# Patient Record
Sex: Male | Born: 1935
Health system: Southern US, Community
[De-identification: ages and names within clinical notes are randomized; demographics above are authoritative.]

## PROBLEM LIST (undated history)

## (undated) DIAGNOSIS — M1990 Unspecified osteoarthritis, unspecified site: Secondary | ICD-10-CM

## (undated) DIAGNOSIS — K5790 Diverticulosis of intestine, part unspecified, without perforation or abscess without bleeding: Secondary | ICD-10-CM

## (undated) DIAGNOSIS — I4891 Unspecified atrial fibrillation: Secondary | ICD-10-CM

## (undated) DIAGNOSIS — M545 Low back pain, unspecified: Secondary | ICD-10-CM

## (undated) DIAGNOSIS — H8110 Benign paroxysmal vertigo, unspecified ear: Secondary | ICD-10-CM

## (undated) DIAGNOSIS — J309 Allergic rhinitis, unspecified: Secondary | ICD-10-CM

## (undated) DIAGNOSIS — I6529 Occlusion and stenosis of unspecified carotid artery: Secondary | ICD-10-CM

## (undated) DIAGNOSIS — K219 Gastro-esophageal reflux disease without esophagitis: Secondary | ICD-10-CM

## (undated) DIAGNOSIS — Z7901 Long term (current) use of anticoagulants: Secondary | ICD-10-CM

## (undated) DIAGNOSIS — G4700 Insomnia, unspecified: Secondary | ICD-10-CM

## (undated) DIAGNOSIS — I10 Essential (primary) hypertension: Secondary | ICD-10-CM

## (undated) DIAGNOSIS — G8929 Other chronic pain: Secondary | ICD-10-CM

## (undated) DIAGNOSIS — H918X9 Other specified hearing loss, unspecified ear: Secondary | ICD-10-CM

## (undated) DIAGNOSIS — I4892 Unspecified atrial flutter: Secondary | ICD-10-CM

## (undated) DIAGNOSIS — I2510 Atherosclerotic heart disease of native coronary artery without angina pectoris: Secondary | ICD-10-CM

## (undated) DIAGNOSIS — I472 Ventricular tachycardia: Secondary | ICD-10-CM

## (undated) DIAGNOSIS — J189 Pneumonia, unspecified organism: Secondary | ICD-10-CM

## (undated) DIAGNOSIS — F528 Other sexual dysfunction not due to a substance or known physiological condition: Secondary | ICD-10-CM

## (undated) DIAGNOSIS — C439 Malignant melanoma of skin, unspecified: Secondary | ICD-10-CM

## (undated) DIAGNOSIS — E739 Lactose intolerance, unspecified: Secondary | ICD-10-CM

## (undated) DIAGNOSIS — F1011 Alcohol abuse, in remission: Secondary | ICD-10-CM

## (undated) DIAGNOSIS — E785 Hyperlipidemia, unspecified: Secondary | ICD-10-CM

## (undated) DIAGNOSIS — Z86010 Personal history of colonic polyps: Secondary | ICD-10-CM

## (undated) DIAGNOSIS — N400 Enlarged prostate without lower urinary tract symptoms: Secondary | ICD-10-CM

## (undated) DIAGNOSIS — Z950 Presence of cardiac pacemaker: Secondary | ICD-10-CM

## (undated) HISTORY — DX: Atherosclerotic heart disease of native coronary artery without angina pectoris: I25.10

## (undated) HISTORY — PX: TOTAL KNEE ARTHROPLASTY: SHX125

## (undated) HISTORY — DX: Essential (primary) hypertension: I10

## (undated) HISTORY — DX: Malignant melanoma of skin, unspecified: C43.9

## (undated) HISTORY — DX: Other sexual dysfunction not due to a substance or known physiological condition: F52.8

## (undated) HISTORY — PX: JOINT REPLACEMENT: SHX530

## (undated) HISTORY — DX: Unspecified atrial flutter: I48.92

## (undated) HISTORY — DX: Personal history of colonic polyps: Z86.010

## (undated) HISTORY — DX: Benign prostatic hyperplasia without lower urinary tract symptoms: N40.0

## (undated) HISTORY — DX: Ventricular tachycardia: I47.2

## (undated) HISTORY — PX: SKIN CANCER EXCISION: SHX779

## (undated) HISTORY — DX: Allergic rhinitis, unspecified: J30.9

## (undated) HISTORY — PX: TONSILLECTOMY: SUR1361

## (undated) HISTORY — DX: Long term (current) use of anticoagulants: Z79.01

## (undated) HISTORY — PX: KNEE ARTHROSCOPY: SHX127

## (undated) HISTORY — PX: MELANOMA EXCISION: SHX5266

## (undated) HISTORY — DX: Diverticulosis of intestine, part unspecified, without perforation or abscess without bleeding: K57.90

## (undated) HISTORY — DX: Hyperlipidemia, unspecified: E78.5

## (undated) HISTORY — PX: SHOULDER OPEN ROTATOR CUFF REPAIR: SHX2407

## (undated) HISTORY — DX: Other specified hearing loss, unspecified ear: H91.8X9

## (undated) HISTORY — DX: Lactose intolerance, unspecified: E73.9

## (undated) HISTORY — DX: Alcohol abuse, in remission: F10.11

## (undated) HISTORY — DX: Benign paroxysmal vertigo, unspecified ear: H81.10

## (undated) HISTORY — DX: Insomnia, unspecified: G47.00

## (undated) HISTORY — DX: Unspecified osteoarthritis, unspecified site: M19.90

## (undated) HISTORY — PX: CATARACT EXTRACTION W/ INTRAOCULAR LENS  IMPLANT, BILATERAL: SHX1307

## (undated) HISTORY — DX: Occlusion and stenosis of unspecified carotid artery: I65.29

---

## 1997-10-16 ENCOUNTER — Emergency Department (HOSPITAL_COMMUNITY): Admission: EM | Admit: 1997-10-16 | Discharge: 1997-10-16 | Payer: Self-pay | Admitting: Emergency Medicine

## 1998-06-15 ENCOUNTER — Inpatient Hospital Stay (HOSPITAL_COMMUNITY): Admission: EM | Admit: 1998-06-15 | Discharge: 1998-06-20 | Payer: Self-pay | Admitting: *Deleted

## 2001-06-29 ENCOUNTER — Emergency Department (HOSPITAL_COMMUNITY): Admission: EM | Admit: 2001-06-29 | Discharge: 2001-06-29 | Payer: Self-pay | Admitting: Emergency Medicine

## 2002-04-26 ENCOUNTER — Emergency Department (HOSPITAL_COMMUNITY): Admission: EM | Admit: 2002-04-26 | Discharge: 2002-04-26 | Payer: Self-pay | Admitting: *Deleted

## 2002-04-27 ENCOUNTER — Encounter: Payer: Self-pay | Admitting: Emergency Medicine

## 2002-05-12 ENCOUNTER — Encounter: Admission: RE | Admit: 2002-05-12 | Discharge: 2002-05-12 | Payer: Self-pay | Admitting: Internal Medicine

## 2002-05-12 ENCOUNTER — Encounter: Payer: Self-pay | Admitting: Internal Medicine

## 2002-06-23 ENCOUNTER — Ambulatory Visit (HOSPITAL_COMMUNITY): Admission: RE | Admit: 2002-06-23 | Discharge: 2002-06-23 | Payer: Self-pay | Admitting: *Deleted

## 2004-04-17 ENCOUNTER — Ambulatory Visit: Payer: Self-pay | Admitting: Internal Medicine

## 2004-05-29 ENCOUNTER — Ambulatory Visit: Payer: Self-pay | Admitting: Cardiology

## 2004-06-03 ENCOUNTER — Ambulatory Visit: Payer: Self-pay

## 2004-07-10 ENCOUNTER — Inpatient Hospital Stay (HOSPITAL_COMMUNITY): Admission: RE | Admit: 2004-07-10 | Discharge: 2004-07-13 | Payer: Self-pay | Admitting: Orthopaedic Surgery

## 2004-08-12 ENCOUNTER — Ambulatory Visit: Payer: Self-pay | Admitting: Internal Medicine

## 2004-08-13 ENCOUNTER — Ambulatory Visit: Payer: Self-pay | Admitting: Internal Medicine

## 2004-09-30 ENCOUNTER — Ambulatory Visit: Payer: Self-pay | Admitting: Internal Medicine

## 2004-10-03 ENCOUNTER — Ambulatory Visit: Payer: Self-pay | Admitting: Internal Medicine

## 2004-11-18 ENCOUNTER — Ambulatory Visit: Payer: Self-pay | Admitting: Internal Medicine

## 2004-12-10 ENCOUNTER — Ambulatory Visit: Payer: Self-pay | Admitting: Internal Medicine

## 2005-02-18 ENCOUNTER — Ambulatory Visit: Payer: Self-pay | Admitting: Internal Medicine

## 2005-03-10 ENCOUNTER — Ambulatory Visit: Payer: Self-pay | Admitting: Internal Medicine

## 2005-03-13 ENCOUNTER — Ambulatory Visit: Payer: Self-pay | Admitting: Internal Medicine

## 2005-11-06 ENCOUNTER — Ambulatory Visit: Payer: Self-pay | Admitting: Internal Medicine

## 2005-11-25 ENCOUNTER — Ambulatory Visit: Payer: Self-pay | Admitting: Internal Medicine

## 2005-12-03 ENCOUNTER — Encounter: Admission: RE | Admit: 2005-12-03 | Discharge: 2005-12-03 | Payer: Self-pay | Admitting: Internal Medicine

## 2005-12-06 ENCOUNTER — Encounter: Admission: RE | Admit: 2005-12-06 | Discharge: 2005-12-06 | Payer: Self-pay | Admitting: Internal Medicine

## 2005-12-30 ENCOUNTER — Ambulatory Visit: Payer: Self-pay | Admitting: Internal Medicine

## 2006-03-31 ENCOUNTER — Ambulatory Visit: Payer: Self-pay | Admitting: Internal Medicine

## 2006-04-01 ENCOUNTER — Ambulatory Visit: Payer: Self-pay | Admitting: Internal Medicine

## 2006-04-01 LAB — CONVERTED CEMR LAB
ALT: 17 units/L (ref 0–40)
Alkaline Phosphatase: 84 units/L (ref 39–117)
BUN: 9 mg/dL (ref 6–23)
Basophils Relative: 0.1 % (ref 0.0–1.0)
Bilirubin Urine: NEGATIVE
Bilirubin, Direct: 0.2 mg/dL (ref 0.0–0.3)
CO2: 30 meq/L (ref 19–32)
Creatinine, Ser: 0.8 mg/dL (ref 0.4–1.5)
Eosinophils Relative: 1.9 % (ref 0.0–5.0)
Glucose, Bld: 100 mg/dL — ABNORMAL HIGH (ref 70–99)
HCT: 41.9 % (ref 39.0–52.0)
Hemoglobin: 14.9 g/dL (ref 13.0–17.0)
Leukocytes, UA: NEGATIVE
Lymphocytes Relative: 44.5 % (ref 12.0–46.0)
Monocytes Absolute: 0.8 10*3/uL — ABNORMAL HIGH (ref 0.2–0.7)
Monocytes Relative: 9.1 % (ref 3.0–11.0)
Nitrite: NEGATIVE
PSA: 2.16 ng/mL
Potassium: 4.1 meq/L (ref 3.5–5.1)
RDW: 12.4 % (ref 11.5–14.6)
Specific Gravity, Urine: 1.015 (ref 1.000–1.03)
TSH: 1.01 microintl units/mL (ref 0.35–5.50)
Total Bilirubin: 1.1 mg/dL (ref 0.3–1.2)
Total Protein, Urine: NEGATIVE mg/dL
Total Protein: 6.8 g/dL (ref 6.0–8.3)
Urobilinogen, UA: 0.2 (ref 0.0–1.0)
VLDL: 12 mg/dL (ref 0–40)
WBC: 8.3 10*3/uL (ref 4.5–10.5)
pH: 6.5 (ref 5.0–8.0)

## 2006-09-29 ENCOUNTER — Ambulatory Visit: Payer: Self-pay | Admitting: Internal Medicine

## 2006-09-29 ENCOUNTER — Encounter: Payer: Self-pay | Admitting: Internal Medicine

## 2006-09-29 DIAGNOSIS — I472 Ventricular tachycardia, unspecified: Secondary | ICD-10-CM

## 2006-09-29 DIAGNOSIS — G4700 Insomnia, unspecified: Secondary | ICD-10-CM | POA: Insufficient documentation

## 2006-09-29 DIAGNOSIS — E785 Hyperlipidemia, unspecified: Secondary | ICD-10-CM

## 2006-09-29 DIAGNOSIS — I10 Essential (primary) hypertension: Secondary | ICD-10-CM

## 2006-09-29 DIAGNOSIS — N400 Enlarged prostate without lower urinary tract symptoms: Secondary | ICD-10-CM

## 2006-09-29 DIAGNOSIS — I4729 Other ventricular tachycardia: Secondary | ICD-10-CM

## 2006-09-29 DIAGNOSIS — J309 Allergic rhinitis, unspecified: Secondary | ICD-10-CM | POA: Insufficient documentation

## 2006-09-29 DIAGNOSIS — M1990 Unspecified osteoarthritis, unspecified site: Secondary | ICD-10-CM

## 2006-09-29 DIAGNOSIS — F528 Other sexual dysfunction not due to a substance or known physiological condition: Secondary | ICD-10-CM

## 2006-09-29 DIAGNOSIS — C439 Malignant melanoma of skin, unspecified: Secondary | ICD-10-CM | POA: Insufficient documentation

## 2006-09-29 HISTORY — DX: Benign prostatic hyperplasia without lower urinary tract symptoms: N40.0

## 2006-09-29 HISTORY — DX: Essential (primary) hypertension: I10

## 2006-09-29 HISTORY — DX: Malignant melanoma of skin, unspecified: C43.9

## 2006-09-29 HISTORY — DX: Unspecified osteoarthritis, unspecified site: M19.90

## 2006-09-29 HISTORY — DX: Allergic rhinitis, unspecified: J30.9

## 2006-09-29 HISTORY — DX: Other sexual dysfunction not due to a substance or known physiological condition: F52.8

## 2006-09-29 HISTORY — DX: Other ventricular tachycardia: I47.29

## 2006-09-29 HISTORY — DX: Insomnia, unspecified: G47.00

## 2006-09-29 HISTORY — DX: Ventricular tachycardia, unspecified: I47.20

## 2006-09-29 HISTORY — DX: Ventricular tachycardia: I47.2

## 2006-09-29 HISTORY — DX: Hyperlipidemia, unspecified: E78.5

## 2006-09-29 LAB — CONVERTED CEMR LAB
Total CHOL/HDL Ratio: 4.9
VLDL: 13 mg/dL (ref 0–40)

## 2006-12-10 ENCOUNTER — Ambulatory Visit: Payer: Self-pay | Admitting: Internal Medicine

## 2007-01-20 ENCOUNTER — Ambulatory Visit: Payer: Self-pay | Admitting: Internal Medicine

## 2007-04-01 ENCOUNTER — Ambulatory Visit: Payer: Self-pay | Admitting: Internal Medicine

## 2007-04-01 LAB — CONVERTED CEMR LAB
AST: 22 units/L (ref 0–37)
Bilirubin, Direct: 0.2 mg/dL (ref 0.0–0.3)
Chloride: 104 meq/L (ref 96–112)
Cholesterol: 136 mg/dL (ref 0–200)
Eosinophils Absolute: 0.1 10*3/uL (ref 0.0–0.6)
Eosinophils Relative: 1.3 % (ref 0.0–5.0)
GFR calc non Af Amer: 87 mL/min
Glucose, Bld: 105 mg/dL — ABNORMAL HIGH (ref 70–99)
HCT: 42.7 % (ref 39.0–52.0)
Hemoglobin: 14.2 g/dL (ref 13.0–17.0)
Lymphocytes Relative: 53.2 % — ABNORMAL HIGH (ref 12.0–46.0)
MCV: 94.6 fL (ref 78.0–100.0)
Neutro Abs: 2.1 10*3/uL (ref 1.4–7.7)
Neutrophils Relative %: 35.6 % — ABNORMAL LOW (ref 43.0–77.0)
Nitrite: NEGATIVE
PSA: 2.17 ng/mL (ref 0.10–4.00)
RBC: 4.52 M/uL (ref 4.22–5.81)
Sodium: 139 meq/L (ref 135–145)
Urobilinogen, UA: 0.2 (ref 0.0–1.0)
WBC: 6 10*3/uL (ref 4.5–10.5)

## 2007-04-08 ENCOUNTER — Ambulatory Visit: Payer: Self-pay | Admitting: Internal Medicine

## 2007-04-08 DIAGNOSIS — H8110 Benign paroxysmal vertigo, unspecified ear: Secondary | ICD-10-CM

## 2007-04-08 DIAGNOSIS — F1011 Alcohol abuse, in remission: Secondary | ICD-10-CM

## 2007-04-08 DIAGNOSIS — R42 Dizziness and giddiness: Secondary | ICD-10-CM | POA: Insufficient documentation

## 2007-04-08 HISTORY — DX: Benign paroxysmal vertigo, unspecified ear: H81.10

## 2007-04-08 HISTORY — DX: Alcohol abuse, in remission: F10.11

## 2007-06-01 ENCOUNTER — Ambulatory Visit: Payer: Self-pay | Admitting: Internal Medicine

## 2007-06-15 ENCOUNTER — Encounter: Payer: Self-pay | Admitting: Internal Medicine

## 2007-06-15 ENCOUNTER — Ambulatory Visit: Payer: Self-pay | Admitting: Internal Medicine

## 2007-06-15 LAB — HM COLONOSCOPY: HM Colonoscopy: ABNORMAL

## 2007-06-23 ENCOUNTER — Ambulatory Visit: Payer: Self-pay | Admitting: Internal Medicine

## 2007-06-23 DIAGNOSIS — Z860100 Personal history of colon polyps, unspecified: Secondary | ICD-10-CM | POA: Insufficient documentation

## 2007-06-23 DIAGNOSIS — J019 Acute sinusitis, unspecified: Secondary | ICD-10-CM

## 2007-06-23 HISTORY — DX: Personal history of colon polyps, unspecified: Z86.0100

## 2007-06-23 HISTORY — DX: Personal history of colonic polyps: Z86.010

## 2007-11-02 ENCOUNTER — Ambulatory Visit: Payer: Self-pay | Admitting: Internal Medicine

## 2007-11-02 DIAGNOSIS — H9190 Unspecified hearing loss, unspecified ear: Secondary | ICD-10-CM | POA: Insufficient documentation

## 2007-12-27 ENCOUNTER — Telehealth (INDEPENDENT_AMBULATORY_CARE_PROVIDER_SITE_OTHER): Payer: Self-pay | Admitting: *Deleted

## 2008-01-07 ENCOUNTER — Ambulatory Visit: Payer: Self-pay | Admitting: Internal Medicine

## 2008-03-06 ENCOUNTER — Inpatient Hospital Stay (HOSPITAL_COMMUNITY): Admission: EM | Admit: 2008-03-06 | Discharge: 2008-03-07 | Payer: Self-pay | Admitting: Emergency Medicine

## 2008-03-06 ENCOUNTER — Ambulatory Visit: Payer: Self-pay | Admitting: *Deleted

## 2008-03-14 ENCOUNTER — Ambulatory Visit: Payer: Self-pay | Admitting: Internal Medicine

## 2008-03-14 DIAGNOSIS — H9209 Otalgia, unspecified ear: Secondary | ICD-10-CM | POA: Insufficient documentation

## 2008-03-15 ENCOUNTER — Ambulatory Visit: Payer: Self-pay | Admitting: Cardiology

## 2008-03-15 ENCOUNTER — Ambulatory Visit: Payer: Self-pay

## 2008-03-15 ENCOUNTER — Ambulatory Visit: Payer: Self-pay | Admitting: Internal Medicine

## 2008-03-26 ENCOUNTER — Ambulatory Visit: Payer: Self-pay | Admitting: *Deleted

## 2008-03-26 ENCOUNTER — Emergency Department (HOSPITAL_COMMUNITY): Admission: EM | Admit: 2008-03-26 | Discharge: 2008-03-26 | Payer: Self-pay | Admitting: Emergency Medicine

## 2008-03-27 LAB — CONVERTED CEMR LAB
ALT: 19 units/L (ref 0–53)
AST: 22 units/L (ref 0–37)
Basophils Relative: 0 % (ref 0.0–3.0)
CO2: 30 meq/L (ref 19–32)
Calcium: 9.3 mg/dL (ref 8.4–10.5)
Chloride: 101 meq/L (ref 96–112)
Creatinine, Ser: 1 mg/dL (ref 0.4–1.5)
Eosinophils Relative: 1.7 % (ref 0.0–5.0)
Glucose, Bld: 109 mg/dL — ABNORMAL HIGH (ref 70–99)
Hemoglobin: 15.8 g/dL (ref 13.0–17.0)
Ketones, ur: NEGATIVE mg/dL
Leukocytes, UA: NEGATIVE
Lymphocytes Relative: 47.6 % — ABNORMAL HIGH (ref 12.0–46.0)
Monocytes Relative: 11.9 % (ref 3.0–12.0)
Neutro Abs: 2.5 10*3/uL (ref 1.4–7.7)
Nitrite: NEGATIVE
PSA: 2.06 ng/mL (ref 0.10–4.00)
RBC: 4.78 M/uL (ref 4.22–5.81)
Specific Gravity, Urine: 1.015 (ref 1.000–1.03)
TSH: 0.74 microintl units/mL (ref 0.35–5.50)
Total CHOL/HDL Ratio: 2.5
Total Protein: 7.5 g/dL (ref 6.0–8.3)
Triglycerides: 44 mg/dL (ref 0–149)
WBC: 6.5 10*3/uL (ref 4.5–10.5)
pH: 6 (ref 5.0–8.0)

## 2008-03-30 ENCOUNTER — Ambulatory Visit: Payer: Self-pay | Admitting: Internal Medicine

## 2008-04-05 ENCOUNTER — Encounter: Payer: Self-pay | Admitting: Internal Medicine

## 2008-04-05 ENCOUNTER — Ambulatory Visit: Payer: Self-pay

## 2008-04-05 ENCOUNTER — Ambulatory Visit: Payer: Self-pay | Admitting: Internal Medicine

## 2008-04-06 ENCOUNTER — Ambulatory Visit: Payer: Self-pay | Admitting: Internal Medicine

## 2008-04-11 ENCOUNTER — Ambulatory Visit: Payer: Self-pay | Admitting: Cardiology

## 2008-04-27 ENCOUNTER — Encounter: Payer: Self-pay | Admitting: Internal Medicine

## 2008-04-27 ENCOUNTER — Ambulatory Visit: Payer: Self-pay | Admitting: Internal Medicine

## 2008-05-04 ENCOUNTER — Ambulatory Visit: Payer: Self-pay | Admitting: Cardiovascular Disease

## 2008-05-11 ENCOUNTER — Ambulatory Visit: Payer: Self-pay | Admitting: Cardiology

## 2008-05-18 ENCOUNTER — Ambulatory Visit: Payer: Self-pay | Admitting: Internal Medicine

## 2008-05-23 ENCOUNTER — Ambulatory Visit: Payer: Self-pay | Admitting: Internal Medicine

## 2008-05-23 ENCOUNTER — Ambulatory Visit: Payer: Self-pay | Admitting: Cardiology

## 2008-05-26 LAB — CONVERTED CEMR LAB
BUN: 13 mg/dL (ref 6–23)
Basophils Absolute: 0 10*3/uL (ref 0.0–0.1)
Basophils Relative: 0.4 % (ref 0.0–3.0)
CO2: 31 meq/L (ref 19–32)
Calcium: 9.3 mg/dL (ref 8.4–10.5)
Creatinine, Ser: 0.9 mg/dL (ref 0.4–1.5)
Eosinophils Absolute: 0.1 10*3/uL (ref 0.0–0.7)
INR: 2.5 — ABNORMAL HIGH (ref 0.8–1.0)
Lymphocytes Relative: 52.4 % — ABNORMAL HIGH (ref 12.0–46.0)
MCHC: 33.4 g/dL (ref 30.0–36.0)
Monocytes Absolute: 0.7 10*3/uL (ref 0.1–1.0)
Neutrophils Relative %: 33.7 % — ABNORMAL LOW (ref 43.0–77.0)
Platelets: 215 10*3/uL (ref 150.0–400.0)
Prothrombin Time: 26 s — ABNORMAL HIGH (ref 10.9–13.3)
RBC: 4.76 M/uL (ref 4.22–5.81)
aPTT: 38.1 s — ABNORMAL HIGH (ref 21.7–28.8)

## 2008-05-30 ENCOUNTER — Ambulatory Visit (HOSPITAL_COMMUNITY): Admission: RE | Admit: 2008-05-30 | Discharge: 2008-05-31 | Payer: Self-pay | Admitting: Internal Medicine

## 2008-05-30 ENCOUNTER — Ambulatory Visit: Payer: Self-pay | Admitting: Internal Medicine

## 2008-06-01 ENCOUNTER — Telehealth: Payer: Self-pay | Admitting: Internal Medicine

## 2008-06-02 ENCOUNTER — Telehealth: Payer: Self-pay | Admitting: Internal Medicine

## 2008-06-09 ENCOUNTER — Telehealth: Payer: Self-pay | Admitting: Internal Medicine

## 2008-06-12 ENCOUNTER — Ambulatory Visit: Payer: Self-pay | Admitting: Internal Medicine

## 2008-06-12 ENCOUNTER — Ambulatory Visit: Payer: Self-pay | Admitting: Cardiovascular Disease

## 2008-07-04 ENCOUNTER — Ambulatory Visit: Payer: Self-pay | Admitting: Internal Medicine

## 2008-07-04 DIAGNOSIS — R079 Chest pain, unspecified: Secondary | ICD-10-CM | POA: Insufficient documentation

## 2008-07-05 ENCOUNTER — Encounter: Payer: Self-pay | Admitting: *Deleted

## 2008-07-05 ENCOUNTER — Telehealth (INDEPENDENT_AMBULATORY_CARE_PROVIDER_SITE_OTHER): Payer: Self-pay | Admitting: *Deleted

## 2008-07-10 ENCOUNTER — Ambulatory Visit: Payer: Self-pay | Admitting: Cardiology

## 2008-08-08 ENCOUNTER — Ambulatory Visit: Payer: Self-pay | Admitting: Cardiology

## 2008-08-08 ENCOUNTER — Encounter (INDEPENDENT_AMBULATORY_CARE_PROVIDER_SITE_OTHER): Payer: Self-pay | Admitting: Cardiology

## 2008-08-08 LAB — CONVERTED CEMR LAB: Prothrombin Time: 17.9 s

## 2008-08-09 ENCOUNTER — Encounter: Payer: Self-pay | Admitting: *Deleted

## 2008-09-05 ENCOUNTER — Ambulatory Visit: Payer: Self-pay | Admitting: Cardiology

## 2008-09-05 LAB — CONVERTED CEMR LAB: POC INR: 4.2

## 2008-09-11 ENCOUNTER — Telehealth: Payer: Self-pay | Admitting: Internal Medicine

## 2008-09-12 ENCOUNTER — Ambulatory Visit: Payer: Self-pay | Admitting: Internal Medicine

## 2008-09-12 ENCOUNTER — Encounter: Payer: Self-pay | Admitting: Internal Medicine

## 2008-09-12 DIAGNOSIS — M549 Dorsalgia, unspecified: Secondary | ICD-10-CM | POA: Insufficient documentation

## 2008-09-19 ENCOUNTER — Ambulatory Visit: Payer: Self-pay | Admitting: Cardiovascular Disease

## 2008-09-19 LAB — CONVERTED CEMR LAB: POC INR: 3

## 2008-10-02 ENCOUNTER — Ambulatory Visit: Payer: Self-pay | Admitting: Internal Medicine

## 2008-10-02 DIAGNOSIS — M79609 Pain in unspecified limb: Secondary | ICD-10-CM

## 2008-10-02 LAB — CONVERTED CEMR LAB
Basophils Absolute: 0 10*3/uL (ref 0.0–0.1)
Basophils Relative: 0.1 % (ref 0.0–3.0)
Eosinophils Relative: 1.1 % (ref 0.0–5.0)
HCT: 40.4 % (ref 39.0–52.0)
Hemoglobin: 14.1 g/dL (ref 13.0–17.0)
Lymphocytes Relative: 40 % (ref 12.0–46.0)
Lymphs Abs: 3 10*3/uL (ref 0.7–4.0)
Monocytes Relative: 9.5 % (ref 3.0–12.0)
Neutro Abs: 3.8 10*3/uL (ref 1.4–7.7)
RBC: 4.28 M/uL (ref 4.22–5.81)
WBC: 7.6 10*3/uL (ref 4.5–10.5)

## 2008-10-05 ENCOUNTER — Telehealth (INDEPENDENT_AMBULATORY_CARE_PROVIDER_SITE_OTHER): Payer: Self-pay | Admitting: Pharmacist

## 2008-10-10 ENCOUNTER — Ambulatory Visit: Payer: Self-pay | Admitting: Cardiology

## 2008-11-07 ENCOUNTER — Ambulatory Visit: Payer: Self-pay | Admitting: Cardiology

## 2008-11-21 ENCOUNTER — Ambulatory Visit: Payer: Self-pay | Admitting: Cardiology

## 2008-11-21 LAB — CONVERTED CEMR LAB: POC INR: 2.4

## 2008-12-19 ENCOUNTER — Ambulatory Visit: Payer: Self-pay | Admitting: Cardiology

## 2008-12-19 LAB — CONVERTED CEMR LAB: POC INR: 2.5

## 2009-01-16 ENCOUNTER — Ambulatory Visit: Payer: Self-pay | Admitting: Cardiology

## 2009-01-16 LAB — CONVERTED CEMR LAB: POC INR: 2.2

## 2009-02-16 ENCOUNTER — Ambulatory Visit: Payer: Self-pay | Admitting: Internal Medicine

## 2009-03-15 ENCOUNTER — Ambulatory Visit: Payer: Self-pay | Admitting: Cardiology

## 2009-03-15 LAB — CONVERTED CEMR LAB: POC INR: 2.7

## 2009-03-16 ENCOUNTER — Ambulatory Visit: Payer: Self-pay | Admitting: Internal Medicine

## 2009-03-16 DIAGNOSIS — L989 Disorder of the skin and subcutaneous tissue, unspecified: Secondary | ICD-10-CM | POA: Insufficient documentation

## 2009-03-16 LAB — CONVERTED CEMR LAB
AST: 27 units/L (ref 0–37)
Alkaline Phosphatase: 98 units/L (ref 39–117)
Basophils Absolute: 0.1 10*3/uL (ref 0.0–0.1)
Basophils Relative: 1 % (ref 0.0–3.0)
Bilirubin Urine: NEGATIVE
Bilirubin, Direct: 0.2 mg/dL (ref 0.0–0.3)
CO2: 31 meq/L (ref 19–32)
Calcium: 9.6 mg/dL (ref 8.4–10.5)
Creatinine, Ser: 0.9 mg/dL (ref 0.4–1.5)
Eosinophils Absolute: 0.1 10*3/uL (ref 0.0–0.7)
GFR calc non Af Amer: 86.49 mL/min (ref >60)
HDL: 56 mg/dL (ref >39.00)
Hemoglobin, Urine: NEGATIVE
Hemoglobin: 15.2 g/dL (ref 13.0–17.0)
Ketones, ur: NEGATIVE mg/dL
LDL Cholesterol: 59 mg/dL (ref 0–99)
Lymphocytes Relative: 55.9 % — ABNORMAL HIGH (ref 12.0–46.0)
MCHC: 32.9 g/dL (ref 30.0–36.0)
Monocytes Relative: 8.5 % (ref 3.0–12.0)
Neutrophils Relative %: 33.1 % — ABNORMAL LOW (ref 43.0–77.0)
PSA: 1.96 ng/mL (ref 0.10–4.00)
RBC: 4.76 M/uL (ref 4.22–5.81)
RDW: 12.6 % (ref 11.5–14.6)
Sodium: 139 meq/L (ref 135–145)
TSH: 0.61 microintl units/mL (ref 0.35–5.50)
Total CHOL/HDL Ratio: 2
Urine Glucose: NEGATIVE mg/dL
Urobilinogen, UA: 0.2 (ref 0.0–1.0)
VLDL: 10.6 mg/dL (ref 0.0–40.0)

## 2009-04-12 ENCOUNTER — Ambulatory Visit: Payer: Self-pay | Admitting: Cardiology

## 2009-04-30 ENCOUNTER — Ambulatory Visit: Payer: Self-pay | Admitting: Internal Medicine

## 2009-05-10 ENCOUNTER — Ambulatory Visit: Payer: Self-pay | Admitting: Cardiovascular Disease

## 2009-05-10 LAB — CONVERTED CEMR LAB: POC INR: 2.7

## 2009-06-07 ENCOUNTER — Ambulatory Visit: Payer: Self-pay | Admitting: Cardiology

## 2009-06-30 ENCOUNTER — Encounter: Payer: Self-pay | Admitting: Internal Medicine

## 2009-06-30 ENCOUNTER — Encounter: Admission: RE | Admit: 2009-06-30 | Discharge: 2009-06-30 | Payer: Self-pay | Admitting: Orthopedic Surgery

## 2009-07-03 ENCOUNTER — Encounter: Payer: Self-pay | Admitting: Internal Medicine

## 2009-07-03 ENCOUNTER — Ambulatory Visit: Payer: Self-pay | Admitting: Internal Medicine

## 2009-07-31 ENCOUNTER — Ambulatory Visit: Payer: Self-pay | Admitting: Internal Medicine

## 2009-07-31 LAB — CONVERTED CEMR LAB: POC INR: 1.9

## 2009-08-10 ENCOUNTER — Ambulatory Visit: Payer: Self-pay | Admitting: Internal Medicine

## 2009-08-10 DIAGNOSIS — H918X9 Other specified hearing loss, unspecified ear: Secondary | ICD-10-CM

## 2009-08-10 HISTORY — DX: Other specified hearing loss, unspecified ear: H91.8X9

## 2009-08-24 ENCOUNTER — Ambulatory Visit: Payer: Self-pay | Admitting: Cardiology

## 2009-08-24 LAB — CONVERTED CEMR LAB: POC INR: 2

## 2009-09-05 ENCOUNTER — Telehealth: Payer: Self-pay | Admitting: Internal Medicine

## 2009-09-21 ENCOUNTER — Ambulatory Visit: Payer: Self-pay | Admitting: Cardiology

## 2009-09-21 LAB — CONVERTED CEMR LAB: POC INR: 2.5

## 2009-10-19 ENCOUNTER — Ambulatory Visit: Payer: Self-pay | Admitting: Cardiology

## 2009-10-19 LAB — CONVERTED CEMR LAB: POC INR: 2.9

## 2009-11-16 ENCOUNTER — Ambulatory Visit: Payer: Self-pay | Admitting: Cardiology

## 2009-11-16 ENCOUNTER — Ambulatory Visit: Payer: Self-pay | Admitting: Internal Medicine

## 2009-11-16 DIAGNOSIS — R5383 Other fatigue: Secondary | ICD-10-CM | POA: Insufficient documentation

## 2009-11-16 DIAGNOSIS — R5381 Other malaise: Secondary | ICD-10-CM

## 2009-11-16 LAB — CONVERTED CEMR LAB
ALT: 28 units/L (ref 0–53)
Alkaline Phosphatase: 108 units/L (ref 39–117)
BUN: 11 mg/dL (ref 6–23)
Basophils Absolute: 0 10*3/uL (ref 0.0–0.1)
Bilirubin, Direct: 0.1 mg/dL (ref 0.0–0.3)
GFR calc non Af Amer: 94.8 mL/min (ref >60)
Glucose, Bld: 99 mg/dL (ref 70–99)
HCT: 45.5 % (ref 39.0–52.0)
Lymphs Abs: 3.8 10*3/uL (ref 0.7–4.0)
MCV: 94.6 fL (ref 78.0–100.0)
Monocytes Absolute: 0.9 10*3/uL (ref 0.1–1.0)
Monocytes Relative: 11.5 % (ref 3.0–12.0)
POC INR: 3.5
Platelets: 251 10*3/uL (ref 150.0–400.0)
Potassium: 5.2 meq/L — ABNORMAL HIGH (ref 3.5–5.1)
RDW: 13.7 % (ref 11.5–14.6)
Sed Rate: 7 mm/hr (ref 0–22)
Total Bilirubin: 0.9 mg/dL (ref 0.3–1.2)
VLDL: 16.8 mg/dL (ref 0.0–40.0)

## 2009-12-07 ENCOUNTER — Ambulatory Visit: Payer: Self-pay | Admitting: Cardiology

## 2009-12-21 ENCOUNTER — Telehealth: Payer: Self-pay | Admitting: Internal Medicine

## 2010-01-04 ENCOUNTER — Ambulatory Visit: Payer: Self-pay | Admitting: Cardiovascular Disease

## 2010-01-08 ENCOUNTER — Ambulatory Visit: Payer: Self-pay | Admitting: Internal Medicine

## 2010-01-09 LAB — CONVERTED CEMR LAB
CO2: 30 meq/L (ref 19–32)
Calcium: 9.4 mg/dL (ref 8.4–10.5)
Creatinine, Ser: 0.8 mg/dL (ref 0.4–1.5)
Glucose, Bld: 146 mg/dL — ABNORMAL HIGH (ref 70–99)

## 2010-01-29 ENCOUNTER — Ambulatory Visit
Admission: RE | Admit: 2010-01-29 | Discharge: 2010-01-29 | Payer: Self-pay | Source: Home / Self Care | Attending: Internal Medicine | Admitting: Internal Medicine

## 2010-01-29 DIAGNOSIS — A088 Other specified intestinal infections: Secondary | ICD-10-CM | POA: Insufficient documentation

## 2010-02-01 ENCOUNTER — Ambulatory Visit: Payer: Self-pay | Admitting: Cardiovascular Disease

## 2010-02-01 LAB — CONVERTED CEMR LAB: POC INR: 2.1

## 2010-03-01 ENCOUNTER — Ambulatory Visit: Admission: RE | Admit: 2010-03-01 | Discharge: 2010-03-01 | Payer: Self-pay | Source: Home / Self Care

## 2010-03-01 LAB — CONVERTED CEMR LAB: POC INR: 2.4

## 2010-03-07 NOTE — Medication Information (Signed)
Summary: rov/tm  Anticoagulant Therapy  Managed by: Cloyde Reams, RN, BSN Referring MD: Sherryl Manges MD PCP: Dr.Zenas Supervising MD: Jens Som MD, Arlys Jodie Indication 1: Atrial Fibrillation (ICD-427.31) Lab Used: [facility] Site: [facility] INR POC 2.0 INR RANGE 2 - 3    Bleeding/hemorrhagic complications: no          Allergies: 1)  Ace Inhibitors 2)  Amiodarone Hcl  Anticoagulation Management History:      The patient is taking warfarin and comes in today for a routine follow up visit.  Positive risk factors for bleeding include an age of 75 years or older.  The bleeding index is 'intermediate risk'.  Positive CHADS2 values include History of HTN and Age > 45 years old.  The start date was 03/06/2008.  His last INR was 2.5 ratio.  Anticoagulation responsible provider: Jens Som MD, Arlys Adison.  INR POC: 2.0.  Cuvette Lot#: [id number].  Exp: 10/2010.    Anticoagulation Management Assessment/Plan:      The patient's current anticoagulation dose is Warfarin sodium 5 mg tabs: Take as directed by coumadin clinic..  The target INR is 2 - 3.  The next INR is due 09/21/2009.  Anticoagulation instructions were given to patient.  Results were reviewed/authorized by Cloyde Reams, RN, BSN.  He was notified by Cloyde Reams RN.         Prior Anticoagulation Instructions: INR 1.9 Today take 1 1/2 pills then resume 1 pill everyday except 1 1/2 pills on Mondays. Recheck in  4 weeks.   Current Anticoagulation Instructions: INR 2.0  Start taking 1 tablet daily except 1.5 tablets on Mondays and Fridays.  Recheck in 4 weeks.

## 2010-03-07 NOTE — Assessment & Plan Note (Signed)
Summary: per check ou t/sf   Referring Provider:  Berton Mount, MD Primary Provider:  Dr.Kery  CC:  pt follow up.   [name]  History of Present Illness:    [name]  is seen in followup for a right ventricular outflow tract ventricular ectopy associated with palpitations. He also has  atypical atrial flutter which at EP study was assoicated with flutters induced with variable morpholgies and cycle lengths.  As these arrythmias frequently changed from one to the next, 3 D mapping could not be performed of a stable tachycardia.  He also had frequent organized afib during the study with coarse fib waves.  As the patient reports minimal symptoms with these arrhythmias, he has presently declined anti-arrhythmic medicines.  The patient denies SOB, chest pain, edema or palpitations       Current Medications (verified): 1)  Diovan 80 Mg Tabs (Valsartan) .[name].. 1po Once Daily 2)  Simvastatin 40 Mg  Tabs (Simvastatin) .[name].. 1po Once Daily 3)  Multivitamins   Tabs (Multiple Vitamin) .... Take 1 Tablet By Mouth Once A Day 4)  Calcium 600 Mg  Tabs (Calcium) .... Take 1 Tablet By Mouth Once A Day 5)  Vitamin C 500 Mg  Tabs (Ascorbic Acid) .... Take 1 Tablet By Mouth Once A Day 6)  Cardizem La 360 Mg Xr24h-Tab (Diltiazem Hcl Coated Beads) .[name].. 1 By Mouth Once Daily 7)  Warfarin Sodium 5 Mg Tabs (Warfarin Sodium) .... Take As Directed By Coumadin Clinic. 8)  Hydrocodone-Acetaminophen 5-325 Mg Tabs (Hydrocodone-Acetaminophen) .[name].. 1 By Mouth Once Daily As Needed Pain 9)  Flomax 0.4 Mg Caps (Tamsulosin Hcl) .... Generic - 1 By Mouth Once Daily-Pt Not Going To Continue  Allergies (verified): 1)  Ace Inhibitors 2)  Amiodarone Hcl  Past History:  Past Medical History: Last updated: 06/12/2008 ATYPICAL ATRIAL FLUTTER ATRIAL FIBRILLATION (ICD-427.31) BACK PAIN (ICD-724.5) UNSPECIFIED HEARING LOSS (ICD-389.9) SINUSITIS- ACUTE-NOS (ICD-461.9) COLONIC POLYPS, HX OF (ICD-V12.72) ABUSE, ALCOHOL, IN REMISSION  (ICD-305.03) ERECTILE DYSFUNCTION (ICD-302.72) BENIGN PROSTATIC HYPERTROPHY (ICD-600.00) ALLERGIC RHINITIS (ICD-477.9) MELANOMA, MALIGNANT, SKIN NOS (ICD-172.9) OSTEOARTHROSIS NOS, OTHER SPEC SITE (ICD-715.98) VENTRICULAR TACHYCARDIA (ICD-427.1) HYPERTENSION (ICD-401.9) HYPERLIPIDEMIA (ICD-272.4)  Past Surgical History: Last updated: 03/14/2008 Rotator cuff repair - left Total knee replacement - right Tonsillectomy s/p left knee arthrosocopy  Family History: Last updated: 04/26/2008 brother with tachyarrythmia, DM father with esophageal cancer mother with chf brother with dialysis/bright disease  He has a brother and sister who both have arrhythmias.   His brother has been ablated and his sister had syncope.   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  He is retired from   Airline pilot.  He quit smoking 20 years ago.  He quit drinking 20 years ago.   No drugs.   Vital Signs:  Patient profile:   77 year old male Height:      72 inches Weight:      186 pounds BMI:     25.32 Pulse rate:   70 / minute Pulse rhythm:   regular BP sitting:   150 / 76  (left arm) Cuff size:   regular  Vitals Entered By: Judithe Modest CMA (April 30, 2009 11:48 AM)  Physical Exam  General:  The patient was alert and oriented in no acute distress. HEENT Normal.  Neck veins were flat, carotids were brisk.  Lungs were clear.  Heart sounds were regular without murmurs or gallops.  Abdomen was soft with active bowel  sounds. There is no clubbing cyanosis or edema. Skin Warm and dry    Impression & Recommendations:  Problem # 1:  ATRIAL FLUTTERS -ATYPICAL (ICD-427.31) the patient is maintained on warfarin. He has no associated symptoms .he also takes Cardizem for rate control His updated medication list for this problem includes:    Warfarin Sodium 5 Mg Tabs (Warfarin sodium) .[name]... Take as  directed by coumadin clinic.  Problem # 2:  HYPERTENSION (ICD-401.9) His blood pressure is elevated here; however, at home it runs in the 120 range no medication adjustments will be made His updated medication list for this problem includes:    Diovan 80 Mg Tabs (Valsartan) .[name]... 1po once daily    Cardizem La 360 Mg Xr24h-tab (Diltiazem hcl coated beads) .[name]... 1 by mouth once daily

## 2010-03-07 NOTE — Medication Information (Signed)
Summary: rov/ewj  Anticoagulant Therapy  Managed by: Bethena Midget, RN, BSN Referring MD: Sherryl Manges MD PCP: Dr.Carole Supervising MD: Daleen Squibb MD, Maisie Fus Indication 1: Atrial Fibrillation (ICD-427.31) Lab Used: [facility] Site: [facility] INR POC 2.3 INR RANGE 2 - 3  Dietary changes: no    Health status changes: no    Bleeding/hemorrhagic complications: no    Recent/future hospitalizations: no    Any changes in medication regimen? yes       Details: Started Flomax last week.   Recent/future dental: no  Any missed doses?: no       Is patient compliant with meds? yes       Allergies: 1)  Ace Inhibitors 2)  Amiodarone Hcl  Anticoagulation Management History:      The patient is taking warfarin and comes in today for a routine follow up visit.  Positive risk factors for bleeding include an age of 75 years or older.  The bleeding index is 'intermediate risk'.  Positive CHADS2 values include History of HTN and Age > 57 years old.  The start date was 03/06/2008.  His last INR was 2.5 ratio.  Anticoagulation responsible provider: Daleen Squibb MD, Maisie Fus.  INR POC: 2.3.  Cuvette Lot#: [id number].  Exp: 06/2010.    Anticoagulation Management Assessment/Plan:      The patient's current anticoagulation dose is Warfarin sodium 5 mg tabs: Take as directed by coumadin clinic..  The target INR is 2 - 3.  The next INR is due 05/10/2009.  Anticoagulation instructions were given to patient.  Results were reviewed/authorized by Bethena Midget, RN, BSN.  He was notified by Bethena Midget, RN, BSN.         Prior Anticoagulation Instructions: INR 2.7  Continue on same dosage 5mg  daily except 7.5mg  on Mondays.  Recheck in 4 weeks.    Current Anticoagulation Instructions: INR 2.3 Continue 5mg s daily except 7.5mg s on Mondays. Recheck in 4 weeks.

## 2010-03-07 NOTE — Medication Information (Signed)
Summary: rov/ewj  Anticoagulant Therapy  Managed by: Weston Brass, PharmD Referring MD: Sherryl Manges MD PCP: Dr.Zyad Supervising MD: Myrtis Ser MD, Tinnie Gens Indication 1: Atrial Fibrillation (ICD-427.31) Lab Used: [facility] Site: [facility] INR POC 2.9 INR RANGE 2 - 3  Dietary changes: no    Health status changes: no    Bleeding/hemorrhagic complications: no    Recent/future hospitalizations: no    Any changes in medication regimen? no    Recent/future dental: no  Any missed doses?: no       Is patient compliant with meds? yes       Allergies: 1)  Ace Inhibitors 2)  Amiodarone Hcl  Anticoagulation Management History:      The patient is taking warfarin and comes in today for a routine follow up visit.  Positive risk factors for bleeding include an age of 75 years or older.  The bleeding index is 'intermediate risk'.  Positive CHADS2 values include History of HTN and Age > 57 years old.  The start date was 03/06/2008.  His last INR was 2.5 ratio.  Anticoagulation responsible provider: Myrtis Ser MD, Tinnie Gens.  INR POC: 2.9.  Cuvette Lot#: [id number].  Exp: 12/2010.    Anticoagulation Management Assessment/Plan:      The patient's current anticoagulation dose is Warfarin sodium 5 mg tabs: Take as directed by coumadin clinic..  The target INR is 2 - 3.  The next INR is due 11/16/2009.  Anticoagulation instructions were given to patient.  Results were reviewed/authorized by Weston Brass, PharmD.  He was notified by Harrel Carina, PharmD candidate.         Prior Anticoagulation Instructions: INR 2.5  Continue on same dosage 1 tablet daily except 1.5 tablets on Mondays and Fridays.  Recheck in 4 weeks.    Current Anticoagulation Instructions: INR 2.9  Continue taking 1 tablet everyday except 1 1/2 tablets on Mondays and Fridays. Re-check INR in 4 weeks.

## 2010-03-07 NOTE — Assessment & Plan Note (Signed)
Summary: fever/ dizzy/ not eating/nws   Vital Signs:  Patient profile:   77 year old male Height:      71 inches Weight:      188 pounds BMI:     26.32 O2 Sat:      96 % on Room air Temp:     98.2 degrees F oral Pulse rate:   70 / minute BP sitting:   110 / 68  (left arm) Cuff size:   regular  Vitals Entered By: Zella Ball Ewing CMA Duncan Dull) (January 29, 2010 1:42 PM)  O2 Flow:  Room air CC: Fever, dizziness, no appetite and chills/RE   Primary Care [place] Bord:  Dr.Layla  CC:  Fever, dizziness, and no appetite and chills/RE.  History of Present Illness: here with acute onset illness x 2 days, seemed to start with feeling chills on xmas day, then aches to the arms, upper back, (no neck pain) but with headache, dizziness and some increased hearing loss the ast 2 dyas;  no overt sinus symptoms or ST, no cough and Pt denies CP, worsening sob, doe, wheezing, orthopnea, pnd, worsening LE edema, palps, dizziness or syncope  2Tylenol and 1 pain pilo helped this am.  Had some mild nausea, decrased appetitie, no vomiting; does have dry mouth, and crampy pains nad sharp pains and loose stools up to 3 to 4 per day.  No blood.  Pt denies CP, worsening sob, doe, wheezing, orthopnea, pnd, worsening LE edema, palps, syncope.  Pt denies new neuro symptoms such as  facial or extremity weakness  Pt denies polydipsia, polyuria,   Overall good compliance with meds, trying to follow low chol diet, wt stable, little excercise however, and tyring to drink more fluids as he has been ill.  BP some lower than usual for him.    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) 9)  Hypertension  (ICD-401.9) 10)  Back Pain   (ICD-724.5) 11)  Chest Pain  (ICD-786.50) 12)  Ear Pain, Left  (ICD-388.70) 13)  Preventive Health Care  (ICD-V70.0) 14)  Unspecified Hearing Loss  (ICD-389.9) 15)  Sinusitis- Acute-nos  (ICD-461.9) 16)  Colonic Polyps, Hx of  (ICD-V12.72) 17)  Abuse, Alcohol, 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 Take 1 Tablet By Mouth Once A Day 2)  Calcium 600 Mg  Tabs (Calcium) .... Take 1 Tablet By Mouth Once A Day 3)  Vitamin C 500 Mg  Tabs (Ascorbic Acid) .... Take 1 Tablet By Mouth Once A Day 4)  Cardizem La 360 Mg Xr24h-Tab (Diltiazem Hcl Coated Beads) .[name].. 1 By Mouth Once Daily 5)  Warfarin Sodium 5 Mg Tabs (Warfarin Sodium) .... Take As Directed By Coumadin Clinic. 6)  Hydrocodone-Acetaminophen 5-325 Mg Tabs (Hydrocodone-Acetaminophen) .[name].. 1 By Mouth Once  Daily As Needed Pain 7)  Flomax 0.4 Mg Caps (Tamsulosin Hcl) .... Generic - 1 By Mouth Once Daily-Pt Not Going To Continue 8)  Zocor 20 Mg Tabs (Simvastatin) .[name].. 1 By Mouth Once Daily 9)  Diovan 80 Mg Tabs (Valsartan) .... Take One Tablet By Mouth Daily  Current Medications (verified): 1)  Multivitamins   Tabs (Multiple Vitamin) .... Take 1 Tablet By Mouth Once A Day 2)  Calcium 600 Mg  Tabs (Calcium) .... Take 1 Tablet By Mouth Once A Day 3)  Vitamin C 500 Mg  Tabs (Ascorbic Acid) .... Take 1 Tablet By Mouth Once A Day 4)  Cardizem La 360 Mg Xr24h-Tab (Diltiazem Hcl Coated Beads) .[name].. 1 By Mouth Once Daily 5)  Warfarin Sodium 5 Mg Tabs (Warfarin Sodium)  .... Take As Directed By Coumadin Clinic. 6)  Hydrocodone-Acetaminophen 5-325 Mg Tabs (Hydrocodone-Acetaminophen) .[name].. 1 By Mouth Once Daily As Needed Pain 7)  Flomax 0.4 Mg Caps (Tamsulosin Hcl) .... Generic - 1 By Mouth Once Daily-Pt Not Going To Continue 8)  Zocor 20 Mg Tabs (Simvastatin) .[name].. 1 By Mouth Once Daily 9)  Diovan 80 Mg Tabs (Valsartan) .... Take One Tablet By Mouth Daily  Allergies (verified): 1)  Ace Inhibitors 2)  Amiodarone Hcl  Past History:  Past Medical History: Last updated: 06/12/2008 ATYPICAL ATRIAL FLUTTER ATRIAL FIBRILLATION (ICD-427.31) BACK PAIN (ICD-724.5) UNSPECIFIED HEARING LOSS (ICD-389.9) SINUSITIS- ACUTE-NOS (ICD-461.9) COLONIC POLYPS, HX OF (ICD-V12.72) ABUSE, ALCOHOL, IN REMISSION (ICD-305.03) ERECTILE DYSFUNCTION (ICD-302.72) BENIGN PROSTATIC HYPERTROPHY (ICD-600.00) ALLERGIC RHINITIS (ICD-477.9) MELANOMA, MALIGNANT, SKIN NOS (ICD-172.9) OSTEOARTHROSIS NOS, OTHER SPEC SITE (ICD-715.98) VENTRICULAR TACHYCARDIA (ICD-427.1) HYPERTENSION (ICD-401.9) HYPERLIPIDEMIA (ICD-272.4)  Past Surgical History: Last updated: 03/14/2008 Rotator cuff repair - left Total knee replacement - right Tonsillectomy s/p left knee arthrosocopy  Social History: Last updated: 04/26/2008 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  He is retired from   Airline pilot.  He quit smoking 20 years ago.  He quit drinking 20 years ago.   No drugs.   Risk Factors: Smoking Status: quit (04/08/2007)  Review of Systems       all otherwise negative per pt -    Physical Exam  General:  alert and well-developed.  , mild ill appearing Head:  normocephalic and atraumatic.   Eyes:  vision grossly intact, pupils equal, and pupils round.   Ears:  R ear normal and L ear normal.  after wax irrigated bilat Nose:  no external deformity and no nasal discharge.   Mouth:  pharyngeal erythema and fair dentition.   Neck:  supple  and no masses.   Lungs:  normal respiratory effort and normal breath sounds.   Heart:  normal rate and regular rhythm.   Abdomen:  soft and normal bowel sounds. with diffuse mild tender without guarding or rebound   Extremities:  no edema, no erythema    Impression & Recommendations:  Problem # 1:  VIRAL GASTROENTERITIS (ICD-008.8) exam c/w this;  for tylenol and fluids, and immodium as needed;  for lomotil only for uncontrolled diarrhea  Problem # 2:  HYPERTENSION (ICD-401.9)  His updated medication list for this problem includes:    Cardizem La 360 Mg Xr24h-tab (Diltiazem hcl coated beads) .[name]... 1 by mouth once daily    Diovan 80 Mg Tabs (Valsartan) .[name]... Take one tablet by mouth daily ok to take half diovan for 1 wk until above improved; overcontrollled at this time  BP today: 110/68 Prior BP: 142/80 (11/16/2009)  Labs Reviewed: K+: 4.7 (01/08/2010) Creat: : 0.8 (01/08/2010)   Chol: 148 (11/16/2009)   HDL: 52.80 (11/16/2009)   LDL: 78 (11/16/2009)   TG: 84.0 (11/16/2009)  Problem # 3:  OTHER SPECIFIED FORMS OF HEARING LOSS (ICD-389.8) with bilat wax impactions recurrent - for irrigation today  Complete Medication List: 1)  Multivitamins Tabs (Multiple vitamin) .... Take 1 tablet by mouth once a day 2)  Calcium 600 Mg Tabs (Calcium) .... Take 1 tablet by mouth once a day 3)  Vitamin C 500 Mg Tabs (Ascorbic acid) .... Take 1 tablet by mouth once a day 4)  Cardizem La 360 Mg Xr24h-tab (Diltiazem hcl coated beads) .[name].. 1 by mouth once daily 5)  Warfarin Sodium 5 Mg Tabs (Warfarin sodium) .... Take as directed by coumadin clinic. 6)  Hydrocodone-acetaminophen 5-325 Mg Tabs (Hydrocodone-acetaminophen) .[name].. 1 by mouth once daily as needed pain 7)  Flomax 0.4 Mg Caps (Tamsulosin hcl) .... Generic - 1 by mouth once daily-pt not going to continue 8)  Zocor 20 Mg Tabs (Simvastatin) .[name].. 1 by mouth once daily 9)  Diovan 80 Mg Tabs (Valsartan) .... Take one tablet by mouth  daily  Patient Instructions: 1)  both of your ears were irrigated today 2)  Please take Half of your diovan for 1 wk 3)  Continue all previous medications as before this visit  4)  Please drink plenty of fluids, and tylenol is good for any pains such as you have been having 5)  Please schedule a follow-up appointment as needed.   Orders Added: 1)  Est. Patient Level IV [16109]

## 2010-03-07 NOTE — Progress Notes (Signed)
Summary: refill request  Phone Note Refill Request Message from:  Patient on September 05, 2009 12:05 PM  Refills Requested: Medication #1:  WARFARIN SODIUM 5 MG TABS Take as directed by coumadin clinic. faxl to [facility] [phone]   Method Requested: Fax to Local Pharmacy Initial call taken by: Glynda Jaeger,  September 05, 2009 12:05 PM    Prescriptions: WARFARIN SODIUM 5 MG TABS (WARFARIN SODIUM) Take as directed by coumadin clinic.  #100 x 1   Entered by:   Weston Brass PharmD   Authorized by:   Nathen May, MD, [facility]   Signed by:   Weston Brass PharmD on 09/05/2009   Method used:   Electronically to        [facility]* (retail)             ,          Ph: [phone]       Fax: [phone]   RxID:   [id number]

## 2010-03-07 NOTE — Progress Notes (Signed)
Summary: hydrocodone  Phone Note Call from Patient Call back at Home Phone [phone]   Caller: Patient/ Jonathan Orozco Call For: Corwin Levins MD Reason for Call: Refill Medication, Talk to Doctor Summary of Call: Wife call and states husband get hydrocodone med through [facility]. Had 1 refill left. but [facility] states rx has expired. Req new rx for hydrocodone to pick up so they can mail to [facility]. Pls advise Initial call taken by: Orlan Leavens RMA,  December 21, 2009 1:27 PM  Follow-up for Phone Call        done hardcopy to LIM side B - dahlia  Follow-up by: Corwin Levins MD,  December 21, 2009 1:37 PM  Additional Follow-up for Phone Call Additional follow up Details #1::        Pt is wanting a 90 supply for mailorder to send to [facility] Additional Follow-up by: Orlan Leavens RMA,  December 21, 2009 1:54 PM    Additional Follow-up for Phone Call Additional follow up Details #2::    Per md ok to change to # 90 with 1 addtional refill. Updated EMR. Notified wife rx ready for pick-up Follow-up by: Orlan Leavens RMA,  December 21, 2009 3:09 PM  New/Updated Medications: HYDROCODONE-ACETAMINOPHEN 5-325 MG TABS (HYDROCODONE-ACETAMINOPHEN) 1 by mouth once daily as needed pain Prescriptions: HYDROCODONE-ACETAMINOPHEN 5-325 MG TABS (HYDROCODONE-ACETAMINOPHEN) 1 by mouth once daily as needed pain  #90 x 1   Entered by:   Orlan Leavens RMA   Authorized by:   Corwin Levins MD   Signed by:   Orlan Leavens RMA on 12/21/2009   Method used:   Print then Give to Patient   RxID:   [id number] HYDROCODONE-ACETAMINOPHEN 5-325 MG TABS (HYDROCODONE-ACETAMINOPHEN) 1 by mouth once daily as needed pain  #30 x 2   Entered and Authorized by:   Corwin Levins MD   Signed by:   Corwin Levins MD on 12/21/2009   Method used:   Print then Give to Patient   RxID:   [id number]

## 2010-03-07 NOTE — Medication Information (Signed)
Summary: rov/sp  Anticoagulant Therapy  Managed by: Weston Brass, PharmD Referring MD: Sherryl Manges MD PCP: Dr.Ivan Supervising MD: Juanda Chance MD, Bruce Indication 1: Atrial Fibrillation (ICD-427.31) Lab Used: [facility] Site: [facility] INR POC 2.2 INR RANGE 2 - 3  Dietary changes: no    Health status changes: no    Bleeding/hemorrhagic complications: no    Recent/future hospitalizations: no    Any changes in medication regimen? no    Recent/future dental: no  Any missed doses?: no       Is patient compliant with meds? yes       Allergies (verified): 1)  Ace Inhibitors 2)  Amiodarone Hcl  Anticoagulation Management History:      The patient is taking warfarin and comes in today for a routine follow up visit.  Positive risk factors for bleeding include an age of 21 years or older.  The bleeding index is 'intermediate risk'.  Positive CHADS2 values include History of HTN and Age > 16 years old.  The start date was 03/06/2008.  His last INR was 2.5 ratio.  Anticoagulation responsible provider: Juanda Chance MD, Smitty Cords.  INR POC: 2.2.  Cuvette Lot#: [id number].  Exp: 12/2010.    Anticoagulation Management Assessment/Plan:      The patient's current anticoagulation dose is Warfarin sodium 5 mg tabs: Take as directed by coumadin clinic..  The target INR is 2 - 3.  The next INR is due 01/04/2010.  Anticoagulation instructions were given to patient.  Results were reviewed/authorized by Weston Brass, PharmD.  He was notified by Hoy Register, PharmD Candidate.         Prior Anticoagulation Instructions: INR 3.5  Skip today's dose of Coumadin then resume same dose of 1 tablet every day except 1 1/2 tablets on Monday and Friday.  Recheck INR in 3 weeks.    Current Anticoagulation Instructions: INR 2.2 Continue previous dose of 1 tablet daily except 1.5 tablets on Monday and Friday. Recheck INR in 4 weeks.

## 2010-03-07 NOTE — Medication Information (Signed)
Summary: rov/sp  Anticoagulant Therapy  Managed by: Bethena Midget, RN, BSN Referring MD: Sherryl Manges MD PCP: Dr.Rolondo Supervising MD: Clifton James MD, Cristal Deer Indication 1: Atrial Fibrillation (ICD-427.31) Lab Used: [facility] Site: [facility] INR POC 2.1 INR RANGE 2 - 3  Dietary changes: no    Health status changes: yes       Details: Saw PCP has virus  Bleeding/hemorrhagic complications: no    Recent/future hospitalizations: no    Any changes in medication regimen? no    Recent/future dental: no  Any missed doses?: no       Is patient compliant with meds? yes       Allergies: 1)  Ace Inhibitors 2)  Amiodarone Hcl  Anticoagulation Management History:      The patient is taking warfarin and comes in today for a routine follow up visit.  Positive risk factors for bleeding include an age of 75 years or older.  The bleeding index is 'intermediate risk'.  Positive CHADS2 values include History of HTN and Age > 75 years old.  The start date was 03/06/2008.  His last INR was 2.5 ratio.  Anticoagulation responsible Tamee Battin: Clifton James MD, Cristal Deer.  INR POC: 2.1.  Cuvette Lot#: [id number].  Exp: 11/2010.    Anticoagulation Management Assessment/Plan:      The patient's current anticoagulation dose is Warfarin sodium 5 mg tabs: Take as directed by coumadin clinic..  The target INR is 2 - 3.  The next INR is due 03/01/2010.  Anticoagulation instructions were given to patient.  Results were reviewed/authorized by Bethena Midget, RN, BSN.  He was notified by Bethena Midget, RN, BSN.         Prior Anticoagulation Instructions: INR 2.6  Continue same dose of 1 tablet every day except 1 1/2 tablets on Monday and Friday.  Recheck INR in 4 weeks.   Current Anticoagulation Instructions: INR 2.1 Continue 5mg s daily except 7.5mg s on Mondays and Fridays. Recheck in 4 weeks.

## 2010-03-07 NOTE — Letter (Signed)
Summary: [facility]  [facility]   Imported By: [place] 07/10/2009 10:34:46  _____________________________________________________________________  External Attachment:    Type:   Image     Comment:   External Document

## 2010-03-07 NOTE — Medication Information (Signed)
Summary: Jonathan Orozco  Anticoagulant Therapy  Managed by: Weston Brass, PharmD Referring MD: Sherryl Manges MD PCP: Dr.Terrick Supervising MD: Riley Kill MD, Maisie Fus Indication 1: Atrial Fibrillation (ICD-427.31) Lab Used: [facility] Site: [facility] INR POC 3.5 INR RANGE 2 - 3  Dietary changes: no    Health status changes: no    Bleeding/hemorrhagic complications: no    Recent/future hospitalizations: no    Any changes in medication regimen? no    Recent/future dental: no  Any missed doses?: yes     Details: ? a few weeks ago  Is patient compliant with meds? yes       Allergies: 1)  Ace Inhibitors 2)  Amiodarone Hcl  Anticoagulation Management History:      The patient is taking warfarin and comes in today for a routine follow up visit.  Positive risk factors for bleeding include an age of 75 years or older.  The bleeding index is 'intermediate risk'.  Positive CHADS2 values include History of HTN and Age > 42 years old.  The start date was 03/06/2008.  His last INR was 2.5 ratio.  Anticoagulation responsible provider: Riley Kill MD, Maisie Fus.  INR POC: 3.5.  Exp: 12/2010.    Anticoagulation Management Assessment/Plan:      The patient's current anticoagulation dose is Warfarin sodium 5 mg tabs: Take as directed by coumadin clinic..  The target INR is 2 - 3.  The next INR is due 12/07/2009.  Anticoagulation instructions were given to patient.  Results were reviewed/authorized by Weston Brass, PharmD.  He was notified by Weston Brass PharmD.         Prior Anticoagulation Instructions: INR 2.9  Continue taking 1 tablet everyday except 1 1/2 tablets on Mondays and Fridays. Re-check INR in 4 weeks.   Current Anticoagulation Instructions: INR 3.5  Skip today's dose of Coumadin then resume same dose of 1 tablet every day except 1 1/2 tablets on Monday and Friday.  Recheck INR in 3 weeks.

## 2010-03-07 NOTE — Medication Information (Signed)
Summary: rov/tm  Anticoagulant Therapy  Managed by: Bethena Midget, RN, BSN Referring MD: Sherryl Manges MD PCP: Dr.Diogenes Supervising MD: Tenny Craw MD, Gunnar Fusi Indication 1: Atrial Fibrillation (ICD-427.31) Lab Used: [facility] Site: [facility] INR POC 2.4 INR RANGE 2 - 3  Dietary changes: no    Health status changes: no    Bleeding/hemorrhagic complications: no    Recent/future hospitalizations: no    Any changes in medication regimen? no    Recent/future dental: no  Any missed doses?: no       Is patient compliant with meds? yes       Allergies: 1)  Ace Inhibitors 2)  Amiodarone Hcl  Anticoagulation Management History:      The patient is taking warfarin and comes in today for a routine follow up visit.  Positive risk factors for bleeding include an age of 75 years or older.  The bleeding index is 'intermediate risk'.  Positive CHADS2 values include History of HTN and Age > 32 years old.  The start date was 03/06/2008.  His last INR was 2.5 ratio.  Anticoagulation responsible provider: Tenny Craw MD, Gunnar Fusi.  INR POC: 2.4.  Cuvette Lot#: [id number].  Exp: 02/2011.    Anticoagulation Management Assessment/Plan:      The patient's current anticoagulation dose is Warfarin sodium 5 mg tabs: Take as directed by coumadin clinic..  The target INR is 2 - 3.  The next INR is due 03/29/2010.  Anticoagulation instructions were given to patient.  Results were reviewed/authorized by Bethena Midget, RN, BSN.  He was notified by Bethena Midget, RN, BSN.         Prior Anticoagulation Instructions: INR 2.1 Continue 5mg s daily except 7.5mg s on Mondays and Fridays. Recheck in 4 weeks.   Current Anticoagulation Instructions: INR 2.4 Continue 5mg s daily except 7.5mg s on Mondays and Fridays. Recheck in 4 weeks.

## 2010-03-07 NOTE — Medication Information (Signed)
Summary: rov/tm  Anticoagulant Therapy  Managed by: Eda Keys, PharmD Referring MD: Sherryl Manges MD PCP: Dr.Vedansh Supervising MD: Ladona Ridgel MD, Sharlot Gowda Indication 1: Atrial Fibrillation (ICD-427.31) Lab Used: [facility] Site: [facility] INR POC 2.1 INR RANGE 2 - 3  Dietary changes: no    Health status changes: no    Bleeding/hemorrhagic complications: no    Recent/future hospitalizations: no    Any changes in medication regimen? no    Recent/future dental: no  Any missed doses?: no       Is patient compliant with meds? yes       Allergies: 1)  Ace Inhibitors 2)  Amiodarone Hcl  Anticoagulation Management History:      The patient is taking warfarin and comes in today for a routine follow up visit.  Positive risk factors for bleeding include an age of 2 years or older.  The bleeding index is 'intermediate risk'.  Positive CHADS2 values include History of HTN and Age > 27 years old.  The start date was 03/06/2008.  His last INR was 2.5 ratio.  Anticoagulation responsible provider: Ladona Ridgel MD, Sharlot Gowda.  INR POC: 2.1.  Cuvette Lot#: [id number].  Exp: 09/2010.    Anticoagulation Management Assessment/Plan:      The patient's current anticoagulation dose is Warfarin sodium 5 mg tabs: Take as directed by coumadin clinic..  The target INR is 2 - 3.  The next INR is due 07/31/2009.  Anticoagulation instructions were given to patient.  Results were reviewed/authorized by Eda Keys, PharmD.  He was notified by Eda Keys.         Prior Anticoagulation Instructions: INR 2.2 Continue 5mg s daily except 7.5mg s on Mondays. Recheck in 4 weeks.   Current Anticoagulation Instructions: INR 2.1  Take an extra 1/2 table today.  Then continue taking 1.5 tablets on Monday and take 1 tablet all other days.  Return to clinic in 4 weeks.

## 2010-03-07 NOTE — Medication Information (Signed)
Summary: rov/ewj  Anticoagulant Therapy  Managed by: Cloyde Reams, RN, BSN Referring MD: Sherryl Manges MD PCP: Dr.Elyan Supervising MD: Daleen Squibb MD, Maisie Fus Indication 1: Atrial Fibrillation (ICD-427.31) Lab Used: [facility] Site: [facility] INR POC 2.7 INR RANGE 2 - 3  Dietary changes: no    Health status changes: no    Bleeding/hemorrhagic complications: no    Recent/future hospitalizations: no    Any changes in medication regimen? no    Recent/future dental: no  Any missed doses?: no       Is patient compliant with meds? yes       Allergies: 1)  Ace Inhibitors 2)  Amiodarone Hcl  Anticoagulation Management History:      The patient is taking warfarin and comes in today for a routine follow up visit.  Positive risk factors for bleeding include an age of 75 years or older.  The bleeding index is 'intermediate risk'.  Positive CHADS2 values include History of HTN and Age > 75 years old.  The start date was 03/06/2008.  His last INR was 2.5 ratio.  Anticoagulation responsible provider: Daleen Squibb MD, Maisie Fus.  INR POC: 2.7.  Cuvette Lot#: [id number].  Exp: 05/2010.    Anticoagulation Management Assessment/Plan:      The patient's current anticoagulation dose is Warfarin sodium 5 mg tabs: Take as directed by coumadin clinic..  The target INR is 2 - 3.  The next INR is due 04/12/2009.  Anticoagulation instructions were given to patient.  Results were reviewed/authorized by Cloyde Reams, RN, BSN.  He was notified by Cloyde Reams RN.         Prior Anticoagulation Instructions: INR 2.4  Continue on same dosage 5mg  daily except 7.5mg  on Mondays.  Recheck in 4 weeks.    Current Anticoagulation Instructions: INR 2.7  Continue on same dosage 5mg  daily except 7.5mg  on Mondays.  Recheck in 4 weeks.

## 2010-03-07 NOTE — Medication Information (Signed)
Summary: rov/ewj  Anticoagulant Therapy  Managed by: Cloyde Reams, RN, BSN Referring MD: Sherryl Manges MD PCP: Dr.Azrael Supervising MD: Shirlee Latch MD, Dalton Indication 1: Atrial Fibrillation (ICD-427.31) Lab Used: [facility] Site: [facility] INR POC 2.5 INR RANGE 2 - 3  Dietary changes: no    Health status changes: no    Bleeding/hemorrhagic complications: no    Recent/future hospitalizations: no    Any changes in medication regimen? yes       Details: Discontinued Diovan.   Recent/future dental: no  Any missed doses?: no       Is patient compliant with meds? yes       Allergies: 1)  Ace Inhibitors 2)  Amiodarone Hcl  Anticoagulation Management History:      The patient is taking warfarin and comes in today for a routine follow up visit.  Positive risk factors for bleeding include an age of 75 years or older.  The bleeding index is 'intermediate risk'.  Positive CHADS2 values include History of HTN and Age > 75 years old.  The start date was 03/06/2008.  His last INR was 2.5 ratio.  Anticoagulation responsible Kripa Foskey: Shirlee Latch MD, Dalton.  INR POC: 2.5.  Cuvette Lot#: [id number].  Exp: 11/2010.    Anticoagulation Management Assessment/Plan:      The patient's current anticoagulation dose is Warfarin sodium 5 mg tabs: Take as directed by coumadin clinic..  The target INR is 2 - 3.  The next INR is due 10/19/2009.  Anticoagulation instructions were given to patient.  Results were reviewed/authorized by Cloyde Reams, RN, BSN.  He was notified by Cloyde Reams RN.         Prior Anticoagulation Instructions: INR 2.0  Start taking 1 tablet daily except 1.5 tablets on Mondays and Fridays.  Recheck in 4 weeks.    Current Anticoagulation Instructions: INR 2.5  Continue on same dosage 1 tablet daily except 1.5 tablets on Mondays and Fridays.  Recheck in 4 weeks.

## 2010-03-07 NOTE — Medication Information (Signed)
Summary: rov/tm  Anticoagulant Therapy  Managed by: Cloyde Reams, RN, BSN Referring MD: Sherryl Manges MD PCP: Dr.Vanden Supervising MD: Gala Romney MD, Reuel Boom Indication 1: Atrial Fibrillation (ICD-427.31) Lab Used: [facility] Site: [facility] INR POC 2.4 INR RANGE 2 - 3  Dietary changes: no    Health status changes: no    Bleeding/hemorrhagic complications: no    Recent/future hospitalizations: no    Any changes in medication regimen? no    Recent/future dental: no  Any missed doses?: no       Is patient compliant with meds? yes       Allergies: 1)  Ace Inhibitors 2)  Amiodarone Hcl  Anticoagulation Management History:      The patient is taking warfarin and comes in today for a routine follow up visit.  Positive risk factors for bleeding include an age of 4 years or older.  The bleeding index is 'intermediate risk'.  Positive CHADS2 values include History of HTN and Age > 64 years old.  The start date was 03/06/2008.  His last INR was 2.5 ratio.  Anticoagulation responsible provider: Bensimhon MD, Reuel Boom.  INR POC: 2.4.  Cuvette Lot#: [id number].  Exp: 05/2010.    Anticoagulation Management Assessment/Plan:      The patient's current anticoagulation dose is Warfarin sodium 5 mg tabs: Take as directed by coumadin clinic..  The target INR is 2 - 3.  The next INR is due 03/16/2009.  Anticoagulation instructions were given to patient.  Results were reviewed/authorized by Cloyde Reams, RN, BSN.  He was notified by Cloyde Reams RN.         Prior Anticoagulation Instructions: INR 2.2 Continue 5mg s daily except 7.5mg s on Mondays. Recheck in 4 weeks.    Current Anticoagulation Instructions: INR 2.4  Continue on same dosage 5mg  daily except 7.5mg  on Mondays.  Recheck in 4 weeks.

## 2010-03-07 NOTE — Medication Information (Signed)
Summary: rov/nb  Anticoagulant Therapy  Managed by: Weston Brass, PharmD Referring MD: Sherryl Manges MD PCP: Dr.Davied Supervising MD: Juanda Chance MD, Bruce Indication 1: Atrial Fibrillation (ICD-427.31) Lab Used: [facility] Site: [facility] INR POC 2.6 INR RANGE 2 - 3  Dietary changes: no    Health status changes: no    Bleeding/hemorrhagic complications: no    Recent/future hospitalizations: no    Any changes in medication regimen? yes       Details: restarted diovan   Recent/future dental: no  Any missed doses?: no       Is patient compliant with meds? yes       Current Medications (verified): 1)  Multivitamins   Tabs (Multiple Vitamin) .... Take 1 Tablet By Mouth Once A Day 2)  Calcium 600 Mg  Tabs (Calcium) .... Take 1 Tablet By Mouth Once A Day 3)  Vitamin C 500 Mg  Tabs (Ascorbic Acid) .... Take 1 Tablet By Mouth Once A Day 4)  Cardizem La 360 Mg Xr24h-Tab (Diltiazem Hcl Coated Beads) .[name].. 1 By Mouth Once Daily 5)  Warfarin Sodium 5 Mg Tabs (Warfarin Sodium) .... Take As Directed By Coumadin Clinic. 6)  Hydrocodone-Acetaminophen 5-325 Mg Tabs (Hydrocodone-Acetaminophen) .[name].. 1 By Mouth Once Daily As Needed Pain 7)  Flomax 0.4 Mg Caps (Tamsulosin Hcl) .... Generic - 1 By Mouth Once Daily-Pt Not Going To Continue 8)  Zocor 20 Mg Tabs (Simvastatin) .[name].. 1 By Mouth Once Daily 9)  Diovan 80 Mg Tabs (Valsartan) .... Take One Tablet By Mouth Daily  Allergies: 1)  Ace Inhibitors 2)  Amiodarone Hcl  Anticoagulation Management History:      The patient is taking warfarin and comes in today for a routine follow up visit.  Positive risk factors for bleeding include an age of 32 years or older.  The bleeding index is 'intermediate risk'.  Positive CHADS2 values include History of HTN and Age > 34 years old.  The start date was 03/06/2008.  His last INR was 2.5 ratio.  Anticoagulation responsible provider: Juanda Chance MD, Smitty Cords.  INR POC: 2.6.  Cuvette Lot#: [id number].  Exp: 01/2011.     Anticoagulation Management Assessment/Plan:      The patient's current anticoagulation dose is Warfarin sodium 5 mg tabs: Take as directed by coumadin clinic..  The target INR is 2 - 3.  The next INR is due 02/01/2010.  Anticoagulation instructions were given to patient.  Results were reviewed/authorized by Weston Brass, PharmD.  He was notified by Weston Brass PharmD.         Prior Anticoagulation Instructions: INR 2.2 Continue previous dose of 1 tablet daily except 1.5 tablets on Monday and Friday. Recheck INR in 4 weeks.   Current Anticoagulation Instructions: INR 2.6  Continue same dose of 1 tablet every day except 1 1/2 tablets on Monday and Friday.  Recheck INR in 4 weeks.   Appended Document: rov/nb robin to call pt;  we were notified he is taking the diovan now, and most recent K was 5.2  please ask pt at his convenicenc (?Mon or tues)  for bmet:  401.1  Appended Document: rov/nb called pt. informed of above information. Patient agreed and will come to the lab monday 01/07/2010.

## 2010-03-07 NOTE — Assessment & Plan Note (Signed)
Summary: f/u appt/flu shot/cd   Vital Signs:  Patient profile:   77 year old male Height:      71 inches Weight:      185.50 pounds BMI:     25.97 O2 Sat:      97 % on Room air Temp:     97.7 degrees F oral Pulse rate:   67 / minute BP sitting:   142 / 80  (left arm) Cuff size:   regular  Vitals Entered By: Zella Ball Ewing CMA Duncan Dull) (November 16, 2009 11:06 AM)  O2 Flow:  Room air  CC: followup, flu shot/RE   Primary Care Provider:  Dr.Ardie  CC:  followup and flu shot/RE.  History of Present Illness: her to f/u;  concerned about "things in general";  c/o fatigue "doing the least little things"  such as yardwork where he did not used to be tired one yr ago;  mentions his colon and has cont'd intermittent hard and loose stools for years;  has been eating more fruit he grown on his property but overall little changed;  watches the TV news and is very worried about the direction of country politcally adn economically;  did decide to stop the flomax as it did not seem to help wtih 2-3 times per night nocturia;  now reduced his fluid intake after 9pm so much less nocturia;  [facility] - [facility] he walks the walking track  - does 2 miles at 15 min miles - occasionally faster to 12 min -mile; then does some midl wt lifting though had to stop some shoulder excericse due to arthritis flare - had cortisone shot per ortho recently;  also recently had LBP flare - saw Dr Fredonia Highland  - MRI ok, and pain resolved by 1 wk later; Pt denies CP, worsening sob, doe, wheezing, orthopnea, pnd, worsening LE edema, palps, dizziness or syncope  No fever, wt loss, night sweats, loss of appetite or other constitutional symptoms .  Denies ploydipsia , polyuria. Tries to follow low chol diet though does go to [employer] on tuesdays.  Due for flu shot today.  Denies worsening depressive symptoms, suicidal ideation  or panic  though sister now on hospice with lung cancer not expected to do  well .  Pt does request LFT's and lipids since last done 6 mo ago.  Now taking half statin  - the 290 mg zocor due to being also on cardizem.  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) 8)  Hypertension  (ICD-401.9) 9)  Back Pain  (ICD-724.5) 10)  Chest Pain  (ICD-786.50) 11)  Ear Pain, Left  (ICD-388.70) 12)  Preventive Health Care  (ICD-V70.0) 13)  Unspecified Hearing Loss  (ICD-389.9) 14)  Sinusitis- Acute-nos  (ICD-461.9) 15)  Colonic Polyps, Hx of  (ICD-V12.72) 16)  Abuse, Alcohol, 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 1po Once Daily 2)  Multivitamins   Tabs (Multiple Vitamin) .... Take 1 Tablet By Mouth Once A Day 3)  Calcium 600 Mg  Tabs (Calcium) .... Take 1 Tablet By Mouth Once A Day 4)  Vitamin C 500 Mg  Tabs (Ascorbic Acid) .... Take 1 Tablet By Mouth Once A Day 5)  Cardizem La 360 Mg Xr24h-Tab (Diltiazem Hcl Coated Beads) .[name].. 1 By Mouth Once Daily 6)  Warfarin Sodium 5 Mg Tabs (Warfarin Sodium) .... Take As Directed By Coumadin Clinic. 7)  Hydrocodone-Acetaminophen 5-325 Mg Tabs (Hydrocodone-Acetaminophen) .[name].. 1 By Mouth Once Daily As Needed Pain 8)  Flomax 0.4 Mg Caps (Tamsulosin Hcl) .... Generic - 1  By Mouth Once Daily-Pt Not Going To Continue 9)  Zocor 20 Mg Tabs (Simvastatin) .[name].. 1 By Mouth Once Daily  Current Medications (verified): 1)  Multivitamins   Tabs (Multiple Vitamin) .... Take 1 Tablet By Mouth Once A Day 2)  Calcium 600 Mg  Tabs (Calcium) .... Take 1 Tablet By Mouth Once A Day 3)  Vitamin C 500 Mg  Tabs (Ascorbic Acid) .... Take 1 Tablet By Mouth Once A Day 4)  Cardizem La 360 Mg Xr24h-Tab (Diltiazem Hcl Coated Beads) .[name].. 1 By Mouth Once Daily 5)  Warfarin Sodium 5 Mg Tabs (Warfarin Sodium) .... Take As Directed By Coumadin Clinic. 6)  Hydrocodone-Acetaminophen 5-325 Mg Tabs (Hydrocodone-Acetaminophen) .[name].. 1 By Mouth Once Daily As Needed Pain 7)  Flomax 0.4 Mg Caps (Tamsulosin Hcl) .... Generic - 1 By Mouth Once Daily-Pt Not Going To Continue 8)  Zocor 20 Mg Tabs (Simvastatin) .[name].. 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  He is retired from   Airline pilot.  He quit smoking 20 years ago.  He quit drinking 20 years ago.   No drugs.   Risk Factors: Smoking Status:  quit (04/08/2007)  Review of Systems       all otherwise negative per pt -    Physical Exam  General:  alert and overweight-appearing.   Head:  normocephalic and atraumatic.   Eyes:  vision grossly intact, pupils equal, and pupils round.   Ears:  R ear normal and L ear normal.   Nose:  no external deformity and no nasal discharge.   Mouth:  no gingival abnormalities and pharynx pink and moist.   Neck:  supple and no masses.   Lungs:  normal respiratory effort and normal breath sounds.   Heart:  normal rate and regular rhythm.   Abdomen:  soft, non-tender, and normal bowel sounds.   Msk:  no joint tenderness and no joint swelling.   Extremities:  no edema, no erythema  Neurologic:  cranial nerves II-XII intact and strength normal in all extremities.   Skin:  color normal and no rashes.   Psych:  not depressed appearing and moderately anxious.     Impression & Recommendations:  Problem # 1:  HYPERTENSION (ICD-401.9)  His updated medication list for this problem includes:    Cardizem La 360 Mg Xr24h-tab (Diltiazem hcl coated beads) .[name]... 1 by mouth once daily  BP today: 142/80 Prior BP: 132/70 (08/10/2009)  Labs Reviewed: K+: 5.2 (03/16/2009) Creat: : 0.9 (03/16/2009)   Chol: 126 (03/16/2009)   HDL: 56.00 (03/16/2009)   LDL: 59 (03/16/2009)   TG: 53.0 (03/16/2009) stable overall by hx and exam, ok to continue meds/tx as is   Problem # 2:  HYPERLIPIDEMIA (ICD-272.4)  The following medications were removed from the medication list:    Simvastatin 40 Mg Tabs (Simvastatin) .[name]... 1po once daily His updated medication list for this problem includes:    Zocor 20 Mg Tabs (Simvastatin) .[name]... 1 by mouth once daily  Orders: TLB-Lipid Panel (80061-LIPID)  Labs Reviewed: SGOT: 27 (03/16/2009)   SGPT: 24 (03/16/2009)   HDL:56.00 (03/16/2009), 50.2 (03/14/2008)  LDL:59 (03/16/2009), 66 (03/14/2008)  Chol:126 (03/16/2009), 125 (03/14/2008)  Trig:53.0 (03/16/2009), 44  (03/14/2008) stable overall by hx and exam, ok to continue meds/tx as is  , Pt to continue diet efforts, good med tolerance; to check labs - goal LDL less than 100  Problem # 3:  FATIGUE (ICD-780.79) exam benign, to check labs below; follow with expectant management  Orders: TLB-BMP (Basic Metabolic Panel-BMET) (80048-METABOL) TLB-CBC Platelet - w/Differential (85025-CBCD) TLB-Sedimentation Rate (ESR) (85652-ESR) TLB-TSH (Thyroid Stimulating Hormone) (84443-TSH)  Complete Medication List: 1)  Multivitamins Tabs (Multiple vitamin) .... Take 1 tablet by mouth once a day 2)  Calcium 600 Mg Tabs (Calcium) .... Take 1 tablet by mouth once a day 3)  Vitamin C 500 Mg Tabs (Ascorbic acid) .... Take 1 tablet by mouth once a day 4)  Cardizem La 360 Mg Xr24h-tab (Diltiazem hcl coated beads) .[name].. 1 by mouth once daily 5)  Warfarin Sodium 5 Mg Tabs (Warfarin sodium) .... Take as directed by coumadin clinic. 6)  Hydrocodone-acetaminophen 5-325 Mg Tabs (Hydrocodone-acetaminophen) .[name].. 1 by mouth once daily as needed pain 7)  Flomax 0.4 Mg Caps (Tamsulosin hcl) .... Generic - 1 by mouth once daily-pt not going to continue 8)  Zocor 20 Mg Tabs (Simvastatin) .[name].. 1 by mouth once daily  Other Orders: Flu Vaccine 31yrs + MEDICARE PATIENTS (E4540) Administration Flu vaccine - MCR (G0008) TLB-Hepatic/Liver Function Pnl (80076-HEPATIC)  Patient Instructions: 1)  Continue all previous medications as before this visit  2)  Please bring your BP monitor to your next visit to check 3)  Please go to the Lab in the basement for your blood and/or urine tests today 4)  Please call the number on the [facility] Card for results of your testing  5)  You had the flu shot today 6)  Please schedule a follow-up appointment in 4 months with CPX labs   Flu Vaccine Consent Questions     Do you have a history of severe allergic reactions to this vaccine? no    Any prior history of allergic reactions to egg and/or gelatin? no     Do you have a sensitivity to the preservative Thimersol? no    Do you have a past history of Guillan-Barre Syndrome? no    Do you currently have an acute febrile illness? no    Have you ever had a severe  reaction to latex? no    Vaccine information given and explained to patient? yes    Are you currently pregnant? no    Lot Number:[id number]   Exp Date:08/03/2010   Site Given  Left Deltoid [id number]

## 2010-03-07 NOTE — Medication Information (Signed)
Summary: rov/eac  Anticoagulant Therapy  Managed by: Bethena Midget, RN, BSN Referring MD: Sherryl Manges MD PCP: Dr.Damarian Supervising MD: Gala Romney MD, Reuel Boom Indication 1: Atrial Fibrillation (ICD-427.31) Lab Used: [facility] Site: [facility] INR POC 1.9 INR RANGE 2 - 3  Dietary changes: no    Health status changes: no    Bleeding/hemorrhagic complications: no    Recent/future hospitalizations: no    Any changes in medication regimen? no    Recent/future dental: no  Any missed doses?: no       Is patient compliant with meds? yes       Allergies: 1)  Ace Inhibitors 2)  Amiodarone Hcl  Anticoagulation Management History:      The patient is taking warfarin and comes in today for a routine follow up visit.  Positive risk factors for bleeding include an age of 75 years or older.  The bleeding index is 'intermediate risk'.  Positive CHADS2 values include History of HTN and Age > 59 years old.  The start date was 03/06/2008.  His last INR was 2.5 ratio.  Anticoagulation responsible provider: Bensimhon MD, Reuel Boom.  INR POC: 1.9.  Cuvette Lot#: [id number].  Exp: 10/2010.    Anticoagulation Management Assessment/Plan:      The patient's current anticoagulation dose is Warfarin sodium 5 mg tabs: Take as directed by coumadin clinic..  The target INR is 2 - 3.  The next INR is due 08/28/2009.  Anticoagulation instructions were given to patient.  Results were reviewed/authorized by Bethena Midget, RN, BSN.  He was notified by Bethena Midget, RN, BSN.         Prior Anticoagulation Instructions: INR 2.1  Take an extra 1/2 table today.  Then continue taking 1.5 tablets on Monday and take 1 tablet all other days.  Return to clinic in 4 weeks.    Current Anticoagulation Instructions: INR 1.9 Today take 1 1/2 pills then resume 1 pill everyday except 1 1/2 pills on Mondays. Recheck in  4 weeks.

## 2010-03-07 NOTE — Medication Information (Signed)
Summary: rov/ewj  Anticoagulant Therapy  Managed by: Bethena Midget, RN, BSN Referring MD: Sherryl Manges MD PCP: Dr.Rip Supervising MD: Shirlee Latch MD, Soila Printup Indication 1: Atrial Fibrillation (ICD-427.31) Lab Used: [facility] Site: [facility] INR POC 2.2 INR RANGE 2 - 3  Dietary changes: no    Health status changes: no    Bleeding/hemorrhagic complications: no    Recent/future hospitalizations: no    Any changes in medication regimen? no    Recent/future dental: no  Any missed doses?: no       Is patient compliant with meds? yes       Allergies: 1)  Ace Inhibitors 2)  Amiodarone Hcl  Anticoagulation Management History:      The patient is taking warfarin and comes in today for a routine follow up visit.  Positive risk factors for bleeding include an age of 75 years or older.  The bleeding index is 'intermediate risk'.  Positive CHADS2 values include History of HTN and Age > 1 years old.  The start date was 03/06/2008.  His last INR was 2.5 ratio.  Anticoagulation responsible provider: Shirlee Latch MD, Tamiko Leopard.  INR POC: 2.2.  Cuvette Lot#: [id number].  Exp: 07/2010.    Anticoagulation Management Assessment/Plan:      The patient's current anticoagulation dose is Warfarin sodium 5 mg tabs: Take as directed by coumadin clinic..  The target INR is 2 - 3.  The next INR is due 07/05/2009.  Anticoagulation instructions were given to patient.  Results were reviewed/authorized by Bethena Midget, RN, BSN.  He was notified by Bethena Midget, RN, BSN.         Prior Anticoagulation Instructions: INR 2.7  Continue on same dosage 5mg  daily except 7.5mg  on Mondays.  Recheck in 4 weeks.    Current Anticoagulation Instructions: INR 2.2 Continue 5mg s daily except 7.5mg s on Mondays. Recheck in 4 weeks.

## 2010-03-07 NOTE — Medication Information (Signed)
Summary: rov/tm  Anticoagulant Therapy  Managed by: Cloyde Reams, RN, BSN Referring MD: Sherryl Manges MD PCP: Dr.Savier Supervising MD: Eden Emms MD, Theron Arista Indication 1: Atrial Fibrillation (ICD-427.31) Lab Used: [facility] Site: [facility] INR POC 2.7 INR RANGE 2 - 3  Dietary changes: no    Health status changes: no    Bleeding/hemorrhagic complications: yes       Details: Occ blood in nose.  Recent/future hospitalizations: no    Any changes in medication regimen? no    Recent/future dental: no  Any missed doses?: no       Is patient compliant with meds? yes       Allergies (verified): 1)  Ace Inhibitors 2)  Amiodarone Hcl  Anticoagulation Management History:      The patient is taking warfarin and comes in today for a routine follow up visit.  Positive risk factors for bleeding include an age of 75 years or older.  The bleeding index is 'intermediate risk'.  Positive CHADS2 values include History of HTN and Age > 58 years old.  The start date was 03/06/2008.  His last INR was 2.5 ratio.  Anticoagulation responsible provider: Eden Emms MD, Theron Arista.  INR POC: 2.7.  Cuvette Lot#: [id number].  Exp: 06/2010.    Anticoagulation Management Assessment/Plan:      The patient's current anticoagulation dose is Warfarin sodium 5 mg tabs: Take as directed by coumadin clinic..  The target INR is 2 - 3.  The next INR is due 06/07/2009.  Anticoagulation instructions were given to patient.  Results were reviewed/authorized by Cloyde Reams, RN, BSN.  He was notified by Cloyde Reams RN.         Prior Anticoagulation Instructions: INR 2.3 Continue 5mg s daily except 7.5mg s on Mondays. Recheck in 4 weeks.   Current Anticoagulation Instructions: INR 2.7  Continue on same dosage 5mg  daily except 7.5mg  on Mondays.  Recheck in 4 weeks.

## 2010-03-07 NOTE — Assessment & Plan Note (Signed)
Summary: WAX IN EARS/NWS   Vital Signs:  Patient profile:   77 year old male Height:      72 inches Weight:      184 pounds BMI:     25.05 O2 Sat:      96 % on Room air Temp:     97.3 degrees F oral Pulse rate:   77 / minute BP sitting:   132 / 70  (left arm) Cuff size:   regular  Vitals Entered By: Bill Salinas CMA (August 10, 2009 3:17 PM)  O2 Flow:  Room air  Primary Care Provider:  Dr.Camran   History of Present Illness: here to c/o bilat hearing loss for the last 3 days, seems clogged with wax worse after showers until the water gets out;  no fever, headache, ST, cough, earache, neck pain and Pt denies CP, sob, doe, wheezing, orthopnea, pnd, worsening LE edema, palps, dizziness or syncope    Has not been taking the diovan for the past month due to dizziness recently when taking it,  Has been trying to follow lower chol diet, overall o/w good med compliacne and tolerance.  No other new complaints.    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 1po Once Daily 2)  Simvastatin 40  Mg  Tabs (Simvastatin) .[name].. 1po Once Daily 3)  Multivitamins   Tabs (Multiple Vitamin) .... Take 1 Tablet By Mouth Once A Day 4)  Calcium 600 Mg  Tabs (Calcium) .... Take 1 Tablet By Mouth Once A Day 5)  Vitamin C 500 Mg  Tabs (Ascorbic Acid) .... Take 1 Tablet By Mouth Once A Day 6)  Cardizem La 360 Mg Xr24h-Tab (Diltiazem Hcl Coated Beads) .[name].. 1 By Mouth Once Daily 7)  Warfarin Sodium 5 Mg Tabs (Warfarin Sodium) .... Take As Directed By Coumadin Clinic. 8)  Hydrocodone-Acetaminophen 5-325 Mg Tabs (Hydrocodone-Acetaminophen) .[name].. 1 By Mouth Once Daily As Needed Pain 9)  Flomax 0.4 Mg Caps (Tamsulosin Hcl) .... Generic - 1 By Mouth Once Daily-Pt Not Going To Continue  Current Medications (verified): 1)  Simvastatin 40 Mg  Tabs (Simvastatin) .[name].. 1po Once Daily 2)  Multivitamins   Tabs (Multiple Vitamin) .... Take 1 Tablet By Mouth Once A Day 3)  Calcium 600 Mg  Tabs (Calcium) .... Take 1 Tablet By Mouth Once A Day 4)  Vitamin C 500 Mg  Tabs (Ascorbic Acid) .... Take 1 Tablet By Mouth Once A Day 5)  Cardizem La 360 Mg Xr24h-Tab (Diltiazem Hcl Coated Beads) .[name].. 1 By Mouth Once Daily 6)  Warfarin Sodium 5 Mg Tabs (  Warfarin Sodium) .... Take As Directed By Coumadin Clinic. 7)  Hydrocodone-Acetaminophen 5-325 Mg Tabs (Hydrocodone-Acetaminophen) .[name].. 1 By Mouth Once Daily As Needed Pain 8)  Flomax 0.4 Mg Caps (Tamsulosin Hcl) .... 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  He is retired from   Airline pilot.  He quit smoking 20 years ago.  He quit drinking 20 years ago.   No drugs.   Risk Factors: Smoking Status: quit (04/08/2007)  Review of Systems       all otherwise negative per pt -    Physical Exam  General:  alert and overweight-appearing.   Head:  normocephalic and atraumatic.   Eyes:  vision grossly intact, pupils equal, and pupils round.   Ears:  R ear normal and L ear normal.  after irrigation wax, canals clear, no swelling or redness or d/c Nose:  nasal dischargemucosal pallor and mucosal edema.   Mouth:  pharyngeal erythema and fair dentition.   Neck:  supple and no masses.   Lungs:  normal respiratory effort and normal breath sounds.   Heart:  normal rate and regular rhythm.   Msk:  no joint tenderness and no joint swelling.   Extremities:  no edema, no erythema    Impression & Recommendations:  Problem # 1:  HYPERTENSION (ICD-401.9)  The following medications were removed from the medication list:    Diovan 80 Mg Tabs (Valsartan) .[name]... 1po once daily His updated medication list for this problem includes:    Cardizem La 360 Mg Xr24h-tab (Diltiazem hcl coated beads) .[name]... 1 by mouth once daily  BP today: 132/70 Prior BP: 150/76 (04/30/2009)  Labs Reviewed: K+: 5.2 (03/16/2009) Creat: : 0.9 (03/16/2009)   Chol: 126 (03/16/2009)   HDL: 56.00 (03/16/2009)   LDL: 59 (03/16/2009)   TG: 53.0 (03/16/2009) stable overall by hx and exam, ok to continue meds/tx as is   Problem # 2:  ATRIAL FLUTTERS  -ATYPICAL (ICD-427.31)  His updated medication list for this problem includes:    Cardizem La 360 Mg Xr24h-tab (Diltiazem hcl coated beads) .[name]... 1 by mouth once daily    Warfarin Sodium 5 Mg Tabs (Warfarin sodium) .[name]... Take as directed by coumadin clinic. as above - stable overall by hx and exam, ok to continue meds/tx as is   Problem # 3:  HYPERLIPIDEMIA (ICD-272.4)  His updated medication list for this problem includes:    Simvastatin 40 Mg Tabs (Simvastatin) .[name]... 1po once daily  Labs Reviewed: SGOT: 27 (03/16/2009)   SGPT: 24 (03/16/2009)   HDL:56.00 (03/16/2009), 50.2 (03/14/2008)  LDL:59 (03/16/2009), 66 (03/14/2008)  Chol:126 (03/16/2009), 125 (03/14/2008)  Trig:53.0 (03/16/2009), 44 (03/14/2008) stable overall by hx and exam, ok to continue meds/tx as is , Pt to continue diet efforts, good med tolerance; - goal LDL less than 70  Problem # 4:  OTHER SPECIFIED FORMS OF HEARING LOSS (ICD-389.8) resolved with irrigation  Complete Medication List: 1)  Simvastatin 40 Mg Tabs (Simvastatin) .[name].. 1po once daily 2)  Multivitamins Tabs (Multiple vitamin) .... Take 1 tablet by mouth once a day 3)  Calcium 600 Mg Tabs (Calcium) .... Take 1 tablet by mouth once a day 4)  Vitamin C 500 Mg Tabs (Ascorbic acid) .... Take 1 tablet by mouth once a day 5)  Cardizem La 360 Mg Xr24h-tab (Diltiazem hcl coated beads) .[name].. 1 by mouth once daily 6)  Warfarin Sodium 5 Mg Tabs (Warfarin sodium) .... Take as directed by coumadin clinic. 7)  Hydrocodone-acetaminophen 5-325 Mg Tabs (Hydrocodone-acetaminophen) .[name].. 1 by mouth once daily as needed pain 8)  Flomax 0.4 Mg Caps (Tamsulosin hcl) .... Generic - 1 by mouth once daily-pt not going to continue  Patient Instructions: 1)  your ears were irrigated today 2)  ok to stay off the diovan 3)  Continue all previous medications as before this visit  4)  Please schedule a follow-up appointment in Feb 2012 wth CPX labs , or sooner if needed

## 2010-03-07 NOTE — Assessment & Plan Note (Signed)
Summary: CPX,MEDICARE/#/CD   Vital Signs:  Patient profile:   77 year old male Height:      72 inches Weight:      188.38 pounds BMI:     25.64 O2 Sat:      98 % on Room air Temp:     96.7 degrees F oral Pulse rate:   68 / minute BP sitting:   140 / 80  (left arm) Cuff size:   regular  Vitals Entered ByZella Ball Ewing (March 16, 2009 9:19 AM)  O2 Flow:  Room air  CC: Yearly/RE   Primary Care Provider:  Dr.Keysean  CC:  Yearly/RE.  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) 4)  Hypertension  (ICD-401.9) 5)  Back Pain  (ICD-724.5) 6)  Chest Pain  (ICD-786.50) 7)  Ear Pain, Left  (ICD-388.70) 8)  Preventive Health Care  (ICD-V70.0) 9)  Unspecified Hearing Loss  (ICD-389.9) 10)  Sinusitis- Acute-nos  (ICD-461.9) 11)  Colonic Polyps, Hx of  (ICD-V12.72) 12)  Abuse, Alcohol, 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 1po Once Daily 2)  Simvastatin 40 Mg  Tabs (Simvastatin) .[name].. 1po Once Daily 3)  Multivitamins   Tabs (Multiple Vitamin) .... Take 1 Tablet By Mouth Once A Day 4)  Calcium 600 Mg  Tabs (Calcium) .... Take 1 Tablet By Mouth Once A Day 5)  Vitamin C 500 Mg  Tabs  (Ascorbic Acid) .... Take 1 Tablet By Mouth Once A Day 6)  Cardizem La 360 Mg Xr24h-Tab (Diltiazem Hcl Coated Beads) .... One 7)  Warfarin Sodium 5 Mg Tabs (Warfarin Sodium) .... Take As Directed By Coumadin Clinic. 8)  Oxycodone-Acetaminophen 5-325 Mg Tabs (Oxycodone-Acetaminophen) .[name].. 1 By Mouth Once Daily As Needed  Current Medications (verified): 1)  Diovan 80 Mg Tabs (Valsartan) .[name].. 1po Once Daily 2)  Simvastatin 40 Mg  Tabs (Simvastatin) .[name].. 1po Once Daily 3)  Multivitamins   Tabs (Multiple Vitamin) .... Take 1 Tablet By Mouth Once A Day 4)  Calcium 600 Mg  Tabs (Calcium) .... Take 1 Tablet By Mouth Once A Day 5)  Vitamin C 500 Mg  Tabs (Ascorbic Acid) .... Take 1 Tablet By Mouth Once A Day 6)  Cardizem La 360 Mg Xr24h-Tab (Diltiazem Hcl Coated Beads) .[name].. 1 By Mouth Once Daily 7)  Warfarin Sodium 5 Mg Tabs (Warfarin Sodium) .... Take As Directed By Coumadin Clinic. 8)  Hydrocodone-Acetaminophen 5-325 Mg Tabs (Hydrocodone-Acetaminophen) .[name].. 1 By Mouth Once Daily As Needed Pain 9)  Flomax 0.4 Mg Caps (Tamsulosin Hcl) .... 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) ERECTILE DYSFUNCTION (ICD-302.72) BENIGN PROSTATIC HYPERTROPHY (ICD-600.00) ALLERGIC RHINITIS (ICD-477.9) MELANOMA, MALIGNANT, SKIN NOS (ICD-172.9) OSTEOARTHROSIS NOS, OTHER SPEC SITE (ICD-715.98) VENTRICULAR TACHYCARDIA (ICD-427.1) HYPERTENSION (ICD-401.9) HYPERLIPIDEMIA (ICD-272.4)  Past Surgical History: Last updated: 03/14/2008 Rotator cuff repair - left Total knee replacement - right Tonsillectomy s/p left knee arthrosocopy  Family History: Last updated:  04/26/2008 brother with tachyarrythmia, DM father with esophageal cancer mother with chf brother with dialysis/bright disease  He has a brother and sister who both have arrhythmias.   His brother has been ablated and his sister had syncope.   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  He is retired from   Airline pilot.  He quit smoking 20 years ago.  He quit drinking 20 years ago.   No drugs.   Risk Factors: Smoking Status: quit (04/08/2007)  Review of Systems  The patient denies anorexia, fever, weight loss, weight gain, vision loss, decreased hearing, hoarseness, chest pain, syncope, dyspnea on exertion, peripheral edema, prolonged cough, headaches, hemoptysis, abdominal pain, melena, hematochezia, severe indigestion/heartburn, hematuria, incontinence, muscle weakness, suspicious skin lesions, transient blindness, difficulty walking, depression, unusual weight change, abnormal bleeding, enlarged lymph nodes, and angioedema.         all otherwise negative per pt - 12 system review done  - except has a small skin lesion to the right pinna; also with mild urinary hesitancy symptoms and some minor retention causing frequency  Physical Exam  General:  alert and well-developed.   Head:  normocephalic and atraumatic.   Eyes:  vision grossly intact, pupils equal, and pupils round.   Ears:  R ear normal and L ear normal.   Nose:  no external deformity and no nasal discharge.   Mouth:  no gingival abnormalities and pharynx pink and moist.   Neck:  supple and no masses.   Lungs:  normal respiratory effort and normal breath sounds.   Heart:  normal rate and regular rhythm.   Abdomen:  soft, non-tender, and normal bowel sounds.   Msk:  no joint tenderness and no joint swelling.   Extremities:  no edema, no erythema  Neurologic:  cranial nerves II-XII intact and strength normal in all extremities.       Impression & Recommendations:  Problem # 1:  Preventive Health Care (ICD-V70.0)  Overall doing well, age appropriate education and counseling updated and referral for appropriate preventive services done unless declined, immunizations up to date or declined, diet counseling done if overweight, urged to quit smoking if smokes , most recent labs reviewed and current ordered if appropriate, ecg reviewed or declined (interpretation per ECG scanned in the EMR if done); information regarding Medicare Prevention requirements given if appropriate   Orders: TLB-BMP (Basic Metabolic Panel-BMET) (80048-METABOL) TLB-CBC Platelet - w/Differential (85025-CBCD) TLB-Hepatic/Liver Function Pnl (80076-HEPATIC) TLB-Lipid Panel (80061-LIPID) TLB-TSH (Thyroid Stimulating Hormone) (84443-TSH) TLB-PSA (Prostate Specific Antigen) (84153-PSA) TLB-Udip ONLY (81003-UDIP)  Problem # 2:  SKIN LESION (ICD-709.9) right ear - ? basal cell  - to f/u derm as he already has planned  Problem # 3:  BENIGN PROSTATIC HYPERTROPHY (ICD-600.00)  with mild incr retention - to chekc labs as above, start flomax daily  His updated medication list for this problem includes:    Flomax 0.4 Mg Caps (Tamsulosin hcl) .[name]... Generic - 1 by mouth once daily  Problem # 4:  HYPERTENSION (ICD-401.9)  His updated medication list for this problem includes:    Diovan 80 Mg Tabs (Valsartan) .[name]... 1po once daily    Cardizem La 360 Mg Xr24h-tab (Diltiazem hcl coated beads) .[name]... 1 by mouth once daily  BP today: 140/80 Prior BP: 134/76 (10/02/2008)  Labs Reviewed: K+: 4.5 (05/23/2008) Creat: : 0.9 (05/23/2008)   Chol: 125 (03/14/2008)   HDL: 50.2 (03/14/2008)   LDL: 66 (03/14/2008)   TG: 44 (03/14/2008) stable overall by hx and exam, ok to continue meds/tx as is   Complete Medication List: 1)  Diovan 80 Mg Tabs (Valsartan) .[name].. 1po once daily 2)  Simvastatin 40 Mg Tabs (Simvastatin) .[name].. 1po once daily 3)  Multivitamins Tabs  (Multiple vitamin) .... Take 1 tablet by mouth once a day 4)  Calcium 600 Mg Tabs (Calcium) .... Take 1 tablet by mouth once a day 5)  Vitamin C 500 Mg Tabs (Ascorbic acid) .... Take 1 tablet by mouth once a day 6)  Cardizem La 360 Mg Xr24h-tab (Diltiazem hcl coated beads) .[name].. 1 by mouth once daily 7)  Warfarin Sodium 5 Mg Tabs (Warfarin sodium) .... Take as directed by coumadin clinic. 8)  Hydrocodone-acetaminophen 5-325 Mg Tabs (Hydrocodone-acetaminophen) .[name].. 1 by mouth once daily as needed pain 9)  Flomax 0.4 Mg Caps (Tamsulosin hcl) .... Generic - 1 by mouth once daily  Other Orders: Flu Vaccine 21yrs + (16109) Administration Flu vaccine - MCR (U0454)  Patient Instructions: 1)  you had the flu shot today 2)  please see your dermatologist as you have planned 3)  start the generic flomax for the prostate symptoms 4)  Continue all previous medications as before this visit  5)  Please go to the Lab in the basement for your blood and/or urine tests today  6)  Please schedule a follow-up appointment in 1 year or sooner if needed Prescriptions: FLOMAX 0.4 MG CAPS (TAMSULOSIN HCL) generic - 1 by mouth once daily  #90 x 3   Entered and Authorized by:   Corwin Levins MD   Signed by:   Corwin Levins MD on 03/16/2009   Method used:   Print then Give to Patient   RxID:   [id number] FLOMAX 0.4 MG CAPS (TAMSULOSIN HCL) generic - 1 by mouth once daily  #90 x 3   Entered and Authorized by:   Corwin Levins MD   Signed by:   Corwin Levins MD on 03/16/2009   Method used:   Print then Give to Patient   RxID:   [id number] DIOVAN 80 MG TABS (VALSARTAN) 1po once daily  #90 x 3   Entered and Authorized by:   Corwin Levins MD   Signed by:   Corwin Levins MD on 03/16/2009   Method used:   Print then Give to Patient   RxID:   [id number] SIMVASTATIN 40 MG  TABS (SIMVASTATIN) 1po once daily  #90 x 3   Entered and Authorized by:   Corwin Levins MD   Signed by:   Corwin Levins MD on 03/16/2009    Method used:   Print then Give to Patient   RxID:   [id number] CARDIZEM LA 360 MG XR24H-TAB (DILTIAZEM HCL COATED BEADS) 1 by mouth once daily  #90 x 3   Entered and Authorized by:   Corwin Levins MD   Signed by:   Corwin Levins MD on 03/16/2009   Method used:   Print then  Give to Patient   RxID:   [id number] HYDROCODONE-ACETAMINOPHEN 5-325 MG TABS (HYDROCODONE-ACETAMINOPHEN) 1 by mouth once daily as needed pain  #30 x 2   Entered and Authorized by:   Corwin Levins MD   Signed by:   Corwin Levins MD on 03/16/2009   Method used:   Print then Give to Patient   RxID:   [id number]     Flu Vaccine Consent Questions     Do you have a history of severe allergic reactions to this vaccine? no    Any prior history of allergic reactions to egg and/or gelatin? no    Do you have a sensitivity to the preservative Thimersol? no    Do you have a past history of Guillan-Barre Syndrome? no    Do you currently have an acute febrile illness? no    Have you ever had a severe reaction to latex? no    Vaccine information given and explained to patient? yes    Are you currently pregnant? no    Lot Number:[id number]   Exp Date:08/02/2009   Site Given  Left Deltoid IMflu

## 2010-03-15 ENCOUNTER — Telehealth: Payer: Self-pay | Admitting: Internal Medicine

## 2010-03-19 ENCOUNTER — Other Ambulatory Visit: Payer: Self-pay | Admitting: Internal Medicine

## 2010-03-19 ENCOUNTER — Ambulatory Visit (INDEPENDENT_AMBULATORY_CARE_PROVIDER_SITE_OTHER): Payer: 59 | Admitting: Internal Medicine

## 2010-03-19 ENCOUNTER — Encounter (INDEPENDENT_AMBULATORY_CARE_PROVIDER_SITE_OTHER): Payer: Self-pay | Admitting: *Deleted

## 2010-03-19 ENCOUNTER — Encounter: Payer: Self-pay | Admitting: Internal Medicine

## 2010-03-19 ENCOUNTER — Other Ambulatory Visit: Payer: 59

## 2010-03-19 DIAGNOSIS — Z0000 Encounter for general adult medical examination without abnormal findings: Secondary | ICD-10-CM

## 2010-03-19 DIAGNOSIS — E739 Lactose intolerance, unspecified: Secondary | ICD-10-CM | POA: Insufficient documentation

## 2010-03-19 DIAGNOSIS — E785 Hyperlipidemia, unspecified: Secondary | ICD-10-CM

## 2010-03-19 DIAGNOSIS — Z79899 Other long term (current) drug therapy: Secondary | ICD-10-CM

## 2010-03-19 HISTORY — DX: Lactose intolerance, unspecified: E73.9

## 2010-03-19 LAB — HEPATIC FUNCTION PANEL
Alkaline Phosphatase: 87 U/L (ref 39–117)
Bilirubin, Direct: 0.2 mg/dL (ref 0.0–0.3)
Total Bilirubin: 0.7 mg/dL (ref 0.3–1.2)
Total Protein: 6.5 g/dL (ref 6.0–8.3)

## 2010-03-19 LAB — BASIC METABOLIC PANEL
Calcium: 9.3 mg/dL (ref 8.4–10.5)
Creatinine, Ser: 0.9 mg/dL (ref 0.4–1.5)

## 2010-03-19 LAB — CBC WITH DIFFERENTIAL/PLATELET
Basophils Relative: 0.4 % (ref 0.0–3.0)
Eosinophils Absolute: 0.1 10*3/uL (ref 0.0–0.7)
Eosinophils Relative: 1.3 % (ref 0.0–5.0)
Lymphocytes Relative: 48.8 % — ABNORMAL HIGH (ref 12.0–46.0)
MCHC: 33.8 g/dL (ref 30.0–36.0)
Neutrophils Relative %: 41.2 % — ABNORMAL LOW (ref 43.0–77.0)
RBC: 4.56 Mil/uL (ref 4.22–5.81)
WBC: 6.6 10*3/uL (ref 4.5–10.5)

## 2010-03-19 LAB — URINALYSIS
Ketones, ur: NEGATIVE
Specific Gravity, Urine: 1.02 (ref 1.000–1.030)
Total Protein, Urine: NEGATIVE
Urine Glucose: NEGATIVE
pH: 6 (ref 5.0–8.0)

## 2010-03-19 LAB — LIPID PANEL
Cholesterol: 131 mg/dL (ref 0–200)
Triglycerides: 60 mg/dL (ref 0.0–149.0)

## 2010-03-19 LAB — TSH: TSH: 0.71 u[IU]/mL (ref 0.35–5.50)

## 2010-03-21 NOTE — Progress Notes (Signed)
Summary: question  Phone Note Call from Patient Call back at [facility] Phone [phone] Call back at [phone] cell   Caller: Wife Summary of Call: Per wife pt received call this am stating for pt to d/c ATORVASTATIN and start lipitor due to interaction with bp med. She said that they were told about this six mos ago but were advised that it would be ok to continue to take both meds as long as they are taken at different times of the day. Wife would like to know if new information has been released which prompted the phone call earlier. Please advise Initial call taken by: Rock Nephew CMA,  March 15, 2010 1:44 PM  Follow-up for Phone Call        no, I would go ahead and change to the lipitor due to the FDA recommendation Follow-up by: Corwin Levins MD,  March 15, 2010 1:53 PM  Additional Follow-up for Phone Call Additional follow up Details #1::        pt's spouse informed of MD's advisement. Pt's spouse states that pt will discuss further with JWJ at upcoming appt. Additional Follow-up by: Brenton Grills CMA Duncan Dull),  March 15, 2010 2:10 PM

## 2010-03-27 NOTE — Assessment & Plan Note (Signed)
Summary: YEARLY FU MEDICARE / CD   Vital Signs:  Patient profile:   77 year old male Height:      72 inches Weight:      187.75 pounds BMI:     25.56 O2 Sat:      93 % on Room air Temp:     97.5 degrees F oral Pulse rate:   76 / minute BP sitting:   122 / 72  (left arm) Cuff size:   regular  Vitals Entered By: Zella Ball Ewing CMA (AAMA) (March 19, 2010 9:15 AM)  O2 Flow:  Room air  CC: Yearly/RE   Primary Care Provider:  Dr.Hilario  CC:  Yearly/RE.  History of Present Illness: here for wellness;  overall doing ok;  Pt denies CP, worsening sob, doe, wheezing, orthopnea, pnd, worsening LE edema, palps, dizziness or syncope  .enuro  Pt denies polydipsia   Overall good compliance with meds, trying to follow low chol diet, wt stable, little excercise however .  Overall good compliance with meds, and good tolerability.  No fever, wt loss, night sweats, loss of appetite or other constitutional symptoms  Denies worsening depressive symptoms, suicidal ideation, or panic.   Pt states good ability with ADL's, low fall risk, home safety reviewed and adequate, no significant change in hearing or vision, trying to follow lower chol diet, and occasionally active only with regular excercise.   [facility] - mostly 30 min on the track, and some light wts.    Preventive Screening-Counseling & Management      Drug Use:  no.    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) 9)  Hypertension  (ICD-401.9) 10)  Back Pain  (ICD-724.5) 11)  Chest Pain  (ICD-786.50) 12)  Ear Pain, Left  (ICD-388.70) 13)  Preventive Health Care  (ICD-V70.0) 14)  Unspecified Hearing Loss  (ICD-389.9) 15)  Sinusitis-  Acute-nos  (ICD-461.9) 16)  Colonic Polyps, Hx of  (ICD-V12.72) 17)  Abuse, Alcohol, 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 Take 1 Tablet By Mouth Once A Day 2)  Calcium 600 Mg  Tabs (Calcium) .... Take 1 Tablet By Mouth Once A Day 3)  Vitamin C 500 Mg  Tabs (Ascorbic Acid) .... Take 1 Tablet By Mouth Once A Day 4)  Cardizem La 360 Mg Xr24h-Tab (Diltiazem Hcl Coated Beads) .[name].. 1 By Mouth Once Daily 5)  Warfarin Sodium 5 Mg Tabs (Warfarin Sodium) .... Take As Directed By Coumadin Clinic. 6)  Hydrocodone-Acetaminophen 5-325 Mg Tabs (Hydrocodone-Acetaminophen) .[name].. 1 By Mouth Once Daily As Needed Pain 7)  Flomax 0.4 Mg Caps (Tamsulosin Hcl) .... Generic - 1 By Mouth Once Daily-Pt Not Going To Continue 8)  Atorvastatin Calcium 10 Mg Tabs (Atorvastatin  Calcium) .[name].. 1po Once Daily 9)  Diovan 80 Mg Tabs (Valsartan) .... Take One Tablet By Mouth Daily  Current Medications (verified): 1)  Multivitamins   Tabs (Multiple Vitamin) .... Take 1 Tablet By Mouth Once A Day 2)  Calcium 600 Mg  Tabs (Calcium) .... Take 1 Tablet By Mouth Once A Day 3)  Vitamin C 500 Mg  Tabs (Ascorbic Acid) .... Take 1 Tablet By Mouth Once A Day 4)  Cardizem La 360 Mg Xr24h-Tab (Diltiazem Hcl Coated Beads) .[name].. 1 By Mouth Once Daily 5)  Warfarin Sodium 5 Mg Tabs (Warfarin Sodium) .... Take As Directed By Coumadin Clinic. 6)  Hydrocodone-Acetaminophen 5-325 Mg Tabs (Hydrocodone-Acetaminophen) .[name].. 1 By Mouth Once Daily As Needed Pain 7)   Flomax 0.4 Mg Caps (Tamsulosin Hcl) .... Generic - 1 By Mouth Once Daily-Pt Not Going To Continue 8)  Atorvastatin Calcium 10 Mg Tabs (Atorvastatin Calcium) .[name].. 1po Once Daily 9)  Diovan 80 Mg Tabs (Valsartan) .... Take One Tablet By Mouth Daily  Allergies (verified): 1)  Ace Inhibitors 2)  Amiodarone Hcl  Past History:  Past Surgical History: Last updated: 03/14/2008 Rotator cuff repair - left Total knee replacement - right Tonsillectomy s/p left knee arthrosocopy  Family History: Last updated: 04/26/2008 brother with tachyarrythmia, DM father with esophageal cancer mother with chf brother with dialysis/bright disease  He has a brother and sister who both have arrhythmias.   His brother has been ablated and his sister had syncope.   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  He is retired from   Airline pilot.  He quit smoking 20 years ago.  He quit drinking 20 years ago.   No drugs.  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  He is retired from   Airline pilot.  He quit smoking 20 years ago.  He quit drinking 20 years ago.   No drugs.  Drug use-no Drug Use:  no  Review of Systems  The  patient denies anorexia, fever, vision loss, decreased hearing, hoarseness, chest pain, syncope, dyspnea on exertion, peripheral edema, prolonged cough, headaches, hemoptysis, abdominal pain, melena, hematochezia, severe indigestion/heartburn, hematuria, muscle weakness, suspicious skin lesions, transient blindness, difficulty walking, depression, unusual weight change, abnormal bleeding, enlarged lymph nodes, and angioedema.         all otherwise negative per pt -    Physical Exam  General:  alert and well-developed.   Head:  normocephalic and atraumatic.   Eyes:  vision grossly intact, pupils equal, and pupils round.   Ears:  R ear normal and L ear normal.  , canals clear Nose:  no external deformity and no nasal discharge.   Mouth:  no gingival abnormalities and pharynx pink and moist.   Neck:  supple and no masses.   Lungs:  normal respiratory effort and normal breath sounds.   Heart:  normal rate and regular rhythm.   Abdomen:  soft, non-tender, and normal bowel sounds.   Msk:  no  joint tenderness and no joint swelling.   Extremities:  no edema, no erythema  Neurologic:  strength normal in all extremities and gait normal.   Skin:  color normal and no rashes.   Psych:  not depressed appearing and moderately anxious.     Impression & Recommendations:  Problem # 1:  Preventive Health Care (ICD-V70.0) Overall doing well, age appropriate education and counseling updated, referral for preventive services and immunizations addressed, dietary counseling and smoking status adressed , most recent labs reviewed I have personally reviewed and have noted 1.The patient's medical and social history 2.Their use of alcohol, tobacco or illicit drugs 3.Their current medications and supplements 4. Functional ability including ADL's, fall risk, home safety risk, hearing & visual impairment  5.Diet and physical activities 6.Evidence for depression or mood disorders The patients weight, height, BMI   have been recorded in the chart I have made referrals, counseling and provided education to the patient based review of the above  Orders: TLB-BMP (Basic Metabolic Panel-BMET) (80048-METABOL) TLB-CBC Platelet - w/Differential (85025-CBCD) TLB-Hepatic/Liver Function Pnl (80076-HEPATIC) TLB-TSH (Thyroid Stimulating Hormone) (84443-TSH) TLB-Lipid Panel (80061-LIPID) TLB-Udip ONLY (81003-UDIP)  Problem # 2:  HYPERTENSION (ICD-401.9)  His updated medication list for this problem includes:    Cardizem La 360 Mg Xr24h-tab (Diltiazem hcl coated beads) .[name]... 1 by mouth once daily    Diovan 80 Mg Tabs (Valsartan) .[name]... Take one tablet by mouth daily  BP today: 122/72 Prior BP: 110/68 (01/29/2010)  Labs Reviewed: K+: 4.7 (01/08/2010) Creat: : 0.8 (01/08/2010)   Chol: 148 (11/16/2009)   HDL: 52.80 (11/16/2009)   LDL: 78 (11/16/2009)   TG: 84.0 (11/16/2009) stable overall by hx and exam, ok to continue meds/tx as is   Problem # 3:  HYPERLIPIDEMIA (ICD-272.4)  His updated medication list for this problem includes:    Atorvastatin Calcium 10 Mg Tabs (Atorvastatin calcium) .[name]... 1po once daily  Labs Reviewed: SGOT: 27 (11/16/2009)   SGPT: 28 (11/16/2009)   HDL:52.80 (11/16/2009), 56.00 (03/16/2009)  LDL:78 (11/16/2009), 59 (03/16/2009)  Chol:148 (11/16/2009), 126 (03/16/2009)  Trig:84.0 (11/16/2009), 53.0 (03/16/2009) stable overall by hx and exam, ok to continue meds/tx as is - to cont the lipitor  Complete Medication List: 1)  Multivitamins Tabs (Multiple vitamin) .... Take 1 tablet by mouth once a day 2)  Calcium 600 Mg Tabs (Calcium) .... Take 1 tablet by mouth once a day 3)  Vitamin C 500 Mg Tabs (Ascorbic acid) .... Take 1 tablet by mouth once a day 4)  Cardizem La 360 Mg Xr24h-tab (Diltiazem hcl coated beads) .[name].. 1 by mouth once daily 5)  Warfarin Sodium 5 Mg Tabs (Warfarin sodium) .... Take as directed by coumadin clinic. 6)  Hydrocodone-acetaminophen 5-325 Mg Tabs  (Hydrocodone-acetaminophen) .[name].. 1 by mouth once daily as needed pain 7)  Flomax 0.4 Mg Caps (Tamsulosin hcl) .... Generic - 1 by mouth once daily-pt not going to continue 8)  Atorvastatin Calcium 10 Mg Tabs (Atorvastatin calcium) .[name].. 1po once daily 9)  Diovan 80 Mg Tabs (Valsartan) .... Take one tablet by mouth daily  Patient Instructions: 1)  Continue all previous medications as before this visit , including the lipitor 2)  Please go to the Lab in the basement for your blood and/or urine tests today 3)  Please call the number on the [facility] Card for results of your testing  4)  You are otherwise up to date with prevention today 5)  Please schedule a follow-up appointment in 6 months, or sooner if needed, with: 6)  BMP prior to visit, ICD-9: 790.2 7)  Lipid Panel prior to visit, ICD-9: 8)  HbgA1C prior to visit, ICD-9: 9)  Hepatic Panel prior to visit, ICD-9: v58.69   Orders Added: 1)  TLB-BMP (Basic Metabolic Panel-BMET) [80048-METABOL] 2)  TLB-CBC Platelet - w/Differential [85025-CBCD] 3)  TLB-Hepatic/Liver Function Pnl [80076-HEPATIC] 4)  TLB-TSH (Thyroid Stimulating Hormone) [84443-TSH] 5)  TLB-Lipid Panel [80061-LIPID] 6)  TLB-Udip ONLY [81003-UDIP] 7)  Est. Patient 65& > [16109]

## 2010-03-28 DIAGNOSIS — I4892 Unspecified atrial flutter: Secondary | ICD-10-CM

## 2010-03-28 HISTORY — DX: Unspecified atrial flutter: I48.92

## 2010-03-29 ENCOUNTER — Encounter: Payer: Self-pay | Admitting: Internal Medicine

## 2010-03-29 ENCOUNTER — Encounter (INDEPENDENT_AMBULATORY_CARE_PROVIDER_SITE_OTHER): Payer: 59

## 2010-03-29 DIAGNOSIS — Z7901 Long term (current) use of anticoagulants: Secondary | ICD-10-CM

## 2010-03-29 DIAGNOSIS — I4891 Unspecified atrial fibrillation: Secondary | ICD-10-CM

## 2010-03-29 LAB — CONVERTED CEMR LAB: POC INR: 2.1

## 2010-04-02 NOTE — Medication Information (Signed)
Summary: rov/tm  Anticoagulant Therapy  Managed by: Cloyde Reams, RN, BSN Referring MD: Sherryl Manges MD PCP: Dr.Shermaine Supervising MD: Tenny Craw MD, Gunnar Fusi Indication 1: Atrial Fibrillation (ICD-427.31) Lab Used: [facility] Site: [facility] INR POC 2.1 INR RANGE 2 - 3  Dietary changes: no    Health status changes: no    Bleeding/hemorrhagic complications: no    Recent/future hospitalizations: no    Any changes in medication regimen? yes       Details: Changed from Simvastatin to Liptior.   Recent/future dental: no  Any missed doses?: no         Allergies: 1)  Ace Inhibitors 2)  Amiodarone Hcl  Anticoagulation Management History:      The patient is taking warfarin and comes in today for a routine follow up visit.  Positive risk factors for bleeding include an age of 75 years or older.  The bleeding index is 'intermediate risk'.  Positive CHADS2 values include History of HTN and Age > 22 years old.  The start date was 03/06/2008.  His last INR was 2.5 ratio.  Anticoagulation responsible provider: Tenny Craw MD, Gunnar Fusi.  INR POC: 2.1.  Cuvette Lot#: [id number].  Exp: 02/2011.    Anticoagulation Management Assessment/Plan:      The patient's current anticoagulation dose is Warfarin sodium 5 mg tabs: Take as directed by coumadin clinic..  The target INR is 2 - 3.  The next INR is due 04/26/2010.  Anticoagulation instructions were given to patient.  Results were reviewed/authorized by Cloyde Reams, RN, BSN.  He was notified by Cloyde Reams RN.         Prior Anticoagulation Instructions: INR 2.4 Continue 5mg s daily except 7.5mg s on Mondays and Fridays. Recheck in 4 weeks.   Current Anticoagulation Instructions: INR 2.1  Continue on same dosage 1 tablet daily except 1.5 tablets on Mondays and Fridays.  Recheck in 4 weeks.

## 2010-04-03 ENCOUNTER — Telehealth: Payer: Self-pay | Admitting: Internal Medicine

## 2010-04-11 NOTE — Progress Notes (Signed)
Summary: rx refill  Phone Note Refill Request Call back at Home Phone [phone] Message from:  Patient on April 03, 2010 11:04 AM  Refills Requested: Medication #1:  WARFARIN SODIUM 5 MG TABS Take as directed by coumadin clinic. please fax it to [facility]. pt needs 100 tablets and 90 days supply   Method Requested: Fax to Local Pharmacy Initial call taken by: Roe Coombs,  April 03, 2010 11:04 AM    Prescriptions: WARFARIN SODIUM 5 MG TABS (WARFARIN SODIUM) Take as directed by coumadin clinic.  #100 x 1   Entered by:   Weston Brass PharmD   Authorized by:   Nathen May, MD, [facility]   Signed by:   Weston Brass PharmD on 04/03/2010   Method used:   Electronically to        [facility]* (retail)             ,          Ph: [phone]       Fax: [phone]   RxID:   [id number]

## 2010-04-26 ENCOUNTER — Ambulatory Visit (INDEPENDENT_AMBULATORY_CARE_PROVIDER_SITE_OTHER): Payer: 59 | Admitting: *Deleted

## 2010-04-26 DIAGNOSIS — I4892 Unspecified atrial flutter: Secondary | ICD-10-CM

## 2010-04-26 DIAGNOSIS — Z7901 Long term (current) use of anticoagulants: Secondary | ICD-10-CM

## 2010-04-26 HISTORY — DX: Long term (current) use of anticoagulants: Z79.01

## 2010-04-26 LAB — POCT INR: INR: 2.6

## 2010-04-26 NOTE — Patient Instructions (Signed)
Continue on same dosage 1 tablet daily except 1.5 tablets on Mondays and Fridays.  Recheck in 4 weeks.  

## 2010-05-15 LAB — PROTIME-INR
INR: 2.1 — ABNORMAL HIGH (ref 0.00–1.49)
Prothrombin Time: 24.6 seconds — ABNORMAL HIGH (ref 11.6–15.2)

## 2010-05-21 ENCOUNTER — Encounter: Payer: Self-pay | Admitting: Internal Medicine

## 2010-05-21 LAB — COMPREHENSIVE METABOLIC PANEL
AST: 24 U/L (ref 0–37)
CO2: 27 mEq/L (ref 19–32)
Calcium: 9.4 mg/dL (ref 8.4–10.5)
Creatinine, Ser: 0.78 mg/dL (ref 0.4–1.5)
GFR calc Af Amer: >60 mL/min (ref >60)
GFR calc non Af Amer: >60 mL/min (ref >60)
Total Protein: 6.9 g/dL (ref 6.0–8.3)

## 2010-05-21 LAB — LIPID PANEL
LDL Cholesterol: 80 mg/dL (ref 0–99)
Triglycerides: 44 mg/dL (ref <150)

## 2010-05-21 LAB — HEPARIN LEVEL (UNFRACTIONATED): Heparin Unfractionated: 0.38 IU/mL (ref 0.30–0.70)

## 2010-05-21 LAB — APTT: aPTT: 29 seconds (ref 24–37)

## 2010-05-21 LAB — PROTIME-INR: Prothrombin Time: 13 seconds (ref 11.6–15.2)

## 2010-05-21 LAB — BASIC METABOLIC PANEL
Chloride: 104 mEq/L (ref 96–112)
GFR calc Af Amer: >60 mL/min (ref >60)
Potassium: 4.2 mEq/L (ref 3.5–5.1)

## 2010-05-21 LAB — POCT CARDIAC MARKERS
CKMB, poc: 1.4 ng/mL (ref 1.0–8.0)
Troponin i, poc: <0.05 ng/mL (ref 0.00–0.09)
Troponin i, poc: <0.05 ng/mL (ref 0.00–0.09)

## 2010-05-21 LAB — CBC
Hemoglobin: 15 g/dL (ref 13.0–17.0)
MCHC: 34.4 g/dL (ref 30.0–36.0)
MCV: 93.2 fL (ref 78.0–100.0)
Platelets: 234 10*3/uL (ref 150–400)
RBC: 4.69 MIL/uL (ref 4.22–5.81)
RBC: 4.61 MIL/uL (ref 4.22–5.81)
RDW: 12.9 % (ref 11.5–15.5)
RDW: 12.8 % (ref 11.5–15.5)
WBC: 6.2 10*3/uL (ref 4.0–10.5)

## 2010-05-21 LAB — DIFFERENTIAL
Basophils Absolute: 0 10*3/uL (ref 0.0–0.1)
Eosinophils Relative: 2 % (ref 0–5)
Lymphocytes Relative: 44 % (ref 12–46)
Lymphocytes Relative: 44 % (ref 12–46)
Lymphs Abs: 3.8 10*3/uL (ref 0.7–4.0)
Monocytes Absolute: 0.9 10*3/uL (ref 0.1–1.0)
Monocytes Relative: 9 % (ref 3–12)
Neutro Abs: 4.1 10*3/uL (ref 1.7–7.7)
Neutrophils Relative %: 45 % (ref 43–77)

## 2010-05-21 LAB — TSH: TSH: 0.788 u[IU]/mL (ref 0.350–4.500)

## 2010-05-21 LAB — CARDIAC PANEL(CRET KIN+CKTOT+MB+TROPI)
CK, MB: 2.3 ng/mL (ref 0.3–4.0)
Relative Index: 2 (ref 0.0–2.5)
Relative Index: 2.1 (ref 0.0–2.5)
Total CK: 115 U/L (ref 7–232)
Troponin I: 0.01 ng/mL (ref 0.00–0.06)
Troponin I: <0.01 ng/mL (ref 0.00–0.06)

## 2010-05-21 LAB — TROPONIN I: Troponin I: <0.01 ng/mL (ref 0.00–0.06)

## 2010-05-22 ENCOUNTER — Ambulatory Visit: Payer: 59 | Admitting: Internal Medicine

## 2010-05-23 ENCOUNTER — Ambulatory Visit (INDEPENDENT_AMBULATORY_CARE_PROVIDER_SITE_OTHER): Payer: 59 | Admitting: *Deleted

## 2010-05-23 DIAGNOSIS — I4892 Unspecified atrial flutter: Secondary | ICD-10-CM

## 2010-05-24 ENCOUNTER — Encounter: Payer: Self-pay | Admitting: Internal Medicine

## 2010-05-24 ENCOUNTER — Encounter: Payer: 59 | Admitting: *Deleted

## 2010-05-24 ENCOUNTER — Ambulatory Visit (INDEPENDENT_AMBULATORY_CARE_PROVIDER_SITE_OTHER): Payer: 59 | Admitting: Internal Medicine

## 2010-05-24 VITALS — BP 126/78 | HR 74 | Temp 97.5°F | Ht 72.0 in | Wt 188.5 lb

## 2010-05-24 DIAGNOSIS — Z0000 Encounter for general adult medical examination without abnormal findings: Secondary | ICD-10-CM

## 2010-05-24 DIAGNOSIS — R7302 Impaired glucose tolerance (oral): Secondary | ICD-10-CM | POA: Insufficient documentation

## 2010-05-24 DIAGNOSIS — L03115 Cellulitis of right lower limb: Secondary | ICD-10-CM | POA: Insufficient documentation

## 2010-05-24 DIAGNOSIS — L02419 Cutaneous abscess of limb, unspecified: Secondary | ICD-10-CM

## 2010-05-24 DIAGNOSIS — Z0001 Encounter for general adult medical examination with abnormal findings: Secondary | ICD-10-CM | POA: Insufficient documentation

## 2010-05-24 DIAGNOSIS — I10 Essential (primary) hypertension: Secondary | ICD-10-CM

## 2010-05-24 DIAGNOSIS — R7309 Other abnormal glucose: Secondary | ICD-10-CM

## 2010-05-24 MED ORDER — DOXYCYCLINE MONOHYDRATE 100 MG PO CAPS: 100.0000 mg | ORAL_CAPSULE | Freq: Two times a day (BID) | ORAL | Status: AC

## 2010-05-24 NOTE — Assessment & Plan Note (Signed)
stable overall by hx and exam, most recent lab reviewed with pt, and pt to continue medical treatment as before  Lab Results  Component Value Date   WBC 6.6 03/19/2010   HGB 14.6 03/19/2010   HCT 43.1 03/19/2010   PLT 230.0 03/19/2010   CHOL 131 03/19/2010   TRIG 60.0 03/19/2010   HDL 45.70 03/19/2010   LDLDIRECT 159.2 09/29/2006   ALT 21 03/19/2010   AST 23 03/19/2010   NA 139 03/19/2010   K 4.5 03/19/2010   CL 105 03/19/2010   CREATININE 0.9 03/19/2010   BUN 12 03/19/2010   CO2 30 03/19/2010   TSH 0.71 03/19/2010   PSA 1.96 03/16/2009   INR 2.6 05/23/2010

## 2010-05-24 NOTE — Progress Notes (Signed)
Subjective:    Patient ID: Jonathan Orozco, male    DOB: 1928/02/09, 77 y.o.   MRN: [id number]  HPI Here with acute onset mild to mod 2-3 days redness, tender, swelling and slight drainage to right mid leg lesion despite OTC topical antibx.  No f/c, red streaks, other leg swelling and no change in gait due to pain.  Has ? Low grade temp.  Pt denies chest pain, increased sob or doe, wheezing, orthopnea, PND, increased LE swelling, palpitations, dizziness or syncope.  Pt denies new neurological symptoms such as new headache, or facial or extremity weakness or numbness  Pt denies polydipsia, polyuria   Pt states overall good compliance with meds, trying to follow lower cholesterol  diet, wt overall stable but little exercise however.     Denies worsening depressive symptoms, suicidal ideation, or panic, though has ongoing anxiety, not increased recently.   Overall good compliance with treatment, and good medicine tolerability.  Pt denies high fever, wt loss, night sweats, loss of appetite, or other constitutional symptoms  Past Medical History  Diagnosis Date  . MELANOMA, MALIGNANT, SKIN NOS 09/29/2006  . HYPERLIPIDEMIA 09/29/2006  . ERECTILE DYSFUNCTION 09/29/2006  . 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 ABUSE, ALCOHOL, IN REMISSION 04/08/2007  . Other specified forms of hearing loss 08/10/2009  . HYPERTENSION 09/29/2006  . VENTRICULAR TACHYCARDIA 09/29/2006  . SINUSITIS- ACUTE-NOS 06/23/2007  . ALLERGIC RHINITIS 09/29/2006  . BENIGN PROSTATIC HYPERTROPHY 09/29/2006  . OSTEOARTHROSIS NOS, OTHER [facility] SITE 09/29/2006  . BACK PAIN 09/12/2008  . LEG PAIN, RIGHT 10/02/2008  . VERTIGO 04/08/2007  . COLONIC POLYPS, HX OF 06/23/2007  . CHEST PAIN 07/04/2008  . FATIGUE 11/16/2009  . VIRAL GASTROENTERITIS 01/29/2010  . GLUCOSE INTOLERANCE 03/19/2010  . Atrial flutter 03/28/2010  . Encounter for long-term (current) use of anticoagulants 04/26/2010   Past Surgical History  Procedure Date  . Left rotato cuff   .  Joint replacement     Right knee  . Tonsillectomy   . Left knee arthroscopy     reports that he has quit smoking. He does not have any smokeless tobacco history on file. He reports that he does not drink alcohol or use illicit drugs. family history includes Diabetes in his brother. Allergies  Allergen Reactions  . Ace Inhibitors     REACTION: cough  . Amiodarone Hcl     REACTION: intolerance   Current Outpatient Prescriptions on File Prior to Visit  Medication Sig Dispense Refill  . Ascorbic Acid (VITAMIN C) 500 MG tablet Take 500 mg by mouth daily.        [name] atorvastatin (LIPITOR) 10 MG tablet Take 10 mg by mouth daily.        [name] diltiazem (CARDIZEM LA) 360 MG 24 hr tablet Take 360 mg by mouth daily.        [name] HYDROcodone-acetaminophen (NORCO) 5-325 MG per tablet Take 1 tablet by mouth every 6 (six) hours as needed.        . Multiple Vitamin (MULTIVITAMIN) tablet Take 1 tablet by mouth daily.        . Tamsulosin HCl (FLOMAX) 0.4 MG CAPS Take by mouth.        . valsartan (DIOVAN) 80 MG tablet Take 80 mg by mouth daily.        [name] warfarin (COUMADIN) 5 MG tablet Take by mouth as directed.         Review of Systems All otherwise neg per pt     Objective:  Physical Exam BP 126/78  Pulse 74  Temp(Src) 97.5 F (36.4 C) (Oral)  Ht 6' (1.829 m)  Wt 188 lb 8 oz (85.503 kg)  BMI 25.57 kg/m2  SpO2 98% Physical Exam  VS noted Constitutional: Pt appears well-developed and well-nourished.  HENT: Head: Normocephalic.  Right Ear: External ear normal.  Left Ear: External ear normal.  Eyes: Conjunctivae and EOM are normal. Pupils are equal, round, and reactive to light.  Neck: Normal range of motion. Neck supple.  Cardiovascular: Normal rate and regular rhythm.   Pulmonary/Chest: Effort normal and breath sounds normal.  Abd:  Soft, NT, non-distended, + BS Neurological: Pt is alert. No cranial nerve deficit.  Skin: Skin is warm. No erythema. except for right mid leg 3 cm area mild  erythema, tender, swelling, induration with slight drainage pus with manipulation at the center, but very superficial, no fluctuance, no red streaks, no right groin LA Psychiatric: Pt behavior is normal. Thought content normal. 1+ nervous        Assessment & Plan:

## 2010-05-24 NOTE — Assessment & Plan Note (Signed)
stable overall by hx and exam, most recent lab reviewed with pt, and pt to continue medical treatment as before BP Readings from Last 3 Encounters:  05/24/10 126/78  03/19/10 122/72  01/29/10 110/68

## 2010-05-24 NOTE — Assessment & Plan Note (Signed)
Mild, cant r/o mrsa, for doxy course,  to f/u any worsening symptoms or concerns, dont feel labs needed at this time

## 2010-05-24 NOTE — Patient Instructions (Signed)
Take all new medications as prescribed Continue all other medications as before Please return in 9 mo with Lab testing done 3-5 days before  

## 2010-06-04 ENCOUNTER — Other Ambulatory Visit: Payer: Self-pay | Admitting: Internal Medicine

## 2010-06-18 NOTE — Discharge Summary (Signed)
NAMEUTAH, DELAUDER NO.:  [id number]   MEDICAL RECORD NO.:  [id number]          PATIENT TYPE:  INP   LOCATION:  2915                         FACILITY:  [facility]   PHYSICIAN:  Duke Salvia, MD, FACCDATE OF BIRTH:  Jul 12, 1928   DATE OF ADMISSION:  03/06/2008  DATE OF DISCHARGE:  03/07/2008                               DISCHARGE SUMMARY   This patient has no known drug allergies and time for this dictation is  greater than 40 minutes.   FINAL DIAGNOSES:  1. New diagnosis of atrial flutter, predominant with evidenced atrial      fibrillation just prior to conversion to sinus rhythm.  2. Symptoms with atrial flutter:  Anxiety.  3. Electrocardiogram, atrial flutter, right bundle-branch block      pattern, not clearly a typical atrial flutter pattern.   SECONDARY DIAGNOSES:  1. 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      a.     Episodes of ventricular tachycardia are brief and very       sporadic.      b.     Symptoms with ventricular tachycardia, a turning feeling in       the chest which is, per the patient, much different from his       anxious feeling that he gets in new diagnosis atrial flutter.  2. Hypertension.  3. Dyslipidemia.  4. Status post right total knee arthroplasty/left knee arthroscopy.   PROCEDURES:  None at this admission.  The patient converted about  midnight on March 06, 2008, to sinus rhythm with IV Cardizem therapy.   BRIEF HISTORY:  Mr. Deeley is a 77 year old male.  He has a history of  VT.  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  He has stopped amiodarone about 3 years ago in consultation with Dr.  Graciela Husbands because his episodes of VT were so sporadic and so short lived.  He has not had what he calls his turning VT recently.   The patient had been exercising on March 06, 2008.  He came home and  rested and suddenly had an anxious feeling.   He checked his heart  monitor and it showed that his heart rate was 150 and would soar even  higher if he were active.  The patient came to the emergency room.  An  electrocardiogram showed atrial flutter.   HOSPITAL COURSE:  The patient admitted through the emergency room with  symptomatic atrial flutter and anxious feeling.  He was started on IV  Cardizem.  Approximately midnight March 06, 2008, he converted to  sinus rhythm.  The strip showed that just prior to conversion he was  having what looked more like atrial fib.  His atrial flutter does not  look solidly typical either.  He is maintaining sinus rhythm and will go  home on Cardizem and Coumadin therapy.  Coumadin will be started as a  new medication as well this admission.   MEDICATIONS AT DISCHARGE:  1. Cardizem or diltiazem 180 mg daily.  2. Coumadin 5 mg daily or as directed by the Coumadin Clinic.  3. Simvastatin 40 mg daily at bedtime.  4. Diovan 80 mg daily.  This is a new dose down from 160 mg daily.   The patient is asked to check his blood pressure on these 2 medications,  diltiazem and Diovan to make sure that it does not go too low.  He  follows up at [facility].  1. Coumadin Clinic on Friday, March 10, 2008, at noon.  2. To see Dr. Graciela Husbands with an echocardiogram on Wednesday, April 05, 2008, at 2:15.  The echocardiogram is at 1 o'clock.   LABORATORY STUDIES ON THE DAY OF DISCHARGE:  March 07, 2008,  hemoglobin 13.8, hematocrit 41.7, white cells 6.2, and platelets are  234.  Serum electrolytes on the day of discharge, sodium 139, potassium  4.2, chloride 104, carbonate 38, BUN is 7, creatinine 0.91, and glucose  102.  Protime this admission 13.1, INR is 1.0.  Alkaline phosphatase  109, SGOT 24, and SGPT is 19.  Troponin I studies are less than 0.01  then 0.01.  TSH this admission is 0.788.      Maple Mirza, [place]      Duke Salvia, MD, [facility]  Electronically Signed    GM/MEDQ  D:   03/07/2008  T:  03/08/2008  Job:  [id number]   cc:   Duke Salvia, MD, [facility]  Corwin Levins, MD

## 2010-06-18 NOTE — H&P (Signed)
NAMEMUSTAFA, POTTS NO.:  [id number]   MEDICAL RECORD NO.:  [id number]          PATIENT TYPE:  EMS   LOCATION:  MAJO                         FACILITY:  [facility]   PHYSICIAN:  Unice Cobble, MD     DATE OF BIRTH:  09-Oct-1928   DATE OF ADMISSION:  03/06/2008  DATE OF DISCHARGE:                              HISTORY & PHYSICAL   CHIEF COMPLAINT:  Tachycardia.  His cardiologist is Dr. Hurman Horn.   HISTORY OF PRESENT ILLNESS:  This is a 77 year old white male with a  history of VT presumed to be related to his right ventricular outflow  tract on no antiarrhythmics (he stopped his amiodarone four years ago)  presents with tachycardia.  The patient was exercising and noticed on  his heart rate monitor that he had an elevated heart rate that did not  come down when he stopped exercising.  He did not have chest pain,  shortness of breath, or diaphoresis.  Felt a little anxious but tells me  he drinks a lot of coffee.  He came to the ED after speaking with a  nurse practitioner.  No edema, orthopnea, or PND.   PAST MEDICAL HISTORY:  1. History of VT presumed to be RVOT.  2. Right knee replacement.  3. Left knee arthroscopy.  4. Status post right rotator cuff repair.  5. High cholesterol.  6. Hypertension.   ALLERGIES:  No known drug allergies.   MEDICATIONS:  1. Simvastatin 40 mg daily.  2. Valsartan 160 mg daily.   SOCIAL HISTORY:  Lives in [place] with his wife.  He is retired from  [employer].  He quit smoking 20 years ago.  He quit drinking 20 years ago.  No drugs.   FAMILY HISTORY:  He has a brother and sister who both have arrhythmias.  His brother has been ablated and his sister had syncope.   REVIEW OF SYSTEMS:  Complete review of systems done and found to be  otherwise negative except as stated in the HPI.   PHYSICAL EXAMINATION:  VITAL SIGNS:  He is afebrile with a pulse of 151,  respiratory rate is 20, blood pressure is 160/90, O2 sats are 98%  on  room air.  GENERAL:  He is in no acute distress and looks very comfortable.  HEENT:  Shows PERRLA, EOMI, MMM, oropharynx without erythema or  exudates.  NECK:  Supple without lymphadenopathy, thyromegaly, bruits or jugular  venous distention.  HEART:  Has a regular rate and rhythm with a normal S1-S2.  No murmurs,  gallops or rubs.  Normal PMI.  LUNGS:  Clear to auscultation bilaterally.  ABDOMEN:  Soft and nontender with normal bowel sounds.  No rebound or  guarding.  EXTREMITIES:  Show no cyanosis, clubbing or edema.  MUSCULOSKELETAL:  Shows no joint deformity, effusions, spine or CVA  tenderness.  NEUROLOGICAL:  He is alert and oriented x3.  Cranial nerves II-XII  grossly intact and strength is 5/5 all extremities.   LABORATORY AND RADIOLOGY REVIEW:  His labs show a normal white count  with a hemoglobin of 15  and platelet count of 250.  His creatinine is  0.8.  Troponin is less than 0.5 and his CK-MB is 1.4 at this time.  EKG  shows a rate of 153 and atrial flutter.  His axis is 65.  He has a right  bundle branch block which is old.   ASSESSMENT/PLAN:  This is a 77 year old white male with a history of VT  (RVOT?) who presents with new onset atrial flutter.  I will rate control  him overnight with a diltiazem drip and anticoagulate with heparin.  He  will be either DC cardioverted in the morning or be offered an ablation  per the attending's discretion.  TSH will be checked.      Unice Cobble, MD  Electronically Signed     ACJ/MEDQ  D:  03/06/2008  T:  03/07/2008  Job:  [id number]

## 2010-06-18 NOTE — Assessment & Plan Note (Signed)
[facility]                         ELECTROPHYSIOLOGY OFFICE NOTE   NAME:TEAGUEBrydon, Spahr                        MRN:          [id number]  DATE:12/10/2006                            DOB:          Jun 25, 1928    Mr. Moncrief is doing quite well.  He continues to have some palpitations.  These typically last only a number of seconds.  He has had documented  sustained ventricular tachycardia which was presumed to come from the  right ventricular outflow tract and this has been largely quiescent.  He  does take some Verapamil p.r.n.   His blood pressure is managed with Diovan.  His blood pressure's at home  are in the 120's.  Today it is quite elevated at 158/88.  His pulse was  77. His lungs were clear.  Heart sounds were regular.  Extremities were  without edema.  The skin was warm and dry.   Electrocardiogram dated today demonstrated sinus rhythm at 79 with  intervals of 0.18/0.13/0.41 and axis was 60 degrees.   IMPRESSION:  1. RVOT ventricular ectopy.  2. White coat hypertension.  3. Right bundle branch block.   Mr. Soeder is really doing very well at this point.  He continues to  exercise and I have encouraged him to do so.  At this point we will plan  to continue his medical therapy and not intervene invasively for his  ventricular ectopy.   As it relates to his hypertension, he will continue to record these data  and follow up with his primary physician.   We will see him again in one years' time.     Duke Salvia, MD, [facility]  Electronically Signed    SCK/MedQ  DD: 12/10/2006  DT: 12/10/2006  Job #: [id number]

## 2010-06-18 NOTE — Consult Note (Signed)
NAMETANOR, Jonathan Orozco NO.:  [id number]   MEDICAL RECORD NO.:  [id number]          PATIENT TYPE:  EMS   LOCATION:  MAJO                         FACILITY:  [facility]   PHYSICIAN:  Adela Ports, MD   DATE OF BIRTH:  [birth year]   DATE OF CONSULTATION:  03/26/2008  DATE OF DISCHARGE:                                 CONSULTATION   PRIMARY CARDIOLOGIST:  Dr. Graciela Husbands.   CHIEF COMPLAINT:  Tachycardia.   HISTORY OF PRESENT ILLNESS:  [name] is a 77 year old gentleman with  a history of supraventricular tachycardia and recent hospitalization at  the beginning of February for the same.  He reports that he has had  episodes of tachycardia going back at least 15 years.  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  He has no history of known  coronary artery disease.  He has no history of syncope or presyncope.   [name] was discharge from the hospital on March 07, 2008.  During  that hospitalization, had diagnosis of atrial flutter predominantly with  some atrial fibrillation.  He converted to sinus rhythm on Diltiazem and  was discharged on Diltiazem and Coumadin.  After his discharge he  followed up on the 10th with Dr. Graciela Husbands who reviewed his EKGs and thought  that perhaps he had an AVRT or AVNRT given the presence of retrograde P-  waves.  Diltiazem was stopped as was Coumadin.  The patient was placed  on a continuous heart monitor for a planned 3 week period.   After that visit on the 10th he was feeling well until today.  In the  afternoon today he felt a vague fluttery feeling that reminded him of  previous episodes of tachycardia.  He denies any chest pain, shortness  of breath, presyncope, syncope or other symptoms.  There was a problem  with the event button on his monitor, so his wife called the  company and  they checked and told them that he was tachycardiac.  He was advised to  come the emergency department where he was found to be tachycardic with  rates in the 140s to 160s.  His blood pressure was stable and he felt  generally well.   REVIEW OF SYSTEMS:  As above.  Otherwise negative in detail.  He does  say that he has some anxiety and started on anti-depressive/antianxiety  medication recently, although it is unclear if this was actually related  to his arrhythmia.  This was stopped by Dr. Graciela Husbands on the 10th.  He  denies any TIA or stroke-type symptoms.   PAST MEDICAL HISTORY:  1. Hypertension.  2. Dyslipidemia.  3. History of supraventricular tachycardia as above.  4. Reported history of RB OT tachycardia which has not recurred for      many years as far as the patient knows.  5. Osteoarthritis status post right knee replacement, left knee      arthroscopic  and right rotator cuff repair.   ALLERGIES:  No known drug allergies.  The patient did have some sun  sensitivity with AMIODARONE.   CURRENT MEDICATIONS:  1. Diovan 160 mg p.o. daily.  2. Zocor 40 mg p.o. q.h.s.  3. Aspirin 81 mg p.o. daily.  4. Multivitamin 1 tablet daily.  5. Vitamin C.  6. Calcium plus vitamin D.   SOCIAL HISTORY:  [name] is married and lives with his wife in  [place].  He has a remote tobacco and alcohol use but neither has  been used in more than 20 years.   FAMILY HISTORY:  The patient has a brother and a sister who both had  arrhythmias and a brother has had a prior ablation for some type of  supraventricular tachycardia.   PHYSICAL EXAMINATION:  VITAL SIGNS:  Blood pressure was 128/92, heart  rate 154, respiratory rate 16, oxygen saturation 98% on 2 liters by  nasal cannula.  Temperature is afebrile.  Telemetry reveals  supraventricular tachycardia with a right bundle branch block pattern  identical to the patient's baseline.  GENERAL:  The patient is a well-appearing man  in no apparent distress.  He is alert and oriented x3.  HEENT:  Extraocular movement intact.  Mucous membranes moist.  NECK:  Supple without lymphadenopathy or thyromegaly.  Jugular venous  pressure is not elevated.  Carotids are 2+ bilaterally without bruits.  LUNGS:  Clear to auscultation bilaterally.  CARDIAC:  Tachycardia with a regular rate and rhythm.  No murmurs, rubs  or gallops were appreciated.  GI:  Abdomen is soft, nontender, nondistended.  Positive bowel sounds.  EXTREMITIES:  No clubbing, cyanosis or edema appreciated.  NEUROLOGIC:  Grossly normal.  SKIN:  Grossly normal.  MUSCULOSKELETAL:  Normal tone and bulk.  No obvious deformities other  than scars from prior shoulder and knee surgeries.   CLINICAL DATA:  EKG reveals supraventricular tachycardia, heart rate of  154 beats per minute in a right bundle branch block pattern.  The QRS  duration is 126.  There would appear to be atypical T-waves at  approximately 300 beats per minute consistent with atrial flutter  pattern.  There is 1 brief stretch in the EKG where the rate becomes  irregular and looks more like atrial fibrillation.   LABORATORY DATA:  Potassium 4.1, PTT 29.  PT/INR 1.0.  CBC notable for  white count 9.2, hematocrit 43.8 and platelet count of 229,000.  Cardiac  markers were negative x1 and troponin less than assay, and CK-MB is 1.5.   ASSESSMENT/PLAN:  [name] is a pleasant 77 year old gentleman with  the above medical history.  He returns now with recurrent  supraventricular tachycardia.  This appears to be predominantly atrial  flutter with some atrial fibrillation.  1. Supraventricular tachycardia:  We have administered 6 mg of IV      adenosine and had a resulting decrease in heart rate to      approximately 75 beats per minute showing a typical flutter      pattern.  [name] heart rate then returned to approximately      150 beats per minute.  At this point we will administer Diltiazem       and attempt rate control.  We will also recommend that he      reinitiate warfarin given a CHADS score of 2 with paroxysmal atrial      flutter/fibrillation.  We will recommend that he follow up with Dr.  Graciela Husbands for further consideration of management strategies, including      possible atrial flutter ablation.  Of note, Mr. Delvecchio has been on      amiodarone in the past but had some difficulties.  Given his rapid      heart rate, would be reasonable to check an echocardiogram at some      point to make sure that his LV function is preserved in the setting      of intermittent tachyarrhythmia.  2. Hypertension.  Mr. Subramaniam blood pressure is well controlled      currently.  Will continue on his current medications with the      reinitiation of Diltiazem.  3. Dyslipidemia.  Will recommend that he continue his current dose of      statin and follow up as previously.  4. Anticoagulation.  We will ask Mr. Munday to contact the Coumadin      Clinic at [facility] to reinitiate checks after the weekend.      Adela Ports, MD  Electronically Signed     DWM/MEDQ  D:  03/26/2008  T:  03/26/2008  Job:  [id number]

## 2010-06-18 NOTE — Op Note (Signed)
NAMESAED, HUDLOW NO.:  [id number]   MEDICAL RECORD NO.:  [id number]          PATIENT TYPE:  INP   LOCATION:  4703                         FACILITY:  [facility]   PHYSICIAN:  Hillis Range, MD       DATE OF BIRTH:  [birth year]   DATE OF PROCEDURE:  05/30/2008  DATE OF DISCHARGE:                               OPERATIVE REPORT   PREPROCEDURE DIAGNOSES:  1. Atypical atrial flutter.  2. Paroxysmal atrial fibrillation.   POSTPROCEDURE DIAGNOSES:  1. Atypical atrial flutter.  2. Paroxysmal atrial fibrillation.   PROCEDURES:  1. Comprehensive EP study.  2. Coronary sinus pacing and recording.  3. Mapping of SVT.  4. Arrhythmia induction with pacing.   INTRODUCTION:  Mr. Scheffler is a pleasant 77 year old gentleman with a  history of atrial flutter.  He presented to the emergency department on  March 26, 2008, with an EKG suggestive of atypical atrial flutter.  He also has a history of a wide complex tachycardia remotely, which was  previously felt to be right ventricular outflow tract tachycardia.  He  was previously treated with amiodarone, however, this was discontinued  several years ago, and he has had no further ventricular tachycardia.  He now presents for EP study and possible radiofrequency ablation of  atypical atrial flutter.   DESCRIPTION OF PROCEDURE:  Informed written consent was obtained and the  patient was brought to the Electrophysiology Lab in the fasting state.  He was adequately sedated with intravenous Versed and fentanyl as  outlined in the nursing report.  The patient's right groin was prepped  and draped in the usual sterile fashion by the EP lab staff.  Using a  percutaneous Seldinger technique, two 6-French and one 8-French  hemostasis sheaths were placed in the right common femoral vein.  A 6-  French decapolar Polaris X catheter was introduced through the right  common femoral vein and advanced into the coronary sinus for recording  and pacing from this location.  Two 6-French quadripolar Josephson  catheters were introduced through the right common femoral vein and  advanced into the His bundle and right ventricular apex positions  respectively.  The patient presented to the Electrophysiology Lab in  normal sinus rhythm with a right bundle-branch block, QRS morphology.  His RR interval at baseline measured 989 milliseconds with a PR interval  205 milliseconds, QRS duration 136 milliseconds, and QT interval of 431  milliseconds.  His AH interval measured 142 milliseconds with an HV  interval of 64 milliseconds.  Ventricular pacing was performed, which  revealed VA dissociation when pacing at a cycle length of 700  milliseconds.  Rapid atrial pacing was performed which revealed PR  greater than RR with no tachycardias induced with an AV Wenckebach cycle  length of 420 milliseconds.  Atrial extra stimulus testing was  performed, which revealed midline decremental AV conduction with no  clear AH jumps or echo beats.  No tachycardias were induced.  The AV  nodal ERP was 600/270 milliseconds.  Rapid atrial pacing was again  performed and atrial fibrillation was induced when pacing  at a cycle  length of 230 milliseconds.  Atrial fibrillation spontaneously  terminated.  Rapid atrial pacing was again performed down to a cycle  length of 250 milliseconds, and the patient developed atrial flutter.  The atrial flutter activation sequence was variable.  The tachycardia  cycle length ranged between 194 milliseconds and 236 milliseconds.  The  atrial activation sequence recorded along the coronary sinus catheter  varied frequently and the tachycardia was felt to be too unstable for  adequate mapping.  Frequently, the distal coronary sinus atrial  activation was earlier suggestive of left atrial flutter, however, the  coronary sinus activation sequence and tachycardia cycle length were  quite variable.  The patient occasionally  degenerated into atrial  fibrillation and then went organized into atypical atrial flutter again.  The patient did not appear to have atypical or right atrial predominance  of his atrial flutter.  Atrial flutter spontaneously terminated.  Additional rapid atrial pacing was performed and atypical atrial flutter  could be easily and reproducibly induced, however, the tachycardia cycle  length and atrial activation sequence continued to vary significantly.  Because the tachycardia was not suitable for three-dimensional mapping,  an activation map was not performed within the right or left atria.  It  was felt that the tachycardias were multiple and not suitable for  ablation.  No ablation was therefore performed.  The patient  spontaneously terminated tachycardia and returned to sinus rhythm.  There were no early apparent complications.  The procedure was therefore  considered completed.  All catheters were removed and the sheaths were  aspirated out and flushed.  The sheaths were removed and hemostasis was  assured.   CONCLUSIONS:  1. Sinus rhythm upon presentation with a right bundle-branch block and      a mildly prolonged HV interval.  The patient's QT interval was      normal.  2. No evidence of accessory pathways.  The patient had PR greater than      RR, but no clear AH jumps and no inducible AV nodal reentrant      tachycardia.  3. The patient had inducible atypical atrial flutter of multiple cycle      lengths and morphologies.  This was felt to represent multiple      flutter circuits primarily within the left atrium as the left      atrial activation sequence appeared earliest on atrial mapping.      The patient also had intermittent atrial fibrillation.  Because of      the multiple atrial flutter circuits, the patient's rhythm was felt      to not be a candidate for ablation.  He did not have typical      isthmus-dependent atrial flutter during the study, and therefore I       did not elect to perform cavotricuspid isthmus ablation.  The      patient's atrial flutter terminated spontaneously and did not      require cardioversion.  4. No early apparent complications.      Hillis Range, MD  Electronically Signed     JA/MEDQ  D:  05/30/2008  T:  05/31/2008  Job:  [id number]   cc:   Duke Salvia, MD, [facility]

## 2010-06-18 NOTE — Discharge Summary (Signed)
NAMEROBERTSON, COLCLOUGH NO.:  [id number]   MEDICAL RECORD NO.:  [id number]          PATIENT TYPE:  INP   LOCATION:  4703                         FACILITY:  [facility]   PHYSICIAN:  Hillis Range, MD       DATE OF BIRTH:  1928-04-19   DATE OF ADMISSION:  05/30/2008  DATE OF DISCHARGE:  05/31/2008                               DISCHARGE SUMMARY   This patient has allergies to ACE-I INHIBITORS, also to AMIODARONE,  which cause photosensitivity.   PRIMARY CARE GIVER:  Corwin Levins, MD   TIME FOR THIS DICTATION:  Greater than 35 minutes.   FINAL DIAGNOSES:  1. Atypical atrial flutter   SECONDARY DIAGNOSES:  1. Dyslipidemia.  2. Hypertension.  3. Two-D echocardiogram, April 05, 2008, ejection fraction 65%.  No      left ventricular wall motion abnormalities, mild mitral      regurgitation.  4. Benign prostatic hypertrophy.  5. Allergic rhinitis.  6. History of melanoma.  7. Osteoarthritis.   PROCEDURE:  May 30, 2008, electrophysiology study with finding of  multiple atrial flutters.  They are atypical atrial flutters from the  left atrium, too unstable for mapping.  Tachycardia, therefore not  ablated.   BRIEF HISTORY:  Jonathan Orozco is a 77 year old male.  He has a history of  supraventricular tachycardia.  He presented to the emergency room in  February.  Electrocardiogram shows possible atypical atrial flutter.  The patient also had a wide-complex tachycardia secondary to right  ventricular outflow tachycardia.   He is referred to consider catheter ablation of the atrial flutters.  The patient had does not have symptomatic arrhythmias.  He is compliant  with his medical therapies, which include diltiazem and Coumadin.  Medical therapy versus catheter-based intervention have been discussed  with the patient.  The risks, benefits, and alternatives for catheter-  based study were discussed with the patient and he wishes to proceed.   HOSPITAL COURSE:  The  patient presents electively on April 27.  He  underwent electrophysiology study with mapping of SVT.  Atrial flutters  were induced.  They had variable cycle length.  There were multiple  atrial flutters from the left atrium with frequent oscillation from one  flutter to the next.  These were too unstable for mapping.  Tachycardia  ablation was therefore not attempted.  The patient will continue on his  Cardizem at discharge.   MEDICATIONS AT DISCHARGE:  1. A new dose of Cardizem.  He is to take 180 mg twice daily until he      runs out of his home dose and then to switch to Cardizem 360 mg      daily.  2. Multivitamin daily.  3. Calcium 600 mg daily.  4. Vitamin C 500 mg daily.  5. Coumadin as he has been taking before this procedure.  6. Diovan 80 mg daily.  7. Simvastatin 40 mg daily at bedtime.   He follows up at [facility], [address], [address].  See Dr.  Johney Frame, Monday, May 10 at 9:30.   Lab studies  pertinent to this admission were drawn on April 20.  Sodium  140, potassium 4.5, chloride 104, carbonate 31, glucose 122, BUN is 13,  creatinine 0.9.  White cells 6, hemoglobin is 15, hematocrit 44.9,  platelets 215.  Protime 26.0, INR 2.5.      Jonathan Orozco, [place]      Hillis Range, MD  Electronically Signed    GM/MEDQ  D:  05/31/2008  T:  05/31/2008  Job:  [id number]   cc:   Corwin Levins, MD

## 2010-06-18 NOTE — Consult Note (Signed)
NAMEANUP, BRIGHAM NO.:  [id number]   MEDICAL RECORD NO.:  [id number]          PATIENT TYPE:  INP   LOCATION:  2915                         FACILITY:  [facility]   PHYSICIAN:  Hillis Range, MD       DATE OF BIRTH:  01-17-1929   DATE OF CONSULTATION:  DATE OF DISCHARGE:                                 CONSULTATION   REASON FOR CONSULTATION:  Atrial arrhythmias.   HISTORY OF PRESENT ILLNESS:  Mr. Seth is a pleasant 77 year old  gentleman with a history of hypertension and prior right ventricular  outflow tract tachycardia, who was admitted on February 04, 2008, with  sustained atrial arrhythmias.  The patient reports having a remote  history of right ventricular outflow tract tachycardia though he reports  that these symptoms have basically resolved.  He was previously treated  with amiodarone, but this was discontinued more than 5 years ago.  He  denies any further symptomatic ventricular tachycardia since that time.  He is chronically followed for this by Dr. Sherryl Manges.  He denies any  significant palpitations until approximately 6 months ago.  He did have  a single episode of a rapid heart rate, lasting several hours, which  spontaneously resolved, which he feels is very different than his prior  ventricular tachycardia.  His most recent episode occurred while at rest  last evening at his home.  He reports abrupt onset of regular  palpitations with associated nervousness and fatigue.  He therefore  presented to [facility] and was documented to have atrial  flutter with heart rates in the 150s.  He did not have chest pain,  shortness of breath, diaphoresis, presyncope, or syncope.  He was  initiated on a Cardizem drip and intravenous heparin and admitted for  further care.  He was spontaneously converted to sinus rhythm overnight.  He has had frequent premature atrial contractions, as well as in both  nonsustained and sustained atrial fibrillation  documented.  She is  relatively asymptomatic with his atrial fibrillation, however, appears  to be more symptomatic when in atrial flutter with rapid ventricular  rates.  He denies any prior neurologic sequelae and is otherwise without  complaint.  He is unaware of any triggers or precipitants for his  arrhythmias.   PAST MEDICAL HISTORY:  1. Hypertension.  2. Hyperlipidemia.  3. Prior right ventricular outflow tract tachycardia, which has mostly      resolved.  4. Degenerative joint disease.  5. Status post right knee replacement.  6. Status post left knee arthroscopy.  7. Status post right rotator cuff repair.   ALLERGIES:  No known drug allergies, though the patient does report sun  intolerance to AMIODARONE previously.   MEDICATIONS:  1. Simvastatin 40 mg daily.  2. Valsartan 160 mg daily.  3. Multivitamin daily.  4. Aspirin 81 mg daily.   SOCIAL HISTORY:  The patient lives with his spouse in [place].  He  has a remote history of tobacco and alcohol use but has used neither in  20 years.   FAMILY HISTORY:  The patient's brother  and sister have arrhythmias, his  brother has had a prior ablation.   REVIEW OF SYSTEMS:  All systems were reviewed and negative except as  outlined in the HPI above.   PHYSICAL EXAMINATION:  VITAL SIGNS:  Blood pressure 133/79, heart rate  68, respirations 14, sats 100%, afebrile.  Telemetry reveals sinus  rhythm presently; however, he has had atrial flutter, as well as atrial  fibrillation documented.  GENERAL:  The patient is a well-appearing male, in no acute distress.  He is alert and oriented x3.  HEENT:  Normocephalic and atraumatic.  Sclerae clear.  Conjunctivae  pink.  Oropharynx clear.  NECK:  Supple.  No thyromegaly, JVD, or bruits.  LUNGS:  Clear to auscultation bilaterally.  HEART:  Regular rate and rhythm.  No murmurs, rubs, or gallops.  GASTROINTESTINAL:  Soft, nontender, nondistended.  Positive bowel  sounds.  EXTREMITIES:   No clubbing, cyanosis, or edema.  NEUROLOGIC:  His strength and sensation are intact.  SKIN:  No ecchymosis or lacerations.  MUSCULOSKELETAL:  No deformity or atrophy.  PSYCH:  Euthymic mood.  Full affect.   EKG presently reveals sinus rhythm with a first-degree AV block and  right bundle-branch block with nonspecific ST/T-wave changes.  An EKG  from March 06, 2008, reveals atrial flutter with an average  ventricular rate of 150 beats per minute.  This tachycardia is not  classic-appearing atrial flutter, however.   LABORATORY DATA:  Cardiac markers negative.  TSH 0.788.  Potassium 4.2,  BUN 7, creatinine 0.91.  Hematocrit 41.7, platelets 237.  INR 1.   IMPRESSION:  Mr. Ottaviano is a pleasant 77 year old gentleman with a  history of hypertension and prior right ventricular outflow tract  tachycardia, which is largely resolved, who now presents with  symptomatic atrial flutter with rapid ventricular rates.  He is also  observed to have both sustained and nonsustained atrial fibrillation, as  well as very frequent premature atrial contractions during sinus rhythm.  Since converting to sinus rhythm, he has done very well.  He has had  only 1 similar episode of symptomatic tachycardia over the past 6  months.  Therapeutic strategies for atrial arrhythmias including both  medicine and catheter-based therapies were discussed in detail with the  patient today.  He does not have classic-appearing atrial flutter by  EKG, but he could certainly have isthmus depend right atrial flutter.  Nevertheless, given that he has frequent PACs and atrial fibrillation, I  think that our strategy should began with medical therapies.  Should he  exhaust medical therapies, then we could consider catheter ablation down  the road.  I have offered an antiarrhythmic medication; however, the  patient declines antiarrhythmics at this time.  He noted difficulties  with amiodarone in the past.  He does adopt a  strategy of rate control  and anticoagulation with Coumadin.  He is, therefore, placed on Coumadin  with plans to have an INR checked with in 1 week.  I have also placed  him on diltiazem CD 180 mg daily.  This may suppress the patient's PACs,  which are likely his trigger to atrial fibrillation and atrial flutter  and hopefully serve as a rhythm controlling agent in the short term.  My  concern, however, is that he will have increasing symptomatic atrial  arrhythmias down the road.  He should follow up with Dr. Sherryl Manges in  1 month for further evaluation.  We will obtain an echocardiogram at  that time.   PLAN:  1. Coumadin to maintain an INR between 2 and 3 with close outpatient      INR followup.  2. Diltiazem CD 180 mg daily.  3. The patient will follow up with Dr. Sherryl Manges in 1 month.  We      will obtain a transthoracic echocardiogram at that time.      Hillis Range, MD  Electronically Signed     JA/MEDQ  D:  03/07/2008  T:  03/08/2008  Job:  [id number]   cc:   Madolyn Frieze. Jens Som, MD, [place]

## 2010-06-18 NOTE — Letter (Signed)
April 05, 2008    Hillis Range, MD  [address]  [place] [address]   RE:  Orozco, Jonathan  MRN:  [id number]  /  DOB:  [birth year]   Dear Jonathan Orozco,   I was hoping that I could get your insight in regards to Jonathan Orozco.  He is a gentleman who has had problems with recurrent  supraventricular tachycardia that on presentation to hospital a couple  of weeks ago when subjected to adenosine was demonstrated to be an  atrial flutter.  I have not yet had a chance to review the stress, but  they are described by the on-call fellow has been consistent with  atypical atrial flutter.  That may well be the case, I have only been  able to identify them as being long RP with a relatively short PR  interval which suggest the presence of a hidden flutter waves.  He also  had a history of right ventricular outflow tract ectopy and this is  quite symptomatic also, but the issue on the table is his tachycardia.   The question for you is whether you think with your expertise in  mapping, whether you think we could approach, whether we could identify  his substrate for his flutter and hopefully ablate it.  He would like to  pursue that.   MEDICATIONS:  His current medications include,  1. Diovan 8.  2. Cardizem 180.  3. Warfarin.   PHYSICAL EXAMINATION:  VITAL SIGNS:  Today, his blood pressure was  modestly elevated, but improved at 142/60, his pulse was 67, his weight  was 189 which is stable.  LUNGS:  Clear.  CARDIAC:  His heart sounds were regular.  NECK:  Veins were flat.  ABDOMEN:  Soft with active bowel sounds.  EXTREMITIES:  Without edema.   Electrocardiogram dated from February 21 demonstrates his tachycardia is  noted as long RP with a PR interval of about 0.14.  Interestingly in  sinus rhythm his PR interval was about 0.18, again suggestive of an  underlying flutter mechanism.  The fact that he had a PVC and some  irregularity was a little bit surprising to me in that  the rate never  settled back down, but I suspect that must have been because of some  variability in antegrade conduction.   I look forward to hearing from you about Jonathan Orozco and what it is that  you think  we might be able to help him with.    Sincerely,      Duke Salvia, MD, [facility]  Electronically Signed    SCK/MedQ  DD: 04/05/2008  DT: 04/06/2008  Job #: [id number]

## 2010-06-18 NOTE — Assessment & Plan Note (Signed)
[facility]                         ELECTROPHYSIOLOGY OFFICE NOTE   Jonathan, Orozco                        MRN:          [id number]  DATE:03/15/2008                            DOB:          04/11/1928    Jonathan Orozco is seen in followup for hospitalization where he presented  with a wide complex tachycardia at a heart rate of 150.  It terminated  following the initiation of Cardizem with a short run of atrial  fibrillation just prior to restoration of sinus rhythm.  Review of  electrocardiogram suggest the presence of a retrograde P-wave in the  inferior leads.   This episode was associated with a sense of nervousness.  He has been  wearing a monitor at home for the intervening 7-10 days and  his monitor  keeps telling him that he is having heart rates in the 150-200 range,  although these have been without any associated symptoms.   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  These were at very rapid rates.   The patient also had a change in his exercise capacity over recent  months.  This has been unassociated with chest discomfort.   His current medications include a recently added Prozac which I have  asked him to hold, Cardizem which was started at hospital 180.  He also  takes Diovan 80 and warfarin.   PHYSICAL EXAMINATION:  His blood pressure stunk - it was 160/82, his  weight was 190 which was stable, his pulse was 67.  His neck veins were flat.  His heart sounds were regular.  His carotids were brisk.  LUNGS:  Clear.  EXTREMITIES:  Without edema.   Electrocardiogram dated today demonstrated sinus rhythm at 67 with  interval of 0.19/0.14/0.45 with right bundle-branch block.   IMPRESSION:  1. Hypertension, question white coat.  2. Tachycardia with possible retrograde P-waves suggestive of      atrioventricular reentrant tachycardia a question mechanism.  3. History of  repetitive monomorphic ventricular tachycardia for      recurrent episodes of tachycardias identified by his monitor      unassociated with symptoms with rates between 115-200, question      mechanism.   I am going to use a real time event recorder to try to clarify the  mechanism underlying Mr. Brines's tachycardia that is identified by his  handheld monitor.  Also given the nervousness it is important to  clarify whether that is an arrhythmic mechanism or a psychiatric issue  prior to the initiation of Prozac therapy.   I am also concerned about his blood pressure.  He says it is white coat  hypertension.  We will have to figure out a way to get some blood  pressure recordings from home potentially with ambulatory monitor to see  as his systolic blood pressures are quite strikingly elevated.     Duke Salvia, MD, [facility]  Electronically Signed    SCK/MedQ  DD: 03/15/2008  DT: 03/16/2008  Job #: [id number]

## 2010-06-20 ENCOUNTER — Ambulatory Visit (INDEPENDENT_AMBULATORY_CARE_PROVIDER_SITE_OTHER): Payer: 59 | Admitting: *Deleted

## 2010-06-20 DIAGNOSIS — I4892 Unspecified atrial flutter: Secondary | ICD-10-CM

## 2010-06-20 LAB — POCT INR: INR: 2.7

## 2010-06-21 NOTE — Op Note (Signed)
   NAME:  Jonathan Orozco, Jonathan Orozco NO.:  [id number]   MEDICAL RECORD NO.:  [id number]                   PATIENT TYPE:  AMB   LOCATION:  ENDO                                 FACILITY:  [facility]   PHYSICIAN:  Georgiana Spinner, M.D.                 DATE OF BIRTH:  04/08/1928   DATE OF PROCEDURE:  06/23/2002  DATE OF DISCHARGE:                                 OPERATIVE REPORT   PROCEDURE:  Colonoscopy.   INDICATIONS:  Colon cancer screening.   ANESTHESIA:  Demerol 100, Versed 10 mg.   PROCEDURE:  With the patient mildly sedated in the left lateral decubitus  position, the Olympus videoscopic colonoscope was inserted in the rectum and  passed under direct vision with pressure applied to the abdomen.  The  patient was then rolled to his back to the cecum, identified by the  ileocecal valve and appendiceal orifice, both of which were photographed.  From this point the colonoscope was slowly withdrawn, taking circumferential  views of the entire colonic mucosa, stopping only in the rectum, which  appeared normal on direct and retroflex view.  The endoscope was  straightened and withdrawn.  The patient's vital signs and pulse oximetry  remained stable.  The patient tolerated the procedure well without apparent  complications.   FINDINGS:  Unremarkable examination.   PLAN:  Repeat examination in 5 to 10 years.                                               Georgiana Spinner, M.D.    GMO/MEDQ  D:  06/23/2002  T:  06/23/2002  Job:  [id number]

## 2010-06-21 NOTE — Assessment & Plan Note (Signed)
[facility]                           ELECTROPHYSIOLOGY OFFICE NOTE   NAME:TEAGUEDontrae, Morini                        MRN:          [id number]  DATE:12/30/2005                            DOB:          [birth year]    Mr. Swamy is seen.  He has a history of RVOT-VT.  I last saw him about a  year and a half ago.  He comes in today, actually doing pretty well.  He  takes verapamil 180 mg a day, and this has done much better than his  Diltiazem.   He is exercising a little less in vigorously and thinks that is what is  making the difference.   His other medications include Diovan 160 and Lovastatin.   PHYSICAL EXAMINATION:  VITAL SIGNS:  I have misplaced.  His pulse was about  75.  HEART:  His heart sounds were regular.  EXTREMITIES:  Without edema.   IMPRESSION:  1. Right ventricular outflow tract ventricular tachycardia.  2. Hypertension.   Mr. Pickup is stable.  We have renewed his prescriptions and will see him  again in one year's time.     Duke Salvia, MD, [facility]  Electronically Signed    SCK/MedQ  DD: 12/30/2005  DT: 12/30/2005  Job #: [id number]

## 2010-06-21 NOTE — Op Note (Signed)
NAMEYANDRIEL, BOENING NO.:  [id number]   MEDICAL RECORD NO.:  [id number]          PATIENT TYPE:  INP   LOCATION:  2899                         FACILITY:  [facility]   PHYSICIAN:  Mark C. Ophelia Charter, M.D.    DATE OF BIRTH:  October 27, 1928   DATE OF PROCEDURE:  07/10/2004  DATE OF DISCHARGE:                                 OPERATIVE REPORT   PREOPERATIVE DIAGNOSIS:  Right knee osteoarthritis.   POSTOPERATIVE DIAGNOSIS:  Right knee osteoarthritis.   OPERATION/PROCEDURE:  Right total knee arthroplasty.   SURGEON:  Mark C. Ophelia Charter, M.D.   ANESTHESIA:  General plus preoperative femoral block and postoperative  local, 30 mL.   TOURNIQUET TIME:  One hour and 29 minutes x 350 torque.   COMPONENTS USED:  DePuy Patellofemoral compartment, sigma cruciate  substituting femoral component with lugs, #4 femur, #4 tibia, 10 mm spacer,  41 mm patella.   DESCRIPTION OF PROCEDURE:  After induction of preoperative Ancef prophylaxis  and prepping and draping, using a proximal thigh tourniquet at 350, leg was  wrapped with an Esmarch, tourniquet inflated.  The usual sterile skin  marker, Betadine prep was used.  Medial parapatellar tendon incision was  made.  We made an arthrotomy, splitting the quad tendon between the middle  third and lateral two-thirds.  Patella was flipped over and cut removing 9  mm of bone.  Intramedullary line was used for cutting both the tibia and the  femur.  The femur was cut first followed by the tibia ___A-P_______ cut and  then the chamfer and box cuts on the femur, sized for a #4.  Posterior large  osteophytes removed on the femur.  A 0-degree slope on the tibia, 5-degree  right on the femur.  Patella size 41 mm then prepared.  Keel slots were made  in the tibia, knee in full extension and full flexion.  The posterior  capsule was stripped slightly with removal of the posterior osteophytes off  the back of the femur.  Trials were inserted.  There was full  extension with  trace hyperextension, symmetrical flexion and extension balance.  After  pulsatile irrigation, cement was vacuum mixed and implanted, tibia first,  then femur followed by poly insert and patella.  Instrument count, needle  count were correct.  The patient was transferred to the recovery room in  stable condition.  Tourniquet was deflated.  Hemostasis was obtained.  Deep  fascia was closed with #1 Ticron and 2-0 Vicryl for the subcutaneous tissue.  Skin with staple closure.  Marcaine infiltration.  Postoperative dressing  and knee immobilizer.       MCY/MEDQ  D:  07/10/2004  T:  07/11/2004  Job:  [id number]

## 2010-06-21 NOTE — Discharge Summary (Signed)
[name], Orozco NO.:  [id number]   MEDICAL RECORD NO.:  [id number]          PATIENT TYPE:  INP   LOCATION:  5024                         FACILITY:  [facility]   PHYSICIAN:  Mark C. Ophelia Charter, M.D.    DATE OF BIRTH:  10-Jun-1928   DATE OF ADMISSION:  07/10/2004  DATE OF DISCHARGE:  07/13/2004                                 DISCHARGE SUMMARY   FINAL DIAGNOSIS:  Right knee osteoarthritis.   PROCEDURE:  Right total knee arthroplasty.   ADDITIONAL DIAGNOSIS:  Hypertension.   HISTORY OF PRESENT ILLNESS:  This is a 77 year old male with progressive  right knee osteoarthritis with marginal osteophytes, joint space collapse,  subchondral sclerosis and failed conservative treatment.   ADMISSION MEDICATIONS:  1.  Diovan 160 mg.  2.  Calcium 600 mg.  3.  Glucosamine.  4.  Vitamin C.  5.  Aspirin one a day.  6.  Multivitamins.   ADMISSION LABORATORY DATA:  Admission labs included a hemoglobin of 15.  Postoperatively, he was in the 10-11 range.  PT and PTT were normal.  Chemistry panel initially was normal; postoperatively, his glucose was  elevated at 190.  There were small leukocyte esterase present with no  bacteria on his urinalysis.   HOSPITAL COURSE:  The patient was admitted and underwent right total knee  arthroplasty, cemented, under general anesthesia, tourniquet time -- an hour  and 30 minutes.  Postoperatively, he was seen by PT, OT, Pharmacy for  Coumadin prophylaxis, CPM machine.  He ran a low-grade fever, used Tri-Flow  with resolution.  The incision looked good.  No rehab consult was needed due  to progressive ambulation achievement that the patient made.  Incision  looked good, neurologic exam was normal, he was taking 5 mg daily of  Coumadin and was discharged on the 10th in satisfactory with office followup  1 week post discharge.  Condition on discharge was improved.  X-rays showed  good position of the prosthesis.      Mark C. Ophelia Charter, M.D.  Electronically Signed     MCY/MEDQ  D:  10/02/2004  T:  10/03/2004  Job:  [id number]

## 2010-07-18 ENCOUNTER — Ambulatory Visit (INDEPENDENT_AMBULATORY_CARE_PROVIDER_SITE_OTHER): Payer: Medicare Other | Admitting: *Deleted

## 2010-07-18 DIAGNOSIS — I4892 Unspecified atrial flutter: Secondary | ICD-10-CM

## 2010-07-18 LAB — POCT INR: INR: 2.6

## 2010-08-15 ENCOUNTER — Ambulatory Visit (INDEPENDENT_AMBULATORY_CARE_PROVIDER_SITE_OTHER): Payer: Medicare Other | Admitting: *Deleted

## 2010-08-15 DIAGNOSIS — I4892 Unspecified atrial flutter: Secondary | ICD-10-CM

## 2010-09-10 ENCOUNTER — Other Ambulatory Visit: Payer: Self-pay | Admitting: Internal Medicine

## 2010-09-10 ENCOUNTER — Other Ambulatory Visit: Payer: 59

## 2010-09-10 DIAGNOSIS — Z79899 Other long term (current) drug therapy: Secondary | ICD-10-CM

## 2010-09-12 ENCOUNTER — Ambulatory Visit (INDEPENDENT_AMBULATORY_CARE_PROVIDER_SITE_OTHER): Payer: Medicare Other | Admitting: *Deleted

## 2010-09-12 DIAGNOSIS — I4892 Unspecified atrial flutter: Secondary | ICD-10-CM

## 2010-09-17 ENCOUNTER — Ambulatory Visit: Payer: 59 | Admitting: Internal Medicine

## 2010-10-10 ENCOUNTER — Ambulatory Visit (INDEPENDENT_AMBULATORY_CARE_PROVIDER_SITE_OTHER): Payer: Medicare Other | Admitting: *Deleted

## 2010-10-10 DIAGNOSIS — I4892 Unspecified atrial flutter: Secondary | ICD-10-CM

## 2010-10-14 ENCOUNTER — Other Ambulatory Visit: Payer: Self-pay | Admitting: Internal Medicine

## 2010-10-14 MED ORDER — WARFARIN SODIUM 5 MG PO TABS: ORAL_TABLET | ORAL | Status: DC

## 2010-11-07 ENCOUNTER — Ambulatory Visit (INDEPENDENT_AMBULATORY_CARE_PROVIDER_SITE_OTHER): Payer: Medicare Other | Admitting: *Deleted

## 2010-11-07 DIAGNOSIS — I4892 Unspecified atrial flutter: Secondary | ICD-10-CM

## 2010-11-11 ENCOUNTER — Ambulatory Visit (INDEPENDENT_AMBULATORY_CARE_PROVIDER_SITE_OTHER): Payer: Medicare Other | Admitting: Internal Medicine

## 2010-11-11 ENCOUNTER — Encounter: Payer: Self-pay | Admitting: Internal Medicine

## 2010-11-11 VITALS — BP 122/88 | HR 69 | Temp 97.5°F | Ht 72.0 in | Wt 187.5 lb

## 2010-11-11 DIAGNOSIS — H9190 Unspecified hearing loss, unspecified ear: Secondary | ICD-10-CM

## 2010-11-11 DIAGNOSIS — I10 Essential (primary) hypertension: Secondary | ICD-10-CM

## 2010-11-11 DIAGNOSIS — M545 Low back pain, unspecified: Secondary | ICD-10-CM | POA: Insufficient documentation

## 2010-11-11 DIAGNOSIS — R7302 Impaired glucose tolerance (oral): Secondary | ICD-10-CM

## 2010-11-11 DIAGNOSIS — R7309 Other abnormal glucose: Secondary | ICD-10-CM

## 2010-11-11 DIAGNOSIS — G8929 Other chronic pain: Secondary | ICD-10-CM

## 2010-11-11 DIAGNOSIS — Z23 Encounter for immunization: Secondary | ICD-10-CM

## 2010-11-11 DIAGNOSIS — H9193 Unspecified hearing loss, bilateral: Secondary | ICD-10-CM | POA: Insufficient documentation

## 2010-11-11 MED ORDER — HYDROCODONE-ACETAMINOPHEN 5-325 MG PO TABS: 1.0000 | ORAL_TABLET | Freq: Four times a day (QID) | ORAL | Status: DC | PRN

## 2010-11-11 NOTE — Assessment & Plan Note (Signed)
stable overall by hx and exam, and pt to continue medical treatment as before - pain med refill done

## 2010-11-11 NOTE — Assessment & Plan Note (Signed)
Improved after bilat wax impaction irrigation, to f/u audiology prn

## 2010-11-11 NOTE — Assessment & Plan Note (Signed)
stable overall by hx and exam, most recent data reviewed with pt, and pt to continue medical treatment as before  BP Readings from Last 3 Encounters:  11/11/10 122/88  05/24/10 126/78  03/19/10 122/72

## 2010-11-11 NOTE — Assessment & Plan Note (Signed)
stable overall by hx and exam, most recent data reviewed with pt, and pt to continue medical treatment as before;le  Lab Results  Component Value Date   WBC 6.6 03/19/2010   HGB 14.6 03/19/2010   HCT 43.1 03/19/2010   PLT 230.0 03/19/2010   GLUCOSE 88 03/19/2010   CHOL 131 03/19/2010   TRIG 60.0 03/19/2010   HDL 45.70 03/19/2010   LDLDIRECT 159.2 09/29/2006   LDLCALC 73 03/19/2010   ALT 21 03/19/2010   AST 23 03/19/2010   NA 139 03/19/2010   K 4.5 03/19/2010   CL 105 03/19/2010   CREATININE 0.9 03/19/2010   BUN 12 03/19/2010   CO2 30 03/19/2010   TSH 0.71 03/19/2010   PSA 1.96 03/16/2009   INR 2.1 11/07/2010

## 2010-11-11 NOTE — Patient Instructions (Addendum)
You had the flu shot today Your ears were irrigated today of wax Please followup for the hearing evaluation Continue all other medications as before, including the hydrocodone

## 2010-11-11 NOTE — Progress Notes (Signed)
Subjective:    Patient ID: KHIREE BUKHARI, male    DOB: 17-Mar-1928, 77 y.o.   MRN: [id number]  HPI Here to f/u after went to audiologist, informed he needed cerumen impactions removed, then return for hearing eval.  Here to f/u; overall doing ok,  Pt denies chest pain, increased sob or doe, wheezing, orthopnea, PND, increased LE swelling, palpitations, dizziness or syncope.  Pt denies new neurological symptoms such as new headache, or facial or extremity weakness or numbness   Pt denies polydipsia, polyuria,  Pt states overall good compliance with meds, trying to follow lower cholesterol diet, wt overall stable but little exercise however. Lamenting his age today "77" but overall no other complaints   Pt denies fever, wt loss, night sweats, loss of appetite, or other constitutional symptoms.  Pt continues to have recurring LBP without change in severity, bowel or bladder change, fever, wt loss,  worsening LE pain/numbness/weakness, gait change or falls.  Overall pain controlled on current meds  Past Medical History  Diagnosis Date  . MELANOMA, MALIGNANT, SKIN NOS 09/29/2006  . HYPERLIPIDEMIA 09/29/2006  . ERECTILE DYSFUNCTION 09/29/2006  . 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 ABUSE, ALCOHOL, IN REMISSION 04/08/2007  . Other specified forms of hearing loss 08/10/2009  . HYPERTENSION 09/29/2006  . VENTRICULAR TACHYCARDIA 09/29/2006  . SINUSITIS- ACUTE-NOS 06/23/2007  . ALLERGIC RHINITIS 09/29/2006  . BENIGN PROSTATIC HYPERTROPHY 09/29/2006  . OSTEOARTHROSIS NOS, OTHER [facility] SITE 09/29/2006  . BACK PAIN 09/12/2008  . LEG PAIN, RIGHT 10/02/2008  . VERTIGO 04/08/2007  . COLONIC POLYPS, HX OF 06/23/2007  . CHEST PAIN 07/04/2008  . FATIGUE 11/16/2009  . VIRAL GASTROENTERITIS 01/29/2010  . GLUCOSE INTOLERANCE 03/19/2010  . Atrial flutter 03/28/2010  . Encounter for long-term (current) use of anticoagulants 04/26/2010   Past Surgical History  Procedure Date  . Left rotato cuff   . Joint replacement       Right knee  . Tonsillectomy   . Left knee arthroscopy     reports that he has quit smoking. He does not have any smokeless tobacco history on file. He reports that he does not drink alcohol or use illicit drugs. family history includes Diabetes in his brother. Allergies  Allergen Reactions  . Ace Inhibitors     REACTION: cough  . Amiodarone Hcl     REACTION: intolerance   Current Outpatient Prescriptions on File Prior to Visit  Medication Sig Dispense Refill  . atorvastatin (LIPITOR) 10 MG tablet Take 10 mg by mouth daily.        . fish oil-omega-3 fatty acids 1000 MG capsule Take 1 g by mouth daily.        [name] MATZIM LA 360 MG 24 hr tablet TAKE 1 TABLET DAILY  90 tablet  3  . Multiple Vitamin (MULTIVITAMIN) tablet Take 1 tablet by mouth daily.        . Tamsulosin HCl (FLOMAX) 0.4 MG CAPS Take by mouth.        . valsartan (DIOVAN) 80 MG tablet Take 80 mg by mouth daily.        [name] warfarin (COUMADIN) 5 MG tablet Take as directed by Anticoagulation clinic   105 tablet  3  . Ascorbic Acid (VITAMIN C) 500 MG tablet Take 500 mg by mouth daily.         Review of Systems Review of Systems  Constitutional: Negative for diaphoresis and unexpected weight change.  HENT: Negative for drooling and tinnitus.   Eyes: Negative  for photophobia and visual disturbance.  Respiratory: Negative for choking and stridor.   Gastrointestinal: Negative for vomiting and blood in stool.  Genitourinary: Negative for hematuria and decreased urine volume.     Objective:   Physical Exam BP 122/88  Pulse 69  Temp(Src) 97.5 F (36.4 C) (Oral)  Ht 6' (1.829 m)  Wt 187 lb 8 oz (85.049 kg)  BMI 25.43 kg/m2  SpO2 94% Physical Exam  VS noted, not ill appearing Constitutional: Pt appears well-developed and well-nourished.  HENT: Head: Normocephalic.  Right Ear: External ear normal.  Left Ear: External ear normal.  Bilat tm's normal appearance and hearing improved after wax irrigated bilat Eyes:  Conjunctivae and EOM are normal. Pupils are equal, round, and reactive to light.  Neck: Normal range of motion. Neck supple.  Cardiovascular: Normal rate and regular rhythm.   Pulmonary/Chest: Effort normal and breath sounds normal.  Abd:  Soft, NT, non-distended, + BS Neurological: Pt is alert. No cranial nerve deficit. motor 5/5, gait somewhat side based Skin: Skin is warm. No erythema.  Psychiatric: Pt behavior is normal. Thought content normal.     Assessment & Plan:

## 2010-12-05 ENCOUNTER — Ambulatory Visit (INDEPENDENT_AMBULATORY_CARE_PROVIDER_SITE_OTHER): Payer: Medicare Other | Admitting: *Deleted

## 2010-12-05 DIAGNOSIS — I4892 Unspecified atrial flutter: Secondary | ICD-10-CM

## 2010-12-05 DIAGNOSIS — Z7901 Long term (current) use of anticoagulants: Secondary | ICD-10-CM

## 2010-12-05 LAB — POCT INR: INR: 3.4

## 2010-12-08 ENCOUNTER — Emergency Department (HOSPITAL_COMMUNITY)
Admission: EM | Admit: 2010-12-08 | Discharge: 2010-12-08 | Discharge status: Against Medical Advice | Payer: Medicare Other | Attending: Emergency Medicine | Admitting: Emergency Medicine

## 2010-12-08 DIAGNOSIS — W298XXA Contact with other powered powered hand tools and household machinery, initial encounter: Secondary | ICD-10-CM | POA: Insufficient documentation

## 2010-12-08 DIAGNOSIS — S6990XA Unspecified injury of unspecified wrist, hand and finger(s), initial encounter: Secondary | ICD-10-CM | POA: Insufficient documentation

## 2010-12-08 DIAGNOSIS — Y92009 Unspecified place in unspecified non-institutional (private) residence as the place of occurrence of the external cause: Secondary | ICD-10-CM | POA: Insufficient documentation

## 2010-12-08 NOTE — ED Notes (Signed)
2 inch laceration to left post wrist, bleeding controlled

## 2010-12-09 ENCOUNTER — Encounter (HOSPITAL_COMMUNITY): Payer: Self-pay | Admitting: *Deleted

## 2010-12-09 ENCOUNTER — Telehealth: Payer: Self-pay

## 2010-12-09 ENCOUNTER — Emergency Department (HOSPITAL_COMMUNITY)
Admission: EM | Admit: 2010-12-09 | Discharge: 2010-12-09 | Disposition: A | Discharge status: Home / Self Care | Payer: Medicare Other | Attending: Emergency Medicine | Admitting: Emergency Medicine

## 2010-12-09 DIAGNOSIS — X58XXXA Exposure to other specified factors, initial encounter: Secondary | ICD-10-CM | POA: Insufficient documentation

## 2010-12-09 DIAGNOSIS — I10 Essential (primary) hypertension: Secondary | ICD-10-CM | POA: Insufficient documentation

## 2010-12-09 DIAGNOSIS — Z7901 Long term (current) use of anticoagulants: Secondary | ICD-10-CM | POA: Insufficient documentation

## 2010-12-09 DIAGNOSIS — S61509A Unspecified open wound of unspecified wrist, initial encounter: Secondary | ICD-10-CM | POA: Insufficient documentation

## 2010-12-09 DIAGNOSIS — S61512A Laceration without foreign body of left wrist, initial encounter: Secondary | ICD-10-CM

## 2010-12-09 DIAGNOSIS — I4892 Unspecified atrial flutter: Secondary | ICD-10-CM | POA: Insufficient documentation

## 2010-12-09 DIAGNOSIS — Z85820 Personal history of malignant melanoma of skin: Secondary | ICD-10-CM | POA: Insufficient documentation

## 2010-12-09 MED ORDER — LIDOCAINE-EPINEPHRINE 2 %-1:100000 IJ SOLN
30.0000 mL | Freq: Once | INTRAMUSCULAR | Status: AC
  Administered 2010-12-09: 30 mL via INTRADERMAL

## 2010-12-09 NOTE — ED Provider Notes (Signed)
History     CSN: [id number] Arrival date & time: 12/09/2010  8:56 AM   First MD Initiated Contact with Patient 12/09/10 [phone]      Chief Complaint  Patient presents with  . Extremity Laceration    (Consider location/radiation/quality/duration/timing/severity/associated sxs/prior treatment) The history is provided by the patient.   patient complains of laceration to left posterior wrist that occurred yesterday at approximately 2:30 PM.  He began to the emergency department last night however left secondary to a prolonged wait and thus presented to the ER today for followup of his laceration.  He is on Coumadin for history of paroxysmal atrial flutter.  His had no bleeding since.  Denies weakness of his left hand.  He denies tingling of his left hand.  He denies any numbness.  His pain is mild.  Nothing improves his symptoms.  Nothing worsens the symptoms.  Symptoms are constant.  They're sore in nature.  His had no prior treatment  Past Medical History  Diagnosis Date  . MELANOMA, MALIGNANT, SKIN NOS 09/29/2006  . HYPERLIPIDEMIA 09/29/2006  . ERECTILE DYSFUNCTION 09/29/2006  . 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 ABUSE, ALCOHOL, IN REMISSION 04/08/2007  . Other specified forms of hearing loss 08/10/2009  . HYPERTENSION 09/29/2006  . VENTRICULAR TACHYCARDIA 09/29/2006  . SINUSITIS- ACUTE-NOS 06/23/2007  . ALLERGIC RHINITIS 09/29/2006  . BENIGN PROSTATIC HYPERTROPHY 09/29/2006  . OSTEOARTHROSIS NOS, OTHER [facility] SITE 09/29/2006  . BACK PAIN 09/12/2008  . LEG PAIN, RIGHT 10/02/2008  . VERTIGO 04/08/2007  . COLONIC POLYPS, HX OF 06/23/2007  . CHEST PAIN 07/04/2008  . FATIGUE 11/16/2009  . VIRAL GASTROENTERITIS 01/29/2010  . GLUCOSE INTOLERANCE 03/19/2010  . Atrial flutter 03/28/2010  . Encounter for long-term (current) use of anticoagulants 04/26/2010    Past Surgical History  Procedure Date  . Left rotato cuff   . Joint replacement     Right knee  . Tonsillectomy   . Left knee arthroscopy      Family History  Problem Relation Age of Onset  . Diabetes Brother     History  Substance Use Topics  . Smoking status: Former Games developer  . Smokeless tobacco: Not on file   Comment: Quit smoking and drinking 20 years ago  . Alcohol Use: No      Review of Systems  All other systems reviewed and are negative.    Allergies  Ace inhibitors and Amiodarone hcl  Home Medications   Current Outpatient Rx  Name Route Sig Dispense Refill  . ATORVASTATIN CALCIUM 10 MG PO TABS Oral Take 10 mg by mouth daily.      . OMEGA-3 FATTY ACIDS 1000 MG PO CAPS Oral Take 1 g by mouth daily.      [name] HYDROCODONE-ACETAMINOPHEN 5-325 MG PO TABS Oral Take 1 tablet by mouth every 6 (six) hours as needed. 90 tablet 1  . MATZIM LA 360 MG PO TB24  TAKE 1 TABLET DAILY 90 tablet 3  . ONE-DAILY MULTI VITAMINS PO TABS Oral Take 1 tablet by mouth daily.      [name] TAMSULOSIN HCL 0.4 MG PO CAPS Oral Take by mouth.      . VALSARTAN 80 MG PO TABS Oral Take 80 mg by mouth daily.      . WARFARIN SODIUM 5 MG PO TABS  Take as directed by Anticoagulation clinic  105 tablet 3    BP 146/83  Pulse 78  Temp(Src) 98 F (36.7 C) (Oral)  Resp 20  SpO2 98%  Physical  Exam  Constitutional: He is oriented to person, place, and time. He appears well-developed and well-nourished.  HENT:  Head: Normocephalic.  Eyes: EOM are normal.  Neck: Normal range of motion.  Pulmonary/Chest: Effort normal.  Musculoskeletal: Normal range of motion.       The distal posterior wrist with small superficial laceration.  There is no bleeding at this time.  He has normal extensor tendon function.  Normal left radial pulse.  Normal perfusion of his fingers in the left.. wound is noncontaminated.  Complete depth of the wound is noted no foreign bodies are noted and no obvious tendon injury.  Neurological: He is alert and oriented to person, place, and time.  Psychiatric: He has a normal mood and affect.    ED Course  Procedures (including  critical care time) LACERATION REPAIR Performed by: Lyanne Co Consent: Verbal consent obtained. Risks and benefits: risks, benefits and alternatives were discussed Patient identity confirmed: provided demographic data Time out performed prior to procedure Prepped and Draped in normal sterile fashion Wound explored  Laceration Location: left posterior wrist  Laceration Length: 3cm  No Foreign Bodies seen or palpated  Anesthesia: local infiltration  Local anesthetic: lidocaine 2% with epinephrine  Anesthetic total: 4 ml  Irrigation method: syringe Amount of cleaning: standard  Skin closure: loose with 4.0 prolene  Number of sutures or staples: 3  Technique: simple  Patient tolerance: Patient tolerated the procedure well with no immediate complications.  Labs Reviewed - No data to display No results found.   1. Laceration of left wrist       MDM  Laceration repaired.  No bleeding at this time.  No indication to check his INR.  Infection warnings given.  We'll follow up with his primary care Dr. for removal of his sutures.  Instructed to return to the development of redness ,drainage or fever        Lyanne Co, MD 12/09/10 1016

## 2010-12-09 NOTE — ED Notes (Signed)
2 inch laceration on l/hand sutured by EDP. Pt is on coumadin

## 2010-12-09 NOTE — ED Notes (Signed)
Wound care with bacitracin oint completed. Proper daily woufd care reviewed with pt

## 2010-12-09 NOTE — ED Notes (Signed)
Bandaged laceration to L Posterior wrist. Seen last night for same, left post triage due to wait.

## 2010-12-09 NOTE — Telephone Encounter (Signed)
Patients wife called to inform the patient was cut on Sunday December 08, 2010 with a chain saw. They went to the ER yesterday, but left due to the long wait. They wanted to either come in today (12/09/2010)  to see Dr. Jonny Ruiz or go back to the ER. Informed that Dr. Jonny Ruiz was out today and would need to go back to the ER to be appropriately cared for.

## 2010-12-11 ENCOUNTER — Telehealth: Payer: Self-pay | Admitting: Nurse Practitioner

## 2010-12-11 NOTE — Telephone Encounter (Signed)
pts wife called in on Sunday night 11/4 @ 9:24pm stating that pt cut his arm accidentally with a chainsaw earlier in the day.  They had presented to the [facility] ED but left as it was too busy and then went to [facility] ED, where they waited for 4.5 hrs, were eval. By a nurse with the washcloth he was using to stem the flow of blood was changed out for gauze but he was never seen by a physician and left b/c of the long wait.  Pt isn't sure if the wound was ever formally cleaned.  His plan is to go to primary care in the am for suturing.  The wound cont. To ooze through the guaze.  They called our office b/c he is on coumadin and they were wondering if he should take it tonight.  I advised to hold coumadin but more importantly that he should present back to the ED for evaluation of the wound, which by their description is severe, and has yet to be disinfected.  I also pointed out that the window for him to have it sutured is narrowing.  Pts wife acknowledged by instructions but said she didn't think he'd go back to the ER.

## 2010-12-18 ENCOUNTER — Telehealth: Payer: Self-pay

## 2010-12-18 ENCOUNTER — Telehealth: Payer: Self-pay | Admitting: Internal Medicine

## 2010-12-18 ENCOUNTER — Other Ambulatory Visit: Payer: Self-pay

## 2010-12-18 MED ORDER — VALSARTAN 80 MG PO TABS: 80.0000 mg | ORAL_TABLET | Freq: Every day | ORAL | Status: DC

## 2010-12-18 MED ORDER — DILTIAZEM HCL ER COATED BEADS 360 MG PO TB24: 360.0000 mg | ORAL_TABLET | Freq: Every day | ORAL | Status: DC

## 2010-12-18 MED ORDER — ATORVASTATIN CALCIUM 10 MG PO TABS: 10.0000 mg | ORAL_TABLET | Freq: Every day | ORAL | Status: DC

## 2010-12-18 MED ORDER — WARFARIN SODIUM 5 MG PO TABS: ORAL_TABLET | ORAL | Status: DC

## 2010-12-18 NOTE — Telephone Encounter (Signed)
New message:  Pt needs a written pres for Warfarin 5 mg 90 day supply.  Please call when ready for them to pick up.  [phone]

## 2010-12-18 NOTE — Telephone Encounter (Signed)
Patients wife called requesting hardcopy refills on Atorvastatin, Diovan and Cardizem. Call back number when ready for pickup [phone]. Called his wife informed prescriptions requested are ready for pickup.

## 2010-12-19 NOTE — Telephone Encounter (Signed)
A user error has taken place: encounter opened in error, closed for administrative reasons.

## 2010-12-23 ENCOUNTER — Encounter: Payer: Self-pay | Admitting: Internal Medicine

## 2010-12-23 ENCOUNTER — Ambulatory Visit (INDEPENDENT_AMBULATORY_CARE_PROVIDER_SITE_OTHER): Payer: Medicare Other | Admitting: Internal Medicine

## 2010-12-23 VITALS — BP 150/82 | HR 79 | Temp 97.4°F | Ht 72.0 in | Wt 188.1 lb

## 2010-12-23 DIAGNOSIS — J309 Allergic rhinitis, unspecified: Secondary | ICD-10-CM

## 2010-12-23 DIAGNOSIS — S61512A Laceration without foreign body of left wrist, initial encounter: Secondary | ICD-10-CM

## 2010-12-23 DIAGNOSIS — R7309 Other abnormal glucose: Secondary | ICD-10-CM

## 2010-12-23 DIAGNOSIS — R7302 Impaired glucose tolerance (oral): Secondary | ICD-10-CM

## 2010-12-23 DIAGNOSIS — S61509A Unspecified open wound of unspecified wrist, initial encounter: Secondary | ICD-10-CM

## 2010-12-23 DIAGNOSIS — I10 Essential (primary) hypertension: Secondary | ICD-10-CM

## 2010-12-23 NOTE — Patient Instructions (Signed)
Your stiches were removed today Continue all other medications as before Remember, you can use OTC allegra for allergies if needed

## 2010-12-24 ENCOUNTER — Telehealth: Payer: Self-pay

## 2010-12-24 NOTE — Telephone Encounter (Signed)
Patients wife called to inform that they want only hardcopies of prescription refills in the future.

## 2010-12-29 ENCOUNTER — Encounter: Payer: Self-pay | Admitting: Internal Medicine

## 2010-12-29 NOTE — Progress Notes (Signed)
Subjective:    Patient ID: Jonathan Orozco, male    DOB: 1928/06/14, 77 y.o.   MRN: [id number]  HPI  Here to f/u - Here to f/u; overall doing ok,  Pt denies chest pain, increased sob or doe, wheezing, orthopnea, PND, increased LE swelling, palpitations, dizziness or syncope.  Pt denies new neurological symptoms such as new headache, or facial or extremity weakness or numbness   Pt denies polydipsia, polyuria.  But did unfortunately have a relatively small laceration to left wrist after a chainsaw accident, with 3 stiches placed in ER, here to have removed.  BP at home has been < 140/90 per pt.  Does have several wks ongoing nasal allergy symptoms with clear congestion, itch and sneeze, without fever, pain, ST, cough or wheezing. Past Medical History  Diagnosis Date  . MELANOMA, MALIGNANT, SKIN NOS 09/29/2006  . HYPERLIPIDEMIA 09/29/2006  . ERECTILE DYSFUNCTION 09/29/2006  . 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 ABUSE, ALCOHOL, IN REMISSION 04/08/2007  . Other specified forms of hearing loss 08/10/2009  . HYPERTENSION 09/29/2006  . VENTRICULAR TACHYCARDIA 09/29/2006  . SINUSITIS- ACUTE-NOS 06/23/2007  . ALLERGIC RHINITIS 09/29/2006  . BENIGN PROSTATIC HYPERTROPHY 09/29/2006  . OSTEOARTHROSIS NOS, OTHER [facility] SITE 09/29/2006  . BACK PAIN 09/12/2008  . LEG PAIN, RIGHT 10/02/2008  . VERTIGO 04/08/2007  . COLONIC POLYPS, HX OF 06/23/2007  . CHEST PAIN 07/04/2008  . FATIGUE 11/16/2009  . VIRAL GASTROENTERITIS 01/29/2010  . GLUCOSE INTOLERANCE 03/19/2010  . Atrial flutter 03/28/2010  . Encounter for long-term (current) use of anticoagulants 04/26/2010   Past Surgical History  Procedure Date  . Left rotato cuff   . Joint replacement     Right knee  . Tonsillectomy   . Left knee arthroscopy     reports that he has quit smoking. He does not have any smokeless tobacco history on file. He reports that he does not drink alcohol or use illicit drugs. family history includes Diabetes in his brother. Allergies    Allergen Reactions  . Ace Inhibitors     REACTION: cough  . Amiodarone Hcl     REACTION: intolerance   Current Outpatient Prescriptions on File Prior to Visit  Medication Sig Dispense Refill  . atorvastatin (LIPITOR) 10 MG tablet Take 1 tablet (10 mg total) by mouth daily.  90 tablet  3  . diltiazem (MATZIM LA) 360 MG 24 hr tablet Take 1 tablet (360 mg total) by mouth daily.  90 tablet  3  . fish oil-omega-3 fatty acids 1000 MG capsule Take 1 g by mouth daily.        [name] HYDROcodone-acetaminophen (NORCO) 5-325 MG per tablet Take 1 tablet by mouth every 6 (six) hours as needed.  90 tablet  1  . Multiple Vitamin (MULTIVITAMIN) tablet Take 1 tablet by mouth daily.        . Tamsulosin HCl (FLOMAX) 0.4 MG CAPS Take by mouth.        . valsartan (DIOVAN) 80 MG tablet Take 1 tablet (80 mg total) by mouth daily.  90 tablet  3  . warfarin (COUMADIN) 5 MG tablet Take as directed by Anticoagulation clinic   105 tablet  3   Review of Systems Review of Systems  Constitutional: Negative for diaphoresis and unexpected weight change.  HENT: Negative for drooling and tinnitus.   Eyes: Negative for photophobia and visual disturbance.  Respiratory: Negative for choking and stridor.   Gastrointestinal: Negative for vomiting and blood in stool.  Objective:   Physical Exam BP 150/82  Pulse 79  Temp(Src) 97.4 F (36.3 C) (Oral)  Ht 6' (1.829 m)  Wt 188 lb 2 oz (85.333 kg)  BMI 25.51 kg/m2  SpO2 97% Physical Exam  VS noted Constitutional: Pt appears well-developed and well-nourished.  HENT: Head: Normocephalic.  Right Ear: External ear normal.  Left Ear: External ear normal.  Eyes: Conjunctivae and EOM are normal. Pupils are equal, round, and reactive to light.  Neck: Normal range of motion. Neck supple.  Cardiovascular: Normal rate and regular rhythm.   Pulmonary/Chest: Effort normal and breath sounds normal.  Skin: Skin is warm. No erythema. left wrist laceratin intact without sign of  infection, 3 stiches removed Psychiatric: Pt behavior is normal. Thought content normal. 1+ nervous    Assessment & Plan:

## 2010-12-29 NOTE — Assessment & Plan Note (Signed)
Ok for OTC allegra prn ,  to f/u any worsening symptoms or concerns  

## 2010-12-29 NOTE — Assessment & Plan Note (Signed)
stable overall by hx and exam, most recent data reviewed with pt, and pt to continue medical treatment as before, though I suspect he may need increased med such as diovan  BP Readings from Last 3 Encounters:  12/23/10 150/82  12/09/10 146/83  12/08/10 161/72

## 2010-12-29 NOTE — Assessment & Plan Note (Signed)
stable overall by hx and exam, most recent data reviewed with pt, and pt to continue medical treatment as before  Lab Results  Component Value Date   WBC 6.6 03/19/2010   HGB 14.6 03/19/2010   HCT 43.1 03/19/2010   PLT 230.0 03/19/2010   GLUCOSE 88 03/19/2010   CHOL 131 03/19/2010   TRIG 60.0 03/19/2010   HDL 45.70 03/19/2010   LDLDIRECT 159.2 09/29/2006   LDLCALC 73 03/19/2010   ALT 21 03/19/2010   AST 23 03/19/2010   NA 139 03/19/2010   K 4.5 03/19/2010   CL 105 03/19/2010   CREATININE 0.9 03/19/2010   BUN 12 03/19/2010   CO2 30 03/19/2010   TSH 0.71 03/19/2010   PSA 1.96 03/16/2009   INR 3.4 12/05/2010

## 2010-12-29 NOTE — Assessment & Plan Note (Signed)
Improved, for stitches out today,  to f/u any worsening symptoms or concerns

## 2011-01-02 ENCOUNTER — Other Ambulatory Visit: Payer: Self-pay

## 2011-01-02 ENCOUNTER — Ambulatory Visit (INDEPENDENT_AMBULATORY_CARE_PROVIDER_SITE_OTHER): Payer: Medicare Other | Admitting: *Deleted

## 2011-01-02 DIAGNOSIS — Z7901 Long term (current) use of anticoagulants: Secondary | ICD-10-CM

## 2011-01-02 DIAGNOSIS — I4892 Unspecified atrial flutter: Secondary | ICD-10-CM

## 2011-01-02 MED ORDER — DILTIAZEM HCL ER COATED BEADS 360 MG PO TB24: 360.0000 mg | ORAL_TABLET | Freq: Every day | ORAL | Status: DC

## 2011-01-02 NOTE — Telephone Encounter (Signed)
Patients wife called to request #7 called in of Diltiazem to [employer] [employer] to cover until mail order comes in. Call back number is [phone] or [phone]

## 2011-01-30 ENCOUNTER — Ambulatory Visit (INDEPENDENT_AMBULATORY_CARE_PROVIDER_SITE_OTHER): Payer: Medicare Other | Admitting: *Deleted

## 2011-01-30 DIAGNOSIS — Z7901 Long term (current) use of anticoagulants: Secondary | ICD-10-CM

## 2011-01-30 DIAGNOSIS — I4892 Unspecified atrial flutter: Secondary | ICD-10-CM

## 2011-02-25 ENCOUNTER — Other Ambulatory Visit: Payer: Self-pay

## 2011-02-27 ENCOUNTER — Encounter: Payer: Medicare Other | Admitting: *Deleted

## 2011-03-04 ENCOUNTER — Ambulatory Visit (INDEPENDENT_AMBULATORY_CARE_PROVIDER_SITE_OTHER): Payer: Medicare Other | Admitting: *Deleted

## 2011-03-04 DIAGNOSIS — Z7901 Long term (current) use of anticoagulants: Secondary | ICD-10-CM

## 2011-03-04 DIAGNOSIS — I4892 Unspecified atrial flutter: Secondary | ICD-10-CM

## 2011-03-04 LAB — POCT INR: INR: 2.2

## 2011-03-21 ENCOUNTER — Ambulatory Visit (INDEPENDENT_AMBULATORY_CARE_PROVIDER_SITE_OTHER): Payer: Medicare Other | Admitting: Internal Medicine

## 2011-03-21 ENCOUNTER — Other Ambulatory Visit (INDEPENDENT_AMBULATORY_CARE_PROVIDER_SITE_OTHER): Payer: Medicare Other

## 2011-03-21 ENCOUNTER — Encounter: Payer: Self-pay | Admitting: Internal Medicine

## 2011-03-21 VITALS — BP 132/70 | HR 70 | Temp 97.0°F | Resp 14 | Ht 72.0 in | Wt 183.0 lb

## 2011-03-21 DIAGNOSIS — R7309 Other abnormal glucose: Secondary | ICD-10-CM

## 2011-03-21 DIAGNOSIS — Z0000 Encounter for general adult medical examination without abnormal findings: Secondary | ICD-10-CM

## 2011-03-21 DIAGNOSIS — R7302 Impaired glucose tolerance (oral): Secondary | ICD-10-CM

## 2011-03-21 DIAGNOSIS — Z79899 Other long term (current) drug therapy: Secondary | ICD-10-CM

## 2011-03-21 LAB — CBC WITH DIFFERENTIAL/PLATELET
Basophils Relative: 0.5 % (ref 0.0–3.0)
Eosinophils Absolute: 0 10*3/uL (ref 0.0–0.7)
Eosinophils Relative: 0.6 % (ref 0.0–5.0)
HCT: 47.3 % (ref 39.0–52.0)
Lymphs Abs: 3.5 10*3/uL (ref 0.7–4.0)
MCHC: 33.5 g/dL (ref 30.0–36.0)
MCV: 95.1 fl (ref 78.0–100.0)
Monocytes Absolute: 0.7 10*3/uL (ref 0.1–1.0)
Neutro Abs: 3.3 10*3/uL (ref 1.4–7.7)
Neutrophils Relative %: 43.6 % (ref 43.0–77.0)
RBC: 4.98 Mil/uL (ref 4.22–5.81)

## 2011-03-21 LAB — BASIC METABOLIC PANEL
BUN: 14 mg/dL (ref 6–23)
CO2: 28 mEq/L (ref 19–32)
Calcium: 9.4 mg/dL (ref 8.4–10.5)
GFR: 104.59 mL/min (ref >60.00)
Glucose, Bld: 98 mg/dL (ref 70–99)
Potassium: 4.6 mEq/L (ref 3.5–5.1)

## 2011-03-21 LAB — LIPID PANEL
Cholesterol: 125 mg/dL (ref 0–200)
HDL: 52.4 mg/dL (ref >39.00)
VLDL: 7.2 mg/dL (ref 0.0–40.0)

## 2011-03-21 LAB — URINALYSIS, ROUTINE W REFLEX MICROSCOPIC
Bilirubin Urine: NEGATIVE
Hgb urine dipstick: NEGATIVE
Ketones, ur: NEGATIVE
Total Protein, Urine: NEGATIVE
Urine Glucose: NEGATIVE
pH: 6 (ref 5.0–8.0)

## 2011-03-21 LAB — HEPATIC FUNCTION PANEL
ALT: 21 U/L (ref 0–53)
Bilirubin, Direct: 0.2 mg/dL (ref 0.0–0.3)
Total Bilirubin: 0.9 mg/dL (ref 0.3–1.2)

## 2011-03-21 NOTE — Patient Instructions (Addendum)
Continue all other medications as before Please have the pharmacy call if you need any medication refill You are up to date with all recommended prevention measures Please return in 1 year for your yearly visit, or sooner if needed, with Lab testing done 3-5 days before

## 2011-03-22 ENCOUNTER — Encounter: Payer: Self-pay | Admitting: Internal Medicine

## 2011-03-22 NOTE — Progress Notes (Signed)
Subjective:    Patient ID: Jonathan Orozco, male    DOB: [birth year], 78 y.o.   MRN: [id number]  HPI  Here for wellness and f/u;  Overall doing ok;  Pt denies CP, worsening SOB, DOE, wheezing, orthopnea, PND, worsening LE edema, palpitations, dizziness or syncope.  Pt denies neurological change such as new Headache, facial or extremity weakness.  Pt denies polydipsia, polyuria, or low sugar symptoms. Pt states overall good compliance with treatment and medications, good tolerability, and trying to follow lower cholesterol diet.  Pt denies worsening depressive symptoms, suicidal ideation or panic. No fever, wt loss, night sweats, loss of appetite, or other constitutional symptoms.  Pt states good ability with ADL's, low fall risk, home safety reviewed and adequate, no significant changes in hearing or vision, and occasionally active with exercise.  No acute complaints today Past Medical History  Diagnosis Date  . MELANOMA, MALIGNANT, SKIN NOS 09/29/2006  . HYPERLIPIDEMIA 09/29/2006  . ERECTILE DYSFUNCTION 09/29/2006  . 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 ABUSE, ALCOHOL, IN REMISSION 04/08/2007  . Other specified forms of hearing loss 08/10/2009  . HYPERTENSION 09/29/2006  . VENTRICULAR TACHYCARDIA 09/29/2006  . SINUSITIS- ACUTE-NOS 06/23/2007  . ALLERGIC RHINITIS 09/29/2006  . BENIGN PROSTATIC HYPERTROPHY 09/29/2006  . OSTEOARTHROSIS NOS, OTHER [facility] SITE 09/29/2006  . BACK PAIN 09/12/2008  . LEG PAIN, RIGHT 10/02/2008  . VERTIGO 04/08/2007  . COLONIC POLYPS, HX OF 06/23/2007  . CHEST PAIN 07/04/2008  . FATIGUE 11/16/2009  . VIRAL GASTROENTERITIS 01/29/2010  . GLUCOSE INTOLERANCE 03/19/2010  . Atrial flutter 03/28/2010  . Encounter for long-term (current) use of anticoagulants 04/26/2010   Past Surgical History  Procedure Date  . Left rotato cuff   . Joint replacement     Right knee  . Tonsillectomy   . Left knee arthroscopy     reports that he has quit smoking. He does not have any smokeless  tobacco history on file. He reports that he does not drink alcohol or use illicit drugs. family history includes Diabetes in his brother. Allergies  Allergen Reactions  . Ace Inhibitors     REACTION: cough  . Amiodarone Hcl     REACTION: intolerance   Current Outpatient Prescriptions on File Prior to Visit  Medication Sig Dispense Refill  . atorvastatin (LIPITOR) 10 MG tablet Take 1 tablet (10 mg total) by mouth daily.  90 tablet  3  . diltiazem (MATZIM LA) 360 MG 24 hr tablet Take 1 tablet (360 mg total) by mouth daily.  7 tablet  0  . fish oil-omega-3 fatty acids 1000 MG capsule Take 1 g by mouth daily.        [name] HYDROcodone-acetaminophen (NORCO) 5-325 MG per tablet Take 1 tablet by mouth every 6 (six) hours as needed.  90 tablet  1  . Multiple Vitamin (MULTIVITAMIN) tablet Take 1 tablet by mouth daily.        . Tamsulosin HCl (FLOMAX) 0.4 MG CAPS Take by mouth.        . valsartan (DIOVAN) 80 MG tablet Take 1 tablet (80 mg total) by mouth daily.  90 tablet  3  . warfarin (COUMADIN) 5 MG tablet Take as directed by Anticoagulation clinic   105 tablet  3   Review of Systems Review of Systems  Constitutional: Negative for diaphoresis, activity change, appetite change and unexpected weight change.  HENT: Negative for hearing loss, ear pain, facial swelling, mouth sores and neck stiffness.   Eyes: Negative for pain, redness and  visual disturbance.  Respiratory: Negative for shortness of breath and wheezing.   Cardiovascular: Negative for chest pain and palpitations.  Gastrointestinal: Negative for diarrhea, blood in stool, abdominal distention and rectal pain.  Genitourinary: Negative for hematuria, flank pain and decreased urine volume.  Musculoskeletal: Negative for myalgias and joint swelling.  Skin: Negative for color change and wound.  Neurological: Negative for syncope and numbness.  Hematological: Negative for adenopathy.  Psychiatric/Behavioral: Negative for hallucinations,  self-injury, decreased concentration and agitation.      Objective:   Physical Exam BP 132/70  Pulse 70  Temp(Src) 97 F (36.1 C) (Oral)  Resp 14  Ht 6' (1.829 m)  Wt 183 lb (83.008 kg)  BMI 24.82 kg/m2  SpO2 97% Physical Exam  VS noted Constitutional: Pt is oriented to person, place, and time. Appears well-developed and well-nourished.  HENT:  Head: Normocephalic and atraumatic.  Right Ear: External ear normal.  Left Ear: External ear normal.  Nose: Nose normal.  Mouth/Throat: Oropharynx is clear and moist.  Eyes: Conjunctivae and EOM are normal. Pupils are equal, round, and reactive to light.  Neck: Normal range of motion. Neck supple. No JVD present. No tracheal deviation present.  Cardiovascular: Normal rate, regular rhythm, normal heart sounds and intact distal pulses.   Pulmonary/Chest: Effort normal and breath sounds normal.  Abdominal: Soft. Bowel sounds are normal. There is no tenderness.  Musculoskeletal: Normal range of motion. Exhibits no edema.  Lymphadenopathy:  Has no cervical adenopathy.  Neurological: Pt is alert and oriented to person, place, and time. Pt has normal reflexes. No cranial nerve deficit.  Skin: Skin is warm and dry. No rash noted.  Psychiatric:  Has  normal mood and affect. Behavior is normal.     Assessment & Plan:

## 2011-03-22 NOTE — Assessment & Plan Note (Signed)
stable overall by hx and exam, most recent data reviewed with pt, and pt to continue medical treatment as before Lab Results  Component Value Date   HGBA1C 5.8 03/21/2011

## 2011-04-17 ENCOUNTER — Ambulatory Visit (INDEPENDENT_AMBULATORY_CARE_PROVIDER_SITE_OTHER): Payer: Medicare Other

## 2011-04-17 DIAGNOSIS — I4892 Unspecified atrial flutter: Secondary | ICD-10-CM

## 2011-04-17 DIAGNOSIS — Z7901 Long term (current) use of anticoagulants: Secondary | ICD-10-CM

## 2011-04-17 LAB — POCT INR: INR: 2.9

## 2011-05-01 ENCOUNTER — Other Ambulatory Visit: Payer: Self-pay

## 2011-05-01 MED ORDER — HYDROCODONE-ACETAMINOPHEN 5-325 MG PO TABS: 1.0000 | ORAL_TABLET | Freq: Four times a day (QID) | ORAL | Status: DC | PRN

## 2011-05-01 NOTE — Telephone Encounter (Signed)
Done hardcopy to dahlia/LIM B  

## 2011-05-01 NOTE — Telephone Encounter (Signed)
Refill to be faxed to [facility] per pt. Request.

## 2011-05-01 NOTE — Telephone Encounter (Signed)
Rx faxed to pharmacy  

## 2011-05-05 ENCOUNTER — Other Ambulatory Visit: Payer: Self-pay

## 2011-05-05 MED ORDER — DILTIAZEM HCL ER COATED BEADS 360 MG PO TB24: 360.0000 mg | ORAL_TABLET | Freq: Every day | ORAL | Status: DC

## 2011-05-19 ENCOUNTER — Ambulatory Visit (INDEPENDENT_AMBULATORY_CARE_PROVIDER_SITE_OTHER): Payer: Medicare Other | Admitting: Pharmacist

## 2011-05-19 DIAGNOSIS — I4892 Unspecified atrial flutter: Secondary | ICD-10-CM

## 2011-05-19 DIAGNOSIS — Z7901 Long term (current) use of anticoagulants: Secondary | ICD-10-CM

## 2011-05-19 LAB — POCT INR: INR: 2.9

## 2011-05-27 ENCOUNTER — Encounter: Payer: Self-pay | Admitting: Endocrinology

## 2011-05-27 ENCOUNTER — Ambulatory Visit (INDEPENDENT_AMBULATORY_CARE_PROVIDER_SITE_OTHER): Payer: Medicare Other | Admitting: Endocrinology

## 2011-05-27 VITALS — BP 138/84 | HR 75 | Temp 97.2°F | Ht 72.0 in | Wt 179.0 lb

## 2011-05-27 DIAGNOSIS — J069 Acute upper respiratory infection, unspecified: Secondary | ICD-10-CM

## 2011-05-27 MED ORDER — CEFUROXIME AXETIL 250 MG PO TABS: 250.0000 mg | ORAL_TABLET | Freq: Two times a day (BID) | ORAL | Status: AC

## 2011-05-27 MED ORDER — PROMETHAZINE-CODEINE 6.25-10 MG/5ML PO SYRP: 5.0000 mL | ORAL_SOLUTION | ORAL | Status: AC | PRN

## 2011-05-27 NOTE — Progress Notes (Signed)
Subjective:    Patient ID: Jonathan Orozco, male    DOB: Jun 08, 1928, 78 y.o.   MRN: [id number]  HPI Pt states few days of slight pain at the throat, and assoc nasal congestion.  He has a dry cough. Past Medical History  Diagnosis Date  . MELANOMA, MALIGNANT, SKIN NOS 09/29/2006  . HYPERLIPIDEMIA 09/29/2006  . ERECTILE DYSFUNCTION 09/29/2006  . 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 ABUSE, ALCOHOL, IN REMISSION 04/08/2007  . Other specified forms of hearing loss 08/10/2009  . HYPERTENSION 09/29/2006  . VENTRICULAR TACHYCARDIA 09/29/2006  . SINUSITIS- ACUTE-NOS 06/23/2007  . ALLERGIC RHINITIS 09/29/2006  . BENIGN PROSTATIC HYPERTROPHY 09/29/2006  . OSTEOARTHROSIS NOS, OTHER [facility] SITE 09/29/2006  . BACK PAIN 09/12/2008  . LEG PAIN, RIGHT 10/02/2008  . VERTIGO 04/08/2007  . COLONIC POLYPS, HX OF 06/23/2007  . CHEST PAIN 07/04/2008  . FATIGUE 11/16/2009  . VIRAL GASTROENTERITIS 01/29/2010  . GLUCOSE INTOLERANCE 03/19/2010  . Atrial flutter 03/28/2010  . Encounter for long-term (current) use of anticoagulants 04/26/2010    Past Surgical History  Procedure Date  . Left rotato cuff   . Joint replacement     Right knee  . Tonsillectomy   . Left knee arthroscopy     History   Social History  . Marital Status: Married    Spouse Name: N/A    Number of Children: N/A  . Years of Education: N/A   Occupational History  . Not on file.   Social History Main Topics  . Smoking status: Former Games developer  . Smokeless tobacco: Not on file   Comment: Quit smoking and drinking 20 years ago  . Alcohol Use: No  . Drug Use: No  . Sexually Active:    Other Topics Concern  . Not on file   Social History Narrative   Married, 3 children. Retired Airline pilot. Lives in [place] with  wife and kids. He is retired from Airline pilot.     Current Outpatient Prescriptions on File Prior to Visit  Medication Sig Dispense Refill  . atorvastatin (LIPITOR) 10 MG tablet Take 1 tablet (10 mg total) by mouth daily.  90 tablet  3  .  diltiazem (MATZIM LA) 360 MG 24 hr tablet Take 1 tablet (360 mg total) by mouth daily.  90 tablet  3  . fish oil-omega-3 fatty acids 1000 MG capsule Take 1 g by mouth daily.        [name] HYDROcodone-acetaminophen (NORCO) 5-325 MG per tablet Take 1 tablet by mouth every 6 (six) hours as needed.  90 tablet  1  . Multiple Vitamin (MULTIVITAMIN) tablet Take 1 tablet by mouth daily.        . Tamsulosin HCl (FLOMAX) 0.4 MG CAPS Take by mouth.        . warfarin (COUMADIN) 5 MG tablet Take as directed by Anticoagulation clinic   105 tablet  3    Allergies  Allergen Reactions  . Ace Inhibitors     REACTION: cough  . Amiodarone Hcl     REACTION: intolerance    Family History  Problem Relation Age of Onset  . Diabetes Brother    BP 138/84  Pulse 75  Temp(Src) 97.2 F (36.2 C) (Oral)  Ht 6' (1.829 m)  Wt 179 lb (81.194 kg)  BMI 24.28 kg/m2  SpO2 96%   Review of Systems Denies wheezing    Objective:   Physical Exam VITAL SIGNS:  See vs page GENERAL: no distress.   head: no deformity. eyes: no  periorbital swelling, no proptosis external nose and ears are normal mouth: no lesion seen Both eac's and tm's are normal NECK: There is no palpable thyroid enlargement.  No thyroid nodule is palpable.  No palpable lymphadenopathy at the anterior neck. LUNGS:  Clear to auscultation     Assessment & Plan:  URI, new

## 2011-05-27 NOTE — Patient Instructions (Addendum)
i have sent a prescription to your pharmacy, for an antibiotic Here is a prescription for cough syrup I hope you feel better soon.  If you don't feel better by next week, please call back.

## 2011-05-29 ENCOUNTER — Ambulatory Visit (INDEPENDENT_AMBULATORY_CARE_PROVIDER_SITE_OTHER)
Admission: RE | Admit: 2011-05-29 | Discharge: 2011-05-29 | Disposition: A | Discharge status: Home / Self Care | Payer: Medicare Other | Source: Ambulatory Visit | Attending: Internal Medicine | Admitting: Internal Medicine

## 2011-05-29 ENCOUNTER — Ambulatory Visit (INDEPENDENT_AMBULATORY_CARE_PROVIDER_SITE_OTHER): Payer: Medicare Other | Admitting: Internal Medicine

## 2011-05-29 ENCOUNTER — Encounter: Payer: Self-pay | Admitting: Internal Medicine

## 2011-05-29 VITALS — BP 120/72 | HR 80 | Temp 99.3°F | Ht 72.0 in | Wt 180.0 lb

## 2011-05-29 DIAGNOSIS — R05 Cough: Secondary | ICD-10-CM

## 2011-05-29 DIAGNOSIS — I10 Essential (primary) hypertension: Secondary | ICD-10-CM

## 2011-05-29 DIAGNOSIS — R7309 Other abnormal glucose: Secondary | ICD-10-CM

## 2011-05-29 DIAGNOSIS — R7302 Impaired glucose tolerance (oral): Secondary | ICD-10-CM

## 2011-05-29 NOTE — Patient Instructions (Signed)
OK to continue your current medications, including the generic ceftin, and the cough medicine Please go to XRAY in the Basement for the x-ray test If the CXR does show Pneumonia, we will likely need to change your antibiotic Continue all other medications as before Please have the pharmacy call with any refills you may need.

## 2011-06-01 ENCOUNTER — Encounter: Payer: Self-pay | Admitting: Internal Medicine

## 2011-06-01 NOTE — Assessment & Plan Note (Signed)
stable overall by hx and exam, most recent data reviewed with pt, and pt to continue medical treatment as before  BP Readings from Last 3 Encounters:  05/29/11 120/72  05/27/11 138/84  03/21/11 132/70

## 2011-06-01 NOTE — Progress Notes (Signed)
Subjective:    Patient ID: Jonathan Orozco, male    DOB: 05-17-1928, 78 y.o.   MRN: [id number]  HPI  Here to f/u after being seen recently per Dr Everardo All, tx with ceftin and cough med, and overall dong ok except concerned as he is thinking his cough may be more productive, but no worsening fever , ST, freq of cough and Pt denies chest pain, increased sob or doe, wheezing, orthopnea, PND, increased LE swelling, palpitations, dizziness or syncope.   Pt denies polydipsia, polyuria. Denies worsening depressive symptoms, suicidal ideation, or panic.  No worsening confusion, weakness or falls.    Past Medical History  Diagnosis Date  . MELANOMA, MALIGNANT, SKIN NOS 09/29/2006  . HYPERLIPIDEMIA 09/29/2006  . ERECTILE DYSFUNCTION 09/29/2006  . 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 ABUSE, ALCOHOL, IN REMISSION 04/08/2007  . Other specified forms of hearing loss 08/10/2009  . HYPERTENSION 09/29/2006  . VENTRICULAR TACHYCARDIA 09/29/2006  . SINUSITIS- ACUTE-NOS 06/23/2007  . ALLERGIC RHINITIS 09/29/2006  . BENIGN PROSTATIC HYPERTROPHY 09/29/2006  . OSTEOARTHROSIS NOS, OTHER [facility] SITE 09/29/2006  . BACK PAIN 09/12/2008  . LEG PAIN, RIGHT 10/02/2008  . VERTIGO 04/08/2007  . COLONIC POLYPS, HX OF 06/23/2007  . CHEST PAIN 07/04/2008  . FATIGUE 11/16/2009  . VIRAL GASTROENTERITIS 01/29/2010  . GLUCOSE INTOLERANCE 03/19/2010  . Atrial flutter 03/28/2010  . Encounter for long-term (current) use of anticoagulants 04/26/2010   Past Surgical History  Procedure Date  . Left rotato cuff   . Joint replacement     Right knee  . Tonsillectomy   . Left knee arthroscopy     reports that he has quit smoking. He does not have any smokeless tobacco history on file. He reports that he does not drink alcohol or use illicit drugs. family history includes Diabetes in his brother. Allergies  Allergen Reactions  . Ace Inhibitors     REACTION: cough  . Amiodarone Hcl     REACTION: intolerance   Current Outpatient  Prescriptions on File Prior to Visit  Medication Sig Dispense Refill  . atorvastatin (LIPITOR) 10 MG tablet Take 1 tablet (10 mg total) by mouth daily.  90 tablet  3  . cefUROXime (CEFTIN) 250 MG tablet Take 1 tablet (250 mg total) by mouth 2 (two) times daily.  14 tablet  0  . diltiazem (MATZIM LA) 360 MG 24 hr tablet Take 1 tablet (360 mg total) by mouth daily.  90 tablet  3  . fish oil-omega-3 fatty acids 1000 MG capsule Take 1 g by mouth daily.        [name] HYDROcodone-acetaminophen (NORCO) 5-325 MG per tablet Take 1 tablet by mouth every 6 (six) hours as needed.  90 tablet  1  . Multiple Vitamin (MULTIVITAMIN) tablet Take 1 tablet by mouth daily.        . promethazine-codeine (PHENERGAN WITH CODEINE) 6.25-10 MG/5ML syrup Take 5 mLs by mouth every 4 (four) hours as needed for cough.  240 mL  0  . Tamsulosin HCl (FLOMAX) 0.4 MG CAPS Take by mouth.        . warfarin (COUMADIN) 5 MG tablet Take as directed by Anticoagulation clinic   105 tablet  3   Review of Systems Review of Systems  Constitutional: Negative for diaphoresis and unexpected weight change.  HENT: Negative for drooling and tinnitus.   Eyes: Negative for photophobia and visual disturbance.  Respiratory: Negative for choking and stridor.   Gastrointestinal: Negative for vomiting and blood in stool.  Genitourinary: Negative for hematuria and decreased urine volume.  Musculoskeletal: Negative for gait problem.  Neurological: Negative for tremors and numbness.     Objective:   Physical Exam BP 120/72  Pulse 80  Temp(Src) 99.3 F (37.4 C) (Oral)  Ht 6' (1.829 m)  Wt 180 lb (81.647 kg)  BMI 24.41 kg/m2  SpO2 96% Physical Exam  VS noted, mild ill Constitutional: Pt appears well-developed and well-nourished.  HENT: Head: Normocephalic.  Right Ear: External ear normal.  Left Ear: External ear normal.  Bilat tm's mild erythema.  Sinus nontender.  Pharynx mild erythema Eyes: Conjunctivae and EOM are normal. Pupils are equal,  round, and reactive to light.  Neck: Normal range of motion. Neck supple.  Cardiovascular: Normal rate and regular rhythm.   Pulmonary/Chest: Effort normal and breath sounds without rales or wheezing Neurological: Pt is alert. No cranial nerve deficit.  Skin: Skin is warm. No erythema.  Psychiatric: Pt behavior is normal. Thought content normal. 1+ nervous    Assessment & Plan:

## 2011-06-01 NOTE — Assessment & Plan Note (Signed)
stable overall by hx and exam, most recent data reviewed with pt, and pt to continue medical treatment as before  Lab Results  Component Value Date   HGBA1C 5.8 03/21/2011

## 2011-06-01 NOTE — Assessment & Plan Note (Signed)
With ? Worsening productivity, exam o/w stable, for CXR - of pna will need change or additional antibx, Continue all other medications as before for now,  to f/u any worsening symptoms or concerns

## 2011-07-01 ENCOUNTER — Ambulatory Visit (INDEPENDENT_AMBULATORY_CARE_PROVIDER_SITE_OTHER): Payer: Medicare Other | Admitting: Pharmacist

## 2011-07-01 DIAGNOSIS — Z7901 Long term (current) use of anticoagulants: Secondary | ICD-10-CM

## 2011-07-01 DIAGNOSIS — I4892 Unspecified atrial flutter: Secondary | ICD-10-CM

## 2011-07-01 LAB — POCT INR: INR: 1.8

## 2011-07-30 ENCOUNTER — Ambulatory Visit (INDEPENDENT_AMBULATORY_CARE_PROVIDER_SITE_OTHER): Payer: Medicare Other | Admitting: *Deleted

## 2011-07-30 DIAGNOSIS — I4892 Unspecified atrial flutter: Secondary | ICD-10-CM

## 2011-07-30 DIAGNOSIS — Z7901 Long term (current) use of anticoagulants: Secondary | ICD-10-CM

## 2011-07-30 LAB — POCT INR: INR: 2.3

## 2011-09-03 ENCOUNTER — Ambulatory Visit (INDEPENDENT_AMBULATORY_CARE_PROVIDER_SITE_OTHER): Payer: Medicare Other | Admitting: *Deleted

## 2011-09-03 DIAGNOSIS — Z7901 Long term (current) use of anticoagulants: Secondary | ICD-10-CM

## 2011-09-03 DIAGNOSIS — I4892 Unspecified atrial flutter: Secondary | ICD-10-CM

## 2011-10-08 ENCOUNTER — Encounter: Payer: Self-pay | Admitting: General Practice

## 2011-10-10 ENCOUNTER — Ambulatory Visit (INDEPENDENT_AMBULATORY_CARE_PROVIDER_SITE_OTHER): Payer: Medicare Other | Admitting: Pharmacist

## 2011-10-10 ENCOUNTER — Ambulatory Visit (INDEPENDENT_AMBULATORY_CARE_PROVIDER_SITE_OTHER): Payer: Medicare Other | Admitting: Internal Medicine

## 2011-10-10 ENCOUNTER — Encounter: Payer: Self-pay | Admitting: Internal Medicine

## 2011-10-10 ENCOUNTER — Other Ambulatory Visit: Payer: Self-pay | Admitting: General Practice

## 2011-10-10 VITALS — BP 138/80 | HR 76 | Temp 97.0°F | Resp 16 | Wt 172.2 lb

## 2011-10-10 DIAGNOSIS — Z7901 Long term (current) use of anticoagulants: Secondary | ICD-10-CM

## 2011-10-10 DIAGNOSIS — R5383 Other fatigue: Secondary | ICD-10-CM

## 2011-10-10 DIAGNOSIS — Z0000 Encounter for general adult medical examination without abnormal findings: Secondary | ICD-10-CM

## 2011-10-10 DIAGNOSIS — H9193 Unspecified hearing loss, bilateral: Secondary | ICD-10-CM

## 2011-10-10 DIAGNOSIS — R7309 Other abnormal glucose: Secondary | ICD-10-CM

## 2011-10-10 DIAGNOSIS — R7302 Impaired glucose tolerance (oral): Secondary | ICD-10-CM

## 2011-10-10 DIAGNOSIS — H9190 Unspecified hearing loss, unspecified ear: Secondary | ICD-10-CM

## 2011-10-10 DIAGNOSIS — I10 Essential (primary) hypertension: Secondary | ICD-10-CM

## 2011-10-10 DIAGNOSIS — I4892 Unspecified atrial flutter: Secondary | ICD-10-CM

## 2011-10-10 DIAGNOSIS — E785 Hyperlipidemia, unspecified: Secondary | ICD-10-CM

## 2011-10-10 DIAGNOSIS — R5381 Other malaise: Secondary | ICD-10-CM

## 2011-10-10 DIAGNOSIS — Z23 Encounter for immunization: Secondary | ICD-10-CM

## 2011-10-10 MED ORDER — WARFARIN SODIUM 5 MG PO TABS: ORAL_TABLET | ORAL | Status: DC

## 2011-10-10 NOTE — Assessment & Plan Note (Signed)
Ok for ear irrigation bilat wax impactions

## 2011-10-10 NOTE — Progress Notes (Signed)
Subjective:    Patient ID: Jonathan Orozco, male    DOB: 1928/11/27, 78 y.o.   MRN: [id number]  HPI  Here to f/u; overall doing well, but does states he tends to overdo fruits in his diet such as grapes, which leads to intermittent loose stools, no blood, abd pain, n/v, fever.  Last coloscopy 2009, with colon polyp x 3/diverticulosis.  Has lost 8 lbs since last visit intentionally by cutting out sugar and starches.  Does also have intermittent "good and bad days" with fatigue, has been researching his meds and asks if he can hold on the lipitor to see if could be related, such as a drug holiday.  Pt denies chest pain, increased sob or doe, wheezing, orthopnea, PND, increased LE swelling, palpitations, dizziness or syncope.   Pt denies polydipsia, polyuria.  Pt denies new neurological symptoms such as new headache, or facial or extremity weakness or numbness  Otherwise seems in good spirits today, states " I want to live to be the world oldest man at 4."  Denies worsening depressive symptoms, suicidal ideation, or panic.   Pt denies fever, wt loss, night sweats, loss of appetite, or other constitutional symptoms  No worsening prostatism or dysuria.  Has ongoing chronic nonprod cough no change. Past Medical History  Diagnosis Date  . MELANOMA, MALIGNANT, SKIN NOS 09/29/2006  . HYPERLIPIDEMIA 09/29/2006  . ERECTILE DYSFUNCTION 09/29/2006  . 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 ABUSE, ALCOHOL, IN REMISSION 04/08/2007  . Other specified forms of hearing loss 08/10/2009  . HYPERTENSION 09/29/2006  . VENTRICULAR TACHYCARDIA 09/29/2006  . SINUSITIS- ACUTE-NOS 06/23/2007  . ALLERGIC RHINITIS 09/29/2006  . BENIGN PROSTATIC HYPERTROPHY 09/29/2006  . OSTEOARTHROSIS NOS, OTHER [facility] SITE 09/29/2006  . BACK PAIN 09/12/2008  . LEG PAIN, RIGHT 10/02/2008  . VERTIGO 04/08/2007  . COLONIC POLYPS, HX OF 06/23/2007  . CHEST PAIN 07/04/2008  . FATIGUE 11/16/2009  . VIRAL GASTROENTERITIS 01/29/2010  . GLUCOSE INTOLERANCE  03/19/2010  . Atrial flutter 03/28/2010  . Encounter for long-term (current) use of anticoagulants 04/26/2010   Past Surgical History  Procedure Date  . Left rotato cuff   . Joint replacement     Right knee  . Tonsillectomy   . Left knee arthroscopy     reports that he has quit smoking. He does not have any smokeless tobacco history on file. He reports that he does not drink alcohol or use illicit drugs. family history includes Diabetes in his brother. Allergies  Allergen Reactions  . Ace Inhibitors     REACTION: cough  . Amiodarone Hcl     REACTION: intolerance   Current Outpatient Prescriptions on File Prior to Visit  Medication Sig Dispense Refill  . atorvastatin (LIPITOR) 10 MG tablet Take 1 tablet (10 mg total) by mouth daily.  90 tablet  3  . diltiazem (MATZIM LA) 360 MG 24 hr tablet Take 1 tablet (360 mg total) by mouth daily.  90 tablet  3  . fish oil-omega-3 fatty acids 1000 MG capsule Take 1 g by mouth daily.        [name] HYDROcodone-acetaminophen (NORCO) 5-325 MG per tablet Take 1 tablet by mouth every 6 (six) hours as needed.  90 tablet  1  . Multiple Vitamin (MULTIVITAMIN) tablet Take 1 tablet by mouth daily.        . Tamsulosin HCl (FLOMAX) 0.4 MG CAPS Take by mouth.        . DISCONTD: warfarin (COUMADIN) 5 MG tablet Take as directed  by Anticoagulation clinic   105 tablet  3   Review of Systems Constitutional: Negative for diaphoresis HENT: Negative for tinnitus.   Eyes: Negative for photophobia and visual disturbance.  Respiratory: Negative for choking and stridor.   Gastrointestinal: Negative for vomiting and blood in stool.  Genitourinary: Negative for hematuria and decreased urine volume.  Musculoskeletal: Negative for gait problem.  Skin: Negative for color change and wound.  Neurological: Negative for tremors and numbness.  Psychiatric/Behavioral: Negative for decreased concentration. The patient is not hyperactive.   Wants flu shot, and has about 1 wk  worsening bilat ear hearing loss - needs wax removed, tried the OTC ear wax removal kit    Objective:   Physical Exam BP 138/80  Pulse 76  Temp 97 F (36.1 C) (Oral)  Resp 16  Wt 172 lb 4 oz (78.132 kg)  SpO2 96% Physical Exam  VS noted Constitutional: Pt appears well-developed and well-nourished.  HENT: Head: Normocephalic.  Right Ear: External ear normal.  Left Ear: External ear normal.  Eyes: Conjunctivae and EOM are normal. Pupils are equal, round, and reactive to light.  Neck: Normal range of motion. Neck supple.  Cardiovascular: Normal rate and regular rhythm.   Pulmonary/Chest: Effort normal and breath sounds decreased, no rales or wheezing  Abd:  Soft, NT, non-distended, + BS - benign Neurological: Pt is alert. Motor intact  Skin: Skin is warm. No erythema. No rash Psychiatric: Pt behavior is normal. Thought content normal. 1+ nervous    Assessment & Plan:

## 2011-10-10 NOTE — Addendum Note (Signed)
Addended by: Jackson Latino on: 10/10/2011 04:47 PM   Modules accepted: Orders

## 2011-10-10 NOTE — Patient Instructions (Addendum)
You had the flu shot today OK to hold on taking the Lipitor for 4 wks, but this should be re-started if your fatigued days are not improved Please continue your efforts at being more active, low cholesterol diet, and weight control (no need to lose more weight) Your ears were irrigated today of wax Please keep your appointments with your specialists as you have planned - the coumadin clinic at [facility] next month Please return in 6 mo with Lab testing done 3-5 days before

## 2011-10-10 NOTE — Assessment & Plan Note (Signed)
stable overall by hx and exam, most recent data reviewed with pt, and pt to continue medical treatment as before BP Readings from Last 3 Encounters:  10/10/11 138/80  05/29/11 120/72  05/27/11 138/84

## 2011-10-10 NOTE — Assessment & Plan Note (Signed)
No hx of cad, acs, or recent cva - ok for trial off lipitor, but should re-start if fatigue not obviously improved

## 2011-10-10 NOTE — Assessment & Plan Note (Signed)
Intermittent gen'd, etiology unclear, exam benign,, most recent data reviewed with pt including feb 2013 labs and April 2013 cxr, 2009 colonoscopy, and pt to continue medical treatment as before except ok to hold on lipitor for 4 wk trial to see if relates to his symptoms;  If not should re-start the lipitor

## 2011-11-10 ENCOUNTER — Ambulatory Visit (INDEPENDENT_AMBULATORY_CARE_PROVIDER_SITE_OTHER): Payer: Medicare Other | Admitting: General Practice

## 2011-11-10 DIAGNOSIS — I4892 Unspecified atrial flutter: Secondary | ICD-10-CM

## 2011-11-10 DIAGNOSIS — Z7901 Long term (current) use of anticoagulants: Secondary | ICD-10-CM

## 2011-11-10 LAB — POCT INR: INR: 1.9

## 2011-12-05 ENCOUNTER — Other Ambulatory Visit: Payer: Self-pay | Admitting: General Practice

## 2011-12-05 ENCOUNTER — Ambulatory Visit (INDEPENDENT_AMBULATORY_CARE_PROVIDER_SITE_OTHER): Payer: Medicare Other | Admitting: General Practice

## 2011-12-05 DIAGNOSIS — I4892 Unspecified atrial flutter: Secondary | ICD-10-CM

## 2011-12-05 DIAGNOSIS — Z7901 Long term (current) use of anticoagulants: Secondary | ICD-10-CM

## 2011-12-05 MED ORDER — WARFARIN SODIUM 5 MG PO TABS: ORAL_TABLET | ORAL | Status: DC

## 2011-12-17 ENCOUNTER — Other Ambulatory Visit: Payer: Self-pay

## 2011-12-17 MED ORDER — TAMSULOSIN HCL 0.4 MG PO CAPS: 0.4000 mg | ORAL_CAPSULE | Freq: Every day | ORAL | Status: DC

## 2011-12-30 ENCOUNTER — Ambulatory Visit (INDEPENDENT_AMBULATORY_CARE_PROVIDER_SITE_OTHER): Payer: Medicare Other | Admitting: General Practice

## 2011-12-30 DIAGNOSIS — I4892 Unspecified atrial flutter: Secondary | ICD-10-CM

## 2011-12-30 DIAGNOSIS — Z7901 Long term (current) use of anticoagulants: Secondary | ICD-10-CM

## 2011-12-30 LAB — POCT INR: INR: 1.9

## 2012-01-20 ENCOUNTER — Ambulatory Visit (INDEPENDENT_AMBULATORY_CARE_PROVIDER_SITE_OTHER): Payer: Medicare Other | Admitting: General Practice

## 2012-01-20 DIAGNOSIS — I4892 Unspecified atrial flutter: Secondary | ICD-10-CM

## 2012-01-20 DIAGNOSIS — Z7901 Long term (current) use of anticoagulants: Secondary | ICD-10-CM

## 2012-01-20 LAB — POCT INR: INR: 2.6

## 2012-02-17 ENCOUNTER — Ambulatory Visit (INDEPENDENT_AMBULATORY_CARE_PROVIDER_SITE_OTHER): Payer: Medicare Other | Admitting: General Practice

## 2012-02-17 DIAGNOSIS — Z7901 Long term (current) use of anticoagulants: Secondary | ICD-10-CM

## 2012-02-17 DIAGNOSIS — I4892 Unspecified atrial flutter: Secondary | ICD-10-CM

## 2012-03-11 ENCOUNTER — Encounter: Payer: Self-pay | Admitting: Internal Medicine

## 2012-03-11 ENCOUNTER — Ambulatory Visit (INDEPENDENT_AMBULATORY_CARE_PROVIDER_SITE_OTHER): Payer: Medicare Other | Admitting: General Practice

## 2012-03-11 ENCOUNTER — Ambulatory Visit (INDEPENDENT_AMBULATORY_CARE_PROVIDER_SITE_OTHER): Payer: Medicare Other | Admitting: Internal Medicine

## 2012-03-11 VITALS — BP 130/70 | HR 79 | Ht 71.0 in | Wt 176.8 lb

## 2012-03-11 DIAGNOSIS — I4892 Unspecified atrial flutter: Secondary | ICD-10-CM

## 2012-03-11 DIAGNOSIS — I4891 Unspecified atrial fibrillation: Secondary | ICD-10-CM

## 2012-03-11 DIAGNOSIS — Z7901 Long term (current) use of anticoagulants: Secondary | ICD-10-CM

## 2012-03-11 LAB — CBC WITH DIFFERENTIAL/PLATELET
Basophils Absolute: 0 10*3/uL (ref 0.0–0.1)
Eosinophils Absolute: 0 10*3/uL (ref 0.0–0.7)
Lymphocytes Relative: 50 % — ABNORMAL HIGH (ref 12.0–46.0)
MCHC: 34 g/dL (ref 30.0–36.0)
MCV: 93.8 fl (ref 78.0–100.0)
Monocytes Absolute: 0.7 10*3/uL (ref 0.1–1.0)
Neutro Abs: 2.3 10*3/uL (ref 1.4–7.7)
Neutrophils Relative %: 38.3 % — ABNORMAL LOW (ref 43.0–77.0)
RDW: 13.5 % (ref 11.5–14.6)

## 2012-03-11 LAB — BASIC METABOLIC PANEL
Chloride: 100 mEq/L (ref 96–112)
Creatinine, Ser: 0.7 mg/dL (ref 0.4–1.5)
GFR: 111.06 mL/min (ref >60.00)

## 2012-03-11 MED ORDER — DABIGATRAN ETEXILATE MESYLATE 150 MG PO CAPS: 150.0000 mg | ORAL_CAPSULE | Freq: Two times a day (BID) | ORAL | Status: DC

## 2012-03-11 NOTE — Assessment & Plan Note (Signed)
The patient has a history of atrial fibrillation. He is here to discuss stopping anticoagulation. I went back to review the notes from 2010 were multiple electrophysiologist felt that he had atrial fibrillation and in fact he went to the LAB was found to have atrial fibrillation and atrial flutter. I have reviewed with him the results of the Affirm Trial which identified the highest risk group as most people who had a diagnosis of atrial fibrillation and Coumadin anticoagulation was discontinued. We also discussed the NOACs. He would like to begin on Pradaxa. We will check his renal function.

## 2012-03-11 NOTE — Patient Instructions (Addendum)
Your physician recommends that you have lab work today: bmp/cbc  Your physician has recommended you make the following change in your medication:  1) Stop warfarin 2) In 5 days, start Pradaxa 150 mg one capsule by mouth twice daily.  Your physician wants you to follow-up in: 6 months with Dr. Graciela Husbands. You will receive a reminder letter in the mail two months in advance. If you don't receive a letter, please call our office to schedule the follow-up appointment.

## 2012-03-11 NOTE — Progress Notes (Signed)
Patient Care Team: Corwin Levins, MD as PCP - General   HPI  Jonathan Orozco is a 79 y.o. male Seen in followup for right ventricular outflow tract ectopy as well as atrial flutters. He underwent 3-D mapping which failed to produce stable tachyarrhythmia is in no further ablation was undertaken. He was last seen 2012  He is here to discuss coming off anticoagulation  Reviewed the above history  He has had no further episodes of which he is aware   Past Medical History  Diagnosis Date  . MELANOMA, MALIGNANT, SKIN NOS 09/29/2006  . HYPERLIPIDEMIA 09/29/2006  . ERECTILE DYSFUNCTION 09/29/2006  . 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 ABUSE, ALCOHOL, IN REMISSION 04/08/2007  . Other specified forms of hearing loss 08/10/2009  . HYPERTENSION 09/29/2006  . VENTRICULAR TACHYCARDIA 09/29/2006  . SINUSITIS- ACUTE-NOS 06/23/2007  . ALLERGIC RHINITIS 09/29/2006  . BENIGN PROSTATIC HYPERTROPHY 09/29/2006  . OSTEOARTHROSIS NOS, OTHER [facility] SITE 09/29/2006  . BACK PAIN 09/12/2008  . LEG PAIN, RIGHT 10/02/2008  . VERTIGO 04/08/2007  . COLONIC POLYPS, HX OF 06/23/2007  . CHEST PAIN 07/04/2008  . FATIGUE 11/16/2009  . VIRAL GASTROENTERITIS 01/29/2010  . GLUCOSE INTOLERANCE 03/19/2010  . Atrial flutter 03/28/2010  . Long term (current) use of anticoagulants 04/26/2010    Past Surgical History  Procedure Date  . Left rotato cuff   . Joint replacement     Right knee  . Tonsillectomy   . Left knee arthroscopy     Current Outpatient Prescriptions  Medication Sig Dispense Refill  . diltiazem (MATZIM LA) 360 MG 24 hr tablet Take 1 tablet (360 mg total) by mouth daily.  90 tablet  3  . fish oil-omega-3 fatty acids 1000 MG capsule Take 1 g by mouth daily.        [name] HYDROcodone-acetaminophen (NORCO) 5-325 MG per tablet Take 1 tablet by mouth every 6 (six) hours as needed.  90 tablet  1  . Multiple Vitamin (MULTIVITAMIN) tablet Take 1 tablet by mouth daily.        . Tamsulosin HCl (FLOMAX) 0.4 MG CAPS Take 1  capsule (0.4 mg total) by mouth daily.  90 capsule  1  . warfarin (COUMADIN) 5 MG tablet Take as directed by Anticoagulation clinic  105 tablet  1  . atorvastatin (LIPITOR) 10 MG tablet Take 1 tablet (10 mg total) by mouth daily.  90 tablet  3    Allergies  Allergen Reactions  . Ace Inhibitors     REACTION: cough  . Amiodarone Hcl     REACTION: intolerance    Review of Systems negative except from HPI and PMH  Physical Exam BP 130/70  Pulse 79  Ht 5\' 11"  (1.803 m)  Wt 176 lb 12.8 oz (80.196 kg)  BMI 24.66 kg/m2 Well developed and nourished in no acute distress HENT normal Neck supple with JVP-flat Clear Regular rate and rhythm, no murmurs or gallops Abd-soft with active BS No Clubbing cyanosis edema Skin-warm and dry A & Oriented  Grossly normal sensory and motor function  Skin Warm and Dry   ECG: Sinus Rhythm/86             Intervals  20/14/46  Axis -28 rbbb      Assessment and  Plan

## 2012-04-22 ENCOUNTER — Encounter (HOSPITAL_COMMUNITY): Payer: Self-pay | Admitting: Emergency Medicine

## 2012-04-22 ENCOUNTER — Emergency Department (HOSPITAL_COMMUNITY)
Admission: EM | Admit: 2012-04-22 | Discharge: 2012-04-22 | Disposition: A | Discharge status: Home / Self Care | Payer: Medicare Other | Attending: Emergency Medicine | Admitting: Emergency Medicine

## 2012-04-22 DIAGNOSIS — I10 Essential (primary) hypertension: Secondary | ICD-10-CM | POA: Insufficient documentation

## 2012-04-22 DIAGNOSIS — Z8739 Personal history of other diseases of the musculoskeletal system and connective tissue: Secondary | ICD-10-CM | POA: Insufficient documentation

## 2012-04-22 DIAGNOSIS — Z8619 Personal history of other infectious and parasitic diseases: Secondary | ICD-10-CM | POA: Insufficient documentation

## 2012-04-22 DIAGNOSIS — Z85828 Personal history of other malignant neoplasm of skin: Secondary | ICD-10-CM | POA: Insufficient documentation

## 2012-04-22 DIAGNOSIS — Z7982 Long term (current) use of aspirin: Secondary | ICD-10-CM | POA: Insufficient documentation

## 2012-04-22 DIAGNOSIS — I4892 Unspecified atrial flutter: Secondary | ICD-10-CM | POA: Insufficient documentation

## 2012-04-22 DIAGNOSIS — N400 Enlarged prostate without lower urinary tract symptoms: Secondary | ICD-10-CM | POA: Insufficient documentation

## 2012-04-22 DIAGNOSIS — Z8639 Personal history of other endocrine, nutritional and metabolic disease: Secondary | ICD-10-CM | POA: Insufficient documentation

## 2012-04-22 DIAGNOSIS — Z860100 Personal history of colon polyps, unspecified: Secondary | ICD-10-CM | POA: Insufficient documentation

## 2012-04-22 DIAGNOSIS — Z87891 Personal history of nicotine dependence: Secondary | ICD-10-CM | POA: Insufficient documentation

## 2012-04-22 DIAGNOSIS — Z8669 Personal history of other diseases of the nervous system and sense organs: Secondary | ICD-10-CM | POA: Insufficient documentation

## 2012-04-22 DIAGNOSIS — Z8679 Personal history of other diseases of the circulatory system: Secondary | ICD-10-CM | POA: Insufficient documentation

## 2012-04-22 DIAGNOSIS — Z7901 Long term (current) use of anticoagulants: Secondary | ICD-10-CM | POA: Insufficient documentation

## 2012-04-22 DIAGNOSIS — Z862 Personal history of diseases of the blood and blood-forming organs and certain disorders involving the immune mechanism: Secondary | ICD-10-CM | POA: Insufficient documentation

## 2012-04-22 DIAGNOSIS — Z79899 Other long term (current) drug therapy: Secondary | ICD-10-CM | POA: Insufficient documentation

## 2012-04-22 DIAGNOSIS — Z8709 Personal history of other diseases of the respiratory system: Secondary | ICD-10-CM | POA: Insufficient documentation

## 2012-04-22 DIAGNOSIS — E785 Hyperlipidemia, unspecified: Secondary | ICD-10-CM | POA: Insufficient documentation

## 2012-04-22 DIAGNOSIS — Z8673 Personal history of transient ischemic attack (TIA), and cerebral infarction without residual deficits: Secondary | ICD-10-CM | POA: Insufficient documentation

## 2012-04-22 DIAGNOSIS — Z8659 Personal history of other mental and behavioral disorders: Secondary | ICD-10-CM | POA: Insufficient documentation

## 2012-04-22 LAB — POCT I-STAT, CHEM 8
Calcium, Ion: 1.1 mmol/L — ABNORMAL LOW (ref 1.13–1.30)
Chloride: 103 mEq/L (ref 96–112)
Glucose, Bld: 117 mg/dL — ABNORMAL HIGH (ref 70–99)
HCT: 47 % (ref 39.0–52.0)
Hemoglobin: 16 g/dL (ref 13.0–17.0)
Potassium: 4 mEq/L (ref 3.5–5.1)

## 2012-04-22 LAB — CBC WITH DIFFERENTIAL/PLATELET
Basophils Relative: 0 % (ref 0–1)
Eosinophils Absolute: 0.1 10*3/uL (ref 0.0–0.7)
HCT: 46.2 % (ref 39.0–52.0)
Hemoglobin: 16 g/dL (ref 13.0–17.0)
Lymphs Abs: 3.4 10*3/uL (ref 0.7–4.0)
MCH: 31.9 pg (ref 26.0–34.0)
MCHC: 34.6 g/dL (ref 30.0–36.0)
MCV: 92.2 fL (ref 78.0–100.0)
Monocytes Absolute: 0.6 10*3/uL (ref 0.1–1.0)
Monocytes Relative: 9 % (ref 3–12)
RBC: 5.01 MIL/uL (ref 4.22–5.81)

## 2012-04-22 LAB — COMPREHENSIVE METABOLIC PANEL
ALT: 16 U/L (ref 0–53)
AST: 22 U/L (ref 0–37)
CO2: 25 mEq/L (ref 19–32)
Calcium: 9.4 mg/dL (ref 8.4–10.5)
Chloride: 99 mEq/L (ref 96–112)
Creatinine, Ser: 0.79 mg/dL (ref 0.50–1.35)
GFR calc Af Amer: >90 mL/min (ref >90)
GFR calc non Af Amer: 81 mL/min — ABNORMAL LOW (ref >90)
Glucose, Bld: 117 mg/dL — ABNORMAL HIGH (ref 70–99)
Total Bilirubin: 0.4 mg/dL (ref 0.3–1.2)

## 2012-04-22 LAB — POCT I-STAT TROPONIN I: Troponin i, poc: 0.01 ng/mL (ref 0.00–0.08)

## 2012-04-22 LAB — PROTIME-INR: INR: 1.16 (ref 0.00–1.49)

## 2012-04-22 MED ORDER — METOPROLOL SUCCINATE ER 50 MG PO TB24: 50.0000 mg | ORAL_TABLET | Freq: Every day | ORAL | Status: DC

## 2012-04-22 MED ORDER — LORAZEPAM 0.5 MG PO TABS
0.5000 mg | ORAL_TABLET | Freq: Once | ORAL | Status: AC
  Administered 2012-04-22: 0.5 mg via ORAL
  Filled 2012-04-22: qty 1

## 2012-04-22 MED ORDER — METOPROLOL TARTRATE 1 MG/ML IV SOLN: 5.0000 mg | Freq: Once | INTRAVENOUS | Status: AC

## 2012-04-22 MED ORDER — METOPROLOL SUCCINATE ER 50 MG PO TB24
50.0000 mg | ORAL_TABLET | Freq: Every day | ORAL | Status: DC
Start: 2012-04-22 — End: 2012-04-22
  Filled 2012-04-22: qty 1

## 2012-04-22 MED ORDER — METOPROLOL TARTRATE 1 MG/ML IV SOLN
5.0000 mg | Freq: Once | INTRAVENOUS | Status: AC
  Administered 2012-04-22: 5 mg via INTRAVENOUS
  Filled 2012-04-22: qty 5

## 2012-04-22 MED ORDER — SODIUM CHLORIDE 0.9 % IV BOLUS (SEPSIS)
500.0000 mL | Freq: Once | INTRAVENOUS | Status: AC
  Administered 2012-04-22: 500 mL via INTRAVENOUS

## 2012-04-22 MED ORDER — METOPROLOL TARTRATE 1 MG/ML IV SOLN
INTRAVENOUS | Status: AC
  Administered 2012-04-22: 5 mg via INTRAVENOUS
  Filled 2012-04-22: qty 5

## 2012-04-22 MED ORDER — SODIUM CHLORIDE 0.9 % IV BOLUS (SEPSIS)
250.0000 mL | Freq: Once | INTRAVENOUS | Status: AC
  Administered 2012-04-22: 250 mL via INTRAVENOUS

## 2012-04-22 NOTE — ED Notes (Signed)
Pt states that between 4-5pm yesterday pt states he felt nervous and he looked at his HR monitor and it read 150bpm.  So pt took hydrocodone and aspirin and he states he also tried to bear down to slow HR down. He woke up this morning and his BP monitor did match with his wrist HR monitor of 150bpm.  Pt states he has had a flutter before 3 yrs ago and was given Adenosine and states it fixed him.  Pt states he feels fine, denies SHob or chest pain.

## 2012-04-22 NOTE — ED Notes (Signed)
Pt states that he was taking coumadin and switched to pradaxa, but pt didn't want to take it after researching the side effects, so only taking aspirin daily. So pt hasn't had any anticoagulants since Feb 2014, pt states he did take a Pradaxa this morning at 8am.

## 2012-04-22 NOTE — ED Notes (Signed)
Pt requesting something to drink. EDP Wentz ok. Got telephone order to give pt bolus of normal saline.

## 2012-04-22 NOTE — ED Notes (Signed)
Dr Effie Shy and RN made aware of patient vitals.

## 2012-04-22 NOTE — ED Notes (Signed)
Made EDP Wentz aware of pt HR and PMH.

## 2012-04-22 NOTE — ED Provider Notes (Signed)
History     CSN: [id number]  Arrival date & time 04/22/12  0865   First MD Initiated Contact with Patient 04/22/12 [phone]      Chief Complaint  Patient presents with  . Palpitations    (Consider location/radiation/quality/duration/timing/severity/associated sxs/prior treatment) HPI Comments: Jonathan Orozco is a 79 y.o. Male who states that yesterday he checked his heart rate, because he was nervous, and found to be elevated at 140. He did not eat dinner last night, and this morning. He drank some coffee. He denies chest pain, shortness of breath, nausea, vomiting, weakness, or dizziness. He has been prescribed Pradaxa, but did not take it because of, what he thought, were medico-legal problems, with it. There are no known modifying factors.  Patient is a 79 y.o. male presenting with palpitations. The history is provided by the patient.  Palpitations     Past Medical History  Diagnosis Date  . MELANOMA, MALIGNANT, SKIN NOS 09/29/2006  . HYPERLIPIDEMIA 09/29/2006  . ERECTILE DYSFUNCTION 09/29/2006  . 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 ABUSE, ALCOHOL, IN REMISSION 04/08/2007  . Other specified forms of hearing loss 08/10/2009  . HYPERTENSION 09/29/2006  . VENTRICULAR TACHYCARDIA 09/29/2006  . SINUSITIS- ACUTE-NOS 06/23/2007  . ALLERGIC RHINITIS 09/29/2006  . BENIGN PROSTATIC HYPERTROPHY 09/29/2006  . OSTEOARTHROSIS NOS, OTHER [facility] SITE 09/29/2006  . BACK PAIN 09/12/2008  . LEG PAIN, RIGHT 10/02/2008  . VERTIGO 04/08/2007  . COLONIC POLYPS, HX OF 06/23/2007  . CHEST PAIN 07/04/2008  . FATIGUE 11/16/2009  . VIRAL GASTROENTERITIS 01/29/2010  . GLUCOSE INTOLERANCE 03/19/2010  . Atrial flutter 03/28/2010  . Long term (current) use of anticoagulants 04/26/2010    Past Surgical History  Procedure Laterality Date  . Left rotato cuff    . Joint replacement      Right knee  . Tonsillectomy    . Left knee arthroscopy      Family History  Problem Relation Age of Onset  . Diabetes Brother      History  Substance Use Topics  . Smoking status: Former Games developer  . Smokeless tobacco: Not on file     Comment: Quit smoking and drinking 20 years ago  . Alcohol Use: No      Review of Systems  Cardiovascular: Positive for palpitations.  All other systems reviewed and are negative.    Allergies  Ace inhibitors and Amiodarone hcl  Home Medications   Current Outpatient Rx  Name  Route  Sig  Dispense  Refill  . aspirin 81 MG tablet   Oral   Take 162 mg by mouth daily.         . dabigatran (PRADAXA) 150 MG CAPS   Oral   Take 1 capsule (150 mg total) by mouth every 12 (twelve) hours.   180 capsule   3   . diltiazem (MATZIM LA) 360 MG 24 hr tablet   Oral   Take 1 tablet (360 mg total) by mouth daily.   90 tablet   3   . fish oil-omega-3 fatty acids 1000 MG capsule   Oral   Take 1 g by mouth daily.           [name] HYDROcodone-acetaminophen (NORCO) 5-325 MG per tablet   Oral   Take 1 tablet by mouth every 6 (six) hours as needed.   90 tablet   1   . Multiple Vitamin (MULTIVITAMIN) tablet   Oral   Take 1 tablet by mouth daily.           [name]  Tamsulosin HCl (FLOMAX) 0.4 MG CAPS   Oral   Take 1 capsule (0.4 mg total) by mouth daily.   90 capsule   1   . metoprolol succinate (TOPROL-XL) 50 MG 24 hr tablet   Oral   Take 1 tablet (50 mg total) by mouth daily. Take with or immediately following a meal.   30 tablet   0     BP 110/62  Pulse 89  Temp(Src) 98.5 F (36.9 C) (Oral)  Resp 15  SpO2 97%  Physical Exam  Nursing note and vitals reviewed. Constitutional: He is oriented to person, place, and time. He appears well-developed and well-nourished.  HENT:  Head: Normocephalic and atraumatic.  Right Ear: External ear normal.  Left Ear: External ear normal.  Eyes: Conjunctivae and EOM are normal. Pupils are equal, round, and reactive to light.  Neck: Normal range of motion and phonation normal. Neck supple.  Cardiovascular: Normal rate, normal heart  sounds and intact distal pulses.   Tachycardic  Pulmonary/Chest: Effort normal and breath sounds normal. He exhibits no bony tenderness.  Abdominal: Soft. Normal appearance. There is no tenderness.  Musculoskeletal: Normal range of motion.  Neurological: He is alert and oriented to person, place, and time. He has normal strength. No cranial nerve deficit or sensory deficit. He exhibits normal muscle tone. Coordination normal.  Skin: Skin is warm, dry and intact.  Psychiatric: He has a normal mood and affect. His behavior is normal. Judgment and thought content normal.    ED Course  Procedures (including critical care time)    Date: 11/21/2011  Rate: 140  Rhythm: atrial flutter - 2:1 block  QRS Axis: right  PR and QT Intervals: Normal QT  ST/T Wave abnormalities: normal  PR and QRS Conduction Disutrbances:Normal QT, right bundle branch block  Narrative Interpretation:   Old EKG Reviewed: changes noted- compared to 05/31/08- A. Flutter,  nw, and the rate is faster    ED treatment: IV Lopressor and IV saline bolus  Reevaluation: 12:10- heart rate improved to 95-100, it is irregular. He is tolerating oral fluids, and is hungry. Lunch ordered.    Date: 11/21/2011  Rate: 107  Rhythm: atrial flutter and Variable block  QRS Axis: normal  PR and QT Intervals: normal  ST/T Wave abnormalities: normal  PR and QRS Conduction Disutrbances:right bundle branch block, normal QT  Narrative Interpretation:   Old EKG Reviewed: changes noted- rate is slower compared with earlier today   CRITICAL CARE Performed by: Mancel Bale L   Total critical care time: 40 minutes  Critical care time was exclusive of separately billable procedures and treating other patients.  Critical care was necessary to treat or prevent imminent or life-threatening deterioration.  Critical care was time spent personally by me on the following activities: development of treatment plan with patient and/or  surrogate as well as nursing, discussions with consultants, evaluation of patient's response to treatment, examination of patient, obtaining history from patient or surrogate, ordering and performing treatments and interventions, ordering and review of laboratory studies, ordering and review of radiographic studies, pulse oximetry and re-evaluation of patient's condition.  Labs Reviewed  CBC WITH DIFFERENTIAL - Abnormal; Notable for the following:    Neutrophils Relative 37 (*)    Lymphocytes Relative 52 (*)    All other components within normal limits  COMPREHENSIVE METABOLIC PANEL - Abnormal; Notable for the following:    Sodium 134 (*)    Glucose, Bld 117 (*)    Alkaline Phosphatase 127 (*)  GFR calc non Af Amer 81 (*)    All other components within normal limits  POCT I-STAT, CHEM 8 - Abnormal; Notable for the following:    Glucose, Bld 117 (*)    Calcium, Ion 1.10 (*)    All other components within normal limits  PROTIME-INR  POCT I-STAT TROPONIN I   Nursing Notes Reviewed/ Care Coordinated Applicable Imaging Reviewed Interpretation of Laboratory Data incorporated into ED treatment   1. Atrial flutter       MDM  Atrial flutter, with history of same. He is likely mildly dehydrated as well. He is improved with a single dose of Lopressor, and IV fluids. Pt had recurrence of tachycardia and required a second dose of IV Lopressor. Also started on oral Metoprolol. Doubt metabolic instability, serious bacterial infection or impending vascular collapse; the patient is stable for discharge.    Plan: Home Medications- Metoprolol XL; Home Treatments- rest; Recommended follow up- EP Cards, 5-6 days        Flint Melter, MD 04/22/12 [id number]

## 2012-04-23 ENCOUNTER — Encounter: Payer: Self-pay | Admitting: Cardiology

## 2012-04-23 ENCOUNTER — Ambulatory Visit (INDEPENDENT_AMBULATORY_CARE_PROVIDER_SITE_OTHER): Payer: Medicare Other | Admitting: Cardiology

## 2012-04-23 VITALS — BP 114/86 | HR 144 | Ht 72.0 in | Wt 174.0 lb

## 2012-04-23 DIAGNOSIS — I483 Typical atrial flutter: Secondary | ICD-10-CM | POA: Insufficient documentation

## 2012-04-23 DIAGNOSIS — I4892 Unspecified atrial flutter: Secondary | ICD-10-CM

## 2012-04-23 NOTE — Patient Instructions (Addendum)
Your physician has recommended you make the following change in your medication: INCREASE Metoprolol Succinate to 100mg  once a day--if your heart rate does not slow down by tomorrow then you will need to proceed to the ER for further treatment, STOP ASPIRIN  Your physician recommends that you schedule a follow-up appointment: next week with Dr Graciela Husbands

## 2012-04-23 NOTE — Assessment & Plan Note (Addendum)
See my note.  EP study was 2010 under assessment and plan

## 2012-04-23 NOTE — Progress Notes (Signed)
HPI:  This patient is in to be seen today. He presented to the emergency room yesterday with a rate of 150. His heart rate subsequently came down, and emergency room physician was told by the consulting cardiologist to send him to the office the next day. He is here now with a heart rate of 150. He feels okay, without any shortness of breath or significant chest pain.  He is not having unstable symptoms at this point.  It is hard to reconstruct his cardiac history at this point in time.  He apparently had an attempt at an ablation, but it was too "complicated" to accomplish.  I cannot find this procedure attempt in EPIC.    Current Outpatient Prescriptions  Medication Sig Dispense Refill  . aspirin 81 MG tablet Take 162 mg by mouth daily.      . dabigatran (PRADAXA) 150 MG CAPS Take 1 capsule (150 mg total) by mouth every 12 (twelve) hours.  180 capsule  3  . diltiazem (MATZIM LA) 360 MG 24 hr tablet Take 1 tablet (360 mg total) by mouth daily.  90 tablet  3  . fish oil-omega-3 fatty acids 1000 MG capsule Take 1 g by mouth daily.        [name] HYDROcodone-acetaminophen (NORCO) 5-325 MG per tablet Take 1 tablet by mouth every 6 (six) hours as needed.  90 tablet  1  . metoprolol succinate (TOPROL-XL) 50 MG 24 hr tablet Take 1 tablet (50 mg total) by mouth daily. Take with or immediately following a meal.  30 tablet  0  . Multiple Vitamin (MULTIVITAMIN) tablet Take 1 tablet by mouth daily.        . Tamsulosin HCl (FLOMAX) 0.4 MG CAPS Take 1 capsule (0.4 mg total) by mouth daily.  90 capsule  1   No current facility-administered medications for this visit.    Allergies  Allergen Reactions  . Ace Inhibitors     REACTION: cough  . Amiodarone Hcl     REACTION: intolerance    Past Medical History  Diagnosis Date  . MELANOMA, MALIGNANT, SKIN NOS 09/29/2006  . HYPERLIPIDEMIA 09/29/2006  . ERECTILE DYSFUNCTION 09/29/2006  . 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 ABUSE, ALCOHOL, IN REMISSION  04/08/2007  . Other specified forms of hearing loss 08/10/2009  . HYPERTENSION 09/29/2006  . VENTRICULAR TACHYCARDIA 09/29/2006  . SINUSITIS- ACUTE-NOS 06/23/2007  . ALLERGIC RHINITIS 09/29/2006  . BENIGN PROSTATIC HYPERTROPHY 09/29/2006  . OSTEOARTHROSIS NOS, OTHER [facility] SITE 09/29/2006  . BACK PAIN 09/12/2008  . LEG PAIN, RIGHT 10/02/2008  . VERTIGO 04/08/2007  . COLONIC POLYPS, HX OF 06/23/2007  . CHEST PAIN 07/04/2008  . FATIGUE 11/16/2009  . VIRAL GASTROENTERITIS 01/29/2010  . GLUCOSE INTOLERANCE 03/19/2010  . Atrial flutter 03/28/2010  . Long term (current) use of anticoagulants 04/26/2010    Past Surgical History  Procedure Laterality Date  . Left rotato cuff    . Joint replacement      Right knee  . Tonsillectomy    . Left knee arthroscopy      Family History  Problem Relation Age of Onset  . Diabetes Brother     History   Social History  . Marital Status: Married    Spouse Name: N/A    Number of Children: N/A  . Years of Education: N/A   Occupational History  . Not on file.   Social History Main Topics  . Smoking status: Former Games developer  . Smokeless tobacco: Not on file  Comment: Quit smoking and drinking 20 years ago  . Alcohol Use: No  . Drug Use: No  . Sexually Active:    Other Topics Concern  . Not on file   Social History Narrative   Married, 3 children. 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 He is retired from Airline pilot.     ROS: Please see the HPI.  All other systems reviewed and negative.  PHYSICAL EXAM:  BP 114/86  Pulse 144  Ht 6' (1.829 m)  Wt 174 lb (78.926 kg)  BMI 23.59 kg/m2  SpO2 97%  General: Thin older gentleman in absolutely no distress.  Smiling in the office.   Head:  Normocephalic and atraumatic. Neck: no JVD Lungs: Clear to auscultation and percussion. Heart: very rapid rate.   Abdomen:  Normal bowel sounds; soft; non tender; no organomegaly  Pulses: Pulses normal in all 4 extremities. Extremities: No clubbing or  cyanosis. No edema. Neurologic: Alert and oriented x 3.  EKG:  ?atrial flutter with RVR, rate 144  ASSESSMENT AND PLAN: 1.  Atrial flutter and afib  /  Also his of RVOT VT  --  I called and spoke with Dr. Johney Frame in detail. We were able to find his previous electrophysiologic report. The patient had an extensive mapping in the past, and had multiple circuits and therefore ablation was not carried out. The symptoms started about 2 nights ago. We discussed the various options. As the patient is asymptomatic, Dr. Johney Frame suggested increasing the metoprolol dose to 100 mg a day to see if it comes down.  He thought it would be best for [place] to have him see Dr. Graciela Husbands on Tuesday the patient is tolerating this extremely well. If his heart rate does not improve, we will then have him return potentially to the emergency room tomorrow and be admitted for additional therapy. Of note, the patient has not tolerated amiodarone in the past according to him, although I am unable to find this in Epic. Stressed all the options with the patient, and he is comfortable with this plan. If he has any problems at all he is to come to the emergency room  Total evaluation time 45 minutes/20 minutes face to face.

## 2012-04-24 ENCOUNTER — Emergency Department (HOSPITAL_COMMUNITY): Payer: Medicare Other

## 2012-04-24 ENCOUNTER — Encounter (HOSPITAL_COMMUNITY): Payer: Self-pay | Admitting: *Deleted

## 2012-04-24 ENCOUNTER — Inpatient Hospital Stay (HOSPITAL_COMMUNITY)
Admission: EM | Admit: 2012-04-24 | Discharge: 2012-04-27 | DRG: 310 | Disposition: A | Discharge status: Home / Self Care | Payer: Medicare Other | Attending: Cardiology | Admitting: Cardiology

## 2012-04-24 DIAGNOSIS — Z85820 Personal history of malignant melanoma of skin: Secondary | ICD-10-CM

## 2012-04-24 DIAGNOSIS — I4892 Unspecified atrial flutter: Principal | ICD-10-CM

## 2012-04-24 DIAGNOSIS — E7800 Pure hypercholesterolemia, unspecified: Secondary | ICD-10-CM

## 2012-04-24 DIAGNOSIS — Z7901 Long term (current) use of anticoagulants: Secondary | ICD-10-CM

## 2012-04-24 DIAGNOSIS — I483 Typical atrial flutter: Secondary | ICD-10-CM | POA: Diagnosis present

## 2012-04-24 DIAGNOSIS — N400 Enlarged prostate without lower urinary tract symptoms: Secondary | ICD-10-CM | POA: Diagnosis present

## 2012-04-24 DIAGNOSIS — E785 Hyperlipidemia, unspecified: Secondary | ICD-10-CM | POA: Diagnosis present

## 2012-04-24 DIAGNOSIS — I10 Essential (primary) hypertension: Secondary | ICD-10-CM | POA: Diagnosis present

## 2012-04-24 DIAGNOSIS — M1990 Unspecified osteoarthritis, unspecified site: Secondary | ICD-10-CM | POA: Diagnosis present

## 2012-04-24 DIAGNOSIS — Z96659 Presence of unspecified artificial knee joint: Secondary | ICD-10-CM

## 2012-04-24 DIAGNOSIS — I4510 Unspecified right bundle-branch block: Secondary | ICD-10-CM | POA: Diagnosis present

## 2012-04-24 DIAGNOSIS — Z87891 Personal history of nicotine dependence: Secondary | ICD-10-CM

## 2012-04-24 LAB — CBC WITH DIFFERENTIAL/PLATELET
Basophils Absolute: 0 10*3/uL (ref 0.0–0.1)
Basophils Relative: 0 % (ref 0–1)
Eosinophils Absolute: 0.1 10*3/uL (ref 0.0–0.7)
Eosinophils Relative: 1 % (ref 0–5)
MCH: 31.6 pg (ref 26.0–34.0)
MCV: 92.7 fL (ref 78.0–100.0)
Neutrophils Relative %: 46 % (ref 43–77)
Platelets: 266 10*3/uL (ref 150–400)
RDW: 12.7 % (ref 11.5–15.5)

## 2012-04-24 LAB — POCT I-STAT TROPONIN I: Troponin i, poc: 0.02 ng/mL (ref 0.00–0.08)

## 2012-04-24 LAB — BASIC METABOLIC PANEL
Calcium: 9.1 mg/dL (ref 8.4–10.5)
GFR calc Af Amer: >90 mL/min (ref >90)
GFR calc non Af Amer: 83 mL/min — ABNORMAL LOW (ref >90)
Glucose, Bld: 114 mg/dL — ABNORMAL HIGH (ref 70–99)
Potassium: 5.4 mEq/L — ABNORMAL HIGH (ref 3.5–5.1)
Sodium: 133 mEq/L — ABNORMAL LOW (ref 135–145)

## 2012-04-24 MED ORDER — LORAZEPAM 2 MG/ML IJ SOLN
0.5000 mg | Freq: Once | INTRAMUSCULAR | Status: AC
  Administered 2012-04-24: 0.5 mg via INTRAVENOUS
  Filled 2012-04-24: qty 1

## 2012-04-24 MED ORDER — DILTIAZEM HCL 100 MG IV SOLR
5.0000 mg/h | INTRAVENOUS | Status: DC
  Filled 2012-04-24: qty 100

## 2012-04-24 MED ORDER — LORAZEPAM 0.5 MG PO TABS: 0.5000 mg | ORAL_TABLET | Freq: Once | ORAL | Status: DC

## 2012-04-24 MED ORDER — TAMSULOSIN HCL 0.4 MG PO CAPS
0.4000 mg | ORAL_CAPSULE | Freq: Every day | ORAL | Status: DC
  Administered 2012-04-24 – 2012-04-27 (×4): 0.4 mg via ORAL
  Filled 2012-04-24 (×4): qty 1

## 2012-04-24 MED ORDER — DABIGATRAN ETEXILATE MESYLATE 150 MG PO CAPS
150.0000 mg | ORAL_CAPSULE | Freq: Two times a day (BID) | ORAL | Status: DC
  Administered 2012-04-24 – 2012-04-27 (×6): 150 mg via ORAL
  Filled 2012-04-24 (×8): qty 1

## 2012-04-24 MED ORDER — HYDROCODONE-ACETAMINOPHEN 5-325 MG PO TABS: 1.0000 | ORAL_TABLET | Freq: Four times a day (QID) | ORAL | Status: DC | PRN

## 2012-04-24 MED ORDER — METOPROLOL SUCCINATE ER 50 MG PO TB24
150.0000 mg | ORAL_TABLET | Freq: Every day | ORAL | Status: DC
  Administered 2012-04-25 – 2012-04-26 (×2): 150 mg via ORAL
  Filled 2012-04-24 (×3): qty 1

## 2012-04-24 MED ORDER — DILTIAZEM HCL 25 MG/5ML IV SOLN
5.0000 mg | Freq: Once | INTRAVENOUS | Status: DC
  Filled 2012-04-24: qty 5

## 2012-04-24 MED ORDER — DILTIAZEM HCL ER COATED BEADS 360 MG PO TB24
360.0000 mg | ORAL_TABLET | Freq: Every day | ORAL | Status: DC
  Filled 2012-04-24: qty 1

## 2012-04-24 MED ORDER — ONDANSETRON HCL 4 MG/2ML IJ SOLN: 4.0000 mg | Freq: Four times a day (QID) | INTRAMUSCULAR | Status: DC | PRN

## 2012-04-24 MED ORDER — SODIUM CHLORIDE 0.9 % IV SOLN
Freq: Once | INTRAVENOUS | Status: AC
  Administered 2012-04-24: 17:00:00 via INTRAVENOUS

## 2012-04-24 MED ORDER — ACETAMINOPHEN 325 MG PO TABS: 650.0000 mg | ORAL_TABLET | ORAL | Status: DC | PRN

## 2012-04-24 MED ORDER — DILTIAZEM HCL ER COATED BEADS 360 MG PO CP24
360.0000 mg | ORAL_CAPSULE | Freq: Every day | ORAL | Status: DC
  Administered 2012-04-25: 360 mg via ORAL
  Filled 2012-04-24 (×2): qty 1

## 2012-04-24 MED ORDER — METOPROLOL SUCCINATE ER 50 MG PO TB24: 50.0000 mg | ORAL_TABLET | Freq: Every day | ORAL | Status: DC

## 2012-04-24 NOTE — ED Notes (Signed)
Report given to nurse on 3 w

## 2012-04-24 NOTE — ED Notes (Signed)
pts heart rate has decreased, vital signs discussed with PA, PA reported to not give/start cardizem.

## 2012-04-24 NOTE — ED Notes (Signed)
md at bedside

## 2012-04-24 NOTE — ED Provider Notes (Signed)
History     CSN: [id number]  Arrival date & time 04/24/12  1206   First MD Initiated Contact with Patient 04/24/12 1215      Chief Complaint  Patient presents with  . Atrial Flutter  . HR 144     (Consider location/radiation/quality/duration/timing/severity/associated sxs/prior treatment) HPI Comments: Patient is an 79 year old male with a past medical history of atrial flutter who presents to the ED with a 3 day history of palpitations. Symptoms started suddenly and remained constant since the onset. Patient reports being in the ED 2 days ago for the same symptoms. He follow up with [facility] yesterday and was instructed to increase his Diltiazem and Metoprolol dose and if he was not better by noon today, he should come to the ED. Patient reports his HR at noon was 144 and patient was still feeling palpitations so he came to the ED. No aggravating/alleviating factors. No associated symptoms. Patient denies chest pain, lightheadedness, SOB, NVD.    Past Medical History  Diagnosis Date  . MELANOMA, MALIGNANT, SKIN NOS 09/29/2006  . HYPERLIPIDEMIA 09/29/2006  . ERECTILE DYSFUNCTION 09/29/2006  . 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 ABUSE, ALCOHOL, IN REMISSION 04/08/2007  . Other specified forms of hearing loss 08/10/2009  . HYPERTENSION 09/29/2006  . VENTRICULAR TACHYCARDIA 09/29/2006  . SINUSITIS- ACUTE-NOS 06/23/2007  . ALLERGIC RHINITIS 09/29/2006  . BENIGN PROSTATIC HYPERTROPHY 09/29/2006  . OSTEOARTHROSIS NOS, OTHER [facility] SITE 09/29/2006  . BACK PAIN 09/12/2008  . LEG PAIN, RIGHT 10/02/2008  . VERTIGO 04/08/2007  . COLONIC POLYPS, HX OF 06/23/2007  . CHEST PAIN 07/04/2008  . FATIGUE 11/16/2009  . VIRAL GASTROENTERITIS 01/29/2010  . GLUCOSE INTOLERANCE 03/19/2010  . Atrial flutter 03/28/2010  . Long term (current) use of anticoagulants 04/26/2010    Past Surgical History  Procedure Laterality Date  . Left rotato cuff    . Joint replacement      Right knee  .  Tonsillectomy    . Left knee arthroscopy      Family History  Problem Relation Age of Onset  . Diabetes Brother     History  Substance Use Topics  . Smoking status: Former Games developer  . Smokeless tobacco: Not on file     Comment: Quit smoking and drinking 20 years ago  . Alcohol Use: No      Review of Systems  Cardiovascular: Positive for palpitations.  All other systems reviewed and are negative.    Allergies  Ace inhibitors and Amiodarone hcl  Home Medications   Current Outpatient Rx  Name  Route  Sig  Dispense  Refill  . dabigatran (PRADAXA) 150 MG CAPS   Oral   Take 1 capsule (150 mg total) by mouth every 12 (twelve) hours.   180 capsule   3   . diltiazem (MATZIM LA) 360 MG 24 hr tablet   Oral   Take 1 tablet (360 mg total) by mouth daily.   90 tablet   3   . fish oil-omega-3 fatty acids 1000 MG capsule   Oral   Take 1 g by mouth daily.           [name] HYDROcodone-acetaminophen (NORCO) 5-325 MG per tablet   Oral   Take 1 tablet by mouth every 6 (six) hours as needed.   90 tablet   1   . metoprolol succinate (TOPROL-XL) 100 MG 24 hr tablet   Oral   Take 1 tablet (100 mg total) by mouth daily. Take with or  immediately following a meal.   1 tablet   0   . Multiple Vitamin (MULTIVITAMIN) tablet   Oral   Take 1 tablet by mouth daily.           . Tamsulosin HCl (FLOMAX) 0.4 MG CAPS   Oral   Take 1 capsule (0.4 mg total) by mouth daily.   90 capsule   1     BP 103/67  Pulse 58  Temp(Src) 98.1 F (36.7 C) (Oral)  Resp 18  SpO2 97%  Physical Exam  Nursing note and vitals reviewed. Constitutional: He is oriented to person, place, and time. He appears well-developed and well-nourished. No distress.  HENT:  Head: Normocephalic and atraumatic.  Eyes: Conjunctivae and EOM are normal.  Neck: Normal range of motion. Neck supple.  Cardiovascular: Exam reveals no gallop and no friction rub.   No murmur heard. Tachycardic, rhythm irregular   Pulmonary/Chest: Effort normal and breath sounds normal. He has no wheezes. He has no rales. He exhibits no tenderness.  Abdominal: Soft. There is no tenderness.  Musculoskeletal: Normal range of motion.  Neurological: He is alert and oriented to person, place, and time. Coordination normal.  Speech is goal-oriented. Moves limbs without ataxia.   Skin: Skin is warm and dry.  Psychiatric: He has a normal mood and affect. His behavior is normal.    ED Course  Procedures (including critical care time)  Labs Reviewed  BASIC METABOLIC PANEL - Abnormal; Notable for the following:    Sodium 133 (*)    Potassium 5.4 (*)    Glucose, Bld 114 (*)    GFR calc non Af Amer 83 (*)    All other components within normal limits  CBC WITH DIFFERENTIAL  POTASSIUM  POCT I-STAT TROPONIN I   Dg Chest 2 View  04/24/2012  *RADIOLOGY REPORT*  Clinical Data: Atrial flutter  CHEST - 2 VIEW  Comparison: 05/29/2011  Findings: Cardiomediastinal silhouette is stable.  No acute infiltrate or pleural effusion.  No pulmonary edema.  Stable degenerative changes thoracic spine.  IMPRESSION: No active disease.  No significant change.   Original Report Authenticated By: Natasha Mead, M.D.      1. Atrial flutter   2. Unspecified essential hypertension   3. Long term (current) use of anticoagulants   4. Pure hypercholesterolemia       MDM  1:02 PM Labs and chest xray pending. HR monitor shows A flutter. Heart currently 130's-140's. Patient feels "jittery" otherwise asymptomatic.  1:34 PM Patient will have diltiazem bolus of 5mg  and start on diltiazem drip.   3:18 PM Diltiazem held because patient's resting HR is now in 80's. We will hold Diltiazem bolus and drip for now. Potassium wil be rechecked. Patient's HR goes up to 120's-140's with standing. I will consult [facility].   3:44 PM Cardiology will see the patient.   [name], PA-C 04/24/12 1814

## 2012-04-24 NOTE — ED Notes (Signed)
Pt has hx of a flutter, was given adenosine 3 years ago, has been on diltiazim and has not had issues since until recently. Was seen in ED 3/20 for same complaint. pcp told pt yesterday to increase metoprolol from 50mg  to 100mg  and if heart rate wasn't better by noon today come back to ED. Pt denies pain.

## 2012-04-24 NOTE — ED Notes (Signed)
[facility] has been called 

## 2012-04-24 NOTE — ED Notes (Signed)
Pt wife Cuss teage [phone]

## 2012-04-24 NOTE — ED Notes (Signed)
Consult MD at bedside.

## 2012-04-24 NOTE — H&P (Signed)
Admit date: 04/24/2012  Primary Cardiologist  Dr. Riley Kill  CC: Aflutter with RVR  HPI: 79 year old male with paroxysmal atrial flutter status post attempted ablation in 2011, failed amiodarone do to photosensitivity he states, who has been aggravated by increased ventricular response atrial flutter. He does not feel chest pain/angina or shortness of breath but he usually has a sense of anxiety during periods of rapid ventricular response. When he lays on his left side he feels uneasy. On Thursday he felt anxious, checked his pulse and it was in the 140s he states. He came to the emergency department because of this. Metoprolol 50 mg extended release was begun. He then went to Dr. Rosalyn Charters office the next day, yesterday and this metoprolol was increased to 100 mg once a day. This morning was his first dose of the 100 mg.  Early this afternoon, his heart rate was once again in the 140/150 range and he came into emergency department for further evaluation.  Currently, he is in the 80s to 90s heart rate with atrial flutter underlying with variable conduction. His blood pressure is systolic 90-100. He is not dizzy, no syncope. No recent fevers, chills, orthopnea, rashes, strokelike symptoms.      PMH:   Past Medical History  Diagnosis Date  . MELANOMA, MALIGNANT, SKIN NOS 09/29/2006  . HYPERLIPIDEMIA 09/29/2006  . ERECTILE DYSFUNCTION 09/29/2006  . 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 ABUSE, ALCOHOL, IN REMISSION 04/08/2007  . Other specified forms of hearing loss 08/10/2009  . HYPERTENSION 09/29/2006  . VENTRICULAR TACHYCARDIA 09/29/2006  . SINUSITIS- ACUTE-NOS 06/23/2007  . ALLERGIC RHINITIS 09/29/2006  . BENIGN PROSTATIC HYPERTROPHY 09/29/2006  . OSTEOARTHROSIS NOS, OTHER [facility] SITE 09/29/2006  . BACK PAIN 09/12/2008  . LEG PAIN, RIGHT 10/02/2008  . VERTIGO 04/08/2007  . COLONIC POLYPS, HX OF 06/23/2007  . CHEST PAIN 07/04/2008  . FATIGUE 11/16/2009  . VIRAL GASTROENTERITIS 01/29/2010  .  GLUCOSE INTOLERANCE 03/19/2010  . Atrial flutter 03/28/2010  . Long term (current) use of anticoagulants 04/26/2010    PSH:   Past Surgical History  Procedure Laterality Date  . Left rotato cuff    . Joint replacement      Right knee  . Tonsillectomy    . Left knee arthroscopy     Allergies:  Ace inhibitors and Amiodarone hcl Prior to Admit Meds:   (Not in a hospital admission) Fam HX:    Family History  Problem Relation Age of Onset  . Diabetes Brother    Social HX:    History   Social History  . Marital Status: Married    Spouse Name: N/A    Number of Children: N/A  . Years of Education: N/A   Occupational History  . Not on file.   Social History Main Topics  . Smoking status: Former Games developer  . Smokeless tobacco: Not on file     Comment: Quit smoking and drinking 20 years ago  . Alcohol Use: No  . Drug Use: No  . Sexually Active:    Other Topics Concern  . 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      ROS:  All 11 ROS were addressed and are negative except what is stated in the HPI  Physical Exam: Blood pressure 91/59, pulse 68, temperature 98.1 F (36.7 C), temperature source Oral, resp. rate 14, SpO2 97.00%.    General:  Well developed, well nourished, in no acute distress Head: Eyes PERRLA, No xanthomas.   Normal cephalic and atramatic  Lungs:   Clear bilaterally to auscultation and percussion. Normal respiratory effort. No wheezes, no rales. Heart:  Irregularly irregular, normal rate  Pulses are 2+ & equal. No significant murmur   No carotid bruit. No JVD.  No abdominal bruits.  Abdomen: Bowel sounds are positive, abdomen soft and non-tender without masses. No hepatosplenomegaly. Msk:  Back normal, normal gait. Normal strength and tone for age. Extremities:   No clubbing, cyanosis or edema.  DP +1 Neuro: Alert and oriented X 3, non-focal, MAE x 4 GU:  Deferred Rectal: Deferred Psych:  Good affect, responds appropriately    Labs:   Lab Results  Component Value Date   WBC 6.4 04/24/2012   HGB 14.8 04/24/2012   HCT 43.4 04/24/2012   MCV 92.7 04/24/2012   PLT 266 04/24/2012    Recent Labs Lab 04/22/12 0913  04/24/12 1250 04/24/12 1523  NA 134*  < > 133*  --   K 4.1  < > 5.4* 4.5  CL 99  < > 99  --   CO2 25  --  23  --   BUN 12  < > 15  --   CREATININE 0.79  < > 0.76  --   CALCIUM 9.4  --  9.1  --   PROT 6.9  --   --   --   BILITOT 0.4  --   --   --   ALKPHOS 127*  --   --   --   ALT 16  --   --   --   AST 22  --   --   --   GLUCOSE 117*  < > 114*  --   < > = values in this interval not displayed. No results found for this basename: PTT   Lab Results  Component Value Date   INR 1.16 04/22/2012   INR 1.9 02/17/2012   INR 2.6 01/20/2012   PROTIME 18.8 07/10/2008   Lab Results  Component Value Date   CKTOTAL 108 03/07/2008   CKMB 2.3 03/07/2008   TROPONINI  Value: <0.01        NO INDICATION OF MYOCARDIAL INJURY. 03/07/2008      Radiology:  Dg Chest 2 View  04/24/2012  *RADIOLOGY REPORT*  Clinical Data: Atrial flutter  CHEST - 2 VIEW  Comparison: 05/29/2011  Findings: Cardiomediastinal silhouette is stable.  No acute infiltrate or pleural effusion.  No pulmonary edema.  Stable degenerative changes thoracic spine.  IMPRESSION: No active disease.  No significant change.   Original Report Authenticated By: Natasha Mead, M.D.    Personally viewed.   EKG:  Atrial flutter, right bundle branch block variable conduction heart rate 130. Currently heart rate in the 80s to 90s. Continued flutter. Personally viewed.  Echocardiogram-04/05/08-normal ejection fraction, mild MR.  ASSESSMENT/PLAN:   79 year old male with atrial flutter, variable conduction, rapid ventricular response, right bundle branch block, prior attempt at ablation in 2011, on chronic anticoagulation with Pradaxa.  1. Atrial flutter-I will transfer to [facility] hospital for  further cardiovascular management. Since he took 100 mg of metoprolol extended release this morning and still had breakthrough tachycardia, I will add an additional 50 mg of metoprolol extended release this evening. This will be a total of 150 mg. His blood pressure is somewhat soft however he is asymptomatic. We will continue with his current diltiazem dosing. Diltiazem has been 360  mg once a day, long-acting.  Apparently, he is tried antiarrhythmics in the past, and he specifically mentioned amiodarone and describes problems with photosensitivity and stated that this drug was a "poison".  I am unsure whether or not dronedarone has been utilized. Less efficacy but may be an alternative. Dr. Graciela Husbands will be able to comment.  For now, we'll try to establish improved rate control.  2. Chronic anticoagulation-continue with Pradaxa.  3. Hyperlipidemia-continue with low-dose atorvastatin.  Donato Schultz, MD  04/24/2012  4:50 PM

## 2012-04-25 DIAGNOSIS — I4892 Unspecified atrial flutter: Principal | ICD-10-CM

## 2012-04-25 MED ORDER — DILTIAZEM HCL 100 MG IV SOLR
5.0000 mg/h | INTRAVENOUS | Status: DC
  Administered 2012-04-25: 5 mg/h via INTRAVENOUS
  Administered 2012-04-26 – 2012-04-27 (×2): 10 mg/h via INTRAVENOUS
  Filled 2012-04-25 (×3): qty 100

## 2012-04-25 NOTE — ED Provider Notes (Signed)
  I performed a history and physical examination of Jonathan Orozco and discussed his management with Jonathan Orozco.  I agree with the history, physical, assessment, and plan of care, with the following exceptions: None  I was present for the following procedures: None Time Spent in Critical Care of the patient: None Time spent in discussions with the patient and family: 10  Jonathan Girtman Corlis Leak, Jonathan Orozco 04/25/12 [phone]

## 2012-04-25 NOTE — Care Management Utilization Note (Signed)
UR completed 

## 2012-04-25 NOTE — Progress Notes (Signed)
   Subjective:  Denies CP or dyspnea   Objective:  Filed Vitals:   04/24/12 1937 04/24/12 2001 04/24/12 2100 04/25/12 0500  BP: 100/54 93/57 120/73 100/61  Pulse: 76 73 70 65  Temp:   98.2 F (36.8 C) 98.2 F (36.8 C)  TempSrc:   Oral Oral  Resp: 18 15 18 18   Height:   6' (1.829 m)   Weight:   173 lb 11.6 oz (78.8 kg)   SpO2: 96% 96% 99% 98%    Intake/Output from previous day:  Intake/Output Summary (Last 24 hours) at 04/25/12 1610 Last data filed at 04/24/12 2100  Gross per 24 hour  Intake      0 ml  Output    825 ml  Net   -825 ml    Physical Exam: Physical exam: Well-developed well-nourished in no acute distress.  Skin is warm and dry.  HEENT is normal.  Neck is supple.  Chest is clear to auscultation with normal expansion.  Cardiovascular exam is tachycardic and irregular Abdominal exam nontender or distended. No masses palpated. Extremities show no edema. neuro grossly intact    Lab Results: Basic Metabolic Panel:  Recent Labs  96/04/54 1250 04/24/12 1523  NA 133*  --   K 5.4* 4.5  CL 99  --   CO2 23  --   GLUCOSE 114*  --   BUN 15  --   CREATININE 0.76  --   CALCIUM 9.1  --    CBC:  Recent Labs  04/24/12 1250  WBC 6.4  NEUTROABS 2.9  HGB 14.8  HCT 43.4  MCV 92.7  PLT 266     Assessment/Plan:  1 atrial flutter-the patient's rate remains elevated. Continue Cardizem and Toprol. Continue pradaxa. Patient states he has not had this rhythm disturbance in 3 years. Apparently ablation was unsuccessful in the past and he did not tolerate amiodarone. I would favor proceeding with TEE guided cardioversion tomorrow (patient only recently initiated pradaxa). If recurrent atrial arrhythmias and near future then he would require antiarrhythmic such as tikosyn. 2 hypertension-continue present blood pressure medications.  Olga Millers 04/25/2012, 9:25 AM

## 2012-04-26 ENCOUNTER — Encounter (HOSPITAL_COMMUNITY): Payer: Self-pay | Admitting: *Deleted

## 2012-04-26 NOTE — Progress Notes (Signed)
Pt HR sustaining in 120s-140s while lying in bed. BP 117/78. Pt asymptomatic. Per House, [place], ok to restart diltiazem drip at 5. NO BOLUS. Will continue to monitor patient closely. Levonne Spiller, RN

## 2012-04-26 NOTE — Progress Notes (Signed)
     Patient: GILBERTO STANFORTH Date of Encounter: 04/26/2012, 7:43 AM Admit date: 04/24/2012     Subjective  Mr. Raisanen has no new complaints this AM. He denies CP, SOB or palpitations.   Objective  Physical Exam: Vitals: BP 106/71  Pulse 68  Temp(Src) 98.4 F (36.9 C) (Oral)  Resp 20  Ht 6' (1.829 m)  Wt 169 lb 14.4 oz (77.066 kg)  BMI 23.04 kg/m2  SpO2 94% General: Well developed, well appearing 79 year old male in no acute distress. Neck: Supple. JVD not elevated. Lungs: Clear bilaterally to auscultation without wheezes, rales, or rhonchi. Breathing is unlabored. Heart: S1 S2 present without murmur, rub or gallop.  Abdomen: Soft, non-distended. Extremities: No clubbing or cyanosis. No edema.  Distal pedal pulses are 2+ and equal bilaterally. Neuro: Alert and oriented X 3. Moves all extremities spontaneously. No focal deficits.  Intake/Output:  Intake/Output Summary (Last 24 hours) at 04/26/12 0743 Last data filed at 04/26/12 0400  Gross per 24 hour  Intake      0 ml  Output   1875 ml  Net  -1875 ml    Inpatient Medications:  . dabigatran  150 mg Oral Q12H  . metoprolol succinate  150 mg Oral Daily  . tamsulosin  0.4 mg Oral Daily   . diltiazem (CARDIZEM) infusion Stopped (04/25/12 2248)    Labs:  Recent Labs  04/24/12 1250 04/24/12 1523  NA 133*  --   K 5.4* 4.5  CL 99  --   CO2 23  --   GLUCOSE 114*  --   BUN 15  --   CREATININE 0.76  --   CALCIUM 9.1  --     Recent Labs  04/24/12 1250  WBC 6.4  NEUTROABS 2.9  HGB 14.8  HCT 43.4  MCV 92.7  PLT 266    Radiology/Studies: Dg Chest 2 View  04/24/2012  *RADIOLOGY REPORT*  Clinical Data: Atrial flutter  CHEST - 2 VIEW  Comparison: 05/29/2011  Findings: Cardiomediastinal silhouette is stable.  No acute infiltrate or pleural effusion.  No pulmonary edema.  Stable degenerative changes thoracic spine.  IMPRESSION: No active disease.  No significant change.   Original Report Authenticated By: Natasha Mead,  M.D.     Telemetry: atrial flutter, currently rate controlled    Assessment and Plan  1. Atrial flutter 2. HTN Continue Cardizem and Toprol. Continue Pradaxa. Patient states he has not had this rhythm disturbance in 3 years. Apparently ablation was unsuccessful in the past and he did not tolerate amiodarone. Would favor proceeding with TEE guided DCCV (patient only recently initiated Pradaxa) and will try to get this scheduled today. If frequent recurrent atrial arrhythmias in future then he would likely require AAD therapy.   Dr. Graciela Husbands to see Signed, Rick Duff PA-C

## 2012-04-26 NOTE — Progress Notes (Signed)
Irregular rate and rhtym Started dabigitran last thurs 2/2 concerns that he read about on the internet   No availability for TEE>> will do in am

## 2012-04-27 ENCOUNTER — Telehealth: Payer: Self-pay | Admitting: Internal Medicine

## 2012-04-27 ENCOUNTER — Encounter (HOSPITAL_COMMUNITY): Payer: Self-pay | Admitting: Nurse Practitioner

## 2012-04-27 ENCOUNTER — Encounter (HOSPITAL_COMMUNITY)
Admission: EM | Disposition: A | Discharge status: Home / Self Care | Payer: Self-pay | Source: Home / Self Care | Attending: Cardiology

## 2012-04-27 ENCOUNTER — Ambulatory Visit: Payer: Medicare Other | Admitting: Internal Medicine

## 2012-04-27 SURGERY — ECHOCARDIOGRAM, TRANSESOPHAGEAL
Anesthesia: Moderate Sedation

## 2012-04-27 MED ORDER — METOPROLOL SUCCINATE ER 100 MG PO TB24
100.0000 mg | ORAL_TABLET | Freq: Every day | ORAL | Status: DC
  Administered 2012-04-27: 100 mg via ORAL
  Filled 2012-04-27: qty 1

## 2012-04-27 MED ORDER — LORAZEPAM 0.5 MG PO TABS: 0.5000 mg | ORAL_TABLET | Freq: Two times a day (BID) | ORAL | Status: DC | PRN

## 2012-04-27 NOTE — Telephone Encounter (Signed)
Done hardcopy to robin  

## 2012-04-27 NOTE — Progress Notes (Signed)
Central Monitoring notified that PT was having pauses longest 3.51. Strips placed in the chart. Pt was asymptomatic and found to be asleep. BP 98/66 hr 80. PA was on the unit and informed. Cardizem gtt was decreased and oncoming RN was informed in report. Ilean Skill LPN

## 2012-04-27 NOTE — Telephone Encounter (Signed)
Pt just came home from the hospital.  He is requesting Adavan for his nerves.  Cardiology told him to call his PCP for the RX. Pharmacy is [facility]. Call when ready.

## 2012-04-27 NOTE — Discharge Summary (Signed)
Patient ID: Jonathan Orozco,  MRN: [id number], DOB/AGE: [birth year] 79 y.o.  Admit date: 04/24/2012 Discharge date: 04/27/2012  Primary Care Provider: Oliver Barre Primary Cardiologist: Odessa Fleming, MD  Discharge Diagnoses Principal Problem:   Atrial flutter  *spontaneously converted this admission. Active Problems:   Long term (current) use of anticoagulants (pradaxa)   HYPERLIPIDEMIA   HYPERTENSION  Allergies Allergies  Allergen Reactions  . Ace Inhibitors     REACTION: cough  . Amiodarone Hcl     REACTION: intolerance   Procedures  None  History of Present Illness  79 year old male with history of atrial flutter status post prior electrophysiologic evaluation with extensive mapping that revealed multiple circuits which prevented ablation. His been medically managed and anticoagulated since. He recently presented to the emergency department secondary to recurrent palpitations and atrial flutter with rapid ventricular response. He was placed on beta blocker therapy, discharged , and advised to followup in cardiology office on March 21. In clinic on March 21, he remained tachycardic and his beta blocker dose was titrated. Arrangements for electrophysiology followup were made. Unfortunately, on March 22 patient noted marked elevation of heart rates into the 140s and 150s and decided to present to the ED for evaluation. He was relatively asymptomatic. He was placed on diltiazem infusion and home dose of beta blocker was continued. He was admitted for further evaluation.  Hospital Course  Patient remained in atrial fibrillation with relatively rapid rates over the weekend and arrangements were made for transesophageal echocardiogram and cardioversion for Monday, March 24. Unfortunately transesophageal echocardiogram could not be arranged and was thus deferred to March 25. Patient converted spontaneously to sinus rhythm overnight and IV diltiazem was discontinued. We plan to discharge him home  today in good condition on prior home dose of diltiazem as well as metoprolol. He will followup with electrophysiology in approximately 3 weeks.   Discharge Vitals Blood pressure 102/64, pulse 58, temperature 97.4 F (36.3 C), temperature source Oral, resp. rate 18, height 6' (1.829 m), weight 169 lb 14.4 oz (77.066 kg), SpO2 98.00%.  Filed Weights   04/24/12 2100 04/26/12 0400  Weight: 173 lb 11.6 oz (78.8 kg) 169 lb 14.4 oz (77.066 kg)   Labs  CBC Lab Results  Component Value Date   WBC 6.4 04/24/2012   HGB 14.8 04/24/2012   HCT 43.4 04/24/2012   MCV 92.7 04/24/2012   PLT 266 04/24/2012   Basic Metabolic Panel Lab Results  Component Value Date   CREATININE 0.76 04/24/2012   BUN 15 04/24/2012   NA 133* 04/24/2012   K 4.5 04/24/2012   CL 99 04/24/2012   CO2 23 04/24/2012   Disposition  Pt is being discharged home today in good condition.  Follow-up Plans & Appointments      Follow-up Information   Follow up with Sherryl Manges, MD On 05/19/2012. (9:15 AM)    Contact information:   [address] [place] [address] [phone]     Discharge Medications    Medication List    TAKE these medications       dabigatran 150 MG Caps  Commonly known as:  PRADAXA  Take 1 capsule (150 mg total) by mouth every 12 (twelve) hours.     diltiazem 360 MG 24 hr tablet  Commonly known as:  MATZIM LA  Take 1 tablet (360 mg total) by mouth daily.     fish oil-omega-3 fatty acids 1000 MG capsule  Take 1 g by mouth daily.  HYDROcodone-acetaminophen 5-325 MG per tablet  Commonly known as:  NORCO/VICODIN  Take 1 tablet by mouth every 6 (six) hours as needed.     metoprolol succinate 50 MG 24 hr tablet  Commonly known as:  TOPROL-XL  Take 100 mg by mouth daily. Take with or immediately following a meal.     multivitamin tablet  Take 1 tablet by mouth daily.     tamsulosin 0.4 MG Caps  Commonly known as:  FLOMAX  Take 1 capsule (0.4 mg total) by mouth daily.        Outstanding Labs/Studies  None  Duration of Discharge Encounter   Greater than 30 minutes including physician time.  Signed, Nicolasa Ducking NP 04/27/2012, 1:05 PM

## 2012-04-27 NOTE — Telephone Encounter (Signed)
Called the patient informed sent rx requested to [facility].

## 2012-04-27 NOTE — Progress Notes (Signed)
Pt provided with dc instructions and education. Pt verbalized understanding. Pt HR stable at this time remaining NSR 60s with ambulation. IV removed with tip intact. Heart monitor cleaned and returned to front. Levonne Spiller, Rn

## 2012-04-27 NOTE — Progress Notes (Signed)
  Patient Name: Jonathan Orozco      SUBJECTIVE: reverteed to sinus overnight  Not particularly aware Without complaints of sob or cp  Past Medical History  Diagnosis Date  . MELANOMA, MALIGNANT, SKIN NOS 09/29/2006  . HYPERLIPIDEMIA 09/29/2006  . ERECTILE DYSFUNCTION 09/29/2006  . 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 ABUSE, ALCOHOL, IN REMISSION 04/08/2007  . Other specified forms of hearing loss 08/10/2009  . HYPERTENSION 09/29/2006  . VENTRICULAR TACHYCARDIA 09/29/2006  . SINUSITIS- ACUTE-NOS 06/23/2007  . ALLERGIC RHINITIS 09/29/2006  . BENIGN PROSTATIC HYPERTROPHY 09/29/2006  . OSTEOARTHROSIS NOS, OTHER [facility] SITE 09/29/2006  . BACK PAIN 09/12/2008  . LEG PAIN, RIGHT 10/02/2008  . VERTIGO 04/08/2007  . COLONIC POLYPS, HX OF 06/23/2007  . CHEST PAIN 07/04/2008  . FATIGUE 11/16/2009  . VIRAL GASTROENTERITIS 01/29/2010  . GLUCOSE INTOLERANCE 03/19/2010  . Atrial flutter 03/28/2010  . Long term (current) use of anticoagulants 04/26/2010    PHYSICAL EXAM Filed Vitals:   04/26/12 1831 04/26/12 1926 04/27/12 0612 04/27/12 0656  BP: 114/74 108/69 100/66 98/66  Pulse:  56 73 80  Temp:  97.6 F (36.4 C) 98.1 F (36.7 C)   TempSrc:  Oral Oral   Resp:  18 19   Height:      Weight:      SpO2:  97% 97%    Well developed and nourished in no acute distress HENT normal Neck supple with JVP-flat Clear Regular rate and rhythm, no murmurs or gallops Abd-soft with active BS No Clubbing cyanosis edema Skin-warm and dry A & Oriented  Grossly normal sensory and motor function  TELEMETRY: Reviewed telemetry pt in aflutter>>afib>>sinus:    Intake/Output Summary (Last 24 hours) at 04/27/12 0820 Last data filed at 04/27/12 1610  Gross per 24 hour  Intake    240 ml  Output   1200 ml  Net   -960 ml    LABS: Basic Metabolic Panel:  Recent Labs Lab 04/22/12 0913 04/22/12 0925 04/24/12 1250 04/24/12 1523  NA 134* 138 133*  --   K 4.1 4.0 5.4* 4.5  CL 99 103 99  --   CO2 25  --  23   --   GLUCOSE 117* 117* 114*  --   BUN 12 13 15   --   CREATININE 0.79 0.80 0.76  --   CALCIUM 9.4  --  9.1  --    Cardiac Enzymes: No results found for this basename: CKTOTAL, CKMB, CKMBINDEX, TROPONINI,  in the last 72 hours CBC:  Recent Labs Lab 04/22/12 0913 04/22/12 0925 04/24/12 1250  WBC 6.6  --  6.4  NEUTROABS 2.5  --  2.9  HGB 16.0 16.0 14.8  HCT 46.2 47.0 43.4  MCV 92.2  --  92.7  PLT 239  --  266   ASSESSMENT AND PLAN:  Principal Problem:   Atrial flutter Active Problems:   HYPERLIPIDEMIA   HYPERTENSION   Long term (current) use of anticoagulants  Spontaneous cardioversion overnight Discharge on admission meds incl dabigitran  D/c dilt   Signed, Sherryl Manges MD  04/27/2012

## 2012-04-28 ENCOUNTER — Telehealth: Payer: Self-pay | Admitting: *Deleted

## 2012-04-28 ENCOUNTER — Telehealth: Payer: Self-pay | Admitting: Family Medicine

## 2012-04-28 NOTE — Telephone Encounter (Signed)
Transitional Care Call attempted x3 today, no response.  Will try again tomorrow.

## 2012-04-28 NOTE — Telephone Encounter (Signed)
TCM pt: spoke with pt and wife. Pt  is doing well;  Pt is aware of appointment with Dr. Graciela Husbands on 05/19/12 at 9:15 Am.

## 2012-04-29 ENCOUNTER — Telehealth: Payer: Self-pay | Admitting: Internal Medicine

## 2012-04-29 ENCOUNTER — Other Ambulatory Visit: Payer: Self-pay | Admitting: *Deleted

## 2012-04-29 DIAGNOSIS — I4891 Unspecified atrial fibrillation: Secondary | ICD-10-CM

## 2012-04-29 MED ORDER — DABIGATRAN ETEXILATE MESYLATE 150 MG PO CAPS: 150.0000 mg | ORAL_CAPSULE | Freq: Two times a day (BID) | ORAL | Status: DC

## 2012-04-29 MED ORDER — METOPROLOL SUCCINATE ER 100 MG PO TB24: 100.0000 mg | ORAL_TABLET | Freq: Every day | ORAL | Status: DC

## 2012-04-29 NOTE — Telephone Encounter (Signed)
New Prob   Requesting prescription for metoprolol and pradaxa be sent to [facility].

## 2012-04-29 NOTE — Telephone Encounter (Signed)
I verified with the patient's wife that he is taking Pradaxa 150 mg BID and Metoprolol succ 100 mg daily. 3 month supply has been sent to [facility].

## 2012-05-19 ENCOUNTER — Encounter: Payer: Self-pay | Admitting: Internal Medicine

## 2012-05-19 ENCOUNTER — Ambulatory Visit (INDEPENDENT_AMBULATORY_CARE_PROVIDER_SITE_OTHER): Payer: Medicare Other | Admitting: Internal Medicine

## 2012-05-19 VITALS — BP 132/70 | HR 67 | Ht 72.0 in | Wt 176.4 lb

## 2012-05-19 DIAGNOSIS — E875 Hyperkalemia: Secondary | ICD-10-CM | POA: Insufficient documentation

## 2012-05-19 DIAGNOSIS — I4892 Unspecified atrial flutter: Secondary | ICD-10-CM

## 2012-05-19 MED ORDER — WARFARIN SODIUM 5 MG PO TABS: 5.0000 mg | ORAL_TABLET | Freq: Every day | ORAL | Status: DC

## 2012-05-19 NOTE — Progress Notes (Signed)
Patient Care Team: Corwin Levins, MD as PCP - General   HPI  Jonathan Orozco is a 79 y.o. male Seen in followup for outflow tract ectopy and atrial flutters for which  he underwent EP testing and 3-D mapping which failed to produce stable arrhythmias; medical therapy was undertaken  In February we discussed anticoagulation. He elected to pursue NOAC  therapy with Pradaxa In March he was hospitalized because of  Rapid atrial fibrillation; TE cardioversion was anticipated but he reverted spontaneously. He was discharged home on rate control with diltiazem and metoprolol  Blood work in hospital was abnormal notably for a potassium of 5.4 without evidence of hemolysis; renal function was normal    He is having complaints of abdominal bloating and diarrhea since having started with Pradaxa he would like to go back on warfarin  Past Medical History  Diagnosis Date  . MELANOMA, MALIGNANT, SKIN NOS 09/29/2006  . HYPERLIPIDEMIA 09/29/2006  . ERECTILE DYSFUNCTION 09/29/2006  . 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 ABUSE, ALCOHOL, IN REMISSION 04/08/2007  . Other specified forms of hearing loss 08/10/2009  . HYPERTENSION 09/29/2006  . VENTRICULAR TACHYCARDIA 09/29/2006  . SINUSITIS- ACUTE-NOS 06/23/2007  . ALLERGIC RHINITIS 09/29/2006  . BENIGN PROSTATIC HYPERTROPHY 09/29/2006  . OSTEOARTHROSIS NOS, OTHER [facility] SITE 09/29/2006  . BACK PAIN 09/12/2008  . LEG PAIN, RIGHT 10/02/2008  . VERTIGO 04/08/2007  . COLONIC POLYPS, HX OF 06/23/2007  . CHEST PAIN 07/04/2008  . FATIGUE 11/16/2009  . VIRAL GASTROENTERITIS 01/29/2010  . GLUCOSE INTOLERANCE 03/19/2010  . Atrial flutter 03/28/2010    a. s/p prior rfca;  b. recurrent paroxysmal flutter 04/2012;  c. pradaxa initiated 04/2012.  [name] Long term (current) use of anticoagulants 04/26/2010    Past Surgical History  Procedure Laterality Date  . Left rotato cuff    . Joint replacement      Right knee  . Tonsillectomy    . Left knee arthroscopy      Current  Outpatient Prescriptions  Medication Sig Dispense Refill  . dabigatran (PRADAXA) 150 MG CAPS Take 1 capsule (150 mg total) by mouth every 12 (twelve) hours.  180 capsule  3  . diltiazem (MATZIM LA) 360 MG 24 hr tablet Take 1 tablet (360 mg total) by mouth daily.  90 tablet  3  . fish oil-omega-3 fatty acids 1000 MG capsule Take 1 g by mouth daily.        [name] HYDROcodone-acetaminophen (NORCO) 5-325 MG per tablet Take 1 tablet by mouth every 6 (six) hours as needed.  90 tablet  1  . LORazepam (ATIVAN) 0.5 MG tablet Take 1 tablet (0.5 mg total) by mouth 2 (two) times daily as needed for anxiety.  60 tablet  1  . metoprolol succinate (TOPROL-XL) 100 MG 24 hr tablet Take 1 tablet (100 mg total) by mouth daily. Take with or immediately following a meal.  90 tablet  3  . Multiple Vitamin (MULTIVITAMIN) tablet Take 1 tablet by mouth daily.        . Tamsulosin HCl (FLOMAX) 0.4 MG CAPS Take 1 capsule (0.4 mg total) by mouth daily.  90 capsule  1   No current facility-administered medications for this visit.    Allergies  Allergen Reactions  . Ace Inhibitors     REACTION: cough  . Amiodarone Hcl     REACTION: intolerance    Review of Systems negative except from HPI and PMH  Physical Exam BP 132/70  Pulse 67  Ht 6' (1.829  m)  Wt 176 lb 6.4 oz (80.015 kg)  BMI 23.92 kg/m2 Well developed and nourished in no acute distress HENT normal Neck supple with JVP-flat Clear Regular rate and rhythm, no murmurs or gallops Abd-soft with active BS No Clubbing cyanosis edema Skin-warm and dry A & Oriented  Grossly normal sensory and motor function   ECG demonstrates sinus rhythm at 60 Intervals 22/14/44 Axis LXV  Assessment and  Plan

## 2012-05-19 NOTE — Patient Instructions (Addendum)
Your physician wants you to follow-up in: 6 MONTHS WITH DR Logan Bores will receive a reminder letter in the mail two months in advance. If you don't receive a letter, please call our office to schedule the follow-up appointment.   STOP PRADAXA  RESTART WARFARIN 5 MG ALTERNATING WITH 7.5 MG DAILY  COUMADIN APPT IN 10 DAYS AT [facility]

## 2012-05-19 NOTE — Assessment & Plan Note (Signed)
No recurrent atrial arrhythmias of which we know. We will continue him on rate control. He is having GI symptoms related to his dabigitran. We discussed alternative NOACs; he would like to resume Coumadin. We will do so.

## 2012-05-19 NOTE — Assessment & Plan Note (Signed)
Patient's potassium level was 5.4 3 weeks ago. He is disinclined to have [place] repeated today. He would like it done as part of his annual physical and he will call Dr. Jonny Ruiz.

## 2012-05-19 NOTE — Assessment & Plan Note (Signed)
No intercurrent Ventricular tachycardia  

## 2012-05-21 ENCOUNTER — Other Ambulatory Visit (INDEPENDENT_AMBULATORY_CARE_PROVIDER_SITE_OTHER): Payer: Medicare Other

## 2012-05-21 ENCOUNTER — Encounter: Payer: Self-pay | Admitting: Internal Medicine

## 2012-05-21 ENCOUNTER — Ambulatory Visit (INDEPENDENT_AMBULATORY_CARE_PROVIDER_SITE_OTHER): Payer: Medicare Other | Admitting: Internal Medicine

## 2012-05-21 VITALS — BP 120/88 | HR 77 | Temp 97.0°F | Ht 72.0 in | Wt 174.0 lb

## 2012-05-21 DIAGNOSIS — M545 Other chronic pain: Secondary | ICD-10-CM | POA: Insufficient documentation

## 2012-05-21 DIAGNOSIS — N393 Stress incontinence (female) (male): Secondary | ICD-10-CM

## 2012-05-21 DIAGNOSIS — Z0000 Encounter for general adult medical examination without abnormal findings: Secondary | ICD-10-CM

## 2012-05-21 DIAGNOSIS — I10 Essential (primary) hypertension: Secondary | ICD-10-CM

## 2012-05-21 DIAGNOSIS — G8929 Other chronic pain: Secondary | ICD-10-CM

## 2012-05-21 DIAGNOSIS — F411 Generalized anxiety disorder: Secondary | ICD-10-CM

## 2012-05-21 DIAGNOSIS — E785 Hyperlipidemia, unspecified: Secondary | ICD-10-CM

## 2012-05-21 LAB — LIPID PANEL
HDL: 47.7 mg/dL (ref >39.00)
LDL Cholesterol: 109 mg/dL — ABNORMAL HIGH (ref 0–99)
Total CHOL/HDL Ratio: 3
Triglycerides: 45 mg/dL (ref 0.0–149.0)

## 2012-05-21 LAB — URINALYSIS, ROUTINE W REFLEX MICROSCOPIC
Specific Gravity, Urine: 1.02 (ref 1.000–1.030)
Total Protein, Urine: NEGATIVE
Urine Glucose: NEGATIVE
pH: 6 (ref 5.0–8.0)

## 2012-05-21 LAB — CBC WITH DIFFERENTIAL/PLATELET
Basophils Relative: 0.3 % (ref 0.0–3.0)
Eosinophils Absolute: 0.1 10*3/uL (ref 0.0–0.7)
Eosinophils Relative: 1.2 % (ref 0.0–5.0)
Hemoglobin: 15.2 g/dL (ref 13.0–17.0)
Lymphocytes Relative: 52.7 % — ABNORMAL HIGH (ref 12.0–46.0)
MCHC: 33.6 g/dL (ref 30.0–36.0)
Monocytes Relative: 9.5 % (ref 3.0–12.0)
Neutro Abs: 2.1 10*3/uL (ref 1.4–7.7)
RBC: 4.75 Mil/uL (ref 4.22–5.81)

## 2012-05-21 LAB — HEPATIC FUNCTION PANEL
Bilirubin, Direct: 0.1 mg/dL (ref 0.0–0.3)
Total Protein: 7.2 g/dL (ref 6.0–8.3)

## 2012-05-21 LAB — BASIC METABOLIC PANEL
CO2: 29 mEq/L (ref 19–32)
Calcium: 9.3 mg/dL (ref 8.4–10.5)
Creatinine, Ser: 0.9 mg/dL (ref 0.4–1.5)

## 2012-05-21 MED ORDER — HYDROCODONE-ACETAMINOPHEN 5-325 MG PO TABS: 1.0000 | ORAL_TABLET | Freq: Three times a day (TID) | ORAL | Status: DC | PRN

## 2012-05-21 MED ORDER — CITALOPRAM HYDROBROMIDE 10 MG PO TABS: 10.0000 mg | ORAL_TABLET | Freq: Every day | ORAL | Status: DC

## 2012-05-21 NOTE — Assessment & Plan Note (Signed)
For urology referral 

## 2012-05-21 NOTE — Patient Instructions (Signed)
Please take all new medication as prescribed - the citalopram 10 mg for nerves Please continue all other medications as before, and refills have been done if requested - the hydrocodone You will be contacted regarding the referral for: urology Please keep your appointments with your specialists as you have planned Please go to the LAB in the Basement (turn left off the elevator) for the tests to be done today You will be contacted by phone if any changes need to be made immediately.  Otherwise, you will receive a letter about your results with an explanation, but please check with MyChart first. Thank you for enrolling in MyChart. Please follow the instructions below to securely access your online medical record. MyChart allows you to send messages to your doctor, view your test results, renew your prescriptions, schedule appointments, and more. Please return in 6 months, or sooner if needed

## 2012-05-21 NOTE — Assessment & Plan Note (Signed)
stable overall by history and exam, and pt to continue medical treatment as before,  to f/u any worsening symptoms or concerns 

## 2012-05-21 NOTE — Assessment & Plan Note (Signed)
Ok to add citalopram 10 qd 

## 2012-05-21 NOTE — Assessment & Plan Note (Signed)

## 2012-05-21 NOTE — Progress Notes (Signed)
Subjective:    Patient ID: Jonathan Orozco, male    DOB: 02/19/1928, 79 y.o.   MRN: [id number]  HPI  Here for wellness and f/u;  Overall doing ok;  Pt denies CP, worsening SOB, DOE, wheezing, orthopnea, PND, worsening LE edema, palpitations, dizziness or syncope.  Pt denies neurological change such as new headache, facial or extremity weakness.  Pt denies polydipsia, polyuria, or low sugar symptoms. Pt states overall good compliance with treatment and medications, good tolerability, and has been trying to follow lower cholesterol diet.  Pt denies worsening depressive symptoms, suicidal ideation or panic. No fever, night sweats, wt loss, loss of appetite, or other constitutional symptoms.  Pt states good ability with ADL's, has low fall risk, home safety reviewed and adequate, no other significant changes in hearing or vision, and only occasionally active with exercise.  Pt continues to have recurring LBP without change in severity, bowel or bladder change, fever, wt loss,  worsening LE pain/numbness/weakness, gait change or falls.  Has ongoing urinary incontinence/leakage for > 6 mo.   Has increased anxiety with recent hospn Past Medical History  Diagnosis Date  . MELANOMA, MALIGNANT, SKIN NOS 09/29/2006  . HYPERLIPIDEMIA 09/29/2006  . ERECTILE DYSFUNCTION 09/29/2006  . 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 ABUSE, ALCOHOL, IN REMISSION 04/08/2007  . Other specified forms of hearing loss 08/10/2009  . HYPERTENSION 09/29/2006  . VENTRICULAR TACHYCARDIA 09/29/2006  . SINUSITIS- ACUTE-NOS 06/23/2007  . ALLERGIC RHINITIS 09/29/2006  . BENIGN PROSTATIC HYPERTROPHY 09/29/2006  . OSTEOARTHROSIS NOS, OTHER [facility] SITE 09/29/2006  . BACK PAIN 09/12/2008  . LEG PAIN, RIGHT 10/02/2008  . VERTIGO 04/08/2007  . COLONIC POLYPS, HX OF 06/23/2007  . CHEST PAIN 07/04/2008  . FATIGUE 11/16/2009  . VIRAL GASTROENTERITIS 01/29/2010  . GLUCOSE INTOLERANCE 03/19/2010  . Atrial flutter 03/28/2010    a. s/p prior rfca;  b. recurrent  paroxysmal flutter 04/2012;  c. pradaxa initiated 04/2012.  [name] Long term (current) use of anticoagulants 04/26/2010   Past Surgical History  Procedure Laterality Date  . Left rotato cuff    . Joint replacement      Right knee  . Tonsillectomy    . Left knee arthroscopy      reports that he has quit smoking. He does not have any smokeless tobacco history on file. He reports that he does not drink alcohol or use illicit drugs. family history includes Diabetes in his brother. Allergies  Allergen Reactions  . Ace Inhibitors     REACTION: cough  . Amiodarone Hcl     REACTION: intolerance   Current Outpatient Prescriptions on File Prior to Visit  Medication Sig Dispense Refill  . diltiazem (MATZIM LA) 360 MG 24 hr tablet Take 1 tablet (360 mg total) by mouth daily.  90 tablet  3  . fish oil-omega-3 fatty acids 1000 MG capsule Take 1 g by mouth daily.        [name] LORazepam (ATIVAN) 0.5 MG tablet Take 1 tablet (0.5 mg total) by mouth 2 (two) times daily as needed for anxiety.  60 tablet  1  . metoprolol succinate (TOPROL-XL) 100 MG 24 hr tablet Take 1 tablet (100 mg total) by mouth daily. Take with or immediately following a meal.  90 tablet  3  . Multiple Vitamin (MULTIVITAMIN) tablet Take 1 tablet by mouth daily.        . Tamsulosin HCl (FLOMAX) 0.4 MG CAPS Take 1 capsule (0.4 mg total) by mouth daily.  90 capsule  1  .  warfarin (COUMADIN) 5 MG tablet Take 1 tablet (5 mg total) by mouth daily.  45 tablet  3   No current facility-administered medications on file prior to visit.   Review of Systems Constitutional: Negative for diaphoresis, activity change, appetite change or unexpected weight change.  HENT: Negative for hearing loss, ear pain, facial swelling, mouth sores and neck stiffness.   Eyes: Negative for pain, redness and visual disturbance.  Respiratory: Negative for shortness of breath and wheezing.   Cardiovascular: Negative for chest pain and palpitations.  Gastrointestinal:  Negative for diarrhea, blood in stool, abdominal distention or other pain Genitourinary: Negative for hematuria, flank pain or change in urine volume.  Musculoskeletal: Negative for myalgias and joint swelling.  Skin: Negative for color change and wound.  Neurological: Negative for syncope and numbness. other than noted Hematological: Negative for adenopathy.  Psychiatric/Behavioral: Negative for hallucinations, self-injury, decreased concentration and agitation.      Objective:   Physical Exam BP 120/88  Pulse 77  Temp(Src) 97 F (36.1 C) (Oral)  Ht 6' (1.829 m)  Wt 174 lb (78.926 kg)  BMI 23.59 kg/m2  SpO2 97% VS noted,  Constitutional: Pt is oriented to person, place, and time. Appears well-developed and well-nourished.  Head: Normocephalic and atraumatic.  Right Ear: External ear normal.  Left Ear: External ear normal.  Nose: Nose normal.  Mouth/Throat: Oropharynx is clear and moist.  Eyes: Conjunctivae and EOM are normal. Pupils are equal, round, and reactive to light.  Neck: Normal range of motion. Neck supple. No JVD present. No tracheal deviation present.  Cardiovascular: Normal rate, regular rhythm, normal heart sounds and intact distal pulses.   Pulmonary/Chest: Effort normal and breath sounds normal.  Abdominal: Soft. Bowel sounds are normal. There is no tenderness. No HSM  Musculoskeletal: Normal range of motion. Exhibits no edema.  Lymphadenopathy:  Has no cervical adenopathy.  Neurological: Pt is alert and oriented to person, place, and time. Pt has normal reflexes. No cranial nerve deficit.  Skin: Skin is warm and dry. No rash noted.  Psychiatric:  Has  normal mood and affect. Behavior is normal. 2+ nervous    Assessment & Plan:

## 2012-05-26 ENCOUNTER — Other Ambulatory Visit: Payer: Self-pay | Admitting: Internal Medicine

## 2012-05-28 ENCOUNTER — Ambulatory Visit (INDEPENDENT_AMBULATORY_CARE_PROVIDER_SITE_OTHER): Payer: Medicare Other | Admitting: *Deleted

## 2012-05-28 DIAGNOSIS — I4892 Unspecified atrial flutter: Secondary | ICD-10-CM

## 2012-05-28 LAB — POCT INR: INR: 1.8

## 2012-06-07 ENCOUNTER — Ambulatory Visit (INDEPENDENT_AMBULATORY_CARE_PROVIDER_SITE_OTHER): Payer: Medicare Other | Admitting: *Deleted

## 2012-06-07 DIAGNOSIS — I4892 Unspecified atrial flutter: Secondary | ICD-10-CM

## 2012-06-07 LAB — POCT INR: INR: 2.6

## 2012-06-18 ENCOUNTER — Ambulatory Visit (INDEPENDENT_AMBULATORY_CARE_PROVIDER_SITE_OTHER): Payer: Medicare Other | Admitting: *Deleted

## 2012-06-18 DIAGNOSIS — I4892 Unspecified atrial flutter: Secondary | ICD-10-CM

## 2012-07-02 ENCOUNTER — Ambulatory Visit (INDEPENDENT_AMBULATORY_CARE_PROVIDER_SITE_OTHER): Payer: Medicare Other | Admitting: *Deleted

## 2012-07-02 DIAGNOSIS — I4892 Unspecified atrial flutter: Secondary | ICD-10-CM

## 2012-07-02 MED ORDER — WARFARIN SODIUM 5 MG PO TABS: 5.0000 mg | ORAL_TABLET | Freq: Every day | ORAL | Status: DC

## 2012-07-14 ENCOUNTER — Ambulatory Visit (INDEPENDENT_AMBULATORY_CARE_PROVIDER_SITE_OTHER): Payer: Medicare Other | Admitting: *Deleted

## 2012-07-14 DIAGNOSIS — I4892 Unspecified atrial flutter: Secondary | ICD-10-CM

## 2012-07-28 ENCOUNTER — Ambulatory Visit (INDEPENDENT_AMBULATORY_CARE_PROVIDER_SITE_OTHER): Payer: Medicare Other | Admitting: *Deleted

## 2012-07-28 DIAGNOSIS — I4892 Unspecified atrial flutter: Secondary | ICD-10-CM

## 2012-08-18 ENCOUNTER — Ambulatory Visit (INDEPENDENT_AMBULATORY_CARE_PROVIDER_SITE_OTHER): Payer: Medicare Other | Admitting: *Deleted

## 2012-08-18 DIAGNOSIS — I4892 Unspecified atrial flutter: Secondary | ICD-10-CM

## 2012-09-29 ENCOUNTER — Ambulatory Visit (INDEPENDENT_AMBULATORY_CARE_PROVIDER_SITE_OTHER): Payer: Medicare Other | Admitting: *Deleted

## 2012-09-29 DIAGNOSIS — I4892 Unspecified atrial flutter: Secondary | ICD-10-CM

## 2012-10-27 ENCOUNTER — Ambulatory Visit (INDEPENDENT_AMBULATORY_CARE_PROVIDER_SITE_OTHER): Payer: Medicare Other | Admitting: *Deleted

## 2012-10-27 DIAGNOSIS — I4892 Unspecified atrial flutter: Secondary | ICD-10-CM

## 2012-10-27 LAB — POCT INR: INR: 2.9

## 2012-11-08 ENCOUNTER — Encounter: Payer: Self-pay | Admitting: Internal Medicine

## 2012-11-09 ENCOUNTER — Ambulatory Visit (INDEPENDENT_AMBULATORY_CARE_PROVIDER_SITE_OTHER): Payer: Medicare Other

## 2012-11-09 DIAGNOSIS — Z23 Encounter for immunization: Secondary | ICD-10-CM

## 2012-11-24 ENCOUNTER — Ambulatory Visit (INDEPENDENT_AMBULATORY_CARE_PROVIDER_SITE_OTHER): Payer: Medicare Other | Admitting: *Deleted

## 2012-11-24 DIAGNOSIS — I4892 Unspecified atrial flutter: Secondary | ICD-10-CM

## 2012-11-24 LAB — POCT INR: INR: 2.1

## 2012-12-08 ENCOUNTER — Other Ambulatory Visit: Payer: Medicare Other

## 2012-12-08 ENCOUNTER — Encounter: Payer: Self-pay | Admitting: Internal Medicine

## 2012-12-08 ENCOUNTER — Ambulatory Visit (INDEPENDENT_AMBULATORY_CARE_PROVIDER_SITE_OTHER): Payer: Medicare Other | Admitting: Internal Medicine

## 2012-12-08 VITALS — BP 132/78 | HR 83 | Ht 72.0 in | Wt 168.0 lb

## 2012-12-08 DIAGNOSIS — R141 Gas pain: Secondary | ICD-10-CM

## 2012-12-08 DIAGNOSIS — R198 Other specified symptoms and signs involving the digestive system and abdomen: Secondary | ICD-10-CM

## 2012-12-08 DIAGNOSIS — R194 Change in bowel habit: Secondary | ICD-10-CM

## 2012-12-08 DIAGNOSIS — Z86010 Personal history of colonic polyps: Secondary | ICD-10-CM

## 2012-12-08 MED ORDER — RESTORA PO CAPS: 1.0000 | ORAL_CAPSULE | Freq: Every day | ORAL | Status: DC

## 2012-12-08 NOTE — Progress Notes (Signed)
HISTORY OF PRESENT ILLNESS:  Jonathan Orozco is a 79 y.o. male with multiple medical problems as listed below. He presents himself today for evaluation of altered bowel habits and increased intestinal gas of several years duration. Patient was last seen in May of 2009 when he underwent colonoscopy. He was found to have left-sided diverticulosis and several diminutive colon polyps which were removed and found to be tubular adenomas. The ileum was normal. No routine followup scheduled due to age. As mentioned, he has had difficulties with alternating bowel habits for years. He describes "a funny feeling in my colon". In addition, increased flatus. However, no nausea, vomiting, abdominal pain, or rectal bleeding. His weight has been stable. He was about gluten sensitivity. He reports that he has tried wheat elimination sporadically with uncertain benefit. He has been reading books warning about potential harm from wheat... he denies new medications, travel, or other issues  REVIEW OF SYSTEMS:  All non-GI ROS negative except for hearing problems, arthritis, back pain, muscle cramps  Past Medical History  Diagnosis Date  . MELANOMA, MALIGNANT, SKIN NOS 09/29/2006  . HYPERLIPIDEMIA 09/29/2006  . ERECTILE DYSFUNCTION 09/29/2006  . 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 ABUSE, ALCOHOL, IN REMISSION 04/08/2007  . Other specified forms of hearing loss 08/10/2009  . HYPERTENSION 09/29/2006  . VENTRICULAR TACHYCARDIA 09/29/2006  . SINUSITIS- ACUTE-NOS 06/23/2007  . ALLERGIC RHINITIS 09/29/2006  . BENIGN PROSTATIC HYPERTROPHY 09/29/2006  . OSTEOARTHROSIS NOS, OTHER [facility] SITE 09/29/2006  . BACK PAIN 09/12/2008  . LEG PAIN, RIGHT 10/02/2008  . VERTIGO 04/08/2007  . COLONIC POLYPS, HX OF 06/23/2007  . CHEST PAIN 07/04/2008  . FATIGUE 11/16/2009  . VIRAL GASTROENTERITIS 01/29/2010  . GLUCOSE INTOLERANCE 03/19/2010  . Atrial flutter 03/28/2010    a. s/p prior rfca;  b. recurrent paroxysmal flutter 04/2012;  c. pradaxa  initiated 04/2012.  [name] Long term (current) use of anticoagulants 04/26/2010    Past Surgical History  Procedure Laterality Date  . Left rotato cuff    . Joint replacement      Right knee  . Tonsillectomy    . Left knee arthroscopy      Social History [employer]  reports that he has quit smoking. He has never used smokeless tobacco. He reports that he does not drink alcohol or use illicit drugs.  family history includes Diabetes in his brother.  Allergies  Allergen Reactions  . Ace Inhibitors     REACTION: cough  . Amiodarone Hcl     REACTION: intolerance       PHYSICAL EXAMINATION: Vital signs: BP 132/78  Pulse 83  Ht 6' (1.829 m)  Wt 168 lb (76.204 kg)  BMI 22.78 kg/m2  Constitutional: generally well-appearing, no acute distress Psychiatric: alert and oriented x3, cooperative Eyes: extraocular movements intact, anicteric, conjunctiva pink Mouth: oral pharynx moist, no lesions Neck: supple no lymphadenopathy Cardiovascular: heart regular rate and rhythm, no murmur Lungs: clear to auscultation bilaterally Abdomen: soft, nontender, nondistended, no obvious ascites, no peritoneal signs, normal bowel sounds, no organomegaly Rectal: Omitted Extremities: no lower extremity edema bilaterally. Incomplete extension of the right elbow Skin: no lesions on visible extremities Neuro: No focal deficits. No asterixis.    ASSESSMENT:  #1. Alternating bowel habits. chronic problem. No alarm features #2. Increased intestinal gas #3. Colonoscopy 2009 with diverticulosis and diminutive adenomas #4. Multiple general medical problems    PLAN:  #1. Check tissue transglutaminase antibody as a screening tool for celiac disease #2. Recommend daily fiber supplementation  with Metamucil to help with swings in bowel habits #3. Probiotic. One daily for 10 days. Samples given. May use on demand #4. Return to the care of your PCP. GI followup as needed

## 2012-12-08 NOTE — Patient Instructions (Signed)
Your physician has requested that you go to the basement for the following lab work before leaving today:  TTG  You have been given some samples of Restora - take one a day.  Take Metamucil daily

## 2012-12-09 ENCOUNTER — Telehealth: Payer: Self-pay | Admitting: Internal Medicine

## 2012-12-09 NOTE — Telephone Encounter (Signed)
Dr. Marina Goodell please see note from Dr. Graciela Husbands below. I will schedule the pt tomorrow for an EGD.

## 2012-12-09 NOTE — Telephone Encounter (Signed)
Should be acceptable risk to hold coumadin

## 2012-12-09 NOTE — Telephone Encounter (Signed)
Pt had elevated celiac lab value. Dr. Marina Goodell needs to schedule pt for an endoscopy for biopsies to see if the pt does have celiac disease. Pt is taking coumadin and will need to stop this for 3 days prior to the EGD.  Dr. Graciela Husbands pt states he is seen by you and his coumadin is managed there at [facility]. Please advise if the pt may hold his coumadin for 3 days prior to an EGD with biopsies.

## 2012-12-10 NOTE — Telephone Encounter (Signed)
Pt scheduled for EGD in the [facility] 12/23/12 @10 :30am. Pt to arrive at 9:30am. Pt to hold coumadin for 3 days prior to the procedure. Pt coming in Monday 12/13/12@10am  for instruction and to sign his paperwork. Pt aware.

## 2012-12-10 NOTE — Telephone Encounter (Signed)
Noted! Thank you

## 2012-12-14 ENCOUNTER — Telehealth: Payer: Self-pay | Admitting: Internal Medicine

## 2012-12-14 NOTE — Telephone Encounter (Signed)
Patients wife informed and confirmed appointment for 12/15/12.

## 2012-12-14 NOTE — Telephone Encounter (Signed)
12/14/2012  Pt's has appt on 12/15/2012 for ear wax impaction and pt's spouse wants to know what they can do to loosen up the wax before the appt.  Please advise.

## 2012-12-14 NOTE — Telephone Encounter (Signed)
Ok to try peroxide to the ear canal several drops to see if this can help

## 2012-12-15 ENCOUNTER — Ambulatory Visit (INDEPENDENT_AMBULATORY_CARE_PROVIDER_SITE_OTHER): Payer: Medicare Other | Admitting: Internal Medicine

## 2012-12-15 ENCOUNTER — Encounter: Payer: Self-pay | Admitting: Internal Medicine

## 2012-12-15 VITALS — BP 132/80 | HR 90 | Temp 98.7°F | Ht 71.0 in | Wt 171.5 lb

## 2012-12-15 DIAGNOSIS — H9193 Unspecified hearing loss, bilateral: Secondary | ICD-10-CM

## 2012-12-15 DIAGNOSIS — H9190 Unspecified hearing loss, unspecified ear: Secondary | ICD-10-CM

## 2012-12-15 DIAGNOSIS — I10 Essential (primary) hypertension: Secondary | ICD-10-CM

## 2012-12-15 DIAGNOSIS — G8929 Other chronic pain: Secondary | ICD-10-CM

## 2012-12-15 DIAGNOSIS — M545 Low back pain: Secondary | ICD-10-CM

## 2012-12-15 MED ORDER — HYDROCODONE-ACETAMINOPHEN 5-325 MG PO TABS: 1.0000 | ORAL_TABLET | Freq: Every day | ORAL | Status: DC | PRN

## 2012-12-15 NOTE — Patient Instructions (Signed)
Your ears were cleared of wax today by water irrigation Please continue all other medications as before, and refills have been done if requested. - the hydrocodone Please have the pharmacy call with any other refills you may need.

## 2012-12-15 NOTE — Progress Notes (Signed)
Subjective:    Patient ID: Jonathan Orozco, male    DOB: 26-Jul-1928, 79 y.o.   MRN: [id number]  HPI  Here with simple decreased bilat hearing for the past wk, has been related to wax impactions in the past, he tried otc peroxide but unsuccessful.  No fever, HA, ST, cough.  Pt continues to have recurring LBP without change in severity, bowel or bladder change, fever, wt loss,  worsening LE pain/numbness/weakness, gait change or falls.  Pt denies chest pain, increased sob or doe, wheezing, orthopnea, PND, increased LE swelling, palpitations, dizziness or syncope.  Pt denies new neurological symptoms such as new headache, or facial or extremity weakness or numbness Past Medical History  Diagnosis Date  . MELANOMA, MALIGNANT, SKIN NOS 09/29/2006  . HYPERLIPIDEMIA 09/29/2006  . ERECTILE DYSFUNCTION 09/29/2006  . 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 ABUSE, ALCOHOL, IN REMISSION 04/08/2007  . Other specified forms of hearing loss 08/10/2009  . HYPERTENSION 09/29/2006  . VENTRICULAR TACHYCARDIA 09/29/2006  . SINUSITIS- ACUTE-NOS 06/23/2007  . ALLERGIC RHINITIS 09/29/2006  . BENIGN PROSTATIC HYPERTROPHY 09/29/2006  . OSTEOARTHROSIS NOS, OTHER [facility] SITE 09/29/2006  . BACK PAIN 09/12/2008  . LEG PAIN, RIGHT 10/02/2008  . VERTIGO 04/08/2007  . COLONIC POLYPS, HX OF 06/23/2007  . CHEST PAIN 07/04/2008  . FATIGUE 11/16/2009  . VIRAL GASTROENTERITIS 01/29/2010  . GLUCOSE INTOLERANCE 03/19/2010  . Atrial flutter 03/28/2010    a. s/p prior rfca;  b. recurrent paroxysmal flutter 04/2012;  c. pradaxa initiated 04/2012.  [name] Long term (current) use of anticoagulants 04/26/2010   Past Surgical History  Procedure Laterality Date  . Left rotato cuff    . Joint replacement      Right knee  . Tonsillectomy    . Left knee arthroscopy      reports that he has quit smoking. He has never used smokeless tobacco. He reports that he does not drink alcohol or use illicit drugs. family history includes Diabetes in his  brother. Allergies  Allergen Reactions  . Ace Inhibitors     REACTION: cough  . Amiodarone Hcl     REACTION: intolerance   Current Outpatient Prescriptions on File Prior to Visit  Medication Sig Dispense Refill  . citalopram (CELEXA) 10 MG tablet Take 1 tablet (10 mg total) by mouth daily.  90 tablet  3  . fish oil-omega-3 fatty acids 1000 MG capsule Take 1 g by mouth daily.        [name] LORazepam (ATIVAN) 0.5 MG tablet Take 1 tablet (0.5 mg total) by mouth 2 (two) times daily as needed for anxiety.  60 tablet  1  . MATZIM LA 360 MG 24 hr tablet TAKE 1 TABLET DAILY  90 tablet  2  . metoprolol succinate (TOPROL-XL) 100 MG 24 hr tablet Take 1 tablet (100 mg total) by mouth daily. Take with or immediately following a meal.  90 tablet  3  . Multiple Vitamin (MULTIVITAMIN) tablet Take 1 tablet by mouth daily.        . Probiotic Product (RESTORA) CAPS Take 1 capsule by mouth daily.  10 capsule  0  . Tamsulosin HCl (FLOMAX) 0.4 MG CAPS Take 1 capsule (0.4 mg total) by mouth daily.  90 capsule  1  . warfarin (COUMADIN) 5 MG tablet Take 1 tablet (5 mg total) by mouth daily. Take As Directed per Anticoagulation Clinic  150 tablet  1   No current facility-administered medications on file prior to visit.    Review of  Systems All otherwise neg per pt     Objective:   Physical Exam BP 132/80  Pulse 90  Temp(Src) 98.7 F (37.1 C) (Oral)  Ht 5\' 11"  (1.803 m)  Wt 171 lb 8 oz (77.792 kg)  BMI 23.93 kg/m2  SpO2 97% VS noted, not ill appearing Constitutional: Pt appears well-developed and well-nourished.  HENT: Head: NCAT.  Right Ear: External ear normal.  Left Ear: External ear normal.  Eyes: Conjunctivae and EOM are normal. Pupils are equal, round, and reactive to light.  Neck: Normal range of motion. Neck supple.  Cardiovascular: Normal rate and regular rhythm.   Pulmonary/Chest: Effort normal and breath sounds normal.  Abd:  Soft, NT, non-distended, + BS Neurological: Pt is alert. Not  confused , hearing improved after bilat wax impactions irrigated with water Skin: Skin is warm. No erythema.  Psychiatric: Pt behavior is normal. Thought content normal.         Assessment & Plan:

## 2012-12-15 NOTE — Assessment & Plan Note (Signed)
Ok for low dose hydrocodone prn only, not scheduled; 1 per day prn

## 2012-12-15 NOTE — Progress Notes (Signed)
Pre-visit discussion using our clinic review tool. No additional management support is needed unless otherwise documented below in the visit note.  

## 2012-12-15 NOTE — Assessment & Plan Note (Signed)
Improved s/p wax impactions irriagation,  to f/u any worsening symptoms or concerns

## 2012-12-15 NOTE — Assessment & Plan Note (Signed)
stable overall by history and exam, recent data reviewed with pt, and pt to continue medical treatment as before,  to f/u any worsening symptoms or concerns BP Readings from Last 3 Encounters:  12/15/12 132/80  12/08/12 132/78  05/21/12 120/88

## 2012-12-20 ENCOUNTER — Ambulatory Visit (INDEPENDENT_AMBULATORY_CARE_PROVIDER_SITE_OTHER): Payer: Medicare Other | Admitting: *Deleted

## 2012-12-20 DIAGNOSIS — I4892 Unspecified atrial flutter: Secondary | ICD-10-CM

## 2012-12-20 LAB — POCT INR: INR: 2.5

## 2012-12-23 ENCOUNTER — Encounter: Payer: Self-pay | Admitting: Internal Medicine

## 2012-12-23 ENCOUNTER — Ambulatory Visit (AMBULATORY_SURGERY_CENTER): Payer: Medicare Other | Admitting: Internal Medicine

## 2012-12-23 VITALS — BP 123/71 | HR 62 | Temp 96.5°F | Resp 15 | Ht 72.0 in | Wt 168.0 lb

## 2012-12-23 DIAGNOSIS — R194 Change in bowel habit: Secondary | ICD-10-CM

## 2012-12-23 DIAGNOSIS — K900 Celiac disease: Secondary | ICD-10-CM

## 2012-12-23 MED ORDER — SODIUM CHLORIDE 0.9 % IV SOLN: 500.0000 mL | INTRAVENOUS | Status: DC

## 2012-12-23 NOTE — Op Note (Signed)
[facility] [address] [place], [address]   ENDOSCOPY PROCEDURE REPORT  PATIENT: Jonathan Orozco, Jonathan Orozco  MR#: [id number] BIRTHDATE: [birth year] , 84  yrs. old GENDER: Male ENDOSCOPIST: Roxy Cedar, MD REFERRED BY:  Office PROCEDURE DATE:  12/23/2012 PROCEDURE:  EGD w/ biopsy ASA CLASS:     Class III INDICATIONS:  abnormal Serology (TTG ab = 59). MEDICATIONS: MAC sedation, administered by CRNA and propofol (Diprivan) 150mg  IV TOPICAL ANESTHETIC: none  DESCRIPTION OF PROCEDURE: After the risks benefits and alternatives of the procedure were thoroughly explained, informed consent was obtained.  The LB [id number] endoscope was introduced through the mouth and advanced to the third portion of the duodenum. Without limitations.  The instrument was slowly withdrawn as the mucosa was fully examined.      The upper, middle and distal third of the esophagus were carefully inspected and no abnormalities were noted.  The z-line was well seen at the GEJ.  The endoscope was pushed into the fundus which was normal including a retroflexed view.  The antrum, gastric body, first and second/third  part of the duodenum were unremarkable. 2 bx from each portion of duodenum taken.  Retroflexed views revealed a hiatal hernia.     The scope was then withdrawn from the patient and the procedure completed.  COMPLICATIONS: There were no complications. ENDOSCOPIC IMPRESSION: 1. Normal EGD  RECOMMENDATIONS: 1.  Await biopsy results 2.  Resume COUMADIN TODAY (with subsequent monitoring as you normally do)  REPEAT EXAM:  eSigned:  Roxy Cedar, MD 12/23/2012 11:15 AM   UX:LKGMW Ellin Mayhew, MD and The Patient

## 2012-12-23 NOTE — Progress Notes (Signed)
Added to pathology slip pt's serology for sprue was positive per Dr. Marina Goodell. Maw

## 2012-12-23 NOTE — Progress Notes (Signed)
Lidocaine-40mg IV prior to Propofol InductionPropofol given over incremental dosages 

## 2012-12-23 NOTE — Patient Instructions (Signed)
YOU HAD AN ENDOSCOPIC PROCEDURE TODAY AT THE [facility]: Refer to the procedure report that was given to you for any specific questions about what was found during the examination.  If the procedure report does not answer your questions, please call your gastroenterologist to clarify.  If you requested that your care partner not be given the details of your procedure findings, then the procedure report has been included in a sealed envelope for you to review at your convenience later.  YOU SHOULD EXPECT: Some feelings of bloating in the abdomen. Passage of more gas than usual.  Walking can help get rid of the air that was put into your GI tract during the procedure and reduce the bloating. If you had a lower endoscopy (such as a colonoscopy or flexible sigmoidoscopy) you may notice spotting of blood in your stool or on the toilet paper. If you underwent a bowel prep for your procedure, then you may not have a normal bowel movement for a few days.  DIET: Your first meal following the procedure should be a light meal and then it is ok to progress to your normal diet.  A half-sandwich or bowl of soup is an example of a good first meal.  Heavy or fried foods are harder to digest and may make you feel nauseous or bloated.  Likewise meals heavy in dairy and vegetables can cause extra gas to form and this can also increase the bloating.  Drink plenty of fluids but you should avoid alcoholic beverages for 24 hours.  ACTIVITY: Your care partner should take you home directly after the procedure.  You should plan to take it easy, moving slowly for the rest of the day.  You can resume normal activity the day after the procedure however you should NOT DRIVE or use heavy machinery for 24 hours (because of the sedation medicines used during the test).    SYMPTOMS TO REPORT IMMEDIATELY: A gastroenterologist can be reached at any hour.  During normal business hours, 8:30 AM to 5:00 PM Monday through Friday,  call [phone].  After hours and on weekends, please call the GI answering service at [phone] who will take a message and have the physician on call contact you.    Following upper endoscopy (EGD)  Vomiting of blood or coffee ground material  New chest pain or pain under the shoulder blades  Painful or persistently difficult swallowing  New shortness of breath  Fever of 100F or higher  Black, tarry-looking stools  FOLLOW UP: If any biopsies were taken you will be contacted by phone or by letter within the next 1-3 weeks.  Call your gastroenterologist if you have not heard about the biopsies in 3 weeks.  Our staff will call the home number listed on your records the next business day following your procedure to check on you and address any questions or concerns that you may have at that time regarding the information given to you following your procedure. This is a courtesy call and so if there is no answer at the home number and we have not heard from you through the emergency physician on call, we will assume that you have returned to your regular daily activities without incident.  SIGNATURES/CONFIDENTIALITY: You and/or your care partner have signed paperwork which will be entered into your electronic medical record.  These signatures attest to the fact that that the information above on your After Visit Summary has been reviewed and is understood.  Full   responsibility of the confidentiality of this discharge information lies with you and/or your care-partner.  NOrmal upper GI endoscopy. Await biopsy results. Resume Coumadin today-  With monitoring as you usually do.

## 2012-12-23 NOTE — Progress Notes (Signed)
Patient did not experience any of the following events: a burn prior to discharge; a fall within the facility; wrong site/side/patient/procedure/implant event; or a hospital transfer or hospital admission upon discharge from the facility. (G8907) Patient did not have preoperative order for IV antibiotic SSI prophylaxis. (G8918)  

## 2012-12-23 NOTE — Progress Notes (Signed)
Called to room to assist during endoscopic procedure.  Patient ID and intended procedure confirmed with present staff. Received instructions for my participation in the procedure from the performing physician.  

## 2012-12-24 ENCOUNTER — Telehealth: Payer: Self-pay | Admitting: *Deleted

## 2012-12-24 NOTE — Telephone Encounter (Signed)
  Follow up Call-  Call back number 12/23/2012  Post procedure Call Back phone  # [phone] or [phone]  Permission to leave phone message Yes     Patient questions:  Do you have a fever, pain , or abdominal swelling? no Pain Score  0 *  Have you tolerated food without any problems? yes  Have you been able to return to your normal activities? yes  Do you have any questions about your discharge instructions: Diet   no Medications  no Follow up visit  no  Do you have questions or concerns about your Care? no  Actions: * If pain score is 4 or above: No action needed, pain <4.

## 2012-12-28 ENCOUNTER — Encounter: Payer: Self-pay | Admitting: Internal Medicine

## 2013-01-24 ENCOUNTER — Telehealth: Payer: Self-pay | Admitting: Internal Medicine

## 2013-01-24 NOTE — Telephone Encounter (Signed)
Left message for pt that office was not open yesterday and that must have been an old message or an error.

## 2013-01-31 ENCOUNTER — Ambulatory Visit (INDEPENDENT_AMBULATORY_CARE_PROVIDER_SITE_OTHER): Payer: Medicare Other | Admitting: Pharmacist

## 2013-01-31 DIAGNOSIS — I4892 Unspecified atrial flutter: Secondary | ICD-10-CM

## 2013-01-31 LAB — POCT INR: INR: 1.9

## 2013-02-09 ENCOUNTER — Telehealth: Payer: Self-pay

## 2013-02-09 DIAGNOSIS — I4892 Unspecified atrial flutter: Secondary | ICD-10-CM

## 2013-02-09 MED ORDER — WARFARIN SODIUM 5 MG PO TABS: ORAL_TABLET | ORAL | Status: DC

## 2013-02-09 NOTE — Telephone Encounter (Signed)
Warfarin refilled as requested. 

## 2013-02-11 ENCOUNTER — Ambulatory Visit (INDEPENDENT_AMBULATORY_CARE_PROVIDER_SITE_OTHER): Payer: Medicare Other | Admitting: Internal Medicine

## 2013-02-11 ENCOUNTER — Encounter: Payer: Self-pay | Admitting: Internal Medicine

## 2013-02-11 VITALS — BP 124/80 | HR 80 | Temp 97.7°F | Wt 170.0 lb

## 2013-02-11 DIAGNOSIS — I10 Essential (primary) hypertension: Secondary | ICD-10-CM

## 2013-02-11 DIAGNOSIS — R05 Cough: Secondary | ICD-10-CM

## 2013-02-11 DIAGNOSIS — F411 Generalized anxiety disorder: Secondary | ICD-10-CM

## 2013-02-11 DIAGNOSIS — R059 Cough, unspecified: Secondary | ICD-10-CM | POA: Insufficient documentation

## 2013-02-11 MED ORDER — OSELTAMIVIR PHOSPHATE 75 MG PO CAPS: 75.0000 mg | ORAL_CAPSULE | Freq: Two times a day (BID) | ORAL | Status: DC

## 2013-02-11 MED ORDER — HYDROCODONE-HOMATROPINE 5-1.5 MG/5ML PO SYRP: 5.0000 mL | ORAL_SOLUTION | Freq: Four times a day (QID) | ORAL | Status: DC | PRN

## 2013-02-11 NOTE — Patient Instructions (Signed)
Please take all new medication as prescribed Please continue all other medications as before, and refills have been done if requested. Please have the pharmacy call with any other refills you may need.  Please remember to sign up for My Chart if you have not done so, as this will be important to you in the future with finding out test results, communicating by private email, and scheduling acute appointments online when needed.   

## 2013-02-11 NOTE — Assessment & Plan Note (Signed)
Mild prod, clear/yellow, feverish at home, mild ST and hoarseness x 2 days- high suspicion for influenza - for tamiflu asd

## 2013-02-11 NOTE — Progress Notes (Signed)
Pre visit review using our clinic review tool, if applicable. No additional management support is needed unless otherwise documented below in the visit note. 

## 2013-02-11 NOTE — Progress Notes (Signed)
Subjective:    Patient ID: Jonathan Orozco, male    DOB: Feb 01, 1929, 80 y.o.   MRN: [id number]  HPI   Here with family, with 2-3 days acute onset fever, Mild prod cough, clear/yellow sputum,  mild ST and hoarseness x 2 days - ? Flu.  Has had headache, general weakness and malaise, but pt denies chest pain, wheezing, increased sob or doe, orthopnea, PND, increased LE swelling, palpitations, dizziness or syncope.   Pt denies polydipsia, polyuria.  Denies worsening depressive symptoms, suicidal ideation, or panic; has ongoing anxiety, not increased recently.  No n/v/d. Past Medical History  Diagnosis Date  . MELANOMA, MALIGNANT, SKIN NOS 09/29/2006  . HYPERLIPIDEMIA 09/29/2006  . ERECTILE DYSFUNCTION 09/29/2006  . 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 ABUSE, ALCOHOL, IN REMISSION 04/08/2007  . Other specified forms of hearing loss 08/10/2009  . HYPERTENSION 09/29/2006  . VENTRICULAR TACHYCARDIA 09/29/2006  . SINUSITIS- ACUTE-NOS 06/23/2007  . ALLERGIC RHINITIS 09/29/2006  . BENIGN PROSTATIC HYPERTROPHY 09/29/2006  . OSTEOARTHROSIS NOS, OTHER [facility] SITE 09/29/2006  . BACK PAIN 09/12/2008  . LEG PAIN, RIGHT 10/02/2008  . VERTIGO 04/08/2007  . COLONIC POLYPS, HX OF 06/23/2007  . CHEST PAIN 07/04/2008  . FATIGUE 11/16/2009  . VIRAL GASTROENTERITIS 01/29/2010  . GLUCOSE INTOLERANCE 03/19/2010  . Atrial flutter 03/28/2010    a. s/p prior rfca;  b. recurrent paroxysmal flutter 04/2012;  c. pradaxa initiated 04/2012.  [name] Long term (current) use of anticoagulants 04/26/2010  . Dysrhythmia, cardiac 2012    atrial flutter   Past Surgical History  Procedure Laterality Date  . Left rotato cuff    . Joint replacement      Right knee  . Tonsillectomy    . Left knee arthroscopy      reports that he has quit smoking. He has never used smokeless tobacco. He reports that he does not drink alcohol or use illicit drugs. family history includes Diabetes in his brother; Esophageal cancer in his father. There is no  history of Colon cancer or Stomach cancer. Allergies  Allergen Reactions  . Ace Inhibitors     REACTION: cough  . Amiodarone Hcl     REACTION: intolerance   Current Outpatient Prescriptions on File Prior to Visit  Medication Sig Dispense Refill  . fish oil-omega-3 fatty acids 1000 MG capsule Take 1 g by mouth daily.        [name] HYDROcodone-acetaminophen (NORCO/VICODIN) 5-325 MG per tablet Take 1 tablet by mouth daily as needed. To fill Feb 13, 2013  30 tablet  0  . MATZIM LA 360 MG 24 hr tablet TAKE 1 TABLET DAILY  90 tablet  2  . Multiple Vitamin (MULTIVITAMIN) tablet Take 1 tablet by mouth daily.        . Probiotic Product (RESTORA) CAPS Take 1 capsule by mouth daily.  10 capsule  0  . warfarin (COUMADIN) 5 MG tablet Take As Directed per Anticoagulation Clinic  150 tablet  1  . citalopram (CELEXA) 10 MG tablet Take 1 tablet (10 mg total) by mouth daily.  90 tablet  3  . LORazepam (ATIVAN) 0.5 MG tablet Take 1 tablet (0.5 mg total) by mouth 2 (two) times daily as needed for anxiety.  60 tablet  1  . metoprolol succinate (TOPROL-XL) 100 MG 24 hr tablet Take 1 tablet (100 mg total) by mouth daily. Take with or immediately following a meal.  90 tablet  3  . Tamsulosin HCl (FLOMAX) 0.4 MG CAPS Take 1 capsule (  0.4 mg total) by mouth daily.  90 capsule  1   No current facility-administered medications on file prior to visit.   Review of Systems  Constitutional: Negative for unexpected weight change, or unusual diaphoresis  HENT: Negative for tinnitus.   Eyes: Negative for photophobia and visual disturbance.  Respiratory: Negative for choking and stridor.   Gastrointestinal: Negative for vomiting and blood in stool.  Genitourinary: Negative for hematuria and decreased urine volume.  Musculoskeletal: Negative for acute joint swelling Skin: Negative for color change and wound.  Neurological: Negative for tremors and numbness other than noted  Psychiatric/Behavioral: Negative for decreased  concentration or  hyperactivity.       Objective:   Physical Exam BP 124/80  Pulse 80  Temp(Src) 97.7 F (36.5 C) (Oral)  Wt 170 lb (77.111 kg) VS noted, mild ill Constitutional: Pt appears well-developed and well-nourished.  HENT: Head: NCAT.  Right Ear: External ear normal.  Left Ear: External ear normal.  Eyes: Conjunctivae and EOM are normal. Pupils are equal, round, and reactive to light.  Bilat tm's with mild erythema.  Max sinus areas mild tender.  Pharynx with mild erythema, no exudate Neck: Normal range of motion. Neck supple.  Cardiovascular: Normal rate and regular rhythm.   Pulmonary/Chest: Effort normal and breath sounds normal.  Neurological: Pt is alert. Not confused , motor 5/5 Skin: Skin is warm. No erythema. No rash Psychiatric: Pt behavior is normal. Thought content normal. mild nervous, not depressed affect    Assessment & Plan:

## 2013-02-13 NOTE — Assessment & Plan Note (Signed)
stable overall by history and exam, recent data reviewed with pt, and pt to continue medical treatment as before,  to f/u any worsening symptoms or concerns Lab Results  Component Value Date   WBC 5.7 05/21/2012   HGB 15.2 05/21/2012   HCT 45.2 05/21/2012   PLT 236.0 05/21/2012   GLUCOSE 100* 05/21/2012   CHOL 166 05/21/2012   TRIG 45.0 05/21/2012   HDL 47.70 05/21/2012   LDLDIRECT 159.2 09/29/2006   LDLCALC 109* 05/21/2012   ALT 16 05/21/2012   AST 20 05/21/2012   NA 139 05/21/2012   K 4.3 05/21/2012   CL 103 05/21/2012   CREATININE 0.9 05/21/2012   BUN 13 05/21/2012   CO2 29 05/21/2012   TSH 0.63 05/21/2012   PSA 1.96 03/16/2009   INR 1.9 01/31/2013   HGBA1C 5.8 03/21/2011

## 2013-02-13 NOTE — Assessment & Plan Note (Signed)
stable overall by history and exam, recent data reviewed with pt, and pt to continue medical treatment as before,  to f/u any worsening symptoms or concerns BP Readings from Last 3 Encounters:  02/11/13 124/80  12/23/12 123/71  12/15/12 132/80

## 2013-02-25 ENCOUNTER — Telehealth: Payer: Self-pay | Admitting: Pharmacist

## 2013-02-25 NOTE — Telephone Encounter (Signed)
Pt's wife called.  Pt went to MD today and is scheduled to have an epidural on 1/28.  Was instructed to start holding Coumadin today.  Pt on Coumadin for afib.  Informed pt's wife our policy is to have clearance by Cardiologist before okaying holding anticoagulation prior to procedure.  If pt wants to hold Coumadin starting today, he will do so at his own risks.  Dr. [name] is not in the office today so will not be able to get clearance until 1/26.  If they want to wait on Dr. Olin Pia input, would need to have procedure rescheduled.  Pt's wife expressed understanding.  Will go ahead and send clearance to Dr. [name] and follow up with her on Monday.

## 2013-02-28 NOTE — Telephone Encounter (Signed)
Spoke with wife who informs me pt has been off Coumadin since Friday. We discussed the risk again of them holding their Coumadin, she voiced understanding. They are proceeding with epidural on Wednesday 1/28. I will inform Dr. [name].

## 2013-02-28 NOTE — Telephone Encounter (Signed)
Dr [name] is aware

## 2013-03-02 ENCOUNTER — Ambulatory Visit (INDEPENDENT_AMBULATORY_CARE_PROVIDER_SITE_OTHER): Payer: Medicare Other | Admitting: Pharmacist Clinician (PhC)/ Clinical Pharmacy Specialist

## 2013-03-02 DIAGNOSIS — I4892 Unspecified atrial flutter: Secondary | ICD-10-CM

## 2013-03-02 LAB — POCT INR: INR: 1

## 2013-03-14 ENCOUNTER — Ambulatory Visit (INDEPENDENT_AMBULATORY_CARE_PROVIDER_SITE_OTHER): Payer: Medicare Other | Admitting: *Deleted

## 2013-03-14 DIAGNOSIS — I4892 Unspecified atrial flutter: Secondary | ICD-10-CM

## 2013-03-14 LAB — POCT INR: INR: 2.6

## 2013-03-17 ENCOUNTER — Telehealth: Payer: Self-pay | Admitting: *Deleted

## 2013-03-17 MED ORDER — DILTIAZEM HCL ER COATED BEADS 360 MG PO TB24: 360.0000 mg | ORAL_TABLET | Freq: Every day | ORAL | Status: DC

## 2013-03-17 NOTE — Telephone Encounter (Signed)
[place] for routine per robin per office policy

## 2013-03-17 NOTE — Telephone Encounter (Signed)
Spouse phoned requesting [facility].  Last OV with PCP 02/11/13.  Please advise.   CB# [phone]

## 2013-03-17 NOTE — Telephone Encounter (Signed)
Refill done.  

## 2013-04-11 ENCOUNTER — Ambulatory Visit (INDEPENDENT_AMBULATORY_CARE_PROVIDER_SITE_OTHER): Payer: Medicare Other | Admitting: *Deleted

## 2013-04-11 DIAGNOSIS — I4892 Unspecified atrial flutter: Secondary | ICD-10-CM

## 2013-04-11 LAB — POCT INR: INR: 2.2

## 2013-05-09 ENCOUNTER — Ambulatory Visit (INDEPENDENT_AMBULATORY_CARE_PROVIDER_SITE_OTHER): Payer: Medicare Other

## 2013-05-09 DIAGNOSIS — I4892 Unspecified atrial flutter: Secondary | ICD-10-CM

## 2013-05-09 LAB — POCT INR: INR: 2.7

## 2013-05-16 ENCOUNTER — Other Ambulatory Visit: Payer: Medicare Other

## 2013-05-18 ENCOUNTER — Telehealth: Payer: Self-pay | Admitting: Internal Medicine

## 2013-05-18 NOTE — Telephone Encounter (Signed)
New message     Patient wife calling. Patient wants to switch over to see Dr. Rayann Heman as a patient.

## 2013-05-18 NOTE — Telephone Encounter (Signed)
F/u   To previous message

## 2013-05-19 NOTE — Telephone Encounter (Addendum)
Pt's wife explains that they would like to transfer to Dr Rayann Heman so that they may remain with one doctor, even when having any procedure done r/t AFib. Dr. Rayann Heman performed procedure in 2010, and offered then for them to switch to him, but pt refused at the time. Heat rate was increased, but doing better at the moment - this is what triggered the thought of possibly another procedure. She states that Dr. [name] is wonderful and they have no issues but they just want to be with one doctor that can be the same for office visit and procedures. Advised that this may take some time, as Dr. [name] is out of the [place] until the end of the month, but will review when he comes back. If they need anything until then, reminded that they are still under Dr. Olin Pia care. Patient verbalized understanding and agreeable to plan.

## 2013-05-20 ENCOUNTER — Emergency Department (HOSPITAL_COMMUNITY): Payer: Medicare Other

## 2013-05-20 ENCOUNTER — Encounter (HOSPITAL_COMMUNITY): Payer: Self-pay | Admitting: Emergency Medicine

## 2013-05-20 ENCOUNTER — Emergency Department (HOSPITAL_COMMUNITY)
Admission: EM | Admit: 2013-05-20 | Discharge: 2013-05-20 | Disposition: A | Discharge status: Home / Self Care | Payer: Medicare Other | Attending: Emergency Medicine | Admitting: Emergency Medicine

## 2013-05-20 DIAGNOSIS — Z860100 Personal history of colon polyps, unspecified: Secondary | ICD-10-CM | POA: Insufficient documentation

## 2013-05-20 DIAGNOSIS — Z87448 Personal history of other diseases of urinary system: Secondary | ICD-10-CM | POA: Insufficient documentation

## 2013-05-20 DIAGNOSIS — Z8639 Personal history of other endocrine, nutritional and metabolic disease: Secondary | ICD-10-CM | POA: Insufficient documentation

## 2013-05-20 DIAGNOSIS — Z8669 Personal history of other diseases of the nervous system and sense organs: Secondary | ICD-10-CM | POA: Insufficient documentation

## 2013-05-20 DIAGNOSIS — R911 Solitary pulmonary nodule: Secondary | ICD-10-CM | POA: Insufficient documentation

## 2013-05-20 DIAGNOSIS — Z79899 Other long term (current) drug therapy: Secondary | ICD-10-CM | POA: Insufficient documentation

## 2013-05-20 DIAGNOSIS — Z85820 Personal history of malignant melanoma of skin: Secondary | ICD-10-CM | POA: Insufficient documentation

## 2013-05-20 DIAGNOSIS — I10 Essential (primary) hypertension: Secondary | ICD-10-CM | POA: Insufficient documentation

## 2013-05-20 DIAGNOSIS — Z862 Personal history of diseases of the blood and blood-forming organs and certain disorders involving the immune mechanism: Secondary | ICD-10-CM | POA: Insufficient documentation

## 2013-05-20 DIAGNOSIS — Z8619 Personal history of other infectious and parasitic diseases: Secondary | ICD-10-CM | POA: Insufficient documentation

## 2013-05-20 DIAGNOSIS — Z87891 Personal history of nicotine dependence: Secondary | ICD-10-CM | POA: Insufficient documentation

## 2013-05-20 DIAGNOSIS — M1990 Unspecified osteoarthritis, unspecified site: Secondary | ICD-10-CM | POA: Insufficient documentation

## 2013-05-20 DIAGNOSIS — Z7901 Long term (current) use of anticoagulants: Secondary | ICD-10-CM | POA: Insufficient documentation

## 2013-05-20 DIAGNOSIS — I4892 Unspecified atrial flutter: Secondary | ICD-10-CM | POA: Insufficient documentation

## 2013-05-20 LAB — BASIC METABOLIC PANEL
BUN: 12 mg/dL (ref 6–23)
CALCIUM: 9.4 mg/dL (ref 8.4–10.5)
CO2: 28 meq/L (ref 19–32)
CREATININE: 0.89 mg/dL (ref 0.50–1.35)
Chloride: 101 mEq/L (ref 96–112)
GFR calc Af Amer: 89 mL/min — ABNORMAL LOW (ref >90)
GFR, EST NON AFRICAN AMERICAN: 77 mL/min — AB (ref >90)
GLUCOSE: 119 mg/dL — AB (ref 70–99)
Potassium: 5 mEq/L (ref 3.7–5.3)
Sodium: 141 mEq/L (ref 137–147)

## 2013-05-20 LAB — CBC
HCT: 45 % (ref 39.0–52.0)
HEMOGLOBIN: 15.5 g/dL (ref 13.0–17.0)
MCH: 32.4 pg (ref 26.0–34.0)
MCHC: 34.4 g/dL (ref 30.0–36.0)
MCV: 93.9 fL (ref 78.0–100.0)
Platelets: 262 10*3/uL (ref 150–400)
RBC: 4.79 MIL/uL (ref 4.22–5.81)
RDW: 13.3 % (ref 11.5–15.5)
WBC: 7.1 10*3/uL (ref 4.0–10.5)

## 2013-05-20 LAB — PROTIME-INR
INR: 2.18 — ABNORMAL HIGH (ref 0.00–1.49)
Prothrombin Time: 23.6 seconds — ABNORMAL HIGH (ref 11.6–15.2)

## 2013-05-20 LAB — TROPONIN I: Troponin I: <0.3 ng/mL (ref <0.30)

## 2013-05-20 MED ORDER — DILTIAZEM HCL 25 MG/5ML IV SOLN: 5.0000 mg | Freq: Once | INTRAVENOUS | Status: DC

## 2013-05-20 MED ORDER — DILTIAZEM HCL 100 MG IV SOLR
5.0000 mg/h | INTRAVENOUS | Status: DC
  Administered 2013-05-20 (×2): 5 mg/h via INTRAVENOUS

## 2013-05-20 MED ORDER — ETOMIDATE 2 MG/ML IV SOLN
10.0000 mg | Freq: Once | INTRAVENOUS | Status: AC
  Filled 2013-05-20: qty 10

## 2013-05-20 MED ORDER — DILTIAZEM LOAD VIA INFUSION
10.0000 mg | Freq: Once | INTRAVENOUS | Status: AC
  Administered 2013-05-20: 10 mg via INTRAVENOUS
  Filled 2013-05-20: qty 10

## 2013-05-20 MED ORDER — ETOMIDATE 2 MG/ML IV SOLN
INTRAVENOUS | Status: AC | PRN
  Administered 2013-05-20: 10 mg via INTRAVENOUS

## 2013-05-20 NOTE — ED Notes (Signed)
Pt c/o heart racing x 3-4 days. Pt has a watch that keep track of heart rate and reprots that it has been as high as 152. Current reading was 131. Pt reports shortness of breath and feeling dizzy.

## 2013-05-20 NOTE — ED Notes (Signed)
PT shocked at 100 joules and converted to NS with PAC's

## 2013-05-20 NOTE — Discharge Instructions (Signed)
Atrial Flutter Atrial flutter is a heart rhythm that can cause the heart to beat very fast (tachycardia). It originates in the upper chambers of the heart (atria). In atrial flutter, the top chambers of the heart (atria) often beat much faster than the bottom chambers of the heart (ventricles). Atrial flutter has a regular "saw toothed" appearance in an EKG readout. An EKG is a test that records the electrical activity of the heart. Atrial flutter can cause the heart to beat up to 150 beats per minute (BPM). Atrial flutter can either be short lived (paroxysmal) or permanent.  CAUSES  Causes of atrial flutter can be many. Some of these include:  Heart related issues:  Heart attack (myocardial infarction).  Heart failure.  Heart valve problems.  Poorly controlled high blood pressure (hypertension).  Afteropen heart surgery.  Lung related issues:  A blood clot in the lungs (pulmonary embolism).  Chronic obstructive pulmonary disease (COPD). Medications used to treat COPD can attribute to atrial flutter.  Other related causes:  Hyperthyroidism.  Caffeine.  Some decongestant cold medications.  Low electrolyte levels such as potassium or magnesium.  Cocaine. SYMPTOMS  An awareness of your heart beating rapidly (palpitations).  Shortness of breath.  Chest pain.  Low blood pressure (hypotension).  Dizziness or fainting. DIAGNOSIS  Different tests can be performed to diagnose atrial flutter.   An EKG.  Holter monitor. This is a 24 hour recording of your heart rhythm. You will also be given a diary. Write down all symptoms that you have and what you were doing at the time you experienced symptoms.  Cardiac event monitor. This small device can be worn for up to 30 days. When you have heart symptoms, you will push a button on the device. This will then record your heart rhythm.  Echocardiogram. This is an imaging test to look at your heart. Your caregiver will look at your  heart valves and the ventricles.  Stress Test. This test can help determine if the atrial flutter is related to exercise or if coronary artery disease is present.  Laboratory studies will look at certain blood levels like:  Complete blood count (CBC).  Potassium.  Magnesium.  Thyroid function. TREATMENT  Treatment of atrial flutter varies. A combination of therapies may be used or sometimes atrial flutter may need only 1 type of treatment.  Lab work: If your blood work, such as your electrolytes (potassium, magnesium) or your thyroid function tests are abnormal, your caregiver will treat them accordingly.  Medication:  There are several different types of medications that can convert your heart to a normal rhythm and prevent atrial flutter from reoccurring.  Nonsurgical procedures: Nonsurgical techniques may be used to control atrial flutter. Some examples include:  Cardioversion. This technique uses either drugs or an electrical shock to restore a normal heart rhythm:  Cardioversion drugs may be given through an intravenous (IV) line to help "reset" the heart rhythm.  In electrical cardioversion, your caregiver shocks your heart with electrical energy. This helps to reset the heartbeat to a normal rhythm.  Ablation. If atrial flutter is a persistent problem, an ablation may be needed. This procedure is done under mild sedation. High frequency radio-wave energy is used to destroy the area of heart tissue responsible for atrial flutter. SEEK IMMEDIATE MEDICAL CARE IF:   Dizziness.  Near fainting or fainting.  Shortness of breath.  Chest pain or pressure.  Sudden nausea or vomiting.  Profuse sweating. If you have the above symptoms, call your  local emergency service immediately! Do not drive yourself to the hospital. MAKE SURE YOU:   Understand these instructions.  Will watch your condition.  Will get help right away if you are not doing well or get worse. Document  Released: 06/08/2008 Document Revised: 04/14/2011 Document Reviewed: 06/08/2008 [facility] Patient Information 2014 [place], [place].  Incidental Abnormal Radiological Finding An incidental abnormal radiologic finding is a very small mass or scar tissue, detected anywhere in the body, unrelated to the reason for your visit. With newer imaging tests and technologies, it is becoming more common to detect small masses and tissue abnormalities. It is important for you to work with your caregiver because it can sometimes be related to undiagnosed illness or other symptoms. Most often however, the finding is not causing symptoms and is not a cause for concern. TYPES OF FINDINGS Abnormal radiologic findings are often located in the kidneys or lungs, but they can also be found in the heart, liver, breasts, brain, gallbladder, uterus and other surrounding organs and tissues. There are many types of masses and tissue abnormalities that can be detected during an imaging test. These may include:  Lesions  changes in tissue due to infection, tissue death, or trauma.  Cysts a sac filled with fluid, crystals, or some other substance.  Tumors non-cancerous or cancerous solid formation. You may hear medical terms, such as, "pulmonary nodule" (a small mass in the lung) or "renal mass" (mass in the kidney). Ask your caregiver if these terms apply to your findings.  DO I NEED FURTHER DIAGNOSIS? There are many possible causes of incidental radiologic findings. Your caregiver will determine whether it requires additional screening tests, diagnostic tests, treatments, or referral to a surgeon.Generally, very small tissue changes or masses will not require any follow up testing. Much research has been done in this area.These very small abnormalities are considered low risk of becoming a problem in the future.  Depending on the size and appearance of the finding, your caregiver may recommend additional testing.Additional  testing may also be recommended if you have certain risk factors or medical conditions that increase your risk of related problems. It is a good idea to have additional testing if you have other symptoms or concerns. Sometimes, these early findings can give you a chance at early treatment and avoid problems in the future. TESTING AND DIAGNOSIS Tests and exams may be a one-time screening or periodic follow-up. Periodic follow-up will help your caregiver determine whether the abnormality is growing and becoming a concern. Tests may include:  Physical examination.  Blood tests.  Urine tests.  Imaging tests, such as abdominal ultrasound, CT scan, or MRI.  Biopsy. TREATMENT Treatment varies, depending on the cause, location, size and appearance of the finding. Treatment will also depend on your age and underlying conditions or symptoms. Sometimes treatment is not necessary at all. However, treatment may include:  Watchful waiting with periodic examination and testing.  Treatments to reduce the size of the abnormality.  Surgical or biopsy removal of the abnormality.  Additional treatments to address any underlying conditions. HOME CARE INSTRUCTIONS   See your caregiver for follow up examination and testing as directed. It is important that you schedule appointments as directed.  Keep calm. Your caregiver will let you know if this is a routine follow up, or if there is a reason for concern. Remember, early detection can be very beneficial to you.  Follow all of your caregiver's aftercare instructions related to the reason for your visit. Document Released: 05/07/2010  Document Revised: 04/14/2011 Document Reviewed: 05/07/2010 [facility] Patient Information 2014 [place].

## 2013-05-20 NOTE — Telephone Encounter (Signed)
Follow up    Pt says his heart rate is 133--

## 2013-05-20 NOTE — Telephone Encounter (Signed)
Spoke with patient's wife. Patient in background talking as well.    She states patient has hx of AFIB/Flutter.  Patient's HR maintaining around 130s-140s/minute since Tuesday.   He is taking Cardizem 360 daily and has Toprol XL 100 mg daily ordered but does not use this everyday.  Has taken it everyday since he felt the increased rhythm/arrythmia on Tuesday.  He reports being anxious, nervous, tired and SOB.  I directed him to [facility] ED to be evaluated/treated.  They verbalize understanding/agreement.  Trish, Engineer, maintenance (IT), informed by phone.

## 2013-05-20 NOTE — CV Procedure (Signed)
     DIRECT CURRENT CARDIOVERSION  NAME:  Jonathan Orozco   MRN: [id number] DOB:  03/02/1928   ADMIT DATE: 05/20/2013   INDICATIONS: Atrial flutter   PROCEDURE:   Informed consent was obtained prior to the procedure. The risks, benefits and alternatives for the procedure were discussed and the patient comprehended these risks. Once an appropriate time out was taken, the patient had the defibrillator pads placed in the anterior and posterior position. The patient then underwent sedation by the Dr. Alvino Chapel in the ER. Once an appropriate level of sedation was achieved, the patient received a single biphasic, synchronized 100J shock with prompt conversion to sinus rhythm with PAcs. No apparent complications.   CONLCUSION:   1.  Successful DC-CV of recurrent AFL  Can go home from ER with EP follow-up. Continue coumadin.   Jonathan Pascal Emerald Shor,MD 2:16 PM

## 2013-05-20 NOTE — H&P (Signed)
Patient ID: SHAHEIM MAHAR MRN: [id number], DOB/AGE: [birth year]   Admit date: 05/20/2013   Primary Physician: [name], MD Primary Cardiologist: Dr. [name]  Pt. Profile: Jonathan Orozco is a 80 y.o. male with a history of HTN, ventricular tachycardia, outflow tract ectopy and paroxysmal atrial flutter on coumadin and no previous CAD who presents to the ED complaining of palpitations, SOB and dizziness.    HPI: He has paroxysmal atrial flutter for which he underwent EP testing and 3-D mapping which failed to produce stable arrhythmias and medical therapy was undertaken. In 04/2012 he was hospitalized because of rapid atrial flutter; TEE cardioversion was anticipated but he reverted spontaneously. He was discharged home on rate control with diltiazem and metoprolol. He failed amiodarone d/t photosensitivity. He routinely takes coumadin and INRS have been therapeutic since 03/14/13. He complains of his "heart racing" for 3-4 days. Her has a watch that keeps track of heart rate and reports that it has been as high as 152. He also complains of dizziness and SOB.    Problem List  Past Medical History  Diagnosis Date  . MELANOMA, MALIGNANT, SKIN NOS 09/29/2006  . HYPERLIPIDEMIA 09/29/2006  . ERECTILE DYSFUNCTION 09/29/2006  . 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 ABUSE, ALCOHOL, IN REMISSION 04/08/2007  . Other specified forms of hearing loss 08/10/2009  . HYPERTENSION 09/29/2006  . VENTRICULAR TACHYCARDIA 09/29/2006  . SINUSITIS- ACUTE-NOS 06/23/2007  . ALLERGIC RHINITIS 09/29/2006  . BENIGN PROSTATIC HYPERTROPHY 09/29/2006  . OSTEOARTHROSIS NOS, OTHER [place] SITE 09/29/2006  . BACK PAIN 09/12/2008  . LEG PAIN, RIGHT 10/02/2008  . VERTIGO 04/08/2007  . COLONIC POLYPS, HX OF 06/23/2007  . CHEST PAIN 07/04/2008  . FATIGUE 11/16/2009  . VIRAL GASTROENTERITIS 01/29/2010  . GLUCOSE INTOLERANCE 03/19/2010  . Atrial flutter 03/28/2010    a. s/p prior rfca;  b. recurrent paroxysmal flutter 04/2012;  c.  pradaxa initiated 04/2012.  [name] Long term (current) use of anticoagulants 04/26/2010  . Dysrhythmia, cardiac 2012    atrial flutter    Past Surgical History  Procedure Laterality Date  . Left rotato cuff    . Joint replacement      Right knee  . Tonsillectomy    . Left knee arthroscopy       Allergies  Allergies  Allergen Reactions  . Ace Inhibitors     REACTION: cough  . Amiodarone Hcl     REACTION: intolerance     Home Medications  Prior to Admission medications   Medication Sig Start Date End Date Taking? Authorizing Provider  Calcium Carb-Cholecalciferol (CALCIUM 600 + D PO) Take 1 tablet by mouth daily.   Yes Historical Provider, MD  diltiazem (MATZIM LA) 360 MG 24 hr tablet Take 1 tablet (360 mg total) by mouth daily. 03/17/13  Yes Biagio Borg, MD  fish oil-omega-3 fatty acids 1000 MG capsule Take 1 g by mouth daily.    Yes Historical Provider, MD  HYDROcodone-acetaminophen (NORCO/VICODIN) 5-325 MG per tablet Take 1 tablet by mouth daily as needed. To fill Feb 13, 2013 12/15/12  Yes Biagio Borg, MD  LORazepam (ATIVAN) 0.5 MG tablet Take 1 tablet (0.5 mg total) by mouth 2 (two) times daily as needed for anxiety. 04/27/12  Yes Biagio Borg, MD  Magnesium 400 MG CAPS Take 1 capsule by mouth daily.   Yes Historical Provider, MD  metoprolol succinate (TOPROL-XL) 100 MG 24 hr tablet Take 1 tablet (100 mg total) by mouth daily. Take with or  immediately following a meal. 04/29/12  Yes Deboraha Sprang, MD  Multiple Vitamin (MULTIVITAMIN) tablet Take 1 tablet by mouth daily.     Yes Historical Provider, MD  Probiotic Product (RESTORA) CAPS Take 1 capsule by mouth daily. 12/08/12  Yes Irene Shipper, MD  warfarin (COUMADIN) 5 MG tablet Take 5-7.5 mg by mouth daily. Takes 5 mg on Thursday and 7.5 mg all other days 02/09/13   Deboraha Sprang, MD    Family History  Family History  Problem Relation Age of Onset  . Diabetes Brother   . Esophageal cancer Father   . Colon cancer Neg Hx   .  Stomach cancer Neg Hx     Social History  History   Social History  . Marital Status: Married    Spouse Name: N/A    Number of Children: N/A  . Years of Education: N/A   Occupational History  . Not on file.   Social History Main Topics  . Smoking status: Former Research scientist (life sciences)  . Smokeless tobacco: Never Used     Comment: Quit smoking and drinking 20 years ago  . Alcohol Use: No  . Drug Use: No  . Sexual Activity: Not on file   Other Topics Concern  . Not on file   Social History Narrative   Married, 3 children. Retired Press photographer. Lives in [place] with  wife and kids. He is retired from Press photographer.      Review of Systems General:  No chills, fever, night sweats or weight changes.  Cardiovascular:  No chest pain, dyspnea on exertion, edema, orthopnea, palpitations, paroxysmal nocturnal dyspnea. Dermatological: No rash, lesions/masses Respiratory: No cough, dyspnea Urologic: No hematuria, dysuria Abdominal:   No nausea, vomiting, diarrhea, bright red blood per rectum, melena, or hematemesis Neurologic:  No visual changes, wkns, changes in mental status. All other systems reviewed and are otherwise negative except as noted above.  Physical Exam  Blood pressure 106/76, pulse 132, temperature 0 F (-17.8 C), resp. rate 9, height 6\' 2"  (1.88 m), weight 165 lb (74.844 kg), SpO2 100.00%.  General: Pleasant, NAD Psych: Normal affect. Neuro: Alert and oriented X 3. Moves all extremities spontaneously. HEENT: Normal  Neck: Supple without bruits or JVD. Lungs:  Resp regular and unlabored, CTA. Heart:  tachy and regular Abdomen: Soft, non-tender, non-distended, BS + x 4.  Extremities: No clubbing, cyanosis or edema. DP/PT/Radials 2+ and equal bilaterally.  Labs   Recent Labs  05/20/13 1200  TROPONINI <0.30   Lab Results  Component Value Date   WBC 7.1 05/20/2013   HGB 15.5 05/20/2013   HCT 45.0 05/20/2013   MCV 93.9 05/20/2013   PLT 262 05/20/2013    Recent Labs Lab 05/20/13 1111    NA 141  K 5.0  CL 101  CO2 28  BUN 12  CREATININE 0.89  CALCIUM 9.4  GLUCOSE 119*      Radiology/Studies  Dg Chest Port 1 View  05/20/2013   CLINICAL DATA:  Shortness of breath  EXAM: PORTABLE CHEST - 1 VIEW  COMPARISON:  04/24/2012  FINDINGS: Cardiac shadow is mildly enlarged. The lungs are well aerated bilaterally. Increased density is noted right lung base with some apparent volume loss in the middle lobe suggestive of early infiltrate. Two-view chest may be helpful for further evaluation. No other focal abnormality is seen.  IMPRESSION: Changes suggestive of right middle lobe atelectasis/ early infiltrate.   Electronically Signed   By: Inez Catalina M.D.   On: 05/20/2013 13:11  ECHO 2010 SUMMARY - Overall left ventricular systolic function was normal. Left ventricular ejection fraction was estimated to be 65 %. There were no left ventricular regional wall motion abnormalities. There was mild focal basal septal hypertrophy. - Aortic valve thickness was mildly increased. - There was mild mitral valvular regurgitation. - The left atrium was mildly dilated.   ECG  A flutter HR 134 RBBB  ASSESSMENT AND PLAN KNOLAN SIMIEN is a 80 y.o. male with a history of HTN, ventricular tachycardia, outflow tract ectopy and paroxysmal atrial flutter on coumadin and no previous CAD who presents to the ED complaining of palpitations, SOB and dizziness.    Atrial flutter with RBBB HR 134 -- Continue diltiazem and metoprolol.  -- He failed amiodarone d/t photosensitivity. -- He is complaint on coumadin and INRS have been therapeutic since 03/14/13. Will proceed with DCCV. INR 2.18 today.  Signed, Perry Mount, PA-C 05/20/2013, 1:34 PM  Pager [phone]  Patient seen and examined with Lorretta Harp, PA-C. We discussed all aspects of the encounter. I agree with the assessment and plan as stated above.   Patient with recurrent AFL vs Atach with RVR which is symptomatic. Last episode > 1 year  ago. INRs reviewed all normal. EF normal in 2010. Will plan DC-CV in ER with d/c home after. I have contacted EP to discussed possibility of AA therapy vs re-evaluation of for RFA. Has failed amio in past. They will arrange for early f/u with Dr. Odenton Sink Pasha Broad,MD 2:08 PM

## 2013-05-20 NOTE — ED Provider Notes (Signed)
CSN: [id number]     Arrival date & time 05/20/13  66 History   First MD Initiated Contact with Patient 05/20/13 1147     Chief Complaint  Patient presents with  . Tachycardia     (Consider location/radiation/quality/duration/timing/severity/associated sxs/prior Treatment) The history is provided by the patient.   patient has a history of atrial flutter. He has had a fast heart rate for the last 3-4 days has felt somewhat short of breath. No chest pain. No fevers. No cough. He is on Coumadin. He is on Cardizem and has had all the doses, although he did miss one had to catch up with him later. No fevers. No cough. No swelling in his legs. There has been an attempted ablation in the past, but states they were not able to do it due to scar tissue in his heart.  Past Medical History  Diagnosis Date  . MELANOMA, MALIGNANT, SKIN NOS 09/29/2006  . HYPERLIPIDEMIA 09/29/2006  . ERECTILE DYSFUNCTION 09/29/2006  . 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 ABUSE, ALCOHOL, IN REMISSION 04/08/2007  . Other specified forms of hearing loss 08/10/2009  . HYPERTENSION 09/29/2006  . VENTRICULAR TACHYCARDIA 09/29/2006  . SINUSITIS- ACUTE-NOS 06/23/2007  . ALLERGIC RHINITIS 09/29/2006  . BENIGN PROSTATIC HYPERTROPHY 09/29/2006  . OSTEOARTHROSIS NOS, OTHER [facility] SITE 09/29/2006  . BACK PAIN 09/12/2008  . LEG PAIN, RIGHT 10/02/2008  . VERTIGO 04/08/2007  . COLONIC POLYPS, HX OF 06/23/2007  . CHEST PAIN 07/04/2008  . FATIGUE 11/16/2009  . VIRAL GASTROENTERITIS 01/29/2010  . GLUCOSE INTOLERANCE 03/19/2010  . Atrial flutter 03/28/2010    a. s/p prior rfca;  b. recurrent paroxysmal flutter 04/2012;  c. pradaxa initiated 04/2012.  [name] Long term (current) use of anticoagulants 04/26/2010  . Dysrhythmia, cardiac 2012    atrial flutter   Past Surgical History  Procedure Laterality Date  . Left rotato cuff    . Joint replacement      Right knee  . Tonsillectomy    . Left knee arthroscopy     Family History  Problem  Relation Age of Onset  . Diabetes Brother   . Esophageal cancer Father   . Colon cancer Neg Hx   . Stomach cancer Neg Hx    History  Substance Use Topics  . Smoking status: Former Research scientist (life sciences)  . Smokeless tobacco: Never Used     Comment: Quit smoking and drinking 20 years ago  . Alcohol Use: No    Review of Systems  Constitutional: Negative for activity change and appetite change.  Eyes: Negative for pain.  Respiratory: Positive for shortness of breath. Negative for chest tightness.   Cardiovascular: Positive for palpitations. Negative for chest pain and leg swelling.  Gastrointestinal: Negative for nausea, vomiting, abdominal pain and diarrhea.  Genitourinary: Negative for flank pain.  Musculoskeletal: Negative for back pain and neck stiffness.  Skin: Negative for rash.  Neurological: Negative for weakness, numbness and headaches.  Psychiatric/Behavioral: Negative for behavioral problems.      Allergies  Ace inhibitors and Amiodarone hcl  Home Medications   Prior to Admission medications   Medication Sig Start Date End Date Taking? Authorizing Provider  Calcium Carb-Cholecalciferol (CALCIUM 600 + D PO) Take 1 tablet by mouth daily.   Yes Historical Provider, MD  diltiazem (MATZIM LA) 360 MG 24 hr tablet Take 1 tablet (360 mg total) by mouth daily. 03/17/13  Yes Biagio Borg, MD  fish oil-omega-3 fatty acids 1000 MG capsule Take 1 g by mouth daily.  Yes Historical Provider, MD  HYDROcodone-acetaminophen (NORCO/VICODIN) 5-325 MG per tablet Take 1 tablet by mouth daily as needed. To fill Feb 13, 2013 12/15/12  Yes Biagio Borg, MD  LORazepam (ATIVAN) 0.5 MG tablet Take 1 tablet (0.5 mg total) by mouth 2 (two) times daily as needed for anxiety. 04/27/12  Yes Biagio Borg, MD  Magnesium 400 MG CAPS Take 1 capsule by mouth daily.   Yes Historical Provider, MD  metoprolol succinate (TOPROL-XL) 100 MG 24 hr tablet Take 1 tablet (100 mg total) by mouth daily. Take with or immediately  following a meal. 04/29/12  Yes Deboraha Sprang, MD  Multiple Vitamin (MULTIVITAMIN) tablet Take 1 tablet by mouth daily.     Yes Historical Provider, MD  Probiotic Product (RESTORA) CAPS Take 1 capsule by mouth daily. 12/08/12  Yes Irene Shipper, MD  warfarin (COUMADIN) 5 MG tablet Take 5-7.5 mg by mouth daily. Takes 5 mg on Thursday and 7.5 mg all other days 02/09/13   Deboraha Sprang, MD   BP 128/71  Pulse 79  Temp(Src) 98.1 F (36.7 C) (Oral)  Resp 16  Ht 6\' 2"  (1.88 m)  Wt 165 lb (74.844 kg)  BMI 21.18 kg/m2  SpO2 94% Physical Exam  Nursing note and vitals reviewed. Constitutional: He is oriented to person, place, and time. He appears well-developed and well-nourished.  HENT:  Head: Normocephalic and atraumatic.  Eyes: EOM are normal. Pupils are equal, round, and reactive to light.  Neck: Normal range of motion. Neck supple.  Cardiovascular: Regular rhythm and normal heart sounds.   No murmur heard. Tachycardia  Pulmonary/Chest: Effort normal and breath sounds normal.  Abdominal: Soft. Bowel sounds are normal. He exhibits no distension and no mass. There is no tenderness. There is no rebound and no guarding.  Musculoskeletal: Normal range of motion. He exhibits no edema.  Neurological: He is alert and oriented to person, place, and time. No cranial nerve deficit.  Skin: Skin is warm and dry.  Psychiatric: He has a normal mood and affect.    ED Course  Procedural sedation Date/Time: 05/20/2013 5:34 PM Performed by: Davonna Belling R. Authorized by: Mackie Pai Consent: Verbal consent obtained. written consent obtained. Risks and benefits: risks, benefits and alternatives were discussed Consent given by: patient Patient understanding: patient states understanding of the procedure being performed Patient consent: the patient's understanding of the procedure matches consent given Procedure consent: procedure consent matches procedure scheduled Relevant documents:  relevant documents present and verified Test results: test results available and properly labeled Site marked: the operative site was marked Imaging studies: imaging studies available Required items: required blood products, implants, devices, and special equipment available Patient identity confirmed: verbally with patient and arm band Local anesthesia used: no Patient sedated: yes Sedation type: moderate (conscious) sedation Sedatives: etomidate Vitals: Vital signs were monitored during sedation. (5 minutes of sedation)   (including critical care time) Labs Review Labs Reviewed  BASIC METABOLIC PANEL - Abnormal; Notable for the following:    Glucose, Bld 119 (*)    GFR calc non Af Amer 77 (*)    GFR calc Af Amer 89 (*)    All other components within normal limits  PROTIME-INR - Abnormal; Notable for the following:    Prothrombin Time 23.6 (*)    INR 2.18 (*)    All other components within normal limits  CBC  TROPONIN I    Imaging Review Dg Chest 2 View  05/20/2013   CLINICAL DATA:  Shortness of Breath  EXAM: CHEST  2 VIEW  COMPARISON:  Study obtained earlier in the day and April 24, 2012  FINDINGS: There is a degree of underlying emphysematous change. There is a rather subtle 9 x 7 mm opacity in the right upper lobe not present on the 2014 study. Elsewhere lungs are clear. Heart size and pulmonary vascularity are normal. No adenopathy. There is anterior wedging of a lower thoracic vertebral body. There is calcification in the left anterior descending coronary artery.  IMPRESSION: Subtle nodular opacity right upper lobe. Advise noncontrast enhanced chest CT to further assess. No edema or consolidation. Underlying emphysema. There is coronary artery calcification.   Electronically Signed   By: Lowella Grip M.D.   On: 05/20/2013 15:42   Dg Chest Port 1 View  05/20/2013   CLINICAL DATA:  Shortness of breath  EXAM: PORTABLE CHEST - 1 VIEW  COMPARISON:  04/24/2012  FINDINGS: Cardiac  shadow is mildly enlarged. The lungs are well aerated bilaterally. Increased density is noted right lung base with some apparent volume loss in the middle lobe suggestive of early infiltrate. Two-view chest may be helpful for further evaluation. No other focal abnormality is seen.  IMPRESSION: Changes suggestive of right middle lobe atelectasis/ early infiltrate.   Electronically Signed   By: Inez Catalina M.D.   On: 05/20/2013 13:11     EKG Interpretation   Date/Time:  Friday May 20 2013 10:55:40 EDT Ventricular Rate:  134 PR Interval:    QRS Duration: 132 QT Interval:  356 QTC Calculation: 531 R Axis:   49 Text Interpretation:  Wide QRS tachycardia Right bundle branch block  Abnormal ECG Atrial flutter with 2 to 1 block Reconfirmed by Sybil Shrader   MD, Ovid Curd [phone]) on 05/20/2013 12:12:33 PM      MDM   Final diagnoses:  Atrial flutter  Lung nodule seen on imaging study    Patient with shortness of breath related to atrial flutter with RVR. History of same. He is on anticoagulation already. Cardiology is seeing the patient and cardioversion was done in the ER. I did the sedation. Patient was discharged home follow with his cardiologist.      Jasper Riling. Alvino Chapel, MD 05/21/13 1735

## 2013-05-23 ENCOUNTER — Encounter: Payer: Medicare Other | Admitting: Internal Medicine

## 2013-05-24 ENCOUNTER — Encounter: Payer: Self-pay | Admitting: Internal Medicine

## 2013-05-24 ENCOUNTER — Ambulatory Visit (INDEPENDENT_AMBULATORY_CARE_PROVIDER_SITE_OTHER): Payer: Medicare Other | Admitting: Internal Medicine

## 2013-05-24 ENCOUNTER — Other Ambulatory Visit (INDEPENDENT_AMBULATORY_CARE_PROVIDER_SITE_OTHER): Payer: Medicare Other

## 2013-05-24 VITALS — BP 126/70 | HR 62 | Temp 97.4°F | Wt 171.0 lb

## 2013-05-24 DIAGNOSIS — R911 Solitary pulmonary nodule: Secondary | ICD-10-CM

## 2013-05-24 DIAGNOSIS — Z79899 Other long term (current) drug therapy: Secondary | ICD-10-CM

## 2013-05-24 DIAGNOSIS — E785 Hyperlipidemia, unspecified: Secondary | ICD-10-CM

## 2013-05-24 DIAGNOSIS — Z23 Encounter for immunization: Secondary | ICD-10-CM

## 2013-05-24 DIAGNOSIS — Z85820 Personal history of malignant melanoma of skin: Secondary | ICD-10-CM

## 2013-05-24 DIAGNOSIS — Z0000 Encounter for general adult medical examination without abnormal findings: Secondary | ICD-10-CM

## 2013-05-24 DIAGNOSIS — Z87891 Personal history of nicotine dependence: Secondary | ICD-10-CM

## 2013-05-24 LAB — HEPATIC FUNCTION PANEL
ALT: 21 U/L (ref 0–53)
AST: 21 U/L (ref 0–37)
Albumin: 4 g/dL (ref 3.5–5.2)
Alkaline Phosphatase: 105 U/L (ref 39–117)
BILIRUBIN DIRECT: 0.1 mg/dL (ref 0.0–0.3)
Total Bilirubin: 0.8 mg/dL (ref 0.3–1.2)
Total Protein: 7 g/dL (ref 6.0–8.3)

## 2013-05-24 LAB — LIPID PANEL
CHOL/HDL RATIO: 3
Cholesterol: 181 mg/dL (ref 0–200)
HDL: 55.4 mg/dL (ref >39.00)
LDL Cholesterol: 118 mg/dL — ABNORMAL HIGH (ref 0–99)
TRIGLYCERIDES: 39 mg/dL (ref 0.0–149.0)
VLDL: 7.8 mg/dL (ref 0.0–40.0)

## 2013-05-24 LAB — URINALYSIS, ROUTINE W REFLEX MICROSCOPIC
BILIRUBIN URINE: NEGATIVE
Hgb urine dipstick: NEGATIVE
KETONES UR: NEGATIVE
Leukocytes, UA: NEGATIVE
NITRITE: NEGATIVE
PH: 7 (ref 5.0–8.0)
Specific Gravity, Urine: 1.015 (ref 1.000–1.030)
TOTAL PROTEIN, URINE-UPE24: NEGATIVE
URINE GLUCOSE: NEGATIVE
Urobilinogen, UA: 0.2 (ref 0.0–1.0)

## 2013-05-24 LAB — HEMOGLOBIN A1C: Hgb A1c MFr Bld: 5.5 % (ref 4.6–6.5)

## 2013-05-24 LAB — TSH: TSH: 0.76 u[IU]/mL (ref 0.35–5.50)

## 2013-05-24 MED ORDER — HYDROCODONE-ACETAMINOPHEN 5-325 MG PO TABS: 1.0000 | ORAL_TABLET | Freq: Every day | ORAL | Status: DC | PRN

## 2013-05-24 NOTE — Assessment & Plan Note (Signed)
For ct chest, no CM, hx of smoking, hx of melanoma

## 2013-05-24 NOTE — Assessment & Plan Note (Signed)

## 2013-05-24 NOTE — Patient Instructions (Addendum)
You had the new Prevnar pneumonia shot today  Please continue all other medications as before, and refills have been done if requested - the hydrocodone  Please have the pharmacy call with any other refills you may need.  Please continue your efforts at being more active, low cholesterol diet, and weight control. You are otherwise up to date with prevention measures today.  Please go to the LAB in the Basement (turn left off the elevator) for the tests to be done today You will be contacted by phone if any changes need to be made immediately.  Otherwise, you will receive a letter about your results with an explanation, but please check with MyChart first.  You will be contacted regarding the referral for: CT chest  Please keep your appointments with your specialists as you have planned  - dermatology  Please return in 1 year for your yearly visit, or sooner if needed

## 2013-05-24 NOTE — Progress Notes (Signed)
Subjective:    Patient ID: Jonathan Orozco, male    DOB: [birth year], 80 y.o.   MRN: [id number]  HPI  Here for wellness and f/u;  Overall doing ok;  Pt denies CP, worsening SOB, DOE, wheezing, orthopnea, PND, worsening LE edema, palpitations, dizziness or syncope.  Pt denies neurological change such as new headache, facial or extremity weakness.  Pt denies polydipsia, polyuria, or low sugar symptoms. Pt states overall good compliance with treatment and medications, good tolerability, and has been trying to follow lower cholesterol diet.  Pt denies worsening depressive symptoms, suicidal ideation or panic. No fever, night sweats, wt loss, loss of appetite, or other constitutional symptoms.  Pt states good ability with ADL's, has low fall risk, home safety reviewed and adequate, no other significant changes in hearing or vision, and only occasionally active with exercise.  Stands up with minimal pushing off. Had incidental pulm nodule right lung on cxr apr 17.   Brother and sister had lung cancer.  Former smoker 1 ppd for 10 yrs, quit mid 1970's.  Sees Dr Denna Haggard for hx melanoma upper back. Past Medical History  Diagnosis Date  . MELANOMA, MALIGNANT, SKIN NOS 09/29/2006  . HYPERLIPIDEMIA 09/29/2006  . ERECTILE DYSFUNCTION 09/29/2006  . 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 ABUSE, ALCOHOL, IN REMISSION 04/08/2007  . Other specified forms of hearing loss 08/10/2009  . HYPERTENSION 09/29/2006  . VENTRICULAR TACHYCARDIA 09/29/2006  . SINUSITIS- ACUTE-NOS 06/23/2007  . ALLERGIC RHINITIS 09/29/2006  . BENIGN PROSTATIC HYPERTROPHY 09/29/2006  . OSTEOARTHROSIS NOS, OTHER [facility] SITE 09/29/2006  . BACK PAIN 09/12/2008  . LEG PAIN, RIGHT 10/02/2008  . VERTIGO 04/08/2007  . COLONIC POLYPS, HX OF 06/23/2007  . CHEST PAIN 07/04/2008  . FATIGUE 11/16/2009  . VIRAL GASTROENTERITIS 01/29/2010  . GLUCOSE INTOLERANCE 03/19/2010  . Atrial flutter 03/28/2010    a. s/p prior rfca;  b. recurrent paroxysmal flutter 04/2012;  c.  pradaxa initiated 04/2012.  [name] Long term (current) use of anticoagulants 04/26/2010  . Dysrhythmia, cardiac 2012    atrial flutter   Past Surgical History  Procedure Laterality Date  . Left rotato cuff    . Joint replacement      Right knee  . Tonsillectomy    . Left knee arthroscopy      reports that he has quit smoking. He has never used smokeless tobacco. He reports that he does not drink alcohol or use illicit drugs. family history includes Diabetes in his brother; Esophageal cancer in his father. There is no history of Colon cancer or Stomach cancer. Allergies  Allergen Reactions  . Ace Inhibitors     REACTION: cough  . Amiodarone Hcl     REACTION: intolerance   Current Outpatient Prescriptions on File Prior to Visit  Medication Sig Dispense Refill  . Calcium Carb-Cholecalciferol (CALCIUM 600 + D PO) Take 1 tablet by mouth daily.      [name] diltiazem (MATZIM LA) 360 MG 24 hr tablet Take 1 tablet (360 mg total) by mouth daily.  90 tablet  3  . fish oil-omega-3 fatty acids 1000 MG capsule Take 1 g by mouth daily.       [name] HYDROcodone-acetaminophen (NORCO/VICODIN) 5-325 MG per tablet Take 1 tablet by mouth daily as needed. To fill Feb 13, 2013  30 tablet  0  . LORazepam (ATIVAN) 0.5 MG tablet Take 1 tablet (0.5 mg total) by mouth 2 (two) times daily as needed for anxiety.  60 tablet  1  . Magnesium  400 MG CAPS Take 1 capsule by mouth daily.      . metoprolol succinate (TOPROL-XL) 100 MG 24 hr tablet Take 1 tablet (100 mg total) by mouth daily. Take with or immediately following a meal.  90 tablet  3  . Multiple Vitamin (MULTIVITAMIN) tablet Take 1 tablet by mouth daily.        . Probiotic Product (RESTORA) CAPS Take 1 capsule by mouth daily.  10 capsule  0  . warfarin (COUMADIN) 5 MG tablet Take 5-7.5 mg by mouth daily. Takes 5 mg on Thursday and 7.5 mg all other days       No current facility-administered medications on file prior to visit.    Review of Systems Constitutional:  Negative for diaphoresis, activity change, appetite change or unexpected weight change.  HENT: Negative for hearing loss, ear pain, facial swelling, mouth sores and neck stiffness.   Eyes: Negative for pain, redness and visual disturbance.  Respiratory: Negative for shortness of breath and wheezing.   Cardiovascular: Negative for chest pain and palpitations.  Gastrointestinal: Negative for diarrhea, blood in stool, abdominal distention or other pain Genitourinary: Negative for hematuria, flank pain or change in urine volume.  Musculoskeletal: Negative for myalgias and joint swelling.  Skin: Negative for color change and wound.  Neurological: Negative for syncope and numbness. other than noted Hematological: Negative for adenopathy.  Psychiatric/Behavioral: Negative for hallucinations, self-injury, decreased concentration and agitation.      Objective:   Physical Exam BP 126/70  Pulse 62  Temp(Src) 97.4 F (36.3 C) (Oral)  Wt 171 lb (77.565 kg)  SpO2 98% VS noted,  Constitutional: Pt is oriented to person, place, and time. Appears well-developed and well-nourished.  Head: Normocephalic and atraumatic.  Right Ear: External ear normal.  Left Ear: External ear normal.  Nose: Nose normal.  Mouth/Throat: Oropharynx is clear and moist.  Eyes: Conjunctivae and EOM are normal. Pupils are equal, round, and reactive to light.  Neck: Normal range of motion. Neck supple. No JVD present. No tracheal deviation present.  Cardiovascular: Normal rate, regular rhythm, normal heart sounds and intact distal pulses.   Pulmonary/Chest: Effort normal and breath sounds normal.  Abdominal: Soft. Bowel sounds are normal. There is no tenderness. No HSM  Musculoskeletal: Normal range of motion. Exhibits no edema.  Lymphadenopathy:  Has no cervical adenopathy.  Neurological: Pt is alert and oriented to person, place, and time. Pt has normal reflexes. No cranial nerve deficit.  Skin: Skin is warm and dry. No  rash noted.  Psychiatric:  Has  normal mood and affect. Behavior is normal.     Assessment & Plan:

## 2013-05-24 NOTE — Addendum Note (Signed)
Addended by: Douglass Rivers T on: 05/24/2013 09:32 AM   Modules accepted: Orders

## 2013-05-27 ENCOUNTER — Encounter: Payer: Self-pay | Admitting: Internal Medicine

## 2013-05-27 ENCOUNTER — Ambulatory Visit (INDEPENDENT_AMBULATORY_CARE_PROVIDER_SITE_OTHER): Payer: Medicare Other | Admitting: Internal Medicine

## 2013-05-27 VITALS — BP 119/69 | HR 59 | Ht 74.0 in | Wt 172.0 lb

## 2013-05-27 DIAGNOSIS — I4892 Unspecified atrial flutter: Secondary | ICD-10-CM

## 2013-05-27 DIAGNOSIS — I4891 Unspecified atrial fibrillation: Secondary | ICD-10-CM

## 2013-05-27 NOTE — Telephone Encounter (Signed)
Pt seen in clinic today by Dr. Rayann Heman. Pt  Is to f/u with [name] in 6 months, and doesn't need to be seen by Dr. Rayann Heman again. Pt and wife are aware of the plan of care and agreeable.

## 2013-05-27 NOTE — Patient Instructions (Signed)
Your physician recommends that you continue on your current medications as directed. Please refer to the Current Medication list given to you today.  Your physician wants you to follow-up in: 6 months with Dr. Klein. You will receive a reminder letter in the mail two months in advance. If you don't receive a letter, please call our office to schedule the follow-up appointment.  

## 2013-05-29 NOTE — Progress Notes (Signed)
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  Since his recent discharge/ cardioversion for atrial flutter, the patient reports doing very well.  He has had no further arrhythmias.  Today, he denies symptoms of palpitations, chest pain, shortness of breath,  lower extremity edema, dizziness, presyncope, or syncope.  The patient is otherwise without complaint today.   Past Medical History  Diagnosis Date  . MELANOMA, MALIGNANT, SKIN NOS 09/29/2006  . HYPERLIPIDEMIA 09/29/2006  . ERECTILE DYSFUNCTION 09/29/2006  . 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 ABUSE, ALCOHOL, IN REMISSION 04/08/2007  . Other specified forms of hearing loss 08/10/2009  . HYPERTENSION 09/29/2006  . VENTRICULAR TACHYCARDIA 09/29/2006  . SINUSITIS- ACUTE-NOS 06/23/2007  . ALLERGIC RHINITIS 09/29/2006  . BENIGN PROSTATIC HYPERTROPHY 09/29/2006  . OSTEOARTHROSIS NOS, OTHER [facility] SITE 09/29/2006  . BACK PAIN 09/12/2008  . LEG PAIN, RIGHT 10/02/2008  . VERTIGO 04/08/2007  . COLONIC POLYPS, HX OF 06/23/2007  . CHEST PAIN 07/04/2008  . FATIGUE 11/16/2009  . VIRAL GASTROENTERITIS 01/29/2010  . GLUCOSE INTOLERANCE 03/19/2010  . Atrial flutter 03/28/2010    a. s/p prior rfca;  b. recurrent paroxysmal flutter 04/2012;  c. pradaxa initiated 04/2012.  Jonathan Orozco Long term (current) use of anticoagulants 04/26/2010  . Dysrhythmia, cardiac 2012    atrial flutter   Past Surgical History  Procedure Laterality Date  . Left rotato cuff    . Joint replacement      Right knee  . Tonsillectomy    . Left knee arthroscopy      Current Outpatient Prescriptions  Medication Sig Dispense Refill  . Calcium Carb-Cholecalciferol (CALCIUM 600 + D PO) Take 1 tablet by mouth daily.      Jonathan Orozco diltiazem (MATZIM LA) 360 MG 24 hr tablet Take 1 tablet (360 mg total) by mouth daily.  90 tablet  3  . fish oil-omega-3 fatty acids 1000 MG capsule Take 1 g by mouth daily.       Jonathan Orozco  HYDROcodone-acetaminophen (NORCO/VICODIN) 5-325 MG per tablet Take 1 tablet by mouth daily as needed. To fill Feb 13, 2013  30 tablet  0  . LORazepam (ATIVAN) 0.5 MG tablet Take 1 tablet (0.5 mg total) by mouth 2 (two) times daily as needed for anxiety.  60 tablet  1  . Magnesium 400 MG CAPS Take 1 capsule by mouth daily.      . metoprolol succinate (TOPROL-XL) 100 MG 24 hr tablet Take 1 tablet (100 mg total) by mouth daily. Take with or immediately following a meal.  90 tablet  3  . Multiple Vitamin (MULTIVITAMIN) tablet Take 1 tablet by mouth daily.        . Probiotic Product (RESTORA) CAPS Take 1 capsule by mouth daily.  10 capsule  0  . warfarin (COUMADIN) 5 MG tablet Take 5-7.5 mg by mouth daily. Takes 5 mg on Thursday and 7.5 mg all other days       No current facility-administered medications for this visit.    Physical Exam: Filed Vitals:   05/27/13 1557  BP: 119/69  Pulse: 59  Height: 6\' 2"  (1.88 m)  Weight: 172 lb (78.019 kg)    GEN- The patient is well appearing, alert and oriented x 3 today.   Head- normocephalic, atraumatic Eyes-  Sclera clear, conjunctiva pink Ears- hearing intact Oropharynx- clear Lungs- Clear to ausculation bilaterally, normal work of breathing Heart- Regular rate and rhythm, no murmurs,  rubs or gallops, PMI not laterally displaced GI- soft, NT, ND, + BS Extremities- no clubbing, cyanosis, or edema  ekg today reveals sinus rhythm 59 bpm, PR 234, RBBB  Assessment and Plan:  1. Atypical atrial flutter Doing well s/p recent cardioversion He has actually done rather well for a while With prior ablation, he had multiple atrial flutter circuits and his arrhythmia was too unstable for mapping.  I would advise medical therapy going forward No changes are warranted today.  His chads2vasc score is at least 3.  He should therefore continue long term anticoagulation  Follow-up with Dr [name] I will see as needed

## 2013-06-01 ENCOUNTER — Ambulatory Visit (INDEPENDENT_AMBULATORY_CARE_PROVIDER_SITE_OTHER)
Admission: RE | Admit: 2013-06-01 | Discharge: 2013-06-01 | Disposition: A | Discharge status: Home / Self Care | Payer: Medicare Other | Source: Ambulatory Visit | Attending: Internal Medicine | Admitting: Internal Medicine

## 2013-06-01 DIAGNOSIS — Z85820 Personal history of malignant melanoma of skin: Secondary | ICD-10-CM

## 2013-06-01 DIAGNOSIS — Z87891 Personal history of nicotine dependence: Secondary | ICD-10-CM

## 2013-06-01 DIAGNOSIS — R911 Solitary pulmonary nodule: Secondary | ICD-10-CM

## 2013-06-20 ENCOUNTER — Ambulatory Visit (INDEPENDENT_AMBULATORY_CARE_PROVIDER_SITE_OTHER): Payer: Medicare Other | Admitting: *Deleted

## 2013-06-20 DIAGNOSIS — I4892 Unspecified atrial flutter: Secondary | ICD-10-CM

## 2013-06-20 DIAGNOSIS — Z5181 Encounter for therapeutic drug level monitoring: Secondary | ICD-10-CM | POA: Insufficient documentation

## 2013-06-20 LAB — POCT INR: INR: 2.6

## 2013-06-24 ENCOUNTER — Telehealth: Payer: Self-pay | Admitting: Internal Medicine

## 2013-06-24 MED ORDER — HYDROCODONE-ACETAMINOPHEN 5-325 MG PO TABS: 1.0000 | ORAL_TABLET | Freq: Every day | ORAL | Status: DC | PRN

## 2013-06-24 NOTE — Telephone Encounter (Signed)
Refill on hydrocodone.  Wife wants him to have 10 days or 3- 30 day RX's.  Aware we are closed Monday.

## 2013-06-24 NOTE — Telephone Encounter (Signed)
Done hardcopy to robin  

## 2013-06-28 NOTE — Telephone Encounter (Signed)
Called the patient informed his wife to have the patient pickup hardcopy at the front desk.

## 2013-08-03 ENCOUNTER — Ambulatory Visit (INDEPENDENT_AMBULATORY_CARE_PROVIDER_SITE_OTHER): Payer: Medicare Other | Admitting: *Deleted

## 2013-08-03 DIAGNOSIS — I4892 Unspecified atrial flutter: Secondary | ICD-10-CM

## 2013-08-03 DIAGNOSIS — Z5181 Encounter for therapeutic drug level monitoring: Secondary | ICD-10-CM

## 2013-08-03 LAB — POCT INR: INR: 2.4

## 2013-08-17 ENCOUNTER — Other Ambulatory Visit: Payer: Self-pay

## 2013-08-17 ENCOUNTER — Telehealth: Payer: Self-pay

## 2013-08-17 MED ORDER — WARFARIN SODIUM 5 MG PO TABS: ORAL_TABLET | ORAL | Status: DC

## 2013-08-17 MED ORDER — METOPROLOL SUCCINATE ER 100 MG PO TB24: 100.0000 mg | ORAL_TABLET | Freq: Every day | ORAL | Status: DC

## 2013-08-17 NOTE — Telephone Encounter (Signed)
Coumadin refilled.

## 2013-08-29 ENCOUNTER — Other Ambulatory Visit: Payer: Self-pay | Admitting: *Deleted

## 2013-08-29 MED ORDER — WARFARIN SODIUM 5 MG PO TABS: ORAL_TABLET | ORAL | Status: DC

## 2013-08-29 MED ORDER — METOPROLOL SUCCINATE ER 100 MG PO TB24: 100.0000 mg | ORAL_TABLET | Freq: Every day | ORAL | Status: DC

## 2013-09-01 ENCOUNTER — Ambulatory Visit (INDEPENDENT_AMBULATORY_CARE_PROVIDER_SITE_OTHER): Payer: Medicare Other | Admitting: Internal Medicine

## 2013-09-01 ENCOUNTER — Encounter: Payer: Self-pay | Admitting: Internal Medicine

## 2013-09-01 VITALS — BP 130/90 | HR 71 | Temp 98.1°F | Wt 174.0 lb

## 2013-09-01 DIAGNOSIS — S99929A Unspecified injury of unspecified foot, initial encounter: Secondary | ICD-10-CM

## 2013-09-01 DIAGNOSIS — S8991XA Unspecified injury of right lower leg, initial encounter: Secondary | ICD-10-CM

## 2013-09-01 DIAGNOSIS — S8990XA Unspecified injury of unspecified lower leg, initial encounter: Secondary | ICD-10-CM

## 2013-09-01 DIAGNOSIS — T148XXA Other injury of unspecified body region, initial encounter: Secondary | ICD-10-CM

## 2013-09-01 DIAGNOSIS — S99919A Unspecified injury of unspecified ankle, initial encounter: Secondary | ICD-10-CM

## 2013-09-01 DIAGNOSIS — IMO0002 Reserved for concepts with insufficient information to code with codable children: Secondary | ICD-10-CM

## 2013-09-01 NOTE — Progress Notes (Signed)
   Subjective:    Patient ID: Jonathan Orozco, male    DOB: 15-Oct-1928, 80 y.o.   MRN: [id number]  HPI  He was riding his exercise bike 08/25/13; he became overheated and attempted to take off his shirt. He lost his balance and fell on his right knee. This is in the context of Coumadin therapy.  He employed RICE with significant decrease in the swelling. He did apply topical antibiotic ointment to the abrasion.  The knee is the site of a total knee replacement done in 2005 by Dr. Lorin Mercy.  He continues to have some stiffness, swelling, and redness.    Review of Systems  He denies fever, chills, sweats, or evidence of cellulitis extending up the thigh.     Objective:   Physical Exam  He appears much younger than his stated age. He is in no distress at all.  There is pattern alopecia; he wears a goatee.  Heart rhythm is regular with an S4  Breath sounds are slightly decreased; no increased work of breathing.  There is slight ecchymosis of the right medial thigh.  There is an abrasion over the right knee ;no signs of associated cellulitis. There is mild erythema around the abrasion. This blanches with pressure.  Operative scar of the knee is well-healed. There is fusiform enlargement of the right knee and suggestion of slight fluctuance.  Pedal pulses are equal but decreased..  Stasis hyperpigmentation is noted over the shins bilaterally       Assessment & Plan:  #1 knee trauma with associated abrasion. There is no sign of cellulitis.  There may be some residual effusion of the right knee; but by history the swelling is improving.  Plan: It is recommended that he elevate the leg as much as possible. No antibiotic ointment needed. Warning signs were discussed.

## 2013-09-01 NOTE — Progress Notes (Signed)
   Subjective:    Patient ID: Jonathan Orozco, male    DOB: 01/02/1929, 80 y.o.   MRN: [id number]  HPI Patient comes in today for a right swollen knee. He fell off of his exercise bike at home on 08/25/13 after being hot and pulling his shirt over his head. He fell onto his right knee and scraped it. He also has a healing yellow bruise on this inside of his thigh. He is on coumadin and omega 3's, which contributed to the bruising. He elevates the right leg and has used ice, which has led to improvement. He has a right knee TKA done 10 years ago by Dr. Lorin Mercy. He feels it is stiff, swollen, and red.    Review of Systems Denies pus, fever, chills, sweats, red streaks.     Objective:   Physical Exam        Assessment & Plan:

## 2013-09-01 NOTE — Progress Notes (Signed)
Pre visit review using our clinic review tool, if applicable. No additional management support is needed unless otherwise documented below in the visit note. 

## 2013-09-01 NOTE — Patient Instructions (Signed)
Please report warning signs as we discussed. Worrisome would be  ; pus formation; red streaks going up the leg; and fever/chills. Do not apply antibiotic ointment to the area which is healing well.

## 2013-09-14 ENCOUNTER — Ambulatory Visit (INDEPENDENT_AMBULATORY_CARE_PROVIDER_SITE_OTHER): Payer: Medicare Other | Admitting: Surgery

## 2013-09-14 DIAGNOSIS — I4892 Unspecified atrial flutter: Secondary | ICD-10-CM

## 2013-09-14 DIAGNOSIS — Z5181 Encounter for therapeutic drug level monitoring: Secondary | ICD-10-CM

## 2013-09-14 LAB — POCT INR: INR: 2.5

## 2013-09-16 ENCOUNTER — Ambulatory Visit (INDEPENDENT_AMBULATORY_CARE_PROVIDER_SITE_OTHER): Payer: Medicare Other | Admitting: Pharmacist

## 2013-09-16 DIAGNOSIS — I4892 Unspecified atrial flutter: Secondary | ICD-10-CM

## 2013-09-16 DIAGNOSIS — Z5181 Encounter for therapeutic drug level monitoring: Secondary | ICD-10-CM

## 2013-09-16 LAB — POCT INR: INR: 2.1

## 2013-10-12 ENCOUNTER — Telehealth: Payer: Self-pay | Admitting: Internal Medicine

## 2013-10-12 MED ORDER — HYDROCODONE-ACETAMINOPHEN 5-325 MG PO TABS: 1.0000 | ORAL_TABLET | Freq: Every day | ORAL | Status: DC | PRN

## 2013-10-12 NOTE — Telephone Encounter (Signed)
Pt request refill for hydrocodone for 90 day supply. Pt request to pick this up because he send it off to mail order. Please call if this is ok

## 2013-10-12 NOTE — Telephone Encounter (Signed)
Done hardcopy to robin  

## 2013-10-13 NOTE — Telephone Encounter (Signed)
Called the patient informed hardcopy is ready for pickup at our front desk.

## 2013-10-21 ENCOUNTER — Ambulatory Visit (INDEPENDENT_AMBULATORY_CARE_PROVIDER_SITE_OTHER): Payer: Medicare Other

## 2013-10-21 DIAGNOSIS — I4892 Unspecified atrial flutter: Secondary | ICD-10-CM

## 2013-10-21 DIAGNOSIS — Z23 Encounter for immunization: Secondary | ICD-10-CM

## 2013-10-21 DIAGNOSIS — Z5181 Encounter for therapeutic drug level monitoring: Secondary | ICD-10-CM

## 2013-10-21 LAB — POCT INR: INR: 2.9

## 2013-12-02 ENCOUNTER — Ambulatory Visit (INDEPENDENT_AMBULATORY_CARE_PROVIDER_SITE_OTHER): Payer: Medicare Other | Admitting: *Deleted

## 2013-12-02 DIAGNOSIS — Z5181 Encounter for therapeutic drug level monitoring: Secondary | ICD-10-CM

## 2013-12-02 DIAGNOSIS — I4892 Unspecified atrial flutter: Secondary | ICD-10-CM

## 2013-12-02 LAB — POCT INR: INR: 2.7

## 2014-01-12 ENCOUNTER — Other Ambulatory Visit: Payer: Self-pay | Admitting: *Deleted

## 2014-01-12 ENCOUNTER — Telehealth: Payer: Self-pay | Admitting: Internal Medicine

## 2014-01-12 MED ORDER — METOPROLOL SUCCINATE ER 100 MG PO TB24: 100.0000 mg | ORAL_TABLET | Freq: Every day | ORAL | Status: DC

## 2014-01-12 MED ORDER — HYDROCODONE-ACETAMINOPHEN 5-325 MG PO TABS: 1.0000 | ORAL_TABLET | Freq: Every day | ORAL | Status: DC | PRN

## 2014-01-12 NOTE — Telephone Encounter (Signed)
Pts wife called requesting  a refill on HYDROcodone-acetaminophen (NORCO/VICODIN) 5-325 MG per tablet [[id number]]  For the pt

## 2014-01-12 NOTE — Telephone Encounter (Signed)
Done hardcopy to robin  

## 2014-01-13 ENCOUNTER — Ambulatory Visit (INDEPENDENT_AMBULATORY_CARE_PROVIDER_SITE_OTHER): Payer: Medicare Other | Admitting: *Deleted

## 2014-01-13 DIAGNOSIS — I4892 Unspecified atrial flutter: Secondary | ICD-10-CM

## 2014-01-13 DIAGNOSIS — Z5181 Encounter for therapeutic drug level monitoring: Secondary | ICD-10-CM

## 2014-01-13 LAB — POCT INR: INR: 3.3

## 2014-01-13 NOTE — Telephone Encounter (Signed)
Called pt no answer [facility] rx ready for pick-up.../lmb 

## 2014-02-10 ENCOUNTER — Ambulatory Visit (INDEPENDENT_AMBULATORY_CARE_PROVIDER_SITE_OTHER): Payer: Medicare Other

## 2014-02-10 DIAGNOSIS — Z5181 Encounter for therapeutic drug level monitoring: Secondary | ICD-10-CM

## 2014-02-10 DIAGNOSIS — I4892 Unspecified atrial flutter: Secondary | ICD-10-CM

## 2014-02-10 LAB — POCT INR: INR: 2.9

## 2014-03-06 ENCOUNTER — Ambulatory Visit (INDEPENDENT_AMBULATORY_CARE_PROVIDER_SITE_OTHER): Payer: Medicare Other | Admitting: Internal Medicine

## 2014-03-06 ENCOUNTER — Encounter: Payer: Self-pay | Admitting: Internal Medicine

## 2014-03-06 VITALS — BP 134/72 | HR 67 | Ht 72.0 in | Wt 177.8 lb

## 2014-03-06 DIAGNOSIS — I4892 Unspecified atrial flutter: Secondary | ICD-10-CM

## 2014-03-06 MED ORDER — DILTIAZEM HCL ER COATED BEADS 360 MG PO TB24: 360.0000 mg | ORAL_TABLET | Freq: Every day | ORAL | Status: DC

## 2014-03-06 NOTE — Patient Instructions (Signed)
Your physician recommends that you continue on your current medications as directed. Please refer to the Current Medication list given to you today.  Your physician wants you to follow-up in: 1 year with Dr. Klein.  You will receive a reminder letter in the mail two months in advance. If you don't receive a letter, please call our office to schedule the follow-up appointment.  

## 2014-03-06 NOTE — Progress Notes (Signed)
Patient Care Team: Biagio Borg, MD as PCP - General   HPI  Jonathan Orozco is a 81 y.o. male Seen in followup for outflow tract ectopy and atrial flutters for which  he underwent EP testing and 3-D mapping which failed to produce stable arrhythmias; medical therapy was undertaken  He is been managed with rate control and anticoagulation with warfarin. Previously he had been on NOACs but was intolerant of Pradaxa because of bloating   He denies chest pain or shortness of breath. He's had no peripheral edema. He has had no palpitations or syncope. He has no complaints regarding his medications.  He exercises regularly; he notes his peak heart rate is in the 80s-90s but is not associated with any limitations   He had noted bradycardia at home with heart rates in the 40s. He increased his metoprolol from 100--50    Past Medical History  Diagnosis Date  . MELANOMA, MALIGNANT, SKIN NOS 09/29/2006  . HYPERLIPIDEMIA 09/29/2006  . ERECTILE DYSFUNCTION 09/29/2006  . 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 ABUSE, ALCOHOL, IN REMISSION 04/08/2007  . Other specified forms of hearing loss 08/10/2009  . HYPERTENSION 09/29/2006  . VENTRICULAR TACHYCARDIA 09/29/2006  . SINUSITIS- ACUTE-NOS 06/23/2007  . ALLERGIC RHINITIS 09/29/2006  . BENIGN PROSTATIC HYPERTROPHY 09/29/2006  . OSTEOARTHROSIS NOS, OTHER [facility] SITE 09/29/2006  . BACK PAIN 09/12/2008  . LEG PAIN, RIGHT 10/02/2008  . VERTIGO 04/08/2007  . COLONIC POLYPS, HX OF 06/23/2007  . CHEST PAIN 07/04/2008  . FATIGUE 11/16/2009  . VIRAL GASTROENTERITIS 01/29/2010  . GLUCOSE INTOLERANCE 03/19/2010  . Atrial flutter 03/28/2010    a. s/p prior rfca;  b. recurrent paroxysmal flutter 04/2012;  c. pradaxa initiated 04/2012.  [name] Long term (current) use of anticoagulants 04/26/2010  . Dysrhythmia, cardiac 2012    atrial flutter    Past Surgical History  Procedure Laterality Date  . Left rotato cuff    . Joint replacement      Right knee  . Tonsillectomy    .  Left knee arthroscopy      Current Outpatient Prescriptions  Medication Sig Dispense Refill  . diltiazem (MATZIM LA) 360 MG 24 hr tablet Take 1 tablet (360 mg total) by mouth daily. 90 tablet 3  . fish oil-omega-3 fatty acids 1000 MG capsule Take 1 g by mouth daily.     [name] HYDROcodone-acetaminophen (NORCO/VICODIN) 5-325 MG per tablet Take 1 tablet by mouth daily as needed. (Patient taking differently: Take 1 tablet by mouth daily as needed for moderate pain or severe pain. ) 90 tablet 0  . LORazepam (ATIVAN) 0.5 MG tablet Take 1 tablet (0.5 mg total) by mouth 2 (two) times daily as needed for anxiety. 60 tablet 1  . Magnesium 400 MG CAPS Take 1 capsule by mouth daily.    . metoprolol succinate (TOPROL-XL) 100 MG 24 hr tablet Take 1 tablet (100 mg total) by mouth daily. Take with or immediately following a meal. 90 tablet 0  . Multiple Vitamin (MULTIVITAMIN) tablet Take 1 tablet by mouth daily.      . Probiotic Product (RESTORA) CAPS Take 1 capsule by mouth daily. 10 capsule 0  . warfarin (COUMADIN) 5 MG tablet Take as directed by anticoagulation clinic 150 tablet 1   No current facility-administered medications for this visit.    Allergies  Allergen Reactions  . Ace Inhibitors     REACTION: cough  . Amiodarone Hcl     REACTION: intolerance    Review of Systems  negative except from HPI and PMH  Physical Exam BP 134/72 mmHg  Pulse 67  Ht 6' (1.829 m)  Wt 177 lb 12.8 oz (80.65 kg)  BMI 24.11 kg/m2 Well developed and nourished in no acute distress HENT normal Neck supple with JVP-flat Clear Regular rate and rhythm, no murmurs or gallops Abd-soft with active BS No Clubbing cyanosis edema Skin-warm and dry A & Oriented  Grossly normal sensory and motor function   ECG demonstrates sinus rhythm at 67 Intervals 21/14/44 Axis LXV  Assessment and  Plan  Atrial fibrillation/flutter  Sinus bradycardia  Hypertension  He has done well without recurrent  tachypalpitations.  He appropriately down titrate his metoprolol with his sinus bradycardia. There is been no consequences related to tachycardia. His blood pressures are well controlled. At home he says are mostly under 1:30.  We'll see him in one year's time

## 2014-03-10 ENCOUNTER — Ambulatory Visit (INDEPENDENT_AMBULATORY_CARE_PROVIDER_SITE_OTHER): Payer: Medicare Other | Admitting: *Deleted

## 2014-03-10 DIAGNOSIS — I4892 Unspecified atrial flutter: Secondary | ICD-10-CM

## 2014-03-10 DIAGNOSIS — Z5181 Encounter for therapeutic drug level monitoring: Secondary | ICD-10-CM

## 2014-03-10 LAB — POCT INR: INR: 3.6

## 2014-03-14 ENCOUNTER — Telehealth: Payer: Self-pay | Admitting: *Deleted

## 2014-03-14 ENCOUNTER — Other Ambulatory Visit: Payer: Self-pay | Admitting: *Deleted

## 2014-03-14 MED ORDER — WARFARIN SODIUM 5 MG PO TABS: ORAL_TABLET | ORAL | Status: DC

## 2014-03-14 NOTE — Telephone Encounter (Signed)
Wife called to state that the patient forgot to inform us that he stopped drinking soft drinks and they think this is the reason his INR level was elevated at the last Coumadin Clinic visit. Advised her that soft drinks does not interfer with INR levels. She stated she understood. Also she requested a refill on his coumadin and I stated that I will send it in today.

## 2014-03-24 ENCOUNTER — Ambulatory Visit (INDEPENDENT_AMBULATORY_CARE_PROVIDER_SITE_OTHER): Payer: Medicare Other | Admitting: *Deleted

## 2014-03-24 DIAGNOSIS — Z5181 Encounter for therapeutic drug level monitoring: Secondary | ICD-10-CM

## 2014-03-24 DIAGNOSIS — I4892 Unspecified atrial flutter: Secondary | ICD-10-CM

## 2014-03-24 LAB — POCT INR: INR: 4

## 2014-04-04 ENCOUNTER — Encounter: Payer: Self-pay | Admitting: Cardiology

## 2014-04-07 ENCOUNTER — Telehealth: Payer: Self-pay | Admitting: Internal Medicine

## 2014-04-07 ENCOUNTER — Ambulatory Visit (INDEPENDENT_AMBULATORY_CARE_PROVIDER_SITE_OTHER): Payer: Medicare Other

## 2014-04-07 DIAGNOSIS — Z5181 Encounter for therapeutic drug level monitoring: Secondary | ICD-10-CM

## 2014-04-07 DIAGNOSIS — I4892 Unspecified atrial flutter: Secondary | ICD-10-CM

## 2014-04-07 LAB — POCT INR: INR: 2.2

## 2014-04-07 NOTE — Telephone Encounter (Signed)
New message     Pt received a letter stating we have not checked his defibulator----he does not have a defibulator implanted in his chest.

## 2014-04-07 NOTE — Telephone Encounter (Signed)
Informed pt wife that letter was mailed to pt by mistake and to disregard letter.

## 2014-04-24 ENCOUNTER — Telehealth: Payer: Self-pay | Admitting: Internal Medicine

## 2014-04-24 NOTE — Telephone Encounter (Signed)
Pt wife called in wanted to know if Dr Jenny Reichmann could refill his Hydrocodone for 90 days.  They will come pick up script when it is ready.  [facility]  She is aware Dr Jenny Reichmann is not in office today

## 2014-04-25 MED ORDER — HYDROCODONE-ACETAMINOPHEN 5-325 MG PO TABS: 1.0000 | ORAL_TABLET | Freq: Every day | ORAL | Status: DC | PRN

## 2014-04-25 NOTE — Telephone Encounter (Signed)
Done hardcopy to Cherina  

## 2014-04-25 NOTE — Telephone Encounter (Signed)
Spoke with Pt's wife. Said the pt. Would be here later today to visit the coumadin clinic and would pick up RX then. RX was left in the cabinet up front.

## 2014-05-02 ENCOUNTER — Ambulatory Visit (INDEPENDENT_AMBULATORY_CARE_PROVIDER_SITE_OTHER): Payer: Medicare Other | Admitting: Pharmacist

## 2014-05-02 DIAGNOSIS — Z5181 Encounter for therapeutic drug level monitoring: Secondary | ICD-10-CM | POA: Diagnosis not present

## 2014-05-02 DIAGNOSIS — I4892 Unspecified atrial flutter: Secondary | ICD-10-CM | POA: Diagnosis not present

## 2014-05-02 LAB — POCT INR: INR: 3.2

## 2014-05-23 ENCOUNTER — Ambulatory Visit (INDEPENDENT_AMBULATORY_CARE_PROVIDER_SITE_OTHER): Payer: Medicare Other

## 2014-05-23 DIAGNOSIS — I4892 Unspecified atrial flutter: Secondary | ICD-10-CM | POA: Diagnosis not present

## 2014-05-23 DIAGNOSIS — Z5181 Encounter for therapeutic drug level monitoring: Secondary | ICD-10-CM

## 2014-05-23 LAB — POCT INR: INR: 2.6

## 2014-05-30 ENCOUNTER — Ambulatory Visit (INDEPENDENT_AMBULATORY_CARE_PROVIDER_SITE_OTHER): Payer: Medicare Other | Admitting: Internal Medicine

## 2014-05-30 ENCOUNTER — Encounter: Payer: Self-pay | Admitting: Internal Medicine

## 2014-05-30 VITALS — BP 126/60 | HR 68 | Ht 72.0 in | Wt 177.1 lb

## 2014-05-30 DIAGNOSIS — R142 Eructation: Secondary | ICD-10-CM

## 2014-05-30 DIAGNOSIS — K5790 Diverticulosis of intestine, part unspecified, without perforation or abscess without bleeding: Secondary | ICD-10-CM

## 2014-05-30 DIAGNOSIS — R143 Flatulence: Secondary | ICD-10-CM

## 2014-05-30 DIAGNOSIS — K5900 Constipation, unspecified: Secondary | ICD-10-CM

## 2014-05-30 DIAGNOSIS — R141 Gas pain: Secondary | ICD-10-CM | POA: Diagnosis not present

## 2014-05-30 NOTE — Progress Notes (Signed)
HISTORY OF PRESENT ILLNESS:  Jonathan Orozco is a 81 y.o. male with multiple significant medical problems as listed below. He is self-referred today regarding complaints with his bowels and lower abdomen. The patient was last evaluated November 2014 regarding alternating bowels without alarm features and increased intestinal gas. See that dictation. He has undergone several colonoscopies. Most recent exam 2009 with diverticulosis and diminutive adenomas. At time of his last visit he was treated with probiotic and fiber recommended. He thinks this helped. He was concerned about celiac disease. Celiac serologies were mildly abnormal. However, endoscopy with duodenal biopsies were negative. He reports today that his bowels may be ball-like. As well, he has a sensation in the left lower quadrant when he needs to defecate. He was told as a child that he has spastic colon. He stopped fiber for unclear reasons. He continues to use probiotic intermittently with uncertain results. He denies abdominal pain, bleeding, or weight loss. 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  REVIEW OF SYSTEMS:  All non-GI ROS negative except for back pain  Past Medical History  Diagnosis Date  . MELANOMA, MALIGNANT, SKIN NOS 09/29/2006  . HYPERLIPIDEMIA 09/29/2006  . ERECTILE DYSFUNCTION 09/29/2006  . 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 ABUSE, ALCOHOL, IN REMISSION 04/08/2007  . Other specified forms of hearing loss 08/10/2009  . HYPERTENSION 09/29/2006  . VENTRICULAR TACHYCARDIA 09/29/2006  . SINUSITIS- ACUTE-NOS 06/23/2007  . ALLERGIC RHINITIS 09/29/2006  . BENIGN PROSTATIC HYPERTROPHY 09/29/2006  . OSTEOARTHROSIS NOS, OTHER [facility] SITE 09/29/2006  . BACK PAIN 09/12/2008  . LEG PAIN, RIGHT 10/02/2008  . VERTIGO 04/08/2007  . COLONIC POLYPS, HX OF 06/23/2007  . CHEST PAIN 07/04/2008  . FATIGUE 11/16/2009  . VIRAL GASTROENTERITIS 01/29/2010  . GLUCOSE INTOLERANCE 03/19/2010  . Atrial flutter 03/28/2010    a. s/p prior  rfca;  b. recurrent paroxysmal flutter 04/2012;  c. pradaxa initiated 04/2012.  [name] Long term (current) use of anticoagulants 04/26/2010  . Dysrhythmia, cardiac 2012    atrial flutter  . Diverticulosis     Past Surgical History  Procedure Laterality Date  . Left rotato cuff    . Joint replacement      Right knee  . Tonsillectomy    . Left knee arthroscopy      Social History Jonathan Orozco  reports that he has quit smoking. He has never used smokeless tobacco. He reports that he does not drink alcohol or use illicit drugs.  family history includes Diabetes in his brother; Esophageal cancer in his father. There is no history of Colon cancer or Stomach cancer.  Allergies  Allergen Reactions  . Ace Inhibitors     REACTION: cough  . Amiodarone Hcl     REACTION: intolerance       PHYSICAL EXAMINATION: Vital signs: BP 126/60 mmHg  Pulse 68  Ht 6' (1.829 m)  Wt 177 lb 2 oz (80.343 kg)  BMI 24.02 kg/m2 General: Well-developed, well-nourished, no acute distress HEENT: Sclerae are anicteric, conjunctiva pink. Oral mucosa intact Lungs: Clear Heart: Regular Abdomen: soft, nontender, nondistended, no obvious ascites, no peritoneal signs, normal bowel sounds. No organomegaly. Extremities: No edema Psychiatric: alert and oriented x3. Cooperative   ASSESSMENT:  #1. Slightly irregular bowel habits as described. Suspect related to diverticulosis #2. Feelings of spasm and left lower quadrant related to diverticulosis #3. Health related anxiety #4. Colonoscopy 2009 with diverticulosis and diminutive adenomas  PLAN:  #1. Reassurance. Discussion on diverticular disease #2. Reinitiate Metamucil one to 2  tablespoons daily #3. Aged out of colonic surveillance  20 minutes spent with the patient face-to-face. Greater than 50% of the time use for counseling him regarding his bowel habits and diverticular disease

## 2014-05-30 NOTE — Patient Instructions (Signed)
Please follow up with Dr. Perry as needed 

## 2014-05-31 ENCOUNTER — Encounter: Payer: Self-pay | Admitting: Internal Medicine

## 2014-05-31 ENCOUNTER — Ambulatory Visit (INDEPENDENT_AMBULATORY_CARE_PROVIDER_SITE_OTHER): Payer: Medicare Other | Admitting: Internal Medicine

## 2014-05-31 ENCOUNTER — Other Ambulatory Visit (INDEPENDENT_AMBULATORY_CARE_PROVIDER_SITE_OTHER): Payer: Medicare Other

## 2014-05-31 VITALS — BP 124/84 | HR 70 | Temp 97.7°F | Resp 18 | Ht 72.0 in | Wt 176.1 lb

## 2014-05-31 DIAGNOSIS — R739 Hyperglycemia, unspecified: Secondary | ICD-10-CM

## 2014-05-31 DIAGNOSIS — I10 Essential (primary) hypertension: Secondary | ICD-10-CM

## 2014-05-31 DIAGNOSIS — E785 Hyperlipidemia, unspecified: Secondary | ICD-10-CM

## 2014-05-31 DIAGNOSIS — Z0000 Encounter for general adult medical examination without abnormal findings: Secondary | ICD-10-CM

## 2014-05-31 DIAGNOSIS — R911 Solitary pulmonary nodule: Secondary | ICD-10-CM

## 2014-05-31 LAB — HEPATIC FUNCTION PANEL
ALBUMIN: 4.4 g/dL (ref 3.5–5.2)
ALT: 19 U/L (ref 0–53)
AST: 22 U/L (ref 0–37)
Alkaline Phosphatase: 123 U/L — ABNORMAL HIGH (ref 39–117)
BILIRUBIN TOTAL: 0.7 mg/dL (ref 0.2–1.2)
Bilirubin, Direct: 0.1 mg/dL (ref 0.0–0.3)
TOTAL PROTEIN: 7.3 g/dL (ref 6.0–8.3)

## 2014-05-31 LAB — URINALYSIS, ROUTINE W REFLEX MICROSCOPIC
Bilirubin Urine: NEGATIVE
Hgb urine dipstick: NEGATIVE
Ketones, ur: NEGATIVE
Nitrite: NEGATIVE
RBC / HPF: NONE SEEN (ref 0–?)
Specific Gravity, Urine: 1.015 (ref 1.000–1.030)
Total Protein, Urine: NEGATIVE
Urine Glucose: NEGATIVE
Urobilinogen, UA: 0.2 (ref 0.0–1.0)
pH: 7 (ref 5.0–8.0)

## 2014-05-31 LAB — LIPID PANEL
CHOLESTEROL: 211 mg/dL — AB (ref 0–200)
HDL: 54.6 mg/dL (ref >39.00)
LDL Cholesterol: 142 mg/dL — ABNORMAL HIGH (ref 0–99)
NonHDL: 156.4
TRIGLYCERIDES: 71 mg/dL (ref 0.0–149.0)
Total CHOL/HDL Ratio: 4
VLDL: 14.2 mg/dL (ref 0.0–40.0)

## 2014-05-31 LAB — CBC WITH DIFFERENTIAL/PLATELET
Basophils Absolute: 0 10*3/uL (ref 0.0–0.1)
Basophils Relative: 0.4 % (ref 0.0–3.0)
Eosinophils Absolute: 0.1 10*3/uL (ref 0.0–0.7)
Eosinophils Relative: 2.1 % (ref 0.0–5.0)
HCT: 45.9 % (ref 39.0–52.0)
Hemoglobin: 15.6 g/dL (ref 13.0–17.0)
Lymphocytes Relative: 49.7 % — ABNORMAL HIGH (ref 12.0–46.0)
Lymphs Abs: 3.3 10*3/uL (ref 0.7–4.0)
MCHC: 34 g/dL (ref 30.0–36.0)
MCV: 92.3 fl (ref 78.0–100.0)
Monocytes Absolute: 0.7 10*3/uL (ref 0.1–1.0)
Monocytes Relative: 10.6 % (ref 3.0–12.0)
Neutro Abs: 2.4 10*3/uL (ref 1.4–7.7)
Neutrophils Relative %: 37.2 % — ABNORMAL LOW (ref 43.0–77.0)
Platelets: 258 10*3/uL (ref 150.0–400.0)
RBC: 4.98 Mil/uL (ref 4.22–5.81)
RDW: 13.8 % (ref 11.5–15.5)
WBC: 6.5 10*3/uL (ref 4.0–10.5)

## 2014-05-31 LAB — BASIC METABOLIC PANEL
BUN: 11 mg/dL (ref 6–23)
CO2: 29 mEq/L (ref 19–32)
Calcium: 9.6 mg/dL (ref 8.4–10.5)
Chloride: 102 mEq/L (ref 96–112)
Creatinine, Ser: 1.31 mg/dL (ref 0.40–1.50)
GFR: 55.36 mL/min — ABNORMAL LOW (ref >60.00)
Glucose, Bld: 103 mg/dL — ABNORMAL HIGH (ref 70–99)
Potassium: 4.2 mEq/L (ref 3.5–5.1)
Sodium: 136 mEq/L (ref 135–145)

## 2014-05-31 LAB — HEMOGLOBIN A1C: Hgb A1c MFr Bld: 5.7 % (ref 4.6–6.5)

## 2014-05-31 LAB — TSH: TSH: 0.85 u[IU]/mL (ref 0.35–4.50)

## 2014-05-31 NOTE — Assessment & Plan Note (Signed)
Due for f/u CT - will order

## 2014-05-31 NOTE — Assessment & Plan Note (Signed)

## 2014-05-31 NOTE — Progress Notes (Signed)
Pre visit review using our clinic review tool, if applicable. No additional management support is needed unless otherwise documented below in the visit note. 

## 2014-05-31 NOTE — Patient Instructions (Signed)
Please continue all other medications as before, and refills have been done if requested.  Please have the pharmacy call with any other refills you may need.  Please continue your efforts at being more active, low cholesterol diet, and weight control.  You are otherwise up to date with prevention measures today.  Please keep your appointments with your specialists as you may have planned  You will be contacted regarding the referral for: CT scan for chest  Please go to the LAB in the Basement (turn left off the elevator) for the tests to be done today  You will be contacted by phone if any changes need to be made immediately.  Otherwise, you will receive a letter about your results with an explanation, but please check with MyChart first.  Please remember to sign up for MyChart if you have not done so, as this will be important to you in the future with finding out test results, communicating by private email, and scheduling acute appointments online when needed.  Please return in 6 months, or sooner if needed

## 2014-05-31 NOTE — Progress Notes (Signed)
Subjective:    Patient ID: Jonathan Orozco, male    DOB: Apr 24, 1928, 81 y.o.   MRN: [id number]  HPI  Here for wellness and f/u;  Overall doing ok;  Pt denies Chest pain, worsening SOB, DOE, wheezing, orthopnea, PND, worsening LE edema, palpitations, dizziness or syncope.  Pt denies neurological change such as new headache, facial or extremity weakness.  Pt denies polydipsia, polyuria, or low sugar symptoms. Pt states overall good compliance with treatment and medications, good tolerability, and has been trying to follow appropriate diet.  Pt denies worsening depressive symptoms, suicidal ideation or panic. No fever, night sweats, wt loss, loss of appetite, or other constitutional symptoms.  Pt states good ability with ADL's, has low fall risk, home safety reviewed and adequate, no other significant changes in hearing or vision, and only occasionally active with exercise. Trying to avoid falling and keeping in balance, no recent falls.No new complaints Past Medical History  Diagnosis Date  . MELANOMA, MALIGNANT, SKIN NOS 09/29/2006  . HYPERLIPIDEMIA 09/29/2006  . ERECTILE DYSFUNCTION 09/29/2006  . 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 ABUSE, ALCOHOL, IN REMISSION 04/08/2007  . Other specified forms of hearing loss 08/10/2009  . HYPERTENSION 09/29/2006  . VENTRICULAR TACHYCARDIA 09/29/2006  . SINUSITIS- ACUTE-NOS 06/23/2007  . ALLERGIC RHINITIS 09/29/2006  . BENIGN PROSTATIC HYPERTROPHY 09/29/2006  . OSTEOARTHROSIS NOS, OTHER [facility] SITE 09/29/2006  . BACK PAIN 09/12/2008  . LEG PAIN, RIGHT 10/02/2008  . VERTIGO 04/08/2007  . COLONIC POLYPS, HX OF 06/23/2007  . CHEST PAIN 07/04/2008  . FATIGUE 11/16/2009  . VIRAL GASTROENTERITIS 01/29/2010  . GLUCOSE INTOLERANCE 03/19/2010  . Atrial flutter 03/28/2010    a. s/p prior rfca;  b. recurrent paroxysmal flutter 04/2012;  c. pradaxa initiated 04/2012.  [name] Long term (current) use of anticoagulants 04/26/2010  . Dysrhythmia, cardiac 2012    atrial flutter  .  Diverticulosis    Past Surgical History  Procedure Laterality Date  . Left rotato cuff    . Joint replacement      Right knee  . Tonsillectomy    . Left knee arthroscopy      reports that he has quit smoking. He has never used smokeless tobacco. He reports that he does not drink alcohol or use illicit drugs. family history includes Diabetes in his brother; Esophageal cancer in his father. There is no history of Colon cancer or Stomach cancer. Allergies  Allergen Reactions  . Ace Inhibitors     REACTION: cough  . Amiodarone Hcl     REACTION: intolerance   Current Outpatient Prescriptions on File Prior to Visit  Medication Sig Dispense Refill  . diltiazem (MATZIM LA) 360 MG 24 hr tablet Take 1 tablet (360 mg total) by mouth daily. 90 tablet 3  . fish oil-omega-3 fatty acids 1000 MG capsule Take 1 g by mouth daily.     [name] HYDROcodone-acetaminophen (NORCO/VICODIN) 5-325 MG per tablet Take 1 tablet by mouth daily as needed. 90 tablet 0  . Magnesium 400 MG CAPS Take 1 capsule by mouth daily.    . metoprolol succinate (TOPROL-XL) 100 MG 24 hr tablet Take 1 tablet (100 mg total) by mouth daily. Take with or immediately following a meal. 90 tablet 0  . Multiple Vitamin (MULTIVITAMIN) tablet Take 1 tablet by mouth daily.      . Probiotic Product (RESTORA) CAPS Take 1 capsule by mouth daily. 10 capsule 0  . warfarin (COUMADIN) 5 MG tablet Take as directed by anticoagulation clinic 150 tablet  1   No current facility-administered medications on file prior to visit.     Review of Systems Constitutional: Negative for increased diaphoresis, other activity, appetite or siginficant weight change other than noted HENT: Negative for worsening hearing loss, ear pain, facial swelling, mouth sores and neck stiffness.   Eyes: Negative for other worsening pain, redness or visual disturbance.  Respiratory: Negative for shortness of breath and wheezing  Cardiovascular: Negative for chest pain and  palpitations.  Gastrointestinal: Negative for diarrhea, blood in stool, abdominal distention or other pain Genitourinary: Negative for hematuria, flank pain or change in urine volume.  Musculoskeletal: Negative for myalgias or other joint complaints.  Skin: Negative for color change and wound or drainage.  Neurological: Negative for syncope and numbness. other than noted Hematological: Negative for adenopathy. or other swelling Psychiatric/Behavioral: Negative for hallucinations, SI, self-injury, decreased concentration or other worsening agitation.      Objective:   Physical Exam BP 124/84 mmHg  Pulse 70  Temp(Src) 97.7 F (36.5 C) (Oral)  Resp 18  Ht 6' (1.829 m)  Wt 176 lb 1.3 oz (79.869 kg)  BMI 23.88 kg/m2  SpO2 98% VS noted,  Constitutional: Pt is oriented to person, place, and time. Appears well-developed and well-nourished, in no significant distress Head: Normocephalic and atraumatic.  Right Ear: External ear normal.  Left Ear: External ear normal.  Nose: Nose normal.  Mouth/Throat: Oropharynx is clear and moist.  Eyes: Conjunctivae and EOM are normal. Pupils are equal, round, and reactive to light.  Neck: Normal range of motion. Neck supple. No JVD present. No tracheal deviation present or significant neck LA or mass Cardiovascular: Normal rate, regular rhythm, normal heart sounds and intact distal pulses.   Pulmonary/Chest: Effort normal and breath sounds without rales or wheezing  Abdominal: Soft. Bowel sounds are normal. NT. No HSM  Musculoskeletal: Normal range of motion. Exhibits no edema.  Lymphadenopathy:  Has no cervical adenopathy.  Neurological: Pt is alert and oriented to person, place, and time. Pt has normal reflexes. No cranial nerve deficit. Motor grossly intact Skin: Skin is warm and dry. No rash noted.  Psychiatric:  Has normal mood and affect. Behavior is normal.      Assessment & Plan:

## 2014-06-08 ENCOUNTER — Inpatient Hospital Stay: Admission: RE | Admit: 2014-06-08 | Payer: Medicare Other | Source: Ambulatory Visit

## 2014-06-12 ENCOUNTER — Ambulatory Visit (INDEPENDENT_AMBULATORY_CARE_PROVIDER_SITE_OTHER)
Admission: RE | Admit: 2014-06-12 | Discharge: 2014-06-12 | Disposition: A | Discharge status: Home / Self Care | Payer: Medicare Other | Source: Ambulatory Visit | Attending: Internal Medicine | Admitting: Internal Medicine

## 2014-06-12 DIAGNOSIS — R911 Solitary pulmonary nodule: Secondary | ICD-10-CM

## 2014-06-20 ENCOUNTER — Ambulatory Visit (INDEPENDENT_AMBULATORY_CARE_PROVIDER_SITE_OTHER): Payer: Medicare Other | Admitting: Surgery

## 2014-06-20 DIAGNOSIS — Z5181 Encounter for therapeutic drug level monitoring: Secondary | ICD-10-CM

## 2014-06-20 DIAGNOSIS — I4892 Unspecified atrial flutter: Secondary | ICD-10-CM | POA: Diagnosis not present

## 2014-06-20 LAB — POCT INR: INR: 2

## 2014-07-18 ENCOUNTER — Ambulatory Visit (INDEPENDENT_AMBULATORY_CARE_PROVIDER_SITE_OTHER): Payer: Medicare Other | Admitting: *Deleted

## 2014-07-18 DIAGNOSIS — Z5181 Encounter for therapeutic drug level monitoring: Secondary | ICD-10-CM | POA: Diagnosis not present

## 2014-07-18 DIAGNOSIS — I4892 Unspecified atrial flutter: Secondary | ICD-10-CM | POA: Diagnosis not present

## 2014-07-18 LAB — POCT INR: INR: 2.9

## 2014-07-20 ENCOUNTER — Telehealth: Payer: Self-pay | Admitting: Internal Medicine

## 2014-07-20 MED ORDER — HYDROCODONE-ACETAMINOPHEN 5-325 MG PO TABS: 1.0000 | ORAL_TABLET | Freq: Every day | ORAL | Status: DC | PRN

## 2014-07-20 NOTE — Telephone Encounter (Signed)
Done hardcopy to Dahlia  

## 2014-07-20 NOTE — Telephone Encounter (Signed)
Pt advised via personal VM, Rx in cabinet for pt pick up

## 2014-07-20 NOTE — Telephone Encounter (Signed)
Pt wife called in and said that pt needs his script for HYDROcodone-acetaminophen (NORCO/VICODIN) 5-325 MG per tablet [[id number]]   Sent to [facility] on file

## 2014-08-17 ENCOUNTER — Other Ambulatory Visit: Payer: Self-pay

## 2014-08-17 MED ORDER — METOPROLOL SUCCINATE ER 100 MG PO TB24
100.0000 mg | ORAL_TABLET | Freq: Every day | ORAL | Status: DC
Start: 2014-08-17 — End: 2015-03-07

## 2014-08-18 ENCOUNTER — Encounter: Payer: Self-pay | Admitting: Internal Medicine

## 2014-08-18 ENCOUNTER — Ambulatory Visit (INDEPENDENT_AMBULATORY_CARE_PROVIDER_SITE_OTHER): Payer: Medicare Other | Admitting: Internal Medicine

## 2014-08-18 VITALS — BP 124/80 | HR 69 | Temp 98.3°F | Resp 18 | Wt 172.0 lb

## 2014-08-18 DIAGNOSIS — H6123 Impacted cerumen, bilateral: Secondary | ICD-10-CM

## 2014-08-18 DIAGNOSIS — Z8679 Personal history of other diseases of the circulatory system: Secondary | ICD-10-CM

## 2014-08-18 NOTE — Progress Notes (Signed)
Pre visit review using our clinic review tool, if applicable. No additional management support is needed unless otherwise documented below in the visit note. 

## 2014-08-18 NOTE — Patient Instructions (Signed)
Please do not use Q-tips as this simply packs the wax down against he eardrum. Should wax build up occur, please put 2-3 drops of mineral oil in the ear at night and cover the canal with a  cotton ball.In the morning fill the canal with hydrogen peroxide & leave  for 10-15 minutes.Following this shower and use the thinnest washrag available to wick out the wax.

## 2014-08-18 NOTE — Progress Notes (Signed)
   Subjective:    Patient ID: Jonathan Orozco, male    DOB: 13-Sep-1928, 81 y.o.   MRN: [id number]  HPI  He is having excessive cerumen formation which is clogging his hearing aids. He's had ear canals cleaned out intermittently in the past. He denies using Q-tips.  He denies any associated upper respiratory tract infection symptoms.  He describes a history of paroxysmal atrial flutter. He has no active cardiopulmonary symptoms at this time.  Review of Systems Frontal headache, facial pain , nasal purulence, dental pain, sore throat , otic pain or otic discharge denied. No fever , chills or sweats.  Chest pain, palpitations, tachycardia, exertional dyspnea, paroxysmal nocturnal dyspnea, claudication or edema are absent.      Objective:   Physical Exam  He is profoundly hard of hearing without his hearing aids obviously. He has moderate wax accumulations bilaterally. The tympanic membranes are dull but are partially visible. He is an upper plate. He is not wearing his lower plate.  General appearance:Adequately nourished; no acute distress or increased work of breathing is present.    Lymphatic: No  lymphadenopathy about the head, neck, or axilla .  Eyes: No conjunctival inflammation or lid edema is present. There is no scleral icterus.  Ears:  External ear exam shows no significant lesions or deformities.    Nose:  External nasal examination shows no deformity or inflammation. Nasal mucosa are pink and moist without lesions or exudates No septal dislocation or deviation.No obstruction to airflow.   Oral exam:  lips and gums are healthy appearing.There is no oropharyngeal erythema or exudate .  Neck:  No deformities, thyromegaly, masses, or tenderness noted.   Supple with full range of motion without pain.   Heart:  Normal rate and regular rhythm. S1 and S2 normal without gallop, murmur, click, rub or other extra sounds.   Lungs:Chest clear to auscultation; no wheezes,  rhonchi,rales ,or rubs present.  Extremities:  No cyanosis, edema, or clubbing  noted    Skin: Warm & dry w/o tenting or jaundice. No significant lesions or rash.        Assessment & Plan:  #1 excessive wax accumulation without definite impaction.  #2 history of paroxysmal atrial flutter; he is in normal sinus rhythm clinically at this time  Plan: gavage and removal of wax bilaterally. Preventative long-term program described in the after visit summary.

## 2014-08-29 ENCOUNTER — Ambulatory Visit (INDEPENDENT_AMBULATORY_CARE_PROVIDER_SITE_OTHER): Payer: Medicare Other | Admitting: *Deleted

## 2014-08-29 DIAGNOSIS — Z5181 Encounter for therapeutic drug level monitoring: Secondary | ICD-10-CM

## 2014-08-29 DIAGNOSIS — I4892 Unspecified atrial flutter: Secondary | ICD-10-CM | POA: Diagnosis not present

## 2014-08-29 LAB — POCT INR: INR: 2.6

## 2014-09-29 ENCOUNTER — Ambulatory Visit (INDEPENDENT_AMBULATORY_CARE_PROVIDER_SITE_OTHER): Payer: Medicare Other | Admitting: Internal Medicine

## 2014-09-29 ENCOUNTER — Encounter: Payer: Self-pay | Admitting: Internal Medicine

## 2014-09-29 VITALS — BP 122/66 | HR 73 | Temp 98.4°F | Ht 72.0 in | Wt 174.0 lb

## 2014-09-29 DIAGNOSIS — K5900 Constipation, unspecified: Secondary | ICD-10-CM | POA: Diagnosis not present

## 2014-09-29 DIAGNOSIS — I10 Essential (primary) hypertension: Secondary | ICD-10-CM

## 2014-09-29 DIAGNOSIS — F411 Generalized anxiety disorder: Secondary | ICD-10-CM

## 2014-09-29 MED ORDER — DOCUSATE SODIUM 100 MG PO CAPS: 100.0000 mg | ORAL_CAPSULE | Freq: Two times a day (BID) | ORAL | Status: DC

## 2014-09-29 MED ORDER — POLYETHYLENE GLYCOL 3350 17 GM/SCOOP PO POWD: 1.0000 | Freq: Once | ORAL | Status: DC

## 2014-09-29 NOTE — Patient Instructions (Signed)
Ok to stop the metamucil for now  Please take all new medication as prescribed   - the miralax daily, as well as the Colace (stool softner) 100 mg twice per day  -  BOTH sent to your pharmacy  Please continue all other medications as before, and refills have been done if requested.  Please have the pharmacy call with any other refills you may need.  Please keep your appointments with your specialists as you may have planned

## 2014-09-29 NOTE — Assessment & Plan Note (Signed)
stable overall by history and exam, recent data reviewed with pt, and pt to continue medical treatment as before,  to f/u any worsening symptoms or concerns BP Readings from Last 3 Encounters:  09/29/14 122/66  08/18/14 124/80  05/31/14 124/84

## 2014-09-29 NOTE — Progress Notes (Signed)
Pre visit review using our clinic review tool, if applicable. No additional management support is needed unless otherwise documented below in the visit note. 

## 2014-09-29 NOTE — Assessment & Plan Note (Addendum)
Mild chronic ongoing, o/ w stable overall by history and exam, recent data reviewed with pt, and pt to continue medical treatment as before,  to f/u any worsening symptoms or concerns Lab Results  Component Value Date   WBC 6.5 05/31/2014   HGB 15.6 05/31/2014   HCT 45.9 05/31/2014   PLT 258.0 05/31/2014   GLUCOSE 103* 05/31/2014   CHOL 211* 05/31/2014   TRIG 71.0 05/31/2014   HDL 54.60 05/31/2014   LDLDIRECT 159.2 09/29/2006   LDLCALC 142* 05/31/2014   ALT 19 05/31/2014   AST 22 05/31/2014   NA 136 05/31/2014   K 4.2 05/31/2014   CL 102 05/31/2014   CREATININE 1.31 05/31/2014   BUN 11 05/31/2014   CO2 29 05/31/2014   TSH 0.85 05/31/2014   PSA 1.96 03/16/2009   INR 2.6 08/29/2014   HGBA1C 5.7 05/31/2014

## 2014-09-29 NOTE — Progress Notes (Signed)
Subjective:    Patient ID: Jonathan Orozco, male    DOB: 10-29-1928, 81 y.o.   MRN: [id number]  HPI  Here to f/u; overall doing ok,  Pt denies chest pain, increasing sob or doe, wheezing, orthopnea, PND, increased LE swelling, palpitations, dizziness or syncope.  Pt denies new neurological symptoms such as new headache, or facial or extremity weakness or numbness.  Pt denies polydipsia, polyuria, or low sugar episode.   Pt denies new neurological symptoms such as new headache, or facial or extremity weakness or numbness.   Pt states overall good compliance with meds, mostly trying to follow appropriate diet, with wt overall stable,  but little exercise however. Having significant dificulty passing stool due to consistency change - seems like clay or "balls", did have one relatively normal consitency x 1 last wk, but keeps recurring, has been on ongoing problem, seen per GI, tx with metamucil, and probiotic but not working out. Despite age he is concerned enough to want colonoscopy. Denies worsening reflux, dysphagia, n/v, or blood. Denies worsening depressive symptoms, suicidal ideation, or panic; has ongoing anxiety,  Past Medical History  Diagnosis Date  . MELANOMA, MALIGNANT, SKIN NOS 09/29/2006  . HYPERLIPIDEMIA 09/29/2006  . ERECTILE DYSFUNCTION 09/29/2006  . 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 ABUSE, ALCOHOL, IN REMISSION 04/08/2007  . Other specified forms of hearing loss 08/10/2009  . HYPERTENSION 09/29/2006  . VENTRICULAR TACHYCARDIA 09/29/2006  . SINUSITIS- ACUTE-NOS 06/23/2007  . ALLERGIC RHINITIS 09/29/2006  . BENIGN PROSTATIC HYPERTROPHY 09/29/2006  . OSTEOARTHROSIS NOS, OTHER [facility] SITE 09/29/2006  . BACK PAIN 09/12/2008  . LEG PAIN, RIGHT 10/02/2008  . VERTIGO 04/08/2007  . COLONIC POLYPS, HX OF 06/23/2007  . CHEST PAIN 07/04/2008  . FATIGUE 11/16/2009  . VIRAL GASTROENTERITIS 01/29/2010  . GLUCOSE INTOLERANCE 03/19/2010  . Atrial flutter 03/28/2010    a. s/p prior rfca;  b. recurrent  paroxysmal flutter 04/2012;  c. pradaxa initiated 04/2012.  [name] Long term (current) use of anticoagulants 04/26/2010  . Dysrhythmia, cardiac 2012    atrial flutter  . Diverticulosis    Past Surgical History  Procedure Laterality Date  . Left rotato cuff    . Joint replacement      Right knee  . Tonsillectomy    . Left knee arthroscopy      reports that he has quit smoking. He has never used smokeless tobacco. He reports that he does not drink alcohol or use illicit drugs. family history includes Diabetes in his brother; Esophageal cancer in his father. There is no history of Colon cancer or Stomach cancer. Allergies  Allergen Reactions  . Ace Inhibitors     REACTION: cough  . Amiodarone Hcl     REACTION: intolerance   Current Outpatient Prescriptions on File Prior to Visit  Medication Sig Dispense Refill  . diltiazem (MATZIM LA) 360 MG 24 hr tablet Take 1 tablet (360 mg total) by mouth daily. 90 tablet 3  . fish oil-omega-3 fatty acids 1000 MG capsule Take 1 g by mouth daily.     [name] HYDROcodone-acetaminophen (NORCO/VICODIN) 5-325 MG per tablet Take 1 tablet by mouth daily as needed. 90 tablet 0  . Magnesium 400 MG CAPS Take 1 capsule by mouth daily.    . metoprolol succinate (TOPROL-XL) 100 MG 24 hr tablet Take 1 tablet (100 mg total) by mouth daily. Take with or immediately following a meal. 90 tablet 2  . Multiple Vitamin (MULTIVITAMIN) tablet Take 1 tablet by mouth daily.      [name]  Probiotic Product (RESTORA) CAPS Take 1 capsule by mouth daily. 10 capsule 0  . warfarin (COUMADIN) 5 MG tablet Take as directed by anticoagulation clinic 150 tablet 1   No current facility-administered medications on file prior to visit.    Review of Systems  Constitutional: Negative for unusual diaphoresis or night sweats HENT: Negative for ringing in ear or discharge Eyes: Negative for double vision or worsening visual disturbance.  Respiratory: Negative for choking and stridor.   Gastrointestinal:  Negative for vomiting or other signifcant bowel change Genitourinary: Negative for hematuria or change in urine volume.  Musculoskeletal: Negative for other MSK pain or swelling Skin: Negative for color change and worsening wound.  Neurological: Negative for tremors and numbness other than noted  Psychiatric/Behavioral: Negative for decreased concentration or agitation other than above       Objective:   Physical Exam BP 122/66 mmHg  Pulse 73  Temp(Src) 98.4 F (36.9 C) (Oral)  Ht 6' (1.829 m)  Wt 174 lb (78.926 kg)  BMI 23.59 kg/m2  SpO2 97% VS noted,  Constitutional: Pt appears in no significant distress HENT: Head: NCAT.  Right Ear: External ear normal.  Left Ear: External ear normal.  Eyes: . Pupils are equal, round, and reactive to light. Conjunctivae and EOM are normal Neck: Normal range of motion. Neck supple.  Cardiovascular: Normal rate and regular rhythm.   Pulmonary/Chest: Effort normal and breath sounds without rales or wheezing.  Abd:  Soft, NT, ND, + BS Neurological: Pt is alert. Not confused , motor grossly intact Skin: Skin is warm. No rash, no LE edema Psychiatric: Pt behavior is normal. No agitation. 1+ nervous    Assessment & Plan:

## 2014-09-29 NOTE — Assessment & Plan Note (Signed)
Quite distressing to pt, to try change to miralax daily and colace 100 bid,  to f/u any worsening symptoms or concerns

## 2014-10-10 ENCOUNTER — Ambulatory Visit (INDEPENDENT_AMBULATORY_CARE_PROVIDER_SITE_OTHER): Payer: Medicare Other | Admitting: Pharmacist

## 2014-10-10 DIAGNOSIS — Z5181 Encounter for therapeutic drug level monitoring: Secondary | ICD-10-CM

## 2014-10-10 DIAGNOSIS — I4892 Unspecified atrial flutter: Secondary | ICD-10-CM

## 2014-10-10 LAB — POCT INR: INR: 2.4

## 2014-10-24 ENCOUNTER — Telehealth: Payer: Self-pay | Admitting: Internal Medicine

## 2014-10-24 MED ORDER — HYDROCODONE-ACETAMINOPHEN 5-325 MG PO TABS: 1.0000 | ORAL_TABLET | Freq: Every day | ORAL | Status: DC | PRN

## 2014-10-24 NOTE — Telephone Encounter (Signed)
Patient is requesting a refill of HYDROcodone-acetaminophen (NORCO/VICODIN) 5-325 MG per tablet [[id number]]

## 2014-10-24 NOTE — Telephone Encounter (Signed)
Done hardcopy to Dahlia  

## 2014-10-25 NOTE — Telephone Encounter (Signed)
Pt informed, Rx in cabinet for pt pick up  

## 2014-10-26 ENCOUNTER — Ambulatory Visit (INDEPENDENT_AMBULATORY_CARE_PROVIDER_SITE_OTHER): Payer: Medicare Other | Admitting: Internal Medicine

## 2014-10-26 ENCOUNTER — Encounter: Payer: Self-pay | Admitting: Internal Medicine

## 2014-10-26 VITALS — BP 132/80 | HR 68 | Temp 97.6°F | Ht 72.0 in | Wt 173.0 lb

## 2014-10-26 DIAGNOSIS — H9193 Unspecified hearing loss, bilateral: Secondary | ICD-10-CM | POA: Diagnosis not present

## 2014-10-26 DIAGNOSIS — R195 Other fecal abnormalities: Secondary | ICD-10-CM | POA: Diagnosis not present

## 2014-10-26 DIAGNOSIS — E785 Hyperlipidemia, unspecified: Secondary | ICD-10-CM | POA: Diagnosis not present

## 2014-10-26 DIAGNOSIS — I10 Essential (primary) hypertension: Secondary | ICD-10-CM | POA: Diagnosis not present

## 2014-10-26 NOTE — Progress Notes (Signed)
Pre visit review using our clinic review tool, if applicable. No additional management support is needed unless otherwise documented below in the visit note. 

## 2014-10-26 NOTE — Progress Notes (Signed)
Subjective:    Patient ID: Jonathan Orozco, male    DOB: [birth year], 81 y.o.   MRN: [id number]  HPI    Here to f/u; overall doing ok,  Pt denies chest pain, increasing sob or doe, wheezing, orthopnea, PND, increased LE swelling, palpitations, dizziness or syncope.  Pt denies new neurological symptoms such as new headache, or facial or extremity weakness or numbness.  Pt denies polydipsia, polyuria, or low sugar episode.   Pt denies new neurological symptoms such as new headache, or facial or extremity weakness or numbness.   Pt states overall good compliance with meds, mostly trying to follow appropriate diet, with wt overall stable,  but little exercise however.  Did have a FOBT testing he initiated to be Positive.  Asks for Dr Perry/GI. Last colonoscopy 2009 with polyps. Had some bilat hearing loss, better with irrigation of ears of wax impactions.   Did have LDL higher last visit he thinks now b/c he was trying a low carb diet which was then more in fat/chol, has been trying to do better with lower chol in diet since last visit Past Medical History  Diagnosis Date  . MELANOMA, MALIGNANT, SKIN NOS 09/29/2006  . HYPERLIPIDEMIA 09/29/2006  . ERECTILE DYSFUNCTION 09/29/2006  . 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 ABUSE, ALCOHOL, IN REMISSION 04/08/2007  . Other specified forms of hearing loss 08/10/2009  . HYPERTENSION 09/29/2006  . VENTRICULAR TACHYCARDIA 09/29/2006  . SINUSITIS- ACUTE-NOS 06/23/2007  . ALLERGIC RHINITIS 09/29/2006  . BENIGN PROSTATIC HYPERTROPHY 09/29/2006  . OSTEOARTHROSIS NOS, OTHER [facility] SITE 09/29/2006  . BACK PAIN 09/12/2008  . LEG PAIN, RIGHT 10/02/2008  . VERTIGO 04/08/2007  . COLONIC POLYPS, HX OF 06/23/2007  . CHEST PAIN 07/04/2008  . FATIGUE 11/16/2009  . VIRAL GASTROENTERITIS 01/29/2010  . GLUCOSE INTOLERANCE 03/19/2010  . Atrial flutter 03/28/2010    a. s/p prior rfca;  b. recurrent paroxysmal flutter 04/2012;  c. pradaxa initiated 04/2012.  [name] Long term (current) use of  anticoagulants 04/26/2010  . Dysrhythmia, cardiac 2012    atrial flutter  . Diverticulosis    Past Surgical History  Procedure Laterality Date  . Left rotato cuff    . Joint replacement      Right knee  . Tonsillectomy    . Left knee arthroscopy      reports that he has quit smoking. He has never used smokeless tobacco. He reports that he does not drink alcohol or use illicit drugs. family history includes Diabetes in his brother; Esophageal cancer in his father. There is no history of Colon cancer or Stomach cancer. Allergies  Allergen Reactions  . Ace Inhibitors     REACTION: cough  . Amiodarone Hcl     REACTION: intolerance   Current Outpatient Prescriptions on File Prior to Visit  Medication Sig Dispense Refill  . diltiazem (MATZIM LA) 360 MG 24 hr tablet Take 1 tablet (360 mg total) by mouth daily. 90 tablet 3  . fish oil-omega-3 fatty acids 1000 MG capsule Take 1 g by mouth daily.     [name] HYDROcodone-acetaminophen (NORCO/VICODIN) 5-325 MG per tablet Take 1 tablet by mouth daily as needed. 90 tablet 0  . metoprolol succinate (TOPROL-XL) 100 MG 24 hr tablet Take 1 tablet (100 mg total) by mouth daily. Take with or immediately following a meal. 90 tablet 2  . Multiple Vitamin (MULTIVITAMIN) tablet Take 1 tablet by mouth daily.      . polyethylene glycol powder (GLYCOLAX/MIRALAX) powder Take 255 g  by mouth once. 500 g 2  . Probiotic Product (RESTORA) CAPS Take 1 capsule by mouth daily. 10 capsule 0  . warfarin (COUMADIN) 5 MG tablet Take as directed by anticoagulation clinic 150 tablet 1  . docusate sodium (COLACE) 100 MG capsule Take 1 capsule (100 mg total) by mouth 2 (two) times daily. (Patient not taking: Reported on 10/26/2014) 60 capsule 5   No current facility-administered medications on file prior to visit.   Review of Systems  Constitutional: Negative for unusual diaphoresis or night sweats HENT: Negative for ringing in ear or discharge Eyes: Negative for double vision  or worsening visual disturbance.  Respiratory: Negative for choking and stridor.   Gastrointestinal: Negative for vomiting or other signifcant bowel change Genitourinary: Negative for hematuria or change in urine volume.  Musculoskeletal: Negative for other MSK pain or swelling Skin: Negative for color change and worsening wound.  Neurological: Negative for tremors and numbness other than noted  Psychiatric/Behavioral: Negative for decreased concentration or agitation other than above       Objective:   Physical Exam BP 132/80 mmHg  Pulse 68  Temp(Src) 97.6 F (36.4 C) (Oral)  Ht 6' (1.829 m)  Wt 173 lb (78.472 kg)  BMI 23.46 kg/m2  SpO2 67% VS noted,  Constitutional: Pt appears in no significant distress HENT: Head: NCAT.  Right Ear: External ear normal.  Left Ear: External ear normal.  Eyes: . Pupils are equal, round, and reactive to light. Conjunctivae and EOM are normal Neck: Normal range of motion. Neck supple.  Cardiovascular: Normal rate and regular rhythm.   Pulmonary/Chest: Effort normal and breath sounds without rales or wheezing.  Abd:  Soft, NT, ND, + BS Neurological: Pt is alert. Not confused , motor grossly intact Skin: Skin is warm. No rash, no LE edema Psychiatric: Pt behavior is normal. No agitation.     Assessment & Plan:

## 2014-10-26 NOTE — Assessment & Plan Note (Signed)
D/w pt  - urged lower chol diet, o/w stable overall by history and exam, recent data reviewed with pt, and pt to continue medical treatment as before,  to f/u any worsening symptoms or concerns, for f/u labs today

## 2014-10-26 NOTE — Assessment & Plan Note (Signed)
Pt now at higher risk given age, but is requesting referral to Dr Henrene Pastor to discuss heme pos stool, given hx of polyps

## 2014-10-26 NOTE — Assessment & Plan Note (Signed)
Improved s/p irriagation bilat ear canal wax impactions,

## 2014-10-26 NOTE — Patient Instructions (Signed)
Please continue all other medications as before, and refills have been done if requested.  Please have the pharmacy call with any other refills you may need.  Please continue your efforts at being more active, low cholesterol diet, and weight control.  You are otherwise up to date with prevention measures today.  Please keep your appointments with your specialists as you may have planned  You will be contacted regarding the referral for: Gastroenterology - Dr Henrene Pastor  Please go to the LAB in the Basement (turn left off the elevator) for the tests to be done today  You will be contacted by phone if any changes need to be made immediately.  Otherwise, you will receive a letter about your results with an explanation, but please check with MyChart first.  Please remember to sign up for MyChart if you have not done so, as this will be important to you in the future with finding out test results, communicating by private email, and scheduling acute appointments online when needed.  Please return in 6 months, or sooner if needed

## 2014-10-26 NOTE — Assessment & Plan Note (Signed)
stable overall by history and exam, recent data reviewed with pt, and pt to continue medical treatment as before,  to f/u any worsening symptoms or concerns BP Readings from Last 3 Encounters:  10/26/14 132/80  09/29/14 122/66  08/18/14 124/80

## 2014-10-27 ENCOUNTER — Other Ambulatory Visit (INDEPENDENT_AMBULATORY_CARE_PROVIDER_SITE_OTHER): Payer: Medicare Other

## 2014-10-27 DIAGNOSIS — E785 Hyperlipidemia, unspecified: Secondary | ICD-10-CM

## 2014-10-27 LAB — LIPID PANEL
CHOL/HDL RATIO: 3
Cholesterol: 195 mg/dL (ref 0–200)
HDL: 57.2 mg/dL (ref >39.00)
LDL Cholesterol: 129 mg/dL — ABNORMAL HIGH (ref 0–99)
NONHDL: 137.82
Triglycerides: 46 mg/dL (ref 0.0–149.0)
VLDL: 9.2 mg/dL (ref 0.0–40.0)

## 2014-10-27 LAB — HEPATIC FUNCTION PANEL
ALK PHOS: 97 U/L (ref 39–117)
ALT: 17 U/L (ref 0–53)
AST: 22 U/L (ref 0–37)
Albumin: 4.1 g/dL (ref 3.5–5.2)
BILIRUBIN DIRECT: 0.2 mg/dL (ref 0.0–0.3)
BILIRUBIN TOTAL: 0.9 mg/dL (ref 0.2–1.2)
Total Protein: 6.9 g/dL (ref 6.0–8.3)

## 2014-10-27 LAB — BASIC METABOLIC PANEL
BUN: 16 mg/dL (ref 6–23)
CALCIUM: 9.2 mg/dL (ref 8.4–10.5)
CHLORIDE: 100 meq/L (ref 96–112)
CO2: 29 meq/L (ref 19–32)
CREATININE: 0.83 mg/dL (ref 0.40–1.50)
GFR: 93.65 mL/min (ref >60.00)
Glucose, Bld: 125 mg/dL — ABNORMAL HIGH (ref 70–99)
Potassium: 4 mEq/L (ref 3.5–5.1)
SODIUM: 136 meq/L (ref 135–145)

## 2014-10-31 ENCOUNTER — Telehealth: Payer: Self-pay | Admitting: Internal Medicine

## 2014-10-31 DIAGNOSIS — R195 Other fecal abnormalities: Secondary | ICD-10-CM

## 2014-10-31 DIAGNOSIS — Z86010 Personal history of colonic polyps: Secondary | ICD-10-CM

## 2014-10-31 NOTE — Telephone Encounter (Signed)
Pt called in and Dr. Hilarie Fredrickson can't see him till November so he is going to see Dr. Collene Mares on [facility].  He needs a referral sent to his office

## 2014-11-01 ENCOUNTER — Other Ambulatory Visit: Payer: Self-pay | Admitting: Internal Medicine

## 2014-11-01 MED ORDER — ATORVASTATIN CALCIUM 10 MG PO TABS: 10.0000 mg | ORAL_TABLET | Freq: Every day | ORAL | Status: DC

## 2014-11-01 NOTE — Telephone Encounter (Signed)
Referral done

## 2014-11-10 ENCOUNTER — Emergency Department (HOSPITAL_COMMUNITY): Payer: Medicare Other

## 2014-11-10 ENCOUNTER — Encounter (HOSPITAL_COMMUNITY): Payer: Self-pay | Admitting: Nurse Practitioner

## 2014-11-10 ENCOUNTER — Emergency Department (HOSPITAL_COMMUNITY)
Admission: EM | Admit: 2014-11-10 | Discharge: 2014-11-10 | Disposition: A | Discharge status: Home / Self Care | Payer: Medicare Other | Attending: Emergency Medicine | Admitting: Emergency Medicine

## 2014-11-10 DIAGNOSIS — R000 Tachycardia, unspecified: Secondary | ICD-10-CM | POA: Diagnosis present

## 2014-11-10 DIAGNOSIS — Z87438 Personal history of other diseases of male genital organs: Secondary | ICD-10-CM | POA: Diagnosis not present

## 2014-11-10 DIAGNOSIS — Z8709 Personal history of other diseases of the respiratory system: Secondary | ICD-10-CM | POA: Insufficient documentation

## 2014-11-10 DIAGNOSIS — I10 Essential (primary) hypertension: Secondary | ICD-10-CM | POA: Diagnosis not present

## 2014-11-10 DIAGNOSIS — M1990 Unspecified osteoarthritis, unspecified site: Secondary | ICD-10-CM | POA: Diagnosis not present

## 2014-11-10 DIAGNOSIS — Z8669 Personal history of other diseases of the nervous system and sense organs: Secondary | ICD-10-CM | POA: Diagnosis not present

## 2014-11-10 DIAGNOSIS — Z8719 Personal history of other diseases of the digestive system: Secondary | ICD-10-CM | POA: Insufficient documentation

## 2014-11-10 DIAGNOSIS — E785 Hyperlipidemia, unspecified: Secondary | ICD-10-CM | POA: Insufficient documentation

## 2014-11-10 DIAGNOSIS — I472 Ventricular tachycardia: Secondary | ICD-10-CM | POA: Diagnosis not present

## 2014-11-10 DIAGNOSIS — Z87891 Personal history of nicotine dependence: Secondary | ICD-10-CM | POA: Diagnosis not present

## 2014-11-10 DIAGNOSIS — Z86010 Personal history of colonic polyps: Secondary | ICD-10-CM | POA: Insufficient documentation

## 2014-11-10 DIAGNOSIS — Z7901 Long term (current) use of anticoagulants: Secondary | ICD-10-CM | POA: Diagnosis not present

## 2014-11-10 DIAGNOSIS — Z8619 Personal history of other infectious and parasitic diseases: Secondary | ICD-10-CM | POA: Insufficient documentation

## 2014-11-10 DIAGNOSIS — Z85820 Personal history of malignant melanoma of skin: Secondary | ICD-10-CM | POA: Diagnosis not present

## 2014-11-10 DIAGNOSIS — Z792 Long term (current) use of antibiotics: Secondary | ICD-10-CM | POA: Diagnosis not present

## 2014-11-10 DIAGNOSIS — I483 Typical atrial flutter: Secondary | ICD-10-CM | POA: Diagnosis not present

## 2014-11-10 DIAGNOSIS — Z791 Long term (current) use of non-steroidal anti-inflammatories (NSAID): Secondary | ICD-10-CM | POA: Insufficient documentation

## 2014-11-10 DIAGNOSIS — Z79899 Other long term (current) drug therapy: Secondary | ICD-10-CM | POA: Diagnosis not present

## 2014-11-10 LAB — BASIC METABOLIC PANEL
Anion gap: 8 (ref 5–15)
BUN: 10 mg/dL (ref 6–20)
CHLORIDE: 100 mmol/L — AB (ref 101–111)
CO2: 27 mmol/L (ref 22–32)
CREATININE: 0.87 mg/dL (ref 0.61–1.24)
Calcium: 9.5 mg/dL (ref 8.9–10.3)
GFR calc non Af Amer: >60 mL/min (ref >60)
Glucose, Bld: 115 mg/dL — ABNORMAL HIGH (ref 65–99)
POTASSIUM: 4.2 mmol/L (ref 3.5–5.1)
SODIUM: 135 mmol/L (ref 135–145)

## 2014-11-10 LAB — CBC
HEMATOCRIT: 47.1 % (ref 39.0–52.0)
Hemoglobin: 16 g/dL (ref 13.0–17.0)
MCH: 31.6 pg (ref 26.0–34.0)
MCHC: 34 g/dL (ref 30.0–36.0)
MCV: 93.1 fL (ref 78.0–100.0)
PLATELETS: 253 10*3/uL (ref 150–400)
RBC: 5.06 MIL/uL (ref 4.22–5.81)
RDW: 13 % (ref 11.5–15.5)
WBC: 7 10*3/uL (ref 4.0–10.5)

## 2014-11-10 LAB — PROTIME-INR
INR: 2.02 — ABNORMAL HIGH (ref 0.00–1.49)
Prothrombin Time: 22.7 seconds — ABNORMAL HIGH (ref 11.6–15.2)

## 2014-11-10 LAB — MAGNESIUM: MAGNESIUM: 2.1 mg/dL (ref 1.7–2.4)

## 2014-11-10 LAB — APTT: APTT: 45 s — AB (ref 24–37)

## 2014-11-10 MED ORDER — ETOMIDATE 2 MG/ML IV SOLN
INTRAVENOUS | Status: AC | PRN
  Administered 2014-11-10: 10 mg via INTRAVENOUS

## 2014-11-10 MED ORDER — ETOMIDATE 2 MG/ML IV SOLN
10.0000 mg | Freq: Once | INTRAVENOUS | Status: DC
  Filled 2014-11-10: qty 10

## 2014-11-10 MED ORDER — SODIUM CHLORIDE 0.9 % IV BOLUS (SEPSIS)
500.0000 mL | Freq: Once | INTRAVENOUS | Status: AC
  Administered 2014-11-10: 500 mL via INTRAVENOUS

## 2014-11-10 NOTE — ED Provider Notes (Signed)
CSN: [id number]     Arrival date & time 11/10/14  1344 History   First MD Initiated Contact with Patient 11/10/14 1414     Chief Complaint  Patient presents with  . Tachycardia     (Consider location/radiation/quality/duration/timing/severity/associated sxs/prior Treatment) HPI  81 year old male who presents with palpitations. History of atrial flutter on Coumadin , hypertension, and hyperlipidemia. Reports an onset of palpitations and feeling anxious yesterday evening at 6 PM while getting ready to go to the beach. His symptoms typical of his atrial flutter and came to the ED today for evaluation. Denies shortness of breath, chest pain, fatigue , syncope or near syncope. He has otherwise been in his usual state of health. No nausea, vomiting, diarrhea, or URI symptoms. Previously seen April 2015 for recurrent atrial flutter and was cardioverted.   Past Medical History  Diagnosis Date  . MELANOMA, MALIGNANT, SKIN NOS 09/29/2006  . HYPERLIPIDEMIA 09/29/2006  . ERECTILE DYSFUNCTION 09/29/2006  . 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 ABUSE, ALCOHOL, IN REMISSION 04/08/2007  . Other specified forms of hearing loss 08/10/2009  . HYPERTENSION 09/29/2006  . VENTRICULAR TACHYCARDIA 09/29/2006  . SINUSITIS- ACUTE-NOS 06/23/2007  . ALLERGIC RHINITIS 09/29/2006  . BENIGN PROSTATIC HYPERTROPHY 09/29/2006  . OSTEOARTHROSIS NOS, OTHER [facility] SITE 09/29/2006  . BACK PAIN 09/12/2008  . LEG PAIN, RIGHT 10/02/2008  . VERTIGO 04/08/2007  . COLONIC POLYPS, HX OF 06/23/2007  . CHEST PAIN 07/04/2008  . FATIGUE 11/16/2009  . VIRAL GASTROENTERITIS 01/29/2010  . GLUCOSE INTOLERANCE 03/19/2010  . Atrial flutter ([place]) 03/28/2010    a. s/p prior rfca;  b. recurrent paroxysmal flutter 04/2012;  c. pradaxa initiated 04/2012.  [name] Long term (current) use of anticoagulants 04/26/2010  . Dysrhythmia, cardiac 2012    atrial flutter  . Diverticulosis    Past Surgical History  Procedure Laterality Date  . Left rotato cuff    .  Joint replacement      Right knee  . Tonsillectomy    . Left knee arthroscopy     Family History  Problem Relation Age of Onset  . Diabetes Brother   . Esophageal cancer Father   . Colon cancer Neg Hx   . Stomach cancer Neg Hx    Social History  Substance Use Topics  . Smoking status: Former Research scientist (life sciences)  . Smokeless tobacco: Never Used     Comment: Quit smoking and drinking 20 years ago  . Alcohol Use: No    Review of Systems 10/14 systems reviewed and are negative other than those stated in the HPI   Allergies  Ace inhibitors and Amiodarone hcl  Home Medications   Prior to Admission medications   Medication Sig Start Date End Date Taking? Authorizing Provider  diltiazem (MATZIM LA) 360 MG 24 hr tablet Take 1 tablet (360 mg total) by mouth daily. 03/06/14  Yes Deboraha Sprang, MD  fish oil-omega-3 fatty acids 1000 MG capsule Take 1 g by mouth daily.    Yes Historical Provider, MD  HYDROcodone-acetaminophen (NORCO/VICODIN) 5-325 MG per tablet Take 1 tablet by mouth daily as needed. 10/24/14  Yes Biagio Borg, MD  LORazepam (ATIVAN) 0.5 MG tablet Take 0.5 mg by mouth every 6 (six) hours as needed for anxiety.   Yes Historical Provider, MD  metoprolol succinate (TOPROL-XL) 100 MG 24 hr tablet Take 1 tablet (100 mg total) by mouth daily. Take with or immediately following a meal. Patient taking differently: Take 50 mg by mouth daily. Take with or immediately following a  meal. 08/17/14  Yes Deboraha Sprang, MD  Multiple Vitamin (MULTIVITAMIN) tablet Take 1 tablet by mouth daily.     Yes Historical Provider, MD  polyethylene glycol powder (GLYCOLAX/MIRALAX) powder Take 255 g by mouth once. Patient taking differently: Take 1 Container by mouth daily.  09/29/14  Yes Biagio Borg, MD  Probiotic Product [facility]) CAPS Take 1 capsule by mouth daily. 12/08/12  Yes Irene Shipper, MD  psyllium (METAMUCIL) 58.6 % packet Take 1 packet by mouth daily.   Yes Historical Provider, MD  warfarin (COUMADIN) 5  MG tablet Take as directed by anticoagulation clinic Patient taking differently: Take 5-7.5 mg by mouth See admin instructions. Pt takes 5mg  on Tuesday and Thursday - pt takes 7,5mg  all other days 03/14/14  Yes Deboraha Sprang, MD  atorvastatin (LIPITOR) 10 MG tablet Take 1 tablet (10 mg total) by mouth daily. Patient not taking: Reported on 11/10/2014 11/01/14   Biagio Borg, MD  docusate sodium (COLACE) 100 MG capsule Take 1 capsule (100 mg total) by mouth 2 (two) times daily. Patient not taking: Reported on 10/26/2014 09/29/14   Biagio Borg, MD   BP 111/69 mmHg  Pulse 66  Temp(Src) 98.2 F (36.8 C) (Oral)  Resp 14  SpO2 100% Physical Exam Physical Exam  Nursing note and vitals reviewed. Constitutional: Well developed, well nourished, non-toxic, and in no acute distress Head: Normocephalic and atraumatic.  Mouth/Throat: Oropharynx is clear and moist.  Neck: Normal range of motion. Neck supple.  Cardiovascular: Tachycardic rate and regular rhythm.  No edema. Pulmonary/Chest: Effort normal and breath sounds normal.  Abdominal: Soft. There is no tenderness. There is no rebound and no guarding.  Musculoskeletal: Normal range of motion.  Neurological: Alert, no facial droop, fluent speech, moves all extremities symmetrically Skin: Skin is warm and dry.  Psychiatric: Cooperative  ED Course  .Sedation Date/Time: 11/10/2014 4:45 PM Performed by: Brantley Stage DUO Authorized by: Brantley Stage DUO  Consent:    Consent obtained:  Verbal   Consent given by:  Patient   Risks discussed:  Dysrhythmia Indications:    Sedation purpose:  Cardioversion   Procedure necessitating sedation performed by:  Physician performing sedation   Intended level of sedation:  Moderate (conscious sedation) Pre-sedation assessment:    NPO status caution: urgency dictates proceeding with non-ideal NPO status     ASA classification: class 2 - patient with mild systemic disease     Neck mobility: normal     Mouth opening:   3 or more finger widths   Thyromental distance:  4 finger widths   Mallampati score:  I - soft palate, uvula, fauces, pillars visible   Pre-sedation assessments completed and reviewed: airway patency, cardiovascular function, hydration status, mental status, nausea/vomiting, pain level, respiratory function and temperature   Immediate pre-procedure details:    Reassessment: Patient reassessed immediately prior to procedure     Reviewed: vital signs     Verified: bag valve mask available, emergency equipment available, intubation equipment available, IV patency confirmed, oxygen available, reversal medications available and suction available   Procedure details (see MAR for exact dosages):    Preoxygenation:  Room air and nasal cannula   Sedation:  Etomidate (10 mg)   Intra-procedure monitoring:  Blood pressure monitoring, continuous capnometry, frequent LOC assessments, cardiac monitor, frequent vital sign checks and continuous pulse oximetry   Intra-procedure events: none   Post-procedure details:    Attendance: Constant attendance by certified staff until patient recovered  Recovery: Patient returned to pre-procedure baseline     Post-sedation assessments completed and reviewed: airway patency, cardiovascular function, mental status, nausea/vomiting, pain level and respiratory function     Patient is stable for discharge or admission: Yes     Patient tolerance:  Tolerated well, no immediate complications .Cardioversion Date/Time: 11/10/2014 4:48 PM Performed by: Brantley Stage DUO Authorized by: Brantley Stage DUO Consent: Verbal consent obtained. Risks and benefits: risks, benefits and alternatives were discussed Consent given by: patient Patient identity confirmed: verbally with patient Time out: Immediately prior to procedure a "time out" was called to verify the correct patient, procedure, equipment, support staff and site/side marked as required. Patient sedated: yes Sedatives:  etomidate Cardioversion basis: elective Indications: failure of anti-arrhythmic medications Pre-procedure rhythm: atrial flutter Patient position: patient was placed in a supine position Chest area: chest area exposed Electrodes: pads Electrodes placed: anterior-posterior Number of attempts: 1 Attempt 1 mode: synchronous Attempt 1 shock (in Joules): 100 Attempt 1 outcome: conversion to normal sinus rhythm Post-procedure rhythm: normal sinus rhythm Complications: no complications Patient tolerance: Patient tolerated the procedure well with no immediate complications   (including critical care time) Labs Review Labs Reviewed  BASIC METABOLIC PANEL - Abnormal; Notable for the following:    Chloride 100 (*)    Glucose, Bld 115 (*)    All other components within normal limits  PROTIME-INR - Abnormal; Notable for the following:    Prothrombin Time 22.7 (*)    INR 2.02 (*)    All other components within normal limits  APTT - Abnormal; Notable for the following:    aPTT 45 (*)    All other components within normal limits  CBC  MAGNESIUM    Imaging Review Dg Chest 2 View  11/10/2014   CLINICAL DATA:  Tachycardia.  History of previous pulmonary nodules.  EXAM: CHEST  2 VIEW  COMPARISON:  CT 06/12/2014 and 06/01/2013.  Radiography 05/20/2013.  FINDINGS: Heart size is normal. The aorta shows calcification and unfolding. Slightly abnormal interstitial markings are again seen without evidence of a focal mass by radiography. No infiltrate, collapse or effusion. Mild loss of height of a lower thoracic vertebral body again demonstrated.  IMPRESSION: No active disease. Slightly prominent interstitial markings. No evidence of focal mass.   Electronically Signed   By: Nelson Chimes M.D.   On: 11/10/2014 15:19   I have personally reviewed and evaluated these images and lab results as part of my medical decision-making.   EKG Interpretation   Date/Time:  Friday November 10 2014 13:57:19  EDT Ventricular Rate:  138 PR Interval:  128 QRS Duration: 118 QT Interval:  334 QTC Calculation: 506 R Axis:   87 Text Interpretation:  Atrial flutter 2:1 conduction Right bundle branch  block Previously in sinus rhythm Confirmed by Brylyn Novakovich MD, Hinton Dyer ([id number]) on  11/10/2014 2:44:38 PM       CRITICAL CARE Performed by: Forde Dandy   Total critical care time: 35 minutes  Critical care time was exclusive of separately billable procedures and treating other patients.  Critical care was necessary to treat or prevent imminent or life-threatening deterioration.  Critical care was time spent personally by me on the following activities: development of treatment plan with patient and/or surrogate as well as nursing, discussions with consultants, evaluation of patient's response to treatment, examination of patient, obtaining history from patient or surrogate, ordering and performing treatments and interventions, ordering and review of laboratory studies, ordering and review of radiographic studies, pulse oximetry and  re-evaluation of patient's condition.   MDM   Final diagnoses:  Typical atrial flutter (HCC)     History of paroxysmal atrial flutter on Coumadin who presents with recurrent atrial flutter. EKG suggests atrial flutter with 2-1 conduction. He is normotensive, mentating normally, and appears well perfused. Aside from palpitations, has no other symptoms.  Therapeutic INR on Coumadin. No major electrolyte derangements. Blood work otherwise unremarkable.  Patient subsequently cardioverted to normal sinus rhythm. Tolerated procedure and sedation well. Back to normal mental status and tolerating oral fluids and food without difficulty. It is felt appropriate for discharge home. He will call Cardiologist Monday for close follow-up. Strict return and follow-up instructions reviewed. He expressed understanding of all discharge instructions and felt comfortable with the plan of  care.     Forde Dandy, MD 11/10/14 [phone]

## 2014-11-10 NOTE — Sedation Documentation (Signed)
Patient cardioverted at 100J.  

## 2014-11-10 NOTE — Discharge Instructions (Signed)
Please call your cardiologist Monday for close follow-up next week. Return for worsening symptoms, including worsening pain, difficulty breathing, passing out, or any other symptoms concerning to you.  Atrial Flutter Atrial flutter is a heart rhythm that can cause the heart to beat very fast (tachycardia). It originates in the upper chambers of the heart (atria). In atrial flutter, the top chambers of the heart (atria) often beat much faster than the bottom chambers of the heart (ventricles). Atrial flutter has a regular "saw toothed" appearance in an EKG readout. An EKG is a test that records the electrical activity of the heart. Atrial flutter can cause the heart to beat up to 150 beats per minute (BPM). Atrial flutter can either be short lived (paroxysmal) or permanent.  CAUSES  Causes of atrial flutter can be many. Some of these include:  Heart related issues:  Heart attack (myocardial infarction).  Heart failure.  Heart valve problems.  Poorly controlled high blood pressure (hypertension).  After open heart surgery.  Lung related issues:  A blood clot in the lungs (pulmonary embolism).  Chronic obstructive pulmonary disease (COPD). Medications used to treat COPD can attribute to atrial flutter.  Other related causes:  Hyperthyroidism.  Caffeine.  Some decongestant cold medications.  Low electrolyte levels such as potassium or magnesium.  Cocaine. SYMPTOMS  An awareness of your heart beating rapidly (palpitations).  Shortness of breath.  Chest pain.  Low blood pressure (hypotension).  Dizziness or fainting. DIAGNOSIS  Different tests can be performed to diagnose atrial flutter.   An EKG.  Holter monitor. This is a 24-hour recording of your heart rhythm. You will also be given a diary. Write down all symptoms that you have and what you were doing at the time you experienced symptoms.  Cardiac event monitor. This small device can be worn for up to 30 days. When  you have heart symptoms, you will push a button on the device. This will then record your heart rhythm.  Echocardiogram. This is an imaging test to look at your heart. Your caregiver will look at your heart valves and the ventricles.  Stress test. This test can help determine if the atrial flutter is related to exercise or if coronary artery disease is present.  Laboratory studies will look at certain blood levels like:  Complete blood count (CBC).  Potassium.  Magnesium.  Thyroid function. TREATMENT  Treatment of atrial flutter varies. A combination of therapies may be used or sometimes atrial flutter may need only 1 type of treatment.  Lab work: If your blood work, such as your electrolytes (potassium, magnesium) or your thyroid function tests, are abnormal, your caregiver will treat them accordingly.  Medication:  There are several different types of medications that can convert your heart to a normal rhythm and prevent atrial flutter from reoccurring.  Nonsurgical procedures: Nonsurgical techniques may be used to control atrial flutter. Some examples include:  Cardioversion. This technique uses either drugs or an electrical shock to restore a normal heart rhythm:  Cardioversion drugs may be given through an intravenous (IV) line to help "reset" the heart rhythm.  In electrical cardioversion, your caregiver shocks your heart with electrical energy. This helps to reset the heartbeat to a normal rhythm.  Ablation. If atrial flutter is a persistent problem, an ablation may be needed. This procedure is done under mild sedation. High frequency radio-wave energy is used to destroy the area of heart tissue responsible for atrial flutter. SEEK IMMEDIATE MEDICAL CARE IF:  You have:  Dizziness.  Near fainting or fainting.  Shortness of breath.  Chest pain or pressure.  Sudden nausea or vomiting.  Profuse sweating. If you have the above symptoms, call your local emergency service  immediately! Do not drive yourself to the hospital. MAKE SURE YOU:   Understand these instructions.  Will watch your condition.  Will get help right away if you are not doing well or get worse.   This information is not intended to replace advice given to you by your health care provider. Make sure you discuss any questions you have with your health care provider.   Document Released: 06/08/2008 Document Revised: 02/10/2014 Document Reviewed: 08/04/2014 Elsevier Interactive Patient Education 2016 [employer].  Hospital doctor cardioversion is the delivery of a jolt of electricity to change the rhythm of the heart. Sticky patches or metal paddles are placed on the chest to deliver the electricity from a device. This is done to restore a normal rhythm. A rhythm that is too fast or not regular keeps the heart from pumping well. Electrical cardioversion is done in an emergency if:   There is low or no blood pressure as a result of the heart rhythm.   Normal rhythm must be restored as fast as possible to protect the brain and heart from further damage.   It may save a life. Cardioversion may be done for heart rhythms that are not immediately life threatening, such as atrial fibrillation or flutter, in which:   The heart is beating too fast or is not regular.   Medicine to change the rhythm has not worked.   It is safe to wait in order to allow time for preparation.  Symptoms of the abnormal rhythm are bothersome.  The risk of stroke and other serious problems can be reduced. LET [facility] CARE PROVIDER KNOW ABOUT:   Any allergies you have.  All medicines you are taking, including vitamins, herbs, eye drops, creams, and over-the-counter medicines.  Previous problems you or members of your family have had with the use of anesthetics.   Any blood disorders you have.   Previous surgeries you have had.   Medical conditions you have. RISKS AND  COMPLICATIONS  Generally, this is a safe procedure. However, problems can occur and include:   Breathing problems related to the anesthetic used.  A blood clot that breaks free and travels to other parts of your body. This could cause a stroke or other problems. The risk of this is lowered by use of blood-thinning medicine (anticoagulant) prior to the procedure.  Cardiac arrest (rare). BEFORE THE PROCEDURE   You may have tests to detect blood clots in your heart and to evaluate heart function.  You may start taking anticoagulants so your blood does not clot as easily.   Medicines may be given to help stabilize your heart rate and rhythm. PROCEDURE  You will be given medicine through an IV tube to reduce discomfort and make you sleepy (sedative).   An electrical shock will be delivered. AFTER THE PROCEDURE Your heart rhythm will be watched to make sure it does not change.    This information is not intended to replace advice given to you by your health care provider. Make sure you discuss any questions you have with your health care provider.   Document Released: 01/10/2002 Document Revised: 02/10/2014 Document Reviewed: 08/04/2012 Elsevier Interactive Patient Education [employer].

## 2014-11-10 NOTE — ED Notes (Addendum)
He c/o racing heart since last night, he feels like its his Afib. He reports mild pain in his chest. Denies SOB. He tried to cough and bear down to relieve the fast heart rate with no relief. A&Ox4.

## 2014-11-15 ENCOUNTER — Telehealth: Payer: Self-pay

## 2014-11-15 ENCOUNTER — Other Ambulatory Visit: Payer: Self-pay | Admitting: Gastroenterology

## 2014-11-15 MED ORDER — WARFARIN SODIUM 5 MG PO TABS: ORAL_TABLET | ORAL | Status: DC

## 2014-11-15 NOTE — Telephone Encounter (Signed)
Rx sent for 90 days x 1 refill as per policy.

## 2014-11-15 NOTE — Telephone Encounter (Signed)
Pt is calling for a refill for husband, Warfarin 5 mg for 90 days for 1 year.

## 2014-11-16 ENCOUNTER — Ambulatory Visit (INDEPENDENT_AMBULATORY_CARE_PROVIDER_SITE_OTHER): Payer: Medicare Other | Admitting: Internal Medicine

## 2014-11-16 ENCOUNTER — Encounter: Payer: Self-pay | Admitting: Internal Medicine

## 2014-11-16 VITALS — BP 140/80 | HR 62 | Ht 72.0 in | Wt 176.4 lb

## 2014-11-16 DIAGNOSIS — I4891 Unspecified atrial fibrillation: Secondary | ICD-10-CM

## 2014-11-16 DIAGNOSIS — I10 Essential (primary) hypertension: Secondary | ICD-10-CM | POA: Diagnosis not present

## 2014-11-16 DIAGNOSIS — R001 Bradycardia, unspecified: Secondary | ICD-10-CM

## 2014-11-16 DIAGNOSIS — I4892 Unspecified atrial flutter: Secondary | ICD-10-CM

## 2014-11-16 DIAGNOSIS — I484 Atypical atrial flutter: Secondary | ICD-10-CM | POA: Diagnosis not present

## 2014-11-16 NOTE — Patient Instructions (Signed)
Medication Instructions: - no changes  Labwork: - none  Procedures/Testing: - none  Follow-Up: - Your physician wants you to follow-up in: 1 year with Dr. Klein. You will receive a reminder letter in the mail two months in advance. If you don't receive a letter, please call our office to schedule the follow-up appointment.  Any Additional Special Instructions Will Be Listed Below (If Applicable). - none  

## 2014-11-16 NOTE — Progress Notes (Signed)
Patient Care Team: Biagio Borg, MD as PCP - General   HPI  Jonathan Orozco is a 81 y.o. male Seen in followup for outflow tract ectopy and atrial flutters for which  he underwent EP testing and 3-D mapping which failed to produce stable arrhythmias; medical therapy was undertaken  He is been managed with rate control and anticoagulation with warfarin. Previously he had been on NOACs but was intolerant of Pradaxa because of bloating; he is now on warfarin.    He denies chest pain or shortness of breath. He's had no peripheral edema. H  10/16 he ended up with recurrent tachypalpitations and a [place] was found to be in atypical atrial flutter and was cardioverted  ER records labs and ECG were reviewed   He had noted bradycardia at home with heart rates in the 40s. Hewe will  increased his metoprolol from 100--50    Past Medical History  Diagnosis Date  . MELANOMA, MALIGNANT, SKIN NOS 09/29/2006  . HYPERLIPIDEMIA 09/29/2006  . ERECTILE DYSFUNCTION 09/29/2006  . 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 ABUSE, ALCOHOL, IN REMISSION 04/08/2007  . Other specified forms of hearing loss 08/10/2009  . HYPERTENSION 09/29/2006  . VENTRICULAR TACHYCARDIA 09/29/2006  . SINUSITIS- ACUTE-NOS 06/23/2007  . ALLERGIC RHINITIS 09/29/2006  . BENIGN PROSTATIC HYPERTROPHY 09/29/2006  . OSTEOARTHROSIS NOS, OTHER [facility] SITE 09/29/2006  . BACK PAIN 09/12/2008  . LEG PAIN, RIGHT 10/02/2008  . VERTIGO 04/08/2007  . COLONIC POLYPS, HX OF 06/23/2007  . CHEST PAIN 07/04/2008  . FATIGUE 11/16/2009  . VIRAL GASTROENTERITIS 01/29/2010  . GLUCOSE INTOLERANCE 03/19/2010  . Atrial flutter ([place]) 03/28/2010    a. s/p prior rfca;  b. recurrent paroxysmal flutter 04/2012;  c. pradaxa initiated 04/2012.  [name] Long term (current) use of anticoagulants 04/26/2010  . Dysrhythmia, cardiac 2012    atrial flutter  . Diverticulosis     Past Surgical History  Procedure Laterality Date  . Left rotato cuff    . Joint replacement     Right knee  . Tonsillectomy    . Left knee arthroscopy      Current Outpatient Prescriptions  Medication Sig Dispense Refill  . atorvastatin (LIPITOR) 10 MG tablet Take 1 tablet (10 mg total) by mouth daily. 90 tablet 3  . diltiazem (MATZIM LA) 360 MG 24 hr tablet Take 1 tablet (360 mg total) by mouth daily. 90 tablet 3  . fish oil-omega-3 fatty acids 1000 MG capsule Take 1 g by mouth daily.     [name] HYDROcodone-acetaminophen (NORCO/VICODIN) 5-325 MG per tablet Take 1 tablet by mouth daily as needed. 90 tablet 0  . LORazepam (ATIVAN) 0.5 MG tablet Take 0.5 mg by mouth every 6 (six) hours as needed for anxiety.    . metoprolol succinate (TOPROL-XL) 100 MG 24 hr tablet Take 1 tablet (100 mg total) by mouth daily. Take with or immediately following a meal. (Patient taking differently: Take 50 mg by mouth daily. Take with or immediately following a meal.) 90 tablet 2  . Multiple Vitamin (MULTIVITAMIN) tablet Take 1 tablet by mouth daily.      . Probiotic Product (RESTORA) CAPS Take 1 capsule by mouth daily. 10 capsule 0  . psyllium (METAMUCIL) 58.6 % packet Take 1 packet by mouth daily.    [name] warfarin (COUMADIN) 5 MG tablet Take 1-1 1/2 tablets daily as directed by anticoagulation clinic 135 tablet 1   No current facility-administered medications for this visit.    Allergies  Allergen Reactions  .  Ace Inhibitors     REACTION: cough  . Amiodarone Hcl     REACTION: intolerance    Review of Systems negative except from HPI and PMH  Physical Exam BP 140/80 mmHg  Pulse 62  Ht 6' (1.829 m)  Wt 176 lb 6.4 oz (80.015 kg)  BMI 23.92 kg/m2 Well developed and nourished in no acute distress HENT normal Neck supple with JVP-flat Clear Regular rate and rhythm, no murmurs or gallops Abd-soft with active BS No Clubbing cyanosis edema Skin-warm and dry A & Oriented  Grossly normal sensory and motor function   ECG demonstrates sinus rhythm  at 62 Intervals 20/14/45 Axis XLVI    Assessment  and  Plan  Atrial fibrillation/flutter paroxysmal   Sinus bradycardia  Hypertension   he underwent cardioversion in the ER the other day. We discussed the likelihood of recurrent atrial arrhythmias; in the event that it remains infrequent, I would avoid the use of antiarrhythmic drugs. And have suggested that recurrences infrequent cardioversion is our best strategy. He takes a fair amount of rate controlling drugs at present he has a history of sinus bradycardia so will not up titrate these.  Blood pressure is reasonably controlled.

## 2014-11-17 ENCOUNTER — Telehealth: Payer: Self-pay | Admitting: Internal Medicine

## 2014-11-17 MED ORDER — WARFARIN SODIUM 5 MG PO TABS: ORAL_TABLET | ORAL | Status: DC

## 2014-11-17 NOTE — Telephone Encounter (Signed)
°  STAT if patient is at the pharmacy , call can be transferred to refill team.   1. Which medications need to be refilled? Warfarin 5 Mg   2. Which pharmacy/location is medication to be sent to? [facility].   3. Do they need a 30 day or 90 day supply? 90 day refillable for 1 year  Comments: Please let her know that it was sent

## 2014-11-17 NOTE — Telephone Encounter (Signed)
Rx sent.  Pt's wife aware.

## 2014-11-21 ENCOUNTER — Telehealth: Payer: Self-pay | Admitting: *Deleted

## 2014-11-21 ENCOUNTER — Ambulatory Visit (INDEPENDENT_AMBULATORY_CARE_PROVIDER_SITE_OTHER): Payer: Medicare Other | Admitting: Pharmacist

## 2014-11-21 DIAGNOSIS — Z5181 Encounter for therapeutic drug level monitoring: Secondary | ICD-10-CM | POA: Diagnosis not present

## 2014-11-21 DIAGNOSIS — I4892 Unspecified atrial flutter: Secondary | ICD-10-CM

## 2014-11-21 DIAGNOSIS — I484 Atypical atrial flutter: Secondary | ICD-10-CM | POA: Diagnosis not present

## 2014-11-21 LAB — POCT INR: INR: 2.8

## 2014-11-21 NOTE — Telephone Encounter (Signed)
Although the medication is on the med list (ativan) I dont see where I have prescribed this recently or even in the past 5 yrs that [facility] has been going  Perhaps he has been prescribed by another  MD?    Also we try not to send ativan to [facility] pharmacies due to the risk of dependence of taking these medications and the large quantities that would involve 90 day prescriptions

## 2014-11-21 NOTE — Telephone Encounter (Signed)
Left msg on triage stating needing to get refill on husband Lorazepam sent to [facility]. Also want to ask md pt is having colonoscopy soon has already been made aware to adjust his coumadin. Does he need to adjust other medications prior to procedure...Jonathan Orozco

## 2014-11-22 MED ORDER — ALPRAZOLAM 0.5 MG PO TABS: 0.5000 mg | ORAL_TABLET | Freq: Every day | ORAL | Status: DC | PRN

## 2014-11-22 NOTE — Telephone Encounter (Signed)
Notified pt wife with md response fax to pharmacy...Jonathan Orozco

## 2014-11-22 NOTE — Telephone Encounter (Signed)
I cant do the xanax mail order for the reasons below, but I did a new rx.   - Done hardcopy to stacy

## 2014-11-22 NOTE — Telephone Encounter (Signed)
Call wife no answer LMOM RTC...Johny Chess

## 2014-11-22 NOTE — Telephone Encounter (Signed)
Wife return call back she stated that Dr. Jenny Reichmann rx med back on 04/27/12. He doesn't take often. She states they can get med cheaper through mail service...Jonathan Orozco

## 2014-11-24 ENCOUNTER — Ambulatory Visit: Payer: Medicare Other

## 2014-12-01 ENCOUNTER — Encounter (HOSPITAL_COMMUNITY): Payer: Self-pay | Admitting: *Deleted

## 2014-12-06 NOTE — H&P (Signed)
  Courtney Paris HPI: On routine testing the patient was identified to have heme positive stool. He states having issues with being "aware" of his colon. Seven years ago he underwent a colonoscopy with Dr. Henrene Pastor with findings of a polyp as well as diverticula. No complaints of hematochezia or melena. There is no known family history of colon cancer. No complaints of chest pain, SOB, or MI. He has a history of aflutter and it is chronic.  Past Medical History  Diagnosis Date  . HYPERLIPIDEMIA 09/29/2006  . ERECTILE DYSFUNCTION 09/29/2006  . 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 ABUSE, ALCOHOL, IN REMISSION 04/08/2007  . Other specified forms of hearing loss 08/10/2009  . HYPERTENSION 09/29/2006  . VENTRICULAR TACHYCARDIA 09/29/2006  . SINUSITIS- ACUTE-NOS 06/23/2007  . ALLERGIC RHINITIS 09/29/2006  . BENIGN PROSTATIC HYPERTROPHY 09/29/2006  . OSTEOARTHROSIS NOS, OTHER [facility] SITE 09/29/2006  . BACK PAIN 09/12/2008  . LEG PAIN, RIGHT 10/02/2008  . VERTIGO 04/08/2007  . COLONIC POLYPS, HX OF 06/23/2007  . CHEST PAIN 07/04/2008  . FATIGUE 11/16/2009  . VIRAL GASTROENTERITIS 01/29/2010  . GLUCOSE INTOLERANCE 03/19/2010  . Atrial flutter ([place]) 03/28/2010    a. s/p prior rfca;  b. recurrent paroxysmal flutter 04/2012;  c. pradaxa initiated 04/2012.  [name] Long term (current) use of anticoagulants 04/26/2010  . Dysrhythmia, cardiac 2012    atrial flutter  . Diverticulosis   . MELANOMA, MALIGNANT, SKIN NOS 09/29/2006    other skin cancers -no further melanoma  . Hearing loss     hard of hearing    Past Surgical History  Procedure Laterality Date  . Left rotato cuff    . Joint replacement      Right knee  . Tonsillectomy    . Left knee arthroscopy    . Cataract extraction, bilateral Bilateral     Family History  Problem Relation Age of Onset  . Diabetes Brother   . Esophageal cancer Father   . Colon cancer Neg Hx   . Stomach cancer Neg Hx     Social History:  reports that he has quit smoking. He  has never used smokeless tobacco. He reports that he does not drink alcohol or use illicit drugs.  Allergies:  Allergies  Allergen Reactions  . Ace Inhibitors     REACTION: cough  . Amiodarone Hcl     REACTION: intolerance    Medications: Scheduled: Continuous:  No results found for this or any previous visit (from the past 24 hour(s)).   No results found.  ROS:  As stated above in the HPI otherwise negative.  There were no vitals taken for this visit.    PE: Gen: NAD, Alert and Oriented HEENT:  [place]/AT, EOMI Neck: Supple, no LAD Lungs: CTA Bilaterally CV: RRR without M/G/R ABM: Soft, NTND, +BS Ext: No C/C/E  Assessment/Plan: 1) Heme positive stool - Colonoscopy.  Roselin Wiemann D 12/06/2014, 7:30 PM

## 2014-12-08 ENCOUNTER — Encounter (HOSPITAL_COMMUNITY)
Admission: RE | Disposition: A | Discharge status: Home / Self Care | Payer: Self-pay | Source: Ambulatory Visit | Attending: Gastroenterology

## 2014-12-08 ENCOUNTER — Encounter (HOSPITAL_COMMUNITY): Payer: Self-pay | Admitting: *Deleted

## 2014-12-08 ENCOUNTER — Ambulatory Visit (HOSPITAL_COMMUNITY)
Admission: RE | Admit: 2014-12-08 | Discharge: 2014-12-08 | Disposition: A | Discharge status: Home / Self Care | Payer: Medicare Other | Source: Ambulatory Visit | Attending: Gastroenterology | Admitting: Gastroenterology

## 2014-12-08 ENCOUNTER — Ambulatory Visit (HOSPITAL_COMMUNITY): Payer: Medicare Other | Admitting: Anesthesiology

## 2014-12-08 DIAGNOSIS — Z85820 Personal history of malignant melanoma of skin: Secondary | ICD-10-CM | POA: Diagnosis not present

## 2014-12-08 DIAGNOSIS — K5790 Diverticulosis of intestine, part unspecified, without perforation or abscess without bleeding: Secondary | ICD-10-CM | POA: Diagnosis not present

## 2014-12-08 DIAGNOSIS — Z86010 Personal history of colonic polyps: Secondary | ICD-10-CM | POA: Diagnosis not present

## 2014-12-08 DIAGNOSIS — Z79899 Other long term (current) drug therapy: Secondary | ICD-10-CM | POA: Diagnosis not present

## 2014-12-08 DIAGNOSIS — Z87891 Personal history of nicotine dependence: Secondary | ICD-10-CM | POA: Diagnosis not present

## 2014-12-08 DIAGNOSIS — M1990 Unspecified osteoarthritis, unspecified site: Secondary | ICD-10-CM | POA: Diagnosis not present

## 2014-12-08 DIAGNOSIS — N400 Enlarged prostate without lower urinary tract symptoms: Secondary | ICD-10-CM | POA: Insufficient documentation

## 2014-12-08 DIAGNOSIS — F419 Anxiety disorder, unspecified: Secondary | ICD-10-CM | POA: Diagnosis not present

## 2014-12-08 DIAGNOSIS — Z7901 Long term (current) use of anticoagulants: Secondary | ICD-10-CM | POA: Insufficient documentation

## 2014-12-08 DIAGNOSIS — D175 Benign lipomatous neoplasm of intra-abdominal organs: Secondary | ICD-10-CM | POA: Insufficient documentation

## 2014-12-08 DIAGNOSIS — I10 Essential (primary) hypertension: Secondary | ICD-10-CM | POA: Insufficient documentation

## 2014-12-08 DIAGNOSIS — E785 Hyperlipidemia, unspecified: Secondary | ICD-10-CM | POA: Diagnosis not present

## 2014-12-08 DIAGNOSIS — Z96651 Presence of right artificial knee joint: Secondary | ICD-10-CM | POA: Insufficient documentation

## 2014-12-08 DIAGNOSIS — N529 Male erectile dysfunction, unspecified: Secondary | ICD-10-CM | POA: Diagnosis not present

## 2014-12-08 DIAGNOSIS — D123 Benign neoplasm of transverse colon: Secondary | ICD-10-CM | POA: Diagnosis not present

## 2014-12-08 DIAGNOSIS — R195 Other fecal abnormalities: Secondary | ICD-10-CM | POA: Diagnosis not present

## 2014-12-08 DIAGNOSIS — I472 Ventricular tachycardia: Secondary | ICD-10-CM | POA: Diagnosis not present

## 2014-12-08 HISTORY — PX: COLONOSCOPY WITH PROPOFOL: SHX5780

## 2014-12-08 SURGERY — COLONOSCOPY WITH PROPOFOL
Anesthesia: Monitor Anesthesia Care

## 2014-12-08 MED ORDER — PROPOFOL 10 MG/ML IV BOLUS
INTRAVENOUS | Status: AC
Start: 2014-12-08 — End: 2014-12-08
  Filled 2014-12-08: qty 20

## 2014-12-08 MED ORDER — PROPOFOL 500 MG/50ML IV EMUL
INTRAVENOUS | Status: DC | PRN
  Administered 2014-12-08: 100 ug/kg/min via INTRAVENOUS

## 2014-12-08 MED ORDER — PROPOFOL 10 MG/ML IV BOLUS
INTRAVENOUS | Status: DC | PRN
  Administered 2014-12-08: 20 mg via INTRAVENOUS
  Administered 2014-12-08: 30 mg via INTRAVENOUS

## 2014-12-08 MED ORDER — LACTATED RINGERS IV SOLN
INTRAVENOUS | Status: DC | PRN
  Administered 2014-12-08: 10:00:00 via INTRAVENOUS

## 2014-12-08 MED ORDER — PROPOFOL 10 MG/ML IV BOLUS
INTRAVENOUS | Status: AC
  Filled 2014-12-08: qty 20

## 2014-12-08 MED ORDER — LACTATED RINGERS IV SOLN
Freq: Once | INTRAVENOUS | Status: AC
  Administered 2014-12-08: 1000 mL via INTRAVENOUS

## 2014-12-08 MED ORDER — SODIUM CHLORIDE 0.9 % IV SOLN: INTRAVENOUS | Status: DC

## 2014-12-08 SURGICAL SUPPLY — 21 items

## 2014-12-08 NOTE — Op Note (Signed)
[facility] [place], [address]   COLONOSCOPY PROCEDURE REPORT  PATIENT: Jonathan Orozco, Jonathan Orozco  MR#: [id number] BIRTHDATE: 12-15-1928 , 86  yrs. old GENDER: male ENDOSCOPIST: [name], MD REFERRED BY: PROCEDURE DATE:  12-21-2014 PROCEDURE:   Colonoscopy with snare polypectomy ASA CLASS:   Class III INDICATIONS: Heme positive stool MEDICATIONS: Monitored anesthesia care  DESCRIPTION OF PROCEDURE:   After the risks and benefits and of the procedure were explained, informed consent was obtained.  revealed no abnormalities of the rectum.    The [id number] ([id number]) endoscope was introduced through the anus and advanced to the cecum, which was identified by both the appendix and ileocecal valve .  The quality of the prep was excellent. .  The instrument was then slowly withdrawn as the colon was fully examined. Estimated blood loss is zero unless otherwise noted in this procedure report.   FINDINGS: In the transverse colon two polyps were identifed.  One measured 4 mm and the other was 2-3 mm.  Both polyps were removed with a cold snare.  In the sigmoid colon scattered diverticula were found.  In the rectosigmoid region a moderately-sized lipoma was found.     Retroflexed views revealed no abnormalities.     The scope was then withdrawn from the patient and the procedure completed.  WITHDRAWAL TIME: 17 minutes 0 seconds  COMPLICATIONS: There were no immediate complications. ENDOSCOPIC IMPRESSION: 1) Polyp(s) were found. 2) Diverticula. 3) Lipoma.  RECOMMENDATIONS: 1) Follow up biopsies. 2) Restart coumadin. 3) Given his age, comorbidities, and the current findings, no repeat colonoscopy is recommended at this time.  REPEAT EXAM:  cc:  _______________________________ eSignedCarol Ada, MD 12-21-2014 11:20 AM   CPT CODES: ICD CODES:  The ICD and CPT codes recommended by this software are interpretations from the data that the  clinical staff has captured with the software.  The verification of the translation of this report to the ICD and CPT codes and modifiers is the sole responsibility of the health care institution and practicing physician where this report was generated.  [place]. will not be held responsible for the validity of the ICD and CPT codes included on this report.  AMA assumes no liability for data contained or not contained herein. CPT is a Designer, television/film set of the [employer].   PATIENT NAME:  [name], [name] MR#: [id number]

## 2014-12-08 NOTE — Interval H&P Note (Signed)
History and Physical Interval Note:  12/08/2014 10:11 AM  Jonathan Orozco  has presented today for surgery, with the diagnosis of diarrhea  The various methods of treatment have been discussed with the patient and family. After consideration of risks, benefits and other options for treatment, the patient has consented to  Procedure(s): COLONOSCOPY WITH PROPOFOL (N/A) as a surgical intervention .  The patient's history has been reviewed, patient examined, no change in status, stable for surgery.  I have reviewed the patient's chart and labs.  Questions were answered to the patient's satisfaction.     Ibn Stief D

## 2014-12-08 NOTE — Anesthesia Preprocedure Evaluation (Addendum)
Anesthesia Evaluation  Patient identified by MRN, date of birth, ID band Patient awake    Reviewed: Allergy & Precautions, NPO status , Patient's Chart, lab work & pertinent test results, reviewed documented beta blocker date and time   Airway Mallampati: I  TM Distance: >3 FB Neck ROM: Full    Dental  (+) Edentulous Upper, Edentulous Lower   Pulmonary former smoker,    breath sounds clear to auscultation       Cardiovascular hypertension, Pt. on medications and Pt. on home beta blockers + dysrhythmias Ventricular Tachycardia  Rhythm:Regular Rate:Normal     Neuro/Psych PSYCHIATRIC DISORDERS Anxiety    GI/Hepatic negative GI ROS, Neg liver ROS,   Endo/Other  negative endocrine ROS  Renal/GU negative Renal ROS  negative genitourinary   Musculoskeletal  (+) Arthritis ,   Abdominal   Peds negative pediatric ROS (+)  Hematology negative hematology ROS (+)   Anesthesia Other Findings   Reproductive/Obstetrics negative OB ROS                           Lab Results  Component Value Date   WBC 7.0 11/10/2014   HGB 16.0 11/10/2014   HCT 47.1 11/10/2014   MCV 93.1 11/10/2014   PLT 253 11/10/2014   Lab Results  Component Value Date   CREATININE 0.87 11/10/2014   BUN 10 11/10/2014   NA 135 11/10/2014   K 4.2 11/10/2014   CL 100* 11/10/2014   CO2 27 11/10/2014   Lab Results  Component Value Date   INR 2.8 11/21/2014   INR 2.02* 11/10/2014   INR 2.4 10/10/2014   PROTIME 18.8 07/10/2008   EKG: normal sinus rhythm.   Anesthesia Physical Anesthesia Plan  ASA: III  Anesthesia Plan: MAC   Post-op Pain Management:    Induction: Intravenous  Airway Management Planned: Natural Airway and Nasal Cannula  Additional Equipment:   Intra-op Plan:   Post-operative Plan:   Informed Consent: I have reviewed the patients History and Physical, chart, labs and discussed the procedure  including the risks, benefits and alternatives for the proposed anesthesia with the patient or authorized representative who has indicated his/her understanding and acceptance.   Dental advisory given  Plan Discussed with: CRNA  Anesthesia Plan Comments:        Anesthesia Quick Evaluation

## 2014-12-08 NOTE — Transfer of Care (Signed)
Immediate Anesthesia Transfer of Care Note  Patient: Jonathan Orozco  Procedure(s) Performed: Procedure(s): COLONOSCOPY WITH PROPOFOL (N/A)  Patient Location: PACU and Endoscopy Unit  Anesthesia Type:MAC  Level of Consciousness: sedated and patient cooperative  Airway & Oxygen Therapy: Patient Spontanous Breathing and Patient connected to face mask oxygen  Post-op Assessment: Report given to RN and Post -op Vital signs reviewed and stable  Post vital signs: Reviewed and stable  Last Vitals:  Filed Vitals:   12/08/14 1009  BP: 142/66  Temp: 36.9 C  Resp: 11    Complications: No apparent anesthesia complications

## 2014-12-08 NOTE — Discharge Instructions (Signed)
Colonoscopy, Care After °Refer to this sheet in the next few weeks. These instructions provide you with information on caring for yourself after your procedure. Your health care provider may also give you more specific instructions. Your treatment has been planned according to current medical practices, but problems sometimes occur. Call your health care provider if you have any problems or questions after your procedure. °WHAT TO EXPECT AFTER THE PROCEDURE  °After your procedure, it is typical to have the following: °· A small amount of blood in your stool. °· Moderate amounts of gas and mild abdominal cramping or bloating. °HOME CARE INSTRUCTIONS °· Do not drive, operate machinery, or sign important documents for 24 hours. °· You may shower and resume your regular physical activities, but move at a slower pace for the first 24 hours. °· Take frequent rest periods for the first 24 hours. °· Walk around or put a warm pack on your abdomen to help reduce abdominal cramping and bloating. °· Drink enough fluids to keep your urine clear or pale yellow. °· You may resume your normal diet as instructed by your health care provider. Avoid heavy or fried foods that are hard to digest. °· Avoid drinking alcohol for 24 hours or as instructed by your health care provider. °· Only take over-the-counter or prescription medicines as directed by your health care provider. °· If a tissue sample (biopsy) was taken during your procedure: °¨ Do not take aspirin or blood thinners for 7 days, or as instructed by your health care provider. °¨ Do not drink alcohol for 7 days, or as instructed by your health care provider. °¨ Eat soft foods for the first 24 hours. °SEEK MEDICAL CARE IF: °You have persistent spotting of blood in your stool 2-3 days after the procedure. °SEEK IMMEDIATE MEDICAL CARE IF: °· You have more than a small spotting of blood in your stool. °· You pass large blood clots in your stool. °· Your abdomen is swollen  (distended). °· You have nausea or vomiting. °· You have a fever. °· You have increasing abdominal pain that is not relieved with medicine. °  °This information is not intended to replace advice given to you by your health care provider. Make sure you discuss any questions you have with your health care provider. °  °Document Released: 09/04/2003 Document Revised: 11/10/2012 Document Reviewed: 09/27/2012 °Elsevier Interactive Patient Education ©2016 [employer] Inc[employer] ° °

## 2014-12-08 NOTE — Anesthesia Postprocedure Evaluation (Signed)
  Anesthesia Post-op Note  Patient: Jonathan Orozco  Procedure(s) Performed: Procedure(s): COLONOSCOPY WITH PROPOFOL (N/A)  Patient Location: Endoscopy Unit  Anesthesia Type:MAC  Level of Consciousness: awake, alert  and oriented  Airway and Oxygen Therapy: Patient Spontanous Breathing  Post-op Pain: none  Post-op Assessment: Post-op Vital signs reviewed              Post-op Vital Signs: Reviewed  Last Vitals:  Filed Vitals:   12/08/14 1120  BP: 104/65  Pulse: 64  Temp:   Resp: 15    Complications: No apparent anesthesia complications

## 2014-12-11 ENCOUNTER — Encounter (HOSPITAL_COMMUNITY): Payer: Self-pay | Admitting: Gastroenterology

## 2014-12-11 ENCOUNTER — Telehealth: Payer: Self-pay | Admitting: Internal Medicine

## 2014-12-11 ENCOUNTER — Ambulatory Visit: Payer: Medicare Other | Admitting: Internal Medicine

## 2014-12-11 NOTE — Telephone Encounter (Signed)
Please advise, thanks.

## 2014-12-11 NOTE — Telephone Encounter (Signed)
Pt wife called in and said that need pt need letter sent to [facility] stating that he needs further testing on his ears  Fax number is [place]

## 2014-12-12 NOTE — Telephone Encounter (Signed)
[place] for dahlia to clarify, as this sounds a bit too simple to actually be made a letter

## 2014-12-18 ENCOUNTER — Telehealth: Payer: Self-pay | Admitting: Internal Medicine

## 2014-12-18 NOTE — Telephone Encounter (Signed)
Patients wife called in stating My Chart is not reflecting flu vac.  I also looked in chart and do not see it.  Patient came in on 10/21 for vac.  Can you please look into this and update?

## 2014-12-19 ENCOUNTER — Ambulatory Visit (INDEPENDENT_AMBULATORY_CARE_PROVIDER_SITE_OTHER): Payer: Medicare Other | Admitting: *Deleted

## 2014-12-19 DIAGNOSIS — Z5181 Encounter for therapeutic drug level monitoring: Secondary | ICD-10-CM | POA: Diagnosis not present

## 2014-12-19 DIAGNOSIS — I484 Atypical atrial flutter: Secondary | ICD-10-CM

## 2014-12-19 DIAGNOSIS — I4892 Unspecified atrial flutter: Secondary | ICD-10-CM

## 2014-12-19 LAB — POCT INR: INR: 2.7

## 2014-12-21 ENCOUNTER — Telehealth: Payer: Self-pay

## 2014-12-21 DIAGNOSIS — H9193 Unspecified hearing loss, bilateral: Secondary | ICD-10-CM

## 2014-12-21 NOTE — Telephone Encounter (Signed)
Pt advised to contact Audiology office to get specific information regarding letter and call back

## 2014-12-21 NOTE — Telephone Encounter (Signed)
Pt is requesting a referral to [facility] for Audiologic testing

## 2014-12-21 NOTE — Telephone Encounter (Signed)
referal done 

## 2014-12-26 NOTE — Telephone Encounter (Signed)
Referral faxed to [facility]. Left msg on patient's vm to make him aware.

## 2014-12-26 NOTE — Telephone Encounter (Signed)
pts wife is calling back regarding this referral. I know it was just put in but she's been trying to get the referral for a while now.

## 2015-01-17 ENCOUNTER — Telehealth: Payer: Self-pay | Admitting: Internal Medicine

## 2015-01-17 NOTE — Telephone Encounter (Signed)
I called and spoke with the patient. He states that for the last 2-3 months, when he goes to the park to exercise he develops a mid sternal burning sensation in his chest.  He also reports that he has "labored breathing" and that it "hurts" when he breathes. He is asymptomatic at rest and around his house as he is not exerting himself as much as when he walks at the park. This does not occur every day. He also reports that he does not feel like his HR goes up as it should with exertion.  He is currently on metoprolol. I advised him I am not certain as to what his symptoms are coming from, or if this is cardiac in nature. I offered to make the patient an appointment with one of our PA/ NP's for further evaluation. He states he would like to see Dr. Jenny Reichmann. I advised him this is fine and that we could see him after this if Dr. Jenny Reichmann feels this is warranted.

## 2015-01-17 NOTE — Telephone Encounter (Signed)
New Message   Pt states he get a burning pain in his chest and it goes away after a while but it make him stop walking   Pt c/o of Chest Pain: STAT if CP now or developed within 24 hours  1. Are you having CP right now? No  2. Are you experiencing any other symptoms (ex. SOB, nausea, vomiting, sweating)? Sob  3. How long have you been experiencing CP? 2 or 3 months  4. Is your CP continuous or coming and going? both  5. Have you taken Nitroglycerin? no?

## 2015-01-19 ENCOUNTER — Telehealth: Payer: Self-pay | Admitting: Internal Medicine

## 2015-01-19 NOTE — Telephone Encounter (Signed)
Refill is not due until 12/19. Pt can discuss with Dr Linna Darner at [facility] since PCP is out of the office

## 2015-01-19 NOTE — Telephone Encounter (Signed)
Pt requesting refill for HYDROcodone-acetaminophen (NORCO/VICODIN) 5-325 MG per tablet [id number]  He has an appointment on Monday with [facility] and says he can pick it up then.

## 2015-01-22 ENCOUNTER — Encounter: Payer: Self-pay | Admitting: Internal Medicine

## 2015-01-22 ENCOUNTER — Ambulatory Visit (INDEPENDENT_AMBULATORY_CARE_PROVIDER_SITE_OTHER): Payer: Medicare Other | Admitting: Internal Medicine

## 2015-01-22 VITALS — BP 120/86 | HR 68 | Temp 98.8°F | Resp 20 | Ht 74.0 in | Wt 182.8 lb

## 2015-01-22 DIAGNOSIS — R0609 Other forms of dyspnea: Secondary | ICD-10-CM

## 2015-01-22 DIAGNOSIS — R9389 Abnormal findings on diagnostic imaging of other specified body structures: Secondary | ICD-10-CM

## 2015-01-22 DIAGNOSIS — R938 Abnormal findings on diagnostic imaging of other specified body structures: Secondary | ICD-10-CM

## 2015-01-22 NOTE — Progress Notes (Signed)
   Subjective:    Patient ID: Jonathan Orozco, male    DOB: 1928/03/04, 81 y.o.   MRN: [id number]  HPI  Over the last 2-3 months he's noted exertional dyspnea after 1-2 blocks. He will stop and rest but can walk for extended distances thereafter at a slower pace.  He smoked at least 17 years up to a pack a day, quitting in the late 1960s.  His brother and sister had lung cancer. His last chest x-ray was 11/10/14. This revealed slightly prominent interstitial markings. CT scan in May of this year revealed stable nodular opacities with underlying emphysematous change and areas of fibrosis. Stable interstitial pneumonitis was suggested.  Review of Systems  Chest pain, palpitations, tachycardia,wheezing, hemoptysis, paroxysmal nocturnal dyspnea, claudication or edema are absent.      Objective:   Physical Exam Pertinent or positive findings include: He has a goatee. Slight clubbing is suggested. He has crepitus of the knees.   General appearance :adequately nourished; in no distress.  Eyes: No conjunctival inflammation or scleral icterus is present.  Heart:  Normal rate and regular rhythm. S1 and S2 normal without gallop, murmur, click, rub or other extra sounds    Lungs:Chest clear to auscultation; no wheezes, rhonchi,rales ,or rubs present.No increased work of breathing.   Abdomen: bowel sounds normal, soft and non-tender without masses, organomegaly or hernias noted.  No guarding or rebound. No hepatojugular reflux  Vascular : all pulses equal ; no bruits present.  Skin:Warm & dry.  Intact without suspicious lesions or rashes ; no tenting or jaundice   Lymphatic: No lymphadenopathy is noted about the head, neck, axilla.   Neuro: Strength, tone normal.     Assessment & Plan:  #1 dyspnea on exertion  #2 radiographic evidence of some emphysema, stable nodular opacities, and interstitial fibrosis  Plan: See orders

## 2015-01-22 NOTE — Patient Instructions (Signed)
The PFT referral will be scheduled and you'll be notified of the time.Please call the Referral Co-Ordinator @ [phone] if you have not been notified of appointment time within 7-10 days.

## 2015-01-22 NOTE — Progress Notes (Signed)
Pre visit review using our clinic review tool, if applicable. No additional management support is needed unless otherwise documented below in the visit note. 

## 2015-01-30 ENCOUNTER — Telehealth: Payer: Self-pay

## 2015-01-30 ENCOUNTER — Ambulatory Visit (INDEPENDENT_AMBULATORY_CARE_PROVIDER_SITE_OTHER): Payer: Medicare Other | Admitting: *Deleted

## 2015-01-30 DIAGNOSIS — I4892 Unspecified atrial flutter: Secondary | ICD-10-CM | POA: Diagnosis not present

## 2015-01-30 DIAGNOSIS — Z5181 Encounter for therapeutic drug level monitoring: Secondary | ICD-10-CM | POA: Diagnosis not present

## 2015-01-30 DIAGNOSIS — I484 Atypical atrial flutter: Secondary | ICD-10-CM | POA: Diagnosis not present

## 2015-01-30 LAB — POCT INR: INR: 4.6

## 2015-01-30 MED ORDER — HYDROCODONE-ACETAMINOPHEN 5-325 MG PO TABS: 1.0000 | ORAL_TABLET | Freq: Every day | ORAL | Status: DC | PRN

## 2015-01-30 NOTE — Telephone Encounter (Signed)
Pt requesting refill of hydrocodone. Last refill was 10/24/14. Requesting 90 day supply. Please advise

## 2015-01-30 NOTE — Telephone Encounter (Signed)
Done hardcopy to Dahlia  

## 2015-01-31 NOTE — Telephone Encounter (Signed)
Pt informed via VM, Rx in cabinet for pt pick up  

## 2015-02-09 ENCOUNTER — Ambulatory Visit (INDEPENDENT_AMBULATORY_CARE_PROVIDER_SITE_OTHER): Payer: Medicare Other | Admitting: *Deleted

## 2015-02-09 DIAGNOSIS — I484 Atypical atrial flutter: Secondary | ICD-10-CM | POA: Diagnosis not present

## 2015-02-09 DIAGNOSIS — I4892 Unspecified atrial flutter: Secondary | ICD-10-CM

## 2015-02-09 DIAGNOSIS — Z5181 Encounter for therapeutic drug level monitoring: Secondary | ICD-10-CM | POA: Diagnosis not present

## 2015-02-09 LAB — POCT INR: INR: 2.6

## 2015-02-28 ENCOUNTER — Telehealth: Payer: Self-pay | Admitting: Internal Medicine

## 2015-02-28 NOTE — Telephone Encounter (Signed)
Will need to forward this message to Dr [name] and covering nurse for further review and recommendation on lab appt and orders needed same day as 05/14/15 OV.  Someone will follow-up with the pt or spouse thereafter.

## 2015-02-28 NOTE — Telephone Encounter (Signed)
enwMessage  Pt has appt w/ Dr [name] 05/14/15 and pt wife stated that he usually has labwork paired with it. No order in syst. Please advise

## 2015-03-01 NOTE — Telephone Encounter (Signed)
I left a message for the patient to call. I do not see where he has labs with Dr. [name], but Dr. Jenny Reichmann does order labs for him.

## 2015-03-02 ENCOUNTER — Ambulatory Visit (INDEPENDENT_AMBULATORY_CARE_PROVIDER_SITE_OTHER): Payer: 59

## 2015-03-02 DIAGNOSIS — Z5181 Encounter for therapeutic drug level monitoring: Secondary | ICD-10-CM

## 2015-03-02 DIAGNOSIS — I4892 Unspecified atrial flutter: Secondary | ICD-10-CM

## 2015-03-02 DIAGNOSIS — I484 Atypical atrial flutter: Secondary | ICD-10-CM | POA: Diagnosis not present

## 2015-03-02 LAB — POCT INR: INR: 3.8

## 2015-03-04 ENCOUNTER — Encounter (HOSPITAL_COMMUNITY): Payer: Self-pay | Admitting: *Deleted

## 2015-03-04 ENCOUNTER — Emergency Department (HOSPITAL_COMMUNITY)
Admission: EM | Admit: 2015-03-04 | Discharge: 2015-03-04 | Disposition: A | Discharge status: Home / Self Care | Payer: Medicare Other | Attending: Emergency Medicine | Admitting: Emergency Medicine

## 2015-03-04 ENCOUNTER — Emergency Department (HOSPITAL_COMMUNITY): Payer: Medicare Other

## 2015-03-04 DIAGNOSIS — Z8739 Personal history of other diseases of the musculoskeletal system and connective tissue: Secondary | ICD-10-CM | POA: Insufficient documentation

## 2015-03-04 DIAGNOSIS — I4891 Unspecified atrial fibrillation: Secondary | ICD-10-CM | POA: Diagnosis not present

## 2015-03-04 DIAGNOSIS — R42 Dizziness and giddiness: Secondary | ICD-10-CM | POA: Diagnosis not present

## 2015-03-04 DIAGNOSIS — Z8719 Personal history of other diseases of the digestive system: Secondary | ICD-10-CM | POA: Diagnosis not present

## 2015-03-04 DIAGNOSIS — R002 Palpitations: Secondary | ICD-10-CM | POA: Diagnosis present

## 2015-03-04 DIAGNOSIS — R531 Weakness: Secondary | ICD-10-CM | POA: Insufficient documentation

## 2015-03-04 DIAGNOSIS — Z85820 Personal history of malignant melanoma of skin: Secondary | ICD-10-CM | POA: Insufficient documentation

## 2015-03-04 DIAGNOSIS — Z79899 Other long term (current) drug therapy: Secondary | ICD-10-CM | POA: Insufficient documentation

## 2015-03-04 DIAGNOSIS — Z7901 Long term (current) use of anticoagulants: Secondary | ICD-10-CM | POA: Diagnosis not present

## 2015-03-04 DIAGNOSIS — Z8709 Personal history of other diseases of the respiratory system: Secondary | ICD-10-CM | POA: Insufficient documentation

## 2015-03-04 DIAGNOSIS — E785 Hyperlipidemia, unspecified: Secondary | ICD-10-CM | POA: Diagnosis not present

## 2015-03-04 DIAGNOSIS — Z8669 Personal history of other diseases of the nervous system and sense organs: Secondary | ICD-10-CM | POA: Insufficient documentation

## 2015-03-04 DIAGNOSIS — Z87438 Personal history of other diseases of male genital organs: Secondary | ICD-10-CM | POA: Insufficient documentation

## 2015-03-04 DIAGNOSIS — Z86010 Personal history of colonic polyps: Secondary | ICD-10-CM | POA: Insufficient documentation

## 2015-03-04 DIAGNOSIS — I10 Essential (primary) hypertension: Secondary | ICD-10-CM | POA: Diagnosis not present

## 2015-03-04 LAB — CBC
HEMATOCRIT: 47.8 % (ref 39.0–52.0)
HEMOGLOBIN: 16.2 g/dL (ref 13.0–17.0)
MCH: 32.1 pg (ref 26.0–34.0)
MCHC: 33.9 g/dL (ref 30.0–36.0)
MCV: 94.7 fL (ref 78.0–100.0)
Platelets: 234 10*3/uL (ref 150–400)
RBC: 5.05 MIL/uL (ref 4.22–5.81)
RDW: 12.9 % (ref 11.5–15.5)
WBC: 8.1 10*3/uL (ref 4.0–10.5)

## 2015-03-04 LAB — PROTIME-INR
INR: 2.43 — AB (ref 0.00–1.49)
Prothrombin Time: 26.2 seconds — ABNORMAL HIGH (ref 11.6–15.2)

## 2015-03-04 LAB — I-STAT TROPONIN, ED: TROPONIN I, POC: 0 ng/mL (ref 0.00–0.08)

## 2015-03-04 LAB — BASIC METABOLIC PANEL
ANION GAP: 11 (ref 5–15)
BUN: 11 mg/dL (ref 6–20)
CHLORIDE: 98 mmol/L — AB (ref 101–111)
CO2: 27 mmol/L (ref 22–32)
Calcium: 9.3 mg/dL (ref 8.9–10.3)
Creatinine, Ser: 0.85 mg/dL (ref 0.61–1.24)
Glucose, Bld: 102 mg/dL — ABNORMAL HIGH (ref 65–99)
POTASSIUM: 4.9 mmol/L (ref 3.5–5.1)
SODIUM: 136 mmol/L (ref 135–145)

## 2015-03-04 MED ORDER — PROPOFOL 10 MG/ML IV BOLUS
40.0000 mg | Freq: Once | INTRAVENOUS | Status: AC
  Administered 2015-03-04: 40 mg via INTRAVENOUS
  Filled 2015-03-04: qty 20

## 2015-03-04 MED ORDER — DILTIAZEM HCL 100 MG IV SOLR: 5.0000 mg/h | INTRAVENOUS | Status: DC

## 2015-03-04 MED ORDER — DILTIAZEM LOAD VIA INFUSION: 20.0000 mg | Freq: Once | INTRAVENOUS | Status: DC

## 2015-03-04 MED ORDER — PROPOFOL 10 MG/ML IV BOLUS
20.0000 mg | Freq: Once | INTRAVENOUS | Status: AC
  Administered 2015-03-04: 20 mg via INTRAVENOUS

## 2015-03-04 NOTE — ED Provider Notes (Signed)
CSN: [id number]     Arrival date & time 03/04/15  1355 History   First MD Initiated Contact with Patient 03/04/15 1434     Chief Complaint  Patient presents with  . Palpitations  . Atrial Fibrillation     (Consider location/radiation/quality/duration/timing/severity/associated sxs/prior Treatment) Patient is a 82 y.o. male presenting with palpitations. The history is provided by the patient.  Palpitations Palpitations quality:  Irregular Onset quality:  Sudden Duration:  1 day Timing:  Constant Progression:  Unchanged Chronicity:  Recurrent Relieved by:  Nothing Worsened by:  Nothing Ineffective treatments:  None tried Associated symptoms: no chest pain, no lower extremity edema, no near-syncope and no shortness of breath   Associated symptoms comment:  Lightheaded Risk factors: hx of atrial fibrillation     Past Medical History  Diagnosis Date  . HYPERLIPIDEMIA 09/29/2006  . ERECTILE DYSFUNCTION 09/29/2006  . 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 ABUSE, ALCOHOL, IN REMISSION 04/08/2007  . Other specified forms of hearing loss 08/10/2009  . HYPERTENSION 09/29/2006  . VENTRICULAR TACHYCARDIA 09/29/2006  . SINUSITIS- ACUTE-NOS 06/23/2007  . ALLERGIC RHINITIS 09/29/2006  . BENIGN PROSTATIC HYPERTROPHY 09/29/2006  . OSTEOARTHROSIS NOS, OTHER [facility] SITE 09/29/2006  . BACK PAIN 09/12/2008  . LEG PAIN, RIGHT 10/02/2008  . VERTIGO 04/08/2007  . COLONIC POLYPS, HX OF 06/23/2007  . CHEST PAIN 07/04/2008  . FATIGUE 11/16/2009  . VIRAL GASTROENTERITIS 01/29/2010  . GLUCOSE INTOLERANCE 03/19/2010  . Atrial flutter ([place]) 03/28/2010    a. s/p prior rfca;  b. recurrent paroxysmal flutter 04/2012;  c. pradaxa initiated 04/2012.  [name] Long term (current) use of anticoagulants 04/26/2010  . Dysrhythmia, cardiac 2012    atrial flutter  . Diverticulosis   . MELANOMA, MALIGNANT, SKIN NOS 09/29/2006    other skin cancers -no further melanoma  . Hearing loss     hard of hearing   Past Surgical History   Procedure Laterality Date  . Left rotato cuff    . Joint replacement      Right knee  . Tonsillectomy    . Left knee arthroscopy    . Cataract extraction, bilateral Bilateral   . Colonoscopy with propofol N/A 12/08/2014    Procedure: COLONOSCOPY WITH PROPOFOL;  Surgeon: [name], MD;  Location: [facility];  Service: Endoscopy;  Laterality: N/A;   Family History  Problem Relation Age of Onset  . Diabetes Brother   . Esophageal cancer Father   . Colon cancer Neg Hx   . Stomach cancer Neg Hx    Social History  Substance Use Topics  . Smoking status: Former Research scientist (life sciences)  . Smokeless tobacco: Never Used     Comment: Quit smoking and drinking 20 years ago  . Alcohol Use: No    Review of Systems  Respiratory: Negative for shortness of breath.   Cardiovascular: Positive for palpitations. Negative for chest pain and near-syncope.  All other systems reviewed and are negative.     Allergies  Ace inhibitors and Amiodarone hcl  Home Medications   Prior to Admission medications   Medication Sig Start Date End Date Taking? Authorizing Provider  ALPRAZolam Duanne Moron) 0.5 MG tablet Take 1 tablet (0.5 mg total) by mouth daily as needed for anxiety. 11/22/14  Yes Biagio Borg, MD  atorvastatin (LIPITOR) 10 MG tablet Take 1 tablet (10 mg total) by mouth daily. 11/01/14  Yes Biagio Borg, MD  diltiazem (MATZIM LA) 360 MG 24 hr tablet Take 1 tablet (360 mg total) by mouth daily. 03/06/14  Yes Deboraha Sprang, MD  fish oil-omega-3 fatty acids 1000 MG capsule Take 1 g by mouth daily.    Yes Historical Provider, MD  HYDROcodone-acetaminophen (NORCO/VICODIN) 5-325 MG tablet Take 1 tablet by mouth daily as needed. Patient taking differently: Take 1 tablet by mouth daily as needed for moderate pain or severe pain.  01/30/15  Yes Biagio Borg, MD  metoprolol succinate (TOPROL-XL) 100 MG 24 hr tablet Take 1 tablet (100 mg total) by mouth daily. Take with or immediately following a meal. Patient taking  differently: Take 50 mg by mouth every morning. Take with or immediately following a meal. 08/17/14  Yes Deboraha Sprang, MD  Multiple Vitamin (MULTIVITAMIN) tablet Take 1 tablet by mouth daily.     Yes Historical Provider, MD  Probiotic Product (RESTORA) CAPS Take 1 capsule by mouth daily. 12/08/12  Yes Irene Shipper, MD  psyllium (METAMUCIL) 58.6 % packet Take 1 packet by mouth daily.   Yes Historical Provider, MD  warfarin (COUMADIN) 5 MG tablet Take 1-1 1/2 tablets daily as directed by anticoagulation clinic Patient taking differently: Take 5-7.5 mg by mouth daily. Take 5 mg by mouth daily Tue, Thurs, and Sat. Take 7.5 mg by mouth on all other days. 11/17/14  Yes Deboraha Sprang, MD   BP 120/89 mmHg  Pulse 147  Temp(Src) 97.4 F (36.3 C) (Oral)  Resp 19  Wt 175 lb 7 oz (79.578 kg)  SpO2 95% Physical Exam  Constitutional: He is oriented to person, place, and time. He appears well-developed and well-nourished. No distress.  HENT:  Head: Normocephalic and atraumatic.  Eyes: Conjunctivae are normal.  Neck: Neck supple. No tracheal deviation present.  Cardiovascular: Normal heart sounds.  An irregularly irregular rhythm present. Tachycardia present.   Pulmonary/Chest: Effort normal and breath sounds normal. No respiratory distress.  Abdominal: Soft. He exhibits no distension.  Neurological: He is alert and oriented to person, place, and time.  Skin: Skin is warm and dry.  Psychiatric: He has a normal mood and affect.    ED Course  .Cardioversion Date/Time: 03/04/2015 4:09 PM Performed by: Leo Grosser Authorized by: Leo Grosser Consent: Verbal consent obtained. Written consent obtained. Risks and benefits: risks, benefits and alternatives were discussed Consent given by: patient Patient understanding: patient states understanding of the procedure being performed Patient consent: the patient's understanding of the procedure matches consent given Procedure consent: procedure consent  matches procedure scheduled Relevant documents: relevant documents present and verified Test results: test results available and properly labeled Site marked: the operative site was marked Imaging studies: imaging studies available Required items: required blood products, implants, devices, and special equipment available Patient identity confirmed: verbally with patient, arm band, provided demographic data and hospital-assigned identification number Time out: Immediately prior to procedure a "time out" was called to verify the correct patient, procedure, equipment, support staff and site/side marked as required. Patient sedated: yes Sedation type: moderate (conscious) sedation Sedatives: propofol Vitals: Vital signs were monitored during sedation. Cardioversion basis: elective Indications: hemodynamic instability Pre-procedure rhythm: atrial fibrillation Patient position: patient was placed in a supine position Chest area: chest area exposed Electrodes: pads Electrodes placed: anterior-posterior Number of attempts: 1 Attempt 1 mode: synchronous Attempt 1 waveform: biphasic Attempt 1 shock (in Joules): 150 Attempt 1 outcome: conversion to normal sinus rhythm Post-procedure rhythm: normal sinus rhythm Complications: no complications Patient tolerance: Patient tolerated the procedure well with no immediate complications   (including critical care time)  CRITICAL CARE Performed by: Wandra Mannan  critical care time: 30 minutes Critical care time was exclusive of separately billable procedures and treating other patients. Critical care was necessary to treat or prevent imminent or life-threatening deterioration. Critical care was time spent personally by me on the following activities: development of treatment plan with patient and/or surrogate as well as nursing, discussions with consultants, evaluation of patient's response to treatment, examination of patient, obtaining history  from patient or surrogate, ordering and performing treatments and interventions, ordering and review of laboratory studies, ordering and review of radiographic studies, pulse oximetry and re-evaluation of patient's condition.   Procedural sedation Performed by: Leo Grosser Consent: Verbal consent obtained. Risks and benefits: risks, benefits and alternatives were discussed Required items: required blood products, implants, devices, and special equipment available Patient identity confirmed: arm band and provided demographic data Time out: Immediately prior to procedure a "time out" was called to verify the correct patient, procedure, equipment, support staff and site/side marked as required.  Sedation type: moderate (conscious) sedation NPO time confirmed and considedered  Sedatives: PROPOFOL  Physician Time at Bedside: 20 minutes  Vitals: Vital signs were monitored during sedation. Cardiac Monitor, pulse oximeter Patient tolerance: Patient tolerated the procedure well with no immediate complications. Comments: Pt with uneventful recovered. Returned to pre-procedural sedation baseline     Labs Review Labs Reviewed  BASIC METABOLIC PANEL - Abnormal; Notable for the following:    Chloride 98 (*)    Glucose, Bld 102 (*)    All other components within normal limits  PROTIME-INR - Abnormal; Notable for the following:    Prothrombin Time 26.2 (*)    INR 2.43 (*)    All other components within normal limits  CBC  I-STAT TROPOININ, ED    Imaging Review Dg Chest Port 1 View  03/04/2015  CLINICAL DATA:  Heart palpitations. EXAM: PORTABLE CHEST 1 VIEW COMPARISON:  11/10/2014. FINDINGS: 1430 hours. Low volume film without focal airspace consolidation, pulmonary edema, or pleural effusion. Cardiopericardial silhouette is at upper limits of normal for size. The visualized bony structures of the thorax are intact. Telemetry leads overlie the chest. IMPRESSION: No acute cardiopulmonary  findings. Electronically Signed   By: Misty Stanley M.D.   On: 03/04/2015 14:40   I have personally reviewed and evaluated these images and lab results as part of my medical decision-making.   EKG Interpretation   Date/Time:  Sunday March 04 2015 14:00:03 EST Ventricular Rate:  145 PR Interval:    QRS Duration: 130 QT Interval:  332 QTC Calculation: 515 R Axis:   47 Text Interpretation:  Atrial fibrillation with rapid ventricular response  Right bundle branch block Abnormal ECG Since last tracing rate faster  Confirmed by Jaydence Arnesen MD, Clenton Esper [phone]) on 03/04/2015 2:34:46 PM      EKG Interpretation  Date/Time:  Sunday March 04 2015 16:01:20 EST Ventricular Rate:  78 PR Interval:  240 QRS Duration: 143 QT Interval:  397 QTC Calculation: 452 R Axis:   14 Text Interpretation:  Sinus rhythm Prolonged PR interval Right bundle branch block Since last tracing Sinus rhythm NOW PRESENT Confirmed by Adriannah Steinkamp MD, Quillian Quince [id number]) on 03/04/2015 4:11:45 PM        MDM   Final diagnoses:  Atrial fibrillation with RVR ([place])    82 y.o. male presents with feeling lightheaded and weak since feeling himself go into atrial fib/flutter roughly 24 hours ago. Current HR varying from 120-160. Pt having intermittent rate dependent drops in BP during ED course into [id number] systolic. Has known history of  flutter and sees EP, on dilt and metoprolol for rate/rhythm control and anticoagulated on coumadin. INR is therapeutic today. Discussed possibility of elective cardioversion to avoid IP stay and drug infusions. I was able to speak with Dr Ellyn Hack who was in ED for another patient and he agreed to help arrange f/u if successful. Pt cardioverted to sinus rhythm as above with success after 1 attempt. Recommended follow up with his EP following this visit and return precautions discussed for new or worsening concerning symptoms.     Leo Grosser, MD 03/05/15 1050

## 2015-03-04 NOTE — Discharge Instructions (Signed)

## 2015-03-04 NOTE — ED Notes (Signed)
Pt reports having palpitations, checks his HR at home and reports rate in 140's. Hx of afib.

## 2015-03-05 NOTE — ED Provider Notes (Signed)
Patient care assumed at 4pm.   After cardioversion, patient continued to remain in sinus rhythm for short period of observation.  He woke up from his propofol sedation w/o difficulty and came back to baseline.  He felt well and vitals continued to remain stable.  He was d/c with instructions to f/u with his cardiologist and return to the ED if he had any concerns.   Jarome Matin, MD 03/05/15 [id number]  Leo Grosser, MD 03/05/15 1050

## 2015-03-06 NOTE — Telephone Encounter (Signed)
Patient has an appointment 03/07/15 with Dr. [name].

## 2015-03-07 ENCOUNTER — Encounter: Payer: Self-pay | Admitting: Internal Medicine

## 2015-03-07 ENCOUNTER — Encounter: Payer: Self-pay | Admitting: *Deleted

## 2015-03-07 ENCOUNTER — Ambulatory Visit (INDEPENDENT_AMBULATORY_CARE_PROVIDER_SITE_OTHER): Payer: Medicare Other | Admitting: Internal Medicine

## 2015-03-07 VITALS — BP 126/72 | HR 62 | Ht 72.0 in | Wt 179.1 lb

## 2015-03-07 DIAGNOSIS — I4892 Unspecified atrial flutter: Secondary | ICD-10-CM

## 2015-03-07 DIAGNOSIS — I480 Paroxysmal atrial fibrillation: Secondary | ICD-10-CM | POA: Diagnosis not present

## 2015-03-07 DIAGNOSIS — I484 Atypical atrial flutter: Secondary | ICD-10-CM | POA: Diagnosis not present

## 2015-03-07 DIAGNOSIS — I200 Unstable angina: Secondary | ICD-10-CM | POA: Diagnosis not present

## 2015-03-07 LAB — POCT INR: INR: 3.2

## 2015-03-07 MED ORDER — DILTIAZEM HCL ER COATED BEADS 360 MG PO TB24: 360.0000 mg | ORAL_TABLET | Freq: Every day | ORAL | Status: DC

## 2015-03-07 NOTE — Patient Instructions (Signed)
Medication Instructions: - Your physician recommends that you continue on your current medications as directed. Please refer to the Current Medication list given to you today.  Labwork: - none  Procedures/Testing: - Your physician has requested that you have a cardiac catheterization. Cardiac catheterization is used to diagnose and/or treat various heart conditions. Doctors may recommend this procedure for a number of different reasons. The most common reason is to evaluate chest pain. Chest pain can be a symptom of coronary artery disease (CAD), and cardiac catheterization can show whether plaque is narrowing or blocking your heart's arteries. This procedure is also used to evaluate the valves, as well as measure the blood flow and oxygen levels in different parts of your heart. For further information please visit [email or web address]. Please follow instruction sheet, as given.  Follow-Up: - Your physician recommends that you schedule a follow-up appointment in: 4 weeks with Dr. [name].  Any Additional Special Instructions Will Be Listed Below (If Applicable).     If you need a refill on your cardiac medications before your next appointment, please call your pharmacy.

## 2015-03-07 NOTE — Progress Notes (Signed)
Patient Care Team: Biagio Borg, MD as PCP - General   HPI  Jonathan Orozco is a 82 y.o. male Seen in followup for outflow tract ectopy and atrial flutters for which  he underwent EP testing and 3-D mapping which failed to produce stable arrhythmias; medical therapy was undertaken.  He has been intolerant of amiodarone. 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 That has not been history of late.  He is been managed with rate control and anticoagulation with warfarin. Previously he had been on NOACs but was intolerant of Pradaxa because of bloating; he is now on warfarin.    10/16 he ended up with recurrent tachypalpitations and a Jonathan Orozco was found to be in atypical atrial flutter and was cardioverted  ER records labs and ECG were reviewed  Earlier this week he re-presented to the emergency room and tachycardia. He was cardioverted to sinus rhythm and discharged.   He continues to complain of exercise intolerance. It is characterized by shortness of breath chest tightness and a need to stop. Low-level exercise treadmill is associated with reproducible discomfort but not as limiting. He had a catheterization remotely according to him and his wife about 15 years ago. He has had no interval assessment of coronary perfusion.   He is known to have sinus bradycardia with presumed poor heart rate excursion  Last echo 2010 normal LV function  Past Medical History  Diagnosis Date  . HYPERLIPIDEMIA 09/29/2006  . ERECTILE DYSFUNCTION 09/29/2006  . 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 ABUSE, ALCOHOL, IN REMISSION 04/08/2007  . Other specified forms of hearing loss 08/10/2009  . HYPERTENSION 09/29/2006  . VENTRICULAR TACHYCARDIA 09/29/2006  . SINUSITIS- ACUTE-NOS 06/23/2007  . ALLERGIC RHINITIS 09/29/2006  . BENIGN PROSTATIC HYPERTROPHY 09/29/2006  . OSTEOARTHROSIS NOS, OTHER [facility] SITE 09/29/2006  . BACK PAIN 09/12/2008  . LEG  PAIN, RIGHT 10/02/2008  . VERTIGO 04/08/2007  . COLONIC POLYPS, HX OF 06/23/2007  . CHEST PAIN 07/04/2008  . FATIGUE 11/16/2009  . VIRAL GASTROENTERITIS 01/29/2010  . GLUCOSE INTOLERANCE 03/19/2010  . Atrial flutter ([place]) 03/28/2010    a. s/p prior rfca;  b. recurrent paroxysmal flutter 04/2012;  c. pradaxa initiated 04/2012.  [name] Long term (current) use of anticoagulants 04/26/2010  . Dysrhythmia, cardiac 2012    atrial flutter  . Diverticulosis   . MELANOMA, MALIGNANT, SKIN NOS 09/29/2006    other skin cancers -no further melanoma  . Hearing loss     hard of hearing    Past Surgical History  Procedure Laterality Date  . Left rotato cuff    . Joint replacement      Right knee  . Tonsillectomy    . Left knee arthroscopy    . Cataract extraction, bilateral Bilateral   . Colonoscopy with propofol N/A 12/08/2014    Procedure: COLONOSCOPY WITH PROPOFOL;  Surgeon: [name], MD;  Location: [facility];  Service: Endoscopy;  Laterality: N/A;    Current Outpatient Prescriptions  Medication Sig Dispense Refill  . ALPRAZolam (XANAX) 0.5 MG tablet Take 1 tablet (0.5 mg total) by mouth daily as needed for anxiety. 30 tablet 2  . atorvastatin (LIPITOR) 10 MG tablet Take 1 tablet (10 mg total) by mouth daily. 90 tablet 3  . diltiazem (MATZIM LA) 360 MG 24 hr tablet Take 1 tablet (360 mg total) by mouth daily. 90 tablet 3  . fish oil-omega-3 fatty acids 1000 MG capsule Take 1 g  by mouth daily.     [name] HYDROcodone-acetaminophen (NORCO/VICODIN) 5-325 MG tablet Take 1 tablet by mouth every 6 (six) hours as needed for moderate pain (body pain).    . metoprolol succinate (TOPROL-XL) 100 MG 24 hr tablet Take 50 mg by mouth daily. Take with or immediately following a meal.    . Multiple Vitamin (MULTIVITAMIN) tablet Take 1 tablet by mouth daily.      . Probiotic Product (RESTORA) CAPS Take 1 capsule by mouth daily. 10 capsule 0  . psyllium (METAMUCIL) 58.6 % packet Take 1 packet by mouth daily.    [name] warfarin  (COUMADIN) 5 MG tablet Take 1-1 1/2 tablets daily as directed by anticoagulation clinic (Patient taking differently: Take 5-7.5 mg by mouth daily. Take 5 mg by mouth daily Tue, Thurs, and Sat. Take 7.5 mg by mouth on all other days.) 135 tablet 1   No current facility-administered medications for this visit.    Allergies  Allergen Reactions  . Ace Inhibitors     REACTION: cough  . Amiodarone Hcl     REACTION: intolerance    Review of Systems negative except from HPI and PMH  Physical Exam BP 126/72 mmHg  Pulse 62  Ht 6' (1.829 m)  Wt 179 lb 2 oz (81.251 kg)  BMI 24.29 kg/m2 Well developed and nourished in no acute distress HENT normal Neck supple with JVP-flat Clear Regular rate and rhythm, no murmurs or gallops Abd-soft with active BS No Clubbing cyanosis edema Skin-warm and dry A & Oriented  Grossly normal sensory and motor function   ECG demonstrates sinus rhythm  at 62 Intervals 20/14/45 Axis XLVI    Assessment and  Plan  Atrial fibrillation/flutter paroxysmal   Sinus bradycardia  Hypertension  Angina pectoris    He is having crescendo angina. I have reviewed his story with Dr. Jeralene Peters Smith's. We will plan to proceed with catheterization. His pretest probabilities are sufficiently high and a negative Myoview would be insufficiently assuring. He was out of rhythm for less than 24 hours prior to his cardioversion over the weekend. Hence, we will discontinue the Coumadin. I will start one aspirin concurrently.

## 2015-03-12 ENCOUNTER — Other Ambulatory Visit: Payer: Self-pay | Admitting: *Deleted

## 2015-03-12 ENCOUNTER — Inpatient Hospital Stay (HOSPITAL_COMMUNITY)
Admission: AD | Admit: 2015-03-12 | Discharge: 2015-03-23 | DRG: 234 | Disposition: A | Discharge status: Home Health | Payer: Medicare Other | Source: Ambulatory Visit | Attending: Thoracic Surgery (Cardiothoracic Vascular Surgery) | Admitting: Thoracic Surgery (Cardiothoracic Vascular Surgery)

## 2015-03-12 ENCOUNTER — Encounter (HOSPITAL_COMMUNITY)
Admission: AD | Disposition: A | Discharge status: Home Health | Payer: Self-pay | Source: Ambulatory Visit | Attending: Thoracic Surgery (Cardiothoracic Vascular Surgery)

## 2015-03-12 ENCOUNTER — Encounter (HOSPITAL_COMMUNITY): Payer: Self-pay | Admitting: General Practice

## 2015-03-12 DIAGNOSIS — Z7901 Long term (current) use of anticoagulants: Secondary | ICD-10-CM | POA: Diagnosis not present

## 2015-03-12 DIAGNOSIS — Z888 Allergy status to other drugs, medicaments and biological substances status: Secondary | ICD-10-CM

## 2015-03-12 DIAGNOSIS — F419 Anxiety disorder, unspecified: Secondary | ICD-10-CM | POA: Diagnosis present

## 2015-03-12 DIAGNOSIS — I4729 Other ventricular tachycardia: Secondary | ICD-10-CM

## 2015-03-12 DIAGNOSIS — E785 Hyperlipidemia, unspecified: Secondary | ICD-10-CM | POA: Diagnosis present

## 2015-03-12 DIAGNOSIS — I9789 Other postprocedural complications and disorders of the circulatory system, not elsewhere classified: Secondary | ICD-10-CM | POA: Diagnosis not present

## 2015-03-12 DIAGNOSIS — H9190 Unspecified hearing loss, unspecified ear: Secondary | ICD-10-CM | POA: Diagnosis present

## 2015-03-12 DIAGNOSIS — I208 Other forms of angina pectoris: Secondary | ICD-10-CM | POA: Diagnosis present

## 2015-03-12 DIAGNOSIS — I484 Atypical atrial flutter: Secondary | ICD-10-CM

## 2015-03-12 DIAGNOSIS — D696 Thrombocytopenia, unspecified: Secondary | ICD-10-CM | POA: Diagnosis present

## 2015-03-12 DIAGNOSIS — N529 Male erectile dysfunction, unspecified: Secondary | ICD-10-CM | POA: Diagnosis present

## 2015-03-12 DIAGNOSIS — Z87891 Personal history of nicotine dependence: Secondary | ICD-10-CM

## 2015-03-12 DIAGNOSIS — I480 Paroxysmal atrial fibrillation: Secondary | ICD-10-CM

## 2015-03-12 DIAGNOSIS — E8770 Fluid overload, unspecified: Secondary | ICD-10-CM | POA: Diagnosis not present

## 2015-03-12 DIAGNOSIS — I472 Ventricular tachycardia, unspecified: Secondary | ICD-10-CM

## 2015-03-12 DIAGNOSIS — Z951 Presence of aortocoronary bypass graft: Secondary | ICD-10-CM

## 2015-03-12 DIAGNOSIS — D62 Acute posthemorrhagic anemia: Secondary | ICD-10-CM | POA: Diagnosis not present

## 2015-03-12 DIAGNOSIS — Z85820 Personal history of malignant melanoma of skin: Secondary | ICD-10-CM

## 2015-03-12 DIAGNOSIS — Y838 Other surgical procedures as the cause of abnormal reaction of the patient, or of later complication, without mention of misadventure at the time of the procedure: Secondary | ICD-10-CM | POA: Diagnosis present

## 2015-03-12 DIAGNOSIS — Z8679 Personal history of other diseases of the circulatory system: Secondary | ICD-10-CM | POA: Diagnosis not present

## 2015-03-12 DIAGNOSIS — J9811 Atelectasis: Secondary | ICD-10-CM

## 2015-03-12 DIAGNOSIS — I2510 Atherosclerotic heart disease of native coronary artery without angina pectoris: Secondary | ICD-10-CM | POA: Diagnosis not present

## 2015-03-12 DIAGNOSIS — I4892 Unspecified atrial flutter: Secondary | ICD-10-CM

## 2015-03-12 DIAGNOSIS — Z96651 Presence of right artificial knee joint: Secondary | ICD-10-CM | POA: Diagnosis present

## 2015-03-12 DIAGNOSIS — N400 Enlarged prostate without lower urinary tract symptoms: Secondary | ICD-10-CM | POA: Diagnosis present

## 2015-03-12 DIAGNOSIS — I5030 Unspecified diastolic (congestive) heart failure: Secondary | ICD-10-CM | POA: Diagnosis not present

## 2015-03-12 DIAGNOSIS — F411 Generalized anxiety disorder: Secondary | ICD-10-CM

## 2015-03-12 DIAGNOSIS — I10 Essential (primary) hypertension: Secondary | ICD-10-CM | POA: Diagnosis present

## 2015-03-12 DIAGNOSIS — R195 Other fecal abnormalities: Secondary | ICD-10-CM | POA: Diagnosis present

## 2015-03-12 DIAGNOSIS — I483 Typical atrial flutter: Secondary | ICD-10-CM | POA: Diagnosis present

## 2015-03-12 DIAGNOSIS — I2089 Other forms of angina pectoris: Secondary | ICD-10-CM | POA: Diagnosis present

## 2015-03-12 DIAGNOSIS — I25110 Atherosclerotic heart disease of native coronary artery with unstable angina pectoris: Secondary | ICD-10-CM | POA: Diagnosis present

## 2015-03-12 DIAGNOSIS — I4891 Unspecified atrial fibrillation: Secondary | ICD-10-CM | POA: Insufficient documentation

## 2015-03-12 DIAGNOSIS — I200 Unstable angina: Secondary | ICD-10-CM | POA: Insufficient documentation

## 2015-03-12 DIAGNOSIS — Z79899 Other long term (current) drug therapy: Secondary | ICD-10-CM | POA: Diagnosis not present

## 2015-03-12 HISTORY — PX: CARDIAC CATHETERIZATION: SHX172

## 2015-03-12 LAB — PROTIME-INR
INR: 1.18 (ref 0.00–1.49)
PROTHROMBIN TIME: 15.2 s (ref 11.6–15.2)

## 2015-03-12 SURGERY — LEFT HEART CATH AND CORONARY ANGIOGRAPHY
Anesthesia: LOCAL

## 2015-03-12 MED ORDER — METOPROLOL SUCCINATE ER 50 MG PO TB24
50.0000 mg | ORAL_TABLET | Freq: Every day | ORAL | Status: DC
  Administered 2015-03-13: 50 mg via ORAL
  Filled 2015-03-12: qty 1

## 2015-03-12 MED ORDER — FENTANYL CITRATE (PF) 100 MCG/2ML IJ SOLN
INTRAMUSCULAR | Status: DC | PRN
  Administered 2015-03-12 (×2): 50 ug via INTRAVENOUS

## 2015-03-12 MED ORDER — SODIUM CHLORIDE 0.9% FLUSH: 3.0000 mL | Freq: Two times a day (BID) | INTRAVENOUS | Status: DC

## 2015-03-12 MED ORDER — HEPARIN (PORCINE) IN NACL 100-0.45 UNIT/ML-% IJ SOLN
1250.0000 [IU]/h | INTRAMUSCULAR | Status: DC
  Administered 2015-03-12: 1050 [IU]/h via INTRAVENOUS
  Filled 2015-03-12 (×2): qty 250

## 2015-03-12 MED ORDER — SODIUM CHLORIDE 0.9% FLUSH: 3.0000 mL | INTRAVENOUS | Status: DC | PRN

## 2015-03-12 MED ORDER — ONDANSETRON HCL 4 MG/2ML IJ SOLN: 4.0000 mg | Freq: Four times a day (QID) | INTRAMUSCULAR | Status: DC | PRN

## 2015-03-12 MED ORDER — LIDOCAINE HCL (PF) 1 % IJ SOLN
INTRAMUSCULAR | Status: DC | PRN
  Administered 2015-03-12: 2 mL via SUBCUTANEOUS

## 2015-03-12 MED ORDER — DILTIAZEM HCL ER COATED BEADS 120 MG PO CP24
360.0000 mg | ORAL_CAPSULE | Freq: Every day | ORAL | Status: DC
  Administered 2015-03-13: 360 mg via ORAL
  Filled 2015-03-12: qty 1
  Filled 2015-03-12: qty 3

## 2015-03-12 MED ORDER — SODIUM CHLORIDE 0.9% FLUSH
3.0000 mL | Freq: Two times a day (BID) | INTRAVENOUS | Status: DC
  Administered 2015-03-12: 3 mL via INTRAVENOUS

## 2015-03-12 MED ORDER — SODIUM CHLORIDE 0.9 % IV SOLN: 250.0000 mL | INTRAVENOUS | Status: DC | PRN

## 2015-03-12 MED ORDER — MIDAZOLAM HCL 2 MG/2ML IJ SOLN
INTRAMUSCULAR | Status: DC | PRN
Start: 2015-03-12 — End: 2015-03-12
  Administered 2015-03-12 (×2): 1 mg via INTRAVENOUS

## 2015-03-12 MED ORDER — MIDAZOLAM HCL 2 MG/2ML IJ SOLN
INTRAMUSCULAR | Status: AC
  Filled 2015-03-12: qty 2

## 2015-03-12 MED ORDER — ASPIRIN 81 MG PO CHEW
CHEWABLE_TABLET | ORAL | Status: AC
  Administered 2015-03-12: 81 mg via ORAL
  Filled 2015-03-12: qty 1

## 2015-03-12 MED ORDER — FENTANYL CITRATE (PF) 100 MCG/2ML IJ SOLN
INTRAMUSCULAR | Status: AC
  Filled 2015-03-12: qty 2

## 2015-03-12 MED ORDER — ACETAMINOPHEN 325 MG PO TABS: 650.0000 mg | ORAL_TABLET | ORAL | Status: DC | PRN

## 2015-03-12 MED ORDER — SODIUM CHLORIDE 0.9 % WEIGHT BASED INFUSION
3.0000 mL/kg/h | INTRAVENOUS | Status: DC
  Administered 2015-03-12: 3 mL/kg/h via INTRAVENOUS

## 2015-03-12 MED ORDER — SODIUM CHLORIDE 0.9 % IV SOLN
250.0000 mL | INTRAVENOUS | Status: DC | PRN
Start: 2015-03-12 — End: 2015-03-14

## 2015-03-12 MED ORDER — ADULT MULTIVITAMIN W/MINERALS CH
1.0000 | ORAL_TABLET | Freq: Every day | ORAL | Status: DC
  Administered 2015-03-13 – 2015-03-23 (×10): 1 via ORAL
  Filled 2015-03-12 (×10): qty 1

## 2015-03-12 MED ORDER — OXYCODONE-ACETAMINOPHEN 5-325 MG PO TABS
1.0000 | ORAL_TABLET | ORAL | Status: DC | PRN
  Administered 2015-03-13: 1 via ORAL
  Filled 2015-03-12: qty 1

## 2015-03-12 MED ORDER — HEPARIN (PORCINE) IN NACL 2-0.9 UNIT/ML-% IJ SOLN
INTRAMUSCULAR | Status: DC | PRN
  Administered 2015-03-12: 1000 mL

## 2015-03-12 MED ORDER — HEPARIN SODIUM (PORCINE) 1000 UNIT/ML IJ SOLN
INTRAMUSCULAR | Status: AC
  Filled 2015-03-12: qty 1

## 2015-03-12 MED ORDER — ASPIRIN 81 MG PO CHEW
81.0000 mg | CHEWABLE_TABLET | ORAL | Status: AC
  Administered 2015-03-12: 81 mg via ORAL

## 2015-03-12 MED ORDER — PSYLLIUM 95 % PO PACK
1.0000 | PACK | Freq: Every day | ORAL | Status: DC
  Administered 2015-03-12 – 2015-03-13 (×2): 1 via ORAL
  Filled 2015-03-12 (×2): qty 1

## 2015-03-12 MED ORDER — HEPARIN (PORCINE) IN NACL 2-0.9 UNIT/ML-% IJ SOLN
INTRAMUSCULAR | Status: AC
  Filled 2015-03-12: qty 500

## 2015-03-12 MED ORDER — VERAPAMIL HCL 2.5 MG/ML IV SOLN
INTRAVENOUS | Status: DC | PRN
  Administered 2015-03-12: 10:00:00 via INTRA_ARTERIAL

## 2015-03-12 MED ORDER — SODIUM CHLORIDE 0.9 % WEIGHT BASED INFUSION: 1.0000 mL/kg/h | INTRAVENOUS | Status: DC

## 2015-03-12 MED ORDER — OMEGA-3-ACID ETHYL ESTERS 1 G PO CAPS
1.0000 g | ORAL_CAPSULE | Freq: Every day | ORAL | Status: DC
  Administered 2015-03-13: 1 g via ORAL
  Filled 2015-03-12: qty 1

## 2015-03-12 MED ORDER — ASPIRIN 81 MG PO CHEW
81.0000 mg | CHEWABLE_TABLET | Freq: Every day | ORAL | Status: DC
  Administered 2015-03-13: 81 mg via ORAL
  Filled 2015-03-12: qty 1

## 2015-03-12 MED ORDER — ALPRAZOLAM 0.5 MG PO TABS
0.5000 mg | ORAL_TABLET | Freq: Every day | ORAL | Status: DC | PRN
  Administered 2015-03-12: 0.5 mg via ORAL
  Filled 2015-03-12: qty 1

## 2015-03-12 MED ORDER — HEPARIN SODIUM (PORCINE) 1000 UNIT/ML IJ SOLN
INTRAMUSCULAR | Status: DC | PRN
  Administered 2015-03-12: 4000 [IU] via INTRAVENOUS

## 2015-03-12 MED ORDER — VERAPAMIL HCL 2.5 MG/ML IV SOLN
INTRAVENOUS | Status: AC
  Filled 2015-03-12: qty 2

## 2015-03-12 MED ORDER — IOHEXOL 350 MG/ML SOLN
INTRAVENOUS | Status: DC | PRN
  Administered 2015-03-12: 80 mL via INTRACARDIAC

## 2015-03-12 MED ORDER — NITROGLYCERIN 1 MG/10 ML FOR IR/CATH LAB
INTRA_ARTERIAL | Status: AC
  Filled 2015-03-12: qty 10

## 2015-03-12 MED ORDER — SODIUM CHLORIDE 0.9 % WEIGHT BASED INFUSION: 1.0000 mL/kg/h | INTRAVENOUS | Status: AC

## 2015-03-12 MED ORDER — ATORVASTATIN CALCIUM 10 MG PO TABS
10.0000 mg | ORAL_TABLET | Freq: Every day | ORAL | Status: DC
Start: 2015-03-13 — End: 2015-03-23
  Administered 2015-03-13 – 2015-03-23 (×10): 10 mg via ORAL
  Filled 2015-03-12 (×10): qty 1

## 2015-03-12 MED ORDER — LIDOCAINE HCL (PF) 1 % IJ SOLN
INTRAMUSCULAR | Status: AC
  Filled 2015-03-12: qty 30

## 2015-03-12 SURGICAL SUPPLY — 9 items
CATH INFINITI 5 FR JL3.5 (CATHETERS) ×2 IMPLANT
CATH INFINITI JR4 5F (CATHETERS) ×2 IMPLANT
DEVICE RAD COMP TR BAND LRG (VASCULAR PRODUCTS) ×2 IMPLANT
GLIDESHEATH SLEND A-KIT 6F 22G (SHEATH) ×2 IMPLANT
KIT HEART LEFT (KITS) ×2 IMPLANT
PACK CARDIAC CATHETERIZATION (CUSTOM PROCEDURE TRAY) ×2 IMPLANT
TRANSDUCER W/STOPCOCK (MISCELLANEOUS) ×2 IMPLANT
TUBING CIL FLEX 10 FLL-RA (TUBING) ×2 IMPLANT
WIRE SAFE-T 1.5MM-J .035X260CM (WIRE) ×2 IMPLANT

## 2015-03-12 NOTE — Interval H&P Note (Signed)
Cath Lab Visit (complete for each Cath Lab visit)  Clinical Evaluation Leading to the Procedure:   ACS: No.  Non-ACS:    Anginal Classification: CCS III  Anti-ischemic medical therapy: Maximal Therapy (2 or more classes of medications)  Non-Invasive Test Results: No non-invasive testing performed  Prior CABG: No previous CABG      History and Physical Interval Note:  03/12/2015 6:13 AM  Jonathan Orozco  has presented today for surgery, with the diagnosis of [place]  The various methods of treatment have been discussed with the patient and family. After consideration of risks, benefits and other options for treatment, the patient has consented to  Procedure(s): Left Heart Cath and Coronary Angiography (N/A) as a surgical intervention .  The patient's history has been reviewed, patient examined, no change in status, stable for surgery.  I have reviewed the patient's chart and labs.  Questions were answered to the patient's satisfaction.     Sinclair Grooms

## 2015-03-12 NOTE — Consult Note (Signed)
Reason for Consult:Left main and 2 vessel CAD Referring Physician: Dr. Tamala Julian Dr. [name] is an 82 y.o. male.  HPI: 82 yo man who presents with a cc/o chest discomfort with exertion.  Mr. Kudrick is an 82 yo man with a PMH significant for hypertension, hyperlipidemia, paroxysmal atrial flutter, ventricular tachycardia, remote tobacco and ethanol abuse, emphysema, possible ILD, and multiple orthopedic operations. He has been having chest "discomfort" with exertion for about 6 months. He says the pain has been more frequent and severe over the past 6 weeks. He is now having symptoms with relatively light activities. He describes it as a discomfort but not pain, pressure or tightness. He says it feels like he can't breathe. It is relieved by rest. He has not had any rest symptoms.  Today he underwent cardiac catheterization which revealed left main disease (75%) and severe 2 vessel CAD involving the LAD and circumflex. The RCA had calcification but no hemodynamically significant stenoses.  Past Medical History  Diagnosis Date  . HYPERLIPIDEMIA 09/29/2006  . ERECTILE DYSFUNCTION 09/29/2006  . 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 ABUSE, ALCOHOL, IN REMISSION 04/08/2007  . Other specified forms of hearing loss 08/10/2009  . HYPERTENSION 09/29/2006  . VENTRICULAR TACHYCARDIA 09/29/2006  . SINUSITIS- ACUTE-NOS 06/23/2007  . ALLERGIC RHINITIS 09/29/2006  . BENIGN PROSTATIC HYPERTROPHY 09/29/2006  . OSTEOARTHROSIS NOS, OTHER [facility] SITE 09/29/2006  . BACK PAIN 09/12/2008  . LEG PAIN, RIGHT 10/02/2008  . VERTIGO 04/08/2007  . COLONIC POLYPS, HX OF 06/23/2007  . CHEST PAIN 07/04/2008  . FATIGUE 11/16/2009  . VIRAL GASTROENTERITIS 01/29/2010  . GLUCOSE INTOLERANCE 03/19/2010  . Atrial flutter ([place]) 03/28/2010    a. s/p prior rfca;  b. recurrent paroxysmal flutter 04/2012;  c. pradaxa initiated 04/2012.  [name] Long term (current) use of anticoagulants 04/26/2010  . Dysrhythmia, cardiac 2012    atrial  flutter  . Diverticulosis   . MELANOMA, MALIGNANT, SKIN NOS 09/29/2006    other skin cancers -no further melanoma  . Hearing loss     hard of hearing    Past Surgical History  Procedure Laterality Date  . Left rotato cuff    . Joint replacement      Right knee  . Tonsillectomy    . Left knee arthroscopy    . Cataract extraction, bilateral Bilateral   . Colonoscopy with propofol N/A 12/08/2014    Procedure: COLONOSCOPY WITH PROPOFOL;  Surgeon: [name], MD;  Location: [facility];  Service: Endoscopy;  Laterality: N/A;    Family History  Problem Relation Age of Onset  . Diabetes Brother   . Esophageal cancer Father   . Colon cancer Neg Hx   . Stomach cancer Neg Hx     Social History:  reports that he has quit smoking. He has never used smokeless tobacco. He reports that he does not drink alcohol or use illicit drugs.  Allergies:  Allergies  Allergen Reactions  . Ace Inhibitors     REACTION: cough  . Amiodarone Hcl     REACTION: intolerance    Medications:  Scheduled: . [START ON 03/13/2015] aspirin  81 mg Oral Daily  . [START ON 03/13/2015] atorvastatin  10 mg Oral Daily  . [START ON 03/13/2015] diltiazem  360 mg Oral Daily  . [START ON 03/13/2015] metoprolol succinate  50 mg Oral Daily  . [START ON 03/13/2015] multivitamin with minerals  1 tablet Oral Daily  . [START ON 03/13/2015] omega-3 acid ethyl esters  1 g  Oral Daily  . psyllium  1 packet Oral Daily  . sodium chloride flush  3 mL Intravenous Q12H    Results for orders placed or performed during the hospital encounter of 03/12/15 (from the past 48 hour(s))  Protime-INR     Status: None   Collection Time: 03/12/15  7:59 AM  Result Value Ref Range   Prothrombin Time 15.2 11.6 - 15.2 seconds   INR 1.18 0.00 - 1.49    No results found.  Review of Systems  Constitutional: Positive for malaise/fatigue. Negative for fever and chills.  Eyes: Negative for blurred vision and double vision.  Respiratory: Positive for  shortness of breath.   Cardiovascular: Positive for chest pain. Negative for orthopnea, claudication and leg swelling.  Gastrointestinal: Negative for nausea and vomiting.  Genitourinary: Positive for frequency. Negative for hematuria.  Musculoskeletal: Positive for back pain and joint pain. Negative for falls.  Neurological: Negative for dizziness, focal weakness and loss of consciousness.  Endo/Heme/Allergies: Does not bruise/bleed easily.  All other systems reviewed and are negative.  Blood pressure 129/63, pulse 62, temperature 98.6 F (37 C), temperature source Oral, resp. rate 14, height 6' (1.829 m), weight 175 lb (79.379 kg), SpO2 99 %. Physical Exam  Vitals reviewed. Constitutional: He is oriented to person, place, and time. He appears well-developed and well-nourished. No distress.  HENT:  Head: Normocephalic and atraumatic.  Eyes: Conjunctivae and EOM are normal. No scleral icterus.  Neck: Normal range of motion. Neck supple. No thyromegaly present.  No bruits  Cardiovascular: Normal rate, regular rhythm, normal heart sounds and intact distal pulses.  Exam reveals no gallop and no friction rub.   No murmur heard. Respiratory: Effort normal and breath sounds normal. No respiratory distress. He has no wheezes. He has no rales.  GI: Soft. Bowel sounds are normal. He exhibits no distension. There is no tenderness.  Musculoskeletal: Normal range of motion. He exhibits no edema.  Lymphadenopathy:    He has no cervical adenopathy.  Neurological: He is oriented to person, place, and time. He exhibits normal muscle tone. Coordination normal.  Skin: Skin is warm and dry.   CARDIAC CATHETERIZATION Conclusion    1. Prox RCA to Dist RCA lesion, 25% stenosed. 2. LM lesion, 75% stenosed. 3. Ost Cx lesion, 99% stenosed. 4. Ost 1st Mrg to 1st Mrg lesion, 75% stenosed. 5. Prox LAD to Mid LAD lesion, 75% stenosed. 6. Ost 1st Diag lesion, 75% stenosed. 7. Ost 2nd Diag to 2nd Diag lesion,  65% stenosed. 8. 3rd Diag lesion, 85% stenosed.   CCS class III angina pectoris and dyspnea.  Calcific coronary artery disease with 75% distal left main, segmental 70-80% mid LAD stenosis, moderate 2 and 3 stenosis, 99% ostial circumflex, 70% proximal obtuse marginal 1, and luminal irregularities throughout a dominant right coronary.  Normal left ventricular systolic function with normal hemodynamics. EF greater than 50%.   RECOMMENDATIONS:   Elderly but fit a 59-year-old gentleman with severe left main, circumflex, and LAD disease. Class III CCS functional status and progressive over the past 6 weeks.  We'll admit to the hospital, place on IV heparin, get TCTS consult to consider revascularization options.     Assessment/Plan: 82 yo man with multiple CRF presents with progressive exertional angina, Class III. At cath he has severe left main and 2 vessel CAD with preserved LV systolic function. CABG is indicated for survival benefit and relief of symptoms.   He has a history of atrial flutter and VT. I think a  left atrial appendage clip would be beneficial as well.  I discussed the general nature of the procedure, including the need for general anesthesia, the use of cardiopulmonary bypass, and the incisions to be used with Mr and Mrs Manetta. We discussed the expected hospital stay, overall recovery and short and long term outcomes. I reviewed the indications, risks, benefits and alternatives with them. They understand the risks include, but are not limited to death, stroke, MI, DVT/PE, bleeding, possible need for transfusion, infections, cardiac arrhythmias, and other organ system dysfunction including respiratory, renal, or GI complications. They understand he is at increased risk for stroke and other complications due to his advanced age.  He accepts the risks and agrees to proceed.  Plan CABG, left atrial clip on Wednesday 03/14/15  [name] 03/12/2015, 5:57 PM

## 2015-03-12 NOTE — H&P (View-Only) (Signed)
Patient Care Team: Biagio Borg, MD as PCP - General   HPI  Jonathan Orozco is a 82 y.o. male Seen in followup for outflow tract ectopy and atrial flutters for which  he underwent EP testing and 3-D mapping which failed to produce stable arrhythmias; medical therapy was undertaken.  He has been intolerant of amiodarone. 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 That has not been history of late.  He is been managed with rate control and anticoagulation with warfarin. Previously he had been on NOACs but was intolerant of Pradaxa because of bloating; he is now on warfarin.    10/16 he ended up with recurrent tachypalpitations and a [place] was found to be in atypical atrial flutter and was cardioverted  ER records labs and ECG were reviewed  Earlier this week he re-presented to the emergency room and tachycardia. He was cardioverted to sinus rhythm and discharged.   He continues to complain of exercise intolerance. It is characterized by shortness of breath chest tightness and a need to stop. Low-level exercise treadmill is associated with reproducible discomfort but not as limiting. He had a catheterization remotely according to him and his wife about 15 years ago. He has had no interval assessment of coronary perfusion.   He is known to have sinus bradycardia with presumed poor heart rate excursion  Last echo 2010 normal LV function  Past Medical History  Diagnosis Date  . HYPERLIPIDEMIA 09/29/2006  . ERECTILE DYSFUNCTION 09/29/2006  . 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 ABUSE, ALCOHOL, IN REMISSION 04/08/2007  . Other specified forms of hearing loss 08/10/2009  . HYPERTENSION 09/29/2006  . VENTRICULAR TACHYCARDIA 09/29/2006  . SINUSITIS- ACUTE-NOS 06/23/2007  . ALLERGIC RHINITIS 09/29/2006  . BENIGN PROSTATIC HYPERTROPHY 09/29/2006  . OSTEOARTHROSIS NOS, OTHER [facility] SITE 09/29/2006  . BACK PAIN 09/12/2008  . LEG  PAIN, RIGHT 10/02/2008  . VERTIGO 04/08/2007  . COLONIC POLYPS, HX OF 06/23/2007  . CHEST PAIN 07/04/2008  . FATIGUE 11/16/2009  . VIRAL GASTROENTERITIS 01/29/2010  . GLUCOSE INTOLERANCE 03/19/2010  . Atrial flutter ([place]) 03/28/2010    a. s/p prior rfca;  b. recurrent paroxysmal flutter 04/2012;  c. pradaxa initiated 04/2012.  [name] Long term (current) use of anticoagulants 04/26/2010  . Dysrhythmia, cardiac 2012    atrial flutter  . Diverticulosis   . MELANOMA, MALIGNANT, SKIN NOS 09/29/2006    other skin cancers -no further melanoma  . Hearing loss     hard of hearing    Past Surgical History  Procedure Laterality Date  . Left rotato cuff    . Joint replacement      Right knee  . Tonsillectomy    . Left knee arthroscopy    . Cataract extraction, bilateral Bilateral   . Colonoscopy with propofol N/A 12/08/2014    Procedure: COLONOSCOPY WITH PROPOFOL;  Surgeon: [name], MD;  Location: [facility];  Service: Endoscopy;  Laterality: N/A;    Current Outpatient Prescriptions  Medication Sig Dispense Refill  . ALPRAZolam (XANAX) 0.5 MG tablet Take 1 tablet (0.5 mg total) by mouth daily as needed for anxiety. 30 tablet 2  . atorvastatin (LIPITOR) 10 MG tablet Take 1 tablet (10 mg total) by mouth daily. 90 tablet 3  . diltiazem (MATZIM LA) 360 MG 24 hr tablet Take 1 tablet (360 mg total) by mouth daily. 90 tablet 3  . fish oil-omega-3 fatty acids 1000 MG capsule Take 1 g  by mouth daily.     [name] HYDROcodone-acetaminophen (NORCO/VICODIN) 5-325 MG tablet Take 1 tablet by mouth every 6 (six) hours as needed for moderate pain (body pain).    . metoprolol succinate (TOPROL-XL) 100 MG 24 hr tablet Take 50 mg by mouth daily. Take with or immediately following a meal.    . Multiple Vitamin (MULTIVITAMIN) tablet Take 1 tablet by mouth daily.      . Probiotic Product (RESTORA) CAPS Take 1 capsule by mouth daily. 10 capsule 0  . psyllium (METAMUCIL) 58.6 % packet Take 1 packet by mouth daily.    [name] warfarin  (COUMADIN) 5 MG tablet Take 1-1 1/2 tablets daily as directed by anticoagulation clinic (Patient taking differently: Take 5-7.5 mg by mouth daily. Take 5 mg by mouth daily Tue, Thurs, and Sat. Take 7.5 mg by mouth on all other days.) 135 tablet 1   No current facility-administered medications for this visit.    Allergies  Allergen Reactions  . Ace Inhibitors     REACTION: cough  . Amiodarone Hcl     REACTION: intolerance    Review of Systems negative except from HPI and PMH  Physical Exam BP 126/72 mmHg  Pulse 62  Ht 6' (1.829 m)  Wt 179 lb 2 oz (81.251 kg)  BMI 24.29 kg/m2 Well developed and nourished in no acute distress HENT normal Neck supple with JVP-flat Clear Regular rate and rhythm, no murmurs or gallops Abd-soft with active BS No Clubbing cyanosis edema Skin-warm and dry A & Oriented  Grossly normal sensory and motor function   ECG demonstrates sinus rhythm  at 62 Intervals 20/14/45 Axis XLVI    Assessment and  Plan  Atrial fibrillation/flutter paroxysmal   Sinus bradycardia  Hypertension  Angina pectoris    He is having crescendo angina. I have reviewed his story with Dr. Jeralene Peters Smith's. We will plan to proceed with catheterization. His pretest probabilities are sufficiently high and a negative Myoview would be insufficiently assuring. He was out of rhythm for less than 24 hours prior to his cardioversion over the weekend. Hence, we will discontinue the Coumadin. I will start one aspirin concurrently.

## 2015-03-12 NOTE — Progress Notes (Signed)
TR BAND REMOVAL  LOCATION:    Right radial  DEFLATED PER PROTOCOL:    Yes.    TIME BAND OFF / DRESSING APPLIED:    1230p   SITE UPON ARRIVAL:    Level 0  SITE AFTER BAND REMOVAL:    Level 0  CIRCULATION SENSATION AND MOVEMENT:    Within Normal Limits   Yes.    COMMENTS:   Pt denies any discomfort at this time qt radial site

## 2015-03-12 NOTE — Progress Notes (Addendum)
ANTICOAGULATION CONSULT NOTE - Initial Consult  Pharmacy Consult for Heparin Indication: aflutter and 3VCAD  Allergies  Allergen Reactions  . Ace Inhibitors     REACTION: cough  . Amiodarone Hcl     REACTION: intolerance    Patient Measurements: Height: 6' (182.9 cm) Weight: 175 lb (79.379 kg) IBW/kg (Calculated) : 77.6 Heparin Dosing Weight: 79.4 kg  Vital Signs: Temp: 98.6 F (37 C) (02/06 1458) Temp Source: Oral (02/06 1458) BP: 129/63 mmHg (02/06 1458) Pulse Rate: 62 (02/06 1458)  Labs:  Recent Labs  03/12/15 0759  LABPROT 15.2  INR 1.18    Estimated Creatinine Clearance: 69.7 mL/min (by C-G formula based on Cr of 0.85).   Medical History: Past Medical History  Diagnosis Date  . HYPERLIPIDEMIA 09/29/2006  . ERECTILE DYSFUNCTION 09/29/2006  . 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 ABUSE, ALCOHOL, IN REMISSION 04/08/2007  . Other specified forms of hearing loss 08/10/2009  . HYPERTENSION 09/29/2006  . VENTRICULAR TACHYCARDIA 09/29/2006  . SINUSITIS- ACUTE-NOS 06/23/2007  . ALLERGIC RHINITIS 09/29/2006  . BENIGN PROSTATIC HYPERTROPHY 09/29/2006  . OSTEOARTHROSIS NOS, OTHER [facility] SITE 09/29/2006  . BACK PAIN 09/12/2008  . LEG PAIN, RIGHT 10/02/2008  . VERTIGO 04/08/2007  . COLONIC POLYPS, HX OF 06/23/2007  . CHEST PAIN 07/04/2008  . FATIGUE 11/16/2009  . VIRAL GASTROENTERITIS 01/29/2010  . GLUCOSE INTOLERANCE 03/19/2010  . Atrial flutter ([place]) 03/28/2010    a. s/p prior rfca;  b. recurrent paroxysmal flutter 04/2012;  c. pradaxa initiated 04/2012.  [name] Long term (current) use of anticoagulants 04/26/2010  . Dysrhythmia, cardiac 2012    atrial flutter  . Diverticulosis   . MELANOMA, MALIGNANT, SKIN NOS 09/29/2006    other skin cancers -no further melanoma  . Hearing loss     hard of hearing    Medications:  Prescriptions prior to admission  Medication Sig Dispense Refill Last Dose  . ALPRAZolam (XANAX) 0.5 MG tablet Take 1 tablet (0.5 mg total) by mouth daily as  needed for anxiety. 30 tablet 2 03/11/2015 at 2000  . atorvastatin (LIPITOR) 10 MG tablet Take 1 tablet (10 mg total) by mouth daily. 90 tablet 3 03/12/2015 at 0630  . diltiazem (MATZIM LA) 360 MG 24 hr tablet Take 1 tablet (360 mg total) by mouth daily. 90 tablet 3 03/12/2015 at 0630  . fish oil-omega-3 fatty acids 1000 MG capsule Take 1 g by mouth daily.    03/12/2015 at 0630  . HYDROcodone-acetaminophen (NORCO/VICODIN) 5-325 MG tablet Take 1 tablet by mouth every 6 (six) hours as needed for moderate pain (body pain).   Past Week at Unknown time  . metoprolol succinate (TOPROL-XL) 100 MG 24 hr tablet Take 50 mg by mouth daily. Take with or immediately following a meal.   03/12/2015 at 0630  . Multiple Vitamin (MULTIVITAMIN) tablet Take 1 tablet by mouth daily.     03/12/2015 at 0630  . Probiotic Product (RESTORA) CAPS Take 1 capsule by mouth daily. 10 capsule 0 03/12/2015 at 0630  . psyllium (METAMUCIL) 58.6 % packet Take 1 packet by mouth daily.   03/11/2015 at Unknown time  . warfarin (COUMADIN) 5 MG tablet Take 1-1 1/2 tablets daily as directed by anticoagulation clinic (Patient taking differently: Take 5-7.5 mg by mouth daily. Take 5 mg by mouth daily Tue, Thurs, and Sat. Take 7.5 mg by mouth on all other days.) 135 tablet 1 03/07/2015 at 0900    Assessment: 82 y/o M with aflutter on Coumadin PTA. Last dose 2/1. Significant  PMH.  2/6: Cath with severe LM, circumflex, and LAD dz  Anticoagulation: Aflutter + 3VCAD s/p cath 2/6. TR band off 1230.  Goal of Therapy:  Heparin level 0.3-0.7 units/ml Monitor platelets by anticoagulation protocol: Yes   Plan:  Plan: Start IV heparin (no bolus) at 1050 units/hr at 2030 post-cath Check heparin level and CBC 6 hrs later Daily HL and CBC   Rachael Zapanta S. Alford Highland, PharmD, BCPS Clinical Staff Pharmacist Pager [phone]  Eilene Ghazi Stillinger 03/12/2015,3:36 PM

## 2015-03-13 ENCOUNTER — Ambulatory Visit (HOSPITAL_COMMUNITY): Payer: Medicare Other

## 2015-03-13 ENCOUNTER — Encounter (HOSPITAL_COMMUNITY): Payer: Self-pay | Admitting: Interventional Cardiology

## 2015-03-13 ENCOUNTER — Inpatient Hospital Stay (HOSPITAL_COMMUNITY): Payer: Medicare Other

## 2015-03-13 DIAGNOSIS — I2510 Atherosclerotic heart disease of native coronary artery without angina pectoris: Secondary | ICD-10-CM

## 2015-03-13 DIAGNOSIS — I4892 Unspecified atrial flutter: Secondary | ICD-10-CM

## 2015-03-13 LAB — PULMONARY FUNCTION TEST
DL/VA % pred: 74 %
DL/VA: 3.48 ml/min/mmHg/L
DLCO UNC % PRED: 64 %
DLCO cor % pred: 67 %
DLCO cor: 23.55 ml/min/mmHg
DLCO unc: 22.71 ml/min/mmHg
FEF 25-75 Post: 4.2 L/sec
FEF 25-75 Pre: 3.24 L/sec
FEF2575-%CHANGE-POST: 29 %
FEF2575-%PRED-POST: 219 %
FEF2575-%Pred-Pre: 169 %
FEV1-%CHANGE-POST: 5 %
FEV1-%PRED-POST: 125 %
FEV1-%PRED-PRE: 118 %
FEV1-POST: 3.64 L
FEV1-PRE: 3.45 L
FEV1FVC-%CHANGE-POST: 4 %
FEV1FVC-%Pred-Pre: 111 %
FEV6-%Change-Post: 1 %
FEV6-%PRED-PRE: 113 %
FEV6-%Pred-Post: 115 %
FEV6-PRE: 4.39 L
FEV6-Post: 4.47 L
FEV6FVC-%Change-Post: 0 %
FEV6FVC-%PRED-PRE: 106 %
FEV6FVC-%Pred-Post: 107 %
FVC-%CHANGE-POST: 1 %
FVC-%PRED-PRE: 106 %
FVC-%Pred-Post: 108 %
FVC-POST: 4.49 L
FVC-PRE: 4.42 L
POST FEV1/FVC RATIO: 81 %
POST FEV6/FVC RATIO: 100 %
PRE FEV6/FVC RATIO: 99 %
Pre FEV1/FVC ratio: 78 %
RV % PRED: 129 %
RV: 3.72 L
TLC % pred: 108 %
TLC: 8.09 L

## 2015-03-13 LAB — BLOOD GAS, ARTERIAL
Acid-Base Excess: 2 mmol/L (ref 0.0–2.0)
Bicarbonate: 25.9 mEq/L — ABNORMAL HIGH (ref 20.0–24.0)
Drawn by: 398981
FIO2: 0.21
O2 Saturation: 94.1 %
Patient temperature: 98.6
TCO2: 27.1 mmol/L (ref 0–100)
pCO2 arterial: 38.9 mmHg (ref 35.0–45.0)
pH, Arterial: 7.438 (ref 7.350–7.450)
pO2, Arterial: 68.4 mmHg — ABNORMAL LOW (ref 80.0–100.0)

## 2015-03-13 LAB — CBC
HCT: 40.3 % (ref 39.0–52.0)
HEMOGLOBIN: 13.4 g/dL (ref 13.0–17.0)
MCH: 30.9 pg (ref 26.0–34.0)
MCHC: 33.3 g/dL (ref 30.0–36.0)
MCV: 92.9 fL (ref 78.0–100.0)
PLATELETS: 200 10*3/uL (ref 150–400)
RBC: 4.34 MIL/uL (ref 4.22–5.81)
RDW: 12.9 % (ref 11.5–15.5)
WBC: 7 10*3/uL (ref 4.0–10.5)

## 2015-03-13 LAB — PREPARE RBC (CROSSMATCH)

## 2015-03-13 LAB — ABO/RH: ABO/RH(D): O POS

## 2015-03-13 LAB — HEPARIN LEVEL (UNFRACTIONATED)
HEPARIN UNFRACTIONATED: 0.2 [IU]/mL — AB (ref 0.30–0.70)
HEPARIN UNFRACTIONATED: 0.5 [IU]/mL (ref 0.30–0.70)

## 2015-03-13 MED ORDER — EPINEPHRINE HCL 1 MG/ML IJ SOLN
0.0000 ug/min | INTRAVENOUS | Status: DC
  Filled 2015-03-13: qty 4

## 2015-03-13 MED ORDER — DOPAMINE-DEXTROSE 3.2-5 MG/ML-% IV SOLN
0.0000 ug/kg/min | INTRAVENOUS | Status: DC
  Filled 2015-03-13: qty 250

## 2015-03-13 MED ORDER — SODIUM CHLORIDE 0.9 % IV SOLN
INTRAVENOUS | Status: DC
  Administered 2015-03-14: .9 [IU]/h via INTRAVENOUS
  Filled 2015-03-13: qty 2.5

## 2015-03-13 MED ORDER — CHLORHEXIDINE GLUCONATE 0.12 % MT SOLN: 15.0000 mL | Freq: Once | OROMUCOSAL | Status: DC

## 2015-03-13 MED ORDER — BISACODYL 5 MG PO TBEC
5.0000 mg | DELAYED_RELEASE_TABLET | Freq: Once | ORAL | Status: AC
  Administered 2015-03-13: 5 mg via ORAL
  Filled 2015-03-13: qty 1

## 2015-03-13 MED ORDER — ~~LOC~~ CARDIAC SURGERY, PATIENT & FAMILY EDUCATION
Freq: Once | Status: AC
  Administered 2015-03-13: 22:00:00
  Filled 2015-03-13: qty 1

## 2015-03-13 MED ORDER — SODIUM CHLORIDE 0.9 % IV SOLN
INTRAVENOUS | Status: DC
  Filled 2015-03-13: qty 30

## 2015-03-13 MED ORDER — DEXMEDETOMIDINE HCL IN NACL 400 MCG/100ML IV SOLN
0.1000 ug/kg/h | INTRAVENOUS | Status: DC
  Administered 2015-03-14: 0.7 ug/kg/h via INTRAVENOUS
  Filled 2015-03-13: qty 100

## 2015-03-13 MED ORDER — VANCOMYCIN HCL 10 G IV SOLR
1250.0000 mg | INTRAVENOUS | Status: DC
  Administered 2015-03-14: 1250 mg via INTRAVENOUS
  Filled 2015-03-13: qty 1250

## 2015-03-13 MED ORDER — ALBUTEROL SULFATE (2.5 MG/3ML) 0.083% IN NEBU
2.5000 mg | INHALATION_SOLUTION | Freq: Once | RESPIRATORY_TRACT | Status: AC
  Administered 2015-03-13: 2.5 mg via RESPIRATORY_TRACT

## 2015-03-13 MED ORDER — DEXTROSE 5 % IV SOLN
750.0000 mg | INTRAVENOUS | Status: DC
  Filled 2015-03-13: qty 750

## 2015-03-13 MED ORDER — METOPROLOL TARTRATE 12.5 MG HALF TABLET: 12.5000 mg | ORAL_TABLET | Freq: Once | ORAL | Status: DC

## 2015-03-13 MED ORDER — PLASMA-LYTE 148 IV SOLN
INTRAVENOUS | Status: DC
  Filled 2015-03-13: qty 2.5

## 2015-03-13 MED ORDER — NITROGLYCERIN IN D5W 200-5 MCG/ML-% IV SOLN
2.0000 ug/min | INTRAVENOUS | Status: DC
  Administered 2015-03-14: 16 ug/min via INTRAVENOUS
  Filled 2015-03-13: qty 250

## 2015-03-13 MED ORDER — CEFUROXIME SODIUM 1.5 G IJ SOLR
1.5000 g | INTRAMUSCULAR | Status: DC
  Administered 2015-03-14: .75 g via INTRAVENOUS
  Administered 2015-03-14: 1.5 g via INTRAVENOUS
  Filled 2015-03-13: qty 1.5

## 2015-03-13 MED ORDER — PHENYLEPHRINE HCL 10 MG/ML IJ SOLN
30.0000 ug/min | INTRAMUSCULAR | Status: DC
  Administered 2015-03-14: 55 ug/min via INTRAVENOUS
  Filled 2015-03-13: qty 2

## 2015-03-13 MED ORDER — CHLORHEXIDINE GLUCONATE 4 % EX LIQD
60.0000 mL | Freq: Once | CUTANEOUS | Status: AC
  Administered 2015-03-13: 4 via TOPICAL
  Filled 2015-03-13: qty 15

## 2015-03-13 MED ORDER — POTASSIUM CHLORIDE 2 MEQ/ML IV SOLN
80.0000 meq | INTRAVENOUS | Status: DC
  Filled 2015-03-13: qty 40

## 2015-03-13 MED ORDER — TEMAZEPAM 15 MG PO CAPS: 15.0000 mg | ORAL_CAPSULE | Freq: Once | ORAL | Status: DC | PRN

## 2015-03-13 MED ORDER — MAGNESIUM SULFATE 50 % IJ SOLN
40.0000 meq | INTRAMUSCULAR | Status: DC
Start: 2015-03-14 — End: 2015-03-14
  Filled 2015-03-13: qty 10

## 2015-03-13 MED ORDER — SODIUM CHLORIDE 0.9 % IV SOLN
INTRAVENOUS | Status: DC
  Administered 2015-03-14: 69 mL/h via INTRAVENOUS
  Filled 2015-03-13: qty 40

## 2015-03-13 MED ORDER — CHLORHEXIDINE GLUCONATE 4 % EX LIQD
60.0000 mL | Freq: Once | CUTANEOUS | Status: DC
  Filled 2015-03-13: qty 60

## 2015-03-13 MED FILL — Nitroglycerin IV Soln 100 MCG/ML in D5W: INTRA_ARTERIAL | Qty: 10 | Status: AC

## 2015-03-13 NOTE — Progress Notes (Signed)
Utilization review completed. Rian Busche, RN, BSN. 

## 2015-03-13 NOTE — Progress Notes (Signed)
CARDIAC REHAB PHASE I   Pre-op education completed with pt and wife at bedside. Reviewed IS, sternal precautions, activity progression, cardiac surgery booklet and cardiac surgery guidelines. Reviewed instructions to view cardiac surgery videos. Pt and wife verbalized understanding, receptive to education. Pt in recliner, call bell within reach. Will follow post-op.   [id number] Lenna Sciara, RN, BSN 03/13/2015 11:46 AM

## 2015-03-13 NOTE — Progress Notes (Signed)
    Subjective:  Denies CP or dyspnea   Objective:  Filed Vitals:   03/12/15 1405 03/12/15 1458 03/12/15 2013 03/13/15 0622  BP: 103/58 129/63 117/66 113/62  Pulse: 52 62 63 58  Temp:  98.6 F (37 C) 98.1 F (36.7 C) 98.4 F (36.9 C)  TempSrc:  Oral Oral Oral  Resp: 14 14 18 18   Height:      Weight:      SpO2: 97% 99% 97% 98%    Intake/Output from previous day:  Intake/Output Summary (Last 24 hours) at 03/13/15 1011 Last data filed at 03/13/15 0800  Gross per 24 hour  Intake  826.5 ml  Output   3950 ml  Net -3123.5 ml    Physical Exam: Physical exam: Well-developed well-nourished in no acute distress.  Skin is warm and dry.  HEENT is normal.  Neck is supple. Chest is clear to auscultation with normal expansion.  Cardiovascular exam is regular rate and rhythm.  Abdominal exam nontender or distended. No masses palpated. Extremities show no edema. Right radial Site with no hematoma. neuro grossly intact    Lab Results: CBC:  Recent Labs  03/13/15 0412  WBC 7.0  HGB 13.4  HCT 40.3  MCV 92.9  PLT 200     Assessment/Plan:  1 coronary artery disease-continue aspirin, statin and heparin. Continue metoprolol. Plan for coronary artery bypass graft on February 8. 2 history of atrial flutter-Patient remains in sinus rhythm. Continue metoprolol and heparin. 3 history of ventricular tachycardia-continue beta blocker. 4 Hypertension-pressure controlled. Continue present medications.  Kirk Ruths 03/13/2015, 10:11 AM

## 2015-03-13 NOTE — Progress Notes (Signed)
[place] for Heparin Indication: aflutter and 3VCAD  Allergies  Allergen Reactions  . Ace Inhibitors     REACTION: cough  . Amiodarone Hcl     REACTION: intolerance    Patient Measurements: Height: 6' (182.9 cm) Weight: 175 lb (79.379 kg) IBW/kg (Calculated) : 77.6 Heparin Dosing Weight: 79.4 kg  Vital Signs: Temp: 98.4 F (36.9 C) (02/07 0622) Temp Source: Oral (02/07 0622) BP: 113/62 mmHg (02/07 0622) Pulse Rate: 58 (02/07 0622)  Labs:  Recent Labs  03/12/15 0759 03/13/15 0412 03/13/15 0509  HGB  --  13.4  --   HCT  --  40.3  --   PLT  --  200  --   LABPROT 15.2  --   --   INR 1.18  --   --   HEPARINUNFRC  --   --  0.20*    Estimated Creatinine Clearance: 69.7 mL/min (by C-G formula based on Cr of 0.85).  Assessment: 82 y/o Male with h/o aflutter, Coumadin on hold for CABG tomorrow, for heparin  Goal of Therapy:  Heparin level 0.3-0.7 units/ml Monitor platelets by anticoagulation protocol: Yes   Plan:  Increase Heparin 1250 units/hr Check heparin level in 8 hours.  Yaneth Fairbairn, Bronson Curb 03/13/2015,6:38 AM

## 2015-03-13 NOTE — Anesthesia Preprocedure Evaluation (Addendum)
Anesthesia Evaluation  Patient identified by MRN, date of birth, ID band Patient awake    Reviewed: Allergy & Precautions, NPO status , Patient's Chart, lab work & pertinent test results  Airway Mallampati: II  TM Distance: >3 FB     Dental  (+) Edentulous Upper, Edentulous Lower   Pulmonary former smoker,    breath sounds clear to auscultation       Cardiovascular hypertension,  Rhythm:Regular Rate:Normal     Neuro/Psych    GI/Hepatic   Endo/Other    Renal/GU      Musculoskeletal   Abdominal   Peds  Hematology   Anesthesia Other Findings   Reproductive/Obstetrics                           Anesthesia Physical Anesthesia Plan  ASA: IV  Anesthesia Plan: General   Post-op Pain Management:    Induction: Intravenous  Airway Management Planned: Oral ETT  Additional Equipment: Arterial line, CVP, PA Cath and 3D TEE  Intra-op Plan:   Post-operative Plan:   Informed Consent: I have reviewed the patients History and Physical, chart, labs and discussed the procedure including the risks, benefits and alternatives for the proposed anesthesia with the patient or authorized representative who has indicated his/her understanding and acceptance.     Plan Discussed with: CRNA and Anesthesiologist  Anesthesia Plan Comments:         Anesthesia Quick Evaluation

## 2015-03-13 NOTE — Progress Notes (Signed)
ANTICOAGULATION CONSULT NOTE - Follow Up Consult  Pharmacy Consult for Heparin Indication: CAD  Allergies  Allergen Reactions  . Ace Inhibitors     REACTION: cough  . Amiodarone Hcl     REACTION: intolerance    Patient Measurements: Height: 6' (182.9 cm) Weight: 175 lb (79.379 kg) IBW/kg (Calculated) : 77.6 Heparin Dosing Weight: 79.4 kg  Vital Signs: Temp: 97.7 F (36.5 C) (02/07 1334) Temp Source: Oral (02/07 1334) BP: 121/61 mmHg (02/07 1334) Pulse Rate: 65 (02/07 1334)  Labs:  Recent Labs  03/12/15 0759 03/13/15 0412 03/13/15 0509 03/13/15 1455  HGB  --  13.4  --   --   HCT  --  40.3  --   --   PLT  --  200  --   --   LABPROT 15.2  --   --   --   INR 1.18  --   --   --   HEPARINUNFRC  --   --  0.20* 0.50    Estimated Creatinine Clearance: 69.7 mL/min (by C-G formula based on Cr of 0.85).  Assessment: Anticoagulation: Aflutter + 3VCAD s/p cath 2/6. TR band off 1230 on 2/7. Heparin level 0.2>0.5 now in goal range.CBC WNL  Goal of Therapy:  Heparin level 0.3-0.7 units/ml Monitor platelets by anticoagulation protocol: Yes   Plan:  Continue IV heparin at 1250 units/hr CABG 2/8 + L atrial clip.   Rebekkah Powless S. Alford Highland, PharmD, BCPS Clinical Staff Pharmacist Pager [phone]  Eilene Ghazi Stillinger 03/13/2015,4:02 PM

## 2015-03-13 NOTE — Progress Notes (Signed)
Pre-op Cardiac Surgery  Carotid Findings:  Findings consistent with1- 39 percent stenosis involving the right internal carotid artery and the left internal carotid artery. Vertebral artery flow is antegrade.  Upper Extremity Right Left  Brachial Pressures Triphasic Triphasic  Radial Waveforms Triphasic Triphasic  Ulnar Waveforms Triphasic Triphasic  Palmar Arch (Allen's Test) Normal Normal   Findings:   [place] Arch evaluation-Doppler waveforms remained normal with both radial and ulnar compressions bilaterally.   Lower  Extremity Right Left  Dorsalis Pedis Triphasic Triphasic  Posterior Tibial Triphasic Triphasic    Findings:  Bilaterally Doppler waveforms indicate (triphasic) within normal limits at rest.

## 2015-03-14 ENCOUNTER — Encounter (HOSPITAL_COMMUNITY)
Admission: AD | Disposition: A | Discharge status: Home Health | Payer: Self-pay | Source: Ambulatory Visit | Attending: Thoracic Surgery (Cardiothoracic Vascular Surgery)

## 2015-03-14 ENCOUNTER — Inpatient Hospital Stay (HOSPITAL_COMMUNITY): Payer: Medicare Other

## 2015-03-14 ENCOUNTER — Inpatient Hospital Stay (HOSPITAL_COMMUNITY): Payer: Medicare Other | Admitting: Anesthesiology

## 2015-03-14 ENCOUNTER — Ambulatory Visit (HOSPITAL_COMMUNITY): Payer: Medicare Other

## 2015-03-14 DIAGNOSIS — I25110 Atherosclerotic heart disease of native coronary artery with unstable angina pectoris: Secondary | ICD-10-CM

## 2015-03-14 DIAGNOSIS — I2510 Atherosclerotic heart disease of native coronary artery without angina pectoris: Secondary | ICD-10-CM | POA: Diagnosis present

## 2015-03-14 HISTORY — PX: TEE WITHOUT CARDIOVERSION: SHX5443

## 2015-03-14 HISTORY — PX: CLIPPING OF ATRIAL APPENDAGE: SHX5773

## 2015-03-14 HISTORY — PX: CORONARY ARTERY BYPASS GRAFT: SHX141

## 2015-03-14 LAB — SURGICAL PCR SCREEN
MRSA, PCR: NEGATIVE
Staphylococcus aureus: NEGATIVE

## 2015-03-14 LAB — BASIC METABOLIC PANEL
Anion gap: 7 (ref 5–15)
BUN: 8 mg/dL (ref 6–20)
CO2: 28 mmol/L (ref 22–32)
Calcium: 8.8 mg/dL — ABNORMAL LOW (ref 8.9–10.3)
Chloride: 103 mmol/L (ref 101–111)
Creatinine, Ser: 0.78 mg/dL (ref 0.61–1.24)
GFR calc Af Amer: >60 mL/min (ref >60)
GFR calc non Af Amer: >60 mL/min (ref >60)
Glucose, Bld: 96 mg/dL (ref 65–99)
Potassium: 3.9 mmol/L (ref 3.5–5.1)
Sodium: 138 mmol/L (ref 135–145)

## 2015-03-14 LAB — POCT I-STAT, CHEM 8
BUN: 8 mg/dL (ref 6–20)
BUN: 8 mg/dL (ref 6–20)
BUN: 6 mg/dL (ref 6–20)
BUN: 7 mg/dL (ref 6–20)
BUN: 7 mg/dL (ref 6–20)
BUN: 7 mg/dL (ref 6–20)
CALCIUM ION: 1.18 mmol/L (ref 1.13–1.30)
CALCIUM ION: 1.02 mmol/L — AB (ref 1.13–1.30)
CALCIUM ION: 1.1 mmol/L — AB (ref 1.13–1.30)
CHLORIDE: 102 mmol/L (ref 101–111)
CHLORIDE: 98 mmol/L — AB (ref 101–111)
CHLORIDE: 100 mmol/L — AB (ref 101–111)
CHLORIDE: 98 mmol/L — AB (ref 101–111)
CHLORIDE: 103 mmol/L (ref 101–111)
CREATININE: 0.6 mg/dL — AB (ref 0.61–1.24)
CREATININE: 0.6 mg/dL — AB (ref 0.61–1.24)
CREATININE: 0.5 mg/dL — AB (ref 0.61–1.24)
CREATININE: 0.5 mg/dL — AB (ref 0.61–1.24)
Calcium, Ion: 1.21 mmol/L (ref 1.13–1.30)
Calcium, Ion: 0.97 mmol/L — ABNORMAL LOW (ref 1.13–1.30)
Calcium, Ion: 1.03 mmol/L — ABNORMAL LOW (ref 1.13–1.30)
Chloride: 100 mmol/L — ABNORMAL LOW (ref 101–111)
Creatinine, Ser: 0.2 mg/dL — ABNORMAL LOW (ref 0.61–1.24)
Creatinine, Ser: 0.6 mg/dL — ABNORMAL LOW (ref 0.61–1.24)
GLUCOSE: 107 mg/dL — AB (ref 65–99)
GLUCOSE: 110 mg/dL — AB (ref 65–99)
GLUCOSE: 97 mg/dL (ref 65–99)
GLUCOSE: 110 mg/dL — AB (ref 65–99)
GLUCOSE: 132 mg/dL — AB (ref 65–99)
GLUCOSE: 173 mg/dL — AB (ref 65–99)
HCT: 40 % (ref 39.0–52.0)
HCT: 37 % — ABNORMAL LOW (ref 39.0–52.0)
HCT: 30 % — ABNORMAL LOW (ref 39.0–52.0)
HCT: 26 % — ABNORMAL LOW (ref 39.0–52.0)
HCT: 26 % — ABNORMAL LOW (ref 39.0–52.0)
HCT: 29 % — ABNORMAL LOW (ref 39.0–52.0)
HEMOGLOBIN: 12.6 g/dL — AB (ref 13.0–17.0)
Hemoglobin: 13.6 g/dL (ref 13.0–17.0)
Hemoglobin: 10.2 g/dL — ABNORMAL LOW (ref 13.0–17.0)
Hemoglobin: 8.8 g/dL — ABNORMAL LOW (ref 13.0–17.0)
Hemoglobin: 8.8 g/dL — ABNORMAL LOW (ref 13.0–17.0)
Hemoglobin: 9.9 g/dL — ABNORMAL LOW (ref 13.0–17.0)
POTASSIUM: 3.6 mmol/L (ref 3.5–5.1)
POTASSIUM: 3.7 mmol/L (ref 3.5–5.1)
POTASSIUM: 4.1 mmol/L (ref 3.5–5.1)
POTASSIUM: 4.5 mmol/L (ref 3.5–5.1)
Potassium: 4.1 mmol/L (ref 3.5–5.1)
Potassium: 4.3 mmol/L (ref 3.5–5.1)
SODIUM: 137 mmol/L (ref 135–145)
Sodium: 139 mmol/L (ref 135–145)
Sodium: 139 mmol/L (ref 135–145)
Sodium: 139 mmol/L (ref 135–145)
Sodium: 133 mmol/L — ABNORMAL LOW (ref 135–145)
Sodium: 137 mmol/L (ref 135–145)
TCO2: 26 mmol/L (ref 0–100)
TCO2: 35 mmol/L (ref 0–100)
TCO2: 31 mmol/L (ref 0–100)
TCO2: 26 mmol/L (ref 0–100)
TCO2: 29 mmol/L (ref 0–100)
TCO2: 23 mmol/L (ref 0–100)

## 2015-03-14 LAB — URINALYSIS, ROUTINE W REFLEX MICROSCOPIC
Bilirubin Urine: NEGATIVE
Glucose, UA: NEGATIVE mg/dL
Ketones, ur: NEGATIVE mg/dL
Leukocytes, UA: NEGATIVE
Nitrite: NEGATIVE
Protein, ur: NEGATIVE mg/dL
Specific Gravity, Urine: 1.004 — ABNORMAL LOW (ref 1.005–1.030)
pH: 7 (ref 5.0–8.0)

## 2015-03-14 LAB — POCT I-STAT 3, ART BLOOD GAS (G3+)
ACID-BASE DEFICIT: 3 mmol/L — AB (ref 0.0–2.0)
ACID-BASE DEFICIT: 2 mmol/L (ref 0.0–2.0)
ACID-BASE EXCESS: 3 mmol/L — AB (ref 0.0–2.0)
Acid-Base Excess: 6 mmol/L — ABNORMAL HIGH (ref 0.0–2.0)
Acid-Base Excess: 3 mmol/L — ABNORMAL HIGH (ref 0.0–2.0)
BICARBONATE: 27.3 meq/L — AB (ref 20.0–24.0)
BICARBONATE: 30 meq/L — AB (ref 20.0–24.0)
Bicarbonate: 26 mEq/L — ABNORMAL HIGH (ref 20.0–24.0)
Bicarbonate: 23 mEq/L (ref 20.0–24.0)
Bicarbonate: 22.8 mEq/L (ref 20.0–24.0)
O2 SAT: 100 %
O2 SAT: 100 %
O2 SAT: 97 %
O2 SAT: 96 %
O2 Saturation: 100 %
PCO2 ART: 40.6 mmHg (ref 35.0–45.0)
PCO2 ART: 40.3 mmHg (ref 35.0–45.0)
PH ART: 7.477 — AB (ref 7.350–7.450)
PH ART: 7.326 — AB (ref 7.350–7.450)
PH ART: 7.362 (ref 7.350–7.450)
TCO2: 28 mmol/L (ref 0–100)
TCO2: 31 mmol/L (ref 0–100)
TCO2: 27 mmol/L (ref 0–100)
TCO2: 24 mmol/L (ref 0–100)
TCO2: 24 mmol/L (ref 0–100)
pCO2 arterial: 39.7 mmHg (ref 35.0–45.0)
pCO2 arterial: 34.4 mmHg — ABNORMAL LOW (ref 35.0–45.0)
pCO2 arterial: 44 mmHg (ref 35.0–45.0)
pH, Arterial: 7.445 (ref 7.350–7.450)
pH, Arterial: 7.485 — ABNORMAL HIGH (ref 7.350–7.450)
pO2, Arterial: 255 mmHg — ABNORMAL HIGH (ref 80.0–100.0)
pO2, Arterial: 491 mmHg — ABNORMAL HIGH (ref 80.0–100.0)
pO2, Arterial: 159 mmHg — ABNORMAL HIGH (ref 80.0–100.0)
pO2, Arterial: 94 mmHg (ref 80.0–100.0)
pO2, Arterial: 83 mmHg (ref 80.0–100.0)

## 2015-03-14 LAB — GLUCOSE, CAPILLARY
GLUCOSE-CAPILLARY: 147 mg/dL — AB (ref 65–99)
Glucose-Capillary: 120 mg/dL — ABNORMAL HIGH (ref 65–99)
Glucose-Capillary: 108 mg/dL — ABNORMAL HIGH (ref 65–99)
Glucose-Capillary: 110 mg/dL — ABNORMAL HIGH (ref 65–99)

## 2015-03-14 LAB — PROTIME-INR
INR: 1.46 (ref 0.00–1.49)
Prothrombin Time: 17.8 seconds — ABNORMAL HIGH (ref 11.6–15.2)

## 2015-03-14 LAB — CBC
HCT: 39.7 % (ref 39.0–52.0)
HCT: 33.4 % — ABNORMAL LOW (ref 39.0–52.0)
HCT: 31.3 % — ABNORMAL LOW (ref 39.0–52.0)
HEMOGLOBIN: 13.2 g/dL (ref 13.0–17.0)
HEMOGLOBIN: 10.1 g/dL — AB (ref 13.0–17.0)
Hemoglobin: 11.1 g/dL — ABNORMAL LOW (ref 13.0–17.0)
MCH: 30.8 pg (ref 26.0–34.0)
MCH: 30.6 pg (ref 26.0–34.0)
MCH: 30.1 pg (ref 26.0–34.0)
MCHC: 33.2 g/dL (ref 30.0–36.0)
MCHC: 33.2 g/dL (ref 30.0–36.0)
MCHC: 32.3 g/dL (ref 30.0–36.0)
MCV: 92.5 fL (ref 78.0–100.0)
MCV: 92 fL (ref 78.0–100.0)
MCV: 93.4 fL (ref 78.0–100.0)
PLATELETS: 185 10*3/uL (ref 150–400)
PLATELETS: 129 10*3/uL — AB (ref 150–400)
PLATELETS: 147 10*3/uL — AB (ref 150–400)
RBC: 4.29 MIL/uL (ref 4.22–5.81)
RBC: 3.63 MIL/uL — ABNORMAL LOW (ref 4.22–5.81)
RBC: 3.35 MIL/uL — AB (ref 4.22–5.81)
RDW: 13 % (ref 11.5–15.5)
RDW: 12.8 % (ref 11.5–15.5)
RDW: 13.1 % (ref 11.5–15.5)
WBC: 6.2 10*3/uL (ref 4.0–10.5)
WBC: 8.4 10*3/uL (ref 4.0–10.5)
WBC: 10 10*3/uL (ref 4.0–10.5)

## 2015-03-14 LAB — CREATININE, SERUM: Creatinine, Ser: 0.8 mg/dL (ref 0.61–1.24)

## 2015-03-14 LAB — URINE MICROSCOPIC-ADD ON: WBC, UA: NONE SEEN WBC/hpf (ref 0–5)

## 2015-03-14 LAB — PLATELET COUNT: PLATELETS: 138 10*3/uL — AB (ref 150–400)

## 2015-03-14 LAB — HEPARIN LEVEL (UNFRACTIONATED): HEPARIN UNFRACTIONATED: 0.71 [IU]/mL — AB (ref 0.30–0.70)

## 2015-03-14 LAB — APTT: aPTT: 34 seconds (ref 24–37)

## 2015-03-14 LAB — MAGNESIUM: MAGNESIUM: 2.8 mg/dL — AB (ref 1.7–2.4)

## 2015-03-14 SURGERY — CORONARY ARTERY BYPASS GRAFTING (CABG)
Anesthesia: General | Site: Chest

## 2015-03-14 MED ORDER — ROCURONIUM BROMIDE 50 MG/5ML IV SOLN
INTRAVENOUS | Status: AC
  Filled 2015-03-14: qty 1

## 2015-03-14 MED ORDER — CETYLPYRIDINIUM CHLORIDE 0.05 % MT LIQD
7.0000 mL | Freq: Two times a day (BID) | OROMUCOSAL | Status: DC
  Administered 2015-03-14 – 2015-03-19 (×5): 7 mL via OROMUCOSAL

## 2015-03-14 MED ORDER — ASPIRIN 81 MG PO CHEW: 324.0000 mg | CHEWABLE_TABLET | Freq: Every day | ORAL | Status: DC

## 2015-03-14 MED ORDER — OMEGA-3-ACID ETHYL ESTERS 1 G PO CAPS
1.0000 g | ORAL_CAPSULE | Freq: Every day | ORAL | Status: DC
  Administered 2015-03-16 – 2015-03-22 (×7): 1 g via ORAL
  Filled 2015-03-14 (×9): qty 1

## 2015-03-14 MED ORDER — LACTATED RINGERS IV SOLN
INTRAVENOUS | Status: DC | PRN
  Administered 2015-03-14 (×4): via INTRAVENOUS

## 2015-03-14 MED ORDER — POTASSIUM CHLORIDE 10 MEQ/50ML IV SOLN
10.0000 meq | INTRAVENOUS | Status: AC
  Administered 2015-03-14 (×3): 10 meq via INTRAVENOUS

## 2015-03-14 MED ORDER — INSULIN REGULAR BOLUS VIA INFUSION
0.0000 [IU] | Freq: Three times a day (TID) | INTRAVENOUS | Status: DC
  Filled 2015-03-14: qty 10

## 2015-03-14 MED ORDER — PHENYLEPHRINE HCL 10 MG/ML IJ SOLN
0.0000 ug/min | INTRAVENOUS | Status: DC
  Filled 2015-03-14: qty 2

## 2015-03-14 MED ORDER — SODIUM CHLORIDE 0.9 % IJ SOLN
OROMUCOSAL | Status: DC | PRN
  Administered 2015-03-14 (×3): 4 mL via TOPICAL

## 2015-03-14 MED ORDER — FENTANYL CITRATE (PF) 250 MCG/5ML IJ SOLN
INTRAMUSCULAR | Status: AC
  Filled 2015-03-14: qty 20

## 2015-03-14 MED ORDER — ONDANSETRON HCL 4 MG/2ML IJ SOLN
4.0000 mg | Freq: Four times a day (QID) | INTRAMUSCULAR | Status: DC | PRN
  Administered 2015-03-16: 4 mg via INTRAVENOUS
  Filled 2015-03-14 (×2): qty 2

## 2015-03-14 MED ORDER — ROCURONIUM BROMIDE 50 MG/5ML IV SOLN
INTRAVENOUS | Status: AC
  Filled 2015-03-14: qty 2

## 2015-03-14 MED ORDER — SODIUM CHLORIDE 0.45 % IV SOLN
INTRAVENOUS | Status: DC | PRN
  Administered 2015-03-14: 15:00:00 via INTRAVENOUS

## 2015-03-14 MED ORDER — LACTATED RINGERS IV SOLN
INTRAVENOUS | Status: DC
  Administered 2015-03-16: 20 mL/h via INTRAVENOUS

## 2015-03-14 MED ORDER — ALBUMIN HUMAN 5 % IV SOLN
INTRAVENOUS | Status: DC | PRN
  Administered 2015-03-14: 12:00:00 via INTRAVENOUS

## 2015-03-14 MED ORDER — METOPROLOL TARTRATE 1 MG/ML IV SOLN
2.5000 mg | INTRAVENOUS | Status: DC | PRN
  Administered 2015-03-15: 2.5 mg via INTRAVENOUS
  Administered 2015-03-16 – 2015-03-17 (×3): 5 mg via INTRAVENOUS
  Filled 2015-03-14 (×4): qty 5

## 2015-03-14 MED ORDER — MIDAZOLAM HCL 10 MG/2ML IJ SOLN
INTRAMUSCULAR | Status: AC
  Filled 2015-03-14: qty 2

## 2015-03-14 MED ORDER — SODIUM CHLORIDE 0.9 % IV SOLN
250.0000 mL | INTRAVENOUS | Status: DC
Start: 2015-03-15 — End: 2015-03-19

## 2015-03-14 MED ORDER — METOPROLOL TARTRATE 12.5 MG HALF TABLET
12.5000 mg | ORAL_TABLET | Freq: Two times a day (BID) | ORAL | Status: DC
Start: 2015-03-14 — End: 2015-03-16
  Administered 2015-03-15 (×2): 12.5 mg via ORAL
  Filled 2015-03-14 (×2): qty 1

## 2015-03-14 MED ORDER — DOCUSATE SODIUM 100 MG PO CAPS
200.0000 mg | ORAL_CAPSULE | Freq: Every day | ORAL | Status: DC
  Administered 2015-03-15 – 2015-03-22 (×8): 200 mg via ORAL
  Filled 2015-03-14 (×8): qty 2

## 2015-03-14 MED ORDER — PROPOFOL 10 MG/ML IV BOLUS
INTRAVENOUS | Status: AC
  Filled 2015-03-14: qty 20

## 2015-03-14 MED ORDER — INSULIN ASPART 100 UNIT/ML ~~LOC~~ SOLN
0.0000 [IU] | SUBCUTANEOUS | Status: DC
  Administered 2015-03-14 – 2015-03-15 (×2): 2 [IU] via SUBCUTANEOUS

## 2015-03-14 MED ORDER — LACTATED RINGERS IV SOLN
500.0000 mL | Freq: Once | INTRAVENOUS | Status: AC | PRN
Start: 2015-03-14 — End: 2015-03-15
  Administered 2015-03-14: 500 mL via INTRAVENOUS

## 2015-03-14 MED ORDER — ASPIRIN EC 325 MG PO TBEC
325.0000 mg | DELAYED_RELEASE_TABLET | Freq: Every day | ORAL | Status: DC
  Administered 2015-03-15 – 2015-03-16 (×2): 325 mg via ORAL
  Filled 2015-03-14 (×2): qty 1

## 2015-03-14 MED ORDER — HEPARIN SODIUM (PORCINE) 1000 UNIT/ML IJ SOLN
INTRAMUSCULAR | Status: AC
  Filled 2015-03-14: qty 3

## 2015-03-14 MED ORDER — PROTAMINE SULFATE 10 MG/ML IV SOLN
INTRAVENOUS | Status: AC
  Filled 2015-03-14: qty 25

## 2015-03-14 MED ORDER — 0.9 % SODIUM CHLORIDE (POUR BTL) OPTIME
TOPICAL | Status: DC | PRN
  Administered 2015-03-14: 5000 mL

## 2015-03-14 MED ORDER — METOPROLOL TARTRATE 25 MG/10 ML ORAL SUSPENSION: 12.5000 mg | Freq: Two times a day (BID) | ORAL | Status: DC

## 2015-03-14 MED ORDER — INSULIN REGULAR HUMAN 100 UNIT/ML IJ SOLN
INTRAMUSCULAR | Status: DC
  Filled 2015-03-14: qty 2.5

## 2015-03-14 MED ORDER — FENTANYL CITRATE (PF) 100 MCG/2ML IJ SOLN
INTRAMUSCULAR | Status: DC | PRN
  Administered 2015-03-14: 100 ug via INTRAVENOUS
  Administered 2015-03-14: 500 ug via INTRAVENOUS
  Administered 2015-03-14: 50 ug via INTRAVENOUS
  Administered 2015-03-14: 250 ug via INTRAVENOUS
  Administered 2015-03-14: 100 ug via INTRAVENOUS

## 2015-03-14 MED ORDER — CHLORHEXIDINE GLUCONATE 0.12 % MT SOLN
15.0000 mL | OROMUCOSAL | Status: AC
  Administered 2015-03-14: 15 mL via OROMUCOSAL

## 2015-03-14 MED ORDER — MORPHINE SULFATE (PF) 2 MG/ML IV SOLN
2.0000 mg | INTRAVENOUS | Status: DC | PRN
  Administered 2015-03-14 – 2015-03-15 (×2): 2 mg via INTRAVENOUS
  Filled 2015-03-14 (×3): qty 1

## 2015-03-14 MED ORDER — DEXMEDETOMIDINE HCL IN NACL 200 MCG/50ML IV SOLN
0.0000 ug/kg/h | INTRAVENOUS | Status: DC
  Filled 2015-03-14: qty 50

## 2015-03-14 MED ORDER — ACETAMINOPHEN 650 MG RE SUPP
650.0000 mg | Freq: Once | RECTAL | Status: AC
  Administered 2015-03-14: 650 mg via RECTAL

## 2015-03-14 MED ORDER — ACETAMINOPHEN 160 MG/5ML PO SOLN: 650.0000 mg | Freq: Once | ORAL | Status: AC

## 2015-03-14 MED ORDER — NITROGLYCERIN IN D5W 200-5 MCG/ML-% IV SOLN: 0.0000 ug/min | INTRAVENOUS | Status: DC

## 2015-03-14 MED ORDER — MIDAZOLAM HCL 2 MG/2ML IJ SOLN: 2.0000 mg | INTRAMUSCULAR | Status: DC | PRN

## 2015-03-14 MED ORDER — TRAMADOL HCL 50 MG PO TABS
50.0000 mg | ORAL_TABLET | ORAL | Status: DC | PRN
  Administered 2015-03-16: 100 mg via ORAL
  Filled 2015-03-14: qty 2

## 2015-03-14 MED ORDER — LIDOCAINE HCL (CARDIAC) 20 MG/ML IV SOLN
INTRAVENOUS | Status: DC | PRN
  Administered 2015-03-14: 50 mg via INTRAVENOUS

## 2015-03-14 MED ORDER — PROPOFOL 10 MG/ML IV BOLUS
INTRAVENOUS | Status: DC | PRN
  Administered 2015-03-14: 70 mg via INTRAVENOUS

## 2015-03-14 MED ORDER — LIDOCAINE HCL (CARDIAC) 20 MG/ML IV SOLN
INTRAVENOUS | Status: AC
  Filled 2015-03-14: qty 5

## 2015-03-14 MED ORDER — MIDAZOLAM HCL 5 MG/5ML IJ SOLN
INTRAMUSCULAR | Status: DC | PRN
  Administered 2015-03-14 (×2): 5 mg via INTRAVENOUS

## 2015-03-14 MED ORDER — PROTAMINE SULFATE 10 MG/ML IV SOLN
INTRAVENOUS | Status: DC | PRN
  Administered 2015-03-14: 50 mg via INTRAVENOUS
  Administered 2015-03-14: 10 mg via INTRAVENOUS
  Administered 2015-03-14 (×2): 50 mg via INTRAVENOUS
  Administered 2015-03-14: 40 mg via INTRAVENOUS

## 2015-03-14 MED ORDER — BISACODYL 5 MG PO TBEC
10.0000 mg | DELAYED_RELEASE_TABLET | Freq: Every day | ORAL | Status: DC
  Administered 2015-03-15 – 2015-03-22 (×5): 10 mg via ORAL
  Filled 2015-03-14 (×6): qty 2

## 2015-03-14 MED ORDER — ALBUMIN HUMAN 5 % IV SOLN
12.5000 g | INTRAVENOUS | Status: DC | PRN
  Administered 2015-03-14: 12.5 g via INTRAVENOUS
  Filled 2015-03-14: qty 500
  Filled 2015-03-14: qty 250

## 2015-03-14 MED ORDER — DEXTROSE 5 % IV SOLN
1.5000 g | Freq: Two times a day (BID) | INTRAVENOUS | Status: AC
  Administered 2015-03-14 – 2015-03-16 (×4): 1.5 g via INTRAVENOUS
  Filled 2015-03-14 (×4): qty 1.5

## 2015-03-14 MED ORDER — SODIUM CHLORIDE 0.9% FLUSH: 3.0000 mL | INTRAVENOUS | Status: DC | PRN

## 2015-03-14 MED ORDER — PANTOPRAZOLE SODIUM 40 MG PO TBEC
40.0000 mg | DELAYED_RELEASE_TABLET | Freq: Every day | ORAL | Status: DC
  Administered 2015-03-16 – 2015-03-19 (×4): 40 mg via ORAL
  Filled 2015-03-14 (×5): qty 1

## 2015-03-14 MED ORDER — PHENYLEPHRINE HCL 10 MG/ML IJ SOLN
10.0000 mg | INTRAMUSCULAR | Status: DC | PRN
  Administered 2015-03-14: 20 ug/min via INTRAVENOUS

## 2015-03-14 MED ORDER — FAMOTIDINE IN NACL 20-0.9 MG/50ML-% IV SOLN
20.0000 mg | Freq: Two times a day (BID) | INTRAVENOUS | Status: AC
  Administered 2015-03-14: 20 mg via INTRAVENOUS

## 2015-03-14 MED ORDER — MORPHINE SULFATE (PF) 2 MG/ML IV SOLN
1.0000 mg | INTRAVENOUS | Status: AC | PRN
  Administered 2015-03-14: 2 mg via INTRAVENOUS
  Filled 2015-03-14: qty 1

## 2015-03-14 MED ORDER — ACETAMINOPHEN 500 MG PO TABS
1000.0000 mg | ORAL_TABLET | Freq: Four times a day (QID) | ORAL | Status: AC
  Administered 2015-03-15 – 2015-03-19 (×18): 1000 mg via ORAL
  Filled 2015-03-14 (×18): qty 2

## 2015-03-14 MED ORDER — SODIUM CHLORIDE 0.9 % IV SOLN: INTRAVENOUS | Status: DC

## 2015-03-14 MED ORDER — ACETAMINOPHEN 160 MG/5ML PO SOLN: 1000.0000 mg | Freq: Four times a day (QID) | ORAL | Status: AC

## 2015-03-14 MED ORDER — HEPARIN SODIUM (PORCINE) 1000 UNIT/ML IJ SOLN
INTRAMUSCULAR | Status: DC | PRN
  Administered 2015-03-14: 2000 [IU] via INTRAVENOUS
  Administered 2015-03-14: 18000 [IU] via INTRAVENOUS

## 2015-03-14 MED ORDER — SODIUM CHLORIDE 0.9% FLUSH
3.0000 mL | Freq: Two times a day (BID) | INTRAVENOUS | Status: DC
  Administered 2015-03-15 – 2015-03-19 (×6): 3 mL via INTRAVENOUS

## 2015-03-14 MED ORDER — LACTATED RINGERS IV SOLN
INTRAVENOUS | Status: DC
  Administered 2015-03-18: 03:00:00 via INTRAVENOUS

## 2015-03-14 MED ORDER — HEMOSTATIC AGENTS (NO CHARGE) OPTIME
TOPICAL | Status: DC | PRN
  Administered 2015-03-14: 1 via TOPICAL

## 2015-03-14 MED ORDER — ALBUMIN HUMAN 5 % IV SOLN
250.0000 mL | INTRAVENOUS | Status: AC | PRN
  Administered 2015-03-14 (×4): 250 mL via INTRAVENOUS
  Filled 2015-03-14: qty 250

## 2015-03-14 MED ORDER — PLASMA-LYTE 148 IV SOLN
INTRAVENOUS | Status: DC | PRN
  Administered 2015-03-14: 500 mL via INTRAVASCULAR

## 2015-03-14 MED ORDER — ROCURONIUM BROMIDE 100 MG/10ML IV SOLN
INTRAVENOUS | Status: DC | PRN
  Administered 2015-03-14: 50 mg via INTRAVENOUS
  Administered 2015-03-14: 100 mg via INTRAVENOUS
  Administered 2015-03-14: 50 mg via INTRAVENOUS

## 2015-03-14 MED ORDER — BISACODYL 10 MG RE SUPP: 10.0000 mg | Freq: Every day | RECTAL | Status: DC

## 2015-03-14 MED ORDER — VANCOMYCIN HCL IN DEXTROSE 1-5 GM/200ML-% IV SOLN
1000.0000 mg | Freq: Once | INTRAVENOUS | Status: AC
  Administered 2015-03-14: 1000 mg via INTRAVENOUS
  Filled 2015-03-14: qty 200

## 2015-03-14 MED ORDER — SODIUM CHLORIDE 0.9 % IJ SOLN
INTRAMUSCULAR | Status: AC
  Filled 2015-03-14: qty 10

## 2015-03-14 MED ORDER — MAGNESIUM SULFATE 4 GM/100ML IV SOLN
4.0000 g | Freq: Once | INTRAVENOUS | Status: AC
Start: 2015-03-14 — End: 2015-03-14
  Administered 2015-03-14: 4 g via INTRAVENOUS
  Filled 2015-03-14: qty 100

## 2015-03-14 MED ORDER — OXYCODONE HCL 5 MG PO TABS
5.0000 mg | ORAL_TABLET | ORAL | Status: DC | PRN
  Administered 2015-03-15 – 2015-03-16 (×2): 5 mg via ORAL
  Administered 2015-03-19: 10 mg via ORAL
  Administered 2015-03-20 – 2015-03-22 (×3): 5 mg via ORAL
  Filled 2015-03-14 (×2): qty 1
  Filled 2015-03-14: qty 2
  Filled 2015-03-14: qty 1
  Filled 2015-03-14: qty 2
  Filled 2015-03-14: qty 1

## 2015-03-14 MED FILL — Potassium Chloride Inj 2 mEq/ML: INTRAVENOUS | Qty: 40 | Status: AC

## 2015-03-14 MED FILL — Sodium Bicarbonate IV Soln 8.4%: INTRAVENOUS | Qty: 50 | Status: AC

## 2015-03-14 MED FILL — Lidocaine HCl IV Inj 20 MG/ML: INTRAVENOUS | Qty: 5 | Status: AC

## 2015-03-14 MED FILL — Heparin Sodium (Porcine) Inj 1000 Unit/ML: INTRAMUSCULAR | Qty: 30 | Status: AC

## 2015-03-14 MED FILL — Sodium Chloride IV Soln 0.9%: INTRAVENOUS | Qty: 2000 | Status: AC

## 2015-03-14 MED FILL — Electrolyte-R (PH 7.4) Solution: INTRAVENOUS | Qty: 3000 | Status: AC

## 2015-03-14 MED FILL — Magnesium Sulfate Inj 50%: INTRAMUSCULAR | Qty: 10 | Status: AC

## 2015-03-14 MED FILL — Heparin Sodium (Porcine) Inj 1000 Unit/ML: INTRAMUSCULAR | Qty: 10 | Status: AC

## 2015-03-14 MED FILL — Mannitol IV Soln 20%: INTRAVENOUS | Qty: 500 | Status: AC

## 2015-03-14 SURGICAL SUPPLY — 80 items
ATRICLIP EXCLUSION 40 STD HAND (Clip) ×4 IMPLANT
BAG DECANTER FOR FLEXI CONT (MISCELLANEOUS) ×4 IMPLANT
BANDAGE ELASTIC 4 VELCRO ST LF (GAUZE/BANDAGES/DRESSINGS) ×4 IMPLANT
BANDAGE ELASTIC 6 VELCRO ST LF (GAUZE/BANDAGES/DRESSINGS) ×4 IMPLANT
BASKET HEART  (ORDER IN 25'S) (MISCELLANEOUS) ×1
BASKET HEART (ORDER IN 25'S) (MISCELLANEOUS) ×1
BASKET HEART (ORDER IN 25S) (MISCELLANEOUS) ×2 IMPLANT
BLADE STERNUM SYSTEM 6 (BLADE) ×4 IMPLANT
BNDG GAUZE ELAST 4 BULKY (GAUZE/BANDAGES/DRESSINGS) ×4 IMPLANT
CANISTER SUCTION 2500CC (MISCELLANEOUS) ×4 IMPLANT
CANNULA EZ GLIDE AORTIC 21FR (CANNULA) ×4 IMPLANT
CATH CPB KIT HENDRICKSON (MISCELLANEOUS) ×4 IMPLANT
CATH ROBINSON RED A/P 18FR (CATHETERS) ×4 IMPLANT
CATH THORACIC 36FR (CATHETERS) ×4 IMPLANT
CATH THORACIC 36FR RT ANG (CATHETERS) ×4 IMPLANT
CLIP TI MEDIUM 24 (CLIP) IMPLANT
CLIP TI WIDE RED SMALL 24 (CLIP) ×8 IMPLANT
CRADLE DONUT ADULT HEAD (MISCELLANEOUS) ×4 IMPLANT
DERMABOND ADVANCED (GAUZE/BANDAGES/DRESSINGS) ×2
DERMABOND ADVANCED .7 DNX12 (GAUZE/BANDAGES/DRESSINGS) ×2 IMPLANT
DRAPE CARDIOVASCULAR INCISE (DRAPES) ×4
DRAPE SLUSH/WARMER DISC (DRAPES) ×4 IMPLANT
DRAPE SRG 135X102X78XABS (DRAPES) ×2 IMPLANT
DRSG COVADERM 4X14 (GAUZE/BANDAGES/DRESSINGS) ×4 IMPLANT
ELECT REM PT RETURN 9FT ADLT (ELECTROSURGICAL) ×8
ELECTRODE REM PT RTRN 9FT ADLT (ELECTROSURGICAL) ×4 IMPLANT
GAUZE SPONGE 4X4 12PLY STRL (GAUZE/BANDAGES/DRESSINGS) ×8 IMPLANT
GLOVE BIO SURGEON STRL SZ 6 (GLOVE) ×24 IMPLANT
GLOVE BIO SURGEON STRL SZ 6.5 (GLOVE) ×30 IMPLANT
GLOVE BIO SURGEONS STRL SZ 6.5 (GLOVE) ×10
GLOVE BIOGEL PI IND STRL 6.5 (GLOVE) ×12 IMPLANT
GLOVE BIOGEL PI IND STRL 8 (GLOVE) ×2 IMPLANT
GLOVE BIOGEL PI INDICATOR 6.5 (GLOVE) ×12
GLOVE BIOGEL PI INDICATOR 8 (GLOVE) ×2
GLOVE SURG SIGNA 7.5 PF LTX (GLOVE) ×16 IMPLANT
GOWN STRL REUS W/ TWL LRG LVL3 (GOWN DISPOSABLE) ×20 IMPLANT
GOWN STRL REUS W/ TWL XL LVL3 (GOWN DISPOSABLE) ×4 IMPLANT
GOWN STRL REUS W/TWL LRG LVL3 (GOWN DISPOSABLE) ×20
GOWN STRL REUS W/TWL XL LVL3 (GOWN DISPOSABLE) ×8
HEMOSTAT POWDER SURGIFOAM 1G (HEMOSTASIS) ×12 IMPLANT
HEMOSTAT SURGICEL 2X14 (HEMOSTASIS) ×4 IMPLANT
KIT BASIN OR (CUSTOM PROCEDURE TRAY) ×4 IMPLANT
KIT CATH SUCT 8FR (CATHETERS) ×4 IMPLANT
KIT ROOM TURNOVER OR (KITS) ×4 IMPLANT
KIT SUCTION CATH 14FR (SUCTIONS) ×8 IMPLANT
KIT VASOVIEW W/TROCAR VH 2000 (KITS) ×4 IMPLANT
MARKER GRAFT CORONARY BYPASS (MISCELLANEOUS) ×12 IMPLANT
NS IRRIG 1000ML POUR BTL (IV SOLUTION) ×20 IMPLANT
PACK OPEN HEART (CUSTOM PROCEDURE TRAY) ×4 IMPLANT
PAD ARMBOARD 7.5X6 YLW CONV (MISCELLANEOUS) ×8 IMPLANT
PAD ELECT DEFIB RADIOL ZOLL (MISCELLANEOUS) ×4 IMPLANT
PENCIL BUTTON HOLSTER BLD 10FT (ELECTRODE) ×4 IMPLANT
PUNCH AORTIC ROTATE  4.5MM 8IN (MISCELLANEOUS) ×4 IMPLANT
SET CARDIOPLEGIA MPS 5001102 (MISCELLANEOUS) ×4 IMPLANT
SPONGE GAUZE 4X4 12PLY STER LF (GAUZE/BANDAGES/DRESSINGS) ×8 IMPLANT
SUT BONE WAX W31G (SUTURE) ×4 IMPLANT
SUT MNCRL AB 4-0 PS2 18 (SUTURE) ×4 IMPLANT
SUT PROLENE 3 0 SH DA (SUTURE) ×4 IMPLANT
SUT PROLENE 6 0 C 1 30 (SUTURE) ×8 IMPLANT
SUT PROLENE 7 0 BV1 MDA (SUTURE) ×8 IMPLANT
SUT STEEL 6MS V (SUTURE) ×4 IMPLANT
SUT STEEL STERNAL CCS#1 18IN (SUTURE) ×4 IMPLANT
SUT STEEL SZ 6 DBL 3X14 BALL (SUTURE) ×4 IMPLANT
SUT VIC AB 1 CTX 36 (SUTURE) ×6
SUT VIC AB 1 CTX36XBRD ANBCTR (SUTURE) ×4 IMPLANT
SUT VIC AB 3-0 SH 27 (SUTURE) ×4
SUT VIC AB 3-0 SH 27X BRD (SUTURE) ×2 IMPLANT
SUT VIC AB 3-0 X1 27 (SUTURE) ×4 IMPLANT
SUT VICRYL 4-0 PS2 18IN ABS (SUTURE) ×4 IMPLANT
SUTURE E-PAK OPEN HEART (SUTURE) ×4 IMPLANT
SYSTEM SAHARA CHEST DRAIN ATS (WOUND CARE) ×4 IMPLANT
TAPE CLOTH SURG 4X10 WHT LF (GAUZE/BANDAGES/DRESSINGS) ×4 IMPLANT
TAPE PAPER 3X10 WHT MICROPORE (GAUZE/BANDAGES/DRESSINGS) ×4 IMPLANT
TOWEL OR 17X24 6PK STRL BLUE (TOWEL DISPOSABLE) ×8 IMPLANT
TOWEL OR 17X26 10 PK STRL BLUE (TOWEL DISPOSABLE) ×8 IMPLANT
TRAY FOLEY IC TEMP SENS 16FR (CATHETERS) ×4 IMPLANT
TUBE FEEDING 8FR 16IN STR KANG (MISCELLANEOUS) ×4 IMPLANT
TUBING INSUFFLATION (TUBING) ×4 IMPLANT
UNDERPAD 30X30 INCONTINENT (UNDERPADS AND DIAPERS) ×4 IMPLANT
WATER STERILE IRR 1000ML POUR (IV SOLUTION) ×8 IMPLANT

## 2015-03-14 NOTE — OR Nursing (Signed)
1st call @1201  to SICU, spoke with Altha Harm: providing a patient update 2nd call @1221  to SICU, spoke with Altha Harm: providing a patient update, confirming plans for transfer to Room 8 postoperatively 3rd call @1232  to SICU, spoke with Altha Harm: providing a patient update 4th call @1312  to SICU, confirming plans for transfer to Room 8 2S.

## 2015-03-14 NOTE — OR Nursing (Signed)
[id number] chest incision made

## 2015-03-14 NOTE — Transfer of Care (Signed)
Immediate Anesthesia Transfer of Care Note  Patient: Jonathan Orozco  Procedure(s) Performed: Procedure(s): CORONARY ARTERY BYPASS GRAFTING (CABG) x 4 (LIMA to LAD, SVG to DIAGONAL 2, SVG SEQUENTIALLY to OM1 and OM2) (N/A) CLIPPING OF ATRIAL APPENDAGE (N/A) TRANSESOPHAGEAL ECHOCARDIOGRAM (TEE) (N/A)  Patient Location: ICU  Anesthesia Type:General  Level of Consciousness: sedated and unresponsive  Airway & Oxygen Therapy: Patient remains intubated per anesthesia plan and Patient placed on Ventilator (see vital sign flow sheet for setting)  Post-op Assessment: Report given to RN and Post -op Vital signs reviewed and stable  Post vital signs: Reviewed and stable  Last Vitals:  Filed Vitals:   03/13/15 2105 03/14/15 0457  BP: 150/76 112/64  Pulse: 75 59  Temp: 36.8 C   Resp: 18 16    Complications: No apparent anesthesia complications

## 2015-03-14 NOTE — Progress Notes (Addendum)
Dr. Roxan Hockey at bedside and made aware that patient has received a total of 4 albumin, Dr. Roxan Hockey gave a verbal order that the patient could receive 2 additional if needed.  Rowe Pavy, RN

## 2015-03-14 NOTE — Anesthesia Postprocedure Evaluation (Signed)
Anesthesia Post Note  Patient: Jonathan Orozco  Procedure(s) Performed: Procedure(s) (LRB): CORONARY ARTERY BYPASS GRAFTING (CABG) x 4 (LIMA to LAD, SVG to DIAGONAL 2, SVG SEQUENTIALLY to OM1 and OM2) (N/A) CLIPPING OF ATRIAL APPENDAGE (N/A) TRANSESOPHAGEAL ECHOCARDIOGRAM (TEE) (N/A)  Patient location during evaluation: SICU Anesthesia Type: General Level of consciousness: patient remains intubated per anesthesia plan Pain management: pain level controlled Vital Signs Assessment: post-procedure vital signs reviewed and stable Respiratory status: patient remains intubated per anesthesia plan Anesthetic complications: no    Last Vitals:  Filed Vitals:   03/14/15 2045 03/14/15 2100  BP:  108/62  Pulse: 90 82  Temp: 37.8 C 37.9 C  Resp: 16 20    Last Pain:  Filed Vitals:   03/14/15 2106  PainSc: Asleep                 Whitney Bingaman COKER

## 2015-03-14 NOTE — Progress Notes (Signed)
      [address]       [place] [address]             [phone]      Extubated C/o feeling weak  BP 100/63 mmHg  Pulse 80  Temp(Src) 99.1 F (37.3 C) (Oral)  Resp 22  Ht 6' (1.829 m)  Wt 174 lb 9.6 oz (79.198 kg)  BMI 23.67 kg/m2  SpO2 99%  CI= 3.1 PAD only and BP relatively low  Will give albumin with calcium  Remo Lipps C. Roxan Hockey, MD [facility] [phone]

## 2015-03-14 NOTE — Anesthesia Procedure Notes (Addendum)
Central Venous Catheter Insertion Performed by: anesthesiologist Patient location: Pre-op. Preanesthetic checklist: patient identified, IV checked, site marked, risks and benefits discussed, surgical consent, monitors and equipment checked, pre-op evaluation, timeout performed and anesthesia consent Landmarks identified Catheter size: 8.5 Fr PA cath was placed.Swan type and PA catheter depth:thermodilationProcedure performed using ultrasound guided technique. Attempts: 1 Following insertion, line sutured and dressing applied. Post procedure assessment: blood return through all ports. Patient tolerated the procedure well with no immediate complications.   Procedure Name: Intubation Date/Time: 03/14/2015 8:50 AM Performed by: Mariea Clonts Pre-anesthesia Checklist: Patient identified, Timeout performed, Emergency Drugs available, Suction available and Patient being monitored Patient Re-evaluated:Patient Re-evaluated prior to inductionOxygen Delivery Method: Circle system utilized Intubation Type: IV induction Ventilation: Mask ventilation without difficulty and Oral airway inserted - appropriate to patient size Grade View: Grade II Tube type: Oral Tube size: 8.0 mm Number of attempts: 1 Placement Confirmation: ETT inserted through vocal cords under direct vision,  breath sounds checked- equal and bilateral and positive ETCO2 Tube secured with: Tape Dental Injury: Teeth and Oropharynx as per pre-operative assessment

## 2015-03-14 NOTE — CV Procedure (Signed)
Intraoperative Transesophageal Echocardiography Report:  Jonathan Orozco is an 82 year old male with a history of hypertension, hyperlipidemia, paroxysmal atrial flutter, ventricular tachycardia and a remote history of tobacco and ethanol abuse. He had complained of chest discomfort with exertion for the previous 6 months. He then underwent cardiac catheterization which revealed 75% left main stenosis and significant stenosis involving the LAD and left circumflex coronary arteries. There was normal biventricular systolic function. He is now scheduled to undergo coronary artery bypass grafting by Dr. Roxan Hockey. Intraoperative transesophageal echocardiography was indicated to evaluate the left and right ventricular function, to serve as a monitor for intraoperative volume status and intracardiac air, and to assess for any valvular pathology.  The patient was brought to the operating room at [facility] and general anesthesia was induced without difficulty. Following endotracheal intubation and orogastric suctioning, the transesophageal echocardiography probe was inserted into the esophagus without difficulty.  Impression: Pre-bypass findings:   1. Aortic valve: The aortic valve was trileaflet. The leaflets opened normally without restriction. There was no aortic insufficiency.  2. Mitral valve: There was trace mitral insufficiency. The leaflets opened normally without restriction. The leaflets coapted normally without prolapsing or flail segments noted.  3. Left ventricle: There was normal appearing left ventricular systolic function with an ejection fraction estimated at 55-60%. There was mild concentric left ventricular hypertrophy and left ventricular wall thickness measured 10.5 cm at the inferior wall at end diastole at the mid-papillary level. Left ventricular end-diastolic diameter measured 37 mm. There were no regional wall motion abnormalities.  4. Right ventricle: The right ventricular  cavity was of normal size. There was normal contractility of the right ventricular free wall and normal appearing right ventricular function.  5. Tricuspid valve: The tricuspid valve appeared structurally normal with trace tricuspid insufficiency.  6. Interatrial septum: The interatrial septum was intact without evidence of patent foramen ovale or atrial septal defect by color Doppler and bubble study.  7. Left atrium: Left atrial cavity did not appear enlarged. There was no thrombus noted within left atrial appendage or left atrial cavity.  8. Ascending aorta: There was a well-defined aortic root and sinotubular ridge without dilatation or effacement. The proximal aortic root measured 2.75 cm in diameter.  9. Descending aorta: The descending aorta showed mild atheromatous disease and measured 2.75 cm in diameter.  Post-bypass findings:  1. Aortic valve: The aortic valve appeared unchanged from the pre-bypass study. The leaflets opened without restriction and there was no aortic insufficiency.   2. Mitral valve: The mitral valve appeared unchanged from the pre-bypass study. There was trace mitral insufficiency.   3. Left ventricle: There was normal appearing left ventricular systolic function. There were no regional wall motion abnormalities noted. There was a small amount of air noted within the left ventricular cavity upon separation cardiopulmonary bypass but this resolved over the next 5-10 minutes.  4. Right ventricle: There was normal appearing right ventricular function. The right ventricular size was normal and there was normal contractility of the right ventricular free wall.  5. Tricuspid valve: Tricuspid leaflets opened normally and there was trace to 1+ tricuspid insufficiency.  Roberts Gaudy, M.D.

## 2015-03-14 NOTE — Op Note (Signed)
NAMETADEH, FACEMIRE NO.:  [id number]  MEDICAL RECORD NO.:  [id number]  LOCATION:  2S08C                        FACILITY:  [facility]  PHYSICIAN:  Revonda Standard. Santana Gosdin, M.D.DATE OF BIRTH:  05-01-1928  DATE OF PROCEDURE:  03/14/2015 DATE OF DISCHARGE:                              OPERATIVE REPORT   PREOPERATIVE DIAGNOSIS:  Severe left main and 2-vessel coronary artery disease with class 3 angina and paroxysmal atrial flutter.  POSTOPERATIVE DIAGNOSIS:  Severe left main and 2-vessel coronary artery disease with class 3 angina and paroxysmal atrial flutter.  PROCEDURE:   Median sternotomy, extracorporeal circulation Coronary artery bypass grafting x 4  Left internal mammary artery to left anterior descending  Saphenous vein graft to second diagonal  Sequential saphenous vein graft to obtuse marginals 1 and 2 Endoscopic vein harvest right thigh Clipping of left atrial appendage with 40-mm AtriCure clip.  SURGEON:  Revonda Standard. Roxan Hockey, M.D.  ASSISTANT:  Lars Pinks, PA.  ANESTHESIA:  General.  FINDINGS:  Transesophageal echocardiography revealed preserved left ventricular wall motion with no significant valvular pathology.  There was no left atrial thrombus.  Good quality conduits. Heavily diseased and calcified proximal LAD and circumflex. Good quality coronaries at the  sites of anastomoses.  CLINICAL NOTE:  Mr. Lysaght is an 82 year old gentleman with a history of atrial flutter, who presented with progressive exertional angina.  At catheterization, he had severe left main and two-vessel coronary artery disease with preserved left ventricular systolic function.  Coronary artery bypass grafting was indicated for survival benefit and relief of symptoms.  The indications, risks, benefits, and alternatives were discussed in detail with the patient.  He understood and accepted the risks and agreed to proceed.  We also discussed placing a clip on  the left atrial appendage to decrease his stroke risk at the time of bypass grafting, and he wished to have that done as well.  OPERATIVE NOTE:  Mr. Biamonte was brought to the preoperative holding area on March 14, 2015.  There, Anesthesia placed a Swan-Ganz catheter and arterial blood pressure monitoring line.  He was taken to the operating room, anesthetized, and intubated.  Intravenous antibiotics were administered.  A Foley catheter was placed.  Transesophageal echocardiography was performed.  It revealed preserved left ventricular wall motion with no significant valvular pathology.  No thrombus was noted in the left atrial appendage.  The chest, abdomen, and legs were prepped and draped in the usual sterile fashion.  An incision made in the medial aspect of the right leg at the level of the knee.  The greater saphenous vein was harvested endoscopically from the right thigh.  The saphenous vein was of good quality. Simultaneously, a median sternotomy was performed and the left internal mammary artery was harvested using standard technique.  It likewise was a good quality vessel.  2000 units of heparin was administered during the vessel harvest.  The remainder of the full heparin dose was given prior to opening the pericardium.  After harvesting the conduits and giving the remainder of the heparin, the pericardium was opened.  The ascending aorta was inspected.  There was no evidence of atherosclerotic disease.  After confirming adequate anticoagulation  with ACT measurement, the aorta was cannulated via concentric 2-0 Ethibond pledgeted pursestring sutures.  A dual-stage venous cannula was placed via a pursestring suture in the right atrial appendage.  Cardiopulmonary bypass was initiated.  Flows were maintained per protocol.  The patient was cooled to 32 degrees Celsius.  The coronary arteries were inspected and anastomotic sites were chosen.  The conduits were inspected and cut  to length.  A foam pad was placed in the pericardium to insulate the heart and protect the left phrenic nerve.  A temperature probe was placed in the myocardial septum and a cardioplegia cannula was placed in the ascending aorta.  The aorta was cross-clamped.  The left ventricle was emptied via the aortic root vent.  Cardiac arrest then was achieved with a combination of cold antegrade blood cardioplegia and topical iced saline, 750 mL of cardioplegia was administered.  There was a rapid diastolic arrest and septal cooling to 10 degrees Celsius.  A reversed saphenous vein graft was placed sequentially to obtuse marginals of 1 and 2.  These were both 1.5-mm good quality target vessels.  The vein was of good quality.  OM1 was intramyocardial beyond the site of the anastomosis.  A side-to-side anastomosis was performed to OM1 and end-to-side at the OM2, both were done with running 7-0 Prolene sutures.  All anastomoses were probed proximally and distally at their completion to ensure patency.  Cardioplegia was administered down the graft.  There was good flow and good hemostasis.  Additional cardioplegia was also given via the aortic root.  A reversed saphenous vein graft was placed end-to-side to the second diagonal branch of the LAD.  This was the largest of the diagonals, it was a 1.5-mm good quality target.  The vein was of good quality.  An end- to-side anastomosis was performed with a running 7-0 Prolene suture. Again, a probe passed easily proximally and distally.  Cardioplegia was administered.  There was good flow and good hemostasis.  Additional cardioplegia was given down the aortic root.  The left internal mammary artery then was brought through a window in the pericardium.  The distal end was beveled.  It was a 1.5-mm good quality conduit.  It was anastomosed end-to-side to the distal LAD which was a 2-mm good quality target.  The end-to-side anastomosis was performed with a  running 8-0 Prolene suture.  At the completion of the anastomosis, the bulldog clamp was briefly removed to inspect for hemostasis.  Rapid septal rewarming was noted.  The bulldog clamp was replaced.  The mammary pedicle was tacked to the epicardial surface of the heart with 6-0 Prolene sutures.  After giving additional cardioplegia, the vein grafts were cut to length.  The cardioplegia cannula was removed from the ascending aorta and the proximal vein graft anastomoses were performed to 4.5 mm punch aortotomies with running 6-0 Prolene sutures.  At the completion of the final proximal anastomosis, the patient was placed in Trendelenburg position.  The bulldog clamp was again removed from the left mammary artery and septal rewarming was again noted.  The heart and aortic root were de-aired.  Lidocaine was administered.  The aortic cross-clamp was removed.  Total cross-clamp time was 62 minutes.    The patient wasinitially in ventricular fibrillation after removal of the clamp.  Air was aspirated from the vein grafts, and the bulldog clamps were removed from the veins.  While rewarming was completed, all proximal and distal anastomoses were inspected for hemostasis.  Epicardial pacing wires were  placed on the right ventricle and right atrium.  A single defibrillation with 10 joules resulted in sinus bradycardia. The patient was DDD paced. When the patient had rewarmed to a core temperature of 37 degrees Celsius, he was weaned from cardiopulmonary bypass on the first attempt without difficulty, the total bypass time was 89 minutes.    The initial cardiac index was greater than 2 L/min/m2.  Shortly, after weaning from bypass, the patient had ST-elevation.  There was no significant wall motion abnormality on transesophageal echocardiography.  A Doppler was used and there was a triphasic signal in each of the grafts.  Papaverine was applied topically to the left mammary.  Over the next few  minutes, the ST elevation resolved and the patient remained hemodynamically stable throughout that time frame.    A test dose of protamine was administered and was well tolerated.  The atrial and aortic cannulae were removed.  The remainder of the protamine was administered without incident.  The chest was irrigated with warm saline.  Hemostasis was achieved.  The pericardium was not closed.  The left pleural and mediastinal chest tubes were placed through separate subcostal incisions.  The sternum was closed with a combination of single and double heavy gauge stainless steel wires.  The pectoralis fascia, subcutaneous tissue, and skin were closed in standard fashion.  All sponge, needle, and instrument counts were correct at the end of the procedure.  The patient was taken from the operating room to the Surgical Intensive Care Unit intubated and in good condition.     Revonda Standard Roxan Hockey, M.D.     SCH/MEDQ  D:  03/14/2015  T:  03/14/2015  Job:  [id number]

## 2015-03-14 NOTE — Interval H&P Note (Signed)
History and Physical Interval Note: PFT and carotids OK  03/14/2015 8:23 AM  Jonathan Orozco  has presented today for surgery, with the diagnosis of SEVERE CAD LEFT MAIN DISEASE AFIB  The various methods of treatment have been discussed with the patient and family. After consideration of risks, benefits and other options for treatment, the patient has consented to  Procedure(s): CORONARY ARTERY BYPASS GRAFTING (CABG) (N/A) CLIPPING OF ATRIAL APPENDAGE (N/A) TRANSESOPHAGEAL ECHOCARDIOGRAM (TEE) (N/A) as a surgical intervention .  The patient's history has been reviewed, patient examined, no change in status, stable for surgery.  I have reviewed the patient's chart and labs.  Questions were answered to the patient's satisfaction.     Jonathan Orozco

## 2015-03-14 NOTE — Progress Notes (Signed)
Utilization Review Completed.  

## 2015-03-14 NOTE — Progress Notes (Signed)
Patient was able to complete VC from respiratory ok but could not do NIF, notified Dr. Roxan Hockey, patient is alert-gave verbal order to proceed with extubation, will continue to closely monitor patient.  Rowe Pavy, RN

## 2015-03-14 NOTE — Brief Op Note (Addendum)
03/12/2015 - 03/14/2015  11:40 AM  PATIENT:  Courtney Paris  82 y.o. male  PRE-OPERATIVE DIAGNOSIS:  1. SEVERE 2 VESSEL CAD 2. ATRIAL FLUTTER  POST-OPERATIVE DIAGNOSIS:  1. SEVERE 2 VESSEL CAD 2. ATRIAL FLUTTER   PROCEDURE:  TRANSESOPHAGEAL ECHOCARDIOGRAM (TEE),  MEDIAN STERNOTOMY  CORONARY ARTERY BYPASS GRAFTING (CABG) x 4  LIMA to LAD  SVG to DIAGONAL 2  SEQUENTIAL SVG to OM1 and OM  CLIPPING OF ATRIAL APPENDAGE  EVH Right Thigh  SURGEON:  Surgeon(s) and Role:    * [name], MD - Primary  PHYSICIAN ASSISTANT: Lars Pinks PA-C  ANESTHESIA:   general  EBL:  Total I/O In: -  Out: 750 [Urine:750]  DRAINS: Chest tubes placed in the mediastinal and pleural spaces   COUNTS CORRECT:  YES  PLAN OF CARE: Admit to inpatient   PATIENT DISPOSITION:  ICU - intubated and hemodynamically stable.   Delay start of Pharmacological VTE agent (>24hrs) due to surgical blood loss or risk of bleeding: yes  BASELINE WEIGHT: 79 kg  XC= 62 min CPB= 89 min  Good targets and good conduits

## 2015-03-14 NOTE — Procedures (Signed)
Extubation Procedure Note  Patient Details:   Name: Jonathan Orozco DOB: 1928-09-27 MRN: [id number]   Airway Documentation:  Airway (Active)   Positive cuff leak test, extubated on 2L [place]. RN at bedside.  Evaluation  O2 sats: stable throughout Complications: No apparent complications Patient did tolerate procedure well. Bilateral Breath Sounds: Clear, Diminished Suctioning: Airway Yes  [place] R Kaeson Kleinert 03/14/2015, 5:56 PM

## 2015-03-14 NOTE — Progress Notes (Addendum)
Pt transported to OR. Pt belongings sent with wife. Report called to OR. Unable to complete report due to medical emergency in OR.   Raliegh Ip RN

## 2015-03-14 NOTE — H&P (View-Only) (Signed)
Reason for Consult:Left main and 2 vessel CAD Referring Physician: Dr. Tamala Julian Dr. [name] is an 82 y.o. male.  HPI: 82 yo man who presents with a cc/o chest discomfort with exertion.  Mr. Anderle is an 82 yo man with a PMH significant for hypertension, hyperlipidemia, paroxysmal atrial flutter, ventricular tachycardia, remote tobacco and ethanol abuse, emphysema, possible ILD, and multiple orthopedic operations. He has been having chest "discomfort" with exertion for about 6 months. He says the pain has been more frequent and severe over the past 6 weeks. He is now having symptoms with relatively light activities. He describes it as a discomfort but not pain, pressure or tightness. He says it feels like he can't breathe. It is relieved by rest. He has not had any rest symptoms.  Today he underwent cardiac catheterization which revealed left main disease (75%) and severe 2 vessel CAD involving the LAD and circumflex. The RCA had calcification but no hemodynamically significant stenoses.  Past Medical History  Diagnosis Date  . HYPERLIPIDEMIA 09/29/2006  . ERECTILE DYSFUNCTION 09/29/2006  . 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 ABUSE, ALCOHOL, IN REMISSION 04/08/2007  . Other specified forms of hearing loss 08/10/2009  . HYPERTENSION 09/29/2006  . VENTRICULAR TACHYCARDIA 09/29/2006  . SINUSITIS- ACUTE-NOS 06/23/2007  . ALLERGIC RHINITIS 09/29/2006  . BENIGN PROSTATIC HYPERTROPHY 09/29/2006  . OSTEOARTHROSIS NOS, OTHER [facility] SITE 09/29/2006  . BACK PAIN 09/12/2008  . LEG PAIN, RIGHT 10/02/2008  . VERTIGO 04/08/2007  . COLONIC POLYPS, HX OF 06/23/2007  . CHEST PAIN 07/04/2008  . FATIGUE 11/16/2009  . VIRAL GASTROENTERITIS 01/29/2010  . GLUCOSE INTOLERANCE 03/19/2010  . Atrial flutter ([place]) 03/28/2010    a. s/p prior rfca;  b. recurrent paroxysmal flutter 04/2012;  c. pradaxa initiated 04/2012.  [name] Long term (current) use of anticoagulants 04/26/2010  . Dysrhythmia, cardiac 2012    atrial  flutter  . Diverticulosis   . MELANOMA, MALIGNANT, SKIN NOS 09/29/2006    other skin cancers -no further melanoma  . Hearing loss     hard of hearing    Past Surgical History  Procedure Laterality Date  . Left rotato cuff    . Joint replacement      Right knee  . Tonsillectomy    . Left knee arthroscopy    . Cataract extraction, bilateral Bilateral   . Colonoscopy with propofol N/A 12/08/2014    Procedure: COLONOSCOPY WITH PROPOFOL;  Surgeon: [name], MD;  Location: [facility] ENDOSCOPY;  Service: Endoscopy;  Laterality: N/A;    Family History  Problem Relation Age of Onset  . Diabetes Brother   . Esophageal cancer Father   . Colon cancer Neg Hx   . Stomach cancer Neg Hx     Social History:  reports that he has quit smoking. He has never used smokeless tobacco. He reports that he does not drink alcohol or use illicit drugs.  Allergies:  Allergies  Allergen Reactions  . Ace Inhibitors     REACTION: cough  . Amiodarone Hcl     REACTION: intolerance    Medications:  Scheduled: . [START ON 03/13/2015] aspirin  81 mg Oral Daily  . [START ON 03/13/2015] atorvastatin  10 mg Oral Daily  . [START ON 03/13/2015] diltiazem  360 mg Oral Daily  . [START ON 03/13/2015] metoprolol succinate  50 mg Oral Daily  . [START ON 03/13/2015] multivitamin with minerals  1 tablet Oral Daily  . [START ON 03/13/2015] omega-3 acid ethyl esters  1 g  Oral Daily  . psyllium  1 packet Oral Daily  . sodium chloride flush  3 mL Intravenous Q12H    Results for orders placed or performed during the hospital encounter of 03/12/15 (from the past 48 hour(s))  Protime-INR     Status: None   Collection Time: 03/12/15  7:59 AM  Result Value Ref Range   Prothrombin Time 15.2 11.6 - 15.2 seconds   INR 1.18 0.00 - 1.49    No results found.  Review of Systems  Constitutional: Positive for malaise/fatigue. Negative for fever and chills.  Eyes: Negative for blurred vision and double vision.  Respiratory: Positive for  shortness of breath.   Cardiovascular: Positive for chest pain. Negative for orthopnea, claudication and leg swelling.  Gastrointestinal: Negative for nausea and vomiting.  Genitourinary: Positive for frequency. Negative for hematuria.  Musculoskeletal: Positive for back pain and joint pain. Negative for falls.  Neurological: Negative for dizziness, focal weakness and loss of consciousness.  Endo/Heme/Allergies: Does not bruise/bleed easily.  All other systems reviewed and are negative.  Blood pressure 129/63, pulse 62, temperature 98.6 F (37 C), temperature source Oral, resp. rate 14, height 6' (1.829 m), weight 175 lb (79.379 kg), SpO2 99 %. Physical Exam  Vitals reviewed. Constitutional: He is oriented to person, place, and time. He appears well-developed and well-nourished. No distress.  HENT:  Head: Normocephalic and atraumatic.  Eyes: Conjunctivae and EOM are normal. No scleral icterus.  Neck: Normal range of motion. Neck supple. No thyromegaly present.  No bruits  Cardiovascular: Normal rate, regular rhythm, normal heart sounds and intact distal pulses.  Exam reveals no gallop and no friction rub.   No murmur heard. Respiratory: Effort normal and breath sounds normal. No respiratory distress. He has no wheezes. He has no rales.  GI: Soft. Bowel sounds are normal. He exhibits no distension. There is no tenderness.  Musculoskeletal: Normal range of motion. He exhibits no edema.  Lymphadenopathy:    He has no cervical adenopathy.  Neurological: He is oriented to person, place, and time. He exhibits normal muscle tone. Coordination normal.  Skin: Skin is warm and dry.   CARDIAC CATHETERIZATION Conclusion    1. Prox RCA to Dist RCA lesion, 25% stenosed. 2. LM lesion, 75% stenosed. 3. Ost Cx lesion, 99% stenosed. 4. Ost 1st Mrg to 1st Mrg lesion, 75% stenosed. 5. Prox LAD to Mid LAD lesion, 75% stenosed. 6. Ost 1st Diag lesion, 75% stenosed. 7. Ost 2nd Diag to 2nd Diag lesion,  65% stenosed. 8. 3rd Diag lesion, 85% stenosed.   CCS class III angina pectoris and dyspnea.  Calcific coronary artery disease with 75% distal left main, segmental 70-80% mid LAD stenosis, moderate 2 and 3 stenosis, 99% ostial circumflex, 70% proximal obtuse marginal 1, and luminal irregularities throughout a dominant right coronary.  Normal left ventricular systolic function with normal hemodynamics. EF greater than 50%.   RECOMMENDATIONS:   Elderly but fit a 8-year-old gentleman with severe left main, circumflex, and LAD disease. Class III CCS functional status and progressive over the past 6 weeks.  We'll admit to the hospital, place on IV heparin, get TCTS consult to consider revascularization options.     Assessment/Plan: 82 yo man with multiple CRF presents with progressive exertional angina, Class III. At cath he has severe left main and 2 vessel CAD with preserved LV systolic function. CABG is indicated for survival benefit and relief of symptoms.   He has a history of atrial flutter and VT. I think a  left atrial appendage clip would be beneficial as well.  I discussed the general nature of the procedure, including the need for general anesthesia, the use of cardiopulmonary bypass, and the incisions to be used with Mr and Mrs Castor. We discussed the expected hospital stay, overall recovery and short and long term outcomes. I reviewed the indications, risks, benefits and alternatives with them. They understand the risks include, but are not limited to death, stroke, MI, DVT/PE, bleeding, possible need for transfusion, infections, cardiac arrhythmias, and other organ system dysfunction including respiratory, renal, or GI complications. They understand he is at increased risk for stroke and other complications due to his advanced age.  He accepts the risks and agrees to proceed.  Plan CABG, left atrial clip on Wednesday 03/14/15  [name] 03/12/2015, 5:57 PM

## 2015-03-14 NOTE — Progress Notes (Signed)
  Echocardiogram Echocardiogram Transesophageal has been performed.  Jonathan Orozco 03/14/2015, 10:51 AM

## 2015-03-15 ENCOUNTER — Inpatient Hospital Stay (HOSPITAL_COMMUNITY): Payer: Medicare Other

## 2015-03-15 ENCOUNTER — Encounter (HOSPITAL_COMMUNITY): Payer: Self-pay | Admitting: Thoracic Surgery (Cardiothoracic Vascular Surgery)

## 2015-03-15 LAB — CBC
HCT: 29.5 % — ABNORMAL LOW (ref 39.0–52.0)
HEMATOCRIT: 29.5 % — AB (ref 39.0–52.0)
Hemoglobin: 9.6 g/dL — ABNORMAL LOW (ref 13.0–17.0)
Hemoglobin: 10.4 g/dL — ABNORMAL LOW (ref 13.0–17.0)
MCH: 30 pg (ref 26.0–34.0)
MCH: 32.6 pg (ref 26.0–34.0)
MCHC: 32.5 g/dL (ref 30.0–36.0)
MCHC: 35.3 g/dL (ref 30.0–36.0)
MCV: 92.2 fL (ref 78.0–100.0)
MCV: 92.5 fL (ref 78.0–100.0)
PLATELETS: 134 10*3/uL — AB (ref 150–400)
PLATELETS: 123 10*3/uL — AB (ref 150–400)
RBC: 3.2 MIL/uL — AB (ref 4.22–5.81)
RBC: 3.19 MIL/uL — AB (ref 4.22–5.81)
RDW: 13.2 % (ref 11.5–15.5)
RDW: 13.4 % (ref 11.5–15.5)
WBC: 11.1 10*3/uL — AB (ref 4.0–10.5)
WBC: 11.5 10*3/uL — AB (ref 4.0–10.5)

## 2015-03-15 LAB — GLUCOSE, CAPILLARY
GLUCOSE-CAPILLARY: 118 mg/dL — AB (ref 65–99)
GLUCOSE-CAPILLARY: 122 mg/dL — AB (ref 65–99)
GLUCOSE-CAPILLARY: 128 mg/dL — AB (ref 65–99)
Glucose-Capillary: 112 mg/dL — ABNORMAL HIGH (ref 65–99)
Glucose-Capillary: 121 mg/dL — ABNORMAL HIGH (ref 65–99)
Glucose-Capillary: 151 mg/dL — ABNORMAL HIGH (ref 65–99)

## 2015-03-15 LAB — BASIC METABOLIC PANEL
ANION GAP: 8 (ref 5–15)
BUN: 7 mg/dL (ref 6–20)
CALCIUM: 8.3 mg/dL — AB (ref 8.9–10.3)
CO2: 24 mmol/L (ref 22–32)
Chloride: 102 mmol/L (ref 101–111)
Creatinine, Ser: 0.78 mg/dL (ref 0.61–1.24)
GLUCOSE: 140 mg/dL — AB (ref 65–99)
POTASSIUM: 3.7 mmol/L (ref 3.5–5.1)
SODIUM: 134 mmol/L — AB (ref 135–145)

## 2015-03-15 LAB — POCT I-STAT 3, ART BLOOD GAS (G3+)
ACID-BASE EXCESS: 1 mmol/L (ref 0.0–2.0)
BICARBONATE: 24.3 meq/L — AB (ref 20.0–24.0)
O2 SAT: 93 %
TCO2: 25 mmol/L (ref 0–100)
pCO2 arterial: 32.5 mmHg — ABNORMAL LOW (ref 35.0–45.0)
pH, Arterial: 7.475 — ABNORMAL HIGH (ref 7.350–7.450)
pO2, Arterial: 57 mmHg — ABNORMAL LOW (ref 80.0–100.0)

## 2015-03-15 LAB — POCT I-STAT, CHEM 8
BUN: 10 mg/dL (ref 6–20)
CALCIUM ION: 1.2 mmol/L (ref 1.13–1.30)
CHLORIDE: 96 mmol/L — AB (ref 101–111)
Creatinine, Ser: 0.7 mg/dL (ref 0.61–1.24)
Glucose, Bld: 165 mg/dL — ABNORMAL HIGH (ref 65–99)
HCT: 32 % — ABNORMAL LOW (ref 39.0–52.0)
Hemoglobin: 10.9 g/dL — ABNORMAL LOW (ref 13.0–17.0)
POTASSIUM: 4.1 mmol/L (ref 3.5–5.1)
SODIUM: 135 mmol/L (ref 135–145)
TCO2: 23 mmol/L (ref 0–100)

## 2015-03-15 LAB — HEMOGLOBIN A1C
Hgb A1c MFr Bld: 5.7 % — ABNORMAL HIGH (ref 4.8–5.6)
Mean Plasma Glucose: 117 mg/dL

## 2015-03-15 LAB — POCT I-STAT 4, (NA,K, GLUC, HGB,HCT)
Glucose, Bld: 118 mg/dL — ABNORMAL HIGH (ref 65–99)
HCT: 33 % — ABNORMAL LOW (ref 39.0–52.0)
Hemoglobin: 11.2 g/dL — ABNORMAL LOW (ref 13.0–17.0)
POTASSIUM: 3.8 mmol/L (ref 3.5–5.1)
SODIUM: 139 mmol/L (ref 135–145)

## 2015-03-15 LAB — CREATININE, SERUM
Creatinine, Ser: 0.82 mg/dL (ref 0.61–1.24)
GFR calc Af Amer: >60 mL/min (ref >60)
GFR calc non Af Amer: >60 mL/min (ref >60)

## 2015-03-15 LAB — MAGNESIUM
Magnesium: 2 mg/dL (ref 1.7–2.4)
Magnesium: 2.1 mg/dL (ref 1.7–2.4)

## 2015-03-15 MED ORDER — AMIODARONE HCL IN DEXTROSE 360-4.14 MG/200ML-% IV SOLN
60.0000 mg/h | INTRAVENOUS | Status: AC
  Administered 2015-03-15: 60 mg/h via INTRAVENOUS
  Filled 2015-03-15: qty 200

## 2015-03-15 MED ORDER — AMIODARONE HCL IN DEXTROSE 360-4.14 MG/200ML-% IV SOLN
INTRAVENOUS | Status: AC
  Filled 2015-03-15: qty 200

## 2015-03-15 MED ORDER — INSULIN ASPART 100 UNIT/ML ~~LOC~~ SOLN
0.0000 [IU] | SUBCUTANEOUS | Status: DC
  Administered 2015-03-15: 4 [IU] via SUBCUTANEOUS
  Administered 2015-03-15 – 2015-03-16 (×2): 2 [IU] via SUBCUTANEOUS

## 2015-03-15 MED ORDER — AMIODARONE LOAD VIA INFUSION: 150.0000 mg | Freq: Once | INTRAVENOUS | Status: DC

## 2015-03-15 MED ORDER — DILTIAZEM HCL 100 MG IV SOLR
5.0000 mg/h | INTRAVENOUS | Status: DC
  Administered 2015-03-16: 15 mg/h via INTRAVENOUS
  Administered 2015-03-16: 5 mg/h via INTRAVENOUS
  Administered 2015-03-16 – 2015-03-18 (×3): 15 mg/h via INTRAVENOUS
  Filled 2015-03-15 (×6): qty 100

## 2015-03-15 MED ORDER — FUROSEMIDE 10 MG/ML IJ SOLN
40.0000 mg | Freq: Once | INTRAMUSCULAR | Status: AC
  Administered 2015-03-15: 40 mg via INTRAVENOUS
  Filled 2015-03-15: qty 4

## 2015-03-15 MED ORDER — AMIODARONE HCL IN DEXTROSE 360-4.14 MG/200ML-% IV SOLN
30.0000 mg/h | INTRAVENOUS | Status: AC
  Administered 2015-03-16 – 2015-03-18 (×4): 30 mg/h via INTRAVENOUS
  Filled 2015-03-15 (×5): qty 200

## 2015-03-15 MED ORDER — AMIODARONE IV BOLUS ONLY 150 MG/100ML
150.0000 mg | Freq: Once | INTRAVENOUS | Status: AC
  Administered 2015-03-15: 150 mg via INTRAVENOUS

## 2015-03-15 MED ORDER — AMIODARONE HCL IN DEXTROSE 360-4.14 MG/200ML-% IV SOLN: 60.0000 mg/h | INTRAVENOUS | Status: DC

## 2015-03-15 MED ORDER — ALPRAZOLAM 0.5 MG PO TABS
0.5000 mg | ORAL_TABLET | Freq: Every day | ORAL | Status: DC | PRN
  Administered 2015-03-15 – 2015-03-20 (×6): 0.5 mg via ORAL
  Filled 2015-03-15 (×7): qty 1

## 2015-03-15 MED ORDER — AMIODARONE HCL IN DEXTROSE 360-4.14 MG/200ML-% IV SOLN: 30.0000 mg/h | INTRAVENOUS | Status: DC

## 2015-03-15 MED ORDER — POTASSIUM CHLORIDE 10 MEQ/50ML IV SOLN
10.0000 meq | INTRAVENOUS | Status: AC
  Administered 2015-03-15 (×3): 10 meq via INTRAVENOUS

## 2015-03-15 NOTE — Care Management Important Message (Signed)
Important Message  Patient Details  Name: Jonathan Orozco MRN: [id number] Date of Birth: 11-15-1928   Medicare Important Message Given:  Yes    Loann Quill 03/15/2015, 8:06 AM

## 2015-03-15 NOTE — Progress Notes (Signed)
Pt developed Afib with RVR (rate 140s-180s) at 20:22. Dr. Prescott Gum on floor, notified, ordered 2.5mg  IV push metoprolol and 150mg  bolus of IV amiodarone. IV metoprolol given, no effect. IV bolus of amio infusing now. Pt A&O, reports anxiety and feels his heart "racing". Dr. Prescott Gum and pharmacy okay giving amiodarone d/t benefits outweighing risks (pt had hx of "intolerance" to amiodarone, reporting only photosensitivity).   Will monitor pt and effects of IV amio.   Henreitta Leber, RN 9:00 PM 03/15/2015

## 2015-03-15 NOTE — Care Management Note (Signed)
Case Management Note  Patient Details  Name: Jonathan Orozco MRN: [id number] Date of Birth: 04/23/1928  Subjective/Objective:   Pt lives with spouse who will be available for assistance 24/7 when he is medically stable for discharge.  Pt OOB to chair and using IS as directed.                      Expected Discharge Plan:  [place]  Discharge planning Services  CM Consult  Status of Service:  In process, will continue to follow  Medicare Important Message Given:  Yes  Girard Cooter, RN 03/15/2015, 2:08 PM

## 2015-03-15 NOTE — Progress Notes (Addendum)
CT surgery p.m. Rounds  Patient had stable day until this evening when he developed rapid atrial fibrillation and maintaining blood pressure IV amiodarone bolus given but will not continue long-term amiodarone because of patient's history of intolerance We'll use IV Cardizem to control A. fib P.m. labs reviewed and are satisfactory We'll monitor rhythm

## 2015-03-15 NOTE — Progress Notes (Addendum)
1 Day Post-Op Procedure(s) (LRB): CORONARY ARTERY BYPASS GRAFTING (CABG) x 4 (LIMA to LAD, SVG to DIAGONAL 2, SVG SEQUENTIALLY to OM1 and OM2) (N/A) CLIPPING OF ATRIAL APPENDAGE (N/A) TRANSESOPHAGEAL ECHOCARDIOGRAM (TEE) (N/A) Subjective: No complaints   Objective: Vital signs in last 24 hours: Temp:  [95.4 F (35.2 C)-100.6 F (38.1 C)] 99.5 F (37.5 C) (02/09 0700) Pulse Rate:  [71-92] 87 (02/09 0700) Cardiac Rhythm:  [-] Atrial paced (02/09 0833) Resp:  [11-25] 21 (02/09 0700) BP: (80-135)/(53-102) 124/67 mmHg (02/09 0700) SpO2:  [91 %-100 %] 96 % (02/09 0700) Arterial Line BP: (68-144)/(44-82) 131/59 mmHg (02/09 0500) FiO2 (%):  [50 %] 50 % (02/08 1537) Weight:  [83.235 kg (183 lb 8 oz)] 83.235 kg (183 lb 8 oz) (02/09 0600)  Hemodynamic parameters for last 24 hours: PAP: (14-42)/(5-21) 33/20 mmHg CO:  [3.2 L/min-7.5 L/min] 7.5 L/min CI:  [1.6 L/min/m2-3.7 L/min/m2] 3.7 L/min/m2  Intake/Output from previous day: 02/08 0701 - 02/09 0700 In: 6853.4 [P.O.:20; I.V.:4475.4; Blood:358; IV Piggyback:2000] Out: 5805 [Urine:4225; Blood:700; Chest Tube:880] Intake/Output this shift:    General appearance: alert and cooperative Neurologic: intact Heart: regular rate and rhythm, S1, S2 normal, no murmur, click, rub or gallop Lungs: clear to auscultation bilaterally Extremities: edema mild Wound: dressing dry  Lab Results:  Recent Labs  03/14/15 1923 03/15/15 0400  WBC 10.0 11.1*  HGB 10.1* 9.6*  HCT 31.3* 29.5*  PLT 147* 134*   BMET:  Recent Labs  03/14/15 0441  03/14/15 1902 03/14/15 1923 03/15/15 0400  NA 138  < > 137  --  134*  K 3.9  < > 4.3  --  3.7  CL 103  < > 103  --  102  CO2 28  --   --   --  24  GLUCOSE 96  < > 173*  --  140*  BUN 8  < > 7  --  7  CREATININE 0.78  < > 0.60* 0.80 0.78  CALCIUM 8.8*  --   --   --  8.3*  < > = values in this interval not displayed.  PT/INR:  Recent Labs  03/14/15 1323  LABPROT 17.8*  INR 1.46   ABG     Component Value Date/Time   PHART 7.362 03/14/2015 1857   HCO3 22.8 03/14/2015 1857   TCO2 23 03/14/2015 1902   ACIDBASEDEF 2.0 03/14/2015 1857   O2SAT 96.0 03/14/2015 1857   CBG (last 3)   Recent Labs  03/14/15 2357 03/15/15 0404 03/15/15 0745  GLUCAP 128* 118* 112*   CLINICAL DATA: Status post CABG  EXAM: PORTABLE CHEST 1 VIEW  COMPARISON: Portable chest x-ray of March 14, 2015  FINDINGS: There has been interval extubation of the trachea and of the esophagus. The lungs are reasonably well expanded. There is a small left pleural effusion. There is no pneumothorax. The heart is top-normal in size. The left atrial appendage clip is unchanged in appearance. The sternal wires are intact. The pulmonary vascularity is not engorged. There is retrocardiac subsegmental atelectasis on the left. Patchy subsegmental atelectasis on the right in the infrahilar region persists.  The Swan-Ganz catheter tip appears wedged in a lower lobe pulmonary artery on the right. The bilateral chest tubes are in stable position.  IMPRESSION: Persistent left lower lobe atelectasis and trace left pleural effusion. Right infrahilar atelectasis as well. Status post extubation of the trachea.  The Swan-Ganz catheter appears wedged in a right lower lobe pulmonary artery.   Electronically Signed  By: Shanon Brow  [place] M.D.  On: 03/15/2015 07:40  ECG: sinus rhythm with RBBB. No ST changes  Assessment/Plan: S/P Procedure(s) (LRB): CORONARY ARTERY BYPASS GRAFTING (CABG) x 4 (LIMA to LAD, SVG to DIAGONAL 2, SVG SEQUENTIALLY to OM1 and OM2) (N/A) CLIPPING OF ATRIAL APPENDAGE (N/A) TRANSESOPHAGEAL ECHOCARDIOGRAM (TEE) (N/A)  He is hemodynamically stable in sinus rhythm. Neo just turned off Mobilize Diuresis Diabetes control: preop Hgb A1c 5.7. Will follow CBG's for now with SSI. Dangle and if not much out from tubes will remove. DC swan, arterial line. Continue foley due to patient  in ICU and urinary output monitoring See progression orders   LOS: 3 days    Gaye Pollack 03/15/2015  Addendum: CT output 200 cc when he stood up. Will leave tubes in for now but can be up to chair.

## 2015-03-16 ENCOUNTER — Inpatient Hospital Stay (HOSPITAL_COMMUNITY): Payer: Medicare Other

## 2015-03-16 DIAGNOSIS — I480 Paroxysmal atrial fibrillation: Secondary | ICD-10-CM

## 2015-03-16 LAB — GLUCOSE, CAPILLARY
GLUCOSE-CAPILLARY: 123 mg/dL — AB (ref 65–99)
GLUCOSE-CAPILLARY: 111 mg/dL — AB (ref 65–99)
Glucose-Capillary: 114 mg/dL — ABNORMAL HIGH (ref 65–99)
Glucose-Capillary: 114 mg/dL — ABNORMAL HIGH (ref 65–99)
Glucose-Capillary: 119 mg/dL — ABNORMAL HIGH (ref 65–99)
Glucose-Capillary: 132 mg/dL — ABNORMAL HIGH (ref 65–99)

## 2015-03-16 LAB — BASIC METABOLIC PANEL
Anion gap: 6 (ref 5–15)
BUN: 10 mg/dL (ref 6–20)
CHLORIDE: 99 mmol/L — AB (ref 101–111)
CO2: 28 mmol/L (ref 22–32)
CREATININE: 0.75 mg/dL (ref 0.61–1.24)
Calcium: 8.5 mg/dL — ABNORMAL LOW (ref 8.9–10.3)
GFR calc Af Amer: >60 mL/min (ref >60)
GFR calc non Af Amer: >60 mL/min (ref >60)
GLUCOSE: 128 mg/dL — AB (ref 65–99)
POTASSIUM: 4 mmol/L (ref 3.5–5.1)
SODIUM: 133 mmol/L — AB (ref 135–145)

## 2015-03-16 LAB — TYPE AND SCREEN
ABO/RH(D): O POS
Antibody Screen: NEGATIVE
Unit division: 0
Unit division: 0

## 2015-03-16 LAB — CBC
HEMATOCRIT: 30.5 % — AB (ref 39.0–52.0)
Hemoglobin: 10 g/dL — ABNORMAL LOW (ref 13.0–17.0)
MCH: 30.4 pg (ref 26.0–34.0)
MCHC: 32.8 g/dL (ref 30.0–36.0)
MCV: 92.7 fL (ref 78.0–100.0)
Platelets: 126 10*3/uL — ABNORMAL LOW (ref 150–400)
RBC: 3.29 MIL/uL — ABNORMAL LOW (ref 4.22–5.81)
RDW: 13.5 % (ref 11.5–15.5)
WBC: 12.9 10*3/uL — AB (ref 4.0–10.5)

## 2015-03-16 LAB — HEMOGLOBIN AND HEMATOCRIT, BLOOD
HCT: 26.5 % — ABNORMAL LOW (ref 39.0–52.0)
Hemoglobin: 9 g/dL — ABNORMAL LOW (ref 13.0–17.0)

## 2015-03-16 MED ORDER — METOPROLOL TARTRATE 25 MG/10 ML ORAL SUSPENSION: 25.0000 mg | Freq: Two times a day (BID) | ORAL | Status: DC

## 2015-03-16 MED ORDER — DIGOXIN 0.25 MG/ML IJ SOLN
0.2500 mg | Freq: Once | INTRAMUSCULAR | Status: AC
  Filled 2015-03-16: qty 2

## 2015-03-16 MED ORDER — WARFARIN - PHYSICIAN DOSING INPATIENT
Freq: Every day | Status: DC
  Administered 2015-03-17 – 2015-03-21 (×4)

## 2015-03-16 MED ORDER — INSULIN ASPART 100 UNIT/ML ~~LOC~~ SOLN
0.0000 [IU] | Freq: Three times a day (TID) | SUBCUTANEOUS | Status: DC
  Administered 2015-03-16: 1 [IU] via SUBCUTANEOUS

## 2015-03-16 MED ORDER — AMIODARONE IV BOLUS ONLY 150 MG/100ML
150.0000 mg | Freq: Once | INTRAVENOUS | Status: AC
  Administered 2015-03-16: 150 mg via INTRAVENOUS

## 2015-03-16 MED ORDER — ENOXAPARIN SODIUM 40 MG/0.4ML ~~LOC~~ SOLN
40.0000 mg | SUBCUTANEOUS | Status: DC
  Administered 2015-03-16 – 2015-03-19 (×4): 40 mg via SUBCUTANEOUS
  Filled 2015-03-16 (×4): qty 0.4

## 2015-03-16 MED ORDER — METOPROLOL TARTRATE 25 MG PO TABS
25.0000 mg | ORAL_TABLET | Freq: Two times a day (BID) | ORAL | Status: DC
  Administered 2015-03-16 – 2015-03-17 (×2): 25 mg via ORAL
  Filled 2015-03-16 (×2): qty 1

## 2015-03-16 MED ORDER — DIGOXIN 0.25 MG/ML IJ SOLN
0.2500 mg | Freq: Once | INTRAMUSCULAR | Status: AC
  Administered 2015-03-16: 0.25 mg via INTRAVENOUS
  Filled 2015-03-16: qty 2

## 2015-03-16 MED ORDER — WARFARIN SODIUM 2.5 MG PO TABS
2.5000 mg | ORAL_TABLET | Freq: Once | ORAL | Status: AC
  Administered 2015-03-16: 2.5 mg via ORAL
  Filled 2015-03-16: qty 1

## 2015-03-16 MED ORDER — ZOLPIDEM TARTRATE 5 MG PO TABS: 5.0000 mg | ORAL_TABLET | Freq: Every evening | ORAL | Status: DC | PRN

## 2015-03-16 NOTE — Progress Notes (Signed)
2 Days Post-Op Procedure(s) (LRB): CORONARY ARTERY BYPASS GRAFTING (CABG) x 4 (LIMA to LAD, SVG to DIAGONAL 2, SVG SEQUENTIALLY to OM1 and OM2) (N/A) CLIPPING OF ATRIAL APPENDAGE (N/A) TRANSESOPHAGEAL ECHOCARDIOGRAM (TEE) (N/A) Subjective: C/o lack of sleep Wants a cardioversion  Objective: Vital signs in last 24 hours: Temp:  [98.4 F (36.9 C)-99.7 F (37.6 C)] 98.4 F (36.9 C) (02/10 0350) Pulse Rate:  [79-158] 82 (02/10 0700) Cardiac Rhythm:  [-] Atrial fibrillation;Ventricular paced (02/10 0400) Resp:  [12-27] 20 (02/10 0700) BP: (86-147)/(59-80) 98/70 mmHg (02/10 0700) SpO2:  [91 %-98 %] 94 % (02/10 0700) Arterial Line BP: (94-96)/(90) 94/90 mmHg (02/09 0900) Weight:  [182 lb 1.6 oz (82.6 kg)] 182 lb 1.6 oz (82.6 kg) (02/10 0500)  Hemodynamic parameters for last 24 hours: PAP: (35-38)/(17-18) 35/17 mmHg  Intake/Output from previous day: 02/09 0701 - 02/10 0700 In: 1161.3 [I.V.:1011.3; IV Piggyback:150] Out: 2395 [Urine:1895; Chest Tube:500] Intake/Output this shift:    General appearance: alert, no distress and anxious Neurologic: intact Heart: irregularly irregular rhythm Lungs: diminished breath sounds bibasilar Abdomen: normal findings: soft, non-tender  Lab Results:  Recent Labs  03/15/15 1614 03/16/15 0340  WBC 11.5* 12.9*  HGB 10.4* 10.0*  HCT 29.5* 30.5*  PLT 123* 126*   BMET:  Recent Labs  03/15/15 0400 03/15/15 1604 03/15/15 1614 03/16/15 0340  NA 134* 135  --  133*  K 3.7 4.1  --  4.0  CL 102 96*  --  99*  CO2 24  --   --  28  GLUCOSE 140* 165*  --  128*  BUN 7 10  --  10  CREATININE 0.78 0.70 0.82 0.75  CALCIUM 8.3*  --   --  8.5*    PT/INR:  Recent Labs  03/14/15 1323  LABPROT 17.8*  INR 1.46   ABG    Component Value Date/Time   PHART 7.362 03/14/2015 1857   HCO3 22.8 03/14/2015 1857   TCO2 23 03/15/2015 1604   ACIDBASEDEF 2.0 03/14/2015 1857   O2SAT 96.0 03/14/2015 1857   CBG (last 3)   Recent Labs  03/15/15 1927  03/16/15 0001 03/16/15 0350  GLUCAP 122* 132* 114*    Assessment/Plan: S/P Procedure(s) (LRB): CORONARY ARTERY BYPASS GRAFTING (CABG) x 4 (LIMA to LAD, SVG to DIAGONAL 2, SVG SEQUENTIALLY to OM1 and OM2) (N/A) CLIPPING OF ATRIAL APPENDAGE (N/A) TRANSESOPHAGEAL ECHOCARDIOGRAM (TEE) (N/A) -  CV- in a fib with rapid VR, he is on diltiazem. Received a bolus of amiodarone, but has a history of intolerance. Will ask Cardiology for input  RESP- bibasilar atelectasis- IS  RENAL- creatinine and lytes OK  Will probably need additional diuresis, but will hold off for now due to low BP  ENDO- CBG well controlled  Anemia secondary to ABL- mild, follow   LOS: 4 days    [name] 03/16/2015

## 2015-03-16 NOTE — Progress Notes (Signed)
Cardizem on hold for systolic pressure in the low 80's. Dr. Burt Knack notified.  Will continue to monitor. - Soyla Dryer, RN

## 2015-03-16 NOTE — Progress Notes (Signed)
    Subjective:  Denies CP or dyspnea; complains of no sleep   Objective:  Filed Vitals:   03/16/15 0548 03/16/15 0600 03/16/15 0630 03/16/15 0700  BP:  86/66 91/64 98/70   Pulse: 128 124 118 82  Temp:      TempSrc:      Resp: 20 18 19 20   Height:      Weight:      SpO2: 92% 97% 95% 94%    Intake/Output from previous day:  Intake/Output Summary (Last 24 hours) at 03/16/15 0758 Last data filed at 03/16/15 0600  Gross per 24 hour  Intake 1161.3 ml  Output   2395 ml  Net -1233.7 ml    Physical Exam: Physical exam: Well-developed well-nourished in no acute distress.  Skin is warm and dry.  HEENT is normal.  Neck is supple.  Chest with diminished BS; mild exp wheeze; previous sternotomy Cardiovascular exam tachycardic and irregular Abdominal exam nontender or distended. No masses palpated. Extremities show no edema. neuro grossly intact    Lab Results: Basic Metabolic Panel:  Recent Labs  03/15/15 0400 03/15/15 1604 03/15/15 1614 03/16/15 0340  NA 134* 135  --  133*  K 3.7 4.1  --  4.0  CL 102 96*  --  99*  CO2 24  --   --  28  GLUCOSE 140* 165*  --  128*  BUN 7 10  --  10  CREATININE 0.78 0.70 0.82 0.75  CALCIUM 8.3*  --   --  8.5*  MG 2.0  --  2.1  --    CBC:  Recent Labs  03/15/15 1614 03/16/15 0340  WBC 11.5* 12.9*  HGB 10.4* 10.0*  HCT 29.5* 30.5*  MCV 92.5 92.7  PLT 123* 126*    Assessment/Plan:  1 CAD s/p CABG-continue ASA and statin 2 postoperative atrial fibrillation-patient has developed atrial fibrillation; states his intolerance previously was photosensitivity and "it didn't work". I would favor starting IV amiodarone and continuing for 6-8 weeks. We could proceed with DCCV but pt unlikely to hold sinus this close to surgery. Resume coumadin when ok with surgery. Continue metoprolol. 3 Postoperative volume excess- diurese as tolerated.   Kirk Ruths 03/16/2015, 7:58 AM

## 2015-03-16 NOTE — Progress Notes (Signed)
Called Dr. Prescott Gum at 05:15 d/t pt's unchanged heart rhythm. He gave orders for another IV amiodarone bolus as well as 0.25mg  injection of digoxin. Pt's rate now 100-130s. MD called again at 07:15 to ensure pt kept NPO this AM for possible cardioversion.  I passed this info to the oncoming nurse, Shelba Flake.  Henreitta Leber, RN 7:17 AM 03/16/2015

## 2015-03-16 NOTE — Progress Notes (Signed)
Pt's HR unchanged (Afib 140s-160s) after 4 hours of IV amiodarone (the 150mg  bolus followed by the 60mg /hr infusion). Per previous discussion with Dr. Prescott Gum, stopped amiodarone and started diltiazem drip at 5mg /hr per titration instructions.  Will continue to monitor.  Henreitta Leber, RN 1:29 AM 03/16/2015

## 2015-03-16 NOTE — Progress Notes (Signed)
      [address]       [place] [address]             [phone]      Asleep currently  BP 83/60 mmHg  Pulse 139  Temp(Src) 98.6 F (37 C) (Oral)  Resp 13  Ht 6' (1.829 m)  Wt 182 lb 1.6 oz (82.6 kg)  BMI 24.69 kg/m2  SpO2 90%  Still in atrial fib with rate in 110-130 range. Diltiazem off due to BP Will continue amiodarone per Dr. Stanford Breed Will give another dose of digoxin this evening  Start coumadin at 2.5 was on 5/7.5 at home, not ready for full anticoagulation yet at this point.  Revonda Standard Roxan Hockey, MD [facility] [phone]

## 2015-03-16 NOTE — Discharge Summary (Signed)
Physician Discharge Summary       Jonathan Orozco.[address]       [place] [address]             [phone]    Patient ID: ADNAAN MOWAD MRN: [id number] DOB/AGE: January 26, 1929 82 y.o.  Admit date: 03/12/2015 Discharge date: 03/23/2015  Admission Diagnoses: 1. Class III angina 2. Multivessel CAD 3. Paroxysmal atrial flutter  Active Diagnoses:  1. Hyperlipidemia 2. Ventricular tachycardia 3. History of previous alcohol abuse 4. Hypertension 5. BPH 6. Malignant melanoma 7. Hearing loss 8. Tobacco abuse 9. Vertigo 10. Allergic rhinitis 11. ABL anemia  Procedure (s):  Left Heart Cath and Coronary Angiography by Dr. Tamala Julian on 03/12/2015:    Conclusion    1. Prox RCA to Dist RCA lesion, 25% stenosed. 2. LM lesion, 75% stenosed. 3. Ost Cx lesion, 99% stenosed. 4. Ost 1st Mrg to 1st Mrg lesion, 75% stenosed. 5. Prox LAD to Mid LAD lesion, 75% stenosed. 6. Ost 1st Diag lesion, 75% stenosed. 7. Ost 2nd Diag to 2nd Diag lesion, 65% stenosed. 8. 3rd Diag lesion, 85% stenosed.   CCS class III angina pectoris and dyspnea.  Calcific coronary artery disease with 75% distal left main, segmental 70-80% mid LAD stenosis, moderate 2 and 3 stenosis, 99% ostial circumflex, 70% proximal obtuse marginal 1, and luminal irregularities throughout a dominant right coronary.  Normal left ventricular systolic function with normal hemodynamics. EF greater than 50%.    Median sternotomy, extracorporeal circulation, coronary artery bypass grafting x4 (left internal mammary artery to left anterior descending, saphenous vein graft to second diagonal, sequential saphenous vein graft to obtuse marginals 1 and 2), endoscopic vein harvest right thigh, clipping of left atrial appendage with 40-mm AtriCure clip by Dr. Roxan Hockey on 03/14/2015.  History of Presenting Illness: Jonathan Orozco is an 82 yo man with a PMH significant for hypertension, hyperlipidemia, paroxysmal atrial flutter, ventricular  tachycardia, remote tobacco and ethanol abuse, emphysema, possible ILD, and multiple orthopedic operations. He has been having chest "discomfort" with exertion for about 6 months. He says the pain has been more frequent and severe over the past 6 weeks. He is now having symptoms with relatively light activities. He describes it as a discomfort but not pain, pressure or tightness. He says it feels like he can't breathe. It is relieved by rest. He has not had any rest symptoms.  He underwent cardiac catheterization on 03/12/2015 which revealed left main disease (75%) and severe 2 vessel CAD involving the LAD and circumflex. The RCA had calcification but no hemodynamically significant stenoses.  Dr. Roxan Hockey discussed the general nature of the procedure, including the need for general anesthesia, the use of cardiopulmonary bypass, and the incisions to be used with Mr and Mrs Benyo. Dr. Roxan Hockey discussed the expected hospital stay, overall recovery and short and long term outcomes. he reviewed the indications, risks, benefits and alternatives with them. Patient agreed to proceed with surgery. Pre operative duplex carotid US showed no significant internal carotid artery stenosis bilaterally. He underwent a CABG x 4 on 03/14/2015.  Brief Hospital Course:  The patient was extubated the evening of surgery without difficulty. He remained afebrile and hemodynamically stable. Gordy Councilman, a line, chest tubes, and foley were removed early in the post operative course. Lopressor was started and titrated accordingly. He was volume over loaded and diuresed. He had ABL anemia. He did not require a post op transfusion. His last H and H was 10.4. He also had thrombocytopenia. His last platelet  count was 294. He was weaned off the insulin drip. The patient's HGA1C pre op was  5.7. The patient was felt surgically stable for transfer from the ICU to PCTU for further convalescence.  He continues to progress with cardiac  rehab. He initially required 2 liters of oxygen via [place]. He was later weaned to  room air.  He has a history of Atrial fibrillation.  He was restarted on coumadin for stroke prophylaxis.  He continued to have rapid A. Fib and it was felt cardioversion would be beneficial.  However, the patient converted to NSR on his own.  His INR today is up to 3.6. He will be discharged on Coumadin 2.5 mg and will need a follow up PT and INR on Monday 03/26/2015. He will resume PT/INR follow up at the Coumadin clinic on [address] who monitors his coumadin. He has been tolerating a diet and has had a bowel movement. Epicardial pacing wires and chest tube sutures will be removed prior to discharge. The patient is felt surgically stable for discharge today.  Latest Vital Signs: Blood pressure 104/61, pulse 74, temperature 98.1 F (36.7 C), temperature source Oral, resp. rate 18, height 6' (1.829 m), weight 181 lb (82.101 kg), SpO2 94 %.  Physical Exam: General appearance: alert, no distress and anxious Neurologic: intact Heart: irregularly irregular rhythm Lungs: diminished breath sounds bibasilar Abdomen: normal findings: soft, non-tender  Discharge Condition: Stable, discharged home  Recent laboratory studies:  Lab Results  Component Value Date   WBC 7.7 03/23/2015   HGB 10.4* 03/23/2015   HCT 30.7* 03/23/2015   MCV 93.9 03/23/2015   PLT 294 03/23/2015   Lab Results  Component Value Date   NA 131* 03/20/2015   K 3.8 03/20/2015   CL 97* 03/20/2015   CO2 25 03/20/2015   CREATININE 0.74 03/20/2015   GLUCOSE 101* 03/20/2015    Diagnostic Studies: Dg Chest Port 1 View  03/16/2015  CLINICAL DATA:  Status post CABG 2 days ago; shortness of breath today. EXAM: PORTABLE CHEST 1 VIEW COMPARISON:  Portable chest x-ray of March 15, 2015 FINDINGS: The lungs are adequately inflated. There is no pneumothorax or pneumomediastinum. There is persistent left lower lobe atelectasis. There is persistent  obscuration of the hemidiaphragms. The pulmonary interstitial markings are slightly more conspicuous today. The cardiac silhouette remains enlarged. The sternal wires are intact. The left atrial appendage clip is unchanged. The chest tubes have been removed as has the Swan-Ganz catheter. The right internal jugular Cordis sheath is unchanged with the tip projecting over the proximal SVC. IMPRESSION: Slight interval increased prominence of the pulmonary interstitial markings especially on the left. Persistent left lower lobe atelectasis and small left pleural effusion. Minimal subsegmental right and perihilar and infrahilar atelectasis. No pneumothorax. Electronically Signed   By: David  Martinique M.D.   On: 03/16/2015 07:43       Discharge Instructions    Amb Referral to Cardiac Rehabilitation    Complete by:  As directed   Diagnosis:  CABG          Discharge Medications:   Medication List    STOP taking these medications        diltiazem 360 MG 24 hr tablet  Commonly known as:  MATZIM LA     metoprolol succinate 100 MG 24 hr tablet  Commonly known as:  TOPROL-XL      TAKE these medications        ALPRAZolam 0.5 MG tablet  Commonly known as:  XANAX  Take 1 tablet (0.5 mg total) by mouth daily as needed for anxiety.     amiodarone 400 MG tablet  Commonly known as:  PACERONE  Take 1 tablet (400 mg total) by mouth 2 (two) times daily. For one week;then take Amiodarone 400 mg daily by mouth thereafter     aspirin 81 MG EC tablet  Take 1 tablet (81 mg total) by mouth daily.     atorvastatin 10 MG tablet  Commonly known as:  LIPITOR  Take 1 tablet (10 mg total) by mouth daily.     fish oil-omega-3 fatty acids 1000 MG capsule  Take 1 g by mouth daily.     furosemide 40 MG tablet  Commonly known as:  LASIX  Take 1 tablet (40 mg total) by mouth daily. For 7 days     HYDROcodone-acetaminophen 5-325 MG tablet  Commonly known as:  NORCO/VICODIN  Take 1 tablet by mouth every 6 (six)  hours as needed for moderate pain (body pain).     HYDROcodone-acetaminophen 5-325 MG tablet  Commonly known as:  NORCO/VICODIN  Take 1 tablet by mouth every 4 (four) hours as needed for moderate pain.     metoprolol 50 MG tablet  Commonly known as:  LOPRESSOR  Take 1 tablet (50 mg total) by mouth 2 (two) times daily.     multivitamin tablet  Take 1 tablet by mouth daily.     potassium chloride SA 20 MEQ tablet  Commonly known as:  K-DUR,KLOR-CON  Take 1 tablet (20 mEq total) by mouth daily. For 7 days     psyllium 58.6 % packet  Commonly known as:  METAMUCIL  Take 1 packet by mouth daily.     RESTORA Caps  Take 1 capsule by mouth daily.     warfarin 5 MG tablet  Commonly known as:  COUMADIN  Take 1/2 tablet every evening or as directed       The patient has been discharged on:   1.Beta Blocker:  Yes [ x  ]                              No   [   ]                              If No, reason:  2.Ace Inhibitor/ARB: Yes [   ]                                     No  [ x   ]                                     If No, reason: labile BP  3.Statin:   Yes [ x  ]                  No  [   ]                  If No, reason:  4.Ecasa:  Yes  [ x  ]                  No   [   ]  If No, reason:  Follow Up Appointments: Follow-up Information    Follow up with [name], MD On 04/17/2015.   Specialty:  Cardiothoracic Surgery   Why:  PA/LAT CXR to be taken (at [facility] which is in the same building as Dr. Leonarda Salon office) on 04/17/2015 at 9:15 [id number] time is at 10:00 am   Contact information:   [place] [address] [phone]       Follow up with Richardson Dopp, PA-C On 04/02/2015.   Specialties:  Physician Assistant, Radiology, Interventional Cardiology   Why:  Appointment time is at 11:30 am   Contact information:   [address] [address] [phone]       Follow  up with PT/INR.   Why:  Please contact [facility] to have PT and INR drawn (as is on Coumadin) on Monday 03/26/2015      Follow up with [facility].   Why:  HH-RN/PT arrangd- they will call you to arrange visits   Contact information:   [address] [place] [address] [phone]       Follow up with Richardson Dopp, PA-C On 04/02/2015.   Specialties:  Physician Assistant, Radiology, Interventional Cardiology   Why:  11:30 AM   Contact information:   [id number] [place] [address] [phone]       Signed: Lars Pinks MPA-C 03/23/2015, 11:21 AM

## 2015-03-17 ENCOUNTER — Inpatient Hospital Stay (HOSPITAL_COMMUNITY): Payer: Medicare Other

## 2015-03-17 LAB — BASIC METABOLIC PANEL
Anion gap: 5 (ref 5–15)
BUN: 11 mg/dL (ref 6–20)
CHLORIDE: 97 mmol/L — AB (ref 101–111)
CO2: 29 mmol/L (ref 22–32)
CREATININE: 0.71 mg/dL (ref 0.61–1.24)
Calcium: 8 mg/dL — ABNORMAL LOW (ref 8.9–10.3)
GFR calc non Af Amer: >60 mL/min (ref >60)
GLUCOSE: 112 mg/dL — AB (ref 65–99)
Potassium: 4 mmol/L (ref 3.5–5.1)
Sodium: 131 mmol/L — ABNORMAL LOW (ref 135–145)

## 2015-03-17 LAB — CBC
HEMATOCRIT: 29 % — AB (ref 39.0–52.0)
HEMOGLOBIN: 9.6 g/dL — AB (ref 13.0–17.0)
MCH: 30.9 pg (ref 26.0–34.0)
MCHC: 33.1 g/dL (ref 30.0–36.0)
MCV: 93.2 fL (ref 78.0–100.0)
Platelets: 132 10*3/uL — ABNORMAL LOW (ref 150–400)
RBC: 3.11 MIL/uL — ABNORMAL LOW (ref 4.22–5.81)
RDW: 13.5 % (ref 11.5–15.5)
WBC: 8.8 10*3/uL (ref 4.0–10.5)

## 2015-03-17 LAB — GLUCOSE, CAPILLARY
GLUCOSE-CAPILLARY: 118 mg/dL — AB (ref 65–99)
GLUCOSE-CAPILLARY: 96 mg/dL (ref 65–99)
Glucose-Capillary: 112 mg/dL — ABNORMAL HIGH (ref 65–99)

## 2015-03-17 MED ORDER — ASPIRIN 81 MG PO CHEW
81.0000 mg | CHEWABLE_TABLET | Freq: Every day | ORAL | Status: DC
  Administered 2015-03-23: 81 mg
  Filled 2015-03-17 (×2): qty 1

## 2015-03-17 MED ORDER — METOPROLOL TARTRATE 25 MG PO TABS
25.0000 mg | ORAL_TABLET | Freq: Four times a day (QID) | ORAL | Status: DC
  Administered 2015-03-17 – 2015-03-19 (×5): 25 mg via ORAL
  Filled 2015-03-17 (×6): qty 1

## 2015-03-17 MED ORDER — AMIODARONE LOAD VIA INFUSION
150.0000 mg | Freq: Once | INTRAVENOUS | Status: AC
  Administered 2015-03-17: 150 mg via INTRAVENOUS
  Filled 2015-03-17: qty 83.34

## 2015-03-17 MED ORDER — WARFARIN SODIUM 5 MG PO TABS
5.0000 mg | ORAL_TABLET | Freq: Once | ORAL | Status: AC
  Administered 2015-03-17: 5 mg via ORAL
  Filled 2015-03-17: qty 1

## 2015-03-17 MED ORDER — AMIODARONE IV BOLUS ONLY 150 MG/100ML: 150.0000 mg | Freq: Once | INTRAVENOUS | Status: DC

## 2015-03-17 MED ORDER — DIGOXIN 0.25 MG/ML IJ SOLN
0.2500 mg | Freq: Once | INTRAMUSCULAR | Status: AC
  Administered 2015-03-17: 0.25 mg via INTRAVENOUS
  Filled 2015-03-17: qty 2

## 2015-03-17 MED ORDER — ASPIRIN EC 81 MG PO TBEC
81.0000 mg | DELAYED_RELEASE_TABLET | Freq: Every day | ORAL | Status: DC
  Administered 2015-03-17 – 2015-03-22 (×6): 81 mg via ORAL
  Filled 2015-03-17 (×7): qty 1

## 2015-03-17 NOTE — Progress Notes (Signed)
    Subjective:  Denies CP or dyspnea   Objective:  Filed Vitals:   03/17/15 0900 03/17/15 1000 03/17/15 1100 03/17/15 1206  BP:  100/65 103/82   Pulse: 145 130 125   Temp:    98.9 F (37.2 C)  TempSrc:    Oral  Resp: 20 13 17    Height:      Weight:      SpO2: 96% 96% 96%     Intake/Output from previous day:  Intake/Output Summary (Last 24 hours) at 03/17/15 1244 Last data filed at 03/17/15 1100  Gross per 24 hour  Intake  620.2 ml  Output    990 ml  Net -369.8 ml    Physical Exam: Physical exam: Well-developed well-nourished in no acute distress.  Skin is warm and dry.  HEENT is normal.  Neck is supple.  Chest with diminished BS; mild exp wheeze; previous sternotomy Cardiovascular exam tachycardic and irregular Abdominal exam nontender or distended. No masses palpated. Extremities show no edema. neuro grossly intact    Lab Results: Basic Metabolic Panel:  Recent Labs  03/15/15 0400  03/15/15 1614 03/16/15 0340 03/17/15 0515  NA 134*  < >  --  133* 131*  K 3.7  < >  --  4.0 4.0  CL 102  < >  --  99* 97*  CO2 24  --   --  28 29  GLUCOSE 140*  < >  --  128* 112*  BUN 7  < >  --  10 11  CREATININE 0.78  < > 0.82 0.75 0.71  CALCIUM 8.3*  --   --  8.5* 8.0*  MG 2.0  --  2.1  --   --   < > = values in this interval not displayed. CBC:  Recent Labs  03/16/15 0340 03/17/15 0515  WBC 12.9* 8.8  HGB 10.0* 9.6*  HCT 30.5* 29.0*  MCV 92.7 93.2  PLT 126* 132*    Assessment/Plan:  1 CAD s/p CABG-continue ASA and statin 2 postoperative atrial fibrillation-patient remains in atrial fibrillation; previously stated his intolerance was photosensitivity and "it didn't work". We will continue amiodarone load, cardizem and metoprolol; coumadin initiated. We could proceed with DCCV now but pt unlikely to hold sinus this close to surgery. Will follow and if atrial fibrillation persists proceed with TEE guided DCCV next week (after loading with amiodarone,  maintenance of sinus likely more probable).  3 Postoperative volume excess- diurese as tolerated.   Kirk Ruths 03/17/2015, 12:44 PM

## 2015-03-17 NOTE — Progress Notes (Signed)
3 Days Post-Op Procedure(s) (LRB): CORONARY ARTERY BYPASS GRAFTING (CABG) x 4 (LIMA to LAD, SVG to DIAGONAL 2, SVG SEQUENTIALLY to OM1 and OM2) (N/A) CLIPPING OF ATRIAL APPENDAGE (N/A) TRANSESOPHAGEAL ECHOCARDIOGRAM (TEE) (N/A) Subjective: Concerned about HR  Objective: Vital signs in last 24 hours: Temp:  [97.6 F (36.4 C)-98.6 F (37 C)] 97.6 F (36.4 C) (02/10 2030) Pulse Rate:  [66-156] 156 (02/11 0800) Cardiac Rhythm:  [-] Atrial fibrillation (02/11 0800) Resp:  [12-24] 19 (02/11 0800) BP: (80-121)/(55-81) 98/69 mmHg (02/11 0800) SpO2:  [87 %-97 %] 96 % (02/11 0800) Weight:  [182 lb 8.7 oz (82.8 kg)] 182 lb 8.7 oz (82.8 kg) (02/11 0500)  Hemodynamic parameters for last 24 hours:    Intake/Output from previous day: 02/10 0701 - 02/11 0700 In: 737.4 [I.V.:687.4; IV Piggyback:50] Out: [id number] [Urine:1215] Intake/Output this shift: Total I/O In: 41.7 [I.V.:41.7] Out: -   General appearance: alert, cooperative and no distress Neurologic: intact Heart: tachy, irregular Lungs: diminished breath sounds bibasilar Abdomen: normal findings: soft, non-tender  Lab Results:  Recent Labs  03/16/15 0340 03/17/15 0515  WBC 12.9* 8.8  HGB 10.0* 9.6*  HCT 30.5* 29.0*  PLT 126* 132*   BMET:  Recent Labs  03/16/15 0340 03/17/15 0515  NA 133* 131*  K 4.0 4.0  CL 99* 97*  CO2 28 29  GLUCOSE 128* 112*  BUN 10 11  CREATININE 0.75 0.71  CALCIUM 8.5* 8.0*    PT/INR:  Recent Labs  03/14/15 1323  LABPROT 17.8*  INR 1.46   ABG    Component Value Date/Time   PHART 7.362 03/14/2015 1857   HCO3 22.8 03/14/2015 1857   TCO2 23 03/15/2015 1604   ACIDBASEDEF 2.0 03/14/2015 1857   O2SAT 96.0 03/14/2015 1857   CBG (last 3)   Recent Labs  03/16/15 1145 03/16/15 1540 03/16/15 2127  GLUCAP 114* 123* 111*    Assessment/Plan: S/P Procedure(s) (LRB): CORONARY ARTERY BYPASS GRAFTING (CABG) x 4 (LIMA to LAD, SVG to DIAGONAL 2, SVG SEQUENTIALLY to OM1 and OM2)  (N/A) CLIPPING OF ATRIAL APPENDAGE (N/A) TRANSESOPHAGEAL ECHOCARDIOGRAM (TEE) (N/A) -  CV- still in rapid atrial fibrillation on amiodarone, diltiazem, metoprolol and digoxin  Will rebolus amiodarone this AM  Will give an additional dose of digoxin  Continue metoprolol  Titrate diltiazem  Will give 5 mg coumadin today  RESP- small left effusion, some atelectasis- IS, diurese when BP allows  Dc Foley  RENAL- creatinine and lytes OK  ENDO- cbg well controlled   LOS: 5 days    [name] 03/17/2015

## 2015-03-17 NOTE — Progress Notes (Signed)
On Call Cardiology Note  S: I was contacted by the patient's RN regarding HR in the 180's.  Patient is s/p CABG, which has been complicated by difficult to control atrial fibrillation with RVR.  At this time, the patient reports having intermittent palpitations but otherwise feels well without chest pain, shortness of breath, and dizziness.  His medications include amiodarone gtt, diltiazem gtt, metoprolol tartrate, and recent digoxin load.  He was given metoprolol 5 mg IV x 1 with accompanying increase in diltiazem infusion to 10 mg/hr when I was called.  O: Temp:  [97.6 F (36.4 C)-99.4 F (37.4 C)] 98.4 F (36.9 C) (02/11 2010) Pulse Rate:  [105-156] 138 (02/11 2000) Resp:  [12-24] 21 (02/11 2000) BP: (80-127)/(56-91) 108/56 mmHg (02/11 2000) SpO2:  [90 %-98 %] 96 % (02/11 2000) Weight:  [82.8 kg (182 lb 8.7 oz)] 82.8 kg (182 lb 8.7 oz) (02/11 0500)  Gen: Elderly man lying comfortably in bed Resp: Clear anteriorly CV: Tachycardic and irregularly irregular.  No murmus.  Clean dressing covers median sternotomy. Ext: No LE edema; left calf is wrapped.  EKG: atrial fibrillation with rapid ventricular response (ventricular rate 137 bpm) and RBBB  A/P: 82 y/o man s/p CABG with history of atrial fibrillation, which has been difficult to rate control in the postoperative period.  His HR has improved with escalation of diltiazem gtt and metoprolol IV bolus.  I will switch his standing oral metoprolol to 25 mg Q6 hours for increased and more sustained beta-blockade.  Hopefully, this will allow for downtitration of diltiazem gtt.  Nelva Bush, MD Cardiology Fellow

## 2015-03-17 NOTE — Progress Notes (Signed)
      [address]       [place] [address]             [phone]      Remains in atrial fib but rate better controlled  BP 112/91 mmHg  Pulse 119  Temp(Src) 99.4 F (37.4 C) (Oral)  Resp 18  Ht 6' (1.829 m)  Wt 182 lb 8.7 oz (82.8 kg)  BMI 24.75 kg/m2  SpO2 96%   Intake/Output Summary (Last 24 hours) at 03/17/15 1815 Last data filed at 03/17/15 1800  Gross per 24 hour  Intake  842.1 ml  Output   1270 ml  Net -427.9 ml    Continue amiodarone, metoprolol and diltiazem  Remo Lipps C. Roxan Hockey, MD [facility] [phone]

## 2015-03-18 ENCOUNTER — Inpatient Hospital Stay (HOSPITAL_COMMUNITY): Payer: Medicare Other

## 2015-03-18 LAB — BASIC METABOLIC PANEL
Anion gap: 7 (ref 5–15)
BUN: 9 mg/dL (ref 6–20)
CHLORIDE: 100 mmol/L — AB (ref 101–111)
CO2: 29 mmol/L (ref 22–32)
CREATININE: 0.68 mg/dL (ref 0.61–1.24)
Calcium: 8.1 mg/dL — ABNORMAL LOW (ref 8.9–10.3)
GFR calc Af Amer: >60 mL/min (ref >60)
GFR calc non Af Amer: >60 mL/min (ref >60)
Glucose, Bld: 106 mg/dL — ABNORMAL HIGH (ref 65–99)
Potassium: 3.8 mmol/L (ref 3.5–5.1)
Sodium: 136 mmol/L (ref 135–145)

## 2015-03-18 LAB — GLUCOSE, CAPILLARY
GLUCOSE-CAPILLARY: 99 mg/dL (ref 65–99)
Glucose-Capillary: 110 mg/dL — ABNORMAL HIGH (ref 65–99)

## 2015-03-18 LAB — PROTIME-INR
INR: 1.17 (ref 0.00–1.49)
Prothrombin Time: 15.1 seconds (ref 11.6–15.2)

## 2015-03-18 LAB — CBC
HEMATOCRIT: 30.2 % — AB (ref 39.0–52.0)
HEMOGLOBIN: 10 g/dL — AB (ref 13.0–17.0)
MCH: 30.8 pg (ref 26.0–34.0)
MCHC: 33.1 g/dL (ref 30.0–36.0)
MCV: 92.9 fL (ref 78.0–100.0)
Platelets: 171 10*3/uL (ref 150–400)
RBC: 3.25 MIL/uL — ABNORMAL LOW (ref 4.22–5.81)
RDW: 13.4 % (ref 11.5–15.5)
WBC: 7.5 10*3/uL (ref 4.0–10.5)

## 2015-03-18 MED ORDER — AMIODARONE HCL 200 MG PO TABS
400.0000 mg | ORAL_TABLET | Freq: Two times a day (BID) | ORAL | Status: DC
  Administered 2015-03-18 – 2015-03-19 (×4): 400 mg via ORAL
  Filled 2015-03-18 (×4): qty 2

## 2015-03-18 MED ORDER — WARFARIN SODIUM 7.5 MG PO TABS
7.5000 mg | ORAL_TABLET | Freq: Every day | ORAL | Status: DC
  Administered 2015-03-18 – 2015-03-21 (×4): 7.5 mg via ORAL
  Filled 2015-03-18 (×4): qty 1

## 2015-03-18 MED ORDER — POTASSIUM CHLORIDE CRYS ER 20 MEQ PO TBCR
40.0000 meq | EXTENDED_RELEASE_TABLET | Freq: Two times a day (BID) | ORAL | Status: AC
  Administered 2015-03-18 (×2): 40 meq via ORAL
  Filled 2015-03-18 (×2): qty 2

## 2015-03-18 NOTE — Progress Notes (Signed)
4 Days Post-Op Procedure(s) (LRB): CORONARY ARTERY BYPASS GRAFTING (CABG) x 4 (LIMA to LAD, SVG to DIAGONAL 2, SVG SEQUENTIALLY to OM1 and OM2) (N/A) CLIPPING OF ATRIAL APPENDAGE (N/A) TRANSESOPHAGEAL ECHOCARDIOGRAM (TEE) (N/A) Subjective: Feels better today Still frustrated about atrial fib  Objective: Vital signs in last 24 hours: Temp:  [98 F (36.7 C)-99.4 F (37.4 C)] 98 F (36.7 C) (02/12 0750) Pulse Rate:  [56-147] 121 (02/12 0800) Cardiac Rhythm:  [-] Atrial fibrillation (02/12 0800) Resp:  [12-25] 18 (02/12 0800) BP: (82-127)/(56-91) 103/65 mmHg (02/12 0800) SpO2:  [90 %-98 %] 94 % (02/12 0800) Weight:  [182 lb 1.6 oz (82.6 kg)] 182 lb 1.6 oz (82.6 kg) (02/12 0500)  Hemodynamic parameters for last 24 hours:    Intake/Output from previous day: 02/11 0701 - 02/12 0700 In: 907.4 [P.O.:100; I.V.:807.4] Out: 3080 [Urine:3080] Intake/Output this shift: Total I/O In: 41.7 [I.V.:41.7] Out: -   General appearance: alert, cooperative and no distress Neurologic: intact Heart: irregularly irregular rhythm Lungs: diminished breath sounds bibasilar Abdomen: normal findings: soft, non-tender  Lab Results:  Recent Labs  03/17/15 0515 03/18/15 0403  WBC 8.8 7.5  HGB 9.6* 10.0*  HCT 29.0* 30.2*  PLT 132* 171   BMET:  Recent Labs  03/17/15 0515 03/18/15 0403  NA 131* 136  K 4.0 3.8  CL 97* 100*  CO2 29 29  GLUCOSE 112* 106*  BUN 11 9  CREATININE 0.71 0.68  CALCIUM 8.0* 8.1*    PT/INR:  Recent Labs  03/18/15 0403  LABPROT 15.1  INR 1.17   ABG    Component Value Date/Time   PHART 7.362 03/14/2015 1857   HCO3 22.8 03/14/2015 1857   TCO2 23 03/15/2015 1604   ACIDBASEDEF 2.0 03/14/2015 1857   O2SAT 96.0 03/14/2015 1857   CBG (last 3)   Recent Labs  03/17/15 0828 03/17/15 1204 03/17/15 1659  GLUCAP 118* 112* 96    Assessment/Plan: S/P Procedure(s) (LRB): CORONARY ARTERY BYPASS GRAFTING (CABG) x 4 (LIMA to LAD, SVG to DIAGONAL 2, SVG  SEQUENTIALLY to OM1 and OM2) (N/A) CLIPPING OF ATRIAL APPENDAGE (N/A) TRANSESOPHAGEAL ECHOCARDIOGRAM (TEE) (N/A) -  POD # 4  CV- remains in atrial fib although rate better controlled with higher dose of diltiazem  Continue amiodarone and lopressor  Home dose of coumadin 5 on T,Th,Sat, 7.5 on other day- give 7.5 mg today  Has LA appendage clip   RESP- bibasilar atelectasis L>R- continue IS  RENAL- creatinine stable, supplement K  ENDO_ CBG OK- dc checks  Ambulate  Thrombocytopenia resolved   LOS: 6 days    [name] 03/18/2015

## 2015-03-18 NOTE — Progress Notes (Signed)
      [address]       [place] [address]             [phone]       Converted to SR in 70s  BP 97/57 mmHg  Pulse 65  Temp(Src) 98.6 F (37 C) (Oral)  Resp 22  Ht 6' (1.829 m)  Wt 182 lb 1.6 oz (82.6 kg)  BMI 24.69 kg/m2  SpO2 94%   Intake/Output Summary (Last 24 hours) at 03/18/15 1804 Last data filed at 03/18/15 1617  Gross per 24 hour  Intake 1723.9 ml  Output   2750 ml  Net -1026.1 ml    Transfer to 2 west in AM  [place] C. Roxan Hockey, MD [facility] [phone]

## 2015-03-18 NOTE — Progress Notes (Addendum)
    Subjective:  Denies CP or dyspnea   Objective:  Filed Vitals:   03/18/15 0700 03/18/15 0750 03/18/15 0800 03/18/15 0900  BP: 107/70  103/65 88/75  Pulse: 118  121 131  Temp:  98 F (36.7 C)    TempSrc:  Oral    Resp: 17  18 20   Height:      Weight:      SpO2: 94%  94% 96%    Intake/Output from previous day:  Intake/Output Summary (Last 24 hours) at 03/18/15 1040 Last data filed at 03/18/15 0900  Gross per 24 hour  Intake 1372.4 ml  Output   2800 ml  Net -1427.6 ml    Physical Exam: Physical exam: Well-developed well-nourished in no acute distress.  Skin is warm and dry.  HEENT is normal.  Neck is supple.  Chest CTA;  previous sternotomy Cardiovascular exam tachycardic and irregular Abdominal exam nontender or distended. No masses palpated. Extremities show no edema. neuro grossly intact    Lab Results: Basic Metabolic Panel:  Recent Labs  03/15/15 1614  03/17/15 0515 03/18/15 0403  NA  --   < > 131* 136  K  --   < > 4.0 3.8  CL  --   < > 97* 100*  CO2  --   < > 29 29  GLUCOSE  --   < > 112* 106*  BUN  --   < > 11 9  CREATININE 0.82  < > 0.71 0.68  CALCIUM  --   < > 8.0* 8.1*  MG 2.1  --   --   --   < > = values in this interval not displayed. CBC:  Recent Labs  03/17/15 0515 03/18/15 0403  WBC 8.8 7.5  HGB 9.6* 10.0*  HCT 29.0* 30.2*  MCV 93.2 92.9  PLT 132* 171    Assessment/Plan:  1 CAD s/p CABG-continue ASA and statin 2 postoperative atrial fibrillation-patient remains in atrial fibrillation; previously stated his intolerance to amiodarone was photosensitivity and "it didn't work". We will continue amiodarone load, cardizem and metoprolol; coumadin initiated. If atrial fibrillation persists in AM, begin heparin in anticipation of TEE guided DCCV Tuesday; continue heparin until INR therapeutic. On AM of DCCV, hold cardizem and metoprolol. 3 Postoperative volume excess- diurese as tolerated.   Kirk Ruths 03/18/2015, 10:40  AM

## 2015-03-19 LAB — BASIC METABOLIC PANEL
ANION GAP: 5 (ref 5–15)
BUN: 7 mg/dL (ref 6–20)
CALCIUM: 8.2 mg/dL — AB (ref 8.9–10.3)
CO2: 27 mmol/L (ref 22–32)
Chloride: 104 mmol/L (ref 101–111)
Creatinine, Ser: 0.73 mg/dL (ref 0.61–1.24)
GFR calc Af Amer: >60 mL/min (ref >60)
GFR calc non Af Amer: >60 mL/min (ref >60)
GLUCOSE: 100 mg/dL — AB (ref 65–99)
POTASSIUM: 4 mmol/L (ref 3.5–5.1)
Sodium: 136 mmol/L (ref 135–145)

## 2015-03-19 LAB — PROTIME-INR
INR: 1.23 (ref 0.00–1.49)
PROTHROMBIN TIME: 15.7 s — AB (ref 11.6–15.2)

## 2015-03-19 MED ORDER — METOPROLOL TARTRATE 25 MG PO TABS
25.0000 mg | ORAL_TABLET | Freq: Once | ORAL | Status: AC
  Administered 2015-03-19: 25 mg via ORAL
  Filled 2015-03-19: qty 1

## 2015-03-19 MED ORDER — SODIUM CHLORIDE 0.9% FLUSH: 3.0000 mL | INTRAVENOUS | Status: DC | PRN

## 2015-03-19 MED ORDER — MOVING RIGHT ALONG BOOK
Freq: Once | Status: DC
  Filled 2015-03-19: qty 1

## 2015-03-19 MED ORDER — MAGNESIUM HYDROXIDE 400 MG/5ML PO SUSP: 30.0000 mL | Freq: Every day | ORAL | Status: DC | PRN

## 2015-03-19 MED ORDER — HEPARIN (PORCINE) IN NACL 100-0.45 UNIT/ML-% IJ SOLN
1100.0000 [IU]/h | INTRAMUSCULAR | Status: DC
  Administered 2015-03-19: 1000 [IU]/h via INTRAVENOUS
  Filled 2015-03-19: qty 250

## 2015-03-19 MED ORDER — ALUM & MAG HYDROXIDE-SIMETH 200-200-20 MG/5ML PO SUSP: 15.0000 mL | ORAL | Status: DC | PRN

## 2015-03-19 MED ORDER — SODIUM CHLORIDE 0.9 % IV SOLN: 250.0000 mL | INTRAVENOUS | Status: DC | PRN

## 2015-03-19 MED ORDER — GUAIFENESIN-DM 100-10 MG/5ML PO SYRP: 15.0000 mL | ORAL_SOLUTION | ORAL | Status: DC | PRN

## 2015-03-19 MED ORDER — METOPROLOL TARTRATE 50 MG PO TABS
50.0000 mg | ORAL_TABLET | Freq: Two times a day (BID) | ORAL | Status: DC
  Administered 2015-03-19 – 2015-03-20 (×2): 50 mg via ORAL
  Filled 2015-03-19 (×2): qty 1

## 2015-03-19 MED ORDER — SODIUM CHLORIDE 0.9% FLUSH
3.0000 mL | Freq: Two times a day (BID) | INTRAVENOUS | Status: DC
Start: 2015-03-19 — End: 2015-03-23
  Administered 2015-03-19 – 2015-03-22 (×5): 3 mL via INTRAVENOUS

## 2015-03-19 NOTE — Progress Notes (Signed)
03/19/2015 1215 Received pt to room 2w09 from 2S.  Pt is A&O.  Pt without c/o at this time.  Tele monitor applied and CCMD notified.  Oriented pt to room, call light and bed.  Call bell in reach. [place]ney [place]

## 2015-03-19 NOTE — Progress Notes (Signed)
03/19/2015 1512 Pt has converted to AFIB with RVR.  Renee PA notified.  She and Dr. [name] will be up to see pt. Carney Corners

## 2015-03-19 NOTE — Significant Event (Signed)
Patient has a ready bed in 2W09, taken there via wheelchair by RN Colletta Maryland Flippin. All belongings with patient (left hearing aid which patient is wearing). Patient's spouse at bedside. VS stable prior to patient leaving unit. Report given to receiving RN Tye Maryland. Elsye Mccollister,RN.

## 2015-03-19 NOTE — Progress Notes (Signed)
ANTICOAGULATION CONSULT NOTE - Initial Consult  Pharmacy Consult for Heparin Indication: atrial fibrillation  Allergies  Allergen Reactions  . Ace Inhibitors     REACTION: cough  . Amiodarone Hcl     REACTION: intolerance Patient reports photosensitivity    Patient Measurements: Height: 6' (182.9 cm) Weight: 180 lb 8 oz (81.874 kg) IBW/kg (Calculated) : 77.6   Vital Signs: Temp: 98.5 F (36.9 C) (02/13 1220) Temp Source: Axillary (02/13 1220) BP: 141/77 mmHg (02/13 1220) Pulse Rate: 84 (02/13 1220)  Labs:  Recent Labs  03/17/15 0515 03/18/15 0403 03/19/15 0404  HGB 9.6* 10.0*  --   HCT 29.0* 30.2*  --   PLT 132* 171  --   LABPROT  --  15.1 15.7*  INR  --  1.17 1.23  CREATININE 0.71 0.68 0.73    Estimated Creatinine Clearance: 74.1 mL/min (by C-G formula based on Cr of 0.73).   Medical History: Past Medical History  Diagnosis Date  . HYPERLIPIDEMIA 09/29/2006  . ERECTILE DYSFUNCTION 09/29/2006  . 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 ABUSE, ALCOHOL, IN REMISSION 04/08/2007  . Other specified forms of hearing loss 08/10/2009  . HYPERTENSION 09/29/2006  . VENTRICULAR TACHYCARDIA 09/29/2006  . SINUSITIS- ACUTE-NOS 06/23/2007  . ALLERGIC RHINITIS 09/29/2006  . BENIGN PROSTATIC HYPERTROPHY 09/29/2006  . OSTEOARTHROSIS NOS, OTHER [facility] SITE 09/29/2006  . BACK PAIN 09/12/2008  . LEG PAIN, RIGHT 10/02/2008  . VERTIGO 04/08/2007  . COLONIC POLYPS, HX OF 06/23/2007  . CHEST PAIN 07/04/2008  . FATIGUE 11/16/2009  . VIRAL GASTROENTERITIS 01/29/2010  . GLUCOSE INTOLERANCE 03/19/2010  . Atrial flutter ([place]) 03/28/2010    a. s/p prior rfca;  b. recurrent paroxysmal flutter 04/2012;  c. pradaxa initiated 04/2012.  [name] Long term (current) use of anticoagulants 04/26/2010  . Dysrhythmia, cardiac 2012    atrial flutter  . Diverticulosis   . MELANOMA, MALIGNANT, SKIN NOS 09/29/2006    other skin cancers -no further melanoma  . Hearing loss     hard of hearing       Assessment: Recurrent Afib RVR s/p CABG post op day 5.  Warfarin started post op but INR 1.23.  Plan to anticoagulate with heparin for possible DCCV.   Will run on low end therapeutic goal to prevent post op bleeding.  CBC stable   Goal of Therapy:  INR 2-3 Heparin level 0.3-0.5 units/ml Monitor platelets by anticoagulation protocol: Yes   Plan:   Warfarin per TCTS post op Start heparin drip - no bolus - 1000 uts/hr Check HL 6hr after start Daily HL, CBC, Protime  Bonnita Nasuti Pharm.D. CPP, BCPS Clinical Pharmacist [phone] 03/19/2015 4:33 PM

## 2015-03-19 NOTE — Progress Notes (Signed)
5 Days Post-Op Procedure(s) (LRB): CORONARY ARTERY BYPASS GRAFTING (CABG) x 4 (LIMA to LAD, SVG to DIAGONAL 2, SVG SEQUENTIALLY to OM1 and OM2) (N/A) CLIPPING OF ATRIAL APPENDAGE (N/A) TRANSESOPHAGEAL ECHOCARDIOGRAM (TEE) (N/A) Subjective: No complaints this morning  Objective: Vital signs in last 24 hours: Temp:  [97.3 F (36.3 C)-98.6 F (37 C)] 97.8 F (36.6 C) (02/13 0415) Pulse Rate:  [59-131] 80 (02/13 0600) Cardiac Rhythm:  [-] Normal sinus rhythm (02/13 0600) Resp:  [14-22] 16 (02/13 0600) BP: (88-126)/(57-90) 126/73 mmHg (02/13 0600) SpO2:  [91 %-99 %] 92 % (02/13 0600) Weight:  [175 lb 11.3 oz (79.7 kg)] 175 lb 11.3 oz (79.7 kg) (02/13 0500)  Hemodynamic parameters for last 24 hours:    Intake/Output from previous day: 02/12 0701 - 02/13 0700 In: 1368.5 [P.O.:1160; I.V.:208.5] Out: 2825 [Urine:2825] Intake/Output this shift:    General appearance: alert, cooperative and no distress Neurologic: intact Heart: regular rate and rhythm Lungs: diminished breath sounds bibasilar Abdomen: normal findings: soft, non-tender Wound: clean and dry  Lab Results:  Recent Labs  03/17/15 0515 03/18/15 0403  WBC 8.8 7.5  HGB 9.6* 10.0*  HCT 29.0* 30.2*  PLT 132* 171   BMET:  Recent Labs  03/18/15 0403 03/19/15 0404  NA 136 136  K 3.8 4.0  CL 100* 104  CO2 29 27  GLUCOSE 106* 100*  BUN 9 7  CREATININE 0.68 0.73  CALCIUM 8.1* 8.2*    PT/INR:  Recent Labs  03/19/15 0404  LABPROT 15.7*  INR 1.23   ABG    Component Value Date/Time   PHART 7.362 03/14/2015 1857   HCO3 22.8 03/14/2015 1857   TCO2 23 03/15/2015 1604   ACIDBASEDEF 2.0 03/14/2015 1857   O2SAT 96.0 03/14/2015 1857   CBG (last 3)   Recent Labs  03/17/15 1659 03/17/15 2128 03/18/15 0748  GLUCAP 96 110* 99    Assessment/Plan: S/P Procedure(s) (LRB): CORONARY ARTERY BYPASS GRAFTING (CABG) x 4 (LIMA to LAD, SVG to DIAGONAL 2, SVG SEQUENTIALLY to OM1 and OM2) (N/A) CLIPPING OF ATRIAL  APPENDAGE (N/A) TRANSESOPHAGEAL ECHOCARDIOGRAM (TEE) (N/A) Plan for transfer to step-down: see transfer orders  CV- in SR on PO amiodarone and lopressor- will change lopressor to BID  Continue coumadin- give 7.5 mg again today  RESP- continue IS  RENAL- creatinine and lytes ok  ENDO- CBG well controlled  Increase ambulation   LOS: 7 days    [name] 03/19/2015

## 2015-03-19 NOTE — Progress Notes (Signed)
    Subjective:  Recurrent dyspnea with subseqeunt demonstration of recurrent rapid Afib    Objective:  Filed Vitals:   03/19/15 1000 03/19/15 1100 03/19/15 1200 03/19/15 1220  BP: 97/62 111/72 132/69 141/77  Pulse: 67 72 78 84  Temp:  97.6 F (36.4 C)  98.5 F (36.9 C)  TempSrc:    Axillary  Resp: 16 16 19 16   Height:      Weight:    180 lb 8 oz (81.874 kg)  SpO2: 97% 97% 98% 98%    Intake/Output from previous day:  Intake/Output Summary (Last 24 hours) at 03/19/15 1542 Last data filed at 03/19/15 1100  Gross per 24 hour  Intake    810 ml  Output   1975 ml  Net  -1165 ml    Physical Exam: Physical exam: Well-developed well-nourished in no acute distress.  Skin is warm and dry.  HEENT is normal.  Neck is supple.  Chest with diminished BS; mild exp wheeze; previous sternotomy Cardiovascular exam tachycardic and irregular Abdominal exam nontender or distended. No masses palpated. Extremities show no edema. neuro grossly intact    Lab Results: Basic Metabolic Panel:  Recent Labs  03/18/15 0403 03/19/15 0404  NA 136 136  K 3.8 4.0  CL 100* 104  CO2 29 27  GLUCOSE 106* 100*  BUN 9 7  CREATININE 0.68 0.73  CALCIUM 8.1* 8.2*   CBC:  Recent Labs  03/17/15 0515 03/18/15 0403  WBC 8.8 7.5  HGB 9.6* 10.0*  HCT 29.0* 30.2*  MCV 93.2 92.9  PLT 132* 171    Assessment/Plan:  1 CAD s/p CABG-continue ASA and statin 2 Afib RVR 3dyspnea on exertion   Will continue metoprolol and give extra dose today Will increase amio loading to 400 q8 Will antipcate DCCV on Wed if not converted spontaneously prior to that Will begin on LMWH to assure anticoagulation  Continue coumadin BP would allow adjunctive CCB but....     Virl Axe 03/19/2015, 3:42 PM

## 2015-03-19 NOTE — Care Management Important Message (Signed)
Important Message  Patient Details  Name: Jonathan Orozco MRN: [id number] Date of Birth: 05-Sep-1928   Medicare Important Message Given:  Yes    Kayela Humphres P Ciclaly Mulcahey 03/19/2015, 2:03 PM

## 2015-03-19 NOTE — Progress Notes (Signed)
Utilization review completed.  

## 2015-03-19 NOTE — Progress Notes (Addendum)
CARDIAC REHAB PHASE I   PRE:  Rate/Rhythm: 19 SR c/ PACs  BP:  Sitting: 147/77        SaO2: 97 2L  MODE:  Ambulation: 180 ft   POST:  Rate/Rhythm: 160-190 a.fib  BP:  Sitting: 145/91         SaO2: 95 2L  Pt in bed, eager to walk, had difficulty standing, needed assistance, cues for sternal precautions. Pt ambulated 180 ft on 2L O2, rolling walker, slow, mildly unsteady gait, tolerated fair. Pt c/o DOE, fatigue, standing rest x2. Upon returning to room, pt's heart rate noted to be in the 160s-190 a.fib RVR. RN notified. Pt required significant assistance to return to bed as well, needed additional reminders of sternal precautions. Pt seems to be forgetful at times, generally weak. Depending on pt progress, pt may benefit from PT consult. RN aware. Pt to bed per pt request after walk, call bell within reach. Will follow.   [phone]  Lenna Sciara, RN, BSN 03/19/2015 2:56 PM

## 2015-03-20 LAB — CBC
HEMATOCRIT: 34.7 % — AB (ref 39.0–52.0)
HEMOGLOBIN: 11.2 g/dL — AB (ref 13.0–17.0)
MCH: 30.4 pg (ref 26.0–34.0)
MCHC: 32.3 g/dL (ref 30.0–36.0)
MCV: 94 fL (ref 78.0–100.0)
Platelets: 247 10*3/uL (ref 150–400)
RBC: 3.69 MIL/uL — AB (ref 4.22–5.81)
RDW: 13.6 % (ref 11.5–15.5)
WBC: 8.2 10*3/uL (ref 4.0–10.5)

## 2015-03-20 LAB — BASIC METABOLIC PANEL
ANION GAP: 9 (ref 5–15)
BUN: 10 mg/dL (ref 6–20)
CHLORIDE: 97 mmol/L — AB (ref 101–111)
CO2: 25 mmol/L (ref 22–32)
Calcium: 7.9 mg/dL — ABNORMAL LOW (ref 8.9–10.3)
Creatinine, Ser: 0.74 mg/dL (ref 0.61–1.24)
GFR calc non Af Amer: >60 mL/min (ref >60)
GLUCOSE: 101 mg/dL — AB (ref 65–99)
POTASSIUM: 3.8 mmol/L (ref 3.5–5.1)
Sodium: 131 mmol/L — ABNORMAL LOW (ref 135–145)

## 2015-03-20 LAB — PROTIME-INR
INR: 5.28 (ref 0.00–1.49)
INR: 1.51 — AB (ref 0.00–1.49)
Prothrombin Time: 46.8 seconds — ABNORMAL HIGH (ref 11.6–15.2)
Prothrombin Time: 18.3 seconds — ABNORMAL HIGH (ref 11.6–15.2)

## 2015-03-20 LAB — HEPARIN LEVEL (UNFRACTIONATED)
HEPARIN UNFRACTIONATED: 0.25 [IU]/mL — AB (ref 0.30–0.70)
Heparin Unfractionated: 0.29 IU/mL — ABNORMAL LOW (ref 0.30–0.70)

## 2015-03-20 MED ORDER — HYDROCORTISONE 1 % EX CREA
TOPICAL_CREAM | Freq: Two times a day (BID) | CUTANEOUS | Status: DC
  Administered 2015-03-20: 14:00:00 via TOPICAL
  Administered 2015-03-20 – 2015-03-21 (×2): 1 via TOPICAL
  Administered 2015-03-21 – 2015-03-22 (×2): via TOPICAL
  Administered 2015-03-22: 1 via TOPICAL
  Filled 2015-03-20: qty 28

## 2015-03-20 MED ORDER — METOPROLOL TARTRATE 1 MG/ML IV SOLN
5.0000 mg | Freq: Once | INTRAVENOUS | Status: AC
  Administered 2015-03-20: 5 mg via INTRAVENOUS
  Filled 2015-03-20: qty 5

## 2015-03-20 MED ORDER — HEPARIN (PORCINE) IN NACL 100-0.45 UNIT/ML-% IJ SOLN
1350.0000 [IU]/h | INTRAMUSCULAR | Status: DC
  Administered 2015-03-20: 1100 [IU]/h via INTRAVENOUS
  Administered 2015-03-22: 1350 [IU]/h via INTRAVENOUS
  Filled 2015-03-20 (×3): qty 250

## 2015-03-20 MED ORDER — DIGOXIN 0.25 MG/ML IJ SOLN
0.2500 mg | Freq: Once | INTRAMUSCULAR | Status: AC
  Administered 2015-03-20: 0.25 mg via INTRAVENOUS
  Filled 2015-03-20: qty 1

## 2015-03-20 MED ORDER — AMIODARONE HCL IN DEXTROSE 360-4.14 MG/200ML-% IV SOLN: 60.0000 mg/h | INTRAVENOUS | Status: AC

## 2015-03-20 MED ORDER — SODIUM CHLORIDE 0.9 % IV BOLUS (SEPSIS): 1000.0000 mL | Freq: Once | INTRAVENOUS | Status: DC

## 2015-03-20 MED ORDER — AMIODARONE HCL 200 MG PO TABS
400.0000 mg | ORAL_TABLET | Freq: Three times a day (TID) | ORAL | Status: DC
  Administered 2015-03-20 – 2015-03-23 (×10): 400 mg via ORAL
  Filled 2015-03-20 (×10): qty 2

## 2015-03-20 MED ORDER — METOPROLOL TARTRATE 50 MG PO TABS
50.0000 mg | ORAL_TABLET | Freq: Three times a day (TID) | ORAL | Status: DC
Start: 2015-03-20 — End: 2015-03-23
  Administered 2015-03-20 – 2015-03-22 (×8): 50 mg via ORAL
  Filled 2015-03-20 (×10): qty 1

## 2015-03-20 NOTE — Progress Notes (Signed)
ANTICOAGULATION CONSULT NOTE - Follow-up Consult  Pharmacy Consult for Heparin Indication: atrial fibrillation  Allergies  Allergen Reactions  . Ace Inhibitors     REACTION: cough  . Amiodarone Hcl     REACTION: intolerance Patient reports photosensitivity    Patient Measurements: Height: 6' (182.9 cm) Weight: 179 lb 12.8 oz (81.557 kg) IBW/kg (Calculated) : 77.6   Vital Signs: Temp: 98.3 F (36.8 C) (02/14 2003) Temp Source: Oral (02/14 2003) BP: 137/82 mmHg (02/14 2003) Pulse Rate: 133 (02/14 2003)  Labs:  Recent Labs  03/18/15 0403 03/19/15 0404 03/19/15 2355 03/20/15 0537 03/20/15 1039 03/20/15 2157  HGB 10.0*  --   --  11.2*  --   --   HCT 30.2*  --   --  34.7*  --   --   PLT 171  --   --  247  --   --   LABPROT 15.1 15.7*  --  46.8* 18.3*  --   INR 1.17 1.23  --  5.28* 1.51*  --   HEPARINUNFRC  --   --  0.29*  --   --  0.25*  CREATININE 0.68 0.73  --  0.74  --   --     Estimated Creatinine Clearance: 74.1 mL/min (by C-G formula based on Cr of 0.74).   Assessment: 82 y.o. M on heparin for recurrent Afib RVR. Noted pt is s/p CABG post op day 6.  Warfarin started post and INR was reported as 5.28 this am and heparin was stopped.  Repeat INR is 1.51. Patient noted for cardioversion on 2/15. -Hg= 11.2, plt= 247 Heparin drip restarted 1100 uts/hr HL 0.25 < goal   Goal of Therapy:  INR 2-3 Heparin level 0.3-0.5 units/ml Monitor platelets by anticoagulation protocol: Yes   Plan:   Increase  heparin at 1250 units/hr -Heparin level  daily wth CBC daily  Bonnita Nasuti Pharm.D. CPP, BCPS Clinical Pharmacist [phone] 03/20/2015 10:34 PM

## 2015-03-20 NOTE — Progress Notes (Signed)
Pt IV site infiltrated when bolus was started. Pt stated that he is refusing another IV site and to just give him him another dose of Amiodarone. MD on call was notified and new orders given. Arthor Captain LPN

## 2015-03-20 NOTE — Progress Notes (Addendum)
      [phone]       [place] [address]             [phone]      6 Days Post-Op Procedure(s) (LRB): CORONARY ARTERY BYPASS GRAFTING (CABG) x 4 (LIMA to LAD, SVG to DIAGONAL 2, SVG SEQUENTIALLY to OM1 and OM2) (N/A) CLIPPING OF ATRIAL APPENDAGE (N/A) TRANSESOPHAGEAL ECHOCARDIOGRAM (TEE) (N/A)   Subjective:  Mr. Church states he doesn't feel that great.  He thinks this is from being in Atrial Fibrillation.  Objective: Vital signs in last 24 hours: Temp:  [97.6 F (36.4 C)-98.5 F (36.9 C)] 98.2 F (36.8 C) (02/14 0429) Pulse Rate:  [67-147] 102 (02/14 0547) Cardiac Rhythm:  [-] Atrial fibrillation (02/13 1900) Resp:  [16-20] 20 (02/14 0429) BP: (97-141)/(62-81) 108/81 mmHg (02/14 0547) SpO2:  [96 %-98 %] 96 % (02/14 0429) Weight:  [179 lb 12.8 oz (81.557 kg)-180 lb 8 oz (81.874 kg)] 179 lb 12.8 oz (81.557 kg) (02/14 0448)  Intake/Output from previous day: 02/13 0701 - 02/14 0700 In: 603 [P.O.:600; I.V.:3] Out: 1750 [Urine:1750]  General appearance: alert, cooperative and no distress Heart: irregularly irregular rhythm Lungs: clear to auscultation bilaterally Abdomen: soft, non-tender; bowel sounds normal; no masses,  no organomegaly Extremities: edema none appreciated Wound: clean and dry  Lab Results:  Recent Labs  03/18/15 0403 03/20/15 0537  WBC 7.5 8.2  HGB 10.0* 11.2*  HCT 30.2* 34.7*  PLT 171 247   BMET:  Recent Labs  03/19/15 0404 03/20/15 0537  NA 136 131*  K 4.0 3.8  CL 104 97*  CO2 27 25  GLUCOSE 100* 101*  BUN 7 10  CREATININE 0.73 0.74  CALCIUM 8.2* 7.9*    PT/INR:  Recent Labs  03/20/15 0537  LABPROT 46.8*  INR 5.28*   ABG    Component Value Date/Time   PHART 7.362 03/14/2015 1857   HCO3 22.8 03/14/2015 1857   TCO2 23 03/15/2015 1604   ACIDBASEDEF 2.0 03/14/2015 1857   O2SAT 96.0 03/14/2015 1857   CBG (last 3)   Recent Labs  03/17/15 1659 03/17/15 2128 03/18/15 0748  GLUCAP 96 110* 99     Assessment/Plan: S/P Procedure(s) (LRB): CORONARY ARTERY BYPASS GRAFTING (CABG) x 4 (LIMA to LAD, SVG to DIAGONAL 2, SVG SEQUENTIALLY to OM1 and OM2) (N/A) CLIPPING OF ATRIAL APPENDAGE (N/A) TRANSESOPHAGEAL ECHOCARDIOGRAM (TEE) (N/A)  1. CV- A. Fib, rate in the 130s- Amiodarone increased to 400 TID, will try Lopressor at 50 mg TID 2. INR 5.28, will hold coumadin tonight, per patient takes 5mg  daily at home except for 2 days which they were trying 7.5 mg, however pre admission INR was high 3. Pulm- wean oxygen as tolerated, continue IS 4. Renal- creatinine WNL, weight is at baseline, no lasix indicated at this time 5. Dispo- patient remains in Atrial Fib, rate in the 130s- will increase Lopressor to TID, hold coumadin, plan for cardioversion tomorrow   LOS: 8 days    BARRETT, ERIN 03/20/2015  Patient seen and examined, agree with above I am inclined to believe INR is a spurious result. That is an awfully dramatic change in one day with a dose he has been on at home Will repeat INR  Remo Lipps C. Roxan Hockey, MD [facility] [phone]

## 2015-03-20 NOTE — Progress Notes (Signed)
CARDIAC REHAB PHASE I   Second attempt to ambulate with pt today. Pt received digoxin for elevated heart rate, pt HR remains elevated 130's-140's a.fib. Pt anxious per RN. Will hold ambulation today and follow-up tomorrow.   Lenna Sciara, RN, BSN 03/20/2015 2:27 PM

## 2015-03-20 NOTE — Progress Notes (Signed)
Patient ID: HOUGHTON ABELAR, male   DOB: 1929/01/14, 82 y.o.   MRN: [id number]    Patient Name: Jonathan Orozco Date of Encounter: 03/20/2015     Active Problems:   Hyperlipidemia   VENTRICULAR TACHYCARDIA   Atrial flutter/Fibrillation   Long term (current) use of anticoagulants   Heme positive stool   Exertional angina (HCC)   Crescendo angina (HCC)   Paroxysmal atrial fibrillation (HCC)   CAD (coronary artery disease), native coronary artery   CAD (coronary artery disease)    SUBJECTIVE  Called to see patient for atrial fib with a RVR. He is not symptomatic except maybe a bit more dyspnea. No chest pain.   CURRENT MEDS . amiodarone  400 mg Oral TID  . aspirin EC  81 mg Oral Daily   Or  . aspirin  81 mg Per Tube Daily  . atorvastatin  10 mg Oral Daily  . bisacodyl  10 mg Oral Daily   Or  . bisacodyl  10 mg Rectal Daily  . digoxin  0.25 mg Intravenous Once  . docusate sodium  200 mg Oral Daily  . hydrocortisone cream   Topical BID  . metoprolol tartrate  50 mg Oral TID  . moving right along book   Does not apply Once  . multivitamin with minerals  1 tablet Oral Daily  . omega-3 acid ethyl esters  1 g Oral Daily  . sodium chloride flush  3 mL Intravenous Q12H  . warfarin  7.5 mg Oral q1800  . Warfarin - Physician Dosing Inpatient   Does not apply q1800    OBJECTIVE  Filed Vitals:   03/20/15 0448 03/20/15 0547 03/20/15 1022 03/20/15 1203  BP:  108/81 114/75 106/73  Pulse:  102 137   Temp:      TempSrc:      Resp:      Height:      Weight: 179 lb 12.8 oz (81.557 kg)     SpO2:   97%     Intake/Output Summary (Last 24 hours) at 03/20/15 1215 Last data filed at 03/20/15 0848  Gross per 24 hour  Intake    243 ml  Output   1300 ml  Net  -1057 ml   Filed Weights   03/19/15 0500 03/19/15 1220 03/20/15 0448  Weight: 175 lb 11.3 oz (79.7 kg) 180 lb 8 oz (81.874 kg) 179 lb 12.8 oz (81.557 kg)    PHYSICAL EXAM  General: Pleasant, NAD. Neuro: Alert and oriented X  3. Moves all extremities spontaneously. Psych: Normal affect. HEENT:  Normal  Neck: Supple without bruits or JVD. Lungs:  Resp regular and unlabored, CTA. Heart: IRI tachyR no s3, s4, or murmurs. Abdomen: Soft, non-tender, non-distended, BS + x 4.  Extremities: No clubbing, cyanosis or edema. DP/PT/Radials 2+ and equal bilaterally.  Accessory Clinical Findings  CBC  Recent Labs  03/18/15 0403 03/20/15 0537  WBC 7.5 8.2  HGB 10.0* 11.2*  HCT 30.2* 34.7*  MCV 92.9 94.0  PLT 171 [id number]   Basic Metabolic Panel  Recent Labs  03/19/15 0404 03/20/15 0537  NA 136 131*  K 4.0 3.8  CL 104 97*  CO2 27 25  GLUCOSE 100* 101*  BUN 7 10  CREATININE 0.73 0.74  CALCIUM 8.2* 7.9*   Liver Function Tests No results for input(s): AST, ALT, ALKPHOS, BILITOT, PROT, ALBUMIN in the last 72 hours. No results for input(s): LIPASE, AMYLASE in the last 72 hours. Cardiac Enzymes No results for input(s):  CKTOTAL, CKMB, CKMBINDEX, TROPONINI in the last 72 hours. BNP Invalid input(s): POCBNP D-Dimer No results for input(s): DDIMER in the last 72 hours. Hemoglobin A1C No results for input(s): HGBA1C in the last 72 hours. Fasting Lipid Panel No results for input(s): CHOL, HDL, LDLCALC, TRIG, CHOLHDL, LDLDIRECT in the last 72 hours. Thyroid Function Tests No results for input(s): TSH, T4TOTAL, T3FREE, THYROIDAB in the last 72 hours.  Invalid input(s): FREET3  TELE  Atrial fib with a RVR  Radiology/Studies  Dg Chest Port 1 View  03/18/2015  CLINICAL DATA:  82 year old male status post CABG.  Atelectasis. EXAM: PORTABLE CHEST 1 VIEW COMPARISON:  03/17/2015 and prior exams FINDINGS: A right IJ central venous catheter sheath, CABG changes and left atrial clip again noted. Left lower lung consolidation/ atelectasis and mild right basilar atelectasis are unchanged. There is no evidence of pneumothorax. Little significant change is noted since the prior study. IMPRESSION: Little significant change  with continued left lower lung consolidation/ atelectasis and mild right basilar atelectasis. No evidence of pneumothorax. Electronically Signed   By: Margarette Canada M.D.   On: 03/18/2015 08:03   Dg Chest Port 1 View  03/17/2015  CLINICAL DATA:  Atelectasis EXAM: PORTABLE CHEST 1 VIEW COMPARISON:  Yesterday FINDINGS: Stable cardiopericardial enlargement. Stable aortic contours. The patient is status post CABG and left atrial exclusion. Right IJ catheter in stable position. Layering left pleural effusion with presumed overlying atelectasis. Interstitial coarsening is increased from prior. No pneumothorax. IMPRESSION: 1. Increased venous congestion. 2. Unchanged left basilar atelectasis and effusion. Electronically Signed   By: Monte Fantasia M.D.   On: 03/17/2015 07:38   Dg Chest Port 1 View  03/16/2015  CLINICAL DATA:  Status post CABG 2 days ago; shortness of breath today. EXAM: PORTABLE CHEST 1 VIEW COMPARISON:  Portable chest x-ray of March 15, 2015 FINDINGS: The lungs are adequately inflated. There is no pneumothorax or pneumomediastinum. There is persistent left lower lobe atelectasis. There is persistent obscuration of the hemidiaphragms. The pulmonary interstitial markings are slightly more conspicuous today. The cardiac silhouette remains enlarged. The sternal wires are intact. The left atrial appendage clip is unchanged. The chest tubes have been removed as has the Swan-Ganz catheter. The right internal jugular Cordis sheath is unchanged with the tip projecting over the proximal SVC. IMPRESSION: Slight interval increased prominence of the pulmonary interstitial markings especially on the left. Persistent left lower lobe atelectasis and small left pleural effusion. Minimal subsegmental right and perihilar and infrahilar atelectasis. No pneumothorax. Electronically Signed   By: David  Martinique M.D.   On: 03/16/2015 07:43   Dg Chest Port 1 View  03/15/2015  CLINICAL DATA:  Status post CABG EXAM: PORTABLE  CHEST 1 VIEW COMPARISON:  Portable chest x-ray of March 14, 2015 FINDINGS: There has been interval extubation of the trachea and of the esophagus. The lungs are reasonably well expanded. There is a small left pleural effusion. There is no pneumothorax. The heart is top-normal in size. The left atrial appendage clip is unchanged in appearance. The sternal wires are intact. The pulmonary vascularity is not engorged. There is retrocardiac subsegmental atelectasis on the left. Patchy subsegmental atelectasis on the right in the infrahilar region persists. The Swan-Ganz catheter tip appears wedged in a lower lobe pulmonary artery on the right. The bilateral chest tubes are in stable position. IMPRESSION: Persistent left lower lobe atelectasis and trace left pleural effusion. Right infrahilar atelectasis as well. Status post extubation of the trachea. The Swan-Ganz catheter appears wedged in  a right lower lobe pulmonary artery. Electronically Signed   By: David  Martinique M.D.   On: 03/15/2015 07:40   Dg Chest Port 1 View  03/14/2015  CLINICAL DATA:  Pneumothorax. EXAM: PORTABLE CHEST 1 VIEW COMPARISON:  March 04, 2015. FINDINGS: Stable cardiomediastinal silhouette. Status post coronary artery bypass graft. Endotracheal tube is in grossly good position, with distal tip 5 cm above the carina. Nasogastric tube tip is seen in proximal stomach. Right internal jugular Swan-Ganz catheter is noted with distal tip in expected position of main pulmonary artery. Bilateral chest tubes appear to be present, without evidence of pneumothorax. Mild bibasilar atelectasis is noted. IMPRESSION: Status post coronary artery bypass graft. Endotracheal and nasogastric tubes are in grossly good position. No pneumothorax is noted. Bibasilar atelectasis is noted. Electronically Signed   By: Marijo Conception, M.D.   On: 03/14/2015 14:44   Dg Chest Port 1 View  03/04/2015  CLINICAL DATA:  Heart palpitations. EXAM: PORTABLE CHEST 1 VIEW  COMPARISON:  11/10/2014. FINDINGS: 1430 hours. Low volume film without focal airspace consolidation, pulmonary edema, or pleural effusion. Cardiopericardial silhouette is at upper limits of normal for size. The visualized bony structures of the thorax are intact. Telemetry leads overlie the chest. IMPRESSION: No acute cardiopulmonary findings. Electronically Signed   By: Misty Stanley M.D.   On: 03/04/2015 14:40    ASSESSMENT AND PLAN  1. Atrial fib with a RVR 2. S/p CABG 3. HTN Rec: he will continue amio 400 tid, continue IV lopressor. Will give a dose of IV digoxin. Will plan to DCCV tomorrow if still out of rhythm. His INR was not therapeutic but he is receiving heparin which will be continued.  Gregg Taylor,M.D.  03/20/2015 12:15 PM

## 2015-03-20 NOTE — Progress Notes (Signed)
MD on call notified that pt HR has been running 150's -170's pt is asymptomatic bp 117/79 new orders received will continue to monitor. Arthor Captain LPN

## 2015-03-20 NOTE — Progress Notes (Signed)
ANTICOAGULATION CONSULT NOTE - Follow-up Consult  Pharmacy Consult for Heparin Indication: atrial fibrillation  Allergies  Allergen Reactions  . Ace Inhibitors     REACTION: cough  . Amiodarone Hcl     REACTION: intolerance Patient reports photosensitivity    Patient Measurements: Height: 6' (182.9 cm) Weight: 180 lb 8 oz (81.874 kg) IBW/kg (Calculated) : 77.6   Vital Signs: Temp: 97.9 F (36.6 C) (02/13 2229) Temp Source: Oral (02/13 2229) BP: 116/77 mmHg (02/13 2229) Pulse Rate: 147 (02/13 2229)  Labs:  Recent Labs  03/17/15 0515 03/18/15 0403 03/19/15 0404 03/19/15 2355  HGB 9.6* 10.0*  --   --   HCT 29.0* 30.2*  --   --   PLT 132* 171  --   --   LABPROT  --  15.1 15.7*  --   INR  --  1.17 1.23  --   HEPARINUNFRC  --   --   --  0.29*  CREATININE 0.71 0.68 0.73  --     Estimated Creatinine Clearance: 74.1 mL/min (by C-G formula based on Cr of 0.73).   Assessment: 82 y.o. M on heparin for recurrent Afib RVR. Noted pt is s/p CABG post op day 6.  Warfarin started post op but INR subtherapeutic.  Plan to anticoagulate with heparin for possible DCCV. Heparin level 0.29 (slightly subtherapeutic) on 1000 units/hr. Plan to aim for low end therapeutic goal to prevent post op bleeding. No issues with line or bleeding reported per RN.  Goal of Therapy:  INR 2-3 Heparin level 0.3-0.5 units/ml Monitor platelets by anticoagulation protocol: Yes   Plan:  Increase heparin drip to 1100 units/hr F/u 8 hr heparin level  Sherlon Handing, PharmD, BCPS Clinical pharmacist, pager [phone] 03/20/2015 12:39 AM

## 2015-03-20 NOTE — Progress Notes (Signed)
ANTICOAGULATION CONSULT NOTE - Follow-up Consult  Pharmacy Consult for Heparin Indication: atrial fibrillation  Allergies  Allergen Reactions  . Ace Inhibitors     REACTION: cough  . Amiodarone Hcl     REACTION: intolerance Patient reports photosensitivity    Patient Measurements: Height: 6' (182.9 cm) Weight: 179 lb 12.8 oz (81.557 kg) IBW/kg (Calculated) : 77.6   Vital Signs: Temp: 98.2 F (36.8 C) (02/14 0429) Temp Source: Oral (02/14 0429) BP: 115/76 mmHg (02/14 1220) Pulse Rate: 137 (02/14 1022)  Labs:  Recent Labs  03/18/15 0403 03/19/15 0404 03/19/15 2355 03/20/15 0537 03/20/15 1039  HGB 10.0*  --   --  11.2*  --   HCT 30.2*  --   --  34.7*  --   PLT 171  --   --  247  --   LABPROT 15.1 15.7*  --  46.8* 18.3*  INR 1.17 1.23  --  5.28* 1.51*  HEPARINUNFRC  --   --  0.29*  --   --   CREATININE 0.68 0.73  --  0.74  --     Estimated Creatinine Clearance: 74.1 mL/min (by C-G formula based on Cr of 0.74).   Assessment: 82 y.o. M on heparin for recurrent Afib RVR. Noted pt is s/p CABG post op day 6.  Warfarin started post and INR was reported as 5.28 this am and heparin was stopped.  Repeat INR is 1.51. Patient noted for cardioversion on 2/15. -Hg= 11.2, plt= 247 -last heprin level was 0.29 and heparin was increased to 1100 units/hr  Goal of Therapy:  INR 2-3 Heparin level 0.3-0.5 units/ml Monitor platelets by anticoagulation protocol: Yes   Plan:  -Spoke with Dr. Roxan Hockey and will resume heparin  -restart heparin at 1100 units/hr -Heparin level in 8 hours and daily wth CBC daily  Hildred Laser, Pharm D 03/20/2015 1:14 PM

## 2015-03-20 NOTE — Progress Notes (Signed)
CRITICAL VALUE ALERT  Critical value received:  INR 5.28 PT46.8  Date of notification:  03/20/15  Time of notification:  0700  Critical value read back:yes  Nurse who received alert:  Arthor Captain LPN  MD notified (1st page):  Dr. Nils Pyle  Time of first page:  [phone]  MD notified (2nd page):  Time of second page:  Responding MD:  Dr Nils Pyle  Time MD responded:  [phone]

## 2015-03-21 DIAGNOSIS — I5030 Unspecified diastolic (congestive) heart failure: Secondary | ICD-10-CM

## 2015-03-21 LAB — CBC
HEMATOCRIT: 32.3 % — AB (ref 39.0–52.0)
Hemoglobin: 10.9 g/dL — ABNORMAL LOW (ref 13.0–17.0)
MCH: 32.1 pg (ref 26.0–34.0)
MCHC: 33.7 g/dL (ref 30.0–36.0)
MCV: 95 fL (ref 78.0–100.0)
PLATELETS: 254 10*3/uL (ref 150–400)
RBC: 3.4 MIL/uL — AB (ref 4.22–5.81)
RDW: 13.9 % (ref 11.5–15.5)
WBC: 7.9 10*3/uL (ref 4.0–10.5)

## 2015-03-21 LAB — HEPARIN LEVEL (UNFRACTIONATED)
HEPARIN UNFRACTIONATED: 0.26 [IU]/mL — AB (ref 0.30–0.70)
HEPARIN UNFRACTIONATED: 0.49 [IU]/mL (ref 0.30–0.70)

## 2015-03-21 LAB — PROTIME-INR
INR: 1.76 — AB (ref 0.00–1.49)
Prothrombin Time: 20.5 seconds — ABNORMAL HIGH (ref 11.6–15.2)

## 2015-03-21 MED ORDER — ALPRAZOLAM 0.5 MG PO TABS
0.5000 mg | ORAL_TABLET | Freq: Every day | ORAL | Status: DC | PRN
  Administered 2015-03-21 – 2015-03-23 (×3): 0.5 mg via ORAL
  Filled 2015-03-21 (×3): qty 1

## 2015-03-21 NOTE — Progress Notes (Signed)
CARDIAC REHAB PHASE I   PRE:  Rate/Rhythm: 104-140 afib  BP:  Supine:   Sitting: 93/58  Standing:    SaO2: 94%RA  MODE:  Ambulation: 68 ft   POST:  Rate/Rhythm: 115-119 afib  Watched rhythm during walk  BP:  Supine:   Sitting: 91/53  Standing:    SaO2: 99%RA 1111-1145 Pt very anxious and needed encouragement to try to walk. Pt walked 68 ft on RA with gait belt use, rolling walker and asst x1. Encouraged pt to stand upright and try to go as far as he could as he wanted to return to room very soon. Pt kept saying how he went to far the other day. Discussed with pt that this is a new day and that I was watching rhythm as we walked. Rate actually better with activity. To recliner after walk and wife in room. Emotional support given. No c/o dizziness with walk just generalized weakness.   Graylon Good, RN BSN  03/21/2015 11:41 AM

## 2015-03-21 NOTE — Progress Notes (Signed)
ANTICOAGULATION CONSULT NOTE - Follow-up Consult  Pharmacy Consult for Heparin Indication: atrial fibrillation  Allergies  Allergen Reactions  . Ace Inhibitors     REACTION: cough  . Amiodarone Hcl     REACTION: intolerance Patient reports photosensitivity    Patient Measurements: Height: 6' (182.9 cm) Weight: 179 lb 1.6 oz (81.239 kg) IBW/kg (Calculated) : 77.6   Vital Signs: Temp: 98.5 F (36.9 C) (02/15 1958) Temp Source: Oral (02/15 1958) BP: 91/52 mmHg (02/15 1958) Pulse Rate: 69 (02/15 1958)  Labs:  Recent Labs  03/19/15 0404  03/20/15 0537 03/20/15 1039 03/20/15 2157 03/21/15 0533 03/21/15 1825  HGB  --   --  11.2*  --   --  10.9*  --   HCT  --   --  34.7*  --   --  32.3*  --   PLT  --   --  247  --   --  254  --   LABPROT 15.7*  --  46.8* 18.3*  --  20.5*  --   INR 1.23  --  5.28* 1.51*  --  1.76*  --   HEPARINUNFRC  --   < >  --   --  0.25* 0.26* 0.49  CREATININE 0.73  --  0.74  --   --   --   --   < > = values in this interval not displayed.  Estimated Creatinine Clearance: 74.1 mL/min (by C-G formula based on Cr of 0.74).   Assessment: 82 y.o. M on heparin for recurrent Afib RVR. Noted pt is s/p CABG post op day 6.  Warfarin started post and INR was reported as 5.28 this am and heparin was stopped.  Repeat INR is 1.51. Patient noted for cardioversion on 2/15. -Hg= 10.9, plt= 259 Heparin drip increased to 1250 units/hr and  HL= 0.26 Heparin drip increased to 1400 units/hr and  HL= 0.49  Goal of Therapy:  INR 2-3 Heparin level 0.3-0.5 units/ml Monitor platelets by anticoagulation protocol: Yes   Plan:   -Decrease Heparin to 1350 units/hr -Heparin level in 8 hours and daily wth CBC daily  Thank you for allowing [place] to participate in this patients care. Jens Som, PharmD Pager: [phone] 03/21/2015 8:34 PM

## 2015-03-21 NOTE — Progress Notes (Signed)
Patient ID: Jonathan Orozco, male   DOB: [birth year], 82 y.o.   MRN: [id number]    Patient Name: Jonathan Orozco Date of Encounter: 03/21/2015     Active Problems:   Hyperlipidemia   VENTRICULAR TACHYCARDIA   Atrial flutter/Fibrillation   Long term (current) use of anticoagulants   Heme positive stool   Exertional angina (HCC)   Crescendo angina (HCC)   Paroxysmal atrial fibrillation (HCC)   CAD (coronary artery disease), native coronary artery   CAD (coronary artery disease)    SUBJECTIVE  Continued withAFib and RVR   Received dig yesterday  FEELS anxious and week Plan was to cardiovert today if still in AFib  CURRENT MEDS . amiodarone  400 mg Oral TID  . aspirin EC  81 mg Oral Daily   Or  . aspirin  81 mg Per Tube Daily  . atorvastatin  10 mg Oral Daily  . bisacodyl  10 mg Oral Daily   Or  . bisacodyl  10 mg Rectal Daily  . docusate sodium  200 mg Oral Daily  . hydrocortisone cream   Topical BID  . metoprolol tartrate  50 mg Oral TID  . moving right along book   Does not apply Once  . multivitamin with minerals  1 tablet Oral Daily  . omega-3 acid ethyl esters  1 g Oral Daily  . sodium chloride flush  3 mL Intravenous Q12H  . warfarin  7.5 mg Oral q1800  . Warfarin - Physician Dosing Inpatient   Does not apply q1800    OBJECTIVE  Filed Vitals:   03/20/15 1220 03/20/15 1500 03/20/15 2003 03/21/15 0459  BP: 115/76 106/77 137/82 102/66  Pulse:  138 133 129  Temp:  98.5 F (36.9 C) 98.3 F (36.8 C) 98.7 F (37.1 C)  TempSrc:  Oral Oral Oral  Resp:  20 20 18   Height:      Weight:    179 lb 1.6 oz (81.239 kg)  SpO2:  94% 93% 93%    Intake/Output Summary (Last 24 hours) at 03/21/15 0936 Last data filed at 03/21/15 0730  Gross per 24 hour  Intake    240 ml  Output   2450 ml  Net  -2210 ml   Filed Weights   03/19/15 1220 03/20/15 0448 03/21/15 0459  Weight: 180 lb 8 oz (81.874 kg) 179 lb 12.8 oz (81.557 kg) 179 lb 1.6 oz (81.239 kg)    PHYSICAL  EXAM  General: Pleasant, NAD. Neuro: Alert and oriented X 3. Moves all extremities spontaneously. Psych: Normal affect. HEENT:  Normal  Neck: Supple without bruits or JVD. Lungs:  Resp regular and unlabored, CTA. Heart: RAPID AND IRREGULAR RATE AND R no s3, s4, or murmurs. Abdomen: Soft, non-tender, non-distended, BS + x 4.  Extremities: No clubbing, cyanosis or edema. DP/PT/Radials 2+ and equal bilaterally.  Accessory Clinical Findings  CBC  Recent Labs  03/20/15 0537 03/21/15 0533  WBC 8.2 7.9  HGB 11.2* 10.9*  HCT 34.7* 32.3*  MCV 94.0 95.0  PLT 247 0000000   Basic Metabolic Panel  Recent Labs  03/19/15 0404 03/20/15 0537  NA 136 131*  K 4.0 3.8  CL 104 97*  CO2 27 25  GLUCOSE 100* 101*  BUN 7 10  CREATININE 0.73 0.74  CALCIUM 8.2* 7.9*   Liver Function Tests No results for input(s): AST, ALT, ALKPHOS, BILITOT, PROT, ALBUMIN in the last 72 hours. No results for input(s): LIPASE, AMYLASE in the last 72 hours. Cardiac Enzymes  No results for input(s): CKTOTAL, CKMB, CKMBINDEX, TROPONINI in the last 72 hours. BNP Invalid input(s): POCBNP D-Dimer No results for input(s): DDIMER in the last 72 hours. Hemoglobin A1C No results for input(s): HGBA1C in the last 72 hours. Fasting Lipid Panel No results for input(s): CHOL, HDL, LDLCALC, TRIG, CHOLHDL, LDLDIRECT in the last 72 hours. Thyroid Function Tests No results for input(s): TSH, T4TOTAL, T3FREE, THYROIDAB in the last 72 hours.  Invalid input(s): FREET3  TELE  Atrial fib with a RVR  Radiology/Studies  Dg Chest Port 1 View  03/18/2015  CLINICAL DATA:  82 year old male status post CABG.  Atelectasis. EXAM: PORTABLE CHEST 1 VIEW COMPARISON:  03/17/2015 and prior exams FINDINGS: A right IJ central venous catheter sheath, CABG changes and left atrial clip again noted. Left lower lung consolidation/ atelectasis and mild right basilar atelectasis are unchanged. There is no evidence of pneumothorax. Little  significant change is noted since the prior study. IMPRESSION: Little significant change with continued left lower lung consolidation/ atelectasis and mild right basilar atelectasis. No evidence of pneumothorax. Electronically Signed   By: Margarette Canada M.D.   On: 03/18/2015 08:03   Dg Chest Port 1 View  03/17/2015  CLINICAL DATA:  Atelectasis EXAM: PORTABLE CHEST 1 VIEW COMPARISON:  Yesterday FINDINGS: Stable cardiopericardial enlargement. Stable aortic contours. The patient is status post CABG and left atrial exclusion. Right IJ catheter in stable position. Layering left pleural effusion with presumed overlying atelectasis. Interstitial coarsening is increased from prior. No pneumothorax. IMPRESSION: 1. Increased venous congestion. 2. Unchanged left basilar atelectasis and effusion. Electronically Signed   By: Monte Fantasia M.D.   On: 03/17/2015 07:38   Dg Chest Port 1 View  03/16/2015  CLINICAL DATA:  Status post CABG 2 days ago; shortness of breath today. EXAM: PORTABLE CHEST 1 VIEW COMPARISON:  Portable chest x-ray of March 15, 2015 FINDINGS: The lungs are adequately inflated. There is no pneumothorax or pneumomediastinum. There is persistent left lower lobe atelectasis. There is persistent obscuration of the hemidiaphragms. The pulmonary interstitial markings are slightly more conspicuous today. The cardiac silhouette remains enlarged. The sternal wires are intact. The left atrial appendage clip is unchanged. The chest tubes have been removed as has the Swan-Ganz catheter. The right internal jugular Cordis sheath is unchanged with the tip projecting over the proximal SVC. IMPRESSION: Slight interval increased prominence of the pulmonary interstitial markings especially on the left. Persistent left lower lobe atelectasis and small left pleural effusion. Minimal subsegmental right and perihilar and infrahilar atelectasis. No pneumothorax. Electronically Signed   By: David  Martinique M.D.   On: 03/16/2015  07:43   Dg Chest Port 1 View  03/15/2015  CLINICAL DATA:  Status post CABG EXAM: PORTABLE CHEST 1 VIEW COMPARISON:  Portable chest x-ray of March 14, 2015 FINDINGS: There has been interval extubation of the trachea and of the esophagus. The lungs are reasonably well expanded. There is a small left pleural effusion. There is no pneumothorax. The heart is top-normal in size. The left atrial appendage clip is unchanged in appearance. The sternal wires are intact. The pulmonary vascularity is not engorged. There is retrocardiac subsegmental atelectasis on the left. Patchy subsegmental atelectasis on the right in the infrahilar region persists. The Swan-Ganz catheter tip appears wedged in a lower lobe pulmonary artery on the right. The bilateral chest tubes are in stable position. IMPRESSION: Persistent left lower lobe atelectasis and trace left pleural effusion. Right infrahilar atelectasis as well. Status post extubation of the trachea. The Swan-Ganz  catheter appears wedged in a right lower lobe pulmonary artery. Electronically Signed   By: David  Martinique M.D.   On: 03/15/2015 07:40   Dg Chest Port 1 View  03/14/2015  CLINICAL DATA:  Pneumothorax. EXAM: PORTABLE CHEST 1 VIEW COMPARISON:  March 04, 2015. FINDINGS: Stable cardiomediastinal silhouette. Status post coronary artery bypass graft. Endotracheal tube is in grossly good position, with distal tip 5 cm above the carina. Nasogastric tube tip is seen in proximal stomach. Right internal jugular Swan-Ganz catheter is noted with distal tip in expected position of main pulmonary artery. Bilateral chest tubes appear to be present, without evidence of pneumothorax. Mild bibasilar atelectasis is noted. IMPRESSION: Status post coronary artery bypass graft. Endotracheal and nasogastric tubes are in grossly good position. No pneumothorax is noted. Bibasilar atelectasis is noted. Electronically Signed   By: Marijo Conception, M.D.   On: 03/14/2015 14:44   Dg Chest Port  1 View  03/04/2015  CLINICAL DATA:  Heart palpitations. EXAM: PORTABLE CHEST 1 VIEW COMPARISON:  11/10/2014. FINDINGS: 1430 hours. Low volume film without focal airspace consolidation, pulmonary edema, or pleural effusion. Cardiopericardial silhouette is at upper limits of normal for size. The visualized bony structures of the thorax are intact. Telemetry leads overlie the chest. IMPRESSION: No acute cardiopulmonary findings. Electronically Signed   By: Misty Stanley M.D.   On: 03/04/2015 14:40    ASSESSMENT AND PLAN  1. Atrial fib with a RVR 2. S/p CABG 3. HTN Rec: he will continue amio 400 tid, continue IV lopressor.   Will plan to cardiovert later this week as he had transient but not sustained sinus yesterday making the likely value of DCCV today very low Will continue amio loading  His HR is fast and we can continue with IV lopressor for now I have encouraged him to be out of bed as much as possible so as to avoid  BED REST DISEASE

## 2015-03-21 NOTE — Progress Notes (Signed)
ANTICOAGULATION CONSULT NOTE - Follow-up Consult  Pharmacy Consult for Heparin Indication: atrial fibrillation  Allergies  Allergen Reactions  . Ace Inhibitors     REACTION: cough  . Amiodarone Hcl     REACTION: intolerance Patient reports photosensitivity    Patient Measurements: Height: 6' (182.9 cm) Weight: 179 lb 1.6 oz (81.239 kg) IBW/kg (Calculated) : 77.6   Vital Signs: Temp: 98.7 F (37.1 C) (02/15 0459) Temp Source: Oral (02/15 0459) BP: 102/66 mmHg (02/15 0459) Pulse Rate: 129 (02/15 0459)  Labs:  Recent Labs  03/19/15 0404 03/19/15 2355 03/20/15 0537 03/20/15 1039 03/20/15 2157 03/21/15 0533  HGB  --   --  11.2*  --   --  10.9*  HCT  --   --  34.7*  --   --  32.3*  PLT  --   --  247  --   --  254  LABPROT 15.7*  --  46.8* 18.3*  --  20.5*  INR 1.23  --  5.28* 1.51*  --  1.76*  HEPARINUNFRC  --  0.29*  --   --  0.25* 0.26*  CREATININE 0.73  --  0.74  --   --   --     Estimated Creatinine Clearance: 74.1 mL/min (by C-G formula based on Cr of 0.74).   Assessment: 82 y.o. M on heparin for recurrent Afib RVR. Noted pt is s/p CABG post op day 6.  Warfarin started post and INR was reported as 5.28 this am and heparin was stopped.  Repeat INR is 1.51. Patient noted for cardioversion on 2/15. -Hg= 10.9, plt= 259 Heparin drip increased to 1250 units/hr and  HL= 0.26  Goal of Therapy:  INR 2-3 Heparin level 0.3-0.5 units/ml Monitor platelets by anticoagulation protocol: Yes   Plan:   -Increase  Heparin to 1400 units/hr -Heparin level in 8 hours and daily wth CBC daily  Hildred Laser, Pharm D 03/21/2015 9:35 AM

## 2015-03-21 NOTE — Progress Notes (Addendum)
      [address]       [place] [address]             [phone]      7 Days Post-Op Procedure(s) (LRB): CORONARY ARTERY BYPASS GRAFTING (CABG) x 4 (LIMA to LAD, SVG to DIAGONAL 2, SVG SEQUENTIALLY to OM1 and OM2) (N/A) CLIPPING OF ATRIAL APPENDAGE (N/A) TRANSESOPHAGEAL ECHOCARDIOGRAM (TEE) (N/A)   Subjective:  [name] states he wants an anxiety pill.  He states he takes his at home.  He continues to have palpitations.  Objective: Vital signs in last 24 hours: Temp:  [98.3 F (36.8 C)-98.7 F (37.1 C)] 98.7 F (37.1 C) (02/15 0459) Pulse Rate:  [129-138] 129 (02/15 0459) Cardiac Rhythm:  [-] Atrial fibrillation;Bundle branch block (02/15 0700) Resp:  [18-20] 18 (02/15 0459) BP: (102-137)/(66-82) 102/66 mmHg (02/15 0459) SpO2:  [93 %-97 %] 93 % (02/15 0459) Weight:  [179 lb 1.6 oz (81.239 kg)] 179 lb 1.6 oz (81.239 kg) (02/15 0459)  Intake/Output from previous day: 02/14 0701 - 02/15 0700 In: 480 [P.O.:480] Out: 1600 [Urine:1600] Intake/Output this shift: Total I/O In: -  Out: 300 [Urine:300]  General appearance: alert, cooperative and no distress Heart: irregularly irregular rhythm Lungs: clear to auscultation bilaterally Abdomen: soft, non-tender; bowel sounds normal; no masses,  no organomegaly Extremities: edema trace Wound: clean and dry  Lab Results:  Recent Labs  03/20/15 0537 03/21/15 0533  WBC 8.2 7.9  HGB 11.2* 10.9*  HCT 34.7* 32.3*  PLT 247 254   BMET:  Recent Labs  03/19/15 0404 03/20/15 0537  NA 136 131*  K 4.0 3.8  CL 104 97*  CO2 27 25  GLUCOSE 100* 101*  BUN 7 10  CREATININE 0.73 0.74  CALCIUM 8.2* 7.9*    PT/INR:  Recent Labs  03/21/15 0533  LABPROT 20.5*  INR 1.76*   ABG    Component Value Date/Time   PHART 7.362 03/14/2015 1857   HCO3 22.8 03/14/2015 1857   TCO2 23 03/15/2015 1604   ACIDBASEDEF 2.0 03/14/2015 1857   O2SAT 96.0 03/14/2015 1857   CBG (last 3)  No results for input(s):  GLUCAP in the last 72 hours.  Assessment/Plan: S/P Procedure(s) (LRB): CORONARY ARTERY BYPASS GRAFTING (CABG) x 4 (LIMA to LAD, SVG to DIAGONAL 2, SVG SEQUENTIALLY to OM1 and OM2) (N/A) CLIPPING OF ATRIAL APPENDAGE (N/A) TRANSESOPHAGEAL ECHOCARDIOGRAM (TEE) (N/A)  1.  CV- A. Fib, rate remains in the 130s- on Amiodarone, Lopressor, for cardioversion today 2. Pulm- off oxygen, continue IS 3. Anxiety-will restart 0.5mg  daily prn as home dose 4. INR 1.76, continue coumadin 5. Dispo- continue A. Fib, cardioversion today, continue coumadin   LOS: 9 days    Jonathan Orozco 03/21/2015  Patient seen and examined, agree with above He is in [place] currently so DCCV cancelled Continue medical therapy as per cardiology Not yet therapeutic with coumadin  Remo Lipps C. Roxan Hockey, MD [facility] [phone]

## 2015-03-22 LAB — CBC
HEMATOCRIT: 31.4 % — AB (ref 39.0–52.0)
HEMOGLOBIN: 10.4 g/dL — AB (ref 13.0–17.0)
MCH: 31 pg (ref 26.0–34.0)
MCHC: 33.1 g/dL (ref 30.0–36.0)
MCV: 93.7 fL (ref 78.0–100.0)
Platelets: 276 10*3/uL (ref 150–400)
RBC: 3.35 MIL/uL — ABNORMAL LOW (ref 4.22–5.81)
RDW: 14.1 % (ref 11.5–15.5)
WBC: 9.9 10*3/uL (ref 4.0–10.5)

## 2015-03-22 LAB — PROTIME-INR
INR: 2.65 — AB (ref 0.00–1.49)
Prothrombin Time: 27.9 seconds — ABNORMAL HIGH (ref 11.6–15.2)

## 2015-03-22 LAB — HEPARIN LEVEL (UNFRACTIONATED): HEPARIN UNFRACTIONATED: 0.6 [IU]/mL (ref 0.30–0.70)

## 2015-03-22 MED ORDER — FUROSEMIDE 40 MG PO TABS
40.0000 mg | ORAL_TABLET | Freq: Every day | ORAL | Status: DC
  Administered 2015-03-22 – 2015-03-23 (×2): 40 mg via ORAL
  Filled 2015-03-22 (×2): qty 1

## 2015-03-22 MED ORDER — WARFARIN SODIUM 5 MG PO TABS
5.0000 mg | ORAL_TABLET | Freq: Every day | ORAL | Status: DC
  Administered 2015-03-22: 5 mg via ORAL
  Filled 2015-03-22: qty 1

## 2015-03-22 MED ORDER — POTASSIUM CHLORIDE CRYS ER 20 MEQ PO TBCR
20.0000 meq | EXTENDED_RELEASE_TABLET | Freq: Every day | ORAL | Status: DC
  Administered 2015-03-22 – 2015-03-23 (×2): 20 meq via ORAL
  Filled 2015-03-22 (×2): qty 1

## 2015-03-22 NOTE — Progress Notes (Signed)
ANTICOAGULATION CONSULT NOTE - Follow Up Consult  Pharmacy Consult for Heparin  Indication: atrial fibrillation  Allergies  Allergen Reactions  . Ace Inhibitors     REACTION: cough  . Amiodarone Hcl     REACTION: intolerance Patient reports photosensitivity    Patient Measurements: Height: 6' (182.9 cm) Weight: 181 lb (82.101 kg) IBW/kg (Calculated) : 77.6  Vital Signs: Temp: 98.1 F (36.7 C) (02/16 0335) Temp Source: Oral (02/16 0335) BP: 113/59 mmHg (02/16 0335) Pulse Rate: 72 (02/16 0335)  Labs:  Recent Labs  03/20/15 0537 03/20/15 1039  03/21/15 0533 03/21/15 1825 03/22/15 0350  HGB 11.2*  --   --  10.9*  --  10.4*  HCT 34.7*  --   --  32.3*  --  31.4*  PLT 247  --   --  254  --  276  LABPROT 46.8* 18.3*  --  20.5*  --  27.9*  INR 5.28* 1.51*  --  1.76*  --  2.65*  HEPARINUNFRC  --   --   < > 0.26* 0.49 0.60  CREATININE 0.74  --   --   --   --   --   < > = values in this interval not displayed.  Estimated Creatinine Clearance: 74.1 mL/min (by C-G formula based on Cr of 0.74).   Assessment: Heparin level is now therapeutic x 2, noted large jump in INR from 1.76>>2.65  Goal of Therapy:  Heparin level 0.3-0.7 units/ml Monitor platelets by anticoagulation protocol: Yes   Plan:  -Continue heparin 1350 units/hr -Daily CBC/HL -Can likely DC heparin today/tomorrow   Narda Bonds 03/22/2015,4:19 AM

## 2015-03-22 NOTE — Progress Notes (Signed)
Utilization review completed.  

## 2015-03-22 NOTE — Progress Notes (Addendum)
CARDIAC REHAB PHASE I   PRE:  Rate/Rhythm: 76 SR  BP:  Sitting: 119/64        SaO2: 95 RA  MODE:  Ambulation: 160 ft   POST:  Rate/Rhythm: 93 SR c/ ?PACs  BP:  Sitting: 122/91         SaO2: 96 RA  Pt sitting up in chair, very anxious, but asking to walk. Pt very concerned about his heart rate/rhythm, asking for constant reassurance, pt frequently checking heart monitor. Pt needed reminder cues for sternal precautions, some assistance to stand. Pt ambulated 160 ft on RA, rolling walker, gait belt,  assist x1, mildly unsteady gait, tolerated well. Pt denies any complaints, however he remains anxious. Pt assisted to bathroom upon return to room where he had a bowel movement. Pt to recliner after walk, call bell within reach. Encouraged ambulation, IS. Pt will benefit from PT consult for possible home health needs. Will follow-up tomorrow.   [id number] Lenna Sciara, RN, BSN 03/22/2015 11:02 AM

## 2015-03-22 NOTE — Progress Notes (Signed)
Removed epicardial wires per order. 4 intact.  Pt tolerated procedure well.  Pt instructed to remain on bedrest for one hour.  Frequent vitals will be taken and documented. Pt resting with call bell within reach. Payton Emerald, RN

## 2015-03-22 NOTE — Progress Notes (Signed)
Patient ID: KIE BIERLY, male   DOB: 09/23/1928, 82 y.o.   MRN: [id number]    Patient Name: Jonathan Orozco Date of Encounter: 03/22/2015     Active Problems:   Hyperlipidemia   VENTRICULAR TACHYCARDIA   Atrial flutter/Fibrillation   Long term (current) use of anticoagulants   Heme positive stool   Exertional angina (HCC)   Crescendo angina (HCC)   Paroxysmal atrial fibrillation (HCC)   CAD (coronary artery disease), native coronary artery   CAD (coronary artery disease)    SUBJECTIVE  Now in  sinus rhythm; is improved.    b  CURRENT MEDS . amiodarone  400 mg Oral TID  . aspirin EC  81 mg Oral Daily   Or  . aspirin  81 mg Per Tube Daily  . atorvastatin  10 mg Oral Daily  . bisacodyl  10 mg Oral Daily   Or  . bisacodyl  10 mg Rectal Daily  . docusate sodium  200 mg Oral Daily  . hydrocortisone cream   Topical BID  . metoprolol tartrate  50 mg Oral TID  . moving right along book   Does not apply Once  . multivitamin with minerals  1 tablet Oral Daily  . omega-3 acid ethyl esters  1 g Oral Daily  . sodium chloride flush  3 mL Intravenous Q12H  . warfarin  7.5 mg Oral q1800  . Warfarin - Physician Dosing Inpatient   Does not apply q1800    OBJECTIVE  Filed Vitals:   03/21/15 1356 03/21/15 1958 03/22/15 0335 03/22/15 0341  BP: 107/62 91/52 113/59   Pulse: 75 69 72   Temp: 97.9 F (36.6 C) 98.5 F (36.9 C) 98.1 F (36.7 C)   TempSrc: Oral Oral Oral   Resp: 18 18 18    Height:      Weight:    181 lb (82.101 kg)  SpO2: 98% 96% 95%     Intake/Output Summary (Last 24 hours) at 03/22/15 0703 Last data filed at 03/22/15 0338  Gross per 24 hour  Intake      0 ml  Output   1498 ml  Net  -1498 ml   Filed Weights   03/20/15 0448 03/21/15 0459 03/22/15 0341  Weight: 179 lb 12.8 oz (81.557 kg) 179 lb 1.6 oz (81.239 kg) 181 lb (82.101 kg)    PHYSICAL EXAM  General: Pleasant, NAD. Neuro: Alert and oriented X 3. Moves all extremities spontaneously. Psych: Normal  affect. HEENT:  Normal  Neck: Supple without bruits or JVD. Lungs:  Resp regular and unlabored, CTA. Heart:  REGULAR RATE AND R no s3, s4, or murmurs. Abdomen: Soft, non-tender, non-distended, BS + x 4.  Extremities: No clubbing, cyanosis or edema. DP/PT/Radials 2+ and equal bilaterally.     CBC  Recent Labs  03/21/15 0533 03/22/15 0350  WBC 7.9 9.9  HGB 10.9* 10.4*  HCT 32.3* 31.4*  MCV 95.0 93.7  PLT 254 [id number]   Basic Metabolic Panel  Recent Labs  03/20/15 0537  NA 131*  K 3.8  CL 97*  CO2 25  GLUCOSE 101*  BUN 10  CREATININE 0.74  CALCIUM 7.9*   Liver Function Tests No results for input(s): AST, ALT, ALKPHOS, BILITOT, PROT, ALBUMIN in the last 72 hours. No results for input(s): LIPASE, AMYLASE in the last 72 hours. Cardiac Enzymes No results for input(s): CKTOTAL, CKMB, CKMBINDEX, TROPONINI in the last 72 hours. BNP Invalid input(s): POCBNP D-Dimer No results for input(s): DDIMER in the last  72 hours. Hemoglobin A1C No results for input(s): HGBA1C in the last 72 hours. Fasting Lipid Panel No results for input(s): CHOL, HDL, LDLCALC, TRIG, CHOLHDL, LDLDIRECT in the last 72 hours. Thyroid Function Tests No results for input(s): TSH, T4TOTAL, T3FREE, THYROIDAB in the last 72 hours.  Invalid input(s): FREET3  TELE sinus rhythm with occasional PACs  Radiology/Studies  Dg Chest Port 1 View  03/18/2015  CLINICAL DATA:  82 year old male status post CABG.  Atelectasis. EXAM: PORTABLE CHEST 1 VIEW COMPARISON:  03/17/2015 and prior exams FINDINGS: A right IJ central venous catheter sheath, CABG changes and left atrial clip again noted. Left lower lung consolidation/ atelectasis and mild right basilar atelectasis are unchanged. There is no evidence of pneumothorax. Little significant change is noted since the prior study. IMPRESSION: Little significant change with continued left lower lung consolidation/ atelectasis and mild right basilar atelectasis. No evidence of  pneumothorax. Electronically Signed   By: Margarette Canada M.D.   On: 03/18/2015 08:03   Dg Chest Port 1 View  03/17/2015  CLINICAL DATA:  Atelectasis EXAM: PORTABLE CHEST 1 VIEW COMPARISON:  Yesterday FINDINGS: Stable cardiopericardial enlargement. Stable aortic contours. The patient is status post CABG and left atrial exclusion. Right IJ catheter in stable position. Layering left pleural effusion with presumed overlying atelectasis. Interstitial coarsening is increased from prior. No pneumothorax. IMPRESSION: 1. Increased venous congestion. 2. Unchanged left basilar atelectasis and effusion. Electronically Signed   By: Monte Fantasia M.D.   On: 03/17/2015 07:38   Dg Chest Port 1 View  03/16/2015  CLINICAL DATA:  Status post CABG 2 days ago; shortness of breath today. EXAM: PORTABLE CHEST 1 VIEW COMPARISON:  Portable chest x-ray of March 15, 2015 FINDINGS: The lungs are adequately inflated. There is no pneumothorax or pneumomediastinum. There is persistent left lower lobe atelectasis. There is persistent obscuration of the hemidiaphragms. The pulmonary interstitial markings are slightly more conspicuous today. The cardiac silhouette remains enlarged. The sternal wires are intact. The left atrial appendage clip is unchanged. The chest tubes have been removed as has the Swan-Ganz catheter. The right internal jugular Cordis sheath is unchanged with the tip projecting over the proximal SVC. IMPRESSION: Slight interval increased prominence of the pulmonary interstitial markings especially on the left. Persistent left lower lobe atelectasis and small left pleural effusion. Minimal subsegmental right and perihilar and infrahilar atelectasis. No pneumothorax. Electronically Signed   By: David  Martinique M.D.   On: 03/16/2015 07:43   Dg Chest Port 1 View  03/15/2015  CLINICAL DATA:  Status post CABG EXAM: PORTABLE CHEST 1 VIEW COMPARISON:  Portable chest x-ray of March 14, 2015 FINDINGS: There has been interval  extubation of the trachea and of the esophagus. The lungs are reasonably well expanded. There is a small left pleural effusion. There is no pneumothorax. The heart is top-normal in size. The left atrial appendage clip is unchanged in appearance. The sternal wires are intact. The pulmonary vascularity is not engorged. There is retrocardiac subsegmental atelectasis on the left. Patchy subsegmental atelectasis on the right in the infrahilar region persists. The Swan-Ganz catheter tip appears wedged in a lower lobe pulmonary artery on the right. The bilateral chest tubes are in stable position. IMPRESSION: Persistent left lower lobe atelectasis and trace left pleural effusion. Right infrahilar atelectasis as well. Status post extubation of the trachea. The Swan-Ganz catheter appears wedged in a right lower lobe pulmonary artery. Electronically Signed   By: David  Martinique M.D.   On: 03/15/2015 07:40   Dg  Chest Port 1 View  03/14/2015  CLINICAL DATA:  Pneumothorax. EXAM: PORTABLE CHEST 1 VIEW COMPARISON:  March 04, 2015. FINDINGS: Stable cardiomediastinal silhouette. Status post coronary artery bypass graft. Endotracheal tube is in grossly good position, with distal tip 5 cm above the carina. Nasogastric tube tip is seen in proximal stomach. Right internal jugular Swan-Ganz catheter is noted with distal tip in expected position of main pulmonary artery. Bilateral chest tubes appear to be present, without evidence of pneumothorax. Mild bibasilar atelectasis is noted. IMPRESSION: Status post coronary artery bypass graft. Endotracheal and nasogastric tubes are in grossly good position. No pneumothorax is noted. Bibasilar atelectasis is noted. Electronically Signed   By: Marijo Conception, M.D.   On: 03/14/2015 14:44   Dg Chest Port 1 View  03/04/2015  CLINICAL DATA:  Heart palpitations. EXAM: PORTABLE CHEST 1 VIEW COMPARISON:  11/10/2014. FINDINGS: 1430 hours. Low volume film without focal airspace consolidation,  pulmonary edema, or pleural effusion. Cardiopericardial silhouette is at upper limits of normal for size. The visualized bony structures of the thorax are intact. Telemetry leads overlie the chest. IMPRESSION: No acute cardiopulmonary findings. Electronically Signed   By: Misty Stanley M.D.   On: 03/04/2015 14:40    ASSESSMENT AND PLAN  1. Atrial fib with a RVR 2. S/p CABG 3. HTN Rec: he will continue amio 400 tid,   He is reverted to sinus rhythm. The next issue will be disposition. We need helped to understand how much support he will need at least to go home or whether he needs inpatient rehabilitation.  Will continue amio loading

## 2015-03-22 NOTE — Progress Notes (Addendum)
      [address]       [place] [address]             [phone]      8 Days Post-Op Procedure(s) (LRB): CORONARY ARTERY BYPASS GRAFTING (CABG) x 4 (LIMA to LAD, SVG to DIAGONAL 2, SVG SEQUENTIALLY to OM1 and OM2) (N/A) CLIPPING OF ATRIAL APPENDAGE (N/A) TRANSESOPHAGEAL ECHOCARDIOGRAM (TEE) (N/A)   Subjective:  Mr. Shenoy has no complaints this morning.  He converted to NSR yesterday.  + ambulation  + BM  Objective: Vital signs in last 24 hours: Temp:  [97.9 F (36.6 C)-98.5 F (36.9 C)] 98.1 F (36.7 C) (02/16 0335) Pulse Rate:  [69-75] 72 (02/16 0335) Cardiac Rhythm:  [-] Heart block;Bundle branch block (02/15 1900) Resp:  [18] 18 (02/16 0335) BP: (91-113)/(52-62) 113/59 mmHg (02/16 0335) SpO2:  [95 %-98 %] 95 % (02/16 0335) Weight:  [181 lb (82.101 kg)] 181 lb (82.101 kg) (02/16 0341)  Intake/Output from previous day: 02/15 0701 - 02/16 0700 In: 0  Out: 1498 [Urine:1498]  General appearance: alert, cooperative and no distress Heart: regular rate and rhythm Lungs: clear to auscultation bilaterally Abdomen: soft, non-tender; bowel sounds normal; no masses,  no organomegaly Extremities: edema trace Wound: clean and dry  Lab Results:  Recent Labs  03/21/15 0533 03/22/15 0350  WBC 7.9 9.9  HGB 10.9* 10.4*  HCT 32.3* 31.4*  PLT 254 276   BMET:  Recent Labs  03/20/15 0537  NA 131*  K 3.8  CL 97*  CO2 25  GLUCOSE 101*  BUN 10  CREATININE 0.74  CALCIUM 7.9*    PT/INR:  Recent Labs  03/22/15 0350  LABPROT 27.9*  INR 2.65*   ABG    Component Value Date/Time   PHART 7.362 03/14/2015 1857   HCO3 22.8 03/14/2015 1857   TCO2 23 03/15/2015 1604   ACIDBASEDEF 2.0 03/14/2015 1857   O2SAT 96.0 03/14/2015 1857   CBG (last 3)  No results for input(s): GLUCAP in the last 72 hours.  Assessment/Plan: S/P Procedure(s) (LRB): CORONARY ARTERY BYPASS GRAFTING (CABG) x 4 (LIMA to LAD, SVG to DIAGONAL 2, SVG SEQUENTIALLY to OM1 and OM2)  (N/A) CLIPPING OF ATRIAL APPENDAGE (N/A) TRANSESOPHAGEAL ECHOCARDIOGRAM (TEE) (N/A)  1. CV- converted to NSR, rate in the 70s- continue Amiodarone, Lopressor- may need to decrease dose of Lopressor if HR wont tolerate 2. INR 2.65- will d/c Heparin, continue coumadin, but will decrease dose to 5 mg daily which was home dose 3. Pulm- no acute issues, continue IS 4. Renal- weight has slowly trended up, some mild pitting LE edema, will give Lasix 40 mg daily 5. Dispo- patient stable, maintaining NSR currently, INR is therapeutic, will d/c EPW today if okay with staff, possibly home tomorrow if continues to maintain NSR   LOS: 10 days    Ahmed Prima, ERIN 03/22/2015  Patient seen and examined, agree with above EPW out- no issues Possible dc tomorrow  Remo Lipps C. Roxan Hockey, MD [facility] [phone]

## 2015-03-23 ENCOUNTER — Telehealth: Payer: Self-pay | Admitting: *Deleted

## 2015-03-23 LAB — PROTIME-INR
INR: 3.6 — ABNORMAL HIGH (ref 0.00–1.49)
PROTHROMBIN TIME: 35.1 s — AB (ref 11.6–15.2)

## 2015-03-23 LAB — CBC
HCT: 30.7 % — ABNORMAL LOW (ref 39.0–52.0)
Hemoglobin: 10.4 g/dL — ABNORMAL LOW (ref 13.0–17.0)
MCH: 31.8 pg (ref 26.0–34.0)
MCHC: 33.9 g/dL (ref 30.0–36.0)
MCV: 93.9 fL (ref 78.0–100.0)
PLATELETS: 294 10*3/uL (ref 150–400)
RBC: 3.27 MIL/uL — ABNORMAL LOW (ref 4.22–5.81)
RDW: 14.4 % (ref 11.5–15.5)
WBC: 7.7 10*3/uL (ref 4.0–10.5)

## 2015-03-23 LAB — HEPARIN LEVEL (UNFRACTIONATED): Heparin Unfractionated: <0.1 IU/mL — ABNORMAL LOW (ref 0.30–0.70)

## 2015-03-23 MED ORDER — POTASSIUM CHLORIDE CRYS ER 20 MEQ PO TBCR: 20.0000 meq | EXTENDED_RELEASE_TABLET | Freq: Every day | ORAL | Status: DC

## 2015-03-23 MED ORDER — WARFARIN SODIUM 5 MG PO TABS: ORAL_TABLET | ORAL | Status: DC

## 2015-03-23 MED ORDER — AMIODARONE HCL 400 MG PO TABS: 400.0000 mg | ORAL_TABLET | Freq: Two times a day (BID) | ORAL | Status: DC

## 2015-03-23 MED ORDER — WARFARIN SODIUM 2.5 MG PO TABS: 2.5000 mg | ORAL_TABLET | Freq: Every day | ORAL | Status: DC

## 2015-03-23 MED ORDER — AMIODARONE HCL 400 MG PO TABS: 400.0000 mg | ORAL_TABLET | Freq: Three times a day (TID) | ORAL | Status: DC

## 2015-03-23 MED ORDER — ASPIRIN 81 MG PO TBEC: 81.0000 mg | DELAYED_RELEASE_TABLET | Freq: Every day | ORAL | Status: DC

## 2015-03-23 MED ORDER — HYDROCODONE-ACETAMINOPHEN 5-325 MG PO TABS: 1.0000 | ORAL_TABLET | ORAL | Status: DC | PRN

## 2015-03-23 MED ORDER — FUROSEMIDE 40 MG PO TABS: 40.0000 mg | ORAL_TABLET | Freq: Every day | ORAL | Status: DC

## 2015-03-23 MED ORDER — METOPROLOL TARTRATE 50 MG PO TABS: 50.0000 mg | ORAL_TABLET | Freq: Two times a day (BID) | ORAL | Status: DC

## 2015-03-23 NOTE — Progress Notes (Signed)
Patient discharged home with wife and Home Health. Medications and discharge instructions were educated on and the patient and wife stated that they understood. IV was dc'd amd was intact.

## 2015-03-23 NOTE — Care Management Important Message (Signed)
Important Message  Patient Details  Name: Jonathan Orozco MRN: [id number] Date of Birth: Jun 28, 1928   Medicare Important Message Given:  Yes    Rever Pichette Abena 03/23/2015, 2:03 PM

## 2015-03-23 NOTE — Care Management Note (Addendum)
Case Management Note Previous CM note initiated by [facility] RN,CM  Patient Details  Name: Jonathan Orozco MRN: [id number] Date of Birth: [birth year]  Subjective/Objective:   Pt lives with spouse who will be available for assistance 24/7 when he is medically stable for discharge.  Pt OOB to chair and using IS as directed.                       Action/Plan: 2/17-Alise Calais RN, BSN- Pt s/p CABG x4 with post op afib- ready for d/c  On 2/17- HH orders have been written for RN/PT- spoke with pt and wife at bedside- choice offered for [facility] agencies- per choice they would like to use [facility] for services- referral called to Lelan Pons with [facility] for HH-RN/PT, pt would like RW and transfer chair but prefers to go to [facility] store for this- prescription has been given to wife for these items by the PA. No further CM needs noted.   Expected Discharge Date:    03/23/15              Expected Discharge Plan:  [place]  In-House Referral:     Discharge planning Services  CM Consult  Post Acute Care Choice:  Durable Medical Equipment, Home Health Choice offered to:  Patient, Spouse  DME Arranged:  [place] [facility] DME Agency:  Other - Comment  HH Arranged:  RN, PT [place] Agency:  [place]  Status of Service:  Completed, signed off  Medicare Important Message Given:  Yes Date Medicare IM Given:    Medicare IM give by:    Date Additional Medicare IM Given:    Additional Medicare Important Message give by:     If discussed at [place] of Stay Meetings, dates discussed:  03/22/15  Discharge Disposition: Home/home health   Additional Comments:  Dawayne Patricia, RN 03/23/2015, 10:28 AM

## 2015-03-23 NOTE — Care Management Important Message (Signed)
Important Message  Patient Details  Name: Jonathan Orozco MRN: [id number] Date of Birth: [birth year]   Medicare Important Message Given:  Yes    Irene Mitcham Abena 03/23/2015, 12:01 PM

## 2015-03-23 NOTE — Progress Notes (Signed)
[id number] Education completed with pt and wife who voiced understanding. Encouraged IS. Discussed CRP 2 and will refer to [facility] program. Had already received Coumadin ed. Encouraged pt to watch sodium.Graylon Good RN BSN 03/23/2015 11:10 AM

## 2015-03-23 NOTE — Discharge Instructions (Signed)
Activity: 1.May walk up steps °               2.No lifting more than ten pounds for four weeks.  °               3.No driving for four weeks. °               4.Stop any activity that causes chest pain, shortness of breath, dizziness, sweating or excessive weakness. °               5.Avoid straining. °               6.Continue with your breathing exercises daily. ° °Diet: Diabetic diet and Low fat, Low salt diet ° °Wound Care: May shower.  Clean wounds with mild soap and water daily. Contact the office at [phone] if any problems arise. ° °Coronary Artery Bypass Grafting, Care After °Refer to this sheet in the next few weeks. These instructions provide you with information on caring for yourself after your procedure. Your health care provider may also give you more specific instructions. Your treatment has been planned according to current medical practices, but problems sometimes occur. Call your health care provider if you have any problems or questions after your procedure. °WHAT TO EXPECT AFTER THE PROCEDURE °Recovery from surgery will be different for everyone. Some people feel well after 3 or 4 weeks, while for others it takes longer. After your procedure, it is typical to have the following: °· Nausea and a lack of appetite.   °· Constipation. °· Weakness and fatigue.   °· Depression or irritability.   °· Pain or discomfort at your incision site. °HOME CARE INSTRUCTIONS °· Take medicines only as directed by your health care provider. Do not stop taking medicines or start any new medicines without first checking with your health care provider. °· Take your pulse as directed by your health care provider. °· Perform deep breathing as directed by your health care provider. If you were given a device called an incentive spirometer, use it to practice deep breathing several times a day. Support your chest with a pillow or your arms when you take deep breaths or cough. °· Keep incision areas clean, dry, and  protected. Remove or change any bandages (dressings) only as directed by your health care provider. You may have skin adhesive strips over the incision areas. Do not take the strips off. They will fall off on their own. °· Check incision areas daily for any swelling, redness, or drainage. °· If incisions were made in your legs, do the following: °· Avoid crossing your legs.   °· Avoid sitting for long periods of time. Change positions every 30 minutes.   °· Elevate your legs when you are sitting. °· Wear compression stockings as directed by your health care provider. These stockings help keep blood clots from forming in your legs. °· Take showers once your health care provider approves. Until then, only take sponge baths. Pat incisions dry. Do not rub incisions with a washcloth or towel. Do not take baths, swim, or use a hot tub until your health care provider approves. °· Eat foods that are high in fiber, such as raw fruits and vegetables, whole grains, beans, and nuts. Meats should be lean cut. Avoid canned, processed, and fried foods. °· Drink enough fluid to keep your urine clear or pale yellow. °· Weigh yourself every day. This helps identify if you are retaining fluid that may make your heart and lungs   work harder.  Rest and limit activity as directed by your health care provider. You may be instructed to:  Stop any activity at once if you have chest pain, shortness of breath, irregular heartbeats, or dizziness. Get help right away if you have any of these symptoms.  Move around frequently for short periods or take short walks as directed by your health care provider. Increase your activities gradually. You may need physical therapy or cardiac rehabilitation to help strengthen your muscles and build your endurance.  Avoid lifting, pushing, or pulling anything heavier than 10 lb (4.5 kg) for at least 6 weeks after surgery.  Do not drive until your health care provider approves.  Ask your health  care provider when you may return to work.  Ask your health care provider when you may resume sexual activity.  Keep all follow-up visits as directed by your health care provider. This is important. SEEK MEDICAL CARE IF:  You have swelling, redness, increasing pain, or drainage at the site of an incision.  You have a fever.  You have swelling in your ankles or legs.  You have pain in your legs.   You gain 2 or more pounds (0.9 kg) a day.  You are nauseous or vomit.  You have diarrhea. SEEK IMMEDIATE MEDICAL CARE IF:  You have chest pain that goes to your jaw or arms.  You have shortness of breath.   You have a fast or irregular heartbeat.   You notice a "clicking" in your breastbone (sternum) when you move.   You have numbness or weakness in your arms or legs.  You feel dizzy or light-headed.  MAKE SURE YOU:  Understand these instructions.  Will watch your condition.  Will get help right away if you are not doing well or get worse.   This information is not intended to replace advice given to you by your health care provider. Make sure you discuss any questions you have with your health care provider.   Document Released: 08/09/2004 Document Revised: 02/10/2014 Document Reviewed: 06/29/2012 Elsevier Interactive Patient Education 2016 [place].  Endoscopic Saphenous Vein Harvesting, Care After Refer to this sheet in the next few weeks. These instructions provide you with information on caring for yourself after your procedure. Your health care provider may also give you more specific instructions. Your treatment has been planned according to current medical practices, but problems sometimes occur. Call your health care provider if you have any problems or questions after your procedure. HOME CARE INSTRUCTIONS Medicine  Take whatever pain medicine your surgeon prescribes. Follow the directions carefully. Do not take over-the-counter pain medicine unless  your surgeon says it is okay. Some pain medicine can cause bleeding problems for several weeks after surgery.  Follow your surgeon's instructions about driving. You will probably not be permitted to drive after heart surgery.  Take any medicines your surgeon prescribes. Any medicines you took before your heart surgery should be checked with your health care provider before you start taking them again. Wound care  If your surgeon has prescribed an elastic bandage or stocking, ask how long you should wear it.  Check the area around your surgical cuts (incisions) whenever your bandages (dressings) are changed. Look for any redness or swelling.  You will need to return to have the stitches (sutures) or staples taken out. Ask your surgeon when to do that.  Ask your surgeon when you can shower or bathe. Activity  Try to keep your legs raised when you  are sitting.  Do any exercises your health care providers have given you. These may include deep breathing exercises, coughing, walking, or other exercises. SEEK MEDICAL CARE IF:  You have any questions about your medicines.  You have more leg pain, especially if your pain medicine stops working.  New or growing bruises develop on your leg.  Your leg swells, feels tight, or becomes red.  You have numbness in your leg. SEEK IMMEDIATE MEDICAL CARE IF:  Your pain gets much worse.  Blood or fluid leaks from any of the incisions.  Your incisions become warm, swollen, or red.  You have chest pain.  You have trouble breathing.  You have a fever.  You have more pain near your leg incision. MAKE SURE YOU:  Understand these instructions.  Will watch your condition.  Will get help right away if you are not doing well or get worse.   This information is not intended to replace advice given to you by your health care provider. Make sure you discuss any questions you have with your health care provider.   Document Released: 10/02/2010  Document Revised: 02/10/2014 Document Reviewed: 10/02/2010 Elsevier Interactive Patient Education [employer].

## 2015-03-23 NOTE — Progress Notes (Signed)
      [address]       [place] [address]             [phone]      9 Days Post-Op Procedure(s) (LRB): CORONARY ARTERY BYPASS GRAFTING (CABG) x 4 (LIMA to LAD, SVG to DIAGONAL 2, SVG SEQUENTIALLY to OM1 and OM2) (N/A) CLIPPING OF ATRIAL APPENDAGE (N/A) TRANSESOPHAGEAL ECHOCARDIOGRAM (TEE) (N/A)   Subjective:  Jonathan Orozco has no complaints.  He is ready to go home.  He is ambulating with minimal assistance.  Objective: Vital signs in last 24 hours: Temp:  [97.6 F (36.4 C)-98.1 F (36.7 C)] 98.1 F (36.7 C) (02/17 0540) Pulse Rate:  [58-82] 69 (02/17 0540) Cardiac Rhythm:  [-] Heart block;Bundle branch block (02/16 1900) Resp:  [18] 18 (02/17 0540) BP: (101-133)/(49-80) 120/78 mmHg (02/17 0540) SpO2:  [94 %-97 %] 94 % (02/17 0540)  Intake/Output from previous day: 02/16 0701 - 02/17 0700 In: 960 [P.O.:960] Out: 1300 [Urine:1300]  General appearance: alert, cooperative and no distress Heart: regular rate and rhythm Lungs: clear to auscultation bilaterally Abdomen: soft, non-tender; bowel sounds normal; no masses,  no organomegaly Extremities: edema trace Wound: clean and dry  Lab Results:  Recent Labs  03/22/15 0350 03/23/15 0320  WBC 9.9 7.7  HGB 10.4* 10.4*  HCT 31.4* 30.7*  PLT 276 294   BMET: No results for input(s): NA, K, CL, CO2, GLUCOSE, BUN, CREATININE, CALCIUM in the last 72 hours.  PT/INR:  Recent Labs  03/23/15 0320  LABPROT 35.1*  INR 3.60*   ABG    Component Value Date/Time   PHART 7.362 03/14/2015 1857   HCO3 22.8 03/14/2015 1857   TCO2 23 03/15/2015 1604   ACIDBASEDEF 2.0 03/14/2015 1857   O2SAT 96.0 03/14/2015 1857   CBG (last 3)  No results for input(s): GLUCAP in the last 72 hours.  Assessment/Plan: S/P Procedure(s) (LRB): CORONARY ARTERY BYPASS GRAFTING (CABG) x 4 (LIMA to LAD, SVG to DIAGONAL 2, SVG SEQUENTIALLY to OM1 and OM2) (N/A) CLIPPING OF ATRIAL APPENDAGE (N/A) TRANSESOPHAGEAL ECHOCARDIOGRAM  (TEE) (N/A)  1. Cv- maintaining NSR- continue Amiodarone, will decrease Lopressor to 50 mg BID 2. Pulm- no acute issues, continue IS 3. INR 3.60, will d/c patient on home regimen of coumadin 4. Renal- weight is mildly elevated, will give a week of Lasix of discharge 5. Dispo- patient stable, will plan to d/c home today   LOS: 11 days    Jonathan Orozco 03/23/2015

## 2015-03-23 NOTE — Progress Notes (Signed)
SUBJECTIVE: The patient is doing well today wants to go home.  At this time, he denies chest pain, shortness of breath, or any new concerns.  [name] amiodarone  400 mg Oral TID  . aspirin EC  81 mg Oral Daily   Or  . aspirin  81 mg Per Tube Daily  . atorvastatin  10 mg Oral Daily  . bisacodyl  10 mg Oral Daily   Or  . bisacodyl  10 mg Rectal Daily  . docusate sodium  200 mg Oral Daily  . furosemide  40 mg Oral Daily  . hydrocortisone cream   Topical BID  . metoprolol tartrate  50 mg Oral TID  . moving right along book   Does not apply Once  . multivitamin with minerals  1 tablet Oral Daily  . omega-3 acid ethyl esters  1 g Oral Daily  . potassium chloride  20 mEq Oral Daily  . sodium chloride flush  3 mL Intravenous Q12H  . warfarin  2.5 mg Oral q1800  . Warfarin - Physician Dosing Inpatient   Does not apply q1800      OBJECTIVE: Physical Exam: Filed Vitals:   03/22/15 1528 03/22/15 1649 03/22/15 1941 03/23/15 0540  BP: 112/49 119/67 133/80 120/78  Pulse: 70 75 71 69  Temp:   97.6 F (36.4 C) 98.1 F (36.7 C)  TempSrc:   Oral Oral  Resp:   18 18  Height:      Weight:      SpO2:   97% 94%    Intake/Output Summary (Last 24 hours) at 03/23/15 1058 Last data filed at 03/23/15 0730  Gross per 24 hour  Intake    840 ml  Output   1550 ml  Net   -710 ml    Telemetry reveals SR, occ APC's  GEN- The patient is well appearing, alert and oriented x 3 today.   Head- normocephalic, atraumatic Eyes-  Sclera clear, conjunctiva pink Ears- hearing intact Oropharynx- clear Neck- supple, no JVP Lungs- Clear to ausculation bilaterally, normal work of breathing Heart- Regular rate and rhythm, no significant murmurs, no rubs or gallops GI- soft, NT, ND Extremities- no clubbing, cyanosis, or edema Skin- no rash or lesion Psych- euthymic mood, full affect Neuro- no gross deficits appreciated  LABS: Basic Metabolic Panel: No results for input(s): NA, K, CL, CO2, GLUCOSE,  BUN, CREATININE, CALCIUM, MG, PHOS in the last 72 hours. Liver Function Tests: No results for input(s): AST, ALT, ALKPHOS, BILITOT, PROT, ALBUMIN in the last 72 hours. No results for input(s): LIPASE, AMYLASE in the last 72 hours. CBC:  Recent Labs  03/22/15 0350 03/23/15 0320  WBC 9.9 7.7  HGB 10.4* 10.4*  HCT 31.4* 30.7*  MCV 93.7 93.9  PLT 276 294      ASSESSMENT AND PLAN:   1. CAD      S/p CABG POD # 9 (LLA clipping)  2. PAfib     SR last 48hours     On amiodarone and metoprolol     D/c on amiodarone 400mg  BID x1 week, then 400mg  daily for 2 weeks, then 200mg  daily     We will see him back in 2-3 weeks, plan for amio surveillence labs     coumadin as per CTS   Tommye Standard, PA-C 03/23/2015 10:58 AM   Holding snus Will arrange Cardiology f/u 2-3 weeks Continue amio 400 bisd x 1 week  400 qd x 2 weeks  Then 200 daily Will need  labs in 6 weeks  Well developed and nourished in no acute distress HENT normal Neck supple with JVP-flat Carotids brisk and full without bruits Clear Regular rate and rhythm,   Abd-soft with active BS without hepatomegaly No Clubbing cyanosis   Skin-warm and dry A & Oriented  Grossly normal sensory and motor function

## 2015-03-23 NOTE — Telephone Encounter (Signed)
Spoke with Mrs Lanclos and she states that pt is being discharged from hospital today and needs to have appt with coumadin clinic on Monday Feb 20th Pt's INR  today Feb 17th was 3.6 so pt's wife states was instructed to hold coumadin today Feb 17th and then on Sat the 18 th and Sunday the 19th take only  1/2 tablet (2.5mg ) then have INR checked on Monday so appt made for Monday and pt's wife confirms time.

## 2015-03-23 NOTE — Evaluation (Signed)
Physical Therapy Evaluation Patient Details Name: Jonathan Orozco MRN: [id number] DOB: 18-Oct-1928 Today's Date: 03/23/2015   History of Present Illness  82 yo male with onset of 4 vessel CABG with RLE graft site, ventricular tachycardia after L heart cath and angiography and TEE  Clinical Impression  Pt was seen for assessment for home of mobility with new RW and to correct posture, instruct sternal precautions and modifications for home mobility.  He had good control of pulse and O2 sat with gait and transfers, discussed equipment needs for home.  Good tolerance overall and will need follow up PT and on to cardiac rehab.    Follow Up Recommendations Home health PT;Supervision/Assistance - 24 hour    Equipment Recommendations  Rolling walker with 5" wheels (shower chair)    Recommendations for Other Services       Precautions / Restrictions Precautions Precautions:  (telemetry ) Restrictions Weight Bearing Restrictions: Yes Other Position/Activity Restrictions: sternal precautions      Mobility  Bed Mobility               General bed mobility comments: up when PT entered  Transfers Overall transfer level: Needs assistance Equipment used: Rolling walker (2 wheeled);1 person hand held assist Transfers: Sit to/from [facility] Sit to Stand: Min assist (instructed wife to assist with back ) Stand pivot transfers: Min guard       General transfer comment: reminders for flexing into sitting and forward shift to stand with pillow  Ambulation/Gait Ambulation/Gait assistance: Min guard Ambulation Distance (Feet): 150 Feet Assistive device: Rolling walker (2 wheeled);1 person hand held assist Gait Pattern/deviations: Step-through pattern;Wide base of support Gait velocity: reduced Gait velocity interpretation: Below normal speed for age/gender General Gait Details: slow pace with controlled exertion due to pt awareness of monitoring his pulse  Stairs             Wheelchair Mobility    Modified Rankin (Stroke Patients Only)       Balance Overall balance assessment: Needs assistance Sitting-balance support: Feet supported Sitting balance-Leahy Scale: Good   Postural control: Posterior lean Standing balance support: Bilateral upper extremity supported Standing balance-Leahy Scale: Fair Standing balance comment: can get fast control of initial standing                             Pertinent Vitals/Pain Pain Assessment: Faces Faces Pain Scale: Hurts little more Pain Location: sternum with movment to stand Pain Descriptors / Indicators: Operative site guarding Pain Intervention(s): Monitored during session;Premedicated before session;Repositioned    Home Living Family/patient expects to be discharged to:: Private residence Living Arrangements: Spouse/significant other Available Help at Discharge: Family;Available 24 hours/day Type of Home: House Home Access: Stairs to enter Entrance Stairs-Rails: Left Entrance Stairs-Number of Steps: 2 + 1 Home Layout: One level Home Equipment: None Additional Comments: needs RW and shower bench    Prior Function Level of Independence: Independent               Hand Dominance   Dominant Hand: Right    Extremity/Trunk Assessment   Upper Extremity Assessment: Generalized weakness (new sternal precautions post CABG)           Lower Extremity Assessment: RLE deficits/detail (R leg graft site for CABG)      Cervical / Trunk Assessment: Normal  Communication   Communication: No difficulties  Cognition Arousal/Alertness: Awake/alert Behavior During Therapy: WFL for tasks assessed/performed Overall Cognitive Status: Within Functional  Limits for tasks assessed                      General Comments General comments (skin integrity, edema, etc.): Pt is noted to have O2 sats 98% wiht pulse 84 initially then down to 97% after walk with pulses 95 post gait  and good telemetry readings    Exercises        Assessment/Plan    PT Assessment Patient needs continued PT services  PT Diagnosis Generalized weakness;Acute pain   PT Problem List Decreased strength;Decreased range of motion;Decreased activity tolerance;Decreased balance;Decreased mobility;Decreased knowledge of use of DME;Cardiopulmonary status limiting activity;Decreased skin integrity;Pain  PT Treatment Interventions DME instruction;Gait training;Stair training;Functional mobility training;Therapeutic activities;Balance training;Therapeutic exercise;Neuromuscular re-education;Patient/family education   PT Goals (Current goals can be found in the Care Plan section) Acute Rehab PT Goals Patient Stated Goal: to walk more and get home PT Goal Formulation: With patient/family Time For Goal Achievement: 03/30/15 Potential to Achieve Goals: Good    Frequency Min 3X/week   Barriers to discharge Inaccessible home environment (stairs with one rail)      Co-evaluation               End of Session Equipment Utilized During Treatment:  (contact with pt on walker by PT) Activity Tolerance: Patient tolerated treatment well;Patient limited by fatigue Patient left: in chair;with call bell/phone within reach;with family/visitor present Nurse Communication: Mobility status         Time: 0825-0901 PT Time Calculation (min) (ACUTE ONLY): 36 min   Charges:   PT Evaluation $PT Eval Moderate Complexity: 1 Procedure PT Treatments $Gait Training: 8-22 mins   PT G CodesRamond Dial 2015/03/24, 9:42 AM   Mee Hives, PT MS Acute Rehab Dept. Number: [facility] [phone] and [place] [phone]

## 2015-03-25 DIAGNOSIS — Z48812 Encounter for surgical aftercare following surgery on the circulatory system: Secondary | ICD-10-CM | POA: Diagnosis not present

## 2015-03-26 ENCOUNTER — Telehealth: Payer: Self-pay | Admitting: Internal Medicine

## 2015-03-26 ENCOUNTER — Ambulatory Visit (INDEPENDENT_AMBULATORY_CARE_PROVIDER_SITE_OTHER): Payer: Medicare Other | Admitting: Cardiology

## 2015-03-26 DIAGNOSIS — Z5181 Encounter for therapeutic drug level monitoring: Secondary | ICD-10-CM

## 2015-03-26 DIAGNOSIS — I4892 Unspecified atrial flutter: Secondary | ICD-10-CM

## 2015-03-26 LAB — POCT INR: INR: 4.7

## 2015-03-26 NOTE — Telephone Encounter (Signed)
New Message  Pt called states that the Home health nurse is coming and the pt wife would rather them take the readings and call them in verses bring the pt out of the home today. Please call back to discuss.

## 2015-03-26 NOTE — Telephone Encounter (Signed)
Advised the wife to ensure that the [facility] RN calls [facility] & she verbalized understanding.

## 2015-03-26 NOTE — Telephone Encounter (Signed)
Returned the call to pt & wife. The wife stated that [facility] will be coming to see the pt around 12noon or 1pm advised that we will gladly take the INR results from the [facility] RN while in the home with pt & that we would dose and give dosing instructions to the [facility] RN. Advised to ensure that the [facility] RN calls [place] [facility] she verbalized understanding.

## 2015-04-01 NOTE — Progress Notes (Signed)
Cardiology Office Note:    Date:  04/02/2015   ID:  Jonathan Orozco, DOB Mar 08, 1928, MRN [id number]  PCP:  [name], MD  Cardiologist/Electrophysiologist:  Dr. Virl Axe   Chief Complaint  Patient presents with  . Hospitalization Follow-up    s/p CABG    History of Present Illness:     Jonathan Orozco is a 82 y.o. male with a hx of paroxysmal atrial flutter, ventricular tachycardia, outflow tracts ectopy, HTN. He has previously undergone EP testing and 3-D mapping which is failed to produce stable arrhythmias. He's been treated with medical therapy.   He was seen by Dr. [name] 03/07/15. Patient complain of symptoms consistent with crescendo angina and cardiac catheterization was arranged. LHC demonstrated severe left main, LCx and LAD disease. He was seen by Dr. Roxan Hockey and underwent CABG 03/14/15 (LIMA-LAD, SVG-D2, SVG-OM1/OM2). Postoperative course was notable for recurrent atrial fibrillation with rapid ventricular rate. Coumadin was resumed. Cardioversion was planned but the patient converted to NSR prior to proceeding with procedure. He was placed on amiodarone. Plan is to keep him on amiodarone 400 mg twice a day for a week, then 400 mg daily 2 weeks, then 200 mg daily. Follow-up labs and PFTs in 6 weeks.    He returns for follow-up.  Here today with his wife. He has home health nursing and physical therapy coming to his house. He's been doing very well. He's walking about 10 minutes twice a day now. Denies shortness of breath. Denies significant chest soreness. Denies syncope or dizziness. Denies orthopnea, PND or edema. His right leg is somewhat larger. This was the leg from which his SVG was harvested.  He denies fevers. He has had a mild nonproductive cough.   Past Medical History  Diagnosis Date  . HYPERLIPIDEMIA 09/29/2006  . ERECTILE DYSFUNCTION 09/29/2006  . 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 ABUSE, ALCOHOL, IN REMISSION 04/08/2007  . Other specified forms of hearing  loss 08/10/2009  . HYPERTENSION 09/29/2006  . VENTRICULAR TACHYCARDIA 09/29/2006  . ALLERGIC RHINITIS 09/29/2006  . BENIGN PROSTATIC HYPERTROPHY 09/29/2006  . OSTEOARTHROSIS NOS, OTHER [facility] SITE 09/29/2006  . BPPV (benign paroxysmal positional vertigo) 04/08/2007  . COLONIC POLYPS, HX OF 06/23/2007  . CAD (coronary artery disease)     a. LHC 2/17: EF 55-65%, LM 75, pLAD 75, oD1 75, oD2 65, D3 85, oLCx 99, oOM1 75, pRCA 25 >> CABG  . Carotid stenosis     a. Carotid US 2/17:  Bilateral ICA 1-39% ICA  . GLUCOSE INTOLERANCE 03/19/2010  . Atrial flutter ([place]) 03/28/2010    a. s/p prior rfca;  b. recurrent paroxysmal flutter 04/2012;  c. pradaxa initiated 04/2012.  [name] Long term (current) use of anticoagulants 04/26/2010  . Diverticulosis   . MELANOMA, MALIGNANT, SKIN NOS 09/29/2006    other skin cancers -no further melanoma    Past Surgical History  Procedure Laterality Date  . Left rotato cuff    . Joint replacement      Right knee  . Tonsillectomy    . Left knee arthroscopy    . Cataract extraction, bilateral Bilateral   . Colonoscopy with propofol N/A 12/08/2014    Procedure: COLONOSCOPY WITH PROPOFOL;  Surgeon: [name], MD;  Location: [facility];  Service: Endoscopy;  Laterality: N/A;  . Cardiac catheterization N/A 03/12/2015    Procedure: Left Heart Cath and Coronary Angiography;  Surgeon: [name], MD;  Location: [place] CV LAB;  Service: Cardiovascular;  Laterality: N/A;  .  Coronary artery bypass graft N/A 03/14/2015    Procedure: CORONARY ARTERY BYPASS GRAFTING (CABG) x 4 (LIMA to LAD, SVG to DIAGONAL 2, SVG SEQUENTIALLY to OM1 and OM2);  Surgeon: [name], MD;  Location: [place];  Service: Open Heart Surgery;  Laterality: N/A;  . Clipping of atrial appendage N/A 03/14/2015    Procedure: CLIPPING OF ATRIAL APPENDAGE;  Surgeon: [name], MD;  Location: [place];  Service: Open Heart Surgery;  Laterality: N/A;  . Tee without cardioversion N/A 03/14/2015    Procedure:  TRANSESOPHAGEAL ECHOCARDIOGRAM (TEE);  Surgeon: [name], MD;  Location: [place];  Service: Open Heart Surgery;  Laterality: N/A;    Current Medications: Outpatient Prescriptions Prior to Visit  Medication Sig Dispense Refill  . ALPRAZolam (XANAX) 0.5 MG tablet Take 1 tablet (0.5 mg total) by mouth daily as needed for anxiety. 30 tablet 2  . aspirin EC 81 MG EC tablet Take 1 tablet (81 mg total) by mouth daily.    [name] atorvastatin (LIPITOR) 10 MG tablet Take 1 tablet (10 mg total) by mouth daily. 90 tablet 3  . fish oil-omega-3 fatty acids 1000 MG capsule Take 1 g by mouth daily.     [name] HYDROcodone-acetaminophen (NORCO/VICODIN) 5-325 MG tablet Take 1 tablet by mouth every 6 (six) hours as needed for moderate pain (body pain).    . metoprolol (LOPRESSOR) 50 MG tablet Take 1 tablet (50 mg total) by mouth 2 (two) times daily. 60 tablet 3  . Multiple Vitamin (MULTIVITAMIN) tablet Take 1 tablet by mouth daily.      . Probiotic Product (RESTORA) CAPS Take 1 capsule by mouth daily. 10 capsule 0  . psyllium (METAMUCIL) 58.6 % packet Take 1 packet by mouth daily.    [name] amiodarone (PACERONE) 400 MG tablet Take 1 tablet (400 mg total) by mouth 2 (two) times daily. For one week;then take Amiodarone 400 mg daily by mouth thereafter 60 tablet 1  . warfarin (COUMADIN) 5 MG tablet Take 1/2 tablet every evening or as directed 30 tablet 1  . furosemide (LASIX) 40 MG tablet Take 1 tablet (40 mg total) by mouth daily. For 7 days 7 tablet 0  . HYDROcodone-acetaminophen (NORCO/VICODIN) 5-325 MG tablet Take 1 tablet by mouth every 4 (four) hours as needed for moderate pain. 30 tablet 0  . potassium chloride SA (K-DUR,KLOR-CON) 20 MEQ tablet Take 1 tablet (20 mEq total) by mouth daily. For 7 days 7 tablet 0   No facility-administered medications prior to visit.     Allergies:   Ace inhibitors and Amiodarone hcl   Social History   Social History  . Marital Status: Married    Spouse Name: N/A  . Number of  Children: N/A  . Years of Education: N/A   Social History Main Topics  . Smoking status: Former Research scientist (life sciences)  . Smokeless tobacco: Never Used     Comment: Quit smoking and drinking 20 years ago  . Alcohol Use: No  . Drug Use: No  . Sexual Activity: Not Asked   Other Topics Concern  . None   Social History Narrative   Married, 3 children. Retired Press photographer. Lives in [place] with  wife and kids. He is retired from Press photographer.      Family History:  The patient's family history includes Diabetes in his brother; Esophageal cancer in his father. There is no history of Colon cancer or Stomach cancer.   ROS:   Please see the history of present illness.  Review of Systems  Respiratory: Positive for cough.   All other systems reviewed and are negative.   Physical Exam:    VS:  BP 112/54 mmHg  Pulse 58  Ht 6' (1.829 m)  Wt 170 lb 12.8 oz (77.474 kg)  BMI 23.16 kg/m2   GEN: Well nourished, well developed, in no acute distress HEENT: normal Neck: no JVD, no masses Cardiac: Normal S1/S2, RRR; no murmurs,  no edema;     Chest: Median sternotomy well healed without erythema or discharge Respiratory:  clear to auscultation bilaterally; no wheezing, rhonchi or rales GI: soft, nontender  MS: no deformity or atrophy Skin: warm and dry,  Neuro:   no focal deficits  Psych: Alert and oriented x 3, normal affect  Wt Readings from Last 3 Encounters:  04/02/15 170 lb 12.8 oz (77.474 kg)  03/22/15 181 lb (82.101 kg)  03/07/15 179 lb 2 oz (81.251 kg)      Studies/Labs Reviewed:     EKG:  EKG is  ordered today.  The ekg ordered today demonstrates sinus bradycardia, HR 58, normal axis, RBBB, QTc 490 ms, no significant changes.    Recent Labs: 05/31/2014: TSH 0.85 10/27/2014: ALT 17 03/15/2015: Magnesium 2.1 03/20/2015: BUN 10; Creatinine, Ser 0.74; Potassium 3.8; Sodium 131* 03/23/2015: Hemoglobin 10.4*; Platelets 294   Recent Lipid Panel    Component Value Date/Time   CHOL 195 10/27/2014 1026   TRIG  46.0 10/27/2014 1026   HDL 57.20 10/27/2014 1026   CHOLHDL 3 10/27/2014 1026   VLDL 9.2 10/27/2014 1026   LDLCALC 129* 10/27/2014 1026   LDLDIRECT 159.2 09/29/2006 1112    Additional studies/ records that were reviewed today include:   LHC 03/12/15 LM 75% LAD proximal 75%, D1 ostial 75%, D2 ostial 65%, D3 85% LCx ostial 99%, OM1 ostial 75% RCA proximal 25% EF 55-65% RECOMMENDATIONS: Elderly but fit a 48-year-old gentleman with severe left main, circumflex, and LAD disease. Class III CCS functional status and progressive over the past 6 weeks. We'll admit to the hospital, place on IV heparin, get TCTS consult to consider revascularization options.  Carotid US 03/13/15 Bilateral ICA 1-39% ICA    ASSESSMENT:     1. Paroxysmal atrial fibrillation (HCC)   2. Coronary artery disease involving native coronary artery of native heart without angina pectoris   3. Essential hypertension   4. Hyperlipidemia     PLAN:     In order of problems listed above:  1. CAD - s/p CABG. He is doing very well post bypass. He is making excellent progress. He will FU with Dr. Roxan Hockey soon.  Continue ASA, statin, beta-blocker.  Refer to Cardiac Rehab (to start once HHPT is completed).  2. PAF - He remains on Amiodarone, Coumadin. Maintaining NSR. He was DC on Amio 400 mg bid x 1 week, then decreased to 400 mg QD x 2 weeks.  -  Remain on Amio 400 mg QD through 3/10, then decrease to 200 mg QD.    -  PFTs done pre-CABG.  -  Obtain FU LFTs and TSH in 6-8 weeks.  -  Patient not eager to remain on Amio.  Consider DC 2-3 mos post CABG.  Will defer decision to Dr. Virl Axe.  3. HTN - Controlled.   4. HL - Continue statin.     Medication Adjustments/Labs and Tests Ordered: Current medicines are reviewed at length with the patient today.  Concerns regarding medicines are outlined above.  Medication changes, Labs and Tests ordered today  are outlined in the Patient Instructions noted below. Patient  Instructions  Medication Instructions:  STARTING ON 04/13/15 YOU WILL DECREASE AMIODARONE TO 200 MG DAILY  Labwork: TSH, LFT ON 05/14/15 WHEN YOU SEE DR. KLEIN  Testing/Procedures: NONE  Follow-Up: DR. [name] ON 05/14/15  Any Other Special Instructions Will Be Listed Below (If Applicable).  If you need a refill on your cardiac medications before your next appointment, please call your pharmacy.   Signed, Richardson Dopp, PA-C  04/02/2015 12:26 PM    [facility] [address], [place], [place]  [address] Phone: [phone]; Fax: [phone]

## 2015-04-02 ENCOUNTER — Encounter: Payer: Self-pay | Admitting: Physician Assistant

## 2015-04-02 ENCOUNTER — Ambulatory Visit (INDEPENDENT_AMBULATORY_CARE_PROVIDER_SITE_OTHER): Payer: Medicare Other | Admitting: Physician Assistant

## 2015-04-02 VITALS — BP 112/54 | HR 58 | Ht 72.0 in | Wt 170.8 lb

## 2015-04-02 DIAGNOSIS — E785 Hyperlipidemia, unspecified: Secondary | ICD-10-CM | POA: Diagnosis not present

## 2015-04-02 DIAGNOSIS — I10 Essential (primary) hypertension: Secondary | ICD-10-CM | POA: Diagnosis not present

## 2015-04-02 DIAGNOSIS — I480 Paroxysmal atrial fibrillation: Secondary | ICD-10-CM

## 2015-04-02 DIAGNOSIS — I2510 Atherosclerotic heart disease of native coronary artery without angina pectoris: Secondary | ICD-10-CM

## 2015-04-02 DIAGNOSIS — I4891 Unspecified atrial fibrillation: Secondary | ICD-10-CM

## 2015-04-02 MED ORDER — AMIODARONE HCL 200 MG PO TABS: 400.0000 mg | ORAL_TABLET | Freq: Every day | ORAL | Status: DC

## 2015-04-02 NOTE — Patient Instructions (Addendum)
Medication Instructions:  STARTING ON 04/13/15 YOU WILL DECREASE AMIODARONE TO 200 MG DAILY  Labwork: TSH, LFT ON 05/14/15 WHEN YOU SEE DR. KLEIN  Testing/Procedures: NONE  Follow-Up: DR. [name] ON 05/14/15  Any Other Special Instructions Will Be Listed Below (If Applicable).  If you need a refill on your cardiac medications before your next appointment, please call your pharmacy.

## 2015-04-03 ENCOUNTER — Telehealth: Payer: Self-pay | Admitting: Internal Medicine

## 2015-04-03 ENCOUNTER — Ambulatory Visit (INDEPENDENT_AMBULATORY_CARE_PROVIDER_SITE_OTHER): Payer: Medicare Other | Admitting: Cardiovascular Disease

## 2015-04-03 DIAGNOSIS — I4892 Unspecified atrial flutter: Secondary | ICD-10-CM

## 2015-04-03 DIAGNOSIS — Z5181 Encounter for therapeutic drug level monitoring: Secondary | ICD-10-CM

## 2015-04-03 LAB — POCT INR: INR: 4.9

## 2015-04-03 NOTE — Telephone Encounter (Signed)
New message      Calling to report that when pt was seen today---patient c/o fatigue, HR 47 at rest, dry cough and loss 4 lbs since last week.  Please advise

## 2015-04-03 NOTE — Telephone Encounter (Deleted)
Pt is C/O of the same Symptoms when he was seen by scot weaver PA yesterday (2/27/ Jinny Blossom is aware.

## 2015-04-03 NOTE — Telephone Encounter (Addendum)
Megan from [facility] called to let Dr. [name] know about pt's status. She states that pt's wife took pt's heart rate in AM at rest his HR at rest was  48 beats/ minute. When nurse came in and checked pt's HR when he was up and about HR 57 beats/minute. Pt yesterday and today C/O of weakness fatigue. Pt also has been having a dry cough for the last 2 days. Pt was seen in this office yesterday by Richardson Dopp PA , his HR was 58 beats/minute. Pt C/O of the same symptoms as when he was seen in [facility] yesterday (2/27). I left Megan from [facility] a message regarding this issue.

## 2015-04-05 ENCOUNTER — Telehealth: Payer: Self-pay | Admitting: Internal Medicine

## 2015-04-05 NOTE — Telephone Encounter (Signed)
States patient is having issues with nausea and would like a script to help with this.  Patient uses [facility].

## 2015-04-06 NOTE — Telephone Encounter (Signed)
Has he tried any over the counter medications like peptobismol?

## 2015-04-06 NOTE — Telephone Encounter (Signed)
Please advise, patient is having trouble with nausea would you feel comfortable prescribing something?

## 2015-04-10 ENCOUNTER — Ambulatory Visit (INDEPENDENT_AMBULATORY_CARE_PROVIDER_SITE_OTHER): Payer: Medicare Other | Admitting: Interventional Cardiology

## 2015-04-10 DIAGNOSIS — Z5181 Encounter for therapeutic drug level monitoring: Secondary | ICD-10-CM

## 2015-04-10 DIAGNOSIS — I4892 Unspecified atrial flutter: Secondary | ICD-10-CM

## 2015-04-10 LAB — PROTIME-INR
INR: 5.62 — AB (ref 0.00–1.49)
PROTHROMBIN TIME: 49.1 s — AB (ref 11.6–15.2)

## 2015-04-10 LAB — POCT INR: INR: 7.7

## 2015-04-11 ENCOUNTER — Ambulatory Visit: Payer: Medicare Other | Admitting: Internal Medicine

## 2015-04-13 ENCOUNTER — Ambulatory Visit (INDEPENDENT_AMBULATORY_CARE_PROVIDER_SITE_OTHER): Payer: Medicare Other | Admitting: Pharmacist

## 2015-04-13 DIAGNOSIS — I4892 Unspecified atrial flutter: Secondary | ICD-10-CM | POA: Diagnosis not present

## 2015-04-13 DIAGNOSIS — Z5181 Encounter for therapeutic drug level monitoring: Secondary | ICD-10-CM | POA: Diagnosis not present

## 2015-04-13 LAB — POCT INR: INR: 3.7

## 2015-04-16 ENCOUNTER — Other Ambulatory Visit: Payer: Self-pay | Admitting: Thoracic Surgery (Cardiothoracic Vascular Surgery)

## 2015-04-16 DIAGNOSIS — Z951 Presence of aortocoronary bypass graft: Secondary | ICD-10-CM

## 2015-04-17 ENCOUNTER — Ambulatory Visit
Admission: RE | Admit: 2015-04-17 | Discharge: 2015-04-17 | Disposition: A | Discharge status: Home / Self Care | Payer: Medicare Other | Source: Ambulatory Visit | Attending: Thoracic Surgery (Cardiothoracic Vascular Surgery) | Admitting: Thoracic Surgery (Cardiothoracic Vascular Surgery)

## 2015-04-17 ENCOUNTER — Encounter: Payer: Self-pay | Admitting: Thoracic Surgery (Cardiothoracic Vascular Surgery)

## 2015-04-17 ENCOUNTER — Ambulatory Visit (INDEPENDENT_AMBULATORY_CARE_PROVIDER_SITE_OTHER): Payer: Self-pay | Admitting: Thoracic Surgery (Cardiothoracic Vascular Surgery)

## 2015-04-17 VITALS — BP 156/80 | HR 60 | Resp 16 | Ht 72.0 in | Wt 164.2 lb

## 2015-04-17 DIAGNOSIS — I25110 Atherosclerotic heart disease of native coronary artery with unstable angina pectoris: Secondary | ICD-10-CM

## 2015-04-17 DIAGNOSIS — Z951 Presence of aortocoronary bypass graft: Secondary | ICD-10-CM

## 2015-04-17 DIAGNOSIS — I483 Typical atrial flutter: Secondary | ICD-10-CM

## 2015-04-17 NOTE — Progress Notes (Signed)
Jonathan Orozco       Jonathan Orozco,Jonathan Orozco             [phone]       HPI: Jonathan Orozco returns today for scheduled postoperative follow-up visit.  He is an 82 year old man who underwent coronary bypass grafting 4 and left atrial clip placement on 03/14/2015. He had a lot of issues with atrial fibrillation and flutter postoperatively. He ultimately was discharged on metoprolol and amiodarone. He went home on 03/23/2015.  He hasn't had much pain since discharge and is not taking any narcotics at this point. He is walking about 30 minutes a day. He was having a lot of problems with nausea but he and his wife say that has improved significantly over the past week or so. Saw cardiology on February 26. He is down to 200 mg daily on amiodarone. He is anxious to increase his activities.  We will start cardiac rehabilitation next week  Past Medical History  Diagnosis Date  . HYPERLIPIDEMIA 09/29/2006  . ERECTILE DYSFUNCTION 09/29/2006  . 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 ABUSE, ALCOHOL, IN REMISSION 04/08/2007  . Other specified forms of hearing loss 08/10/2009  . HYPERTENSION 09/29/2006  . VENTRICULAR TACHYCARDIA 09/29/2006  . ALLERGIC RHINITIS 09/29/2006  . BENIGN PROSTATIC HYPERTROPHY 09/29/2006  . OSTEOARTHROSIS NOS, OTHER [facility] SITE 09/29/2006  . BPPV (benign paroxysmal positional vertigo) 04/08/2007  . COLONIC POLYPS, HX OF 06/23/2007  . CAD (coronary artery disease)     a. LHC 2/17: EF 55-65%, LM 75, pLAD 75, oD1 75, oD2 65, D3 85, oLCx 99, oOM1 75, pRCA 25 >> CABG  . Carotid stenosis     a. Carotid US 2/17:  Bilateral ICA 1-39% ICA  . GLUCOSE INTOLERANCE 03/19/2010  . Atrial flutter ([place]) 03/28/2010    a. s/p prior rfca;  b. recurrent paroxysmal flutter 04/2012;  c. pradaxa initiated 04/2012.  [name] Long term (current) use of anticoagulants 04/26/2010  . Diverticulosis   . MELANOMA, MALIGNANT, SKIN NOS 09/29/2006    other skin cancers -no further melanoma      Current Outpatient Prescriptions  Medication Sig Dispense Refill  . amiodarone (PACERONE) 200 MG tablet Take 2 tablets (400 mg total) by mouth daily. Starting 04/13/15 decrease to 200 mg daily    . aspirin EC 81 MG EC tablet Take 1 tablet (81 mg total) by mouth daily.    [name] atorvastatin (LIPITOR) 10 MG tablet Take 1 tablet (10 mg total) by mouth daily. 90 tablet 3  . fish oil-omega-3 fatty acids 1000 MG capsule Take 1 g by mouth daily.     [name] HYDROcodone-acetaminophen (NORCO/VICODIN) 5-325 MG tablet Take 1 tablet by mouth every 6 (six) hours as needed for moderate pain (body pain).    . metoprolol (LOPRESSOR) 50 MG tablet Take 1 tablet (50 mg total) by mouth 2 (two) times daily. 60 tablet 3  . Multiple Vitamin (MULTIVITAMIN) tablet Take 1 tablet by mouth daily.      . Probiotic Product (RESTORA) CAPS Take 1 capsule by mouth daily. 10 capsule 0  . psyllium (METAMUCIL) 58.6 % packet Take 1 packet by mouth daily.    [name] warfarin (COUMADIN) 5 MG tablet Take 5 mg by mouth daily. AS DIRECTED     No current facility-administered medications for this visit.    Physical Exam BP 156/80 mmHg  Pulse 60  Resp 16  Ht 6' (1.829 m)  Wt 164 lb 3.2 oz (74.481 kg)  BMI 22.26 kg/m2  SpO22 30% 82 year old man in no acute distress Alert and oriented 3 with no focal deficits Lungs clear with equal breath was bilaterally Cardiac bradycardic, regular, no murmur Sternum stable, incision clean dry and intact Leg incisions healing well, no peripheral edema  Diagnostic Tests: CHEST 2 VIEW  COMPARISON: 03/18/2015  FINDINGS: Cardiac shadow is stable. Postsurgical changes are again seen and stable. Lungs are well aerated bilaterally. No focal infiltrate is noted. Very minimal blunting of the costophrenic angles is seen. There is been significant improved aeration in the left base.  IMPRESSION: Minimal residual pleural effusion. No focal infiltrate is seen.   Electronically Signed  By:  Inez Catalina M.D.  On: 04/17/2015 09:41 I personally reviewed the chest x-ray her with findings noted above  Impression: Jonathan Orozco is an 82 year old man who had coronary bypass grafting and left atrial appendage clipping on 03/14/2015. He is doing well at this time.   He had a lot of nausea initially, but that is resolving. His appetite is slowly improving. I cautioned him that this often takes several weeks after surgery for the appetite to return to normal.  He's having minimal discomfort.  I cautioned him not to lift anything over 10 pounds for another week and nothing over 20 pounds for 2 weeks after that. Beyond that his activities are unrestricted. He may drive. Appropriate precautions were discussed.  He is down to 200 mg daily on amiodarone. That will likely be stopped when he sees Dr. [name] back in a month.  His blood pressure was elevated today at 156/80. He had been on diltiazem previously. I do not want to restart that yet because his heart rate is still on the low side at 60, but that is something that needs to be followed up when he sees Dr. [name].  Plan: Follow-up with Dr. [name] is scheduled.  I'll be happy to see Jonathan Orozco back any time the future if I can be of any further assistance with his care.  [name], MD [facility] [phone]

## 2015-04-24 ENCOUNTER — Encounter (HOSPITAL_COMMUNITY): Payer: Self-pay

## 2015-04-24 ENCOUNTER — Ambulatory Visit (INDEPENDENT_AMBULATORY_CARE_PROVIDER_SITE_OTHER): Payer: Medicare Other | Admitting: *Deleted

## 2015-04-24 ENCOUNTER — Encounter (HOSPITAL_COMMUNITY)
Admission: RE | Admit: 2015-04-24 | Discharge: 2015-04-24 | Disposition: A | Discharge status: Home / Self Care | Payer: Medicare Other | Source: Ambulatory Visit | Attending: Internal Medicine | Admitting: Internal Medicine

## 2015-04-24 ENCOUNTER — Ambulatory Visit: Payer: Medicare Other | Admitting: Physician Assistant

## 2015-04-24 VITALS — BP 132/70 | HR 64 | Ht 70.0 in | Wt 166.7 lb

## 2015-04-24 DIAGNOSIS — Z5181 Encounter for therapeutic drug level monitoring: Secondary | ICD-10-CM | POA: Diagnosis not present

## 2015-04-24 DIAGNOSIS — I483 Typical atrial flutter: Secondary | ICD-10-CM

## 2015-04-24 DIAGNOSIS — Z951 Presence of aortocoronary bypass graft: Secondary | ICD-10-CM

## 2015-04-24 DIAGNOSIS — I4892 Unspecified atrial flutter: Secondary | ICD-10-CM | POA: Diagnosis not present

## 2015-04-24 DIAGNOSIS — F411 Generalized anxiety disorder: Secondary | ICD-10-CM | POA: Diagnosis not present

## 2015-04-24 LAB — POCT INR: INR: 5.8

## 2015-04-24 NOTE — Progress Notes (Signed)
Cardiac Individual Treatment Plan  Patient Details  Name: Jonathan Orozco MRN: [id number] Date of Birth: 1928-07-20 Referring Provider:  Deboraha Sprang, MD  Initial Encounter Date:       CARDIAC REHAB PHASE II ORIENTATION from 04/24/2015 in [facility]   Date  04/24/15      Visit Diagnosis: S/P CABG x 4  Patient's Home Medications on Admission:  Current outpatient prescriptions:  .  ALPRAZolam (XANAX) 0.5 MG tablet, Take 1 tablet by mouth daily as needed., Disp: , Rfl:  .  amiodarone (PACERONE) 200 MG tablet, Take 200 mg by mouth daily., Disp: , Rfl:  .  aspirin EC 81 MG EC tablet, Take 1 tablet (81 mg total) by mouth daily., Disp: , Rfl:  .  atorvastatin (LIPITOR) 10 MG tablet, Take 1 tablet (10 mg total) by mouth daily., Disp: 90 tablet, Rfl: 3 .  fish oil-omega-3 fatty acids 1000 MG capsule, Take 1 g by mouth daily. , Disp: , Rfl:  .  HYDROcodone-acetaminophen (NORCO/VICODIN) 5-325 MG tablet, Take 1 tablet by mouth every 6 (six) hours as needed for moderate pain (body pain)., Disp: , Rfl:  .  metoprolol (LOPRESSOR) 50 MG tablet, Take 1 tablet (50 mg total) by mouth 2 (two) times daily., Disp: 60 tablet, Rfl: 3 .  Multiple Vitamin (MULTIVITAMIN) tablet, Take 1 tablet by mouth daily.  , Disp: , Rfl:  .  Probiotic Product (RESTORA) CAPS, Take 1 capsule by mouth daily., Disp: 10 capsule, Rfl: 0 .  psyllium (METAMUCIL) 58.6 % packet, Take 1 packet by mouth daily., Disp: , Rfl:  .  warfarin (COUMADIN) 5 MG tablet, Take 5 mg by mouth daily. AS DIRECTED, Disp: , Rfl:   Past Medical History: Past Medical History  Diagnosis Date  . HYPERLIPIDEMIA 09/29/2006  . ERECTILE DYSFUNCTION 09/29/2006  . 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 ABUSE, ALCOHOL, IN REMISSION 04/08/2007  . Other specified forms of hearing loss 08/10/2009  . HYPERTENSION 09/29/2006  . VENTRICULAR TACHYCARDIA 09/29/2006  . ALLERGIC RHINITIS 09/29/2006  . BENIGN PROSTATIC HYPERTROPHY 09/29/2006   . OSTEOARTHROSIS NOS, OTHER [facility] SITE 09/29/2006  . BPPV (benign paroxysmal positional vertigo) 04/08/2007  . COLONIC POLYPS, HX OF 06/23/2007  . CAD (coronary artery disease)     a. LHC 2/17: EF 55-65%, LM 75, pLAD 75, oD1 75, oD2 65, D3 85, oLCx 99, oOM1 75, pRCA 25 >> CABG  . Carotid stenosis     a. Carotid US 2/17:  Bilateral ICA 1-39% ICA  . GLUCOSE INTOLERANCE 03/19/2010  . Atrial flutter ([place]) 03/28/2010    a. s/p prior rfca;  b. recurrent paroxysmal flutter 04/2012;  c. pradaxa initiated 04/2012.  [name] Long term (current) use of anticoagulants 04/26/2010  . Diverticulosis   . MELANOMA, MALIGNANT, SKIN NOS 09/29/2006    other skin cancers -no further melanoma    Tobacco Use: History  Smoking status  . Former Smoker  Smokeless tobacco  . Never Used    Comment: Quit smoking and drinking 20 years ago    Labs:     Recent Review [place] for [facility]P Cardiac and Pulmonary Rehab Latest Ref Rng 03/14/2015 03/14/2015 03/14/2015 03/14/2015 03/15/2015   PHART 7.350 - 7.450 7.475(H) 7.326(L) 7.362 - -   PCO2ART 35.0 - 45.0 mmHg 32.5(L) 44.0 40.3 - -   HCO3 20.0 - 24.0 mEq/L 24.3(H) 23.0 22.8 - -   TCO2 0 - 100 mmol/L 25 24 24 23  23  ACIDBASEDEF 0.0 - 2.0 mmol/L - 3.0(H) 2.0 - -   O2SAT - 93.0 97.0 96.0 - -      Capillary Blood Glucose: Lab Results  Component Value Date   GLUCAP 99 03/18/2015   GLUCAP 110* 03/17/2015   GLUCAP 96 03/17/2015   GLUCAP 112* 03/17/2015   GLUCAP 118* 03/17/2015     Exercise Target Goals: Date: 04/24/15  Exercise Program Goal: Individual exercise prescription set with THRR, safety & activity barriers. Participant demonstrates ability to understand and report RPE using BORG scale, to self-measure pulse accurately, and to acknowledge the importance of the exercise prescription.  Exercise Prescription Goal: Starting with aerobic activity 30 plus minutes a day, 3 days per week for initial exercise prescription. Provide home exercise prescription and  guidelines that participant acknowledges understanding prior to discharge.  Activity Barriers & Risk Stratification:     Activity Barriers & Cardiac Risk Stratification - 04/24/15 1359    Activity Barriers & Cardiac Risk Stratification   Activity Barriers Back Problems;Deconditioning;Muscular Weakness;Right Knee Replacement;Balance Concerns;Assistive Device   Cardiac Risk Stratification High      6 Minute Walk:     6 Minute Walk      04/24/15 1506       6 Minute Walk   Phase Initial     Distance 1400 feet     Walk Time 6 minutes     # of Rest Breaks 0     MPH 2.65     METS 2.35     RPE 13     VO2 Peak 8.24     Symptoms No     Resting HR 64 bpm     Resting BP 132/70 mmHg     Max Ex. HR 81 bpm     Max Ex. BP 132/64 mmHg        Initial Exercise Prescription:     Initial Exercise Prescription - 04/24/15 1500    Date of Initial Exercise Prescription   Date 04/24/15   Bike   Level 0.5   Minutes 10   METs 2.3   NuStep   Level 2   Minutes 10   METs 2   Track   Laps 8   Minutes 10   METs 2.38   Prescription Details   Frequency (times per week) 3   Duration Progress to 30 minutes of continuous aerobic without signs/symptoms of physical distress   Intensity   THRR 40-80% of Max Heartrate 54-108   Ratings of Perceived Exertion 11-13   Progression   Progression Continue to progress workloads to maintain intensity without signs/symptoms of physical distress.   Resistance Training   Training Prescription Yes   Weight 2 lbs   Reps 10-12      Perform Capillary Blood Glucose checks as needed.  Exercise Prescription Changes:   Exercise Comments:   Discharge Exercise Prescription (Final Exercise Prescription Changes):   Nutrition:  Target Goals: Understanding of nutrition guidelines, daily intake of sodium 1500mg , cholesterol 200mg , calories 30% from fat and 7% or less from saturated fats, daily to have 5 or more servings of fruits and  vegetables.  Biometrics:     Pre Biometrics - 04/24/15 1521    Pre Biometrics   Waist Circumference 36.75 inches   Hip Circumference 36 inches   Waist to Hip Ratio 1.02 %   Triceps Skinfold 10 mm   % Body Fat 23.4 %   Grip Strength 36 kg   Flexibility 11 in   Single Leg Stand  14.8 seconds       Nutrition Therapy Plan and Nutrition Goals:   Nutrition Discharge: Rate Your Plate Scores:   Nutrition Goals Re-Evaluation:   Psychosocial: Target Goals: Acknowledge presence or absence of depression, maximize coping skills, provide positive support system. Participant is able to verbalize types and ability to use techniques and skills needed for reducing stress and depression.  Initial Review & Psychosocial Screening:     Initial Psych Review & Screening - 04/24/15 1530    Family Dynamics   Good Support System? Yes   Barriers   Psychosocial barriers to participate in program There are no identifiable barriers or psychosocial needs.   Screening Interventions   Interventions Encouraged to exercise      Quality of Life Scores:     Quality of Life - 04/24/15 1548    Quality of Life Scores   Health/Function Pre 23.64 %   Socioeconomic Pre 22.4 %   Psych/Spiritual Pre 22.5 %   Family Pre 23 %   GLOBAL Pre 23.13 %      PHQ-9:     Recent Review Flowsheet Data    Depression screen [facility] 2/9 04/24/2015 05/31/2014 05/24/2013   Decreased Interest 0 0 0   Down, Depressed, Hopeless 0 1 1   PHQ - 2 Score 0 1 1      Psychosocial Evaluation and Intervention:   Psychosocial Re-Evaluation:   Vocational Rehabilitation: Provide vocational rehab assistance to qualifying candidates.   Vocational Rehab Evaluation & Intervention:   Education: Education Goals: Education classes will be provided on a weekly basis, covering required topics. Participant will state understanding/return demonstration of topics presented.  Learning Barriers/Preferences:     Learning  Barriers/Preferences - 04/24/15 1522    Learning Barriers/Preferences   Learning Barriers Hearing;Sight  reading glasses, hearing aids   Learning Preferences Written Material;Skilled Demonstration      Education Topics: Count Your Pulse:  -Group instruction provided by verbal instruction, demonstration, patient participation and written materials to support subject.  Instructors address importance of being able to find your pulse and how to count your pulse when at home without a heart monitor.  Patients get hands on experience counting their pulse with staff help and individually.   Heart Attack, Angina, and Risk Factor Modification:  -Group instruction provided by verbal instruction, video, and written materials to support subject.  Instructors address signs and symptoms of angina and heart attacks.    Also discuss risk factors for heart disease and how to make changes to improve heart health risk factors.   Functional Fitness:  -Group instruction provided by verbal instruction, demonstration, patient participation, and written materials to support subject.  Instructors address safety measures for doing things around the house.  Discuss how to get up and down off the floor, how to pick things up properly, how to safely get out of a chair without assistance, and balance training.   Meditation and Mindfulness:  -Group instruction provided by verbal instruction, patient participation, and written materials to support subject.  Instructor addresses importance of mindfulness and meditation practice to help reduce stress and improve awareness.  Instructor also leads participants through a meditation exercise.    Stretching for Flexibility and Mobility:  -Group instruction provided by verbal instruction, patient participation, and written materials to support subject.  Instructors lead participants through series of stretches that are designed to increase flexibility thus improving mobility.   These stretches are additional exercise for major muscle groups that are typically performed during regular warm  up and cool down.   Hands Only CPR Anytime:  -Group instruction provided by verbal instruction, video, patient participation and written materials to support subject.  Instructors co-teach with AHA video for hands only CPR.  Participants get hands on experience with mannequins.   Nutrition I class: Heart Healthy Eating:  -Group instruction provided by PowerPoint slides, verbal discussion, and written materials to support subject matter. The instructor gives an explanation and review of the Therapeutic Lifestyle Changes diet recommendations, which includes a discussion on lipid goals, dietary fat, sodium, fiber, plant stanol/sterol esters, sugar, and the components of a well-balanced, healthy diet.   Nutrition II class: Lifestyle Skills:  -Group instruction provided by PowerPoint slides, verbal discussion, and written materials to support subject matter. The instructor gives an explanation and review of label reading, grocery shopping for heart health, heart healthy recipe modifications, and ways to make healthier choices when eating out.   Diabetes Question & Answer:  -Group instruction provided by PowerPoint slides, verbal discussion, and written materials to support subject matter. The instructor gives an explanation and review of diabetes co-morbidities, pre- and post-prandial blood glucose goals, pre-exercise blood glucose goals, signs, symptoms, and treatment of hypoglycemia and hyperglycemia, and foot care basics.   Diabetes Blitz:  -Group instruction provided by PowerPoint slides, verbal discussion, and written materials to support subject matter. The instructor gives an explanation and review of the physiology behind type 1 and type 2 diabetes, diabetes medications and rational behind using different medications, pre- and post-prandial blood glucose recommendations and  Hemoglobin A1c goals, diabetes diet, and exercise including blood glucose guidelines for exercising safely.    Portion Distortion:  -Group instruction provided by PowerPoint slides, verbal discussion, written materials, and food models to support subject matter. The instructor gives an explanation of serving size versus portion size, changes in portions sizes over the last 20 years, and what consists of a serving from each food group.   Stress Management:  -Group instruction provided by verbal instruction, video, and written materials to support subject matter.  Instructors review role of stress in heart disease and how to cope with stress positively.     Exercising on Your Own:  -Group instruction provided by verbal instruction, power point, and written materials to support subject.  Instructors discuss benefits of exercise, components of exercise, frequency and intensity of exercise, and end points for exercise.  Also discuss use of nitroglycerin and activating EMS.  Review options of places to exercise outside of rehab.  Review guidelines for sex with heart disease.   Cardiac Drugs I:  -Group instruction provided by verbal instruction and written materials to support subject.  Instructor reviews cardiac drug classes: antiplatelets, anticoagulants, beta blockers, and statins.  Instructor discusses reasons, side effects, and lifestyle considerations for each drug class.   Cardiac Drugs II:  -Group instruction provided by verbal instruction and written materials to support subject.  Instructor reviews cardiac drug classes: angiotensin converting enzyme inhibitors (ACE-I), angiotensin II receptor blockers (ARBs), nitrates, and calcium channel blockers.  Instructor discusses reasons, side effects, and lifestyle considerations for each drug class.   Anatomy and Physiology of the Circulatory System:  -Group instruction provided by verbal instruction, video, and written materials to support  subject.  Reviews functional anatomy of heart, how it relates to various diagnoses, and what role the heart plays in the overall system.   Knowledge Questionnaire Score:     Knowledge Questionnaire Score - 04/24/15 1548    Knowledge Questionnaire Score   Pre Score  18/24      Personal Goals and Risk Factors at Admission:     Personal Goals and Risk Factors at Admission - 04/24/15 1407    Core Components/Risk Factors/Patient Goals on Admission   Tobacco Cessation Yes  quit 2002   Hypertension Yes   Intervention Provide education on lifestyle modifcations including regular physical activity/exercise, weight management, moderate sodium restriction and increased consumption of fresh fruit, vegetables, and low fat dairy, alcohol moderation, and smoking cessation.;Monitor prescription use compliance.   Expected Outcomes Long Term: Maintenance of blood pressure at goal levels.;Short Term: Continued assessment and intervention until BP is < 140/53mm HG in hypertensive participants. < 130/33mm HG in hypertensive participants with diabetes, heart failure or chronic kidney disease.   Lipids Yes   Intervention Provide education and support for participant on nutrition & aerobic/resistive exercise along with prescribed medications to achieve LDL 70mg , HDL >40mg .   Expected Outcomes Short Term: Participant states understanding of desired cholesterol values and is compliant with medications prescribed. Participant is following exercise prescription and nutrition guidelines.;Long Term: Cholesterol controlled with medications as prescribed, with individualized exercise RX and with personalized nutrition plan. Value goals: LDL < 70mg , HDL > 40 mg.   Personal Goal Other Yes   Personal Goal Increase strength and stamina/endurance, back to lifting weights at home   Intervention Provide exercise prescription in program and at home   Expected Outcomes Increases in strength and functional capacity       Personal Goals and Risk Factors Review:    Personal Goals Discharge (Final Personal Goals and Risk Factors Review):    ITP Comments:     ITP Comments      04/24/15 1506           ITP Comments Dr. Fransico Him, MD Medical Director          Comments: Patient attended orientation from 1330 to 1530 to review rules and guidelines for program. Completed 6 minute walk test, Intitial ITP, and exercise prescription.  VSS. Telemetry-sinus rhythm, first degree AV block, IRBBB.  Asymptomatic.

## 2015-04-24 NOTE — Progress Notes (Signed)
Cardiac Rehab Medication Review by a Pharmacist  Does the patient  feel that his/her medications are working for him/her?  yes  Has the patient been experiencing any side effects to the medications prescribed?  no  Does the patient measure his/her own blood pressure or blood glucose at home?  yes   Does the patient have any problems obtaining medications due to transportation or finances?   no  Understanding of regimen: good Understanding of indications: good Potential of compliance: good    Pharmacist comments: 82 YO pleasant male presents for cardiac rehab.  Pt denies SE to his current regimen and has no problems obtaining his medications.  Pt denies s/sx bleeding on coumadin but is not currently taking ASA 81 mg. It was unclear in the cardiology notes if he was to restart ASA with Warfarin after his last cardioversion.  Discussed with patient and RN to contact cardiology to see if he should be taking ASA with his Warfarin based on his risk of bleeding.    Bennye Alm, PharmD Pharmacy Resident [phone]

## 2015-04-26 ENCOUNTER — Telehealth: Payer: Self-pay | Admitting: Cardiology

## 2015-04-26 NOTE — Telephone Encounter (Signed)
Please have nurse discuss orthostatic BP issues with primary Cardiollogist to see if meds need to be changed - I wrote this in rehab note under my attestation

## 2015-04-26 NOTE — Telephone Encounter (Signed)
Disregard not since about orthostatics

## 2015-05-02 ENCOUNTER — Encounter (HOSPITAL_COMMUNITY)
Admission: RE | Admit: 2015-05-02 | Discharge: 2015-05-02 | Disposition: A | Discharge status: Home / Self Care | Payer: Medicare Other | Source: Ambulatory Visit | Attending: Internal Medicine | Admitting: Internal Medicine

## 2015-05-02 ENCOUNTER — Ambulatory Visit (INDEPENDENT_AMBULATORY_CARE_PROVIDER_SITE_OTHER): Payer: Medicare Other | Admitting: *Deleted

## 2015-05-02 DIAGNOSIS — I4892 Unspecified atrial flutter: Secondary | ICD-10-CM | POA: Diagnosis not present

## 2015-05-02 DIAGNOSIS — I483 Typical atrial flutter: Secondary | ICD-10-CM | POA: Diagnosis not present

## 2015-05-02 DIAGNOSIS — Z951 Presence of aortocoronary bypass graft: Secondary | ICD-10-CM

## 2015-05-02 DIAGNOSIS — Z5181 Encounter for therapeutic drug level monitoring: Secondary | ICD-10-CM

## 2015-05-02 LAB — POCT INR: INR: 2.7

## 2015-05-02 NOTE — Progress Notes (Signed)
Cardiac Individual Treatment Plan  Patient Details  Name: Jonathan Orozco MRN: [id number] Date of Birth: 1928-06-30 Referring Provider:  Deboraha Sprang, MD  Initial Encounter Date:       CARDIAC REHAB PHASE II ORIENTATION from 04/24/2015 in [place]   Date  04/24/15      Visit Diagnosis: S/P CABG x 4  Patient's Home Medications on Admission:  Current outpatient prescriptions:  .  ALPRAZolam (XANAX) 0.5 MG tablet, Take 1 tablet by mouth daily as needed., Disp: , Rfl:  .  amiodarone (PACERONE) 200 MG tablet, Take 200 mg by mouth daily., Disp: , Rfl:  .  aspirin EC 81 MG EC tablet, Take 1 tablet (81 mg total) by mouth daily., Disp: , Rfl:  .  atorvastatin (LIPITOR) 10 MG tablet, Take 1 tablet (10 mg total) by mouth daily., Disp: 90 tablet, Rfl: 3 .  fish oil-omega-3 fatty acids 1000 MG capsule, Take 1 g by mouth daily. , Disp: , Rfl:  .  HYDROcodone-acetaminophen (NORCO/VICODIN) 5-325 MG tablet, Take 1 tablet by mouth every 6 (six) hours as needed for moderate pain (body pain)., Disp: , Rfl:  .  metoprolol (LOPRESSOR) 50 MG tablet, Take 1 tablet (50 mg total) by mouth 2 (two) times daily., Disp: 60 tablet, Rfl: 3 .  Multiple Vitamin (MULTIVITAMIN) tablet, Take 1 tablet by mouth daily.  , Disp: , Rfl:  .  Probiotic Product (RESTORA) CAPS, Take 1 capsule by mouth daily., Disp: 10 capsule, Rfl: 0 .  psyllium (METAMUCIL) 58.6 % packet, Take 1 packet by mouth daily., Disp: , Rfl:  .  warfarin (COUMADIN) 5 MG tablet, Take 5 mg by mouth daily. AS DIRECTED, Disp: , Rfl:   Past Medical History: Past Medical History  Diagnosis Date  . HYPERLIPIDEMIA 09/29/2006  . ERECTILE DYSFUNCTION 09/29/2006  . 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 ABUSE, ALCOHOL, IN REMISSION 04/08/2007  . Other specified forms of hearing loss 08/10/2009  . HYPERTENSION 09/29/2006  . VENTRICULAR TACHYCARDIA 09/29/2006  . ALLERGIC RHINITIS 09/29/2006  . BENIGN PROSTATIC HYPERTROPHY 09/29/2006   . OSTEOARTHROSIS NOS, OTHER [facility] SITE 09/29/2006  . BPPV (benign paroxysmal positional vertigo) 04/08/2007  . COLONIC POLYPS, HX OF 06/23/2007  . CAD (coronary artery disease)     a. LHC 2/17: EF 55-65%, LM 75, pLAD 75, oD1 75, oD2 65, D3 85, oLCx 99, oOM1 75, pRCA 25 >> CABG  . Carotid stenosis     a. Carotid US 2/17:  Bilateral ICA 1-39% ICA  . GLUCOSE INTOLERANCE 03/19/2010  . Atrial flutter ([place]) 03/28/2010    a. s/p prior rfca;  b. recurrent paroxysmal flutter 04/2012;  c. pradaxa initiated 04/2012.  [name] Long term (current) use of anticoagulants 04/26/2010  . Diverticulosis   . MELANOMA, MALIGNANT, SKIN NOS 09/29/2006    other skin cancers -no further melanoma    Tobacco Use: History  Smoking status  . Former Smoker  Smokeless tobacco  . Never Used    Comment: Quit smoking and drinking 20 years ago    Labs:     Recent Review [place] for ITP Cardiac and Pulmonary Rehab Latest Ref Rng 03/14/2015 03/14/2015 03/14/2015 03/14/2015 03/15/2015   PHART 7.350 - 7.450 7.475(H) 7.326(L) 7.362 - -   PCO2ART 35.0 - 45.0 mmHg 32.5(L) 44.0 40.3 - -   HCO3 20.0 - 24.0 mEq/L 24.3(H) 23.0 22.8 - -   TCO2 0 - 100 mmol/L 25 24 24 23  23  ACIDBASEDEF 0.0 - 2.0 mmol/L - 3.0(H) 2.0 - -   O2SAT - 93.0 97.0 96.0 - -      Capillary Blood Glucose: Lab Results  Component Value Date   GLUCAP 99 03/18/2015   GLUCAP 110* 03/17/2015   GLUCAP 96 03/17/2015   GLUCAP 112* 03/17/2015   GLUCAP 118* 03/17/2015     Exercise Target Goals:    Exercise Program Goal: Individual exercise prescription set with THRR, safety & activity barriers. Participant demonstrates ability to understand and report RPE using BORG scale, to self-measure pulse accurately, and to acknowledge the importance of the exercise prescription.  Exercise Prescription Goal: Starting with aerobic activity 30 plus minutes a day, 3 days per week for initial exercise prescription. Provide home exercise prescription and guidelines that  participant acknowledges understanding prior to discharge.  Activity Barriers & Risk Stratification:     Activity Barriers & Cardiac Risk Stratification - 04/24/15 1359    Activity Barriers & Cardiac Risk Stratification   Activity Barriers Back Problems;Deconditioning;Muscular Weakness;Right Knee Replacement;Balance Concerns;Assistive Device   Cardiac Risk Stratification High      6 Minute Walk:     6 Minute Walk      04/24/15 1506       6 Minute Walk   Phase Initial     Distance 1400 feet     Walk Time 6 minutes     # of Rest Breaks 0     MPH 2.65     METS 2.35     RPE 13     VO2 Peak 8.24     Symptoms No     Resting HR 64 bpm     Resting BP 132/70 mmHg     Max Ex. HR 81 bpm     Max Ex. BP 132/64 mmHg        Initial Exercise Prescription:     Initial Exercise Prescription - 04/24/15 1500    Date of Initial Exercise Prescription   Date 04/24/15   Bike   Level 0.5   Minutes 10   METs 2.3   NuStep   Level 2   Minutes 10   METs 2   Track   Laps 8   Minutes 10   METs 2.38   Prescription Details   Frequency (times per week) 3   Duration Progress to 30 minutes of continuous aerobic without signs/symptoms of physical distress   Intensity   THRR 40-80% of Max Heartrate 54-108   Ratings of Perceived Exertion 11-13   Progression   Progression Continue to progress workloads to maintain intensity without signs/symptoms of physical distress.   Resistance Training   Training Prescription Yes   Weight 2 lbs   Reps 10-12      Perform Capillary Blood Glucose checks as needed.  Exercise Prescription Changes:   Exercise Comments:   Discharge Exercise Prescription (Final Exercise Prescription Changes):   Nutrition:  Target Goals: Understanding of nutrition guidelines, daily intake of sodium 1500mg , cholesterol 200mg , calories 30% from fat and 7% or less from saturated fats, daily to have 5 or more servings of fruits and vegetables.  Biometrics:      Pre Biometrics - 04/24/15 1521    Pre Biometrics   Waist Circumference 36.75 inches   Hip Circumference 36 inches   Waist to Hip Ratio 1.02 %   Triceps Skinfold 10 mm   % Body Fat 23.4 %   Grip Strength 36 kg   Flexibility 11 in   Single Leg Stand  14.8 seconds       Nutrition Therapy Plan and Nutrition Goals:     Nutrition Therapy & Goals - 04/25/15 0840    Nutrition Therapy   Diet Therapeutic Lifestyle Changes   Drug/Food Interactions Coumadin/Vit K   Personal Nutrition Goals   Personal Goal #1 Maintain weight throughout Cardiac Rehab program   Intervention Plan   Intervention Prescribe, educate and counsel regarding individualized specific dietary modifications aiming towards targeted core components such as weight, hypertension, lipid management, diabetes, heart failure and other comorbidities.   Expected Outcomes Short Term Goal: Understand basic principles of dietary content, such as calories, fat, sodium, cholesterol and nutrients.;Long Term Goal: Adherence to prescribed nutrition plan.      Nutrition Discharge: Nutrition Scores:   Nutrition Goals Re-Evaluation:   Psychosocial: Target Goals: Acknowledge presence or absence of depression, maximize coping skills, provide positive support system. Participant is able to verbalize types and ability to use techniques and skills needed for reducing stress and depression.  Initial Review & Psychosocial Screening:     Initial Psych Review & Screening - 04/24/15 1530    Family Dynamics   Good Support System? Yes   Barriers   Psychosocial barriers to participate in program There are no identifiable barriers or psychosocial needs.   Screening Interventions   Interventions Encouraged to exercise      Quality of Life Scores:     Quality of Life - 04/24/15 1548    Quality of Life Scores   Health/Function Pre 23.64 %   Socioeconomic Pre 22.4 %   Psych/Spiritual Pre 22.5 %   Family Pre 23 %   GLOBAL Pre 23.13 %       PHQ-9:     Recent Review Flowsheet Data    Depression screen [facility] 2/9 04/24/2015 05/31/2014 05/24/2013   Decreased Interest 0 0 0   Down, Depressed, Hopeless 0 1 1   PHQ - 2 Score 0 1 1      Psychosocial Evaluation and Intervention:   Psychosocial Re-Evaluation:   Vocational Rehabilitation: Provide vocational rehab assistance to qualifying candidates.   Vocational Rehab Evaluation & Intervention:   Education: Education Goals: Education classes will be provided on a weekly basis, covering required topics. Participant will state understanding/return demonstration of topics presented.  Learning Barriers/Preferences:     Learning Barriers/Preferences - 04/24/15 1522    Learning Barriers/Preferences   Learning Barriers Hearing;Sight  reading glasses, hearing aids   Learning Preferences Written Material;Skilled Demonstration      Education Topics: Count Your Pulse:  -Group instruction provided by verbal instruction, demonstration, patient participation and written materials to support subject.  Instructors address importance of being able to find your pulse and how to count your pulse when at home without a heart monitor.  Patients get hands on experience counting their pulse with staff help and individually.   Heart Attack, Angina, and Risk Factor Modification:  -Group instruction provided by verbal instruction, video, and written materials to support subject.  Instructors address signs and symptoms of angina and heart attacks.    Also discuss risk factors for heart disease and how to make changes to improve heart health risk factors.   Functional Fitness:  -Group instruction provided by verbal instruction, demonstration, patient participation, and written materials to support subject.  Instructors address safety measures for doing things around the house.  Discuss how to get up and down off the floor, how to pick things up properly, how to safely get out of a chair  without  assistance, and balance training.   Meditation and Mindfulness:  -Group instruction provided by verbal instruction, patient participation, and written materials to support subject.  Instructor addresses importance of mindfulness and meditation practice to help reduce stress and improve awareness.  Instructor also leads participants through a meditation exercise.    Stretching for Flexibility and Mobility:  -Group instruction provided by verbal instruction, patient participation, and written materials to support subject.  Instructors lead participants through series of stretches that are designed to increase flexibility thus improving mobility.  These stretches are additional exercise for major muscle groups that are typically performed during regular warm up and cool down.   Hands Only CPR Anytime:  -Group instruction provided by verbal instruction, video, patient participation and written materials to support subject.  Instructors co-teach with AHA video for hands only CPR.  Participants get hands on experience with mannequins.   Nutrition I class: Heart Healthy Eating:  -Group instruction provided by PowerPoint slides, verbal discussion, and written materials to support subject matter. The instructor gives an explanation and review of the Therapeutic Lifestyle Changes diet recommendations, which includes a discussion on lipid goals, dietary fat, sodium, fiber, plant stanol/sterol esters, sugar, and the components of a well-balanced, healthy diet.   Nutrition II class: Lifestyle Skills:  -Group instruction provided by PowerPoint slides, verbal discussion, and written materials to support subject matter. The instructor gives an explanation and review of label reading, grocery shopping for heart health, heart healthy recipe modifications, and ways to make healthier choices when eating out.   Diabetes Question & Answer:  -Group instruction provided by PowerPoint slides, verbal  discussion, and written materials to support subject matter. The instructor gives an explanation and review of diabetes co-morbidities, pre- and post-prandial blood glucose goals, pre-exercise blood glucose goals, signs, symptoms, and treatment of hypoglycemia and hyperglycemia, and foot care basics.   Diabetes Blitz:  -Group instruction provided by PowerPoint slides, verbal discussion, and written materials to support subject matter. The instructor gives an explanation and review of the physiology behind type 1 and type 2 diabetes, diabetes medications and rational behind using different medications, pre- and post-prandial blood glucose recommendations and Hemoglobin A1c goals, diabetes diet, and exercise including blood glucose guidelines for exercising safely.    Portion Distortion:  -Group instruction provided by PowerPoint slides, verbal discussion, written materials, and food models to support subject matter. The instructor gives an explanation of serving size versus portion size, changes in portions sizes over the last 20 years, and what consists of a serving from each food group.   Stress Management:  -Group instruction provided by verbal instruction, video, and written materials to support subject matter.  Instructors review role of stress in heart disease and how to cope with stress positively.     Exercising on Your Own:  -Group instruction provided by verbal instruction, power point, and written materials to support subject.  Instructors discuss benefits of exercise, components of exercise, frequency and intensity of exercise, and end points for exercise.  Also discuss use of nitroglycerin and activating EMS.  Review options of places to exercise outside of rehab.  Review guidelines for sex with heart disease.   Cardiac Drugs I:  -Group instruction provided by verbal instruction and written materials to support subject.  Instructor reviews cardiac drug classes: antiplatelets,  anticoagulants, beta blockers, and statins.  Instructor discusses reasons, side effects, and lifestyle considerations for each drug class.   Cardiac Drugs II:  -Group instruction provided by verbal instruction and written materials to  support subject.  Instructor reviews cardiac drug classes: angiotensin converting enzyme inhibitors (ACE-I), angiotensin II receptor blockers (ARBs), nitrates, and calcium channel blockers.  Instructor discusses reasons, side effects, and lifestyle considerations for each drug class.   Anatomy and Physiology of the Circulatory System:  -Group instruction provided by verbal instruction, video, and written materials to support subject.  Reviews functional anatomy of heart, how it relates to various diagnoses, and what role the heart plays in the overall system.          CARDIAC REHAB PHASE II EXERCISE from 05/02/2015 in [place]   Date  05/02/15   Instruction Review Code  2- meets goals/outcomes      Knowledge Questionnaire Score:     Knowledge Questionnaire Score - 04/24/15 1548    Knowledge Questionnaire Score   Pre Score 18/24      Core Components/Risk Factors/Patient Goals at Admission:     Personal Goals and Risk Factors at Admission - 04/24/15 1407    Core Components/Risk Factors/Patient Goals on Admission   Tobacco Cessation Yes  quit 2002   Hypertension Yes   Intervention Provide education on lifestyle modifcations including regular physical activity/exercise, weight management, moderate sodium restriction and increased consumption of fresh fruit, vegetables, and low fat dairy, alcohol moderation, and smoking cessation.;Monitor prescription use compliance.   Expected Outcomes Long Term: Maintenance of blood pressure at goal levels.;Short Term: Continued assessment and intervention until BP is < 140/26mm HG in hypertensive participants. < 130/106mm HG in hypertensive participants with diabetes, heart failure or  chronic kidney disease.   Lipids Yes   Intervention Provide education and support for participant on nutrition & aerobic/resistive exercise along with prescribed medications to achieve LDL 70mg , HDL >40mg .   Expected Outcomes Short Term: Participant states understanding of desired cholesterol values and is compliant with medications prescribed. Participant is following exercise prescription and nutrition guidelines.;Long Term: Cholesterol controlled with medications as prescribed, with individualized exercise RX and with personalized nutrition plan. Value goals: LDL < 70mg , HDL > 40 mg.   Personal Goal Other Yes   Personal Goal Increase strength and stamina/endurance, back to lifting weights at home   Intervention Provide exercise prescription in program and at home   Expected Outcomes Increases in strength and functional capacity      Core Components/Risk Factors/Patient Goals Review:    Core Components/Risk Factors/Patient Goals at Discharge (Final Review):    ITP Comments:     ITP Comments      04/24/15 1506           ITP Comments Dr. Fransico Him, MD Medical Director          Comments: 14 DAY ITP REVIEW Pt is making expected progress toward personal goals after completing  1 session.   Recommend continued exercise and life style modification education including  stress management and relaxation techniques to decrease cardiac risk profile. Cherre Huger, BSN

## 2015-05-02 NOTE — Progress Notes (Signed)
Daily Session Note  Patient Details  Name: Jonathan Orozco MRN: [id number] Date of Birth: 04-Apr-1928 Referring Provider:  Deboraha Sprang, MD  Encounter Date: 05/02/2015  Check In:     Session Check In - 05/02/15 1155    Check-In   Location [facility]   Staff Present Maurice Small, RN, Deland Pretty, MS, ACSM CEP, Exercise Physiologist;Joann Rion, RN, BSN   Supervising physician immediately available to respond to emergencies [facility] immediately available   Physician(s) Dr. Marily Memos   Medication changes reported     No   Fall or balance concerns reported    No   Warm-up and Cool-down Performed as group-led instruction   Resistance Training Performed No   VAD Patient? No   Pain Assessment   Currently in Pain? No/denies      Capillary Blood Glucose: No results found for this or any previous visit (from the past 24 hour(s)).   Goals Met:  Personal goals reviewed  Goals Unmet:  Not Applicable  Comments: Pt started cardiac rehab today.  Pt tolerated light exercise without difficulty. VSS, telemetry-SR with first degree block.  This is present on pt most recent 12 lead ekg, asymptomatic.  Medication list reconciled. Pt denies barriers to medicaiton compliance.  PSYCHOSOCIAL ASSESSMENT:  PHQ-0. Pt exhibits positive coping skills, hopeful outlook with supportive family. No psychosocial needs identified at this time, no psychosocial interventions necessary.      Quality of Life - 04/24/15 1548    Quality of Life Scores   Health/Function Pre 23.64 %   Socioeconomic Pre 22.4 %   Psych/Spiritual Pre 22.5 %   Family Pre 23 %   GLOBAL Pre 23.13 %    Pt scored well above the low threshold of 21. Pt enjoys outside activities that involve being physical/active.  Pt enjoys going to the beach, gardening and walking.  Reviewed personal goals with pt.  Pt would like to increase strength and endurance as a short term goal.  Pt long term goal is get back to  lifting weights.  Will continue to monitor his progress toward meeting these goals.  Dr. Fransico Him is Medical Director for Cardiac Rehab at [facility].

## 2015-05-04 ENCOUNTER — Encounter (HOSPITAL_COMMUNITY)
Admission: RE | Admit: 2015-05-04 | Discharge: 2015-05-04 | Disposition: A | Discharge status: Home / Self Care | Payer: Medicare Other | Source: Ambulatory Visit | Attending: Internal Medicine | Admitting: Internal Medicine

## 2015-05-04 DIAGNOSIS — Z951 Presence of aortocoronary bypass graft: Secondary | ICD-10-CM

## 2015-05-04 NOTE — Progress Notes (Signed)
Daily Session Note  Patient Details  Name: Jonathan Orozco MRN: [id number] Date of Birth: [birth year] Referring Provider:  Deboraha Sprang, MD  Encounter Date: 05/04/2015  Check In:     Session Check In - 05/04/15 1130    Check-In   Location [facility]   Staff Present Maurice Small, RN, Levie Heritage, MA, ACSM RCEP, Exercise Physiologist;Olinty Niles, MS, ACSM CEP, Exercise Physiologist;Joann Rion, RN, BSN   Supervising physician immediately available to respond to emergencies [facility] immediately available   Physician(s) Dr. Sherral Hammers   Medication changes reported     No   Fall or balance concerns reported    No   Warm-up and Cool-down Performed as group-led instruction   Resistance Training Performed Yes   VAD Patient? No   Pain Assessment   Currently in Pain? No/denies   Multiple Pain Sites No      Capillary Blood Glucose: No results found for this or any previous visit (from the past 24 hour(s)).   Goals Met:  No report of cardiac concerns or symptoms  Goals Unmet:  Not Applicable  Comments:   QUALITY OF LIFE SCORE REVIEW  Pt completed Quality of Life survey as a participant in Cardiac Rehab. Scores 21.0 or below are considered low.Pt scored as follows      Quality of Life - 04/24/15 1548    Quality of Life Scores   Health/Function Pre 23.64 %   Socioeconomic Pre 22.4 %   Psych/Spiritual Pre 22.5 %   Family Pre 23 %   GLOBAL Pre 23.13 %        Pt scored well above the low threshold.  Pt demonstrated age approp mental status with some hearing deficits with supportive wife.  Pt wife brings him to exercise.  The plan is for him to be able to drive himself to exercise when he is able.  Pt wife cares for grand child who has autism.  Pt demonstrates positive and healthy coping skills. Pt maintains an active lifestyle which he contributes to his recovery from his heart surgery.  Will continue to monitor and intervene as necessary.   Maurice Small RN, BSN      Dr. Fransico Him is Medical Director for Cardiac Rehab at [facility].

## 2015-05-07 ENCOUNTER — Encounter (HOSPITAL_COMMUNITY)
Admission: RE | Admit: 2015-05-07 | Discharge: 2015-05-07 | Disposition: A | Discharge status: Home / Self Care | Payer: Medicare Other | Source: Ambulatory Visit | Attending: Internal Medicine | Admitting: Internal Medicine

## 2015-05-07 DIAGNOSIS — I4892 Unspecified atrial flutter: Secondary | ICD-10-CM | POA: Diagnosis not present

## 2015-05-07 DIAGNOSIS — F411 Generalized anxiety disorder: Secondary | ICD-10-CM | POA: Diagnosis not present

## 2015-05-07 DIAGNOSIS — Z951 Presence of aortocoronary bypass graft: Secondary | ICD-10-CM | POA: Insufficient documentation

## 2015-05-07 NOTE — Progress Notes (Addendum)
Jonathan Orozco 82 y.o. male Nutrition Note Spoke with pt.  Nutrition Survey reviewed with pt. Pt is following Step 2 of the Therapeutic Lifestyle Changes diet. Pt is pre-diabetic according to his last A1c. Pre-diabetes discussed. Age-appropriate nutrition education discussed. Pt expressed understanding of the information reviewed. Pt aware of nutrition education classes offered. Lab Results  Component Value Date   HGBA1C 5.7* 03/13/2015   Wt Readings from Last 3 Encounters:  04/24/15 166 lb 10.7 oz (75.6 kg)  04/17/15 164 lb 3.2 oz (74.481 kg)  04/02/15 170 lb 12.8 oz (77.474 kg)   Nutrition Diagnosis ? Food-and nutrition-related knowledge deficit related to lack of exposure to information as related to diagnosis of: ? CVD ? Pre-diabetes  Nutrition Intervention ? Benefits of adopting Therapeutic Lifestyle Changes discussed when Medficts reviewed. ? Pt to attend the Portion Distortion class ? Pt given handouts for: ? Nutrition I class ? Nutrition II class ? Continue client-centered nutrition education by RD, as part of interdisciplinary care.  Goal(s) ? Pt to describe the benefit of including fruits, vegetables, whole grains, and low-fat dairy products in a heart healthy meal plan.  Monitor and Evaluate progress toward nutrition goal with team.  Derek Mound, M.Ed, RD, LDN, CDE 05/07/2015 1:40 PM

## 2015-05-09 ENCOUNTER — Encounter (HOSPITAL_COMMUNITY)
Admission: RE | Admit: 2015-05-09 | Discharge: 2015-05-09 | Disposition: A | Discharge status: Home / Self Care | Payer: Medicare Other | Source: Ambulatory Visit | Attending: Internal Medicine | Admitting: Internal Medicine

## 2015-05-09 DIAGNOSIS — Z951 Presence of aortocoronary bypass graft: Secondary | ICD-10-CM | POA: Diagnosis not present

## 2015-05-11 ENCOUNTER — Encounter (HOSPITAL_COMMUNITY)
Admission: RE | Admit: 2015-05-11 | Discharge: 2015-05-11 | Disposition: A | Discharge status: Home / Self Care | Payer: Medicare Other | Source: Ambulatory Visit | Attending: Internal Medicine | Admitting: Internal Medicine

## 2015-05-11 DIAGNOSIS — Z951 Presence of aortocoronary bypass graft: Secondary | ICD-10-CM

## 2015-05-14 ENCOUNTER — Ambulatory Visit (INDEPENDENT_AMBULATORY_CARE_PROVIDER_SITE_OTHER): Payer: Medicare Other | Admitting: Pharmacist

## 2015-05-14 ENCOUNTER — Other Ambulatory Visit: Payer: Medicare Other

## 2015-05-14 ENCOUNTER — Encounter: Payer: Self-pay | Admitting: Internal Medicine

## 2015-05-14 ENCOUNTER — Ambulatory Visit (INDEPENDENT_AMBULATORY_CARE_PROVIDER_SITE_OTHER): Payer: Medicare Other | Admitting: Internal Medicine

## 2015-05-14 VITALS — BP 156/78 | HR 60 | Ht 70.0 in | Wt 168.8 lb

## 2015-05-14 DIAGNOSIS — R001 Bradycardia, unspecified: Secondary | ICD-10-CM

## 2015-05-14 DIAGNOSIS — I480 Paroxysmal atrial fibrillation: Secondary | ICD-10-CM

## 2015-05-14 DIAGNOSIS — I4892 Unspecified atrial flutter: Secondary | ICD-10-CM | POA: Diagnosis not present

## 2015-05-14 DIAGNOSIS — Z5181 Encounter for therapeutic drug level monitoring: Secondary | ICD-10-CM

## 2015-05-14 LAB — POCT INR: INR: 4.6

## 2015-05-14 MED ORDER — CEPHALEXIN 250 MG PO CAPS
ORAL_CAPSULE | ORAL | Status: DC
Start: 2015-05-14 — End: 2015-12-05

## 2015-05-14 NOTE — Progress Notes (Signed)
Patient Care Team: Biagio Borg, MD as PCP - General   HPI  Jonathan Orozco is a 82 y.o. male Seen in followup for outflow tract ectopy and atrial flutters for which  he underwent EP testing and 3-D mapping which failed to produce stable arrhythmias; medical therapy was undertaken.  He has been intolerant of amiodarone. 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 That has not been history of late.  He is been managed with rate control and anticoagulation with warfarin. Previously he had been on NOACs but was intolerant of Pradaxa because of bloating; he is now on warfarin.    10/16 he ended up with recurrent tachypalpitations and a [place] was found to be in atypical atrial flutter and was cardioverted  ER records labs and ECG were reviewed  Earlier this week he re-presented to the emergency room and tachycardia. He was cardioverted to sinus rhythm and discharged.   In January he presented with exertional chest discomfort and underwent catheterization.  1. LM lesion, 75% stenosed. 2. Ost Cx lesion, 99% stenosed. 3. Ost 1st Mrg to 1st Mrg lesion, 75% stenosed. 4. Prox LAD to Mid LAD lesion, 75% stenosed. 5. Ost 1st Diag lesion, 75% stenosed. 6. Ost 2nd Diag to 2nd Diag lesion, 65% stenosed.        3rd Diag lesion, 85% stenosed. LV function was normal. He underwent bypass surgery  He has had no intercurrent chest discomfort. His exercise tolerance is gradually improving.  He notes excoriation on his left shin. It is becoming increasingly erythematous over the last 48 hours.  Patient denies symptoms of GI intolerance, sun sensitivity, neurological symptoms attributable to amiodarone.  Surveillance laboratories were in normal limits when checked1years ago   Last echo 2010 normal LV function  Past Medical History  Diagnosis Date  . HYPERLIPIDEMIA 09/29/2006  . ERECTILE DYSFUNCTION 09/29/2006  . DISORDERS, ORGANIC  INSOMNIA NOS 09/29/2006  . ABUSE, ALCOHOL, IN REMISSION 04/08/2007  . Other specified forms of hearing loss 08/10/2009  . HYPERTENSION 09/29/2006  . VENTRICULAR TACHYCARDIA 09/29/2006  . ALLERGIC RHINITIS 09/29/2006  . BENIGN PROSTATIC HYPERTROPHY 09/29/2006  . OSTEOARTHROSIS NOS, OTHER [facility] SITE 09/29/2006  . BPPV (benign paroxysmal positional vertigo) 04/08/2007  . COLONIC POLYPS, HX OF 06/23/2007  . CAD (coronary artery disease)     a. LHC 2/17: EF 55-65%, LM 75, pLAD 75, oD1 75, oD2 65, D3 85, oLCx 99, oOM1 75, pRCA 25 >> CABG  . Carotid stenosis     a. Carotid US 2/17:  Bilateral ICA 1-39% ICA  . GLUCOSE INTOLERANCE 03/19/2010  . Atrial flutter ([place]) 03/28/2010    a. s/p prior rfca;  b. recurrent paroxysmal flutter 04/2012;  c. pradaxa initiated 04/2012.  [name] Long term (current) use of anticoagulants 04/26/2010  . Diverticulosis   . MELANOMA, MALIGNANT, SKIN NOS 09/29/2006    other skin cancers -no further melanoma    Past Surgical History  Procedure Laterality Date  . Left rotato cuff    . Joint replacement      Right knee  . Tonsillectomy    . Left knee arthroscopy    . Cataract extraction, bilateral Bilateral   . Colonoscopy with propofol N/A 12/08/2014    Procedure: COLONOSCOPY WITH PROPOFOL;  Surgeon: [name], MD;  Location: [facility];  Service: Endoscopy;  Laterality: N/A;  . Cardiac catheterization N/A 03/12/2015    Procedure: Left Heart Cath and Coronary Angiography;  Surgeon: Lynnell Dike  Tamala Julian, MD;  Location: [place] [facility];  Service: Cardiovascular;  Laterality: N/A;  . Coronary artery bypass graft N/A 03/14/2015    Procedure: CORONARY ARTERY BYPASS GRAFTING (CABG) x 4 (LIMA to LAD, SVG to DIAGONAL 2, SVG SEQUENTIALLY to OM1 and OM2);  Surgeon: [name], MD;  Location: [place];  Service: Open Heart Surgery;  Laterality: N/A;  . Clipping of atrial appendage N/A 03/14/2015    Procedure: CLIPPING OF ATRIAL APPENDAGE;  Surgeon: [name], MD;  Location: [place];   Service: Open Heart Surgery;  Laterality: N/A;  . Tee without cardioversion N/A 03/14/2015    Procedure: TRANSESOPHAGEAL ECHOCARDIOGRAM (TEE);  Surgeon: [name], MD;  Location: [place];  Service: Open Heart Surgery;  Laterality: N/A;    Current Outpatient Prescriptions  Medication Sig Dispense Refill  . ALPRAZolam (XANAX) 0.5 MG tablet Take 1 tablet by mouth daily as needed.    [name] amiodarone (PACERONE) 200 MG tablet Take 200 mg by mouth daily.    [name] aspirin EC 81 MG EC tablet Take 1 tablet (81 mg total) by mouth daily.    [name] atorvastatin (LIPITOR) 10 MG tablet Take 1 tablet (10 mg total) by mouth daily. 90 tablet 3  . fish oil-omega-3 fatty acids 1000 MG capsule Take 1 g by mouth daily.     [name] HYDROcodone-acetaminophen (NORCO/VICODIN) 5-325 MG tablet Take 1 tablet by mouth every 6 (six) hours as needed for moderate pain (body pain).    . metoprolol (LOPRESSOR) 50 MG tablet Take 1 tablet (50 mg total) by mouth 2 (two) times daily. 60 tablet 3  . Multiple Vitamin (MULTIVITAMIN) tablet Take 1 tablet by mouth daily.      . Probiotic Product (RESTORA) CAPS Take 1 capsule by mouth daily. 10 capsule 0  . psyllium (METAMUCIL) 58.6 % packet Take 1 packet by mouth daily.    . tamsulosin (FLOMAX) 0.4 MG CAPS capsule Take 0.4 mg by mouth daily.    [name] warfarin (COUMADIN) 5 MG tablet Take 5 mg by mouth daily. AS DIRECTED     No current facility-administered medications for this visit.    Allergies  Allergen Reactions  . Ace Inhibitors Cough    REACTION: cough  . Amiodarone Hcl     REACTION: intolerance Patient reports photosensitivity    Review of Systems negative except from HPI and PMH  Physical Exam BP 156/78 mmHg  Pulse 60  Ht 5\' 10"  (1.778 m)  Wt 168 lb 12.8 oz (76.567 kg)  BMI 24.22 kg/m2  SpO2 98% Well developed and nourished in no acute distress HENT normal Neck supple with JVP-flat Clear Regular rate and rhythm, no murmurs or gallops Abd-soft with active BS No Clubbing  cyanosis edema  Skin-warm and dry A & Oriented  Grossly normal sensory and motor function   ECG demonstrates sinus rhythm  at 62 Intervals 20/14/45 Axis XLVI    Assessment and  Plan  Atrial fibrillation/flutter paroxysmal   Sinus bradycardia  Hypertension  CAD s/p CABG    cELLULITIS  He continues to do well following his bypass surgery with increasing energy and exercise tolerance. He has been cleared for driving.  No significant arrhythmia. Blood pressures at home have been in the 120 range.  We will continue his current medications.  He has cellulitis around an excoriation on his shin. Given a 5 day course of antibiotics and has encouraged him to take some yogurt alongside  We will stop his amiodarone which was reinitiated following  his surgery

## 2015-05-14 NOTE — Patient Instructions (Addendum)
Medication Instructions: 1) Start Keflex 250 mg take once capsule by mouth every 8 hours x 5 days 2) Stop amiodarone  Labwork: - none  Procedures/Testing: - none  Follow-Up: - Your physician wants you to follow-up in: 6 months with Dr. [name]. You will receive a reminder letter in the mail two months in advance. If you don't receive a letter, please call our office to schedule the follow-up appointment.  Any Additional Special Instructions Will Be Listed Below (If Applicable).     If you need a refill on your cardiac medications before your next appointment, please call your pharmacy.

## 2015-05-16 ENCOUNTER — Encounter (HOSPITAL_COMMUNITY)
Admission: RE | Admit: 2015-05-16 | Discharge: 2015-05-16 | Disposition: A | Discharge status: Home / Self Care | Payer: Medicare Other | Source: Ambulatory Visit | Attending: Internal Medicine | Admitting: Internal Medicine

## 2015-05-16 DIAGNOSIS — Z951 Presence of aortocoronary bypass graft: Secondary | ICD-10-CM

## 2015-05-18 ENCOUNTER — Encounter (HOSPITAL_COMMUNITY)
Admission: RE | Admit: 2015-05-18 | Discharge: 2015-05-18 | Disposition: A | Discharge status: Home / Self Care | Payer: Medicare Other | Source: Ambulatory Visit | Attending: Internal Medicine | Admitting: Internal Medicine

## 2015-05-18 DIAGNOSIS — Z951 Presence of aortocoronary bypass graft: Secondary | ICD-10-CM | POA: Diagnosis not present

## 2015-05-18 NOTE — Progress Notes (Signed)
Reviewed home exercise with pt today.  Pt plans to walk and use treadmill and bike at home for exercise.  Reviewed THR, pulse, RPE, sign and symptoms, and when to call 911 or MD.  Also discussed weather considerations and indoor options.  Pt voiced understanding. Alberteen Sam, MA, ACSM RCEP

## 2015-05-21 ENCOUNTER — Encounter (HOSPITAL_COMMUNITY)
Admission: RE | Admit: 2015-05-21 | Discharge: 2015-05-21 | Disposition: A | Discharge status: Home / Self Care | Payer: Medicare Other | Source: Ambulatory Visit | Attending: Internal Medicine | Admitting: Internal Medicine

## 2015-05-21 DIAGNOSIS — Z951 Presence of aortocoronary bypass graft: Secondary | ICD-10-CM | POA: Diagnosis not present

## 2015-05-22 ENCOUNTER — Telehealth: Payer: Self-pay | Admitting: *Deleted

## 2015-05-22 MED ORDER — HYDROCODONE-ACETAMINOPHEN 5-325 MG PO TABS: 1.0000 | ORAL_TABLET | Freq: Four times a day (QID) | ORAL | Status: DC | PRN

## 2015-05-22 NOTE — Telephone Encounter (Signed)
Received call pt needing refill on his Hydrocodone...Jonathan Orozco

## 2015-05-22 NOTE — Telephone Encounter (Signed)
Done hardcopy to Corinne  

## 2015-05-22 NOTE — Telephone Encounter (Signed)
Patients medication has been placed up front, patient aware

## 2015-05-23 ENCOUNTER — Encounter (HOSPITAL_COMMUNITY)
Admission: RE | Admit: 2015-05-23 | Discharge: 2015-05-23 | Disposition: A | Discharge status: Home / Self Care | Payer: Medicare Other | Source: Ambulatory Visit | Attending: Internal Medicine | Admitting: Internal Medicine

## 2015-05-23 DIAGNOSIS — Z951 Presence of aortocoronary bypass graft: Secondary | ICD-10-CM | POA: Diagnosis not present

## 2015-05-25 ENCOUNTER — Encounter (HOSPITAL_COMMUNITY)
Admission: RE | Admit: 2015-05-25 | Discharge: 2015-05-25 | Disposition: A | Discharge status: Home / Self Care | Payer: Medicare Other | Source: Ambulatory Visit | Attending: Internal Medicine | Admitting: Internal Medicine

## 2015-05-25 DIAGNOSIS — Z951 Presence of aortocoronary bypass graft: Secondary | ICD-10-CM | POA: Diagnosis not present

## 2015-05-28 ENCOUNTER — Ambulatory Visit (INDEPENDENT_AMBULATORY_CARE_PROVIDER_SITE_OTHER): Payer: Medicare Other | Admitting: *Deleted

## 2015-05-28 ENCOUNTER — Encounter (HOSPITAL_COMMUNITY): Payer: Medicare Other

## 2015-05-28 DIAGNOSIS — Z5181 Encounter for therapeutic drug level monitoring: Secondary | ICD-10-CM | POA: Diagnosis not present

## 2015-05-28 DIAGNOSIS — I4892 Unspecified atrial flutter: Secondary | ICD-10-CM | POA: Diagnosis not present

## 2015-05-28 LAB — POCT INR: INR: 2.3

## 2015-05-28 NOTE — Progress Notes (Signed)
Cardiac Individual Treatment Plan  Patient Details  Name: Jonathan Orozco MRN: [id number] Date of Birth: 11-03-1928 Referring Provider:        CARDIAC REHAB PHASE II EXERCISE from 05/16/2015 in [place]   Referring Provider  Virl Axe MD      Initial Encounter Date:       CARDIAC REHAB PHASE II EXERCISE from 05/16/2015 in [place]   Date  04/24/15   Referring Provider  Virl Axe MD      Visit Diagnosis: No diagnosis found.  Patient's Home Medications on Admission:  Current outpatient prescriptions:  .  ALPRAZolam (XANAX) 0.5 MG tablet, Take 1 tablet by mouth daily as needed., Disp: , Rfl:  .  aspirin EC 81 MG EC tablet, Take 1 tablet (81 mg total) by mouth daily., Disp: , Rfl:  .  atorvastatin (LIPITOR) 10 MG tablet, Take 1 tablet (10 mg total) by mouth daily., Disp: 90 tablet, Rfl: 3 .  cephALEXin (KEFLEX) 250 MG capsule, Take one capsule (250 mg) by mouth every 8 hours x 5 days, Disp: 15 capsule, Rfl: 0 .  fish oil-omega-3 fatty acids 1000 MG capsule, Take 1 g by mouth daily. , Disp: , Rfl:  .  HYDROcodone-acetaminophen (NORCO/VICODIN) 5-325 MG tablet, Take 1 tablet by mouth every 6 (six) hours as needed for moderate pain (body pain)., Disp: 90 tablet, Rfl: 0 .  metoprolol (LOPRESSOR) 50 MG tablet, Take 1 tablet (50 mg total) by mouth 2 (two) times daily., Disp: 60 tablet, Rfl: 3 .  Multiple Vitamin (MULTIVITAMIN) tablet, Take 1 tablet by mouth daily.  , Disp: , Rfl:  .  Probiotic Product (RESTORA) CAPS, Take 1 capsule by mouth daily., Disp: 10 capsule, Rfl: 0 .  psyllium (METAMUCIL) 58.6 % packet, Take 1 packet by mouth daily., Disp: , Rfl:  .  tamsulosin (FLOMAX) 0.4 MG CAPS capsule, Take 0.4 mg by mouth daily., Disp: , Rfl:  .  warfarin (COUMADIN) 5 MG tablet, Take 5 mg by mouth daily. AS DIRECTED, Disp: , Rfl:   Past Medical History: Past Medical History  Diagnosis Date  . HYPERLIPIDEMIA 09/29/2006  .  ERECTILE DYSFUNCTION 09/29/2006  . 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 ABUSE, ALCOHOL, IN REMISSION 04/08/2007  . Other specified forms of hearing loss 08/10/2009  . HYPERTENSION 09/29/2006  . VENTRICULAR TACHYCARDIA 09/29/2006  . ALLERGIC RHINITIS 09/29/2006  . BENIGN PROSTATIC HYPERTROPHY 09/29/2006  . OSTEOARTHROSIS NOS, OTHER [facility] SITE 09/29/2006  . BPPV (benign paroxysmal positional vertigo) 04/08/2007  . COLONIC POLYPS, HX OF 06/23/2007  . CAD (coronary artery disease)     a. LHC 2/17: EF 55-65%, LM 75, pLAD 75, oD1 75, oD2 65, D3 85, oLCx 99, oOM1 75, pRCA 25 >> CABG  . Carotid stenosis     a. Carotid US 2/17:  Bilateral ICA 1-39% ICA  . GLUCOSE INTOLERANCE 03/19/2010  . Atrial flutter ([name]) 03/28/2010    a. s/p prior rfca;  b. recurrent paroxysmal flutter 04/2012;  c. pradaxa initiated 04/2012.  [name] Long term (current) use of anticoagulants 04/26/2010  . Diverticulosis   . MELANOMA, MALIGNANT, SKIN NOS 09/29/2006    other skin cancers -no further melanoma    Tobacco Use: History  Smoking status  . Former Smoker  Smokeless tobacco  . Never Used    Comment: Quit smoking and drinking 20 years ago    Labs:     Recent [place] for  ITP Cardiac and Pulmonary Rehab Latest Ref Rng 03/14/2015 03/14/2015 03/14/2015 03/14/2015 03/15/2015   PHART 7.350 - 7.450 7.475(H) 7.326(L) 7.362 - -   PCO2ART 35.0 - 45.0 mmHg 32.5(L) 44.0 40.3 - -   HCO3 20.0 - 24.0 mEq/L 24.3(H) 23.0 22.8 - -   TCO2 0 - 100 mmol/L 25 24 24 23 23    ACIDBASEDEF 0.0 - 2.0 mmol/L - 3.0(H) 2.0 - -   O2SAT - 93.0 97.0 96.0 - -      Capillary Blood Glucose: Lab Results  Component Value Date   GLUCAP 99 03/18/2015   GLUCAP 110* 03/17/2015   GLUCAP 96 03/17/2015   GLUCAP 112* 03/17/2015   GLUCAP 118* 03/17/2015     Exercise Target Goals:    Exercise Program Goal: Individual exercise prescription set with THRR, safety & activity barriers. Participant demonstrates ability to understand and report  RPE using BORG scale, to self-measure pulse accurately, and to acknowledge the importance of the exercise prescription.  Exercise Prescription Goal: Starting with aerobic activity 30 plus minutes a day, 3 days per week for initial exercise prescription. Provide home exercise prescription and guidelines that participant acknowledges understanding prior to discharge.  Activity Barriers & Risk Stratification:     Activity Barriers & Cardiac Risk Stratification - 04/24/15 1359    Activity Barriers & Cardiac Risk Stratification   Activity Barriers Back Problems;Deconditioning;Muscular Weakness;Right Knee Replacement;Balance Concerns;Assistive Device   Cardiac Risk Stratification High      6 Minute Walk:     6 Minute Walk      04/24/15 1506       6 Minute Walk   Phase Initial     Distance 1400 feet     Walk Time 6 minutes     # of Rest Breaks 0     MPH 2.65     METS 2.35     RPE 13     VO2 Peak 8.24     Symptoms No     Resting HR 64 bpm     Resting BP 132/70 mmHg     Max Ex. HR 81 bpm     Max Ex. BP 132/64 mmHg        Initial Exercise Prescription:     Initial Exercise Prescription - 05/17/15 1400    Date of Initial Exercise RX and Referring Provider   Date 04/24/15   Referring Provider Virl Axe MD   Bike   Level 0.7   Minutes 10   METs 2.73   NuStep   Level 2   Minutes 10   METs 1.7   Track   Laps 13   Minutes 10   METs 3.3   Resistance Training   Training Prescription Yes   Weight 2 lbs   Reps 10-12      Perform Capillary Blood Glucose checks as needed.  Exercise Prescription Changes:      Exercise Prescription Changes      05/03/15 1100 05/08/15 0800 05/18/15 1100 05/22/15 0900 05/29/15 1200   Exercise Review   Progression No  Pt started exercise on 05/02/15 Yes  Yes Yes   Response to Exercise   Blood Pressure (Admit) 130/80 mmHg 130/80 mmHg  138/70 mmHg 114/80 mmHg   Blood Pressure (Exercise) 168/80 mmHg 134/70 mmHg  140/80 mmHg 142/80  mmHg   Blood Pressure (Exit) 130/80 mmHg 134/70 mmHg  118/60 mmHg 112/68 mmHg   Heart Rate (Admit) 63 bpm 59 bpm  65 bpm 78 bpm   Heart Rate (Exercise)  83 bpm 79 bpm  105 bpm 112 bpm   Heart Rate (Exit) 62 bpm 59 bpm  661 bpm 94 bpm   Rating of Perceived Exertion (Exercise) 13 13  13 13    Symptoms none none  none none   Comments   Home Exercise Given 05/18/15 Home Exercise Given 05/18/15 Home Exercise Given 05/18/15   Duration Progress to 30 minutes of continuous aerobic without signs/symptoms of physical distress Progress to 30 minutes of continuous aerobic without signs/symptoms of physical distress  Progress to 30 minutes of continuous aerobic without signs/symptoms of physical distress Progress to 30 minutes of continuous aerobic without signs/symptoms of physical distress   Intensity THRR unchanged THRR unchanged  THRR unchanged THRR unchanged   Progression   Progression Continue to progress workloads to maintain intensity without signs/symptoms of physical distress. Continue to progress workloads to maintain intensity without signs/symptoms of physical distress.  Continue to progress workloads to maintain intensity without signs/symptoms of physical distress. Continue to progress workloads to maintain intensity without signs/symptoms of physical distress.   Average METs 2.5 2.6  2.4 3.5   Resistance Training   Training Prescription Yes Yes Yes Yes Yes   Weight 2 lbs 2 lbs 2 lbs 2 lbs 2 lbs   Reps 10-12 10-12 10-12 10-12 10-12   Interval Training   Interval Training No No  No No   Bike   Level 0.5 0.7 0.7 0.7 1.3   Minutes 10 10 10 10 10    METs 2.25 2.73 2.73 2.74 4.23   NuStep   Level 2 2 2 2 4    Minutes 10 10 10 10 10    METs 1.9 1.7 1.7 2.2 3   Track   Laps 13 13 13 8 13    Minutes 10 10 10 10 10    METs 3.3 3.3 3.3 2.38 3.3   Home Exercise Plan   Plans to continue exercise at   Home  home gym, treadmill, bike Home  home gym, treadmill, bike Home  home gym, treadmill, bike    Frequency   Add 3 additional days to program exercise sessions. Add 3 additional days to program exercise sessions. Add 3 additional days to program exercise sessions.      Exercise Comments:      Exercise Comments      05/03/15 1129 05/22/15 0959 05/29/15 1215       Exercise Comments Pt has recently started the program and is tolerating exercise well.  Will continue to monitor for exercise progression. Pt is tolerating exercise well and continues to make progress. Pt will continue to exercise at home using stationary bike, treadmill, and walking outside.        Discharge Exercise Prescription (Final Exercise Prescription Changes):     Exercise Prescription Changes - 05/29/15 1200    Exercise Review   Progression Yes   Response to Exercise   Blood Pressure (Admit) 114/80 mmHg   Blood Pressure (Exercise) 142/80 mmHg   Blood Pressure (Exit) 112/68 mmHg   Heart Rate (Admit) 78 bpm   Heart Rate (Exercise) 112 bpm   Heart Rate (Exit) 94 bpm   Rating of Perceived Exertion (Exercise) 13   Symptoms none   Comments Home Exercise Given 05/18/15   Duration Progress to 30 minutes of continuous aerobic without signs/symptoms of physical distress   Intensity THRR unchanged   Progression   Progression Continue to progress workloads to maintain intensity without signs/symptoms of physical distress.   Average METs 3.5   Resistance Training  Training Prescription Yes   Weight 2 lbs   Reps 10-12   Interval Training   Interval Training No   Bike   Level 1.3   Minutes 10   METs 4.23   NuStep   Level 4   Minutes 10   METs 3   Track   Laps 13   Minutes 10   METs 3.3   Home Exercise Plan   Plans to continue exercise at Home  home gym, treadmill, bike   Frequency Add 3 additional days to program exercise sessions.      Nutrition:  Target Goals: Understanding of nutrition guidelines, daily intake of sodium 1500mg , cholesterol 200mg , calories 30% from fat and 7% or less from  saturated fats, daily to have 5 or more servings of fruits and vegetables.  Biometrics:     Pre Biometrics - 04/24/15 1521    Pre Biometrics   Waist Circumference 36.75 inches   Hip Circumference 36 inches   Waist to Hip Ratio 1.02 %   Triceps Skinfold 10 mm   % Body Fat 23.4 %   Grip Strength 36 kg   Flexibility 11 in   Single Leg Stand 14.8 seconds       Nutrition Therapy Plan and Nutrition Goals:     Nutrition Therapy & Goals - 04/25/15 0840    Nutrition Therapy   Diet Therapeutic Lifestyle Changes   Drug/Food Interactions Coumadin/Vit K   Personal Nutrition Goals   Personal Goal #1 Maintain weight throughout Cardiac Rehab program   Intervention Plan   Intervention Prescribe, educate and counsel regarding individualized specific dietary modifications aiming towards targeted core components such as weight, hypertension, lipid management, diabetes, heart failure and other comorbidities.   Expected Outcomes Short Term Goal: Understand basic principles of dietary content, such as calories, fat, sodium, cholesterol and nutrients.;Long Term Goal: Adherence to prescribed nutrition plan.      Nutrition Discharge: Nutrition Scores:     Nutrition Assessments - 05/29/15 1547    MEDFICTS Scores   Post Score --  pt dropped from the program      Nutrition Goals Re-Evaluation:     Nutrition Goals Re-Evaluation      05/29/15 1546           Personal Goal #1 Re-Evaluation   Personal Goal #1 Maintain weight throughout Cardiac Rehab program       Goal Progress Seen --       Comments Pt maintained his wt while in the program. Discharged early.          Psychosocial: Target Goals: Acknowledge presence or absence of depression, maximize coping skills, provide positive support system. Participant is able to verbalize types and ability to use techniques and skills needed for reducing stress and depression.  Initial Review & Psychosocial Screening:     Initial Psych Review  & Screening - 05/04/15 [place]? Yes   Barriers   Psychosocial barriers to participate in program There are no identifiable barriers or psychosocial needs.      Quality of Life Scores:     Quality of Life - 04/24/15 1548    Quality of Life Scores   Health/Function Pre 23.64 %   Socioeconomic Pre 22.4 %   Psych/Spiritual Pre 22.5 %   Family Pre 23 %   GLOBAL Pre 23.13 %      PHQ-9:     Recent Review Flowsheet Data    Depression screen [facility] 2/9  04/24/2015 05/31/2014 05/24/2013   Decreased Interest 0 0 0   Down, Depressed, Hopeless 0 1 1   PHQ - 2 Score 0 1 1      Psychosocial Evaluation and Intervention:   Psychosocial Re-Evaluation:   Vocational Rehabilitation: Provide vocational rehab assistance to qualifying candidates.   Vocational Rehab Evaluation & Intervention:   Education: Education Goals: Education classes will be provided on a weekly basis, covering required topics. Participant will state understanding/return demonstration of topics presented.  Learning Barriers/Preferences:     Learning Barriers/Preferences - 04/24/15 1522    Learning Barriers/Preferences   Learning Barriers Hearing;Sight  reading glasses, hearing aids   Learning Preferences Written Material;Skilled Demonstration      Education Topics: Count Your Pulse:  -Group instruction provided by verbal instruction, demonstration, patient participation and written materials to support subject.  Instructors address importance of being able to find your pulse and how to count your pulse when at home without a heart monitor.  Patients get hands on experience counting their pulse with staff help and individually.      CARDIAC REHAB PHASE II EXERCISE from 05/25/2015 in [place]   Date  05/11/15   Instruction Review Code  2- meets goals/outcomes      Heart Attack, Angina, and Risk Factor Modification:  -Group instruction  provided by verbal instruction, video, and written materials to support subject.  Instructors address signs and symptoms of angina and heart attacks.    Also discuss risk factors for heart disease and how to make changes to improve heart health risk factors.   Functional Fitness:  -Group instruction provided by verbal instruction, demonstration, patient participation, and written materials to support subject.  Instructors address safety measures for doing things around the house.  Discuss how to get up and down off the floor, how to pick things up properly, how to safely get out of a chair without assistance, and balance training.      CARDIAC REHAB PHASE II EXERCISE from 05/25/2015 in [place]   Date  05/25/15   Instruction Review Code  2- meets goals/outcomes      Meditation and Mindfulness:  -Group instruction provided by verbal instruction, patient participation, and written materials to support subject.  Instructor addresses importance of mindfulness and meditation practice to help reduce stress and improve awareness.  Instructor also leads participants through a meditation exercise.    Stretching for Flexibility and Mobility:  -Group instruction provided by verbal instruction, patient participation, and written materials to support subject.  Instructors lead participants through series of stretches that are designed to increase flexibility thus improving mobility.  These stretches are additional exercise for major muscle groups that are typically performed during regular warm up and cool down.      CARDIAC REHAB PHASE II EXERCISE from 05/25/2015 in [place]   Date  05/18/15   Educator  Alberteen Sam, MA,ACSM RCEP   Instruction Review Code  2- meets goals/outcomes      Hands Only CPR Anytime:  -Group instruction provided by verbal instruction, video, patient participation and written materials to support subject.   Instructors co-teach with AHA video for hands only CPR.  Participants get hands on experience with mannequins.   Nutrition I class: Heart Healthy Eating:  -Group instruction provided by PowerPoint slides, verbal discussion, and written materials to support subject matter. The instructor gives an explanation and review of the Therapeutic Lifestyle Changes diet recommendations, which  includes a discussion on lipid goals, dietary fat, sodium, fiber, plant stanol/sterol esters, sugar, and the components of a well-balanced, healthy diet.      CARDIAC REHAB PHASE II EXERCISE from 05/25/2015 in [place]   Date  05/07/15   Educator  RD   Instruction Review Code  Not applicable [Class handout given]      Nutrition II class: Lifestyle Skills:  -Group instruction provided by PowerPoint slides, verbal discussion, and written materials to support subject matter. The instructor gives an explanation and review of label reading, grocery shopping for heart health, heart healthy recipe modifications, and ways to make healthier choices when eating out.      CARDIAC REHAB PHASE II EXERCISE from 05/25/2015 in [place]   Date  05/07/15   Educator  RD   Instruction Review Code  Not applicable [Class handouts given for pt to review]      Diabetes Question & Answer:  -Group instruction provided by PowerPoint slides, verbal discussion, and written materials to support subject matter. The instructor gives an explanation and review of diabetes co-morbidities, pre- and post-prandial blood glucose goals, pre-exercise blood glucose goals, signs, symptoms, and treatment of hypoglycemia and hyperglycemia, and foot care basics.   Diabetes Blitz:  -Group instruction provided by PowerPoint slides, verbal discussion, and written materials to support subject matter. The instructor gives an explanation and review of the physiology behind type 1 and type 2  diabetes, diabetes medications and rational behind using different medications, pre- and post-prandial blood glucose recommendations and Hemoglobin A1c goals, diabetes diet, and exercise including blood glucose guidelines for exercising safely.    Portion Distortion:  -Group instruction provided by PowerPoint slides, verbal discussion, written materials, and food models to support subject matter. The instructor gives an explanation of serving size versus portion size, changes in portions sizes over the last 20 years, and what consists of a serving from each food group.   Stress Management:  -Group instruction provided by verbal instruction, video, and written materials to support subject matter.  Instructors review role of stress in heart disease and how to cope with stress positively.        CARDIAC REHAB PHASE II EXERCISE from 05/25/2015 in [place]   Date  05/23/15   Instruction Review Code  2- meets goals/outcomes      Exercising on Your Own:  -Group instruction provided by verbal instruction, power point, and written materials to support subject.  Instructors discuss benefits of exercise, components of exercise, frequency and intensity of exercise, and end points for exercise.  Also discuss use of nitroglycerin and activating EMS.  Review options of places to exercise outside of rehab.  Review guidelines for sex with heart disease.      CARDIAC REHAB PHASE II EXERCISE from 05/25/2015 in [place]   Date  05/09/15   Educator  Alberteen Sam   Instruction Review Code  2- meets goals/outcomes      Cardiac Drugs I:  -Group instruction provided by verbal instruction and written materials to support subject.  Instructor reviews cardiac drug classes: antiplatelets, anticoagulants, beta blockers, and statins.  Instructor discusses reasons, side effects, and lifestyle considerations for each drug class.      CARDIAC REHAB PHASE  II EXERCISE from 05/25/2015 in [place]   Date  05/16/15   Educator  Pharm D   Instruction Review Code  2-  meets goals/outcomes      Cardiac Drugs II:  -Group instruction provided by verbal instruction and written materials to support subject.  Instructor reviews cardiac drug classes: angiotensin converting enzyme inhibitors (ACE-I), angiotensin II receptor blockers (ARBs), nitrates, and calcium channel blockers.  Instructor discusses reasons, side effects, and lifestyle considerations for each drug class.   Anatomy and Physiology of the Circulatory System:  -Group instruction provided by verbal instruction, video, and written materials to support subject.  Reviews functional anatomy of heart, how it relates to various diagnoses, and what role the heart plays in the overall system.          CARDIAC REHAB PHASE II EXERCISE from 05/25/2015 in [place]   Date  05/02/15   Instruction Review Code  2- meets goals/outcomes      Knowledge Questionnaire Score:     Knowledge Questionnaire Score - 04/24/15 1548    Knowledge Questionnaire Score   Pre Score 18/24      Core Components/Risk Factors/Patient Goals at Admission:     Personal Goals and Risk Factors at Admission - 04/24/15 1407    Core Components/Risk Factors/Patient Goals on Admission   Tobacco Cessation Yes  quit 2002   Hypertension Yes   Intervention Provide education on lifestyle modifcations including regular physical activity/exercise, weight management, moderate sodium restriction and increased consumption of fresh fruit, vegetables, and low fat dairy, alcohol moderation, and smoking cessation.;Monitor prescription use compliance.   Expected Outcomes Long Term: Maintenance of blood pressure at goal levels.;Short Term: Continued assessment and intervention until BP is < 140/52mm HG in hypertensive participants. < 130/50mm HG in hypertensive participants with  diabetes, heart failure or chronic kidney disease.   Lipids Yes   Intervention Provide education and support for participant on nutrition & aerobic/resistive exercise along with prescribed medications to achieve LDL 70mg , HDL >40mg .   Expected Outcomes Short Term: Participant states understanding of desired cholesterol values and is compliant with medications prescribed. Participant is following exercise prescription and nutrition guidelines.;Long Term: Cholesterol controlled with medications as prescribed, with individualized exercise RX and with personalized nutrition plan. Value goals: LDL < 70mg , HDL > 40 mg.   Personal Goal Other Yes   Personal Goal Increase strength and stamina/endurance, back to lifting weights at home   Intervention Provide exercise prescription in program and at home   Expected Outcomes Increases in strength and functional capacity      Core Components/Risk Factors/Patient Goals Review:      Goals and Risk Factor Review      05/09/15 1225 05/23/15 1157         Core Components/Risk Factors/Patient Goals Review   Personal Goals Review Increase Strength and Stamina;Other Sedentary;Increase Strength and Stamina;Other      Review Pt is exercising 3x week in program and gaining some strength.  He is still concerned about balance and is doing exercises at home to improve. Reviewed home ex on 4/17.  He has started walking more and gotten back to his home gym.  Strength and stamina are improving!.  Blood pressures have been steady.      Expected Outcomes Will continue to exericse to improve strength and stamina.  Will attend Functional Fitness class in April to get more insight into balance exercises. Continued improvement for strength and stamina and more exercise at home.         Core Components/Risk Factors/Patient Goals at Discharge (Final Review):      Goals and Risk Factor Review - 05/23/15  1157    Core Components/Risk Factors/Patient Goals Review   Personal  Goals Review Sedentary;Increase Strength and Stamina;Other   Review Reviewed home ex on 4/17.  He has started walking more and gotten back to his home gym.  Strength and stamina are improving!.  Blood pressures have been steady.   Expected Outcomes Continued improvement for strength and stamina and more exercise at home.      ITP Comments:     ITP Comments      04/24/15 1506           ITP Comments Dr. Fransico Him, MD Medical Director          Comments: Pt is making expected progress toward personal goals after completing 11 sessions. Recommend continued exercise and life style modification education including  stress management and relaxation techniques to decrease cardiac risk profile. Cherre Huger, BSN

## 2015-05-29 ENCOUNTER — Telehealth (HOSPITAL_COMMUNITY): Payer: Self-pay | Admitting: *Deleted

## 2015-05-30 ENCOUNTER — Encounter (HOSPITAL_COMMUNITY)
Admission: RE | Admit: 2015-05-30 | Discharge: 2015-05-30 | Disposition: A | Discharge status: Home / Self Care | Payer: Medicare Other | Source: Ambulatory Visit | Attending: Internal Medicine | Admitting: Internal Medicine

## 2015-05-30 ENCOUNTER — Telehealth (HOSPITAL_COMMUNITY): Payer: Self-pay | Admitting: *Deleted

## 2015-05-30 NOTE — Telephone Encounter (Signed)
Message left for pt in regards to returning parking badge access card.  Pt advised a postage paid envelope will be mailed to pt to use for return.  Contact information for cardiac rehab provided. Cherre Huger, BSN

## 2015-05-31 ENCOUNTER — Other Ambulatory Visit: Payer: Self-pay | Admitting: *Deleted

## 2015-05-31 MED ORDER — TAMSULOSIN HCL 0.4 MG PO CAPS
0.4000 mg | ORAL_CAPSULE | Freq: Every day | ORAL | Status: DC
Start: 2015-05-31 — End: 2015-10-31

## 2015-05-31 NOTE — Telephone Encounter (Signed)
Left msg on triage stating [facility] is [facility]. Have CPX schedule in May, but will run out before appt...Johny Chess

## 2015-05-31 NOTE — Progress Notes (Signed)
Discharge Summary  Patient Details  Name: Jonathan Orozco MRN: [id number] Date of Birth: Dec 07, 1928 Referring Provider:        CARDIAC REHAB PHASE II EXERCISE from 05/16/2015 in [place]   Referring Provider  Virl Axe MD       Number of Visits: 11  Reason for Discharge: Pt elected to discharge early from cardiac rehab due to having equipment at home and preferring to exercise at home.  Pt wife brings him to cardiac rehab and she also cares for a mentally challenged family member.  Pt felt it would be best for him he to continue to exercise at home.  Smoking History:  History  Smoking status  . Former Smoker  Smokeless tobacco  . Never Used    Comment: Quit smoking and drinking 20 years ago    Diagnosis:  S/P CABG x 4  ADL UCSD:   Initial Exercise Prescription:     Initial Exercise Prescription - 05/17/15 1400    Date of Initial Exercise RX and Referring Provider   Date 04/24/15   Referring Provider Virl Axe MD   Bike   Level 0.7   Minutes 10   METs 2.73   NuStep   Level 2   Minutes 10   METs 1.7   Track   Laps 13   Minutes 10   METs 3.3   Resistance Training   Training Prescription Yes   Weight 2 lbs   Reps 10-12      Discharge Exercise Prescription (Final Exercise Prescription Changes):     Exercise Prescription Changes - 05/29/15 1200    Exercise Review   Progression Yes   Response to Exercise   Blood Pressure (Admit) 114/80 mmHg   Blood Pressure (Exercise) 142/80 mmHg   Blood Pressure (Exit) 112/68 mmHg   Heart Rate (Admit) 78 bpm   Heart Rate (Exercise) 112 bpm   Heart Rate (Exit) 94 bpm   Rating of Perceived Exertion (Exercise) 13   Symptoms none   Comments Home Exercise Given 05/18/15   Duration Progress to 30 minutes of continuous aerobic without signs/symptoms of physical distress   Intensity THRR unchanged   Progression   Progression Continue to progress workloads to maintain intensity without  signs/symptoms of physical distress.   Average METs 3.5   Resistance Training   Training Prescription Yes   Weight 2 lbs   Reps 10-12   Interval Training   Interval Training No   Bike   Level 1.3   Minutes 10   METs 4.23   NuStep   Level 4   Minutes 10   METs 3   Track   Laps 13   Minutes 10   METs 3.3   Home Exercise Plan   Plans to continue exercise at Home  home gym, treadmill, bike   Frequency Add 3 additional days to program exercise sessions.      Functional Capacity:     6 Minute Walk      04/24/15 1506       6 Minute Walk   Phase Initial     Distance 1400 feet     Walk Time 6 minutes     # of Rest Breaks 0     MPH 2.65     METS 2.35     RPE 13     VO2 Peak 8.24     Symptoms No     Resting HR 64 bpm  Resting BP 132/70 mmHg     Max Ex. HR 81 bpm     Max Ex. BP 132/64 mmHg        Psychological, QOL, Others - Outcomes: PHQ 2/9: Depression screen [facility] 2/9 04/24/2015 05/31/2014 05/24/2013  Decreased Interest 0 0 0  Down, Depressed, Hopeless 0 1 1  PHQ - 2 Score 0 1 1    Quality of Life:     Quality of Life - 04/24/15 1548    Quality of Life Scores   Health/Function Pre 23.64 %   Socioeconomic Pre 22.4 %   Psych/Spiritual Pre 22.5 %   Family Pre 23 %   GLOBAL Pre 23.13 %      Personal Goals: Goals established at orientation with interventions provided to work toward goal.     Personal Goals and Risk Factors at Admission - 04/24/15 1407    Core Components/Risk Factors/Patient Goals on Admission   Tobacco Cessation Yes  quit 2002   Hypertension Yes   Intervention Provide education on lifestyle modifcations including regular physical activity/exercise, weight management, moderate sodium restriction and increased consumption of fresh fruit, vegetables, and low fat dairy, alcohol moderation, and smoking cessation.;Monitor prescription use compliance.   Expected Outcomes Long Term: Maintenance of blood pressure at goal levels.;Short Term:  Continued assessment and intervention until BP is < 140/64mm HG in hypertensive participants. < 130/70mm HG in hypertensive participants with diabetes, heart failure or chronic kidney disease.   Lipids Yes   Intervention Provide education and support for participant on nutrition & aerobic/resistive exercise along with prescribed medications to achieve LDL 70mg , HDL >40mg .   Expected Outcomes Short Term: Participant states understanding of desired cholesterol values and is compliant with medications prescribed. Participant is following exercise prescription and nutrition guidelines.;Long Term: Cholesterol controlled with medications as prescribed, with individualized exercise RX and with personalized nutrition plan. Value goals: LDL < 70mg , HDL > 40 mg.   Personal Goal Other Yes   Personal Goal Increase strength and stamina/endurance, back to lifting weights at home   Intervention Provide exercise prescription in program and at home   Expected Outcomes Increases in strength and functional capacity       Personal Goals Discharge:     Goals and Risk Factor Review      05/09/15 1225 05/23/15 1157         Core Components/Risk Factors/Patient Goals Review   Personal Goals Review Increase Strength and Stamina;Other Sedentary;Increase Strength and Stamina;Other      Review Pt is exercising 3x week in program and gaining some strength.  He is still concerned about balance and is doing exercises at home to improve. Reviewed home ex on 4/17.  He has started walking more and gotten back to his home gym.  Strength and stamina are improving!.  Blood pressures have been steady.      Expected Outcomes Will continue to exericse to improve strength and stamina.  Will attend Functional Fitness class in April to get more insight into balance exercises. Continued improvement for strength and stamina and more exercise at home.         Nutrition & Weight - Outcomes:     Pre Biometrics - 04/24/15 1521    Pre  Biometrics   Waist Circumference 36.75 inches   Hip Circumference 36 inches   Waist to Hip Ratio 1.02 %   Triceps Skinfold 10 mm   % Body Fat 23.4 %   Grip Strength 36 kg   Flexibility 11 in  Single Leg Stand 14.8 seconds       Nutrition:     Nutrition Therapy & Goals - 04/25/15 0840    Nutrition Therapy   Diet Therapeutic Lifestyle Changes   Drug/Food Interactions Coumadin/Vit K   Personal Nutrition Goals   Personal Goal #1 Maintain weight throughout Cardiac Rehab program   Intervention Plan   Intervention Prescribe, educate and counsel regarding individualized specific dietary modifications aiming towards targeted core components such as weight, hypertension, lipid management, diabetes, heart failure and other comorbidities.   Expected Outcomes Short Term Goal: Understand basic principles of dietary content, such as calories, fat, sodium, cholesterol and nutrients.;Long Term Goal: Adherence to prescribed nutrition plan.      Nutrition Discharge:     Nutrition Assessments - 05/29/15 1547    MEDFICTS Scores   Post Score --  pt dropped from the program      Education Questionnaire Score:     Knowledge Questionnaire Score - 04/24/15 1548    Knowledge Questionnaire Score   Pre Score 18/24      Goals reviewed with patient; copy given to patient.  Cherre Huger, BSN

## 2015-06-01 ENCOUNTER — Encounter (HOSPITAL_COMMUNITY): Payer: Medicare Other

## 2015-06-04 ENCOUNTER — Encounter (HOSPITAL_COMMUNITY): Admission: RE | Admit: 2015-06-04 | Payer: Medicare Other | Source: Ambulatory Visit

## 2015-06-06 ENCOUNTER — Encounter (HOSPITAL_COMMUNITY): Admission: RE | Admit: 2015-06-06 | Payer: Medicare Other | Source: Ambulatory Visit

## 2015-06-08 ENCOUNTER — Encounter (HOSPITAL_COMMUNITY): Payer: Medicare Other

## 2015-06-11 ENCOUNTER — Encounter (HOSPITAL_COMMUNITY): Payer: Medicare Other

## 2015-06-13 ENCOUNTER — Encounter (HOSPITAL_COMMUNITY): Payer: Medicare Other

## 2015-06-14 ENCOUNTER — Ambulatory Visit (INDEPENDENT_AMBULATORY_CARE_PROVIDER_SITE_OTHER): Payer: Medicare Other | Admitting: Internal Medicine

## 2015-06-14 ENCOUNTER — Other Ambulatory Visit (INDEPENDENT_AMBULATORY_CARE_PROVIDER_SITE_OTHER): Payer: Medicare Other

## 2015-06-14 ENCOUNTER — Encounter: Payer: Self-pay | Admitting: Internal Medicine

## 2015-06-14 VITALS — BP 138/80 | HR 57 | Temp 98.4°F | Resp 20 | Wt 172.0 lb

## 2015-06-14 DIAGNOSIS — H9193 Unspecified hearing loss, bilateral: Secondary | ICD-10-CM | POA: Diagnosis not present

## 2015-06-14 DIAGNOSIS — M545 Low back pain: Secondary | ICD-10-CM | POA: Diagnosis not present

## 2015-06-14 DIAGNOSIS — E785 Hyperlipidemia, unspecified: Secondary | ICD-10-CM

## 2015-06-14 DIAGNOSIS — R6889 Other general symptoms and signs: Secondary | ICD-10-CM | POA: Diagnosis not present

## 2015-06-14 DIAGNOSIS — Z0000 Encounter for general adult medical examination without abnormal findings: Secondary | ICD-10-CM | POA: Diagnosis not present

## 2015-06-14 DIAGNOSIS — Z0001 Encounter for general adult medical examination with abnormal findings: Secondary | ICD-10-CM | POA: Diagnosis not present

## 2015-06-14 DIAGNOSIS — R739 Hyperglycemia, unspecified: Secondary | ICD-10-CM

## 2015-06-14 DIAGNOSIS — G8929 Other chronic pain: Secondary | ICD-10-CM

## 2015-06-14 LAB — CBC WITH DIFFERENTIAL/PLATELET
BASOS PCT: 0.4 % (ref 0.0–3.0)
Basophils Absolute: 0 10*3/uL (ref 0.0–0.1)
EOS ABS: 0.5 10*3/uL (ref 0.0–0.7)
Eosinophils Relative: 5.2 % — ABNORMAL HIGH (ref 0.0–5.0)
HEMATOCRIT: 43.2 % (ref 39.0–52.0)
Hemoglobin: 14.5 g/dL (ref 13.0–17.0)
LYMPHS PCT: 44.6 % (ref 12.0–46.0)
Lymphs Abs: 3.9 10*3/uL (ref 0.7–4.0)
MCHC: 33.5 g/dL (ref 30.0–36.0)
MCV: 92 fl (ref 78.0–100.0)
MONOS PCT: 9.2 % (ref 3.0–12.0)
Monocytes Absolute: 0.8 10*3/uL (ref 0.1–1.0)
NEUTROS ABS: 3.6 10*3/uL (ref 1.4–7.7)
Neutrophils Relative %: 40.6 % — ABNORMAL LOW (ref 43.0–77.0)
PLATELETS: 260 10*3/uL (ref 150.0–400.0)
RBC: 4.69 Mil/uL (ref 4.22–5.81)
RDW: 15.3 % (ref 11.5–15.5)
WBC: 8.8 10*3/uL (ref 4.0–10.5)

## 2015-06-14 LAB — BASIC METABOLIC PANEL
BUN: 13 mg/dL (ref 6–23)
CHLORIDE: 101 meq/L (ref 96–112)
CO2: 29 mEq/L (ref 19–32)
CREATININE: 0.9 mg/dL (ref 0.40–1.50)
Calcium: 9.6 mg/dL (ref 8.4–10.5)
GFR: 85.17 mL/min (ref >60.00)
Glucose, Bld: 100 mg/dL — ABNORMAL HIGH (ref 70–99)
Potassium: 5.2 mEq/L — ABNORMAL HIGH (ref 3.5–5.1)
SODIUM: 137 meq/L (ref 135–145)

## 2015-06-14 LAB — HEPATIC FUNCTION PANEL
ALT: 28 U/L (ref 0–53)
AST: 23 U/L (ref 0–37)
Albumin: 4.2 g/dL (ref 3.5–5.2)
Alkaline Phosphatase: 97 U/L (ref 39–117)
BILIRUBIN DIRECT: 0.1 mg/dL (ref 0.0–0.3)
BILIRUBIN TOTAL: 0.6 mg/dL (ref 0.2–1.2)
Total Protein: 7.1 g/dL (ref 6.0–8.3)

## 2015-06-14 LAB — URINALYSIS, ROUTINE W REFLEX MICROSCOPIC
Bilirubin Urine: NEGATIVE
Hgb urine dipstick: NEGATIVE
Ketones, ur: NEGATIVE
Leukocytes, UA: NEGATIVE
Nitrite: NEGATIVE
PH: 6 (ref 5.0–8.0)
RBC / HPF: NONE SEEN (ref 0–?)
Total Protein, Urine: NEGATIVE
Urine Glucose: NEGATIVE
Urobilinogen, UA: 0.2 (ref 0.0–1.0)
WBC UA: NONE SEEN (ref 0–?)

## 2015-06-14 LAB — LIPID PANEL
CHOL/HDL RATIO: 2
Cholesterol: 123 mg/dL (ref 0–200)
HDL: 50.5 mg/dL (ref >39.00)
LDL Cholesterol: 63 mg/dL (ref 0–99)
NONHDL: 72.55
Triglycerides: 47 mg/dL (ref 0.0–149.0)
VLDL: 9.4 mg/dL (ref 0.0–40.0)

## 2015-06-14 LAB — HEMOGLOBIN A1C: Hgb A1c MFr Bld: 5.9 % (ref 4.6–6.5)

## 2015-06-14 LAB — TSH: TSH: 0.6 u[IU]/mL (ref 0.35–4.50)

## 2015-06-14 NOTE — Patient Instructions (Signed)

## 2015-06-14 NOTE — Progress Notes (Signed)
Pre visit review using our clinic review tool, if applicable. No additional management support is needed unless otherwise documented below in the visit note. 

## 2015-06-14 NOTE — Progress Notes (Signed)
Subjective:    Patient ID: Jonathan Orozco, male    DOB: [birth year], 82 y.o.   MRN: [id number]  HPI  Here for wellness and f/u;  Overall doing ok;  Pt denies Chest pain, worsening SOB, DOE, wheezing, orthopnea, PND, worsening LE edema, palpitations, dizziness or syncope.  Pt denies neurological change such as new headache, facial or extremity weakness.  Pt denies polydipsia, polyuria, or low sugar symptoms. Pt states overall good compliance with treatment and medications, good tolerability, and has been trying to follow appropriate diet.  Pt denies worsening depressive symptoms, suicidal ideation or panic. No fever, night sweats, wt loss, loss of appetite, or other constitutional symptoms.  Pt states good ability with ADL's, has low fall risk, home safety reviewed and adequate, no other significant changes in hearing or vision, and only occasionally active with exercise. S/p CABG mar 2017. Does c/o increased balance difficulty postop but no falls, orthostasis.  No overt bleeding on coumadin.  Does also have 1 wk worsening bilat hearing loss but has cleaned ear canals of wax and no improved. No HA, dizziness, ear pain or d/c.    Pt denies polydipsia, polyuria   Pt states overall good compliance with meds, trying to follow lower cholesterol, diabetic diet, wt overall stable Wt Readings from Last 3 Encounters:  06/14/15 172 lb (78.019 kg)  05/14/15 168 lb 12.8 oz (76.567 kg)  04/24/15 166 lb 10.7 oz (75.6 kg)  Pt continues to have recurring LBP without change in severity, bowel or bladder change, fever, wt loss,  worsening LE pain/numbness/weakness, gait change or falls. Past Medical History  Diagnosis Date  . HYPERLIPIDEMIA 09/29/2006  . ERECTILE DYSFUNCTION 09/29/2006  . 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 ABUSE, ALCOHOL, IN REMISSION 04/08/2007  . Other specified forms of hearing loss 08/10/2009  . HYPERTENSION 09/29/2006  . VENTRICULAR TACHYCARDIA 09/29/2006  . ALLERGIC RHINITIS 09/29/2006  .  BENIGN PROSTATIC HYPERTROPHY 09/29/2006  . OSTEOARTHROSIS NOS, OTHER [facility] SITE 09/29/2006  . BPPV (benign paroxysmal positional vertigo) 04/08/2007  . COLONIC POLYPS, HX OF 06/23/2007  . CAD (coronary artery disease)     a. LHC 2/17: EF 55-65%, LM 75, pLAD 75, oD1 75, oD2 65, D3 85, oLCx 99, oOM1 75, pRCA 25 >> CABG  . Carotid stenosis     a. Carotid US 2/17:  Bilateral ICA 1-39% ICA  . GLUCOSE INTOLERANCE 03/19/2010  . Atrial flutter ([place]) 03/28/2010    a. s/p prior rfca;  b. recurrent paroxysmal flutter 04/2012;  c. pradaxa initiated 04/2012.  [name] Long term (current) use of anticoagulants 04/26/2010  . Diverticulosis   . MELANOMA, MALIGNANT, SKIN NOS 09/29/2006    other skin cancers -no further melanoma   Past Surgical History  Procedure Laterality Date  . Left rotato cuff    . Joint replacement      Right knee  . Tonsillectomy    . Left knee arthroscopy    . Cataract extraction, bilateral Bilateral   . Colonoscopy with propofol N/A 12/08/2014    Procedure: COLONOSCOPY WITH PROPOFOL;  Surgeon: [name], MD;  Location: [facility];  Service: Endoscopy;  Laterality: N/A;  . Cardiac catheterization N/A 03/12/2015    Procedure: Left Heart Cath and Coronary Angiography;  Surgeon: [name], MD;  Location: [place] CV LAB;  Service: Cardiovascular;  Laterality: N/A;  . Coronary artery bypass graft N/A 03/14/2015    Procedure: CORONARY ARTERY BYPASS GRAFTING (CABG) x 4 (LIMA to LAD, SVG to DIAGONAL 2, SVG SEQUENTIALLY  to OM1 and OM2);  Surgeon: [name], MD;  Location: [place];  Service: Open Heart Surgery;  Laterality: N/A;  . Clipping of atrial appendage N/A 03/14/2015    Procedure: CLIPPING OF ATRIAL APPENDAGE;  Surgeon: [name], MD;  Location: [place];  Service: Open Heart Surgery;  Laterality: N/A;  . Tee without cardioversion N/A 03/14/2015    Procedure: TRANSESOPHAGEAL ECHOCARDIOGRAM (TEE);  Surgeon: [name], MD;  Location: [place];  Service: Open Heart  Surgery;  Laterality: N/A;    reports that he has quit smoking. He has never used smokeless tobacco. He reports that he does not drink alcohol or use illicit drugs. family history includes Diabetes in his brother; Esophageal cancer in his father. There is no history of Colon cancer or Stomach cancer. Allergies  Allergen Reactions  . Ace Inhibitors Cough    REACTION: cough  . Amiodarone Hcl     REACTION: intolerance Patient reports photosensitivity   Current Outpatient Prescriptions on File Prior to Visit  Medication Sig Dispense Refill  . ALPRAZolam (XANAX) 0.5 MG tablet Take 1 tablet by mouth daily as needed.    [name] aspirin EC 81 MG EC tablet Take 1 tablet (81 mg total) by mouth daily.    . cephALEXin (KEFLEX) 250 MG capsule Take one capsule (250 mg) by mouth every 8 hours x 5 days 15 capsule 0  . fish oil-omega-3 fatty acids 1000 MG capsule Take 1 g by mouth daily.     [name] HYDROcodone-acetaminophen (NORCO/VICODIN) 5-325 MG tablet Take 1 tablet by mouth every 6 (six) hours as needed for moderate pain (body pain). 90 tablet 0  . metoprolol (LOPRESSOR) 50 MG tablet Take 1 tablet (50 mg total) by mouth 2 (two) times daily. 60 tablet 3  . Multiple Vitamin (MULTIVITAMIN) tablet Take 1 tablet by mouth daily.      . Probiotic Product (RESTORA) CAPS Take 1 capsule by mouth daily. 10 capsule 0  . psyllium (METAMUCIL) 58.6 % packet Take 1 packet by mouth daily.    . tamsulosin (FLOMAX) 0.4 MG CAPS capsule Take 1 capsule (0.4 mg total) by mouth daily. Keep my appt for future refills 90 capsule 0  . warfarin (COUMADIN) 5 MG tablet Take 5 mg by mouth daily. AS DIRECTED     No current facility-administered medications on file prior to visit.   Review of Systems Constitutional: Negative for increased diaphoresis, or other activity, appetite or siginficant weight change other than noted HENT: Negative for worsening hearing loss, ear pain, facial swelling, mouth sores and neck stiffness.   Eyes: Negative  for other worsening pain, redness or visual disturbance.  Respiratory: Negative for choking or stridor Cardiovascular: Negative for other chest pain and palpitations.  Gastrointestinal: Negative for worsening diarrhea, blood in stool, or abdominal distention Genitourinary: Negative for hematuria, flank pain or change in urine volume.  Musculoskeletal: Negative for myalgias or other joint complaints.  Skin: Negative for other color change and wound or drainage.  Neurological: Negative for syncope and numbness. other than noted Hematological: Negative for adenopathy. or other swelling Psychiatric/Behavioral: Negative for hallucinations, SI, self-injury, decreased concentration or other worsening agitation.      Objective:   Physical Exam BP 138/80 mmHg  Pulse 57  Temp(Src) 98.4 F (36.9 C) (Oral)  Resp 20  Wt 172 lb (78.019 kg)  SpO2 97% VS noted,  Constitutional: Pt is oriented to person, place, and time. Appears well-developed and well-nourished, in no significant distress Head: Normocephalic and atraumatic  Eyes: Conjunctivae and EOM are normal. Pupils are equal, round, and reactive to light Right Ear: External ear normal.  Left Ear: External ear normal Nose: Nose normal.  Mouth/Throat: Oropharynx is clear and moist  Neck: Normal range of motion. Neck supple. No JVD present. No tracheal deviation present or significant neck LA or mass Cardiovascular: Normal rate, regular rhythm, normal heart sounds and intact distal pulses.   Pulmonary/Chest: Effort normal and breath sounds without rales or wheezing  Abdominal: Soft. Bowel sounds are normal. NT. No HSM  Musculoskeletal: Normal range of motion. Exhibits no edema Lymphadenopathy: Has no cervical adenopathy.  Neurological: Pt is alert and oriented to person, place, and time. Pt has normal reflexes. No cranial nerve deficit. Motor grossly intact Skin: Skin is warm and dry. No rash noted or new ulcers Psychiatric:  Has normal mood  and affect. Behavior is normal.     Assessment & Plan:

## 2015-06-15 ENCOUNTER — Other Ambulatory Visit: Payer: Self-pay | Admitting: *Deleted

## 2015-06-15 ENCOUNTER — Encounter (HOSPITAL_COMMUNITY): Payer: Medicare Other

## 2015-06-15 MED ORDER — ATORVASTATIN CALCIUM 10 MG PO TABS: 10.0000 mg | ORAL_TABLET | Freq: Every day | ORAL | Status: DC

## 2015-06-15 NOTE — Telephone Encounter (Signed)
Received call pt wife states needing refill sent on Atorvastatin. Inform [facility]...Johny Chess

## 2015-06-17 DIAGNOSIS — R739 Hyperglycemia, unspecified: Secondary | ICD-10-CM | POA: Insufficient documentation

## 2015-06-17 NOTE — Assessment & Plan Note (Addendum)
Likely sensorineural loss, more than mild, pt declines ENT or audiology referral for now, will call if needs  In addition to the time spent performing CPE, I spent an additional 40 minutes face to face,in which greater than 50% of this time was spent in counseling and coordination of care for patient's acute illness as documented.

## 2015-06-17 NOTE — Assessment & Plan Note (Signed)

## 2015-06-17 NOTE — Assessment & Plan Note (Signed)
stable overall by history and exam, recent data reviewed with pt, and pt to continue medical treatment as before,  to f/u any worsening symptoms or concerns Lab Results  Component Value Date   HGBA1C 5.9 06/14/2015   

## 2015-06-17 NOTE — Assessment & Plan Note (Signed)
stable overall by history and exam, and pt to continue medical treatment as before,  to f/u any worsening symptoms or concerns 

## 2015-06-17 NOTE — Assessment & Plan Note (Signed)
stable overall by history and exam, recent data reviewed with pt, and pt to continue medical treatment as before,  to f/u any worsening symptoms or concerns Lab Results  Component Value Date   LDLCALC 63 06/14/2015

## 2015-06-18 ENCOUNTER — Encounter (HOSPITAL_COMMUNITY): Payer: Medicare Other

## 2015-06-20 ENCOUNTER — Ambulatory Visit (INDEPENDENT_AMBULATORY_CARE_PROVIDER_SITE_OTHER): Payer: Medicare Other | Admitting: *Deleted

## 2015-06-20 ENCOUNTER — Encounter (HOSPITAL_COMMUNITY): Payer: Medicare Other

## 2015-06-20 DIAGNOSIS — I4892 Unspecified atrial flutter: Secondary | ICD-10-CM

## 2015-06-20 DIAGNOSIS — Z5181 Encounter for therapeutic drug level monitoring: Secondary | ICD-10-CM | POA: Diagnosis not present

## 2015-06-20 LAB — POCT INR: INR: 1.6

## 2015-06-22 ENCOUNTER — Encounter (HOSPITAL_COMMUNITY): Payer: Medicare Other

## 2015-06-25 ENCOUNTER — Encounter (HOSPITAL_COMMUNITY): Payer: Medicare Other

## 2015-06-26 ENCOUNTER — Encounter: Payer: Self-pay | Admitting: Internal Medicine

## 2015-06-26 ENCOUNTER — Ambulatory Visit (INDEPENDENT_AMBULATORY_CARE_PROVIDER_SITE_OTHER): Payer: Medicare Other | Admitting: Internal Medicine

## 2015-06-26 VITALS — BP 118/70 | HR 65 | Temp 98.1°F | Resp 20 | Wt 172.0 lb

## 2015-06-26 DIAGNOSIS — R42 Dizziness and giddiness: Secondary | ICD-10-CM

## 2015-06-26 DIAGNOSIS — H9193 Unspecified hearing loss, bilateral: Secondary | ICD-10-CM | POA: Diagnosis not present

## 2015-06-26 DIAGNOSIS — F411 Generalized anxiety disorder: Secondary | ICD-10-CM

## 2015-06-26 NOTE — Progress Notes (Signed)
Subjective:    Patient ID: Jonathan Orozco, male    DOB: 1928-05-24, 82 y.o.   MRN: [id number]  HPI  Here to f/u with co bilat hearing loss sudden x 1 wk without vertigo, ear pain, tinnitus, HA, sinus congestion , ST or cough. Pt denies chest pain, increased sob or doe, wheezing, orthopnea, PND, increased LE swelling, palpitations, dizziness or syncope.  Pt denies new neurological symptoms such as new headache, or facial or extremity weakness or numbness  Denies worsening depressive symptoms, suicidal ideation, or panic; has ongoing anxiety, Past Medical History  Diagnosis Date  . HYPERLIPIDEMIA 09/29/2006  . ERECTILE DYSFUNCTION 09/29/2006  . 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 ABUSE, ALCOHOL, IN REMISSION 04/08/2007  . Other specified forms of hearing loss 08/10/2009  . HYPERTENSION 09/29/2006  . VENTRICULAR TACHYCARDIA 09/29/2006  . ALLERGIC RHINITIS 09/29/2006  . BENIGN PROSTATIC HYPERTROPHY 09/29/2006  . OSTEOARTHROSIS NOS, OTHER [facility] SITE 09/29/2006  . BPPV (benign paroxysmal positional vertigo) 04/08/2007  . COLONIC POLYPS, HX OF 06/23/2007  . CAD (coronary artery disease)     a. LHC 2/17: EF 55-65%, LM 75, pLAD 75, oD1 75, oD2 65, D3 85, oLCx 99, oOM1 75, pRCA 25 >> CABG  . Carotid stenosis     a. Carotid US 2/17:  Bilateral ICA 1-39% ICA  . GLUCOSE INTOLERANCE 03/19/2010  . Atrial flutter ([place]) 03/28/2010    a. s/p prior rfca;  b. recurrent paroxysmal flutter 04/2012;  c. pradaxa initiated 04/2012.  [name] Long term (current) use of anticoagulants 04/26/2010  . Diverticulosis   . MELANOMA, MALIGNANT, SKIN NOS 09/29/2006    other skin cancers -no further melanoma   Past Surgical History  Procedure Laterality Date  . Left rotato cuff    . Joint replacement      Right knee  . Tonsillectomy    . Left knee arthroscopy    . Cataract extraction, bilateral Bilateral   . Colonoscopy with propofol N/A 12/08/2014    Procedure: COLONOSCOPY WITH PROPOFOL;  Surgeon: [name], MD;  [facility];  Service: Endoscopy;  Laterality: N/A;  . Cardiac catheterization N/A 03/12/2015    Procedure: Left Heart Cath and Coronary Angiography;  Surgeon: [name], MD;  Location: [place] CV LAB;  Service: Cardiovascular;  Laterality: N/A;  . Coronary artery bypass graft N/A 03/14/2015    Procedure: CORONARY ARTERY BYPASS GRAFTING (CABG) x 4 (LIMA to LAD, SVG to DIAGONAL 2, SVG SEQUENTIALLY to OM1 and OM2);  Surgeon: [name], MD;  Location: [place];  Service: Open Heart Surgery;  Laterality: N/A;  . Clipping of atrial appendage N/A 03/14/2015    Procedure: CLIPPING OF ATRIAL APPENDAGE;  Surgeon: [name], MD;  Location: [place];  Service: Open Heart Surgery;  Laterality: N/A;  . Tee without cardioversion N/A 03/14/2015    Procedure: TRANSESOPHAGEAL ECHOCARDIOGRAM (TEE);  Surgeon: [name], MD;  Location: [place];  Service: Open Heart Surgery;  Laterality: N/A;    reports that he has quit smoking. He has never used smokeless tobacco. He reports that he does not drink alcohol or use illicit drugs. family history includes Diabetes in his brother; Esophageal cancer in his father. There is no history of Colon cancer or Stomach cancer. Allergies  Allergen Reactions  . Ace Inhibitors Cough    REACTION: cough  . Amiodarone Hcl     REACTION: intolerance Patient reports photosensitivity   Current Outpatient Prescriptions on File Prior to Visit  Medication Sig  Dispense Refill  . ALPRAZolam (XANAX) 0.5 MG tablet Take 1 tablet by mouth daily as needed.    [name] aspirin EC 81 MG EC tablet Take 1 tablet (81 mg total) by mouth daily.    [name] atorvastatin (LIPITOR) 10 MG tablet Take 1 tablet (10 mg total) by mouth daily. 90 tablet 3  . cephALEXin (KEFLEX) 250 MG capsule Take one capsule (250 mg) by mouth every 8 hours x 5 days 15 capsule 0  . fish oil-omega-3 fatty acids 1000 MG capsule Take 1 g by mouth daily.     [name] HYDROcodone-acetaminophen (NORCO/VICODIN) 5-325 MG tablet Take  1 tablet by mouth every 6 (six) hours as needed for moderate pain (body pain). 90 tablet 0  . metoprolol (LOPRESSOR) 50 MG tablet Take 1 tablet (50 mg total) by mouth 2 (two) times daily. 60 tablet 3  . Multiple Vitamin (MULTIVITAMIN) tablet Take 1 tablet by mouth daily.      . Probiotic Product (RESTORA) CAPS Take 1 capsule by mouth daily. 10 capsule 0  . psyllium (METAMUCIL) 58.6 % packet Take 1 packet by mouth daily.    . tamsulosin (FLOMAX) 0.4 MG CAPS capsule Take 1 capsule (0.4 mg total) by mouth daily. Keep my appt for future refills 90 capsule 0  . warfarin (COUMADIN) 5 MG tablet Take 5 mg by mouth daily. AS DIRECTED     No current facility-administered medications on file prior to visit.   Review of Systems  Constitutional: Negative for unusual diaphoresis or night sweats HENT: Negative for ear swelling or discharge Eyes: Negative for worsening visual haziness  Respiratory: Negative for choking and stridor.   Gastrointestinal: Negative for distension or worsening eructation Genitourinary: Negative for retention or change in urine volume.  Musculoskeletal: Negative for other MSK pain or swelling Skin: Negative for color change and worsening wound Neurological: Negative for tremors and numbness other than noted  Psychiatric/Behavioral: Negative for decreased concentration or agitation other than above       Objective:   Physical Exam BP 118/70 mmHg  Pulse 65  Temp(Src) 98.1 F (36.7 C) (Oral)  Resp 20  Wt 172 lb (78.019 kg)  SpO2 97% VS noted,  Constitutional: Pt appears in no apparent distress HENT: Head: NCAT.  Right Ear: External ear normal.  Left Ear: External ear normal.  Bilat TM's clear after bilat wax impactions irrigated and hearing improved Eyes: . Pupils are equal, round, and reactive to light. Conjunctivae and EOM are normal Neck: Normal range of motion. Neck supple.  Cardiovascular: Normal rate and regular rhythm.   Pulmonary/Chest: Effort normal and  breath sounds without rales or wheezing.  Neurological: Pt is alert. Not confused , motor grossly intact Skin: Skin is warm. No rash, no LE edema Psychiatric: Pt behavior is normal. No agitation. mild nervous    Assessment & Plan:

## 2015-06-26 NOTE — Progress Notes (Signed)
Pre visit review using our clinic review tool, if applicable. No additional management support is needed unless otherwise documented below in the visit note. 

## 2015-06-27 ENCOUNTER — Encounter (HOSPITAL_COMMUNITY): Payer: Medicare Other

## 2015-06-29 ENCOUNTER — Encounter (HOSPITAL_COMMUNITY): Payer: Medicare Other

## 2015-07-02 DIAGNOSIS — H9193 Unspecified hearing loss, bilateral: Secondary | ICD-10-CM | POA: Insufficient documentation

## 2015-07-02 NOTE — Assessment & Plan Note (Signed)
Improved after irrigation, no other tx needed today,  to f/u any worsening symptoms or concerns

## 2015-07-02 NOTE — Patient Instructions (Signed)
Your ears were irrigated of wax today  Please continue all other medications as before, and refills have been done if requested.  Please have the pharmacy call with any other refills you may need.  Please continue your efforts at being more active, low cholesterol diet, and weight control.  Please keep your appointments with your specialists as you may have planned

## 2015-07-02 NOTE — Assessment & Plan Note (Signed)
stable overall by history and exam, recent data reviewed with pt, and pt to continue medical treatment as before,  to f/u any worsening symptoms or concerns Lab Results  Component Value Date   WBC 8.8 06/14/2015   HGB 14.5 06/14/2015   HCT 43.2 06/14/2015   PLT 260.0 06/14/2015   GLUCOSE 100* 06/14/2015   CHOL 123 06/14/2015   TRIG 47.0 06/14/2015   HDL 50.50 06/14/2015   LDLDIRECT 159.2 09/29/2006   LDLCALC 63 06/14/2015   ALT 28 06/14/2015   AST 23 06/14/2015   NA 137 06/14/2015   K 5.2* 06/14/2015   CL 101 06/14/2015   CREATININE 0.90 06/14/2015   BUN 13 06/14/2015   CO2 29 06/14/2015   TSH 0.60 06/14/2015   PSA 1.96 03/16/2009   INR 1.6 06/20/2015   HGBA1C 5.9 06/14/2015

## 2015-07-02 NOTE — Assessment & Plan Note (Signed)
Chronic recurrent, stable now and asympt,  to f/u any worsening symptoms or concerns

## 2015-07-04 ENCOUNTER — Encounter (HOSPITAL_COMMUNITY): Payer: Medicare Other

## 2015-07-06 ENCOUNTER — Encounter (HOSPITAL_COMMUNITY): Payer: Medicare Other

## 2015-07-09 ENCOUNTER — Encounter (HOSPITAL_COMMUNITY): Payer: Medicare Other

## 2015-07-10 ENCOUNTER — Ambulatory Visit (INDEPENDENT_AMBULATORY_CARE_PROVIDER_SITE_OTHER): Payer: Medicare Other | Admitting: Pharmacist

## 2015-07-10 DIAGNOSIS — I4892 Unspecified atrial flutter: Secondary | ICD-10-CM

## 2015-07-10 DIAGNOSIS — Z5181 Encounter for therapeutic drug level monitoring: Secondary | ICD-10-CM | POA: Diagnosis not present

## 2015-07-10 LAB — POCT INR: INR: 1.7

## 2015-07-11 ENCOUNTER — Encounter (HOSPITAL_COMMUNITY): Payer: Medicare Other

## 2015-07-13 ENCOUNTER — Encounter (HOSPITAL_COMMUNITY): Payer: Medicare Other

## 2015-07-16 ENCOUNTER — Encounter (HOSPITAL_COMMUNITY): Payer: Medicare Other

## 2015-07-18 ENCOUNTER — Encounter (HOSPITAL_COMMUNITY): Payer: Medicare Other

## 2015-07-20 ENCOUNTER — Encounter (HOSPITAL_COMMUNITY): Payer: Medicare Other

## 2015-07-23 ENCOUNTER — Encounter (HOSPITAL_COMMUNITY): Payer: Medicare Other

## 2015-07-24 ENCOUNTER — Ambulatory Visit (INDEPENDENT_AMBULATORY_CARE_PROVIDER_SITE_OTHER): Payer: Medicare Other | Admitting: Pharmacist

## 2015-07-24 DIAGNOSIS — Z5181 Encounter for therapeutic drug level monitoring: Secondary | ICD-10-CM | POA: Diagnosis not present

## 2015-07-24 DIAGNOSIS — I4892 Unspecified atrial flutter: Secondary | ICD-10-CM

## 2015-07-24 LAB — POCT INR: INR: 1.6

## 2015-07-25 ENCOUNTER — Encounter (HOSPITAL_COMMUNITY): Payer: Medicare Other

## 2015-07-27 ENCOUNTER — Encounter (HOSPITAL_COMMUNITY): Payer: Medicare Other

## 2015-07-30 ENCOUNTER — Encounter (HOSPITAL_COMMUNITY): Payer: Medicare Other

## 2015-08-01 ENCOUNTER — Encounter (HOSPITAL_COMMUNITY): Payer: Medicare Other

## 2015-08-03 ENCOUNTER — Encounter (HOSPITAL_COMMUNITY): Admission: RE | Admit: 2015-08-03 | Payer: Medicare Other | Source: Ambulatory Visit

## 2015-08-03 DIAGNOSIS — Z951 Presence of aortocoronary bypass graft: Secondary | ICD-10-CM

## 2015-08-03 DIAGNOSIS — I4892 Unspecified atrial flutter: Secondary | ICD-10-CM

## 2015-08-03 DIAGNOSIS — F411 Generalized anxiety disorder: Secondary | ICD-10-CM | POA: Insufficient documentation

## 2015-08-09 ENCOUNTER — Ambulatory Visit (INDEPENDENT_AMBULATORY_CARE_PROVIDER_SITE_OTHER): Payer: Medicare Other | Admitting: Pharmacist

## 2015-08-09 DIAGNOSIS — I4892 Unspecified atrial flutter: Secondary | ICD-10-CM | POA: Diagnosis not present

## 2015-08-09 DIAGNOSIS — Z5181 Encounter for therapeutic drug level monitoring: Secondary | ICD-10-CM

## 2015-08-09 LAB — POCT INR: INR: 1.4

## 2015-08-10 ENCOUNTER — Other Ambulatory Visit: Payer: Self-pay | Admitting: Pharmacist

## 2015-08-10 MED ORDER — WARFARIN SODIUM 5 MG PO TABS: ORAL_TABLET | ORAL | Status: DC

## 2015-08-14 ENCOUNTER — Ambulatory Visit (INDEPENDENT_AMBULATORY_CARE_PROVIDER_SITE_OTHER): Payer: Medicare Other

## 2015-08-14 DIAGNOSIS — Z5181 Encounter for therapeutic drug level monitoring: Secondary | ICD-10-CM

## 2015-08-14 DIAGNOSIS — I4892 Unspecified atrial flutter: Secondary | ICD-10-CM

## 2015-08-14 LAB — POCT INR: INR: 2

## 2015-08-22 ENCOUNTER — Telehealth: Payer: Self-pay | Admitting: *Deleted

## 2015-08-22 MED ORDER — HYDROCODONE-ACETAMINOPHEN 5-325 MG PO TABS: 1.0000 | ORAL_TABLET | Freq: Four times a day (QID) | ORAL | Status: DC | PRN

## 2015-08-22 NOTE — Telephone Encounter (Signed)
Wife left msg on triage husband requesting rx for  Hydrocodone...Jonathan Orozco

## 2015-08-22 NOTE — Telephone Encounter (Signed)
Done hardcopy to Corinne  

## 2015-08-23 NOTE — Telephone Encounter (Signed)
Patient aware medication is up front for pick up 

## 2015-08-28 ENCOUNTER — Ambulatory Visit (INDEPENDENT_AMBULATORY_CARE_PROVIDER_SITE_OTHER): Payer: Medicare Other

## 2015-08-28 DIAGNOSIS — I4892 Unspecified atrial flutter: Secondary | ICD-10-CM

## 2015-08-28 DIAGNOSIS — Z5181 Encounter for therapeutic drug level monitoring: Secondary | ICD-10-CM | POA: Diagnosis not present

## 2015-08-28 LAB — POCT INR: INR: 2.2

## 2015-09-18 ENCOUNTER — Ambulatory Visit (INDEPENDENT_AMBULATORY_CARE_PROVIDER_SITE_OTHER): Payer: Medicare Other | Admitting: Pharmacist

## 2015-09-18 DIAGNOSIS — Z5181 Encounter for therapeutic drug level monitoring: Secondary | ICD-10-CM

## 2015-09-18 DIAGNOSIS — I4892 Unspecified atrial flutter: Secondary | ICD-10-CM | POA: Diagnosis not present

## 2015-09-18 LAB — POCT INR: INR: 2.2

## 2015-10-16 ENCOUNTER — Ambulatory Visit (INDEPENDENT_AMBULATORY_CARE_PROVIDER_SITE_OTHER): Payer: Medicare Other | Admitting: *Deleted

## 2015-10-16 DIAGNOSIS — I4892 Unspecified atrial flutter: Secondary | ICD-10-CM | POA: Diagnosis not present

## 2015-10-16 DIAGNOSIS — Z5181 Encounter for therapeutic drug level monitoring: Secondary | ICD-10-CM

## 2015-10-16 LAB — POCT INR: INR: 1.9

## 2015-10-16 MED ORDER — WARFARIN SODIUM 5 MG PO TABS: ORAL_TABLET | ORAL | 0 refills | Status: DC

## 2015-10-31 ENCOUNTER — Other Ambulatory Visit: Payer: Self-pay | Admitting: *Deleted

## 2015-10-31 MED ORDER — TAMSULOSIN HCL 0.4 MG PO CAPS: 0.4000 mg | ORAL_CAPSULE | Freq: Every day | ORAL | 1 refills | Status: DC

## 2015-10-31 NOTE — Telephone Encounter (Signed)
Wife left msg on triage stating husband is needing [facility]. [facility]...Johny Chess

## 2015-11-13 ENCOUNTER — Ambulatory Visit (INDEPENDENT_AMBULATORY_CARE_PROVIDER_SITE_OTHER): Payer: Medicare Other | Admitting: *Deleted

## 2015-11-13 DIAGNOSIS — Z5181 Encounter for therapeutic drug level monitoring: Secondary | ICD-10-CM | POA: Diagnosis not present

## 2015-11-13 LAB — POCT INR: INR: 2.4

## 2015-11-20 ENCOUNTER — Ambulatory Visit (INDEPENDENT_AMBULATORY_CARE_PROVIDER_SITE_OTHER): Payer: Medicare Other

## 2015-11-20 DIAGNOSIS — Z23 Encounter for immunization: Secondary | ICD-10-CM | POA: Diagnosis not present

## 2015-12-05 ENCOUNTER — Ambulatory Visit (INDEPENDENT_AMBULATORY_CARE_PROVIDER_SITE_OTHER): Payer: Medicare Other | Admitting: Pharmacist

## 2015-12-05 ENCOUNTER — Encounter: Payer: Self-pay | Admitting: Internal Medicine

## 2015-12-05 ENCOUNTER — Ambulatory Visit (INDEPENDENT_AMBULATORY_CARE_PROVIDER_SITE_OTHER): Payer: Medicare Other | Admitting: Internal Medicine

## 2015-12-05 VITALS — BP 142/78 | HR 87 | Ht 72.0 in | Wt 176.6 lb

## 2015-12-05 DIAGNOSIS — I4892 Unspecified atrial flutter: Secondary | ICD-10-CM

## 2015-12-05 DIAGNOSIS — I480 Paroxysmal atrial fibrillation: Secondary | ICD-10-CM | POA: Diagnosis not present

## 2015-12-05 DIAGNOSIS — Z5181 Encounter for therapeutic drug level monitoring: Secondary | ICD-10-CM

## 2015-12-05 LAB — POCT INR: INR: 1.9

## 2015-12-05 MED ORDER — METOPROLOL TARTRATE 50 MG PO TABS: 50.0000 mg | ORAL_TABLET | Freq: Two times a day (BID) | ORAL | 3 refills | Status: DC

## 2015-12-05 NOTE — Progress Notes (Signed)
Patient Care Team: Biagio Borg, MD as PCP - General   HPI  Jonathan Orozco is a 82 y.o. male Seen in followup for outflow tract ectopy and atrial flutters for which  he underwent EP testing and 3-D mapping which failed to produce stable arrhythmias; medical therapy was undertaken.  He has been intolerant of amiodarone. 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   He's had no significant interval arrhythmias  His atrial arrhythmias have been managed with rate control and anticoagulation with warfarin. Previously he had been on NOACs but was intolerant     2/17 he presented with exertional chest discomfort and underwent catheterization.  1. LM lesion, 75% stenosed. 2. Ost Cx lesion, 99% stenosed. 3. Ost 1st Mrg to 1st Mrg lesion, 75% stenosed. 4. Prox LAD to Mid LAD lesion, 75% stenosed. 5. Ost 1st Diag lesion, 75% stenosed. 6. Ost 2nd Diag to 2nd Diag lesion, 65% stenosed.        3rd Diag lesion, 85% stenosed. LV function was normal. He underwent bypass surgery  He has had no intercurrent chest discomfort.  He is walking 30 minutes a day. He recently changed the radiator in his truck.   5/17 LDL 63  Past Medical History:  Diagnosis Date  . ABUSE, ALCOHOL, IN REMISSION 04/08/2007  . ALLERGIC RHINITIS 09/29/2006  . Atrial flutter ([place]) 03/28/2010   a. s/p prior rfca;  b. recurrent paroxysmal flutter 04/2012;  c. pradaxa initiated 04/2012.  [name] BENIGN PROSTATIC HYPERTROPHY 09/29/2006  . BPPV (benign paroxysmal positional vertigo) 04/08/2007  . CAD (coronary artery disease)    a. LHC 2/17: EF 55-65%, LM 75, pLAD 75, oD1 75, oD2 65, D3 85, oLCx 99, oOM1 75, pRCA 25 >> CABG  . Carotid stenosis    a. Carotid US 2/17:  Bilateral ICA 1-39% ICA  . COLONIC POLYPS, HX OF 06/23/2007  . 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 Diverticulosis   . ERECTILE DYSFUNCTION 09/29/2006  . GLUCOSE INTOLERANCE 03/19/2010  . HYPERLIPIDEMIA  09/29/2006  . HYPERTENSION 09/29/2006  . Long term (current) use of anticoagulants 04/26/2010  . MELANOMA, MALIGNANT, SKIN NOS 09/29/2006   other skin cancers -no further melanoma  . OSTEOARTHROSIS NOS, OTHER [facility] SITE 09/29/2006  . Other specified forms of hearing loss 08/10/2009  . VENTRICULAR TACHYCARDIA 09/29/2006    Past Surgical History:  Procedure Laterality Date  . CARDIAC CATHETERIZATION N/A 03/12/2015   Procedure: Left Heart Cath and Coronary Angiography;  Surgeon: [name], MD;  Location: [place] [facility];  Service: Cardiovascular;  Laterality: N/A;  . CATARACT EXTRACTION, BILATERAL Bilateral   . CLIPPING OF ATRIAL APPENDAGE N/A 03/14/2015   Procedure: CLIPPING OF ATRIAL APPENDAGE;  Surgeon: [name], MD;  Location: [place];  Service: Open Heart Surgery;  Laterality: N/A;  . COLONOSCOPY WITH PROPOFOL N/A 12/08/2014   Procedure: COLONOSCOPY WITH PROPOFOL;  Surgeon: [name], MD;  Location: [facility];  Service: Endoscopy;  Laterality: N/A;  . CORONARY ARTERY BYPASS GRAFT N/A 03/14/2015   Procedure: CORONARY ARTERY BYPASS GRAFTING (CABG) x 4 (LIMA to LAD, SVG to DIAGONAL 2, SVG SEQUENTIALLY to OM1 and OM2);  Surgeon: [name], MD;  Location: [place];  Service: Open Heart Surgery;  Laterality: N/A;  . JOINT REPLACEMENT     Right knee  . Left knee arthroscopy    . Left rotato cuff    . TEE WITHOUT CARDIOVERSION N/A 03/14/2015   Procedure:  TRANSESOPHAGEAL ECHOCARDIOGRAM (TEE);  Surgeon: [name], MD;  Location: [place];  Service: Open Heart Surgery;  Laterality: N/A;  . TONSILLECTOMY      Current Outpatient Prescriptions  Medication Sig Dispense Refill  . atorvastatin (LIPITOR) 10 MG tablet Take 1 tablet (10 mg total) by mouth daily. 90 tablet 3  . fish oil-omega-3 fatty acids 1000 MG capsule Take 1 g by mouth daily.     [name] HYDROcodone-acetaminophen (NORCO/VICODIN) 5-325 MG tablet Take 1 tablet by mouth every 6 (six) hours as needed for moderate pain  (body pain). 90 tablet 0  . metoprolol (LOPRESSOR) 50 MG tablet Take 1 tablet (50 mg total) by mouth 2 (two) times daily. 60 tablet 3  . Multiple Vitamin (MULTIVITAMIN) tablet Take 1 tablet by mouth daily.      . Probiotic Product (RESTORA) CAPS Take 1 capsule by mouth daily. 10 capsule 0  . psyllium (METAMUCIL) 58.6 % packet Take 1 packet by mouth daily.    . tamsulosin (FLOMAX) 0.4 MG CAPS capsule Take 1 capsule (0.4 mg total) by mouth daily. 90 capsule 1  . warfarin (COUMADIN) 5 MG tablet Take as directed by Coumadin clinic. 100 tablet 0   No current facility-administered medications for this visit.     Allergies  Allergen Reactions  . Ace Inhibitors Cough    REACTION: cough  . Amiodarone Hcl     REACTION: intolerance Patient reports photosensitivity    Review of Systems negative except from HPI and PMH  Physical Exam BP (!) 142/78   Pulse 87   Ht 6' (1.829 m)   Wt 176 lb 9.6 oz (80.1 kg)   SpO2 98%   BMI 23.95 kg/m  Well developed and nourished in no acute distress HENT normal Neck supple with JVP-flat Clear Regular rate and rhythm, no murmurs or gallops Abd-soft with active BS No Clubbing cyanosis edema  Skin-warm and dry A & Oriented  Grossly normal sensory and motor function   ECG demonstrates sinus rhythm  at  78 Intervals 19/13/41 Right bundle branch block  Assessment and  Plan  Atrial fibrillation/flutter paroxysmal   Sinus bradycardia  Hypertension  CAD s/p CABG    Without symptoms of ischemia  No significant arrhythmia. Short bursts  Blood pressures at home have been in the 120 range.  We will continue his current medications.

## 2015-12-05 NOTE — Addendum Note (Signed)
Addended by: Bobby Rumpf C on: 12/05/2015 01:08 PM   Modules accepted: Orders

## 2015-12-05 NOTE — Patient Instructions (Signed)
Medication Instructions: Your physician recommends that you continue on your current medications as directed. Please refer to the Current Medication list given to you today.   Labwork: None Ordered  Procedures/Testing: None Ordered  Follow-Up: Your physician wants you to follow-up in: 1 Year with Dr. [name]. You will receive a reminder letter in the mail two months in advance. If you don't receive a letter, please call our office to schedule the follow-up appointment.   Any Additional Special Instructions Will Be Listed Below (If Applicable).     If you need a refill on your cardiac medications before your next appointment, please call your pharmacy.

## 2015-12-07 ENCOUNTER — Telehealth: Payer: Self-pay | Admitting: *Deleted

## 2015-12-07 MED ORDER — HYDROCODONE-ACETAMINOPHEN 5-325 MG PO TABS: 1.0000 | ORAL_TABLET | Freq: Four times a day (QID) | ORAL | 0 refills | Status: DC | PRN

## 2015-12-07 NOTE — Telephone Encounter (Signed)
Wife call requesting refill on Hydrocodone...Jonathan Orozco

## 2015-12-07 NOTE — Telephone Encounter (Signed)
Done hardcopy to Corinne  

## 2015-12-07 NOTE — Telephone Encounter (Signed)
Patient aware prescription ready for pick up

## 2015-12-18 ENCOUNTER — Inpatient Hospital Stay (HOSPITAL_COMMUNITY)
Admission: EM | Admit: 2015-12-18 | Discharge: 2015-12-20 | DRG: 309 | Disposition: A | Discharge status: Home / Self Care | Payer: Medicare Other | Attending: Cardiology | Admitting: Cardiology

## 2015-12-18 ENCOUNTER — Encounter (HOSPITAL_COMMUNITY): Payer: Self-pay

## 2015-12-18 ENCOUNTER — Telehealth: Payer: Self-pay | Admitting: Internal Medicine

## 2015-12-18 ENCOUNTER — Emergency Department (HOSPITAL_COMMUNITY): Payer: Medicare Other

## 2015-12-18 DIAGNOSIS — Z7901 Long term (current) use of anticoagulants: Secondary | ICD-10-CM | POA: Diagnosis not present

## 2015-12-18 DIAGNOSIS — Z85820 Personal history of malignant melanoma of skin: Secondary | ICD-10-CM

## 2015-12-18 DIAGNOSIS — D68318 Other hemorrhagic disorder due to intrinsic circulating anticoagulants, antibodies, or inhibitors: Secondary | ICD-10-CM | POA: Diagnosis present

## 2015-12-18 DIAGNOSIS — Z79899 Other long term (current) drug therapy: Secondary | ICD-10-CM

## 2015-12-18 DIAGNOSIS — I4891 Unspecified atrial fibrillation: Secondary | ICD-10-CM | POA: Diagnosis present

## 2015-12-18 DIAGNOSIS — I4892 Unspecified atrial flutter: Secondary | ICD-10-CM | POA: Diagnosis not present

## 2015-12-18 DIAGNOSIS — Z888 Allergy status to other drugs, medicaments and biological substances status: Secondary | ICD-10-CM | POA: Diagnosis not present

## 2015-12-18 DIAGNOSIS — E785 Hyperlipidemia, unspecified: Secondary | ICD-10-CM | POA: Diagnosis present

## 2015-12-18 DIAGNOSIS — I2510 Atherosclerotic heart disease of native coronary artery without angina pectoris: Secondary | ICD-10-CM | POA: Diagnosis present

## 2015-12-18 DIAGNOSIS — R000 Tachycardia, unspecified: Secondary | ICD-10-CM | POA: Diagnosis present

## 2015-12-18 DIAGNOSIS — R001 Bradycardia, unspecified: Secondary | ICD-10-CM | POA: Diagnosis present

## 2015-12-18 DIAGNOSIS — I340 Nonrheumatic mitral (valve) insufficiency: Secondary | ICD-10-CM | POA: Diagnosis not present

## 2015-12-18 DIAGNOSIS — I471 Supraventricular tachycardia: Secondary | ICD-10-CM | POA: Diagnosis not present

## 2015-12-18 DIAGNOSIS — Z86010 Personal history of colonic polyps: Secondary | ICD-10-CM

## 2015-12-18 DIAGNOSIS — I480 Paroxysmal atrial fibrillation: Principal | ICD-10-CM

## 2015-12-18 DIAGNOSIS — Z951 Presence of aortocoronary bypass graft: Secondary | ICD-10-CM | POA: Diagnosis not present

## 2015-12-18 DIAGNOSIS — N400 Enlarged prostate without lower urinary tract symptoms: Secondary | ICD-10-CM | POA: Diagnosis present

## 2015-12-18 DIAGNOSIS — I10 Essential (primary) hypertension: Secondary | ICD-10-CM | POA: Diagnosis present

## 2015-12-18 LAB — BASIC METABOLIC PANEL
Anion gap: 5 (ref 5–15)
BUN: 12 mg/dL (ref 6–20)
CALCIUM: 9.5 mg/dL (ref 8.9–10.3)
CO2: 28 mmol/L (ref 22–32)
CREATININE: 0.83 mg/dL (ref 0.61–1.24)
Chloride: 103 mmol/L (ref 101–111)
GFR calc Af Amer: >60 mL/min (ref >60)
GFR calc non Af Amer: >60 mL/min (ref >60)
GLUCOSE: 113 mg/dL — AB (ref 65–99)
Potassium: 4.9 mmol/L (ref 3.5–5.1)
Sodium: 136 mmol/L (ref 135–145)

## 2015-12-18 LAB — MAGNESIUM: Magnesium: 2.1 mg/dL (ref 1.7–2.4)

## 2015-12-18 LAB — CBC
HCT: 46.3 % (ref 39.0–52.0)
Hemoglobin: 15.5 g/dL (ref 13.0–17.0)
MCH: 32.1 pg (ref 26.0–34.0)
MCHC: 33.5 g/dL (ref 30.0–36.0)
MCV: 95.9 fL (ref 78.0–100.0)
PLATELETS: 224 10*3/uL (ref 150–400)
RBC: 4.83 MIL/uL (ref 4.22–5.81)
RDW: 12.8 % (ref 11.5–15.5)
WBC: 7.9 10*3/uL (ref 4.0–10.5)

## 2015-12-18 LAB — TSH: TSH: 1.156 u[IU]/mL (ref 0.350–4.500)

## 2015-12-18 LAB — PROTIME-INR
INR: 1.54
PROTHROMBIN TIME: 18.7 s — AB (ref 11.4–15.2)

## 2015-12-18 MED ORDER — DILTIAZEM HCL 100 MG IV SOLR
5.0000 mg/h | Freq: Once | INTRAVENOUS | Status: AC
  Administered 2015-12-18: 5 mg/h via INTRAVENOUS
  Filled 2015-12-18: qty 100

## 2015-12-18 MED ORDER — WARFARIN SODIUM 7.5 MG PO TABS
7.5000 mg | ORAL_TABLET | ORAL | Status: AC
  Administered 2015-12-18: 7.5 mg via ORAL
  Filled 2015-12-18: qty 1

## 2015-12-18 MED ORDER — ZOLPIDEM TARTRATE 5 MG PO TABS: 5.0000 mg | ORAL_TABLET | Freq: Every evening | ORAL | Status: DC | PRN

## 2015-12-18 MED ORDER — ATORVASTATIN CALCIUM 10 MG PO TABS
10.0000 mg | ORAL_TABLET | Freq: Every day | ORAL | Status: DC
  Administered 2015-12-19 – 2015-12-20 (×2): 10 mg via ORAL
  Filled 2015-12-18 (×2): qty 1

## 2015-12-18 MED ORDER — ACETAMINOPHEN 325 MG PO TABS: 650.0000 mg | ORAL_TABLET | ORAL | Status: DC | PRN

## 2015-12-18 MED ORDER — SODIUM CHLORIDE 0.9 % IV BOLUS (SEPSIS)
1000.0000 mL | Freq: Once | INTRAVENOUS | Status: AC
  Administered 2015-12-18: 1000 mL via INTRAVENOUS

## 2015-12-18 MED ORDER — SODIUM CHLORIDE 0.9% FLUSH: 3.0000 mL | INTRAVENOUS | Status: DC | PRN

## 2015-12-18 MED ORDER — SODIUM CHLORIDE 0.9% FLUSH
3.0000 mL | Freq: Two times a day (BID) | INTRAVENOUS | Status: DC
  Administered 2015-12-18 – 2015-12-19 (×2): 3 mL via INTRAVENOUS

## 2015-12-18 MED ORDER — WARFARIN - PHARMACIST DOSING INPATIENT: Freq: Every day | Status: DC

## 2015-12-18 MED ORDER — DILTIAZEM HCL 25 MG/5ML IV SOLN
10.0000 mg | Freq: Once | INTRAVENOUS | Status: AC
  Administered 2015-12-18: 10 mg via INTRAVENOUS

## 2015-12-18 MED ORDER — OMEGA-3-ACID ETHYL ESTERS 1 G PO CAPS
1.0000 g | ORAL_CAPSULE | Freq: Every day | ORAL | Status: DC
  Administered 2015-12-19 – 2015-12-20 (×2): 1 g via ORAL
  Filled 2015-12-18 (×3): qty 1

## 2015-12-18 MED ORDER — ONDANSETRON HCL 4 MG/2ML IJ SOLN
4.0000 mg | Freq: Four times a day (QID) | INTRAMUSCULAR | Status: DC | PRN
Start: 2015-12-18 — End: 2015-12-20

## 2015-12-18 MED ORDER — HEPARIN BOLUS VIA INFUSION
2000.0000 [IU] | Freq: Once | INTRAVENOUS | Status: AC
  Administered 2015-12-18: 2000 [IU] via INTRAVENOUS
  Filled 2015-12-18: qty 2000

## 2015-12-18 MED ORDER — NITROGLYCERIN 0.4 MG SL SUBL: 0.4000 mg | SUBLINGUAL_TABLET | SUBLINGUAL | Status: DC | PRN

## 2015-12-18 MED ORDER — METOPROLOL TARTRATE 25 MG PO TABS
25.0000 mg | ORAL_TABLET | Freq: Two times a day (BID) | ORAL | Status: DC
  Administered 2015-12-18 – 2015-12-19 (×2): 25 mg via ORAL
  Filled 2015-12-18 (×2): qty 1

## 2015-12-18 MED ORDER — HYDROCODONE-ACETAMINOPHEN 5-325 MG PO TABS: 1.0000 | ORAL_TABLET | Freq: Every day | ORAL | Status: DC | PRN

## 2015-12-18 MED ORDER — HEPARIN (PORCINE) IN NACL 100-0.45 UNIT/ML-% IJ SOLN
1100.0000 [IU]/h | INTRAMUSCULAR | Status: DC
  Administered 2015-12-18: 1100 [IU]/h via INTRAVENOUS
  Filled 2015-12-18 (×2): qty 250

## 2015-12-18 MED ORDER — ADULT MULTIVITAMIN W/MINERALS CH
1.0000 | ORAL_TABLET | Freq: Every day | ORAL | Status: DC
  Administered 2015-12-19 – 2015-12-20 (×2): 1 via ORAL
  Filled 2015-12-18 (×3): qty 1

## 2015-12-18 MED ORDER — ALPRAZOLAM 0.25 MG PO TABS: 0.2500 mg | ORAL_TABLET | Freq: Three times a day (TID) | ORAL | Status: DC | PRN

## 2015-12-18 MED ORDER — SODIUM CHLORIDE 0.9 % IV SOLN
250.0000 mL | INTRAVENOUS | Status: DC | PRN
  Administered 2015-12-19: 13:00:00 via INTRAVENOUS

## 2015-12-18 MED ORDER — ALPRAZOLAM 0.25 MG PO TABS: 0.2500 mg | ORAL_TABLET | Freq: Two times a day (BID) | ORAL | Status: DC | PRN

## 2015-12-18 MED ORDER — METOPROLOL TARTRATE 25 MG PO TABS
25.0000 mg | ORAL_TABLET | Freq: Once | ORAL | Status: AC
  Administered 2015-12-18: 25 mg via ORAL
  Filled 2015-12-18: qty 1

## 2015-12-18 MED ORDER — DILTIAZEM HCL 25 MG/5ML IV SOLN
20.0000 mg | Freq: Once | INTRAVENOUS | Status: AC
Start: 2015-12-18 — End: 2015-12-18
  Administered 2015-12-18: 20 mg via INTRAVENOUS
  Filled 2015-12-18: qty 5

## 2015-12-18 MED ORDER — DEXTROSE 5 % IV SOLN: 5.0000 mg/h | INTRAVENOUS | Status: DC

## 2015-12-18 NOTE — ED Notes (Signed)
Spoke with cards, no new orders just continue to monitor.

## 2015-12-18 NOTE — Consult Note (Signed)
CARDIOLOGY CONSULT NOTE   Patient ID: Jonathan Orozco MRN: [id number] DOB/AGE: 1928/02/26 82 y.o.  Admit date: 12/18/2015  Primary Physician   Jonathan Cower, MD Primary Cardiologist   Dr Jonathan Orozco 12/05/2015 Reason for Consultation   Atrial fib, RVR Requesting MD: Dr Jonathan Orozco  Jonathan Orozco is a 82 y.o. year old male with a history of outflow tract ectopy and atrial flutter (s/p EP study, med rx) s/p DCCV 11/2014 and 02/2015, R outflow tract VT, anticoag w/ coumadin (did not tolerate Pradaxa), CABG 03/2015 w/ LIMA-LAD, SVG-D2, SVG-OM1-OM2, EF nl. Remote ETOH abuse, HTN, HLD, BPPV, BPH.  05/14/2015, seen by Dr Jonathan Orozco and was doing well. Maint SR. Amiodarone d/c'd.   11/01, seen by Dr Jonathan Orozco and was doing well 11/01 INR 1.9  Jonathan Orozco has been doing very well, he is active and does not feel restricted in his activities. He has not had any recent palpitations.   He was at [employer], doing work around the house and felt like he did a little more than he should. He was tired when he went to bed on Sunday night. He woke up very early Monday morning with palpitations. He is very sure he was in atrial fib, RVR at that time. [place] drove back from the beach and he has been tired, but no chest pain or SOB. Maybe a little weak. He knew his HR was in the 140s, did not tell his wife till today.  Today, they called the office and came into the ER as requested. His HR was initially 150s, got started on IV cardizem. His HR is still elevated with the Cardizem up-titrated to 15 mg/hr. He slowed abruptly earlier, with HR in the 60s, but was still in ?flutter and was never in SR since admission.     Past Medical History:  Diagnosis Date  . ABUSE, ALCOHOL, IN REMISSION 04/08/2007  . ALLERGIC RHINITIS 09/29/2006  . Atrial flutter ([place]) 03/28/2010   a. s/p prior rfca;  b. recurrent paroxysmal flutter 04/2012;  c. pradaxa initiated 04/2012.  [name] BENIGN PROSTATIC HYPERTROPHY 09/29/2006  . BPPV (benign paroxysmal  positional vertigo) 04/08/2007  . CAD (coronary artery disease)    a. LHC 2/17: EF 55-65%, LM 75, pLAD 75, oD1 75, oD2 65, D3 85, oLCx 99, oOM1 75, pRCA 25 >> CABG  . Carotid stenosis    a. Carotid US 2/17:  Bilateral ICA 1-39% ICA  . COLONIC POLYPS, HX OF 06/23/2007  . 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 Diverticulosis   . ERECTILE DYSFUNCTION 09/29/2006  . GLUCOSE INTOLERANCE 03/19/2010  . HYPERLIPIDEMIA 09/29/2006  . HYPERTENSION 09/29/2006  . Long term (current) use of anticoagulants 04/26/2010  . MELANOMA, MALIGNANT, SKIN NOS 09/29/2006   other skin cancers -no further melanoma  . OSTEOARTHROSIS NOS, OTHER [facility] SITE 09/29/2006  . Other specified forms of hearing loss 08/10/2009  . VENTRICULAR TACHYCARDIA 09/29/2006    Past Surgical History:  Procedure Laterality Date  . CARDIAC CATHETERIZATION N/A 03/12/2015   Procedure: Left Heart Cath and Coronary Angiography;  Surgeon: Jonathan Crome, MD;  Location: [place] [facility];  Service: Cardiovascular;  Laterality: N/A;  . CATARACT EXTRACTION, BILATERAL Bilateral   . CLIPPING OF ATRIAL APPENDAGE N/A 03/14/2015   Procedure: CLIPPING OF ATRIAL APPENDAGE;  Surgeon: Jonathan Nakayama, MD;  Location: [place];  Service: Open Heart Surgery;  Laterality: N/A;  . COLONOSCOPY WITH PROPOFOL N/A 12/08/2014   Procedure: COLONOSCOPY WITH PROPOFOL;  Surgeon: Jonathan Ada,  MD;  [facility];  Service: Endoscopy;  Laterality: N/A;  . CORONARY ARTERY BYPASS GRAFT N/A 03/14/2015   Procedure: CORONARY ARTERY BYPASS GRAFTING (CABG) x 4 (LIMA to LAD, SVG to DIAGONAL 2, SVG SEQUENTIALLY to OM1 and OM2);  Surgeon: Jonathan Nakayama, MD;  Location: [place];  Service: Open Heart Surgery;  Laterality: N/A;  . JOINT REPLACEMENT     Right knee  . Left knee arthroscopy    . Left rotato cuff    . TEE WITHOUT CARDIOVERSION N/A 03/14/2015   Procedure: TRANSESOPHAGEAL ECHOCARDIOGRAM (TEE);  Surgeon: Jonathan Nakayama, MD;  Location: [place];  Service: Open Heart  Surgery;  Laterality: N/A;  . TONSILLECTOMY      Allergies  Allergen Reactions  . Amiodarone Hcl Other (See Comments)    REACTION: intolerance Patient reports photosensitivity  . Ace Inhibitors Cough    I have reviewed the patient's current medications Prior to Admission medications   Medication Sig Start Date End Date Taking? Authorizing Provider  atorvastatin (LIPITOR) 10 MG tablet Take 1 tablet (10 mg total) by mouth daily. 06/15/15  Yes Jonathan Borg, MD  fish oil-omega-3 fatty acids 1000 MG capsule Take 1 g by mouth daily.    Yes Historical Provider, MD  HYDROcodone-acetaminophen (NORCO/VICODIN) 5-325 MG tablet Take 1 tablet by mouth every 6 (six) hours as needed for moderate pain (body pain). 12/07/15  Yes Jonathan Borg, MD  metoprolol (LOPRESSOR) 50 MG tablet Take 1 tablet (50 mg total) by mouth 2 (two) times daily. 12/05/15   Jonathan Sprang, MD  Multiple Vitamin (MULTIVITAMIN) tablet Take 1 tablet by mouth daily.      Historical Provider, MD  Probiotic Product (RESTORA) CAPS Take 1 capsule by mouth daily. 12/08/12   Jonathan Shipper, MD  psyllium (METAMUCIL) 58.6 % packet Take 1 packet by mouth daily.    Historical Provider, MD  tamsulosin (FLOMAX) 0.4 MG CAPS capsule Take 1 capsule (0.4 mg total) by mouth daily. 10/31/15   Jonathan Borg, MD  warfarin (COUMADIN) 5 MG tablet Take as directed by Coumadin clinic. 10/16/15   Jonathan Sprang, MD     Social History   Social History  . Marital status: Married    Spouse name: N/A  . Number of children: N/A  . Years of education: N/A   Occupational History  . Retired, Press photographer    Social History Main Topics  . Smoking status: Former Research scientist (life sciences)  . Smokeless tobacco: Never Used     Comment: Quit smoking and drinking 20 years ago  . Alcohol use No  . Drug use: No  . Sexual activity: Not on file   Other Topics Concern  . Not on file   Social History Narrative   Married, 3 children. Retired Press photographer. Lives in [place] with  wife and kids. He is retired  from Press photographer.     Family Status  Relation Status  . Brother Alive   Tachyarrythmia  . Mother Alive   CHF  . Father Alive   Esophageal cancer  . Sister Alive   hx arrhythmias  . Brother Alive   Dialysis/bright disease  . Neg Hx    Family History  Problem Relation Age of Onset  . Diabetes Brother   . Esophageal cancer Father   . Colon cancer Neg Hx   . Stomach cancer Neg Hx      ROS:  Full 14 point review of systems complete and found to be negative unless listed above.  Physical Exam: Blood pressure 113/77, pulse (!) 46, temperature 98 F (36.7 C), temperature source Oral, resp. rate 17, SpO2 98 %.  General: Well developed, well nourished, male in no acute distress Head: Eyes PERRLA, No xanthomas.   Normocephalic and atraumatic, oropharynx without edema or exudate. Dentition: poor Lungs: few scattered rales, decreased BS bases Heart: Heart irregular rate and rhythm with S1, S2, no murmur. pulses are 2+ all 4 extrem.   Neck: No carotid bruits. No lymphadenopathy.  JVD not elevated Abdomen: Bowel sounds present, abdomen soft and non-tender without masses or hernias noted. Msk:  No spine or cva tenderness. No weakness, no joint deformities or effusions. Extremities: No clubbing or cyanosis. No edema.  Neuro: Alert and oriented X 3. No focal deficits noted. Psych:  Good affect, responds appropriately Skin: No rashes or lesions noted.  Labs:   Lab Results  Component Value Date   WBC 7.9 12/18/2015   HGB 15.5 12/18/2015   HCT 46.3 12/18/2015   MCV 95.9 12/18/2015   PLT 224 12/18/2015    Recent Labs  12/18/15 1258  INR 1.54     Recent Labs Lab 12/18/15 1258  NA 136  K 4.9  CL 103  CO2 28  BUN 12  CREATININE 0.83  CALCIUM 9.5  GLUCOSE 113*   Magnesium  Date Value Ref Range Status  12/18/2015 2.1 1.7 - 2.4 mg/dL Final   Lab Results  Component Value Date   CHOL 123 06/14/2015   HDL 50.50 06/14/2015   LDLCALC 63 06/14/2015   TRIG 47.0 06/14/2015    TSH  Date/Time Value Ref Range Status  06/14/2015 10:32 AM 0.60 0.35 - 4.50 uIU/mL Final   Echo: TEE done at CABG 1. Aortic valve: The aortic valve was trileaflet. The leaflets   opened normally without restriction. There was no aortic   insufficiency.    2. Mitral valve: There was trace mitral insufficiency. The   leaflets opened normally without restriction. The leaflets   coapted normally without prolapsing or flail segments noted.    3. Left ventricle: There was normal appearing left ventricular   systolic function with an ejection fraction estimated at 55-60%.   There was mild concentric left ventricular hypertrophy and left   ventricular wall thickness measured 10.5 cm at the inferior wall   at end diastole at the mid-papillary level. Left ventricular   end-diastolic diameter measured 37 mm. There were no regional   wall motion abnormalities.    4. Right ventricle: The right ventricular cavity was of normal   size. There was normal contractility of the right ventricular   free wall and normal appearing right ventricular function.    5. Tricuspid valve: The tricuspid valve appeared structurally   normal with trace tricuspid insufficiency.    6. Interatrial septum: The interatrial septum was intact without   evidence of patent foramen ovale or atrial septal defect by color   Doppler and bubble study.    7. Left atrium: Left atrial cavity did not appear enlarged. There   was no thrombus noted within left atrial appendage or left atrial   cavity.    8. Ascending aorta: There was a well-defined aortic root and   sinotubular ridge without dilatation or effacement. The proximal   aortic root measured 2.75 cm in diameter.    9. Descending aorta: The descending aorta showed mild   atheromatous disease and measured 2.75 cm in diameter.    Post-bypass findings:    1. Aortic valve: The aortic  valve appeared unchanged from the   pre-bypass study. The leaflets opened without  restriction and   there was no aortic insufficiency.    2. Mitral valve: The mitral valve appeared unchanged from the   pre-bypass study. There was trace mitral insufficiency.    3. Left ventricle: There was normal appearing left ventricular   systolic function. There were no regional wall motion   abnormalities noted. There was a small amount of air noted within   the left ventricular cavity upon separation cardiopulmonary   bypass but this resolved over the next 5-10 minutes.    4. Right ventricle: There was normal appearing right ventricular   function. The right ventricular size was normal and there was   normal contractility of the right ventricular free wall.    5. Tricuspid valve: Tricuspid leaflets opened normally and there   was trace to 1+ tricuspid insufficiency.  ECG:  #1 Atrial flutter, RVR, HR 155 #2 Atrial flutter, controlled VR, HR 75  Cath: 03/2015 1. LM lesion, 75% stenosed. 2. Ost Cx lesion, 99% stenosed. 3. Ost 1st Mrg to 1st Mrg lesion, 75% stenosed. 4. Prox LAD to Mid LAD lesion, 75% stenosed. 5. Ost 1st Diag lesion, 75% stenosed. 6. Ost 2nd Diag to 2nd Diag lesion, 65% stenosed.        3rd Diag lesion, 85% stenosed. LV function was normal. He underwent bypass surgery  Radiology:  Dg Chest Port 1 View Result Date: 12/18/2015 CLINICAL DATA:  Atrial flutter and tachycardia EXAM: PORTABLE CHEST 1 VIEW COMPARISON:  April 17, 2015 FINDINGS: Lungs are somewhat hyperexpanded, stable. There is no edema or consolidation. The heart size and pulmonary vascularity are normal. No adenopathy. There is atherosclerotic calcification in the aorta. Patient is status post coronary artery bypass grafting. There is a left atrial appendage clamp. There is degenerative change in each shoulder. IMPRESSION: No edema or consolidation. Lungs hyperexpanded, stable. There is aortic atherosclerosis. Electronically Signed   By: Lowella Grip III M.D.   On: 12/18/2015 14:54    ASSESSMENT  AND PLAN:   The patient was seen today by Dr Radford Pax, the patient evaluated and the data reviewed.  Principal Problem: 1.  Paroxysmal atrial fibrillation with rapid ventricular response (HCC) - HR somewhat improved on IV Cardizem, but has not converted.  - Has required DCCV x 2 in the past. Had MAZE procedure w/ his CABG, but had recurrence of afib after that.  - duration of fib/flutter approx 36 hours.  - MD advise if pt can get DCCV or will need TEE with it.   Active Problems: 2.  Chronic anticoagulation - INR was sub-therapeutic on 11/01 and again today - will start heparin and continue coumadin  3. HTN - DBP is elevated, SBP is improved on the IV Cardizem   SignedRosaria Ferries, PA-C 12/18/2015 6:05 PM Beeper [id number]  Co-Sign MD

## 2015-12-18 NOTE — ED Notes (Signed)
Cardizem stopped, HR in 50's. Cards paged

## 2015-12-18 NOTE — Telephone Encounter (Signed)
I have notified the triage nurse at the [facility] ER that he will be coming.

## 2015-12-18 NOTE — ED Triage Notes (Signed)
Pt reports rapid HR that started Sunday in the middle of the night. He reports hx of atrial flutter. Pt reports he tried vagal maneuvers at home without improvement.

## 2015-12-18 NOTE — ED Notes (Signed)
Cards repaged, EKG obtained and given to Pfeiffer MD

## 2015-12-18 NOTE — ED Provider Notes (Signed)
[place] DEPT Provider Note   CSN: [id number] Arrival date & time: 12/18/15  1236     History   Chief Complaint Chief Complaint  Patient presents with  . Tachycardia    HPI Jonathan Orozco is a 82 y.o. male.  The history is provided by the patient.   CC: tachycardia  Onset/Duration: 2 days Timing: constant Severity: moderate Modifying Factors:  Improved by: nothing (tried vaga maneuvers).  Worsened by: nothing Associated Signs/Symptoms:  Pertinent (+): palpitations  Pertinent (-): chest pain, SOB, dizziness, leg swelling, nausea, emesis, abd pain  Past Medical History:  Diagnosis Date  . ABUSE, ALCOHOL, IN REMISSION 04/08/2007  . ALLERGIC RHINITIS 09/29/2006  . Atrial flutter ([place]) 03/28/2010   a. s/p prior rfca;  b. recurrent paroxysmal flutter 04/2012;  c. pradaxa initiated 04/2012.  [name] BENIGN PROSTATIC HYPERTROPHY 09/29/2006  . BPPV (benign paroxysmal positional vertigo) 04/08/2007  . CAD (coronary artery disease)    a. LHC 2/17: EF 55-65%, LM 75, pLAD 75, oD1 75, oD2 65, D3 85, oLCx 99, oOM1 75, pRCA 25 >> CABG  . Carotid stenosis    a. Carotid US 2/17:  Bilateral ICA 1-39% ICA  . COLONIC POLYPS, HX OF 06/23/2007  . 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 Diverticulosis   . ERECTILE DYSFUNCTION 09/29/2006  . GLUCOSE INTOLERANCE 03/19/2010  . HYPERLIPIDEMIA 09/29/2006  . HYPERTENSION 09/29/2006  . Long term (current) use of anticoagulants 04/26/2010  . MELANOMA, MALIGNANT, SKIN NOS 09/29/2006   other skin cancers -no further melanoma  . OSTEOARTHROSIS NOS, OTHER [facility] SITE 09/29/2006  . Other specified forms of hearing loss 08/10/2009  . VENTRICULAR TACHYCARDIA 09/29/2006    Patient Active Problem List   Diagnosis Date Noted  . Chronic anticoagulation 12/18/2015  . Bilateral hearing loss 07/02/2015  . Hyperglycemia 06/17/2015  . CAD (coronary artery disease) 03/14/2015  . Exertional angina ([place]) 03/12/2015  . CAD (coronary artery disease), native coronary  artery 03/12/2015  . Crescendo angina ([place])   . Paroxysmal atrial fibrillation with rapid ventricular response ([place])   . Abnormal chest x-ray 01/22/2015  . Heme positive stool 10/26/2014  . Constipation 09/29/2014  . Encounter for therapeutic drug monitoring 06/20/2013  . Solitary pulmonary nodule 05/24/2013  . Urinary incontinence, male, stress 05/21/2012  . Anxiety state 05/21/2012  . Chronic low back pain 05/21/2012  . Hyperkalemia 05/19/2012  . Long term (current) use of anticoagulants 04/24/2012  . Atrial flutter/Fibrillation 04/23/2012  . Hearing loss of both ears 11/11/2010  . Encounter for preventative adult health care exam with abnormal findings 05/24/2010  . FATIGUE 11/16/2009  . COLONIC POLYPS, HX OF 06/23/2007  . ABUSE, ALCOHOL, IN REMISSION 04/08/2007  . VERTIGO 04/08/2007  . MELANOMA, MALIGNANT, SKIN NOS 09/29/2006  . Hyperlipidemia 09/29/2006  . ERECTILE DYSFUNCTION 09/29/2006  . 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 VENTRICULAR TACHYCARDIA 09/29/2006  . ALLERGIC RHINITIS 09/29/2006  . BENIGN PROSTATIC HYPERTROPHY 09/29/2006  . OSTEOARTHROSIS NOS, OTHER [facility] SITE 09/29/2006    Past Surgical History:  Procedure Laterality Date  . CARDIAC CATHETERIZATION N/A 03/12/2015   Procedure: Left Heart Cath and Coronary Angiography;  Surgeon: [name], MD;  Location: [place] CV LAB;  Service: Cardiovascular;  Laterality: N/A;  . CATARACT EXTRACTION, BILATERAL Bilateral   . CLIPPING OF ATRIAL APPENDAGE N/A 03/14/2015   Procedure: CLIPPING OF ATRIAL APPENDAGE;  Surgeon: [name], MD;  Location: [place];  Service: Open Heart Surgery;  Laterality: N/A;  . COLONOSCOPY WITH PROPOFOL N/A 12/08/2014  Procedure: COLONOSCOPY WITH PROPOFOL;  Surgeon: [name], MD;  Location: [facility];  Service: Endoscopy;  Laterality: N/A;  . CORONARY ARTERY BYPASS GRAFT N/A 03/14/2015   Procedure: CORONARY ARTERY BYPASS GRAFTING (CABG) x 4 (LIMA to LAD, SVG to DIAGONAL  2, SVG SEQUENTIALLY to OM1 and OM2);  Surgeon: [name], MD;  Location: [place];  Service: Open Heart Surgery;  Laterality: N/A;  . JOINT REPLACEMENT     Right knee  . Left knee arthroscopy    . Left rotato cuff    . TEE WITHOUT CARDIOVERSION N/A 03/14/2015   Procedure: TRANSESOPHAGEAL ECHOCARDIOGRAM (TEE);  Surgeon: [name], MD;  Location: [place];  Service: Open Heart Surgery;  Laterality: N/A;  . TONSILLECTOMY         Home Medications    Prior to Admission medications   Medication Sig Start Date End Date Taking? Authorizing Provider  ALPRAZolam (XANAX) 0.25 MG tablet Take 0.25 mg by mouth 3 (three) times daily as needed for anxiety.   Yes Historical Provider, MD  atorvastatin (LIPITOR) 10 MG tablet Take 1 tablet (10 mg total) by mouth daily. 06/15/15  Yes Biagio Borg, MD  fish oil-omega-3 fatty acids 1000 MG capsule Take 1 g by mouth daily.    Yes Historical Provider, MD  HYDROcodone-acetaminophen (NORCO/VICODIN) 5-325 MG tablet Take 1 tablet by mouth every 6 (six) hours as needed for moderate pain (body pain). Patient taking differently: Take 1 tablet by mouth daily as needed for moderate pain (body pain).  12/07/15  Yes Biagio Borg, MD  metoprolol (LOPRESSOR) 50 MG tablet Take 1 tablet (50 mg total) by mouth 2 (two) times daily. Patient taking differently: Take 25 mg by mouth daily.  12/05/15  Yes Deboraha Sprang, MD  Multiple Vitamin (MULTIVITAMIN) tablet Take 1 tablet by mouth daily.     Yes Historical Provider, MD  warfarin (COUMADIN) 5 MG tablet Take as directed by Coumadin clinic. Patient taking differently: Take 5-7.5 mg by mouth daily at 6 PM. Takes 5mg  all days except wed-takes 7.5mg  10/16/15  Yes Deboraha Sprang, MD  Probiotic Product [facility]) CAPS Take 1 capsule by mouth daily. Patient not taking: Reported on 12/18/2015 12/08/12   Irene Shipper, MD  tamsulosin (FLOMAX) 0.4 MG CAPS capsule Take 1 capsule (0.4 mg total) by mouth daily. Patient not taking:  Reported on 12/18/2015 10/31/15   Biagio Borg, MD    Family History Family History  Problem Relation Age of Onset  . Diabetes Brother   . Esophageal cancer Father   . Colon cancer Neg Hx   . Stomach cancer Neg Hx     Social History Social History  Substance Use Topics  . Smoking status: Former Research scientist (life sciences)  . Smokeless tobacco: Never Used     Comment: Quit smoking and drinking 20 years ago  . Alcohol use No     Allergies   Amiodarone hcl and Ace inhibitors   Review of Systems Review of Systems Ten systems are reviewed and are negative for acute change except as noted in the HPI   Physical Exam Updated Vital Signs BP (!) 115/103   Pulse (!) 151   Temp 98 F (36.7 C) (Oral)   Resp 12   SpO2 99%   Physical Exam  Constitutional: He is oriented to person, place, and time. He appears well-developed and well-nourished. No distress.  HENT:  Head: Normocephalic and atraumatic.  Nose: Nose normal.  Eyes: Conjunctivae and EOM are normal. Pupils  are equal, round, and reactive to light. Right eye exhibits no discharge. Left eye exhibits no discharge. No scleral icterus.  Neck: Normal range of motion. Neck supple.  Cardiovascular: Regular rhythm.  Tachycardia present.  Exam reveals no gallop and no friction rub.   No murmur heard. Pulmonary/Chest: Effort normal and breath sounds normal. No stridor. No respiratory distress. He has no rales.  Abdominal: Soft. He exhibits no distension. There is no tenderness.  Musculoskeletal: He exhibits no edema or tenderness.  Neurological: He is alert and oriented to person, place, and time.  Skin: Skin is warm and dry. No rash noted. He is not diaphoretic. No erythema.  Psychiatric: He has a normal mood and affect.  Vitals reviewed.    ED Treatments / Results  Labs (all labs ordered are listed, but only abnormal results are displayed) Labs Reviewed  BASIC METABOLIC PANEL - Abnormal; Notable for the following:       Result Value    Glucose, Bld 113 (*)    All other components within normal limits  PROTIME-INR - Abnormal; Notable for the following:    Prothrombin Time 18.7 (*)    All other components within normal limits  CBC  MAGNESIUM    EKG  EKG Interpretation None       Radiology Dg Chest Port 1 View  Result Date: 12/18/2015 CLINICAL DATA:  Atrial flutter and tachycardia EXAM: PORTABLE CHEST 1 VIEW COMPARISON:  April 17, 2015 FINDINGS: Lungs are somewhat hyperexpanded, stable. There is no edema or consolidation. The heart size and pulmonary vascularity are normal. No adenopathy. There is atherosclerotic calcification in the aorta. Patient is status post coronary artery bypass grafting. There is a left atrial appendage clamp. There is degenerative change in each shoulder. IMPRESSION: No edema or consolidation. Lungs hyperexpanded, stable. There is aortic atherosclerosis. Electronically Signed   By: Lowella Grip III M.D.   On: 12/18/2015 14:54    Procedures Procedures (including critical care time)  Medications Ordered in ED Medications  diltiazem (CARDIZEM) injection 20 mg (20 mg Intravenous Given 12/18/15 1403)  sodium chloride 0.9 % bolus 1,000 mL (0 mLs Intravenous Stopped 12/18/15 1500)  diltiazem (CARDIZEM) 100 mg in dextrose 5 % 100 mL (1 mg/mL) infusion (15 mg/hr Intravenous Rate/Dose Change 12/18/15 1709)     Initial Impression / Assessment and Plan / ED Course  I have reviewed the triage vital signs and the nursing notes.  Pertinent labs & imaging results that were available during my care of the patient were reviewed by me and considered in my medical decision making (see chart for details).  Clinical Course     She noted to be in a flutter. Rate improved with 20 mg bolus of diltiazem however rhythm not converted. Diltiazem drip initiated. Patient remained hemodynamically stable. Cardiology consulted for further evaluation and likely admission.  Final Clinical Impressions(s) / ED  Diagnoses   Final diagnoses:  Atrial flutter, unspecified type [facility])   Patient care turned over to Dr Johnney Killian at 1700. Patient case and results discussed in detail; please see their note for further ED managment.       Fatima Blank, MD 12/18/15 [phone]

## 2015-12-18 NOTE — Telephone Encounter (Signed)
I called and spoke with Mrs. Fifita. She reports that she and the patient were out of town over the weekend, but Sunday night, the patient started to have HR's around 142 bpm.  He did not report this to his wife until yesterday- he has continued with HR's in the 140 - 150 range up until now.  He is just reporting fatigue at this point. He has taken his metoprolol and has not missed any doses of his warfarin. I have advised Mrs. Montalvo to have the patient report to the [facility] ER for further evaluation of his rate/ rhythm.  She voices understanding.

## 2015-12-18 NOTE — Progress Notes (Signed)
ANTICOAGULATION CONSULT NOTE - Initial Consult  Pharmacy Consult for Heparin (on Coumadin PTA) Indication: atrial fibrillation  Allergies  Allergen Reactions  . Amiodarone Hcl Other (See Comments)    REACTION: intolerance Patient reports photosensitivity  . Ace Inhibitors Cough    Patient Measurements: Height: 6' (182.9 cm) Weight: 175 lb 12.8 oz (79.7 kg) IBW/kg (Calculated) : 77.6   Vital Signs: Temp: 97.8 F (36.6 C) (11/14 2057) Temp Source: Oral (11/14 2057) BP: 139/90 (11/14 2057) Pulse Rate: 75 (11/14 2057)  Labs:  Recent Labs  12/18/15 1258  HGB 15.5  HCT 46.3  PLT 224  LABPROT 18.7*  INR 1.54  CREATININE 0.83    Estimated Creatinine Clearance: 71.4 mL/min (by C-G formula based on SCr of 0.83 mg/dL).   Medical History: Past Medical History:  Diagnosis Date  . ABUSE, ALCOHOL, IN REMISSION 04/08/2007  . ALLERGIC RHINITIS 09/29/2006  . Atrial flutter ([place]) 03/28/2010   a. s/p prior rfca;  b. recurrent paroxysmal flutter 04/2012;  c. pradaxa initiated 04/2012.  [name] BENIGN PROSTATIC HYPERTROPHY 09/29/2006  . BPPV (benign paroxysmal positional vertigo) 04/08/2007  . CAD (coronary artery disease)    a. LHC 2/17: EF 55-65%, LM 75, pLAD 75, oD1 75, oD2 65, D3 85, oLCx 99, oOM1 75, pRCA 25 >> CABG  . Carotid stenosis    a. Carotid US 2/17:  Bilateral ICA 1-39% ICA  . COLONIC POLYPS, HX OF 06/23/2007  . 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 Diverticulosis   . ERECTILE DYSFUNCTION 09/29/2006  . GLUCOSE INTOLERANCE 03/19/2010  . HYPERLIPIDEMIA 09/29/2006  . HYPERTENSION 09/29/2006  . Long term (current) use of anticoagulants 04/26/2010  . MELANOMA, MALIGNANT, SKIN NOS 09/29/2006   other skin cancers -no further melanoma  . OSTEOARTHROSIS NOS, OTHER [facility] SITE 09/29/2006  . Other specified forms of hearing loss 08/10/2009  . VENTRICULAR TACHYCARDIA 09/29/2006    Assessment: 82 year old male on Coumadin PTA for Afib, INR subtherapeutic on admission at 1.55, pharmacy to  resume Coumadin and add a heparin bridge  Dose PTA = 5 mg daily except 7.5 mg on Wednesdays  Goal of Therapy:  Heparin level 0.3-0.7 units/ml Monitor platelets by anticoagulation protocol: Yes  INR = 2 to 3   Plan:  Coumadin 7.5 mg po x 1 at 1800 pm Heparin 2000 units iv bolus x 1 Heparin drip at 1100 units / hr Daily heparin level, CBC, INR  Thank you Anette Guarneri, PharmD [phone]  12/18/2015,9:06 PM

## 2015-12-18 NOTE — Telephone Encounter (Signed)
Patient c/o Palpitations:  High priority if patient c/o lightheadedness and shortness of breath.  1. How long have you been having palpitations? 12/16/15  2. Are you currently experiencing lightheadedness and shortness of breath? no  3. Have you checked your BP and heart rate? (document readings)  HR 150 4. Are you experiencing any other symptoms?  Feeling nervous

## 2015-12-18 NOTE — ED Notes (Signed)
Patient undressed, in gown, on monitor, continuous pulse oximetry and blood pressure cuff; visitor at bedside; warm blankets given

## 2015-12-18 NOTE — ED Notes (Signed)
Spoke to EDP patient to receive cardizem d/t afib

## 2015-12-19 ENCOUNTER — Encounter (HOSPITAL_COMMUNITY): Payer: Self-pay

## 2015-12-19 ENCOUNTER — Inpatient Hospital Stay (HOSPITAL_COMMUNITY): Payer: Medicare Other | Admitting: Anesthesiology

## 2015-12-19 ENCOUNTER — Inpatient Hospital Stay (HOSPITAL_COMMUNITY): Payer: Medicare Other

## 2015-12-19 ENCOUNTER — Encounter (HOSPITAL_COMMUNITY)
Admission: EM | Disposition: A | Discharge status: Home / Self Care | Payer: Self-pay | Source: Home / Self Care | Attending: Cardiology

## 2015-12-19 DIAGNOSIS — I340 Nonrheumatic mitral (valve) insufficiency: Secondary | ICD-10-CM

## 2015-12-19 DIAGNOSIS — I480 Paroxysmal atrial fibrillation: Principal | ICD-10-CM

## 2015-12-19 DIAGNOSIS — I4891 Unspecified atrial fibrillation: Secondary | ICD-10-CM

## 2015-12-19 DIAGNOSIS — I10 Essential (primary) hypertension: Secondary | ICD-10-CM

## 2015-12-19 HISTORY — PX: TEE WITHOUT CARDIOVERSION: SHX5443

## 2015-12-19 HISTORY — PX: CARDIOVERSION: SHX1299

## 2015-12-19 LAB — HEPARIN LEVEL (UNFRACTIONATED)
Heparin Unfractionated: 0.33 IU/mL (ref 0.30–0.70)
Heparin Unfractionated: 0.38 IU/mL (ref 0.30–0.70)

## 2015-12-19 LAB — COMPREHENSIVE METABOLIC PANEL
ALT: 24 U/L (ref 17–63)
ANION GAP: 7 (ref 5–15)
AST: 24 U/L (ref 15–41)
Albumin: 3.5 g/dL (ref 3.5–5.0)
Alkaline Phosphatase: 82 U/L (ref 38–126)
BUN: 9 mg/dL (ref 6–20)
CHLORIDE: 103 mmol/L (ref 101–111)
CO2: 29 mmol/L (ref 22–32)
CREATININE: 0.8 mg/dL (ref 0.61–1.24)
Calcium: 9.3 mg/dL (ref 8.9–10.3)
GFR calc non Af Amer: >60 mL/min (ref >60)
Glucose, Bld: 111 mg/dL — ABNORMAL HIGH (ref 65–99)
Potassium: 4.8 mmol/L (ref 3.5–5.1)
SODIUM: 139 mmol/L (ref 135–145)
Total Bilirubin: 1.6 mg/dL — ABNORMAL HIGH (ref 0.3–1.2)
Total Protein: 6.3 g/dL — ABNORMAL LOW (ref 6.5–8.1)

## 2015-12-19 LAB — CBC
HEMATOCRIT: 45.1 % (ref 39.0–52.0)
Hemoglobin: 15 g/dL (ref 13.0–17.0)
MCH: 31.8 pg (ref 26.0–34.0)
MCHC: 33.3 g/dL (ref 30.0–36.0)
MCV: 95.6 fL (ref 78.0–100.0)
PLATELETS: 220 10*3/uL (ref 150–400)
RBC: 4.72 MIL/uL (ref 4.22–5.81)
RDW: 13.1 % (ref 11.5–15.5)
WBC: 6.7 10*3/uL (ref 4.0–10.5)

## 2015-12-19 LAB — PROTIME-INR
INR: 1.74
Prothrombin Time: 20.5 seconds — ABNORMAL HIGH (ref 11.4–15.2)

## 2015-12-19 SURGERY — ECHOCARDIOGRAM, TRANSESOPHAGEAL
Anesthesia: General

## 2015-12-19 MED ORDER — WARFARIN SODIUM 5 MG PO TABS
5.0000 mg | ORAL_TABLET | Freq: Once | ORAL | Status: AC
  Administered 2015-12-19: 5 mg via ORAL
  Filled 2015-12-19: qty 1

## 2015-12-19 MED ORDER — METOPROLOL TARTRATE 5 MG/5ML IV SOLN
5.0000 mg | Freq: Once | INTRAVENOUS | Status: AC
  Administered 2015-12-19: 5 mg via INTRAVENOUS

## 2015-12-19 MED ORDER — METOPROLOL TARTRATE 50 MG PO TABS
50.0000 mg | ORAL_TABLET | Freq: Two times a day (BID) | ORAL | Status: DC
  Administered 2015-12-20: 50 mg via ORAL
  Filled 2015-12-19: qty 1

## 2015-12-19 MED ORDER — METOPROLOL TARTRATE 50 MG PO TABS
50.0000 mg | ORAL_TABLET | Freq: Once | ORAL | Status: AC
  Administered 2015-12-19: 50 mg via ORAL
  Filled 2015-12-19: qty 1

## 2015-12-19 MED ORDER — PROPOFOL 10 MG/ML IV BOLUS
INTRAVENOUS | Status: DC | PRN
  Administered 2015-12-19 (×2): 50 mg via INTRAVENOUS

## 2015-12-19 MED ORDER — LIDOCAINE HCL (CARDIAC) 20 MG/ML IV SOLN
INTRAVENOUS | Status: DC | PRN
  Administered 2015-12-19 (×4): 50 mg via INTRAVENOUS

## 2015-12-19 MED ORDER — METOPROLOL TARTRATE 50 MG PO TABS: 50.0000 mg | ORAL_TABLET | Freq: Two times a day (BID) | ORAL | Status: DC

## 2015-12-19 MED ORDER — METOPROLOL TARTRATE 5 MG/5ML IV SOLN
INTRAVENOUS | Status: AC
  Administered 2015-12-19: 5 mg via INTRAVENOUS
  Filled 2015-12-19: qty 5

## 2015-12-19 MED ORDER — LACTATED RINGERS IV SOLN: INTRAVENOUS | Status: DC | PRN

## 2015-12-19 MED ORDER — SODIUM CHLORIDE 0.9 % IV SOLN: INTRAVENOUS | Status: DC

## 2015-12-19 MED ORDER — ONDANSETRON HCL 4 MG/2ML IJ SOLN: 4.0000 mg | Freq: Once | INTRAMUSCULAR | Status: DC | PRN

## 2015-12-19 MED ORDER — METOPROLOL TARTRATE 5 MG/5ML IV SOLN
INTRAVENOUS | Status: AC
  Administered 2015-12-19: 5 mg
  Filled 2015-12-19: qty 5

## 2015-12-19 NOTE — Transfer of Care (Signed)
Immediate Anesthesia Transfer of Care Note  Patient: Jonathan Orozco  Procedure(s) Performed: Procedure(s): TRANSESOPHAGEAL ECHOCARDIOGRAM (TEE) (N/A) CARDIOVERSION (N/A)  Patient Location: PACU  Anesthesia Type:General  Level of Consciousness: awake and alert   Airway & Oxygen Therapy: Patient Spontanous Breathing and Patient connected to nasal cannula oxygen  Post-op Assessment: Report given to RN, Post -op Vital signs reviewed and stable and Patient moving all extremities X 4  Post vital signs: Reviewed and stable  Last Vitals:  Vitals:   12/19/15 0948 12/19/15 1240  BP:  (!) 151/116  Pulse: (!) 135 (!) 137  Resp:  18  Temp:      Last Pain:  Vitals:   12/19/15 1240  TempSrc: Oral  PainSc:          Complications: No apparent anesthesia complications

## 2015-12-19 NOTE — CV Procedure (Signed)
    Electrical Cardioversion Procedure Note ASHTEN RAPKIN [id number] Jun 30, 1928  Procedure: Electrical Cardioversion Indications:  Atrial Flutter  Time Out: Verified patient identification, verified procedure,medications/allergies/relevent history reviewed, required imaging and test results available.  Performed  Procedure Details  The patient was NPO after midnight. Anesthesia was administered at the beside  by anesthesia with propofol.  Cardioversion was performed with synchronized biphasic defibrillation via AP pads with 120 joules.  1 attempt(s) were performed.  The patient converted to normal sinus rhythm. The patient tolerated the procedure well   IMPRESSION:  Successful cardioversion of atrial flutter.    Candee Furbish 12/19/2015, 1:58 PM

## 2015-12-19 NOTE — Progress Notes (Signed)
[facility] for Heparin  Indication: atrial fibrillation  Allergies  Allergen Reactions  . Amiodarone Hcl Other (See Comments)    REACTION: intolerance Patient reports photosensitivity  . Ace Inhibitors Cough    Patient Measurements: Height: 6' (182.9 cm) Weight: 174 lb (78.9 kg) IBW/kg (Calculated) : 77.6   Vital Signs: Temp: 96.8 F (36 C) (11/15 0425) Temp Source: Oral (11/15 1240) BP: 146/65 (11/15 1425) Pulse Rate: 69 (11/15 1425)  Labs:  Recent Labs  12/18/15 1258 12/19/15 0434 12/19/15 1051  HGB 15.5 15.0  --   HCT 46.3 45.1  --   PLT 224 220  --   LABPROT 18.7* 20.5*  --   INR 1.54 1.74  --   HEPARINUNFRC  --  0.33 0.38  CREATININE 0.83 0.80  --     Estimated Creatinine Clearance: 74.1 mL/min (by C-G formula based on SCr of 0.8 mg/dL).  Assessment: 82 y.o. male with Afib , currently on IV heparin bridge/coumadin. He was on Coumadin PTA for h/o Afib. Heparin level is 0.38, therapeutic on current heparin rate 1100 units/hr.   INR today is 1.74 CBC remains wnl.  No bleeding noted.  S/p successful cardioversion of atrial flutter today.   Warfarin Dose PTA = 5 mg daily except 7.5 mg on Wednesdays, last taken 11/13 INR subtherapeutic 1.54 on admission 12/18/15.  Goal of Therapy:  INR = 2-3 Heparin level 0.3-0.7 units/ml Monitor platelets by anticoagulation protocol: Yes    Plan:  Continue Heparin at current rate 1100 units/hr Coumadin 5 mg today x1 Daily HL, INR, CBC Monitor for S/Sx of bleeding  Thank you for allowing pharmacy to be part of this patients care team.  Nicole Cella, RPh Clinical Pharmacist Pager: [phone] 12/19/2015,2:41 PM

## 2015-12-19 NOTE — Progress Notes (Signed)
Patient HR went to 140's patient felt the palpitations, BP 122/93. Spoke to both [place], [place] and Dr. Sallyanne Kuster. New orders for 5 mg IV Lopressor now and Lopressor PO ordered. Patient had no complaints of SOB, Will continue to monitor.

## 2015-12-19 NOTE — Anesthesia Preprocedure Evaluation (Addendum)
Anesthesia Evaluation  Patient identified by MRN, date of birth, ID band Patient awake    Reviewed: Allergy & Precautions, NPO status , Patient's Chart, lab work & pertinent test results, reviewed documented beta blocker date and time   Airway Mallampati: I  TM Distance: >3 FB Neck ROM: Full    Dental  (+) Edentulous Upper, Edentulous Lower, Upper Dentures, Dental Advidsory Given   Pulmonary former smoker,    Pulmonary exam normal breath sounds clear to auscultation       Cardiovascular hypertension, Pt. on medications and Pt. on home beta blockers + angina with exertion + CAD, + CABG and + Peripheral Vascular Disease  Normal cardiovascular exam+ dysrhythmias Atrial Fibrillation and Supra Ventricular Tachycardia  Rhythm:Regular Rate:Tachycardia  CABG x 4 03/2015   Neuro/Psych Anxiety Benign Positional vertigo    GI/Hepatic negative GI ROS, Neg liver ROS,   Endo/Other  negative endocrine ROSHyperlipidemia  Renal/GU negative Renal ROS   BPH    Musculoskeletal  (+) Arthritis , Osteoarthritis,    Abdominal   Peds  Hematology negative hematology ROS (+)   Anesthesia Other Findings   Reproductive/Obstetrics                           Lab Results  Component Value Date   WBC 6.7 12/19/2015   HGB 15.0 12/19/2015   HCT 45.1 12/19/2015   MCV 95.6 12/19/2015   PLT 220 12/19/2015     Chemistry      Component Value Date/Time   NA 139 12/19/2015 0434   K 4.8 12/19/2015 0434   CL 103 12/19/2015 0434   CO2 29 12/19/2015 0434   BUN 9 12/19/2015 0434   CREATININE 0.80 12/19/2015 0434      Component Value Date/Time   CALCIUM 9.3 12/19/2015 0434   ALKPHOS 82 12/19/2015 0434   AST 24 12/19/2015 0434   ALT 24 12/19/2015 0434   BILITOT 1.6 (H) 12/19/2015 0434     EKG: sinus tachycardia vs atrial flutter with 2:1 block, 137/min. Left heart cath 03/2015: [place] 2/17: EF 55-65%, LM 75, pLAD 75, oD1 75,  oD2 65, D3 85, oLCx 99, oOM1 75, pRCA 25, CABG Anesthesia Physical Anesthesia Plan  ASA: III  Anesthesia Plan: General   Post-op Pain Management:    Induction: Intravenous  Airway Management Planned: Mask  Additional Equipment:   Intra-op Plan:   Post-operative Plan:   Informed Consent: I have reviewed the patients History and Physical, chart, labs and discussed the procedure including the risks, benefits and alternatives for the proposed anesthesia with the patient or authorized representative who has indicated his/her understanding and acceptance.   Dental Advisory Given  Plan Discussed with: Anesthesiologist, CRNA and Surgeon  Anesthesia Plan Comments:        Anesthesia Quick Evaluation

## 2015-12-19 NOTE — Interval H&P Note (Signed)
History and Physical Interval Note:  12/19/2015 1:26 PM  Jonathan Orozco  has presented today for surgery, with the diagnosis of a fib  The various methods of treatment have been discussed with the patient and family. After consideration of risks, benefits and other options for treatment, the patient has consented to  Procedure(s): TRANSESOPHAGEAL ECHOCARDIOGRAM (TEE) (N/A) CARDIOVERSION (N/A) as a surgical intervention .  The patient's history has been reviewed, patient examined, no change in status, stable for surgery.  I have reviewed the patient's chart and labs.  Questions were answered to the patient's satisfaction.     [employer]

## 2015-12-19 NOTE — H&P (View-Only) (Signed)
Patient Care Team: Biagio Borg, MD as PCP - General   HPI  Jonathan Orozco is a 82 y.o. male Seen in followup for outflow tract ectopy and atrial flutters for which  he underwent EP testing and 3-D mapping which failed to produce stable arrhythmias; medical therapy was undertaken.  He has been intolerant of amiodarone. 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   He's had no significant interval arrhythmias  His atrial arrhythmias have been managed with rate control and anticoagulation with warfarin. Previously he had been on NOACs but was intolerant     2/17 he presented with exertional chest discomfort and underwent catheterization.  1. LM lesion, 75% stenosed. 2. Ost Cx lesion, 99% stenosed. 3. Ost 1st Mrg to 1st Mrg lesion, 75% stenosed. 4. Prox LAD to Mid LAD lesion, 75% stenosed. 5. Ost 1st Diag lesion, 75% stenosed. 6. Ost 2nd Diag to 2nd Diag lesion, 65% stenosed.        3rd Diag lesion, 85% stenosed. LV function was normal. He underwent bypass surgery  He has had no intercurrent chest discomfort.  He is walking 30 minutes a day. He recently changed the radiator in his truck.   5/17 LDL 63  Past Medical History:  Diagnosis Date  . ABUSE, ALCOHOL, IN REMISSION 04/08/2007  . ALLERGIC RHINITIS 09/29/2006  . Atrial flutter ([place]) 03/28/2010   a. s/p prior rfca;  b. recurrent paroxysmal flutter 04/2012;  c. pradaxa initiated 04/2012.  [name] BENIGN PROSTATIC HYPERTROPHY 09/29/2006  . BPPV (benign paroxysmal positional vertigo) 04/08/2007  . CAD (coronary artery disease)    a. LHC 2/17: EF 55-65%, LM 75, pLAD 75, oD1 75, oD2 65, D3 85, oLCx 99, oOM1 75, pRCA 25 >> CABG  . Carotid stenosis    a. Carotid US 2/17:  Bilateral ICA 1-39% ICA  . COLONIC POLYPS, HX OF 06/23/2007  . 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 Diverticulosis   . ERECTILE DYSFUNCTION 09/29/2006  . GLUCOSE INTOLERANCE 03/19/2010  . HYPERLIPIDEMIA  09/29/2006  . HYPERTENSION 09/29/2006  . Long term (current) use of anticoagulants 04/26/2010  . MELANOMA, MALIGNANT, SKIN NOS 09/29/2006   other skin cancers -no further melanoma  . OSTEOARTHROSIS NOS, OTHER [facility] SITE 09/29/2006  . Other specified forms of hearing loss 08/10/2009  . VENTRICULAR TACHYCARDIA 09/29/2006    Past Surgical History:  Procedure Laterality Date  . CARDIAC CATHETERIZATION N/A 03/12/2015   Procedure: Left Heart Cath and Coronary Angiography;  Surgeon: [name], MD;  Location: [facility];  Service: Cardiovascular;  Laterality: N/A;  . CATARACT EXTRACTION, BILATERAL Bilateral   . CLIPPING OF ATRIAL APPENDAGE N/A 03/14/2015   Procedure: CLIPPING OF ATRIAL APPENDAGE;  Surgeon: [name], MD;  Location: [place];  Service: Open Heart Surgery;  Laterality: N/A;  . COLONOSCOPY WITH PROPOFOL N/A 12/08/2014   Procedure: COLONOSCOPY WITH PROPOFOL;  Surgeon: [name], MD;  Location: [facility];  Service: Endoscopy;  Laterality: N/A;  . CORONARY ARTERY BYPASS GRAFT N/A 03/14/2015   Procedure: CORONARY ARTERY BYPASS GRAFTING (CABG) x 4 (LIMA to LAD, SVG to DIAGONAL 2, SVG SEQUENTIALLY to OM1 and OM2);  Surgeon: [name], MD;  Location: [place];  Service: Open Heart Surgery;  Laterality: N/A;  . JOINT REPLACEMENT     Right knee  . Left knee arthroscopy    . Left rotato cuff    . TEE WITHOUT CARDIOVERSION N/A 03/14/2015   Procedure:  TRANSESOPHAGEAL ECHOCARDIOGRAM (TEE);  Surgeon: [name], MD;  Location: [facility];  Service: Open Heart Surgery;  Laterality: N/A;  . TONSILLECTOMY      Current Outpatient Prescriptions  Medication Sig Dispense Refill  . atorvastatin (LIPITOR) 10 MG tablet Take 1 tablet (10 mg total) by mouth daily. 90 tablet 3  . fish oil-omega-3 fatty acids 1000 MG capsule Take 1 g by mouth daily.     [name] HYDROcodone-acetaminophen (NORCO/VICODIN) 5-325 MG tablet Take 1 tablet by mouth every 6 (six) hours as needed for moderate pain  (body pain). 90 tablet 0  . metoprolol (LOPRESSOR) 50 MG tablet Take 1 tablet (50 mg total) by mouth 2 (two) times daily. 60 tablet 3  . Multiple Vitamin (MULTIVITAMIN) tablet Take 1 tablet by mouth daily.      . Probiotic Product (RESTORA) CAPS Take 1 capsule by mouth daily. 10 capsule 0  . psyllium (METAMUCIL) 58.6 % packet Take 1 packet by mouth daily.    . tamsulosin (FLOMAX) 0.4 MG CAPS capsule Take 1 capsule (0.4 mg total) by mouth daily. 90 capsule 1  . warfarin (COUMADIN) 5 MG tablet Take as directed by Coumadin clinic. 100 tablet 0   No current facility-administered medications for this visit.     Allergies  Allergen Reactions  . Ace Inhibitors Cough    REACTION: cough  . Amiodarone Hcl     REACTION: intolerance Patient reports photosensitivity    Review of Systems negative except from HPI and PMH  Physical Exam BP (!) 142/78   Pulse 87   Ht 6' (1.829 m)   Wt 176 lb 9.6 oz (80.1 kg)   SpO2 98%   BMI 23.95 kg/m  Well developed and nourished in no acute distress HENT normal Neck supple with JVP-flat Clear Regular rate and rhythm, no murmurs or gallops Abd-soft with active BS No Clubbing cyanosis edema  Skin-warm and dry A & Oriented  Grossly normal sensory and motor function   ECG demonstrates sinus rhythm  at  78 Intervals 19/13/41 Right bundle branch block  Assessment and  Plan  Atrial fibrillation/flutter paroxysmal   Sinus bradycardia  Hypertension  CAD s/p CABG    Without symptoms of ischemia  No significant arrhythmia. Short bursts  Blood pressures at home have been in the 120 range.  We will continue his current medications.

## 2015-12-19 NOTE — Progress Notes (Signed)
  Echocardiogram 2D Echocardiogram has been performed.  Donata Clay 12/19/2015, 2:19 PM

## 2015-12-19 NOTE — Anesthesia Postprocedure Evaluation (Signed)
Anesthesia Post Note  Patient: Jonathan Orozco  Procedure(s) Performed: Procedure(s) (LRB): TRANSESOPHAGEAL ECHOCARDIOGRAM (TEE) (N/A) CARDIOVERSION (N/A)  Patient location during evaluation: PACU Anesthesia Type: General Level of consciousness: awake and alert and oriented Pain management: pain level controlled Vital Signs Assessment: post-procedure vital signs reviewed and stable Respiratory status: spontaneous breathing, nonlabored ventilation and respiratory function stable Cardiovascular status: blood pressure returned to baseline and stable Postop Assessment: no signs of nausea or vomiting Anesthetic complications: no    Last Vitals:  Vitals:   12/19/15 1415 12/19/15 1425  BP: (!) 145/71 (!) 146/65  Pulse: 73 69  Resp: (!) 8 12  Temp:      Last Pain:  Vitals:   12/19/15 1240  TempSrc: Oral  PainSc:                  Eesha Schmaltz A.

## 2015-12-19 NOTE — Progress Notes (Signed)
Connell for Heparin  Indication: atrial fibrillation  Allergies  Allergen Reactions  . Amiodarone Hcl Other (See Comments)    REACTION: intolerance Patient reports photosensitivity  . Ace Inhibitors Cough    Patient Measurements: Height: 6' (182.9 cm) Weight: 174 lb (78.9 kg) IBW/kg (Calculated) : 77.6   Vital Signs: Temp: 96.8 F (36 C) (11/15 0425) Temp Source: Oral (11/15 0425) BP: 123/93 (11/15 0425) Pulse Rate: 132 (11/15 0425)  Labs:  Recent Labs  12/18/15 1258 12/19/15 0434  HGB 15.5 15.0  HCT 46.3 45.1  PLT 224 220  LABPROT 18.7* 20.5*  INR 1.54 1.74  HEPARINUNFRC  --  0.33  CREATININE 0.83 0.80    Estimated Creatinine Clearance: 74.1 mL/min (by C-G formula based on SCr of 0.8 mg/dL).  Assessment: 82 y.o. male with Afib for heparin  Goal of Therapy:  Heparin level 0.3-0.7 units/ml Monitor platelets by anticoagulation protocol: Yes    Plan:  Continue Heparin at current rate   Phillis Knack, PharmD, BCPS  12/19/2015,7:09 AM

## 2015-12-19 NOTE — Anesthesia Procedure Notes (Signed)
Procedure Name: MAC Date/Time: 12/19/2015 1:54 PM Performed by: Neldon Newport Pre-anesthesia Checklist: Timeout performed, Patient being monitored, Suction available, Emergency Drugs available and Patient identified Patient Re-evaluated:Patient Re-evaluated prior to inductionOxygen Delivery Method: Ambu bag and Nasal cannula

## 2015-12-19 NOTE — Progress Notes (Signed)
SUBJECTIVE: The patient is doing well today.  At this time, he denies chest pain, shortness of breath.  He has awareness of tachycardia.   CURRENT MEDICATIONS: . atorvastatin  10 mg Oral Daily  . metoprolol  25 mg Oral BID  . multivitamin with minerals  1 tablet Oral Daily  . omega-3 acid ethyl esters  1 g Oral Daily  . sodium chloride flush  3 mL Intravenous Q12H  . Warfarin - Pharmacist Dosing Inpatient   Does not apply q1800   . diltiazem (CARDIZEM) infusion Stopped (12/18/15 1946)  . heparin 1,100 Units/hr (12/18/15 2308)    OBJECTIVE: Physical Exam: Vitals:   12/18/15 2015 12/18/15 2030 12/18/15 2057 12/19/15 0425  BP: 113/61 104/71 139/90 (!) 123/93  Pulse: (!) 49 (!) 49 75 (!) 132  Resp: 18 18 16 16   Temp:   97.8 F (36.6 C) (!) 96.8 F (36 C)  TempSrc:   Oral Oral  SpO2: 96% 95% 99% 96%  Weight:   175 lb 12.8 oz (79.7 kg) 174 lb (78.9 kg)  Height:   6' (1.829 m)     Intake/Output Summary (Last 24 hours) at 12/19/15 0846 Last data filed at 12/19/15 0617  Gross per 24 hour  Intake          1557.65 ml  Output             2400 ml  Net          -842.35 ml    Telemetry reveals atrial fibrillation/flutter  GEN- The patient is elderly appearing, alert and oriented x 3 today.   Head- normocephalic, atraumatic Eyes-  Sclera clear, conjunctiva pink Ears- hearing intact Oropharynx- clear Neck- supple  Lungs- Clear to ausculation bilaterally, normal work of breathing Heart- Tachycardic regular rate and rhythm  GI- soft, NT, ND, + BS Extremities- no clubbing, cyanosis, or edema Skin- no rash or lesion Psych- euthymic mood, full affect Neuro- strength and sensation are intact  LABS: Basic Metabolic Panel:  Recent Labs  12/18/15 1258 12/18/15 1430 12/19/15 0434  NA 136  --  139  K 4.9  --  4.8  CL 103  --  103  CO2 28  --  29  GLUCOSE 113*  --  111*  BUN 12  --  9  CREATININE 0.83  --  0.80  CALCIUM 9.5  --  9.3  MG  --  2.1  --    Liver Function  Tests:  Recent Labs  12/19/15 0434  AST 24  ALT 24  ALKPHOS 82  BILITOT 1.6*  PROT 6.3*  ALBUMIN 3.5   CBC:  Recent Labs  12/18/15 1258 12/19/15 0434  WBC 7.9 6.7  HGB 15.5 15.0  HCT 46.3 45.1  MCV 95.9 95.6  PLT 224 220   Thyroid Function Tests:  Recent Labs  12/18/15 2207  TSH 1.156    RADIOLOGY: Dg Chest Port 1 View Result Date: 12/18/2015 CLINICAL DATA:  Atrial flutter and tachycardia EXAM: PORTABLE CHEST 1 VIEW COMPARISON:  April 17, 2015 FINDINGS: Lungs are somewhat hyperexpanded, stable. There is no edema or consolidation. The heart size and pulmonary vascularity are normal. No adenopathy. There is atherosclerotic calcification in the aorta. Patient is status post coronary artery bypass grafting. There is a left atrial appendage clamp. There is degenerative change in each shoulder. IMPRESSION: No edema or consolidation. Lungs hyperexpanded, stable. There is aortic atherosclerosis. Electronically Signed   By: Lowella Grip III M.D.   On: 12/18/2015 14:54  ASSESSMENT AND PLAN:  Principal Problem:   Paroxysmal atrial fibrillation with rapid ventricular response (HCC) Active Problems:   Chronic anticoagulation   PAF (paroxysmal atrial fibrillation) ([place])   1.  Persistent atrial fibrillation/flutter Has been out of rhythm since Sunday and is symptomatic with shortness of breath with exertion. INR subtherapeutic on admission Will need TEE guided cardioversion. Will arrange for today. He was previously on amiodarone but this was discontinued at his request. We discussed today that he may need AAD therapy to maintain SR but as he has been doing so well lately, he does not wish to make changes at this time. Will discuss with Dr [name] Continue Warfarin for [facility] of 4. Pharmacy to dose. Continue IV Heparin until INR >2  2.  HTN Stable No change required today  Dr [name] to see later today.   [name], NP 12/19/2015 8:49 AM  Patient seen and  examined this morning. The plan was to undertake TEE guided cardioversion and maintained on heparin . He will be discharged once his INR is therapeutic. At this point we will hold off on the introduction of antiarrhythmic therapy.

## 2015-12-19 NOTE — Progress Notes (Signed)
Pt's heart rate increased into the 130's. Pt is asymptomatic. EKG shows wide QRS tachycardia. Notified NP. Gave 5 mg IV metoprolol per order. Will continue to monitor

## 2015-12-20 ENCOUNTER — Telehealth: Payer: Self-pay | Admitting: Internal Medicine

## 2015-12-20 ENCOUNTER — Encounter (HOSPITAL_COMMUNITY): Payer: Self-pay | Admitting: Cardiology

## 2015-12-20 ENCOUNTER — Other Ambulatory Visit: Payer: Self-pay | Admitting: Internal Medicine

## 2015-12-20 DIAGNOSIS — I471 Supraventricular tachycardia: Secondary | ICD-10-CM

## 2015-12-20 LAB — CBC
HEMATOCRIT: 42.3 % (ref 39.0–52.0)
Hemoglobin: 13.9 g/dL (ref 13.0–17.0)
MCH: 31.3 pg (ref 26.0–34.0)
MCHC: 32.9 g/dL (ref 30.0–36.0)
MCV: 95.3 fL (ref 78.0–100.0)
Platelets: 204 10*3/uL (ref 150–400)
RBC: 4.44 MIL/uL (ref 4.22–5.81)
RDW: 12.7 % (ref 11.5–15.5)
WBC: 7.5 10*3/uL (ref 4.0–10.5)

## 2015-12-20 LAB — PROTIME-INR
INR: 2.36
Prothrombin Time: 26.3 seconds — ABNORMAL HIGH (ref 11.4–15.2)

## 2015-12-20 LAB — HEPARIN LEVEL (UNFRACTIONATED): Heparin Unfractionated: 0.35 IU/mL (ref 0.30–0.70)

## 2015-12-20 MED ORDER — AMIODARONE HCL 200 MG PO TABS: 200.0000 mg | ORAL_TABLET | Freq: Two times a day (BID) | ORAL | 0 refills | Status: DC

## 2015-12-20 MED ORDER — AMIODARONE HCL 200 MG PO TABS: 200.0000 mg | ORAL_TABLET | Freq: Two times a day (BID) | ORAL | 1 refills | Status: DC

## 2015-12-20 NOTE — Progress Notes (Addendum)
SUBJECTIVE: The patient is doing well with few palpitations    S/p DCCV yesterday ; unfortunately freq atrial ectopy and reversion NOT TO ATRIAL FLUTTER but to atrial tach    CURRENT MEDICATIONS: . atorvastatin  10 mg Oral Daily  . metoprolol  50 mg Oral BID  . multivitamin with minerals  1 tablet Oral Daily  . omega-3 acid ethyl esters  1 g Oral Daily  . sodium chloride flush  3 mL Intravenous Q12H  . Warfarin - Pharmacist Dosing Inpatient   Does not apply q1800   . diltiazem (CARDIZEM) infusion Stopped (12/18/15 1946)  . heparin 1,100 Units/hr (12/19/15 1801)    OBJECTIVE: Physical Exam: Vitals:   12/19/15 1425 12/19/15 1839 12/19/15 2212 12/20/15 0559  BP: (!) 146/65 (!) 153/92 (!) 117/96 111/81  Pulse: 69 (!) 139 (!) 137 (!) 120  Resp: 12  14 17   Temp: 98.1 F (36.7 C)  98.1 F (36.7 C) 98.4 F (36.9 C)  TempSrc: Oral  Oral Oral  SpO2: 97%  96% 95%  Weight:    172 lb 6.4 oz (78.2 kg)  Height:        Intake/Output Summary (Last 24 hours) at 12/20/15 0735 Last data filed at 12/20/15 0100  Gross per 24 hour  Intake              325 ml  Output             1300 ml  Net             -975 ml   Telemetry Personally reviewed    reveals atrial tach with some wenckbach   GEN- The patient is elderly appearing, alert and oriented x 3 today.   Head- normocephalic, atraumatic Eyes-  Sclera clear, conjunctiva pink Ears- hearing intact Oropharynx- clear Neck- supple  Lungs- Clear to ausculation bilaterally, normal work of breathing Heart- Tachycardic regular rate and rhythm  GI- soft, NT, ND, + BS Extremities- no clubbing, cyanosis, or edema Skin- no rash or lesion Psych- euthymic mood, full affect Neuro- strength and sensation are intact  LABS: Basic Metabolic Panel:  Recent Labs  12/18/15 1258 12/18/15 1430 12/19/15 0434  NA 136  --  139  K 4.9  --  4.8  CL 103  --  103  CO2 28  --  29  GLUCOSE 113*  --  111*  BUN 12  --  9  CREATININE 0.83  --  0.80   CALCIUM 9.5  --  9.3  MG  --  2.1  --    Liver Function Tests:  Recent Labs  12/19/15 0434  AST 24  ALT 24  ALKPHOS 82  BILITOT 1.6*  PROT 6.3*  ALBUMIN 3.5   CBC:  Recent Labs  12/19/15 0434 12/20/15 0411  WBC 6.7 7.5  HGB 15.0 13.9  HCT 45.1 42.3  MCV 95.6 95.3  PLT 220 204   Thyroid Function Tests:  Recent Labs  12/18/15 2207  TSH 1.156   INR 2.36  RADIOLOGY: Dg Chest Port 1 View Result Date: 12/18/2015 CLINICAL DATA:  Atrial flutter and tachycardia EXAM: PORTABLE CHEST 1 VIEW COMPARISON:  April 17, 2015 FINDINGS: Lungs are somewhat hyperexpanded, stable. There is no edema or consolidation. The heart size and pulmonary vascularity are normal. No adenopathy. There is atherosclerotic calcification in the aorta. Patient is status post coronary artery bypass grafting. There is a left atrial appendage clamp. There is degenerative change in each shoulder. IMPRESSION: No edema or  consolidation. Lungs hyperexpanded, stable. There is aortic atherosclerosis. Electronically Signed   By: Lowella Grip III M.D.   On: 12/18/2015 14:54    ASSESSMENT AND PLAN:  Principal Problem:   Paroxysmal atrial fibrillation with rapid ventricular response (HCC) Active Problems:   Chronic anticoagulation   PAF (paroxysmal atrial fibrillation) (HCC)   1atrial tach   2.  HTN    INR therapeutic Will stop heparin  Will need antiarrhtyhmic drug Not candidate for 1 C with CAD Discussed and will resume amio 400 bid x 2 weeks, and then 400 daily x 2 weeks and then 200 qd Reviewed amio issue with pt; biggest problem was photosensitivity otherwise well tolerated.  I am not a fan of .dronaderone    Will plan discharge today and outpt cardioversion Will decrease coumadin by about 30 % given resumption of amio and anticipate recheck next Tuesday It will not be a big deal if Amedeo Plenty is subtherapeutic as rhythm prior to and now subsequent to DCCV is prob atrial tach in both settings

## 2015-12-20 NOTE — Discharge Summary (Signed)
ELECTROPHYSIOLOGY PROCEDURE DISCHARGE SUMMARY    Patient ID: Jonathan Orozco,  MRN: [id number], DOB/AGE: 06-11-1928 82 y.o.  Admit date: 12/18/2015 Discharge date: 12/20/2015  Primary Care Physician: [name], MD Electrophysiologist: [name]   Primary Discharge Diagnosis:  Principal Problem:   Paroxysmal atrial fibrillation with rapid ventricular response [facility]) Active Problems:   Chronic anticoagulation   PAF (paroxysmal atrial fibrillation) ([place])  Secondary Diagnosis: 1.  CAD s/p CABG 2.  Hypertension 3.  Hyperlipidemia 4.  BPH  Allergies  Allergen Reactions  . Amiodarone Hcl Other (See Comments)    REACTION: intolerance Patient reports photosensitivity  . Ace Inhibitors Cough    Procedures This Admission:  1.  Transesophageal echocardiogram and direct current cardioversion on 12/19/15 by Dr Marlou Porch. Thee were no early apparent complications.   Brief HPI/Hospital Course:  LATERRENCE Orozco is a 82 y.o. male with a past medical history as outlined above. He has been predominately maintaining SR off AAD therapy, but had recurrence of atrial arrhythmias on Sunday. Because of persistence, he was advised to go to the ER for further evaluation.  On arrival, he was in an atypical atrial flutter with ventricular rate in the 150's.  He was started on IV Diltiazem with improvement in ventricular rate. INR was subtherapeutic on arrival.  He was seen by Dr [name] and underwent TEE guided cardioversion.  He unfortunately reverted back to atrial tachycardia later that day.  Dr [name] has seen the patient and advised reinitiating of amiodarone followed by repeat DCCV next week if he has persistent tachycardia. Plan to discharge today with EKG on Monday. If still in atrial tachycardia/flutter, will repeat DCCV next week.   Physical Exam: Vitals:   12/19/15 1839 12/19/15 2212 12/20/15 0559 12/20/15 0827  BP: (!) 153/92 (!) 117/96 111/81 111/81  Pulse: (!) 139 (!) 137 (!) 120 (!) 125  Resp:   14 17   Temp:  98.1 F (36.7 C) 98.4 F (36.9 C)   TempSrc:  Oral Oral   SpO2:  96% 95%   Weight:   172 lb 6.4 oz (78.2 kg)   Height:        Labs:   Lab Results  Component Value Date   WBC 7.5 12/20/2015   HGB 13.9 12/20/2015   HCT 42.3 12/20/2015   MCV 95.3 12/20/2015   PLT 204 12/20/2015     Recent Labs Lab 12/19/15 0434  NA 139  K 4.8  CL 103  CO2 29  BUN 9  CREATININE 0.80  CALCIUM 9.3  PROT 6.3*  BILITOT 1.6*  ALKPHOS 82  ALT 24  AST 24  GLUCOSE 111*     Discharge Medications:  Current Discharge Medication List    START taking these medications   Details  amiodarone (PACERONE) 200 MG tablet Take 1 tablet (200 mg total) by mouth 2 (two) times daily. Qty: 60 tablet, Refills: 1      CONTINUE these medications which have NOT CHANGED   Details  ALPRAZolam (XANAX) 0.25 MG tablet Take 0.25 mg by mouth 3 (three) times daily as needed for anxiety.    atorvastatin (LIPITOR) 10 MG tablet Take 1 tablet (10 mg total) by mouth daily. Qty: 90 tablet, Refills: 3    fish oil-omega-3 fatty acids 1000 MG capsule Take 1 g by mouth daily.     HYDROcodone-acetaminophen (NORCO/VICODIN) 5-325 MG tablet Take 1 tablet by mouth every 6 (six) hours as needed for moderate pain (body pain). Qty: 90 tablet, Refills: 0  metoprolol (LOPRESSOR) 50 MG tablet Take 1 tablet (50 mg total) by mouth 2 (two) times daily. Qty: 180 tablet, Refills: 3   Associated Diagnoses: Paroxysmal atrial fibrillation (HCC)    Multiple Vitamin (MULTIVITAMIN) tablet Take 1 tablet by mouth daily.      warfarin (COUMADIN) 5 MG tablet Take as directed by Coumadin clinic. Qty: 100 tablet, Refills: 0    Probiotic Product (RESTORA) CAPS Take 1 capsule by mouth daily. Qty: 10 capsule, Refills: 0    tamsulosin (FLOMAX) 0.4 MG CAPS capsule Take 1 capsule (0.4 mg total) by mouth daily. Qty: 90 capsule, Refills: 1        Disposition: Pt is being discharged home today in good condition.  Follow-up  Information    [place] [facility] Follow up on 12/24/2015.   Specialty:  Cardiology Why:  at 11:00AM for Coumadin check and EKG.   Contact information: [address] [place] [phone]          Duration of Discharge Encounter: Greater than 30 minutes including physician time.  Signed, [name], NP 12/20/2015 10:38 AM

## 2015-12-20 NOTE — Progress Notes (Signed)
Patient being discharged home per MD order. All discharge instructions given to patient and wife in written form.

## 2015-12-20 NOTE — Telephone Encounter (Signed)
New message  Pt's wife called  Please call and advise/had EKG scheduled for today/we have canceled the appt  Wants to know if the EKG can be done at the hospital while he is still there  Dr. [name] has said pt will be discharged today  Please call wife back

## 2015-12-20 NOTE — Telephone Encounter (Signed)
I left a message on Mrs. Bon's identified voice mail that the patient will need to come in on Monday 12/24/15 for his EKG (post hospital) so we can follow up on his rhythm after re-starting amiodarone. I have asked that she call back with any further questions.

## 2015-12-20 NOTE — Care Management Note (Signed)
Case Management Note  Patient Details  Name: Jonathan Orozco MRN: [id number] Date of Birth: [birth year]  Subjective/Objective:  Pt presented for A Fib-S/p DCCV yesterday. Plan to return home with wife on 12-20-15.                   Action/Plan: No needs identified by CM at this time.   Expected Discharge Date:                  Expected Discharge Plan:  Home/Self Care  In-House Referral:  NA  Discharge planning Services  CM Consult  Post Acute Care Choice:  NA Choice offered to:  NA  DME Arranged:  N/A DME Agency:  NA  HH Arranged:  NA HH Agency:  NA  Status of Service:  Completed, signed off  If discussed at [place] of Stay Meetings, dates discussed:    Additional Comments:  Bethena Roys, RN 12/20/2015, 12:06 PM

## 2015-12-24 ENCOUNTER — Ambulatory Visit (INDEPENDENT_AMBULATORY_CARE_PROVIDER_SITE_OTHER): Payer: Medicare Other

## 2015-12-24 ENCOUNTER — Ambulatory Visit (INDEPENDENT_AMBULATORY_CARE_PROVIDER_SITE_OTHER): Payer: Medicare Other | Admitting: *Deleted

## 2015-12-24 VITALS — BP 124/76 | HR 113 | Wt 176.0 lb

## 2015-12-24 DIAGNOSIS — Z5181 Encounter for therapeutic drug level monitoring: Secondary | ICD-10-CM

## 2015-12-24 DIAGNOSIS — I4892 Unspecified atrial flutter: Secondary | ICD-10-CM | POA: Diagnosis not present

## 2015-12-24 LAB — POCT INR: INR: 2.6

## 2015-12-24 NOTE — Progress Notes (Signed)
1.) Reason for visit: EKG  2.) Name of MD requesting visit: Dr. [name]  3.) H&P:  Pt recently in hospital for TEE/Cardioversion.  Started on amiodarone with hospital follow up EKG today.   4.) ROS related to problem: Pt reports he is feeling well.  States he has nervous feeling when heart rate elevated up to 140's.  Rate is 113 today and pt reports he "hardly notices it."  5.) Assessment and plan per MD:EKG  reviewed with Dr. [name].  As long as pt is feeling OK will plan for cardioversion next week.  If starts to have symptoms will need cardioversion this week. I gave pt instructions regarding plan. I asked him to contact us if he started feeling poorly.  Pt aware Nira Conn will contact him to schedule cardioversion.

## 2015-12-26 ENCOUNTER — Telehealth: Payer: Self-pay | Admitting: Internal Medicine

## 2015-12-26 NOTE — Telephone Encounter (Signed)
New Message:    He wants Dr [name] to know his heart went back in rhythm around 3:00 this morning. He says he will wait for further instructions from Dr [name].

## 2015-12-26 NOTE — Telephone Encounter (Signed)
I called and spoke with Mrs. Jonathan Orozco- the patient did revert back to NSR about 3:00 am.  His HR is currently 60 bpm. Per Dr. [name]- continue amiodarone 200 mg BID x 4 weeks total, then decrease to 200 mg once daily, follow up with the [facility] in 6 weeks.  I have advised the patient's wife to decrease amiodarone to 200 mg daily on 01/16/16. She is aware I will forward a message to the [facility] to have them please call and schedule him for follow up in 6 weeks. She is agreeable with the above plan and verbalizes understanding.

## 2015-12-29 DIAGNOSIS — R000 Tachycardia, unspecified: Secondary | ICD-10-CM

## 2016-01-03 ENCOUNTER — Ambulatory Visit (INDEPENDENT_AMBULATORY_CARE_PROVIDER_SITE_OTHER): Payer: Medicare Other | Admitting: *Deleted

## 2016-01-03 DIAGNOSIS — I4892 Unspecified atrial flutter: Secondary | ICD-10-CM | POA: Diagnosis not present

## 2016-01-03 DIAGNOSIS — Z5181 Encounter for therapeutic drug level monitoring: Secondary | ICD-10-CM

## 2016-01-03 LAB — POCT INR: INR: 4.9

## 2016-01-05 ENCOUNTER — Telehealth: Payer: Self-pay | Admitting: Internal Medicine

## 2016-01-05 NOTE — Telephone Encounter (Signed)
Was contacted by Mrs. Sada, the wife of Jonathan Orozco.  She reports that her husband's heart rate has been in the high 40's and low 50's starting tonight.  She was told to call a physician if his heart rate became low.  I was informed that he was without any symptoms and did not feel lightheaded or dizzy.   I informed his wife that if his heart rate remains low in the morning, he should not take any additional metoprolol until she contacts his primary cardiologist.  She was educated on symptoms to look out for and report if his heart rate becomes a problem.  Liborio Nixon, MD

## 2016-01-10 ENCOUNTER — Ambulatory Visit (INDEPENDENT_AMBULATORY_CARE_PROVIDER_SITE_OTHER): Payer: Medicare Other | Admitting: *Deleted

## 2016-01-10 DIAGNOSIS — I4892 Unspecified atrial flutter: Secondary | ICD-10-CM | POA: Diagnosis not present

## 2016-01-10 DIAGNOSIS — Z5181 Encounter for therapeutic drug level monitoring: Secondary | ICD-10-CM

## 2016-01-10 LAB — POCT INR: INR: 2.3

## 2016-01-23 ENCOUNTER — Other Ambulatory Visit (INDEPENDENT_AMBULATORY_CARE_PROVIDER_SITE_OTHER): Payer: Medicare Other

## 2016-01-23 ENCOUNTER — Ambulatory Visit (INDEPENDENT_AMBULATORY_CARE_PROVIDER_SITE_OTHER): Payer: Medicare Other | Admitting: Internal Medicine

## 2016-01-23 ENCOUNTER — Encounter: Payer: Self-pay | Admitting: Internal Medicine

## 2016-01-23 VITALS — BP 130/70 | HR 71 | Temp 98.3°F | Resp 20 | Wt 175.0 lb

## 2016-01-23 DIAGNOSIS — H9193 Unspecified hearing loss, bilateral: Secondary | ICD-10-CM

## 2016-01-23 DIAGNOSIS — R32 Unspecified urinary incontinence: Secondary | ICD-10-CM | POA: Diagnosis not present

## 2016-01-23 DIAGNOSIS — R269 Unspecified abnormalities of gait and mobility: Secondary | ICD-10-CM

## 2016-01-23 DIAGNOSIS — R350 Frequency of micturition: Secondary | ICD-10-CM | POA: Diagnosis not present

## 2016-01-23 LAB — URINALYSIS, ROUTINE W REFLEX MICROSCOPIC
Bilirubin Urine: NEGATIVE
Hgb urine dipstick: NEGATIVE
Ketones, ur: NEGATIVE
Leukocytes, UA: NEGATIVE
Nitrite: NEGATIVE
PH: 6 (ref 5.0–8.0)
RBC / HPF: NONE SEEN (ref 0–?)
SPECIFIC GRAVITY, URINE: 1.02 (ref 1.000–1.030)
TOTAL PROTEIN, URINE-UPE24: NEGATIVE
URINE GLUCOSE: NEGATIVE
Urobilinogen, UA: 0.2 (ref 0.0–1.0)

## 2016-01-23 MED ORDER — SOLIFENACIN SUCCINATE 5 MG PO TABS
5.0000 mg | ORAL_TABLET | Freq: Every day | ORAL | 3 refills | Status: DC
Start: 2016-01-23 — End: 2016-02-08

## 2016-01-23 NOTE — Progress Notes (Signed)
Subjective:    Patient ID: Jonathan Orozco, male    DOB: 1928-10-10, 82 y.o.   MRN: [id number]  HPI Here to f/u; overall doing ok,  Pt denies chest pain, increasing sob or doe, wheezing, orthopnea, PND, increased LE swelling, palpitations, dizziness or syncope.  Pt denies new neurological symptoms such as new headache, or facial or extremity weakness or numbness.  Pt denies polydipsia, polyuria, or low sugar episode.   Pt denies new neurological symptoms such as new headache, or facial or extremity weakness or numbness.   Pt states overall good compliance with meds  Does have bilat hearing loss worse in the past wk - ? Wax again. No recent worsening allergy or infectious symptoms  Recent CABG complicated by afib s/p cardioversion but did hot hold.  Now on amio and lopressor at 25 mg per day per cardiology despite the 50 bid on the EMR.    Now with urinary incontinence due to random urgency and frequency, but denies urinary symptoms such as dysuria, flank pain, hematuria or n/v, fever, chills.  Plans to self refer to urology but just wanted to mention this.  Also has to really watch the balance, in the past few months, such as not bending forward too far so he wont topple over.  Wife uses cane, and pt has one at home as well, not clear why not used recently. Feels fall risk is increased, so is willing to start. No falls. Past Medical History:  Diagnosis Date  . ABUSE, ALCOHOL, IN REMISSION 04/08/2007  . ALLERGIC RHINITIS 09/29/2006  . Atrial flutter ([place]) 03/28/2010   a. s/p prior rfca;  b. recurrent paroxysmal flutter 04/2012;  c. pradaxa initiated 04/2012.  [name] BENIGN PROSTATIC HYPERTROPHY 09/29/2006  . BPPV (benign paroxysmal positional vertigo) 04/08/2007  . CAD (coronary artery disease)    a. LHC 2/17: EF 55-65%, LM 75, pLAD 75, oD1 75, oD2 65, D3 85, oLCx 99, oOM1 75, pRCA 25 >> CABG  . Carotid stenosis    a. Carotid US 2/17:  Bilateral ICA 1-39% ICA  . COLONIC POLYPS, HX OF 06/23/2007  . DISORDERS,  ORGANIC INSOMNIA NOS 09/29/2006  . Diverticulosis   . ERECTILE DYSFUNCTION 09/29/2006  . GLUCOSE INTOLERANCE 03/19/2010  . HYPERLIPIDEMIA 09/29/2006  . HYPERTENSION 09/29/2006  . Long term (current) use of anticoagulants 04/26/2010  . MELANOMA, MALIGNANT, SKIN NOS 09/29/2006   other skin cancers -no further melanoma  . OSTEOARTHROSIS NOS, OTHER [facility] SITE 09/29/2006  . Other specified forms of hearing loss 08/10/2009  . VENTRICULAR TACHYCARDIA 09/29/2006   Past Surgical History:  Procedure Laterality Date  . CARDIAC CATHETERIZATION N/A 03/12/2015   Procedure: Left Heart Cath and Coronary Angiography;  Surgeon: [name], MD;  Location: [place] [facility];  Service: Cardiovascular;  Laterality: N/A;  . CARDIOVERSION N/A 12/19/2015   Procedure: CARDIOVERSION;  Surgeon: [name], MD;  Location: [place];  Service: Cardiovascular;  Laterality: N/A;  . CATARACT EXTRACTION, BILATERAL Bilateral   . CLIPPING OF ATRIAL APPENDAGE N/A 03/14/2015   Procedure: CLIPPING OF ATRIAL APPENDAGE;  Surgeon: [name], MD;  Location: [place];  Service: Open Heart Surgery;  Laterality: N/A;  . COLONOSCOPY WITH PROPOFOL N/A 12/08/2014   Procedure: COLONOSCOPY WITH PROPOFOL;  Surgeon: [name], MD;  Location: [facility];  Service: Endoscopy;  Laterality: N/A;  . CORONARY ARTERY BYPASS GRAFT N/A 03/14/2015   Procedure: CORONARY ARTERY BYPASS GRAFTING (CABG) x 4 (LIMA to LAD, SVG to DIAGONAL 2, SVG SEQUENTIALLY to  OM1 and OM2);  Surgeon: [name], MD;  Location: [place];  Service: Open Heart Surgery;  Laterality: N/A;  . JOINT REPLACEMENT     Right knee  . Left knee arthroscopy    . Left rotato cuff    . TEE WITHOUT CARDIOVERSION N/A 03/14/2015   Procedure: TRANSESOPHAGEAL ECHOCARDIOGRAM (TEE);  Surgeon: [name], MD;  Location: [employer];  Service: Open Heart Surgery;  Laterality: N/A;  . TEE WITHOUT CARDIOVERSION N/A 12/19/2015   Procedure: TRANSESOPHAGEAL ECHOCARDIOGRAM (TEE);   Surgeon: [name], MD;  Location: [facility] END[facility];  Service: Cardiovascular;  Laterality: N/A;  . TONSILLECTOMY      reports that he has quit smoking. He has never used smokeless tobacco. He reports that he does not drink alcohol or use drugs. family history includes Diabetes in his brother; Esophageal cancer in his father. Allergies  Allergen Reactions  . Amiodarone Hcl Other (See Comments)    REACTION: intolerance Patient reports photosensitivity  . Ace Inhibitors Cough   Current Outpatient Prescriptions on File Prior to Visit  Medication Sig Dispense Refill  . amiodarone (PACERONE) 200 MG tablet Take 1 tablet (200 mg total) by mouth 2 (two) times daily. 180 tablet 0  . atorvastatin (LIPITOR) 10 MG tablet Take 1 tablet (10 mg total) by mouth daily. 90 tablet 3  . fish oil-omega-3 fatty acids 1000 MG capsule Take 1 g by mouth daily.     [name] HYDROcodone-acetaminophen (NORCO/VICODIN) 5-325 MG tablet Take 1 tablet by mouth every 6 (six) hours as needed for moderate pain (body pain). (Patient taking differently: Take 1 tablet by mouth daily as needed for moderate pain (body pain). ) 90 tablet 0  . metoprolol (LOPRESSOR) 50 MG tablet Take 1 tablet (50 mg total) by mouth 2 (two) times daily. (Patient taking differently: Take 25 mg by mouth daily. ) 180 tablet 3  . Multiple Vitamin (MULTIVITAMIN) tablet Take 1 tablet by mouth daily.      [name] warfarin (COUMADIN) 5 MG tablet Take as directed by Coumadin clinic. (Patient taking differently: Take 5-7.5 mg by mouth daily at 6 PM. Takes 5mg  all days except wed-takes 7.5mg ) 100 tablet 0   No current facility-administered medications on file prior to visit.    Review of Systems  Constitutional: Negative for unusual diaphoresis or night sweats HENT: Negative for ear swelling or discharge Eyes: Negative for worsening visual haziness  Respiratory: Negative for choking and stridor.   Gastrointestinal: Negative for distension or worsening  eructation Genitourinary: Negative for retention or change in urine volume.  Musculoskeletal: Negative for other MSK pain or swelling Skin: Negative for color change and worsening wound Neurological: Negative for tremors and numbness other than noted  Psychiatric/Behavioral: Negative for decreased concentration or agitation other than above   All other system neg per pt    Objective:   Physical Exam BP 130/70   Pulse 71   Temp 98.3 F (36.8 C) (Oral)   Resp 20   Wt 175 lb (79.4 kg)   SpO2 96%   BMI 23.73 kg/m  VS noted, not ill appearing Constitutional: Pt appears in no apparent distress HENT: Head: NCAT.  Right Ear: External ear normal.  Left Ear: External ear normal.  Bilat canals clear and hearing improved after bilat wax impaction irrigated Eyes: . Pupils are equal, round, and reactive to light. Conjunctivae and EOM are normal Neck: Normal range of motion. Neck supple.  Cardiovascular: Normal rate and regular rhythm.   Pulmonary/Chest: Effort normal and  breath sounds without rales or wheezing.  Abd:  Soft, NT, ND, + BS, no flank tender Neurological: Pt is alert. Not confused , motor grossly intact but gait mild unsteady with reduced postural reflexes, no cogwheeling or shuffling or tremor Skin: Skin is warm. No rash, no LE edema Psychiatric: Pt behavior is normal. No agitation.  No other new exam findings    Assessment & Plan:

## 2016-01-23 NOTE — Progress Notes (Signed)
Pre visit review using our clinic review tool, if applicable. No additional management support is needed unless otherwise documented below in the visit note. 

## 2016-01-23 NOTE — Patient Instructions (Addendum)
Please take all new medication as prescribed - the bladder medication called vesicare (you may wish to call your pharmacy to see about the copay)  Please keep your cane with you at all times for safety with walking  Please continue all other medications as before, and refills have been done if requested.  Please have the pharmacy call with any other refills you may need.  Please continue your efforts at being more active, low cholesterol diet, and weight control.  Please make an appointment with Urology as you menitoned, or call back if you would like me to refer  Please keep your appointments with your specialists as you may have planned  Please go to the LAB in the Basement (turn left off the elevator) for the tests to be done today - just the urine testing today  You will be contacted by phone if any changes need to be made immediately.  Otherwise, you will receive a letter about your results with an explanation, but please check with MyChart first.  Please remember to sign up for MyChart if you have not done so, as this will be important to you in the future with finding out test results, communicating by private email, and scheduling acute appointments online when needed.

## 2016-01-24 ENCOUNTER — Ambulatory Visit (INDEPENDENT_AMBULATORY_CARE_PROVIDER_SITE_OTHER): Payer: Medicare Other | Admitting: *Deleted

## 2016-01-24 DIAGNOSIS — Z5181 Encounter for therapeutic drug level monitoring: Secondary | ICD-10-CM | POA: Diagnosis not present

## 2016-01-24 DIAGNOSIS — I4892 Unspecified atrial flutter: Secondary | ICD-10-CM

## 2016-01-24 LAB — POCT INR: INR: 3.9

## 2016-01-24 LAB — URINE CULTURE: Organism ID, Bacteria: NO GROWTH

## 2016-01-27 DIAGNOSIS — R350 Frequency of micturition: Secondary | ICD-10-CM | POA: Insufficient documentation

## 2016-01-27 DIAGNOSIS — R269 Unspecified abnormalities of gait and mobility: Secondary | ICD-10-CM | POA: Insufficient documentation

## 2016-01-27 NOTE — Assessment & Plan Note (Signed)
Suspect related to OAB, for UA but likely neg for infectious, ok for start vesicare 5 qd or similar covered by his insurance

## 2016-01-27 NOTE — Assessment & Plan Note (Signed)
Improved with irrigation bilat,  to f/u any worsening symptoms or concerns

## 2016-01-27 NOTE — Assessment & Plan Note (Signed)
Pt plans to self refer to urology after jan 1,  to f/u any worsening symptoms or concerns

## 2016-01-27 NOTE — Assessment & Plan Note (Addendum)
With recent worsening unsteady gait, duobt PD or similar, does not appear to need PT, Mild to mod, to walk with can at all times in and out of house for safety, o/w stable overall by history and exam, recent data reviewed with pt, and pt to continue medical treatment as before,  to f/u any worsening symptoms or concerns, may need to revisit coumadin use for onset of falls, declines neurology referral for now Lab Results  Component Value Date   WBC 7.5 12/20/2015   HGB 13.9 12/20/2015   HCT 42.3 12/20/2015   PLT 204 12/20/2015   GLUCOSE 111 (H) 12/19/2015   CHOL 123 06/14/2015   TRIG 47.0 06/14/2015   HDL 50.50 06/14/2015   LDLDIRECT 159.2 09/29/2006   LDLCALC 63 06/14/2015   ALT 24 12/19/2015   AST 24 12/19/2015   NA 139 12/19/2015   K 4.8 12/19/2015   CL 103 12/19/2015   CREATININE 0.80 12/19/2015   BUN 9 12/19/2015   CO2 29 12/19/2015   TSH 1.156 12/18/2015   PSA 1.96 03/16/2009   INR 3.9 01/24/2016   HGBA1C 5.9 06/14/2015   Note:  Total time for pt hx, exam, review of record with pt in the room, determination of diagnoses and plan for further eval and tx is > 40 min, with over 50% spent in coordination and counseling of patient

## 2016-02-07 ENCOUNTER — Ambulatory Visit (INDEPENDENT_AMBULATORY_CARE_PROVIDER_SITE_OTHER): Payer: Medicare Other | Admitting: *Deleted

## 2016-02-07 DIAGNOSIS — Z5181 Encounter for therapeutic drug level monitoring: Secondary | ICD-10-CM

## 2016-02-07 DIAGNOSIS — I4892 Unspecified atrial flutter: Secondary | ICD-10-CM | POA: Diagnosis not present

## 2016-02-07 LAB — POCT INR: INR: 3.1

## 2016-02-08 ENCOUNTER — Encounter (HOSPITAL_COMMUNITY): Payer: Self-pay | Admitting: Nurse Practitioner

## 2016-02-08 ENCOUNTER — Ambulatory Visit (HOSPITAL_COMMUNITY)
Admission: RE | Admit: 2016-02-08 | Discharge: 2016-02-08 | Disposition: A | Discharge status: Home / Self Care | Payer: Medicare Other | Source: Ambulatory Visit | Attending: Nurse Practitioner | Admitting: Nurse Practitioner

## 2016-02-08 VITALS — BP 162/90 | HR 80 | Ht 72.0 in | Wt 176.4 lb

## 2016-02-08 DIAGNOSIS — I4892 Unspecified atrial flutter: Secondary | ICD-10-CM | POA: Diagnosis not present

## 2016-02-08 DIAGNOSIS — Z87891 Personal history of nicotine dependence: Secondary | ICD-10-CM | POA: Insufficient documentation

## 2016-02-08 DIAGNOSIS — I4891 Unspecified atrial fibrillation: Secondary | ICD-10-CM | POA: Diagnosis not present

## 2016-02-08 DIAGNOSIS — Z5189 Encounter for other specified aftercare: Secondary | ICD-10-CM | POA: Diagnosis not present

## 2016-02-08 DIAGNOSIS — Z79899 Other long term (current) drug therapy: Secondary | ICD-10-CM | POA: Diagnosis not present

## 2016-02-08 DIAGNOSIS — Z9889 Other specified postprocedural states: Secondary | ICD-10-CM | POA: Diagnosis not present

## 2016-02-08 NOTE — Progress Notes (Signed)
Primary Care Physician: [name], MD Referring Physician: Dr. [name] is a 83 y.o. male with a h/o afib.He was in [facility] 11/14 thru 11-16 for treatment of aflutter with RVR. He has been on amiodarone years ago and had been in SR off ADD therapy.   He was started on IV Diltiazem with improvement in ventricular rate. INR was subtherapeutic on arrival.  He was seen by Dr [name] and underwent TEE guided cardioversion.  He unfortunately reverted back to atrial tachycardia later that day. Dr [name] advised reinitiating of amiodarone followed by repeat DCCV next week if he has persistent tachycardia. He did convert on his own and is feeling well. He is now on amiodarone 200 mg a day. His coumadin is followed by coumadin clinic checked last yesterday at 3.1. BP intially elevated by rechecked by me at 140/84.  Today, he denies symptoms of palpitations, chest pain, shortness of breath, orthopnea, PND, lower extremity edema, dizziness, presyncope, syncope, or neurologic sequela. The patient is tolerating medications without difficulties and is otherwise without complaint today.   Past Medical History:  Diagnosis Date  . ABUSE, ALCOHOL, IN REMISSION 04/08/2007  . ALLERGIC RHINITIS 09/29/2006  . Atrial flutter ([place]) 03/28/2010   a. s/p prior rfca;  b. recurrent paroxysmal flutter 04/2012;  c. pradaxa initiated 04/2012.  [name] BENIGN PROSTATIC HYPERTROPHY 09/29/2006  . BPPV (benign paroxysmal positional vertigo) 04/08/2007  . CAD (coronary artery disease)    a. LHC 2/17: EF 55-65%, LM 75, pLAD 75, oD1 75, oD2 65, D3 85, oLCx 99, oOM1 75, pRCA 25 >> CABG  . Carotid stenosis    a. Carotid US 2/17:  Bilateral ICA 1-39% ICA  . COLONIC POLYPS, HX OF 06/23/2007  . 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 Diverticulosis   . ERECTILE DYSFUNCTION 09/29/2006  . GLUCOSE INTOLERANCE 03/19/2010  . HYPERLIPIDEMIA 09/29/2006  . HYPERTENSION 09/29/2006  . Long term (current) use of anticoagulants 04/26/2010  .  MELANOMA, MALIGNANT, SKIN NOS 09/29/2006   other skin cancers -no further melanoma  . OSTEOARTHROSIS NOS, OTHER [facility] SITE 09/29/2006  . Other specified forms of hearing loss 08/10/2009  . VENTRICULAR TACHYCARDIA 09/29/2006   Past Surgical History:  Procedure Laterality Date  . CARDIAC CATHETERIZATION N/A 03/12/2015   Procedure: Left Heart Cath and Coronary Angiography;  Surgeon: [name], MD;  Location: [place] [facility];  Service: Cardiovascular;  Laterality: N/A;  . CARDIOVERSION N/A 12/19/2015   Procedure: CARDIOVERSION;  Surgeon: [name], MD;  Location: [place];  Service: Cardiovascular;  Laterality: N/A;  . CATARACT EXTRACTION, BILATERAL Bilateral   . CLIPPING OF ATRIAL APPENDAGE N/A 03/14/2015   Procedure: CLIPPING OF ATRIAL APPENDAGE;  Surgeon: [name], MD;  Location: [place];  Service: Open Heart Surgery;  Laterality: N/A;  . COLONOSCOPY WITH PROPOFOL N/A 12/08/2014   Procedure: COLONOSCOPY WITH PROPOFOL;  Surgeon: [name], MD;  Location: [facility];  Service: Endoscopy;  Laterality: N/A;  . CORONARY ARTERY BYPASS GRAFT N/A 03/14/2015   Procedure: CORONARY ARTERY BYPASS GRAFTING (CABG) x 4 (LIMA to LAD, SVG to DIAGONAL 2, SVG SEQUENTIALLY to OM1 and OM2);  Surgeon: [name], MD;  Location: [place];  Service: Open Heart Surgery;  Laterality: N/A;  . JOINT REPLACEMENT     Right knee  . Left knee arthroscopy    . Left rotato cuff    . TEE WITHOUT CARDIOVERSION N/A 03/14/2015   Procedure: TRANSESOPHAGEAL ECHOCARDIOGRAM (TEE);  Surgeon: [name],  MD;  Location: [facility];  Service: Open Heart Surgery;  Laterality: N/A;  . TEE WITHOUT CARDIOVERSION N/A 12/19/2015   Procedure: TRANSESOPHAGEAL ECHOCARDIOGRAM (TEE);  Surgeon: [name], MD;  Location: [place];  Service: Cardiovascular;  Laterality: N/A;  . TONSILLECTOMY      Current Outpatient Prescriptions  Medication Sig Dispense Refill  . amiodarone (PACERONE) 200 MG tablet Take 200 mg  by mouth daily.    [name] atorvastatin (LIPITOR) 10 MG tablet Take 1 tablet (10 mg total) by mouth daily. 90 tablet 3  . fish oil-omega-3 fatty acids 1000 MG capsule Take 1 g by mouth daily.     [name] HYDROcodone-acetaminophen (NORCO/VICODIN) 5-325 MG tablet Take 1 tablet by mouth every 6 (six) hours as needed for moderate pain (body pain). (Patient taking differently: Take 1 tablet by mouth daily as needed for moderate pain (body pain). ) 90 tablet 0  . Multiple Vitamin (MULTIVITAMIN) tablet Take 1 tablet by mouth daily.      . psyllium (METAMUCIL) 58.6 % powder Take 1 packet by mouth 3 (three) times daily.    [name] warfarin (COUMADIN) 5 MG tablet Take as directed by Coumadin clinic. (Patient taking differently: Take 5-7.5 mg by mouth daily at 6 PM. Takes 5mg  all days except wed-takes 7.5mg ) 100 tablet 0   No current facility-administered medications for this encounter.     Allergies  Allergen Reactions  . Amiodarone Hcl Other (See Comments)    REACTION: intolerance Patient reports photosensitivity  . Ace Inhibitors Cough    Social History   Social History  . Marital status: Married    Spouse name: N/A  . Number of children: N/A  . Years of education: N/A   Occupational History  . Retired, Press photographer    Social History Main Topics  . Smoking status: Former Research scientist (life sciences)  . Smokeless tobacco: Never Used     Comment: Quit smoking and drinking 20 years ago  . Alcohol use No  . Drug use: No  . Sexual activity: Not on file   Other Topics Concern  . Not on file   Social History Narrative   Married, 3 children. Retired Press photographer. Lives in [place] with  wife and kids. He is retired from Press photographer.     Family History  Problem Relation Age of Onset  . Diabetes Brother   . Esophageal cancer Father   . Colon cancer Neg Hx   . Stomach cancer Neg Hx     ROS- All systems are reviewed and negative except as per the HPI above  Physical Exam: Vitals:   02/08/16 1109  BP: (!) 162/90  Pulse: 80  Weight: 176 lb  6.4 oz (80 kg)  Height: 6' (1.829 m)   Wt Readings from Last 3 Encounters:  02/08/16 176 lb 6.4 oz (80 kg)  01/23/16 175 lb (79.4 kg)  12/24/15 176 lb (79.8 kg)    Labs: Lab Results  Component Value Date   NA 139 12/19/2015   K 4.8 12/19/2015   CL 103 12/19/2015   CO2 29 12/19/2015   GLUCOSE 111 (H) 12/19/2015   BUN 9 12/19/2015   CREATININE 0.80 12/19/2015   CALCIUM 9.3 12/19/2015   MG 2.1 12/18/2015   Lab Results  Component Value Date   INR 3.1 02/07/2016   Lab Results  Component Value Date   CHOL 123 06/14/2015   HDL 50.50 06/14/2015   LDLCALC 63 06/14/2015   TRIG 47.0 06/14/2015     GEN- The patient is well appearing,  alert and oriented x 3 today.   Head- normocephalic, atraumatic Eyes-  Sclera clear, conjunctiva pink Ears- hearing intact Oropharynx- clear Neck- supple, no JVP Lymph- no cervical lymphadenopathy Lungs- Clear to ausculation bilaterally, normal work of breathing Heart- Regular rate and rhythm, no murmurs, rubs or gallops, PMI not laterally displaced GI- soft, NT, ND, + BS Extremities- no clubbing, cyanosis, or edema MS- no significant deformity or atrophy Skin- no rash or lesion Psych- euthymic mood, full affect Neuro- strength and sensation are intact  EKG-NSR with RBBB, pr int 198 ms, qrs int 138 ms, qtc 493 bpm Epic records reviewed    Assessment and Plan:  1. Aflutter Has converted to SR  Continue amiodarone at 200 mg a day Reviewed liver panel/tsh checked in November Pt did not resume lopressor on d/c but I feel he is ok off drug He will check his BP over the next few weeks to see if controlled at home Continue warfarin  F/u in one month  Butch Penny C. Rubel Heckard, [facility] [address] [place], [place] [address] [phone]

## 2016-02-26 ENCOUNTER — Ambulatory Visit (INDEPENDENT_AMBULATORY_CARE_PROVIDER_SITE_OTHER): Payer: Medicare Other | Admitting: *Deleted

## 2016-02-26 DIAGNOSIS — Z5181 Encounter for therapeutic drug level monitoring: Secondary | ICD-10-CM | POA: Diagnosis not present

## 2016-02-26 DIAGNOSIS — I4892 Unspecified atrial flutter: Secondary | ICD-10-CM

## 2016-02-26 LAB — POCT INR: INR: 2.8

## 2016-03-11 ENCOUNTER — Ambulatory Visit (HOSPITAL_COMMUNITY)
Admission: RE | Admit: 2016-03-11 | Discharge: 2016-03-11 | Disposition: A | Discharge status: Home / Self Care | Payer: Medicare Other | Source: Ambulatory Visit | Attending: Nurse Practitioner | Admitting: Nurse Practitioner

## 2016-03-11 ENCOUNTER — Encounter (HOSPITAL_COMMUNITY): Payer: Self-pay | Admitting: Nurse Practitioner

## 2016-03-11 VITALS — BP 146/94 | HR 134 | Ht 72.0 in | Wt 173.2 lb

## 2016-03-11 DIAGNOSIS — Z87891 Personal history of nicotine dependence: Secondary | ICD-10-CM | POA: Diagnosis not present

## 2016-03-11 DIAGNOSIS — Z951 Presence of aortocoronary bypass graft: Secondary | ICD-10-CM | POA: Diagnosis not present

## 2016-03-11 DIAGNOSIS — I25810 Atherosclerosis of coronary artery bypass graft(s) without angina pectoris: Secondary | ICD-10-CM

## 2016-03-11 DIAGNOSIS — Z79899 Other long term (current) drug therapy: Secondary | ICD-10-CM | POA: Insufficient documentation

## 2016-03-11 DIAGNOSIS — Z5189 Encounter for other specified aftercare: Secondary | ICD-10-CM | POA: Diagnosis present

## 2016-03-11 DIAGNOSIS — I10 Essential (primary) hypertension: Secondary | ICD-10-CM | POA: Insufficient documentation

## 2016-03-11 DIAGNOSIS — I2510 Atherosclerotic heart disease of native coronary artery without angina pectoris: Secondary | ICD-10-CM | POA: Diagnosis not present

## 2016-03-11 DIAGNOSIS — I4892 Unspecified atrial flutter: Secondary | ICD-10-CM | POA: Insufficient documentation

## 2016-03-11 DIAGNOSIS — Z9889 Other specified postprocedural states: Secondary | ICD-10-CM | POA: Insufficient documentation

## 2016-03-11 DIAGNOSIS — I4819 Other persistent atrial fibrillation: Secondary | ICD-10-CM

## 2016-03-11 DIAGNOSIS — I481 Persistent atrial fibrillation: Secondary | ICD-10-CM | POA: Diagnosis not present

## 2016-03-11 DIAGNOSIS — I471 Supraventricular tachycardia: Secondary | ICD-10-CM | POA: Insufficient documentation

## 2016-03-11 LAB — CBC
HEMATOCRIT: 47.7 % (ref 39.0–52.0)
Hemoglobin: 16 g/dL (ref 13.0–17.0)
MCH: 31.5 pg (ref 26.0–34.0)
MCHC: 33.5 g/dL (ref 30.0–36.0)
MCV: 93.9 fL (ref 78.0–100.0)
Platelets: 286 10*3/uL (ref 150–400)
RBC: 5.08 MIL/uL (ref 4.22–5.81)
RDW: 13.1 % (ref 11.5–15.5)
WBC: 9.1 10*3/uL (ref 4.0–10.5)

## 2016-03-11 LAB — COMPREHENSIVE METABOLIC PANEL
ALT: 23 U/L (ref 17–63)
ANION GAP: 10 (ref 5–15)
AST: 26 U/L (ref 15–41)
Albumin: 3.9 g/dL (ref 3.5–5.0)
Alkaline Phosphatase: 103 U/L (ref 38–126)
BILIRUBIN TOTAL: 1 mg/dL (ref 0.3–1.2)
BUN: 9 mg/dL (ref 6–20)
CO2: 26 mmol/L (ref 22–32)
Calcium: 9.4 mg/dL (ref 8.9–10.3)
Chloride: 99 mmol/L — ABNORMAL LOW (ref 101–111)
Creatinine, Ser: 1.07 mg/dL (ref 0.61–1.24)
GFR calc Af Amer: >60 mL/min (ref >60)
GFR calc non Af Amer: >60 mL/min (ref >60)
Glucose, Bld: 141 mg/dL — ABNORMAL HIGH (ref 65–99)
POTASSIUM: 4.6 mmol/L (ref 3.5–5.1)
Sodium: 135 mmol/L (ref 135–145)
TOTAL PROTEIN: 7.2 g/dL (ref 6.5–8.1)

## 2016-03-11 LAB — TSH: TSH: 1.019 u[IU]/mL (ref 0.350–4.500)

## 2016-03-11 MED ORDER — METOPROLOL TARTRATE 50 MG PO TABS: 50.0000 mg | ORAL_TABLET | Freq: Two times a day (BID) | ORAL | 3 refills | Status: DC

## 2016-03-11 NOTE — Progress Notes (Signed)
Primary Care Physician: [name], MD Primary Electrophysiologist: Abbas Taboada is a 83 y.o. male with a history of persistent atrial fibrillation who presents for follow up in the [facility].  Since last being seen in clinic, the patient reports doing reasonably well.  He reverted to AT yesterday morning and has had associated fatigue and exercise intolerance. Today, he  denies symptoms of chest pain, shortness of breath, orthopnea, PND, lower extremity edema, dizziness, presyncope, syncope, snoring, daytime somnolence, bleeding, or neurologic sequela. The patient is tolerating medications without difficulties and is otherwise without complaint today.    Atrial Fibrillation Risk Factors:  he does not have symptoms or diagnosis of sleep apnea.  he does not have a history of rheumatic fever.  he does not have a history of alcohol use.  he has a BMI of Body mass index is 23.49 kg/m.[name] Filed Weights   03/11/16 1145  Weight: 173 lb 3.2 oz (78.6 kg)     Atrial Fibrillation Management history:  Previous antiarrhythmic drugs: amiodarone  Previous cardioversions: 12/2015  Previous ablations: none  CHADS2VASC score: 5, previous LAA clipping  Anticoagulation history: Warfarin   Past Medical History:  Diagnosis Date  . ABUSE, ALCOHOL, IN REMISSION 04/08/2007  . ALLERGIC RHINITIS 09/29/2006  . Atrial flutter ([place]) 03/28/2010   a. s/p prior rfca;  b. recurrent paroxysmal flutter 04/2012;  c. pradaxa initiated 04/2012.  [name] BENIGN PROSTATIC HYPERTROPHY 09/29/2006  . BPPV (benign paroxysmal positional vertigo) 04/08/2007  . CAD (coronary artery disease)    a. LHC 2/17: EF 55-65%, LM 75, pLAD 75, oD1 75, oD2 65, D3 85, oLCx 99, oOM1 75, pRCA 25 >> CABG  . Carotid stenosis    a. Carotid US 2/17:  Bilateral ICA 1-39% ICA  . COLONIC POLYPS, HX OF 06/23/2007  . 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 Diverticulosis   . ERECTILE DYSFUNCTION 09/29/2006    . GLUCOSE INTOLERANCE 03/19/2010  . HYPERLIPIDEMIA 09/29/2006  . HYPERTENSION 09/29/2006  . Long term (current) use of anticoagulants 04/26/2010  . MELANOMA, MALIGNANT, SKIN NOS 09/29/2006   other skin cancers -no further melanoma  . OSTEOARTHROSIS NOS, OTHER [facility] SITE 09/29/2006  . Other specified forms of hearing loss 08/10/2009  . VENTRICULAR TACHYCARDIA 09/29/2006   Past Surgical History:  Procedure Laterality Date  . CARDIAC CATHETERIZATION N/A 03/12/2015   Procedure: Left Heart Cath and Coronary Angiography;  Surgeon: [name], MD;  Location: [place] [facility];  Service: Cardiovascular;  Laterality: N/A;  . CARDIOVERSION N/A 12/19/2015   Procedure: CARDIOVERSION;  Surgeon: [name], MD;  Location: [place];  Service: Cardiovascular;  Laterality: N/A;  . CATARACT EXTRACTION, BILATERAL Bilateral   . CLIPPING OF ATRIAL APPENDAGE N/A 03/14/2015   Procedure: CLIPPING OF ATRIAL APPENDAGE;  Surgeon: [name], MD;  Location: [place];  Service: Open Heart Surgery;  Laterality: N/A;  . COLONOSCOPY WITH PROPOFOL N/A 12/08/2014   Procedure: COLONOSCOPY WITH PROPOFOL;  Surgeon: [name], MD;  [facility];  Service: Endoscopy;  Laterality: N/A;  . CORONARY ARTERY BYPASS GRAFT N/A 03/14/2015   Procedure: CORONARY ARTERY BYPASS GRAFTING (CABG) x 4 (LIMA to LAD, SVG to DIAGONAL 2, SVG SEQUENTIALLY to OM1 and OM2);  Surgeon: [name], MD;  Location: [place];  Service: Open Heart Surgery;  Laterality: N/A;  . JOINT REPLACEMENT     Right knee  . Left knee arthroscopy    . Left rotato cuff    .  TEE WITHOUT CARDIOVERSION N/A 03/14/2015   Procedure: TRANSESOPHAGEAL ECHOCARDIOGRAM (TEE);  Surgeon: [name], MD;  Location: [place];  Service: Open Heart Surgery;  Laterality: N/A;  . TEE WITHOUT CARDIOVERSION N/A 12/19/2015   Procedure: TRANSESOPHAGEAL ECHOCARDIOGRAM (TEE);  Surgeon: [name], MD;  Location: [place];  Service: Cardiovascular;   Laterality: N/A;  . TONSILLECTOMY      Current Outpatient Prescriptions  Medication Sig Dispense Refill  . amiodarone (PACERONE) 200 MG tablet Take 200 mg by mouth 2 (two) times daily.    [name] atorvastatin (LIPITOR) 10 MG tablet Take 1 tablet (10 mg total) by mouth daily. 90 tablet 3  . fish oil-omega-3 fatty acids 1000 MG capsule Take 1 g by mouth daily.     [name] HYDROcodone-acetaminophen (NORCO/VICODIN) 5-325 MG tablet Take 1 tablet by mouth every 6 (six) hours as needed for moderate pain (body pain). (Patient taking differently: Take 1 tablet by mouth daily as needed for moderate pain (body pain). ) 90 tablet 0  . Multiple Vitamin (MULTIVITAMIN) tablet Take 1 tablet by mouth daily.      . psyllium (METAMUCIL) 58.6 % powder Take 1 packet by mouth 3 (three) times daily.    [name] warfarin (COUMADIN) 5 MG tablet Take as directed by Coumadin clinic. (Patient taking differently: Take 5-7.5 mg by mouth daily at 6 PM. Takes 5mg  all days except wed-takes 7.5mg ) 100 tablet 0   No current facility-administered medications for this encounter.     Allergies  Allergen Reactions  . Amiodarone Hcl Other (See Comments)    REACTION: intolerance Patient reports photosensitivity  . Ace Inhibitors Cough    Social History   Social History  . Marital status: Married    Spouse name: N/A  . Number of children: N/A  . Years of education: N/A   Occupational History  . Retired, Press photographer    Social History Main Topics  . Smoking status: Former Research scientist (life sciences)  . Smokeless tobacco: Never Used     Comment: Quit smoking and drinking 20 years ago  . Alcohol use No  . Drug use: No  . Sexual activity: Not on file   Other Topics Concern  . Not on file   Social History Narrative   Married, 3 children. Retired Press photographer. Lives in [place] with  wife and kids. He is retired from Press photographer.     Family History  Problem Relation Age of Onset  . Diabetes Brother   . Esophageal cancer Father   . Colon cancer Neg Hx   . Stomach cancer  Neg Hx     ROS- All systems are reviewed and negative except as per the HPI above.  Physical Exam: Vitals:   03/11/16 1145  BP: (!) 146/94  Pulse: (!) 134  Weight: 173 lb 3.2 oz (78.6 kg)  Height: 6' (1.829 m)    GEN- The patient is elderly appearing, alert and oriented x 3 today.   Head- normocephalic, atraumatic Eyes-  Sclera clear, conjunctiva pink Ears- hearing intact Oropharynx- clear Neck- supple  Lungs- Clear to ausculation bilaterally, normal work of breathing Heart- Tachycardic regular rate and rhythm  GI- soft, NT, ND, + BS Extremities- no clubbing, cyanosis, or edema MS- no significant deformity or atrophy Skin- no rash or lesion Psych- euthymic mood, full affect Neuro- strength and sensation are intact  Wt Readings from Last 3 Encounters:  03/11/16 173 lb 3.2 oz (78.6 kg)  02/08/16 176 lb 6.4 oz (80 kg)  01/23/16 175 lb (79.4 kg)  EKG today demonstrates atrial tachycardia, rate 134 TEE 12/2015 demonstrated EF 60-65%, no RWMA  Epic records are reviewed at length today  Assessment and Plan:  1. Persistent atrial fibrillation/atrial flutter/atrial tachycardia  He unfortunately has reverted to atrial tachycardia Will increase amiodarone to 200mg  bid and add back Metoprolol 50mg  twice daily INR's have been therapeutic for last month Continue Warfarin for CHADS2VASC of 5 BMET, CBC, TSH, LFT's today  Follow up next week, if still in AT, will need to consider repeat DCCV With multiple atrial arrhythmias and advanced age, he is not a candidate for ablation  2. CAD s/p CABG No recent ischemic symptoms Continue medical therapy  3.  HTN BP elevated today Add back Metoprolol as above  Follow up with AF clinic in 1 week   [name], NP 03/11/2016 11:57 AM

## 2016-03-11 NOTE — Patient Instructions (Signed)
Your physician has recommended you make the following change in your medication:  1)Increase Amiodarone to 200mg  twice a day 2)Restart Metoprolol 50mg  twice a day

## 2016-03-13 ENCOUNTER — Ambulatory Visit (INDEPENDENT_AMBULATORY_CARE_PROVIDER_SITE_OTHER): Payer: Medicare Other | Admitting: Nurse Practitioner

## 2016-03-13 ENCOUNTER — Encounter: Payer: Self-pay | Admitting: Nurse Practitioner

## 2016-03-13 VITALS — BP 130/94 | HR 120 | Temp 97.8°F | Ht 72.0 in | Wt 176.0 lb

## 2016-03-13 DIAGNOSIS — H6123 Impacted cerumen, bilateral: Secondary | ICD-10-CM

## 2016-03-13 DIAGNOSIS — G8929 Other chronic pain: Secondary | ICD-10-CM

## 2016-03-13 DIAGNOSIS — M545 Low back pain: Secondary | ICD-10-CM

## 2016-03-13 MED ORDER — HYDROCODONE-ACETAMINOPHEN 5-325 MG PO TABS: 1.0000 | ORAL_TABLET | Freq: Four times a day (QID) | ORAL | 0 refills | Status: DC | PRN

## 2016-03-13 NOTE — Patient Instructions (Signed)
Patient given hydrocodone prescription in office.

## 2016-03-13 NOTE — Progress Notes (Signed)
Subjective:  Patient ID: Jonathan Orozco, male    DOB: [birth year]  Age: 83 y.o. MRN: [id number]  CC: Follow-up (hydrocodon/anxiety med refill? not hearing too good (hearing aid on))   Ear Fullness   There is pain in both ears. This is a new problem. The current episode started in the past 7 days. The problem occurs constantly. The problem has been unchanged. There has been no fever. The patient is experiencing no pain. Associated symptoms include hearing loss. Pertinent negatives include no abdominal pain, coughing, ear discharge, headaches, neck pain, rhinorrhea or sore throat. He has tried nothing for the symptoms. His past medical history is significant for hearing loss.    Indication for chronic opioid: back pain Rate Pain (1-10 scale):5 Medication and dose: hydrocodone 5/325mg  # pills per month:90 Last prescribed: 12/07/2015 Last UDS date: not done Pain contract signed (Y/N): yes Date narcotic database last reviewed (include red flags): 03/13/2016, no red flags [facility] [place], [place]    . Outpatient Medications Prior to Visit  Medication Sig Dispense Refill  . amiodarone (PACERONE) 200 MG tablet Take 200 mg by mouth 2 (two) times daily.    [name] atorvastatin (LIPITOR) 10 MG tablet Take 1 tablet (10 mg total) by mouth daily. 90 tablet 3  . fish oil-omega-3 fatty acids 1000 MG capsule Take 1 g by mouth daily.     . metoprolol tartrate (LOPRESSOR) 50 MG tablet Take 1 tablet (50 mg total) by mouth 2 (two) times daily. 180 tablet 3  . Multiple Vitamin (MULTIVITAMIN) tablet Take 1 tablet by mouth daily.      . psyllium (METAMUCIL) 58.6 % powder Take 1 packet by mouth 3 (three) times daily.    [name] warfarin (COUMADIN) 5 MG tablet Take as directed by Coumadin clinic. (Patient taking differently: Take 5-7.5 mg by mouth daily at 6 PM. Takes 5mg  all days except wed-takes 7.5mg ) 100 tablet 0  . HYDROcodone-acetaminophen (NORCO/VICODIN) 5-325 MG tablet Take 1 tablet by mouth every 6  (six) hours as needed for moderate pain (body pain). (Patient taking differently: Take 1 tablet by mouth daily as needed for moderate pain (body pain). ) 90 tablet 0   No facility-administered medications prior to visit.     ROS See HPI  Objective:  BP (!) 130/94   Pulse (!) 120   Temp 97.8 F (36.6 C)   Ht 6' (1.829 m)   Wt 176 lb (79.8 kg)   SpO2 99%   BMI 23.87 kg/m   BP Readings from Last 3 Encounters:  03/13/16 (!) 130/94  03/11/16 (!) 146/94  02/08/16 (!) 162/90    Wt Readings from Last 3 Encounters:  03/13/16 176 lb (79.8 kg)  03/11/16 173 lb 3.2 oz (78.6 kg)  02/08/16 176 lb 6.4 oz (80 kg)    Physical Exam  Constitutional: He is oriented to person, place, and time. No distress.  HENT:  Right Ear: Tympanic membrane, external ear and ear canal normal.  Left Ear: Tympanic membrane, external ear and ear canal normal.  Procedure Note :     Procedure :  Ear irrigation (bilateral)  Indication:  Cerumen impaction  Risks, including pain, dizziness, eardrum perforation, bleeding, infection and others as well as benefits were explained to the patient in detail. Verbal consent was obtained and the patient agreed to proceed.    We used "The Elephant Ear Irrigation Device" filled with lukewarm water for irrigation. A large amount wax was recovered. Procedure has also required manual wax  removal with an ear loop.   Tolerated well. Complications: None.   Postprocedure instructions :  Call if problems.  Neck: Normal range of motion. Neck supple.  Cardiovascular: Normal rate, regular rhythm and normal heart sounds.   Pulmonary/Chest: Effort normal and breath sounds normal.  Musculoskeletal: He exhibits no edema.  Neurological: He is alert and oriented to person, place, and time.  Vitals reviewed.   Lab Results  Component Value Date   WBC 9.1 03/11/2016   HGB 16.0 03/11/2016   HCT 47.7 03/11/2016   PLT 286 03/11/2016   GLUCOSE 141 (H) 03/11/2016   CHOL 123  06/14/2015   TRIG 47.0 06/14/2015   HDL 50.50 06/14/2015   LDLDIRECT 159.2 09/29/2006   LDLCALC 63 06/14/2015   ALT 23 03/11/2016   AST 26 03/11/2016   NA 135 03/11/2016   K 4.6 03/11/2016   CL 99 (L) 03/11/2016   CREATININE 1.07 03/11/2016   BUN 9 03/11/2016   CO2 26 03/11/2016   TSH 1.019 03/11/2016   PSA 1.96 03/16/2009   INR 2.8 02/26/2016   HGBA1C 5.9 06/14/2015    No results found.  Assessment & Plan:   Jonathan Orozco was seen today for follow-up.  Diagnoses and all orders for this visit:  Chronic low back pain, unspecified back pain laterality, with sciatica presence unspecified -     HYDROcodone-acetaminophen (NORCO/VICODIN) 5-325 MG tablet; Take 1 tablet by mouth every 6 (six) hours as needed for moderate pain (body pain).  Bilateral impacted cerumen   I am having Jonathan Orozco maintain his multivitamin, fish oil-omega-3 fatty acids, atorvastatin, warfarin, amiodarone, psyllium, metoprolol, tamsulosin, and HYDROcodone-acetaminophen.  Meds ordered this encounter  Medications  . tamsulosin (FLOMAX) 0.4 MG CAPS capsule    Sig: Take 0.4 mg by mouth 1 day or 1 dose.  [name] HYDROcodone-acetaminophen (NORCO/VICODIN) 5-325 MG tablet    Sig: Take 1 tablet by mouth every 6 (six) hours as needed for moderate pain (body pain).    Dispense:  90 tablet    Refill:  0    Order Specific Question:   Supervising Provider    Answer:   Cassandria Anger [[id number]]    Follow-up: Return if symptoms worsen or fail to improve.  Wilfred Lacy, NP

## 2016-03-13 NOTE — Progress Notes (Signed)
Pre visit review using our clinic review tool, if applicable. No additional management support is needed unless otherwise documented below in the visit note. 

## 2016-03-18 ENCOUNTER — Encounter (HOSPITAL_COMMUNITY): Payer: Self-pay | Admitting: Nurse Practitioner

## 2016-03-18 ENCOUNTER — Ambulatory Visit (INDEPENDENT_AMBULATORY_CARE_PROVIDER_SITE_OTHER): Payer: Medicare Other | Admitting: *Deleted

## 2016-03-18 ENCOUNTER — Ambulatory Visit (HOSPITAL_COMMUNITY)
Admission: RE | Admit: 2016-03-18 | Discharge: 2016-03-18 | Disposition: A | Discharge status: Home / Self Care | Payer: Medicare Other | Source: Ambulatory Visit | Attending: Nurse Practitioner | Admitting: Nurse Practitioner

## 2016-03-18 VITALS — BP 136/86 | HR 123 | Ht 72.0 in | Wt 177.0 lb

## 2016-03-18 DIAGNOSIS — Z5181 Encounter for therapeutic drug level monitoring: Secondary | ICD-10-CM

## 2016-03-18 DIAGNOSIS — I10 Essential (primary) hypertension: Secondary | ICD-10-CM | POA: Diagnosis not present

## 2016-03-18 DIAGNOSIS — Z9889 Other specified postprocedural states: Secondary | ICD-10-CM | POA: Diagnosis not present

## 2016-03-18 DIAGNOSIS — Z5189 Encounter for other specified aftercare: Secondary | ICD-10-CM | POA: Diagnosis not present

## 2016-03-18 DIAGNOSIS — I4892 Unspecified atrial flutter: Secondary | ICD-10-CM

## 2016-03-18 DIAGNOSIS — Z79899 Other long term (current) drug therapy: Secondary | ICD-10-CM | POA: Diagnosis not present

## 2016-03-18 DIAGNOSIS — Z87891 Personal history of nicotine dependence: Secondary | ICD-10-CM | POA: Insufficient documentation

## 2016-03-18 LAB — POCT INR: INR: 3.5

## 2016-03-18 NOTE — Patient Instructions (Addendum)
Your physician has recommended you make the following change in your medication:  1)Decrease Amiodarone to 200mg  once a day   On Monday February 19th -- go to the coumadin clinic at [facility] to have your INR checked prior to going to the hospital for cardioversion  Cardioversion scheduled for Monday, February 19th  - Arrive at the [facility] and go to admitting at 9:30AM  -Do not eat or drink anything after midnight the night prior to your procedure.  - Take all your medication with a sip of water prior to arrival.  - You will not be able to drive home after your procedure.

## 2016-03-18 NOTE — Progress Notes (Signed)
Primary Care Physician: [name], MD Referring Physician: Dr. [name] is a 83 y.o. male with a h/o afib.He was in [facility] 11/14 thru 11-16 for treatment of aflutter with RVR. He has been on amiodarone years ago and had been in SR off ADD therapy.   He was started on IV Diltiazem with improvement in ventricular rate. INR was subtherapeutic on arrival.  He was seen by Dr [name] and underwent TEE guided cardioversion.  He unfortunately reverted back to atrial tachycardia later that day. Dr [name] advised reinitiating of amiodarone followed by repeat DCCV next week if he has persistent tachycardia. He did convert on his own and is feeling well. He is now on amiodarone 200 mg a day. His coumadin is followed by coumadin clinic checked last yesterday at 3.1. BP intially elevated by rechecked by me at 140/84.  He was seen back by [name], NP, 03/11/16 and had reverted back to AT with RVR the day before. He was reloaded on amiodarone 200 mg bid and metoprolol 50 mg bid was added. On return 2/13, he remains atach vrs aflutter at 123 bpm. It was discussed pursing DCCV ahd he is in agreement.   Today, he denies symptoms of  chest pain,  orthopnea, PND, lower extremity edema, dizziness, presyncope, syncope, or neurologic sequela. Positive for palpitations and mild shortness of breath. The patient is tolerating medications without difficulties and is otherwise without complaint today.   Past Medical History:  Diagnosis Date  . ABUSE, ALCOHOL, IN REMISSION 04/08/2007  . ALLERGIC RHINITIS 09/29/2006  . Atrial flutter ([place]) 03/28/2010   a. s/p prior rfca;  b. recurrent paroxysmal flutter 04/2012;  c. pradaxa initiated 04/2012.  [name] BENIGN PROSTATIC HYPERTROPHY 09/29/2006  . BPPV (benign paroxysmal positional vertigo) 04/08/2007  . CAD (coronary artery disease)    a. LHC 2/17: EF 55-65%, LM 75, pLAD 75, oD1 75, oD2 65, D3 85, oLCx 99, oOM1 75, pRCA 25 >> CABG  . Carotid stenosis    a. Carotid US 2/17:   Bilateral ICA 1-39% ICA  . COLONIC POLYPS, HX OF 06/23/2007  . 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 Diverticulosis   . ERECTILE DYSFUNCTION 09/29/2006  . GLUCOSE INTOLERANCE 03/19/2010  . HYPERLIPIDEMIA 09/29/2006  . HYPERTENSION 09/29/2006  . Long term (current) use of anticoagulants 04/26/2010  . MELANOMA, MALIGNANT, SKIN NOS 09/29/2006   other skin cancers -no further melanoma  . OSTEOARTHROSIS NOS, OTHER [facility] SITE 09/29/2006  . Other specified forms of hearing loss 08/10/2009  . VENTRICULAR TACHYCARDIA 09/29/2006   Past Surgical History:  Procedure Laterality Date  . CARDIAC CATHETERIZATION N/A 03/12/2015   Procedure: Left Heart Cath and Coronary Angiography;  Surgeon: [name], MD;  Location: [place] [facility];  Service: Cardiovascular;  Laterality: N/A;  . CARDIOVERSION N/A 12/19/2015   Procedure: CARDIOVERSION;  Surgeon: [name], MD;  Location: [place];  Service: Cardiovascular;  Laterality: N/A;  . CATARACT EXTRACTION, BILATERAL Bilateral   . CLIPPING OF ATRIAL APPENDAGE N/A 03/14/2015   Procedure: CLIPPING OF ATRIAL APPENDAGE;  Surgeon: [name], MD;  Location: [place];  Service: Open Heart Surgery;  Laterality: N/A;  . COLONOSCOPY WITH PROPOFOL N/A 12/08/2014   Procedure: COLONOSCOPY WITH PROPOFOL;  Surgeon: [name], MD;  [facility];  Service: Endoscopy;  Laterality: N/A;  . CORONARY ARTERY BYPASS GRAFT N/A 03/14/2015   Procedure: CORONARY ARTERY BYPASS GRAFTING (CABG) x 4 (LIMA to LAD, SVG to DIAGONAL 2, SVG SEQUENTIALLY  to OM1 and OM2);  Surgeon: [name], MD;  Location: [place];  Service: Open Heart Surgery;  Laterality: N/A;  . JOINT REPLACEMENT     Right knee  . Left knee arthroscopy    . Left rotato cuff    . TEE WITHOUT CARDIOVERSION N/A 03/14/2015   Procedure: TRANSESOPHAGEAL ECHOCARDIOGRAM (TEE);  Surgeon: [name], MD;  Location: [place];  Service: Open Heart Surgery;  Laterality: N/A;  . TEE WITHOUT  CARDIOVERSION N/A 12/19/2015   Procedure: TRANSESOPHAGEAL ECHOCARDIOGRAM (TEE);  Surgeon: [name], MD;  Location: [place];  Service: Cardiovascular;  Laterality: N/A;  . TONSILLECTOMY      Current Outpatient Prescriptions  Medication Sig Dispense Refill  . amiodarone (PACERONE) 200 MG tablet Take 200 mg by mouth 2 (two) times daily.    [name] atorvastatin (LIPITOR) 10 MG tablet Take 1 tablet (10 mg total) by mouth daily. 90 tablet 3  . fish oil-omega-3 fatty acids 1000 MG capsule Take 1 g by mouth daily.     [name] HYDROcodone-acetaminophen (NORCO/VICODIN) 5-325 MG tablet Take 1 tablet by mouth every 6 (six) hours as needed for moderate pain (body pain). 90 tablet 0  . metoprolol tartrate (LOPRESSOR) 50 MG tablet Take 1 tablet (50 mg total) by mouth 2 (two) times daily. 180 tablet 3  . Multiple Vitamin (MULTIVITAMIN) tablet Take 1 tablet by mouth daily.      . psyllium (METAMUCIL) 58.6 % powder Take 1 packet by mouth 3 (three) times daily.    . tamsulosin (FLOMAX) 0.4 MG CAPS capsule Take 0.4 mg by mouth 1 day or 1 dose.    . warfarin (COUMADIN) 5 MG tablet Take as directed by Coumadin clinic. (Patient taking differently: Take 5-7.5 mg by mouth daily at 6 PM. Takes 5mg  all days except wed-takes 7.5mg ) 100 tablet 0   No current facility-administered medications for this encounter.     Allergies  Allergen Reactions  . Amiodarone Hcl Other (See Comments)    REACTION: intolerance Patient reports photosensitivity  . Ace Inhibitors Cough    Social History   Social History  . Marital status: Married    Spouse name: N/A  . Number of children: N/A  . Years of education: N/A   Occupational History  . Retired, Press photographer    Social History Main Topics  . Smoking status: Former Research scientist (life sciences)  . Smokeless tobacco: Never Used     Comment: Quit smoking and drinking 20 years ago  . Alcohol use No  . Drug use: No  . Sexual activity: Not on file   Other Topics Concern  . Not on file   Social  History Narrative   Married, 3 children. Retired Press photographer. Lives in [place] with  wife and kids. He is retired from Press photographer.     Family History  Problem Relation Age of Onset  . Diabetes Brother   . Esophageal cancer Father   . Colon cancer Neg Hx   . Stomach cancer Neg Hx     ROS- All systems are reviewed and negative except as per the HPI above  Physical Exam: Vitals:   03/18/16 1036  BP: 136/86  Pulse: (!) 123  Weight: 177 lb (80.3 kg)  Height: 6' (1.829 m)   Wt Readings from Last 3 Encounters:  03/18/16 177 lb (80.3 kg)  03/13/16 176 lb (79.8 kg)  03/11/16 173 lb 3.2 oz (78.6 kg)    Labs: Lab Results  Component Value Date   NA 135 03/11/2016  K 4.6 03/11/2016   CL 99 (L) 03/11/2016   CO2 26 03/11/2016   GLUCOSE 141 (H) 03/11/2016   BUN 9 03/11/2016   CREATININE 1.07 03/11/2016   CALCIUM 9.4 03/11/2016   MG 2.1 12/18/2015   Lab Results  Component Value Date   INR 3.5 03/18/2016   Lab Results  Component Value Date   CHOL 123 06/14/2015   HDL 50.50 06/14/2015   LDLCALC 63 06/14/2015   TRIG 47.0 06/14/2015     GEN- The patient is well appearing, alert and oriented x 3 today.   Head- normocephalic, atraumatic Eyes-  Sclera clear, conjunctiva pink Ears- hearing intact Oropharynx- clear Neck- supple, no JVP Lymph- no cervical lymphadenopathy Lungs- Clear to ausculation bilaterally, normal work of breathing Heart- Regular rate and rhythm, no murmurs, rubs or gallops, PMI not laterally displaced GI- soft, NT, ND, + BS Extremities- no clubbing, cyanosis, or edema MS- no significant deformity or atrophy Skin- no rash or lesion Psych- euthymic mood, full affect Neuro- strength and sensation are intact  EKG- Atach vrs flutter at 123 bpm, LAD, RBBB, PR int 56 ms, qrs int  Epic records reviewed    Assessment and Plan:  1. Persistent atrial tachyarrhythmias, atach/aflutter Reduce amiodarone back to  200 mg a day Will schedule for DCCV 2/19, INR's have been  therapeutic for many checks but he has not had 4 weekly consecutive therapeutic readings He is pending INR today and would rather have TEE guided cardioversion than put the cardioversion off x 3-4 weeks, since he is wanting to do some traveling INR day of cardioversion Labs checked last 2/6, bmet/cbc ok for cardioversion Continue metoprolol He is well aware of risk vrs benefit of DCCV since he has had many over the years  2. HTN Stable  F/u in one week after procedure   Jonathan Orozco, [facility] [address] [place], [place] [address] [phone]

## 2016-03-24 ENCOUNTER — Encounter (HOSPITAL_COMMUNITY): Payer: Self-pay

## 2016-03-24 ENCOUNTER — Ambulatory Visit (INDEPENDENT_AMBULATORY_CARE_PROVIDER_SITE_OTHER): Payer: Medicare Other | Admitting: *Deleted

## 2016-03-24 ENCOUNTER — Ambulatory Visit (HOSPITAL_COMMUNITY): Payer: Medicare Other | Admitting: Anesthesiology

## 2016-03-24 ENCOUNTER — Ambulatory Visit (HOSPITAL_COMMUNITY)
Admission: RE | Admit: 2016-03-24 | Discharge: 2016-03-24 | Disposition: A | Discharge status: Home / Self Care | Payer: Medicare Other | Source: Ambulatory Visit | Attending: Cardiology | Admitting: Cardiology

## 2016-03-24 ENCOUNTER — Ambulatory Visit (HOSPITAL_BASED_OUTPATIENT_CLINIC_OR_DEPARTMENT_OTHER): Payer: Medicare Other

## 2016-03-24 ENCOUNTER — Encounter (HOSPITAL_COMMUNITY)
Admission: RE | Disposition: A | Discharge status: Home / Self Care | Payer: Self-pay | Source: Ambulatory Visit | Attending: Cardiology

## 2016-03-24 DIAGNOSIS — Z79899 Other long term (current) drug therapy: Secondary | ICD-10-CM | POA: Diagnosis not present

## 2016-03-24 DIAGNOSIS — I10 Essential (primary) hypertension: Secondary | ICD-10-CM | POA: Diagnosis not present

## 2016-03-24 DIAGNOSIS — Z5181 Encounter for therapeutic drug level monitoring: Secondary | ICD-10-CM | POA: Diagnosis not present

## 2016-03-24 DIAGNOSIS — Z85820 Personal history of malignant melanoma of skin: Secondary | ICD-10-CM | POA: Insufficient documentation

## 2016-03-24 DIAGNOSIS — I739 Peripheral vascular disease, unspecified: Secondary | ICD-10-CM | POA: Insufficient documentation

## 2016-03-24 DIAGNOSIS — Z87891 Personal history of nicotine dependence: Secondary | ICD-10-CM | POA: Insufficient documentation

## 2016-03-24 DIAGNOSIS — Z7901 Long term (current) use of anticoagulants: Secondary | ICD-10-CM | POA: Insufficient documentation

## 2016-03-24 DIAGNOSIS — N400 Enlarged prostate without lower urinary tract symptoms: Secondary | ICD-10-CM | POA: Diagnosis not present

## 2016-03-24 DIAGNOSIS — Z951 Presence of aortocoronary bypass graft: Secondary | ICD-10-CM | POA: Insufficient documentation

## 2016-03-24 DIAGNOSIS — E785 Hyperlipidemia, unspecified: Secondary | ICD-10-CM | POA: Insufficient documentation

## 2016-03-24 DIAGNOSIS — I4892 Unspecified atrial flutter: Secondary | ICD-10-CM | POA: Insufficient documentation

## 2016-03-24 DIAGNOSIS — I483 Typical atrial flutter: Secondary | ICD-10-CM

## 2016-03-24 DIAGNOSIS — I2510 Atherosclerotic heart disease of native coronary artery without angina pectoris: Secondary | ICD-10-CM | POA: Insufficient documentation

## 2016-03-24 DIAGNOSIS — I340 Nonrheumatic mitral (valve) insufficiency: Secondary | ICD-10-CM

## 2016-03-24 HISTORY — PX: TEE WITHOUT CARDIOVERSION: SHX5443

## 2016-03-24 HISTORY — PX: CARDIOVERSION: SHX1299

## 2016-03-24 LAB — POCT INR: INR: 2.7

## 2016-03-24 SURGERY — CARDIOVERSION
Anesthesia: General

## 2016-03-24 MED ORDER — BUTAMBEN-TETRACAINE-BENZOCAINE 2-2-14 % EX AERO
INHALATION_SPRAY | CUTANEOUS | Status: DC | PRN
  Administered 2016-03-24: 2 via TOPICAL

## 2016-03-24 MED ORDER — EPHEDRINE SULFATE 50 MG/ML IJ SOLN
INTRAMUSCULAR | Status: DC | PRN
  Administered 2016-03-24: 15 mg via INTRAVENOUS

## 2016-03-24 MED ORDER — SODIUM CHLORIDE 0.9 % IV SOLN: INTRAVENOUS | Status: DC

## 2016-03-24 MED ORDER — LIDOCAINE HCL (CARDIAC) 20 MG/ML IV SOLN
INTRAVENOUS | Status: DC | PRN
  Administered 2016-03-24: 100 mg via INTRATRACHEAL

## 2016-03-24 MED ORDER — PROPOFOL 10 MG/ML IV BOLUS
INTRAVENOUS | Status: DC | PRN
  Administered 2016-03-24: 40 mg via INTRAVENOUS

## 2016-03-24 MED ORDER — PROPOFOL 500 MG/50ML IV EMUL
INTRAVENOUS | Status: DC | PRN
  Administered 2016-03-24: 100 ug/kg/min via INTRAVENOUS

## 2016-03-24 MED ORDER — LACTATED RINGERS IV SOLN
INTRAVENOUS | Status: DC
  Administered 2016-03-24: 1000 mL via INTRAVENOUS

## 2016-03-24 MED ORDER — LACTATED RINGERS IV SOLN
INTRAVENOUS | Status: DC | PRN
  Administered 2016-03-24: 11:00:00 via INTRAVENOUS

## 2016-03-24 MED ORDER — PHENYLEPHRINE HCL 10 MG/ML IJ SOLN
INTRAMUSCULAR | Status: DC | PRN
  Administered 2016-03-24: 160 ug via INTRAVENOUS
  Administered 2016-03-24: 200 ug via INTRAVENOUS

## 2016-03-24 NOTE — H&P (View-Only) (Signed)
Primary Care Physician: [name], MD Referring Physician: Dr. [name] is a 83 y.o. male with a h/o afib.He was in [facility] 11/14 thru 11-16 for treatment of aflutter with RVR. He has been on amiodarone years ago and had been in SR off ADD therapy.   He was started on IV Diltiazem with improvement in ventricular rate. INR was subtherapeutic on arrival.  He was seen by Dr [name] and underwent TEE guided cardioversion.  He unfortunately reverted back to atrial tachycardia later that day. Dr [name] advised reinitiating of amiodarone followed by repeat DCCV next week if he has persistent tachycardia. He did convert on his own and is feeling well. He is now on amiodarone 200 mg a day. His coumadin is followed by coumadin clinic checked last yesterday at 3.1. BP intially elevated by rechecked by me at 140/84.  He was seen back by [name], NP, 03/11/16 and had reverted back to AT with RVR the day before. He was reloaded on amiodarone 200 mg bid and metoprolol 50 mg bid was added. On return 2/13, he remains atach vrs aflutter at 123 bpm. It was discussed pursing DCCV ahd he is in agreement.   Today, he denies symptoms of  chest pain,  orthopnea, PND, lower extremity edema, dizziness, presyncope, syncope, or neurologic sequela. Positive for palpitations and mild shortness of breath. The patient is tolerating medications without difficulties and is otherwise without complaint today.   Past Medical History:  Diagnosis Date  . ABUSE, ALCOHOL, IN REMISSION 04/08/2007  . ALLERGIC RHINITIS 09/29/2006  . Atrial flutter ([place]) 03/28/2010   a. s/p prior rfca;  b. recurrent paroxysmal flutter 04/2012;  c. pradaxa initiated 04/2012.  [name] BENIGN PROSTATIC HYPERTROPHY 09/29/2006  . BPPV (benign paroxysmal positional vertigo) 04/08/2007  . CAD (coronary artery disease)    a. LHC 2/17: EF 55-65%, LM 75, pLAD 75, oD1 75, oD2 65, D3 85, oLCx 99, oOM1 75, pRCA 25 >> CABG  . Carotid stenosis    a. Carotid US 2/17:   Bilateral ICA 1-39% ICA  . COLONIC POLYPS, HX OF 06/23/2007  . 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 Diverticulosis   . ERECTILE DYSFUNCTION 09/29/2006  . GLUCOSE INTOLERANCE 03/19/2010  . HYPERLIPIDEMIA 09/29/2006  . HYPERTENSION 09/29/2006  . Long term (current) use of anticoagulants 04/26/2010  . MELANOMA, MALIGNANT, SKIN NOS 09/29/2006   other skin cancers -no further melanoma  . OSTEOARTHROSIS NOS, OTHER [facility] SITE 09/29/2006  . Other specified forms of hearing loss 08/10/2009  . VENTRICULAR TACHYCARDIA 09/29/2006   Past Surgical History:  Procedure Laterality Date  . CARDIAC CATHETERIZATION N/A 03/12/2015   Procedure: Left Heart Cath and Coronary Angiography;  Surgeon: [name], MD;  Location: [place] [facility];  Service: Cardiovascular;  Laterality: N/A;  . CARDIOVERSION N/A 12/19/2015   Procedure: CARDIOVERSION;  Surgeon: [name], MD;  Location: [place];  Service: Cardiovascular;  Laterality: N/A;  . CATARACT EXTRACTION, BILATERAL Bilateral   . CLIPPING OF ATRIAL APPENDAGE N/A 03/14/2015   Procedure: CLIPPING OF ATRIAL APPENDAGE;  Surgeon: [name], MD;  Location: [place];  Service: Open Heart Surgery;  Laterality: N/A;  . COLONOSCOPY WITH PROPOFOL N/A 12/08/2014   Procedure: COLONOSCOPY WITH PROPOFOL;  Surgeon: [name], MD;  Location: [facility];  Service: Endoscopy;  Laterality: N/A;  . CORONARY ARTERY BYPASS GRAFT N/A 03/14/2015   Procedure: CORONARY ARTERY BYPASS GRAFTING (CABG) x 4 (LIMA to LAD, SVG to DIAGONAL 2, SVG SEQUENTIALLY  to OM1 and OM2);  Surgeon: [name], MD;  Location: [place];  Service: Open Heart Surgery;  Laterality: N/A;  . JOINT REPLACEMENT     Right knee  . Left knee arthroscopy    . Left rotato cuff    . TEE WITHOUT CARDIOVERSION N/A 03/14/2015   Procedure: TRANSESOPHAGEAL ECHOCARDIOGRAM (TEE);  Surgeon: [name], MD;  Location: [place];  Service: Open Heart Surgery;  Laterality: N/A;  . TEE WITHOUT  CARDIOVERSION N/A 12/19/2015   Procedure: TRANSESOPHAGEAL ECHOCARDIOGRAM (TEE);  Surgeon: [name], MD;  Location: [place];  Service: Cardiovascular;  Laterality: N/A;  . TONSILLECTOMY      Current Outpatient Prescriptions  Medication Sig Dispense Refill  . amiodarone (PACERONE) 200 MG tablet Take 200 mg by mouth 2 (two) times daily.    [name] atorvastatin (LIPITOR) 10 MG tablet Take 1 tablet (10 mg total) by mouth daily. 90 tablet 3  . fish oil-omega-3 fatty acids 1000 MG capsule Take 1 g by mouth daily.     [name] HYDROcodone-acetaminophen (NORCO/VICODIN) 5-325 MG tablet Take 1 tablet by mouth every 6 (six) hours as needed for moderate pain (body pain). 90 tablet 0  . metoprolol tartrate (LOPRESSOR) 50 MG tablet Take 1 tablet (50 mg total) by mouth 2 (two) times daily. 180 tablet 3  . Multiple Vitamin (MULTIVITAMIN) tablet Take 1 tablet by mouth daily.      . psyllium (METAMUCIL) 58.6 % powder Take 1 packet by mouth 3 (three) times daily.    . tamsulosin (FLOMAX) 0.4 MG CAPS capsule Take 0.4 mg by mouth 1 day or 1 dose.    . warfarin (COUMADIN) 5 MG tablet Take as directed by Coumadin clinic. (Patient taking differently: Take 5-7.5 mg by mouth daily at 6 PM. Takes 5mg  all days except wed-takes 7.5mg ) 100 tablet 0   No current facility-administered medications for this encounter.     Allergies  Allergen Reactions  . Amiodarone Hcl Other (See Comments)    REACTION: intolerance Patient reports photosensitivity  . Ace Inhibitors Cough    Social History   Social History  . Marital status: Married    Spouse name: N/A  . Number of children: N/A  . Years of education: N/A   Occupational History  . Retired, Press photographer    Social History Main Topics  . Smoking status: Former Research scientist (life sciences)  . Smokeless tobacco: Never Used     Comment: Quit smoking and drinking 20 years ago  . Alcohol use No  . Drug use: No  . Sexual activity: Not on file   Other Topics Concern  . Not on file   Social  History Narrative   Married, 3 children. Retired Press photographer. Lives in [place] with  wife and kids. He is retired from Press photographer.     Family History  Problem Relation Age of Onset  . Diabetes Brother   . Esophageal cancer Father   . Colon cancer Neg Hx   . Stomach cancer Neg Hx     ROS- All systems are reviewed and negative except as per the HPI above  Physical Exam: Vitals:   03/18/16 1036  BP: 136/86  Pulse: (!) 123  Weight: 177 lb (80.3 kg)  Height: 6' (1.829 m)   Wt Readings from Last 3 Encounters:  03/18/16 177 lb (80.3 kg)  03/13/16 176 lb (79.8 kg)  03/11/16 173 lb 3.2 oz (78.6 kg)    Labs: Lab Results  Component Value Date   NA 135 03/11/2016  K 4.6 03/11/2016   CL 99 (L) 03/11/2016   CO2 26 03/11/2016   GLUCOSE 141 (H) 03/11/2016   BUN 9 03/11/2016   CREATININE 1.07 03/11/2016   CALCIUM 9.4 03/11/2016   MG 2.1 12/18/2015   Lab Results  Component Value Date   INR 3.5 03/18/2016   Lab Results  Component Value Date   CHOL 123 06/14/2015   HDL 50.50 06/14/2015   LDLCALC 63 06/14/2015   TRIG 47.0 06/14/2015     GEN- The patient is well appearing, alert and oriented x 3 today.   Head- normocephalic, atraumatic Eyes-  Sclera clear, conjunctiva pink Ears- hearing intact Oropharynx- clear Neck- supple, no JVP Lymph- no cervical lymphadenopathy Lungs- Clear to ausculation bilaterally, normal work of breathing Heart- Regular rate and rhythm, no murmurs, rubs or gallops, PMI not laterally displaced GI- soft, NT, ND, + BS Extremities- no clubbing, cyanosis, or edema MS- no significant deformity or atrophy Skin- no rash or lesion Psych- euthymic mood, full affect Neuro- strength and sensation are intact  EKG- Atach vrs flutter at 123 bpm, LAD, RBBB, PR int 56 ms, qrs int  Epic records reviewed    Assessment and Plan:  1. Persistent atrial tachyarrhythmias, atach/aflutter Reduce amiodarone back to  200 mg a day Will schedule for DCCV 2/19, INR's have been  therapeutic for many checks but he has not had 4 weekly consecutive therapeutic readings He is pending INR today and would rather have TEE guided cardioversion than put the cardioversion off x 3-4 weeks, since he is wanting to do some traveling INR day of cardioversion Labs checked last 2/6, bmet/cbc ok for cardioversion Continue metoprolol He is well aware of risk vrs benefit of DCCV since he has had many over the years  2. HTN Stable  F/u in one week after procedure   Butch Penny C. Aileena Iglesia, [facility] [address] [place], [place] [address] [phone]

## 2016-03-24 NOTE — Discharge Instructions (Signed)
TEE ° °YOU HAD AN CARDIAC PROCEDURE TODAY: Refer to the procedure report and other information in the discharge instructions given to you for any specific questions about what was found during the examination. If this information does not answer your questions, please call [facility] office at [phone] to clarify.  ° °DIET: Your first meal following the procedure should be a light meal and then it is ok to progress to your normal diet. A half-sandwich or bowl of soup is an example of a good first meal. Heavy or fried foods are harder to digest and may make you feel nauseous or bloated. Drink plenty of fluids but you should avoid alcoholic beverages for 24 hours. If you had a esophageal dilation, please see attached instructions for diet.  ° °ACTIVITY: Your care partner should take you home directly after the procedure. You should plan to take it easy, moving slowly for the rest of the day. You can resume normal activity the day after the procedure however YOU SHOULD NOT DRIVE, use power tools, machinery or perform tasks that involve climbing or major physical exertion for 24 hours (because of the sedation medicines used during the test).  ° °SYMPTOMS TO REPORT IMMEDIATELY: °A cardiologist can be reached at any hour. Please call [phone] for any of the following symptoms:  °Vomiting of blood or coffee ground material  °New, significant abdominal pain  °New, significant chest pain or pain under the shoulder blades  °Painful or persistently difficult swallowing  °New shortness of breath  °Black, tarry-looking or red, bloody stools ° °FOLLOW UP:  °Please also call with any specific questions about appointments or follow up tests. ° ° °Electrical Cardioversion, Care After °This sheet gives you information about how to care for yourself after your procedure. Your health care provider may also give you more specific instructions. If you have problems or questions, contact your health care provider. °What can I  expect after the procedure? °After the procedure, it is common to have: °· Some redness on the skin where the shocks were given. °Follow these instructions at home: °· Do not drive for 24 hours if you were given a medicine to help you relax (sedative). °· Take over-the-counter and prescription medicines only as told by your health care provider. °· Ask your health care provider how to check your pulse. Check it often. °· Rest for 48 hours after the procedure or as told by your health care provider. °· Avoid or limit your caffeine use as told by your health care provider. °Contact a health care provider if: °· You feel like your heart is beating too quickly or your pulse is not regular. °· You have a serious muscle cramp that does not go away. °Get help right away if: °· You have discomfort in your chest. °· You are dizzy or you feel faint. °· You have trouble breathing or you are short of breath. °· Your speech is slurred. °· You have trouble moving an arm or leg on one side of your body. °· Your fingers or toes turn cold or blue. °This information is not intended to replace advice given to you by your health care provider. Make sure you discuss any questions you have with your health care provider. °Document Released: 11/10/2012 Document Revised: 08/24/2015 Document Reviewed: 07/27/2015 °Elsevier Interactive Patient Education © 2017 [employer] Inc[employer] ° °

## 2016-03-24 NOTE — Anesthesia Procedure Notes (Signed)
Procedure Name: MAC Date/Time: 03/24/2016 11:25 AM Performed by: Lance Coon Pre-anesthesia Checklist: Patient identified, Emergency Drugs available, Suction available, Patient being monitored and Timeout performed Patient Re-evaluated:Patient Re-evaluated prior to inductionOxygen Delivery Method: Nasal cannula

## 2016-03-24 NOTE — Progress Notes (Deleted)
*  PRELIMINARY RESULTS* 2D Echocardiogram has been performed.  Jonathan Orozco 03/24/2016, 12:01 PM

## 2016-03-24 NOTE — Progress Notes (Signed)
  Echocardiogram Echocardiogram Transesophageal has been performed.  Jonathan Orozco 03/24/2016, 12:03 PM

## 2016-03-24 NOTE — Anesthesia Postprocedure Evaluation (Signed)
Anesthesia Post Note  Patient: Jonathan Orozco  Procedure(s) Performed: Procedure(s) (LRB): CARDIOVERSION (N/A) TRANSESOPHAGEAL ECHOCARDIOGRAM (TEE) (N/A)  Patient location during evaluation: PACU Anesthesia Type: General Level of consciousness: awake and alert Pain management: pain level controlled Vital Signs Assessment: post-procedure vital signs reviewed and stable Respiratory status: spontaneous breathing, nonlabored ventilation and respiratory function stable Cardiovascular status: blood pressure returned to baseline and stable Postop Assessment: no signs of nausea or vomiting Anesthetic complications: no       Last Vitals:  Vitals:   03/24/16 1210 03/24/16 1220  BP: 94/62 102/67  Pulse: (!) 56 (!) 56  Resp: 16 11  Temp:      Last Pain:  Vitals:   03/24/16 1150  TempSrc: Oral                 [name],W. EDMOND

## 2016-03-24 NOTE — Progress Notes (Signed)
Patient was hypertensive prior to procedure. He states that it was all nerves about having something done. Following procedure, patient's blood pressure is more of his normal according to him and his wife; BP at 102/67 prior to discharge. Patient states that he is feeling great.

## 2016-03-24 NOTE — CV Procedure (Addendum)
     Transesophageal Echocardiogram Note  BARETTA ZIPKIN [id number] January 18, 1929  Procedure: Transesophageal Echocardiogram Indications: atrial flutter  Procedure Details Consent: Obtained Time Out: Verified patient identification, verified procedure, site/side was marked, verified correct patient position, special equipment/implants available, Radiology Safety Procedures followed,  medications/allergies/relevent history reviewed, required imaging and test results available.  Performed  Medications:  IV lidocaine and propofol administered by anesthesia staff  Left Ventrical:  LVEF 55-60%  Mitral Valve: Mild to moderate MR  Aortic Valve: normal  Tricuspid Valve: mild TR  Pulmonic Valve: normal  Left Atrium/ Left atrial appendage: s/p left atrial appendage clipping  Atrial septum: no ASD  Aorta: severe non-mobile plaque   Complications: No apparent complications Patient did tolerate procedure well.  Ena Dawley, MD, [facility] 03/24/2016, 11:46 AM   Cardioversion Note  TOMIE BIRTCHER [id number] 29-Oct-1928  Procedure: DC Cardioversion Indications: atrial flutter  Procedure Details Consent: Obtained Time Out: Verified patient identification, verified procedure, site/side was marked, verified correct patient position, special equipment/implants available, Radiology Safety Procedures followed,  medications/allergies/relevent history reviewed, required imaging and test results available.  Performed  The patient has been on adequate anticoagulation.  The patient received IV lidocaine and propofol administered by anesthesia staff for sedation.  Synchronous cardioversion was performed at 120  joules.  The cardioversion was successful.    Complications: No apparent complications Patient did tolerate procedure well.   Ena Dawley, MD, [facility] 03/24/2016, 11:46 AM

## 2016-03-24 NOTE — Interval H&P Note (Signed)
History and Physical Interval Note:  03/24/2016 11:22 AM  Jonathan Orozco  has presented today for surgery, with the diagnosis of afib  The various methods of treatment have been discussed with the patient and family. After consideration of risks, benefits and other options for treatment, the patient has consented to  Procedure(s): CARDIOVERSION (N/A) TRANSESOPHAGEAL ECHOCARDIOGRAM (TEE) (N/A) as a surgical intervention .  The patient's history has been reviewed, patient examined, no change in status, stable for surgery.  I have reviewed the patient's chart and labs.  Questions were answered to the patient's satisfaction.     Ena Dawley

## 2016-03-24 NOTE — Anesthesia Preprocedure Evaluation (Addendum)
Anesthesia Evaluation  Patient identified by MRN, date of birth, ID band Patient awake    Reviewed: Allergy & Precautions, H&P , NPO status , Patient's Chart, lab work & pertinent test results, reviewed documented beta blocker date and time   Airway Mallampati: II  TM Distance: >3 FB Neck ROM: Full    Dental no notable dental hx. (+) Upper Dentures, Edentulous Lower, Dental Advisory Given   Pulmonary neg pulmonary ROS, former smoker,    Pulmonary exam normal breath sounds clear to auscultation       Cardiovascular hypertension, Pt. on medications and Pt. on home beta blockers + CAD, + CABG and + Peripheral Vascular Disease   Rhythm:Regular Rate:Tachycardia     Neuro/Psych Anxiety negative neurological ROS  negative psych ROS   GI/Hepatic negative GI ROS, Neg liver ROS,   Endo/Other  negative endocrine ROS  Renal/GU negative Renal ROS  negative genitourinary   Musculoskeletal  (+) Arthritis , Osteoarthritis,    Abdominal   Peds  Hematology negative hematology ROS (+)   Anesthesia Other Findings   Reproductive/Obstetrics negative OB ROS                            Anesthesia Physical Anesthesia Plan  ASA: III  Anesthesia Plan: General   Post-op Pain Management:    Induction: Intravenous  Airway Management Planned: Nasal Cannula  Additional Equipment:   Intra-op Plan:   Post-operative Plan:   Informed Consent: I have reviewed the patients History and Physical, chart, labs and discussed the procedure including the risks, benefits and alternatives for the proposed anesthesia with the patient or authorized representative who has indicated his/her understanding and acceptance.   Dental advisory given  Plan Discussed with: CRNA  Anesthesia Plan Comments:         Anesthesia Quick Evaluation

## 2016-03-24 NOTE — Transfer of Care (Signed)
Immediate Anesthesia Transfer of Care Note  Patient: Jonathan Orozco  Procedure(s) Performed: Procedure(s): CARDIOVERSION (N/A) TRANSESOPHAGEAL ECHOCARDIOGRAM (TEE) (N/A)  Patient Location: Endoscopy Unit  Anesthesia Type:MAC  Level of Consciousness: awake, alert , oriented and patient cooperative  Airway & Oxygen Therapy: Patient Spontanous Breathing  Post-op Assessment: Report given to RN and Post -op Vital signs reviewed and stable  Post vital signs: Reviewed and stable  Last Vitals:  Vitals:   03/24/16 0952  BP: (!) 142/108  Pulse: (!) 125  Resp: 20  Temp: 36.4 C    Last Pain:  Vitals:   03/24/16 0952  TempSrc: Oral         Complications: No apparent anesthesia complications

## 2016-03-25 ENCOUNTER — Telehealth (HOSPITAL_COMMUNITY): Payer: Self-pay | Admitting: *Deleted

## 2016-03-25 NOTE — Telephone Encounter (Signed)
Pts spouse called about pts HR since cardioversion being between 43-46.  Pt has changed amiodarone to QD, and was instructed by Roderic Palau, NP today to cut metoprolol dose in half.  Pt spouse understood, will keep f/u with our clinic and cut metoprolol to 25 mg bid

## 2016-03-26 ENCOUNTER — Telehealth (HOSPITAL_COMMUNITY): Payer: Self-pay | Admitting: *Deleted

## 2016-03-26 NOTE — Telephone Encounter (Signed)
Wife called back in today stating pt has went back into afib HR from 80-120s. Instructed wife to increase metoprolol back to 50mg  twice a day and let us know heart rates in the morning. Will bring in if needed tomorrow.

## 2016-03-28 ENCOUNTER — Telehealth (HOSPITAL_COMMUNITY): Payer: Self-pay | Admitting: *Deleted

## 2016-03-28 NOTE — Telephone Encounter (Signed)
Pt wife cld reporting pts HR 115 with increased metop.  50 mg bid.  Pt bp 126/81.  Per Roderic Palau, NP take 75 mg metop in the morning and 50 mg at night, keep appt with [place] Monday and we will get set up for [facility] after he comes in.  Pt wife understood

## 2016-03-31 ENCOUNTER — Ambulatory Visit (INDEPENDENT_AMBULATORY_CARE_PROVIDER_SITE_OTHER): Payer: Medicare Other | Admitting: *Deleted

## 2016-03-31 ENCOUNTER — Ambulatory Visit (HOSPITAL_COMMUNITY)
Admission: RE | Admit: 2016-03-31 | Discharge: 2016-03-31 | Disposition: A | Discharge status: Home / Self Care | Payer: Medicare Other | Source: Ambulatory Visit | Attending: Nurse Practitioner | Admitting: Nurse Practitioner

## 2016-03-31 ENCOUNTER — Encounter (HOSPITAL_COMMUNITY): Payer: Self-pay | Admitting: Nurse Practitioner

## 2016-03-31 VITALS — BP 136/86 | HR 116 | Ht 72.0 in | Wt 177.0 lb

## 2016-03-31 DIAGNOSIS — Z87891 Personal history of nicotine dependence: Secondary | ICD-10-CM | POA: Diagnosis not present

## 2016-03-31 DIAGNOSIS — I483 Typical atrial flutter: Secondary | ICD-10-CM

## 2016-03-31 DIAGNOSIS — Z9889 Other specified postprocedural states: Secondary | ICD-10-CM | POA: Diagnosis not present

## 2016-03-31 DIAGNOSIS — I2510 Atherosclerotic heart disease of native coronary artery without angina pectoris: Secondary | ICD-10-CM | POA: Diagnosis not present

## 2016-03-31 DIAGNOSIS — I10 Essential (primary) hypertension: Secondary | ICD-10-CM | POA: Insufficient documentation

## 2016-03-31 DIAGNOSIS — Z5181 Encounter for therapeutic drug level monitoring: Secondary | ICD-10-CM

## 2016-03-31 DIAGNOSIS — Z79899 Other long term (current) drug therapy: Secondary | ICD-10-CM | POA: Insufficient documentation

## 2016-03-31 DIAGNOSIS — Z85820 Personal history of malignant melanoma of skin: Secondary | ICD-10-CM | POA: Insufficient documentation

## 2016-03-31 DIAGNOSIS — Z951 Presence of aortocoronary bypass graft: Secondary | ICD-10-CM | POA: Diagnosis not present

## 2016-03-31 DIAGNOSIS — Z7901 Long term (current) use of anticoagulants: Secondary | ICD-10-CM | POA: Diagnosis not present

## 2016-03-31 DIAGNOSIS — I4892 Unspecified atrial flutter: Secondary | ICD-10-CM | POA: Insufficient documentation

## 2016-03-31 LAB — POCT INR: INR: 3.5

## 2016-03-31 NOTE — Progress Notes (Signed)
Primary Care Physician: [name], MD Referring Physician: Dr. [name] is a 83 y.o. male with a h/o afib.He was in [facility] 11/14 thru 11-16 for treatment of aflutter with RVR. He has been on amiodarone years ago and had been in SR off ADD therapy.   He was started on IV Diltiazem with improvement in ventricular rate. INR was subtherapeutic on arrival.  He was seen by Dr [name] and underwent TEE guided cardioversion.  He unfortunately reverted back to atrial tachycardia later that day. Dr [name] advised reinitiating of amiodarone followed by repeat DCCV next week if he has persistent tachycardia. He did convert on his own and is feeling well. He is now on amiodarone 200 mg a day. His coumadin is followed by coumadin clinic checked last yesterday at 3.1. BP intially elevated by rechecked by me at 140/84.  He was seen back by [name], NP, 03/11/16 and had reverted back to AT with RVR the day before. He was reloaded on amiodarone 200 mg bid and metoprolol 50 mg bid was added. On return 2/13, he remains atach vrs aflutter at 123 bpm. It was discussed pursing DCCV ahd he is in agreement.   He did have successful cardioversion but then reported a heart rate of 40 bpm. Metoprolol was cut in half and a day or two later he reported that he was back in afib. He is in clinic today with aflutter at 116 bpm, pr int 138 ms, qrs int 160 ms, qtc 533. His metoprolol was increased to 50 mg 1/12 tab in am and 1 tab in pm on this past Friday. He states that he feels ok but notices fatigue if he tries to be overly  exertional.   Today, he denies symptoms of  chest pain,  orthopnea, PND, lower extremity edema, dizziness, presyncope, syncope, or neurologic sequela. Positive for decreased exercise tolerance with afib.. The patient is tolerating medications without difficulties and is otherwise without complaint today.   Past Medical History:  Diagnosis Date  . ABUSE, ALCOHOL, IN REMISSION 04/08/2007  . ALLERGIC  RHINITIS 09/29/2006  . Atrial flutter ([place]) 03/28/2010   a. s/p prior rfca;  b. recurrent paroxysmal flutter 04/2012;  c. pradaxa initiated 04/2012.  [name] BENIGN PROSTATIC HYPERTROPHY 09/29/2006  . BPPV (benign paroxysmal positional vertigo) 04/08/2007  . CAD (coronary artery disease)    a. LHC 2/17: EF 55-65%, LM 75, pLAD 75, oD1 75, oD2 65, D3 85, oLCx 99, oOM1 75, pRCA 25 >> CABG  . Carotid stenosis    a. Carotid US 2/17:  Bilateral ICA 1-39% ICA  . COLONIC POLYPS, HX OF 06/23/2007  . 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 Diverticulosis   . ERECTILE DYSFUNCTION 09/29/2006  . GLUCOSE INTOLERANCE 03/19/2010  . HYPERLIPIDEMIA 09/29/2006  . HYPERTENSION 09/29/2006  . Long term (current) use of anticoagulants 04/26/2010  . MELANOMA, MALIGNANT, SKIN NOS 09/29/2006   other skin cancers -no further melanoma  . OSTEOARTHROSIS NOS, OTHER [facility] SITE 09/29/2006  . Other specified forms of hearing loss 08/10/2009  . VENTRICULAR TACHYCARDIA 09/29/2006   Past Surgical History:  Procedure Laterality Date  . CARDIAC CATHETERIZATION N/A 03/12/2015   Procedure: Left Heart Cath and Coronary Angiography;  Surgeon: [name], MD;  Location: [place] [facility];  Service: Cardiovascular;  Laterality: N/A;  . CARDIOVERSION N/A 12/19/2015   Procedure: CARDIOVERSION;  Surgeon: [name], MD;  Location: [place];  Service: Cardiovascular;  Laterality: N/A;  . CARDIOVERSION N/A  03/24/2016   Procedure: CARDIOVERSION;  Surgeon: [name], MD;  Location: [place];  Service: Cardiovascular;  Laterality: N/A;  . CATARACT EXTRACTION, BILATERAL Bilateral   . CLIPPING OF ATRIAL APPENDAGE N/A 03/14/2015   Procedure: CLIPPING OF ATRIAL APPENDAGE;  Surgeon: [name], MD;  Location: [place];  Service: Open Heart Surgery;  Laterality: N/A;  . COLONOSCOPY WITH PROPOFOL N/A 12/08/2014   Procedure: COLONOSCOPY WITH PROPOFOL;  Surgeon: [name], MD;  Location: [facility];  Service: Endoscopy;  Laterality:  N/A;  . CORONARY ARTERY BYPASS GRAFT N/A 03/14/2015   Procedure: CORONARY ARTERY BYPASS GRAFTING (CABG) x 4 (LIMA to LAD, SVG to DIAGONAL 2, SVG SEQUENTIALLY to OM1 and OM2);  Surgeon: [name], MD;  Location: [place];  Service: Open Heart Surgery;  Laterality: N/A;  . JOINT REPLACEMENT     Right knee  . Left knee arthroscopy    . Left rotato cuff    . TEE WITHOUT CARDIOVERSION N/A 03/14/2015   Procedure: TRANSESOPHAGEAL ECHOCARDIOGRAM (TEE);  Surgeon: [name], MD;  Location: [place];  Service: Open Heart Surgery;  Laterality: N/A;  . TEE WITHOUT CARDIOVERSION N/A 12/19/2015   Procedure: TRANSESOPHAGEAL ECHOCARDIOGRAM (TEE);  Surgeon: [name], MD;  Location: [place];  Service: Cardiovascular;  Laterality: N/A;  . TEE WITHOUT CARDIOVERSION N/A 03/24/2016   Procedure: TRANSESOPHAGEAL ECHOCARDIOGRAM (TEE);  Surgeon: [name], MD;  Location: [place];  Service: Cardiovascular;  Laterality: N/A;  . TONSILLECTOMY      Current Outpatient Prescriptions  Medication Sig Dispense Refill  . amiodarone (PACERONE) 200 MG tablet Take 200 mg by mouth daily.     [name] atorvastatin (LIPITOR) 10 MG tablet Take 1 tablet (10 mg total) by mouth daily. 90 tablet 3  . fish oil-omega-3 fatty acids 1000 MG capsule Take 1 g by mouth daily.     [name] HYDROcodone-acetaminophen (NORCO/VICODIN) 5-325 MG tablet Take 1 tablet by mouth every 6 (six) hours as needed for moderate pain (body pain). 90 tablet 0  . loratadine (CLARITIN) 10 MG tablet Take 10 mg by mouth daily.    . metoprolol tartrate (LOPRESSOR) 50 MG tablet Take 1 tablet (50 mg total) by mouth 2 (two) times daily. (Patient taking differently: Take 50 mg by mouth 2 (two) times daily. Take 1.5 tablet in the morning and 1 tablet in the evening) 180 tablet 3  . Multiple Vitamin (MULTIVITAMIN) tablet Take 1 tablet by mouth daily.      . psyllium (METAMUCIL) 58.6 % powder Take 1 packet by mouth 3 (three) times daily.    . tamsulosin  (FLOMAX) 0.4 MG CAPS capsule Take 0.4 mg by mouth 1 day or 1 dose.    . warfarin (COUMADIN) 5 MG tablet Take as directed by Coumadin clinic. (Patient taking differently: Take 5-7.5 mg by mouth daily at 6 PM. Takes 5mg  all days except wed-takes 7.5mg ) 100 tablet 0   No current facility-administered medications for this encounter.     Allergies  Allergen Reactions  . Amiodarone Hcl Other (See Comments)    REACTION: intolerance Patient reports photosensitivity  . Ace Inhibitors Cough    Social History   Social History  . Marital status: Married    Spouse name: N/A  . Number of children: N/A  . Years of education: N/A   Occupational History  . Retired, Press photographer    Social History Main Topics  . Smoking status: Former Research scientist (life sciences)  . Smokeless tobacco: Never Used     Comment:  Quit smoking and drinking 20 years ago  . Alcohol use No  . Drug use: No  . Sexual activity: Not on file   Other Topics Concern  . Not on file   Social History Narrative   Married, 3 children. Retired Press photographer. Lives in [place] with  wife and kids. He is retired from Press photographer.     Family History  Problem Relation Age of Onset  . Diabetes Brother   . Esophageal cancer Father   . Colon cancer Neg Hx   . Stomach cancer Neg Hx     ROS- All systems are reviewed and negative except as per the HPI above  Physical Exam: Vitals:   03/31/16 1145  BP: 136/86  Pulse: (!) 116  Weight: 177 lb (80.3 kg)  Height: 6' (1.829 m)   Wt Readings from Last 3 Encounters:  03/31/16 177 lb (80.3 kg)  03/24/16 177 lb (80.3 kg)  03/18/16 177 lb (80.3 kg)    Labs: Lab Results  Component Value Date   NA 135 03/11/2016   K 4.6 03/11/2016   CL 99 (L) 03/11/2016   CO2 26 03/11/2016   GLUCOSE 141 (H) 03/11/2016   BUN 9 03/11/2016   CREATININE 1.07 03/11/2016   CALCIUM 9.4 03/11/2016   MG 2.1 12/18/2015   Lab Results  Component Value Date   INR 3.5 03/31/2016   Lab Results  Component Value Date   CHOL 123 06/14/2015    HDL 50.50 06/14/2015   LDLCALC 63 06/14/2015   TRIG 47.0 06/14/2015     GEN- The patient is well appearing, alert and oriented x 3 today.   Head- normocephalic, atraumatic Eyes-  Sclera clear, conjunctiva pink Ears- hearing intact Oropharynx- clear Neck- supple, no JVP Lymph- no cervical lymphadenopathy Lungs- Clear to ausculation bilaterally, normal work of breathing Heart- irregular rate and rhythm, no murmurs, rubs or gallops, PMI not laterally displaced GI- soft, NT, ND, + BS Extremities- no clubbing, cyanosis, or edema MS- no significant deformity or atrophy Skin- no rash or lesion Psych- euthymic mood, full affect Neuro- strength and sensation are intact  EKG- Atach vrs flutter at 116 bpm,   RBBB, PR int 56 ms, qrs int 160, qtc 533 ms Epic records reviewed    Assessment and Plan:  1. Persistent atrial tachyarrhythmias, atach/aflutter Has failed amiodarone and cardioversion Continue amiodarone at  200 mg a day but would him like to see Dr [name] to discuss potential stopping of amiodarone Pt showed some interest in tikosyn Continue metoprolol 75 mg am and 50 mg pm Continue warfarin  2. HTN Stable  3. CAD, s/p bypass 03/14/15  afib clinic as needed   Butch Penny C. Nizar Cutler, [facility] [address] [place], [place] [address] [phone]

## 2016-04-01 ENCOUNTER — Encounter: Payer: Self-pay | Admitting: Cardiology

## 2016-04-01 NOTE — Progress Notes (Unsigned)
Jonathan Orozco    Date of visit:  04/01/2016 DOB:  February 01, 1929    Age:  83 yrs. Medical record number:  [id number]     Account number:  [id number] Primary Care Provider: Biagio Borg ____________________________ CURRENT DIAGNOSES  1. Atypical Atrial Flutter  2. Long term (current) use of anticoagulants  3. Presence of aortocoronary bypass graft  4. CAD Native without angina  5. Hyperlipidemia  6. Hypertensive heart disease without heart failure ____________________________ ALLERGIES  Ace Inhibitors, Intolerance-cough ____________________________ MEDICATIONS  1. amiodarone 200 mg tablet, 1 p.o. daily  2. warfarin 5 mg tablet, take as directed  3. metoprolol tartrate 50 mg tablet, 1.5 tablets in AM and 1 tablet in PM  4. atorvastatin 10 mg tablet, 1 p.o. daily  5. tamsulosin 0.4 mg capsule, 1 p.o. daily  6. hydrocodone 5 mg-acetaminophen 325 mg tablet, PRN  7. loratadine 10 mg tablet, 1 p.o. daily  8. Probiotic 10 billion cell capsule, 1 p.o. daily  9. multivitamin tablet, 1 p.o. daily  10. Fish Oil 1,000 mg (120 mg-180 mg) capsule, 1 p.o. daily ____________________________ HISTORY OF PRESENT ILLNESS This very nice 83 year old male is seen for cardiac opinion. The patient is a very complicated medical history. Extensive review of the old medical records was done. IN the remote past he was diagnosed with right ventricular outflow tract ventricular tachycardia and he was managed medically for a number of years on amiodarone but was taken off of it according to the chart because of photosensitivity. He then developed recurrent atrial flutter treated with cardioversionsand  eventually had an EP study done in 2010 showing multiple circuits in the left atrium  not felt to be stable for ablation. He has been managed with beta blockers but has had occasional atrial flutter requiring cardioversion one in October 16 and another one in Jan 17.Jonathan Orozco He saw Dr. Chancy Milroy earlyFebf 2017 was felt to have cresendo  angina and a catheterization was found to have left main LAD and  circumflex disease and underwent bypass grafting to the LAD, diagonal and marginals 1-2. He had clipping of the atrial appendagebut  did not have a Maze procedure done at that time.  He had atrial fibrillation following surgery and was placed back on amiodarone this was later discontinued by Dr. [name]. He did fairly well after that and was involved in rehabilitation. He did well until November when he developed recurrent atrial arrhythmias. He underwent TEE  cardioversion and went back into  atrial tachycardia later that day. Dr. [name] placed him back on amiodarone and he later reverted to sinus rhythm. He was seen in the atrial fibrillation  clinic in January and found to be back in atrial flutter and his amiodarone was increased. He underwent a TEE cardioversion on February 19th and the reverted back to atrial flutter after several phone calls and adjustments of medications.  He states he was told the atrial fib clinic could not help him anymore and he made an appointment to see me. He is frustrated over seeing extenders at the atrial fibrillation clinic and not a doctor. He finds that he tires easily but is able to walk and do normal activities. He has no angina. He denies PND, orthopnea or edema. Other noncardiac complaints including unsteady gait, difficulty hearing, and some urinary frequency. ____________________________ PAST HISTORY  Past Medical Illnesses:  hypertension, hyperlipidemia, osteoarthritis, BPH, history of benign positional vertigo;  Cardiovascular Illnesses:  CAD, atrial flutter-paroxysmal, History of RVOT VT, Carotid artery disease;  Infectious Diseases:  no previous history of significant infectious diseases;  Surgical Procedures:  knee replacement-rt, rotator cuff surgery, arthroscopic lt knee surgery, CABG, tonsillectomy;  Trauma History:  no previous history of significant trauma;  NYHA Classification:  I;  Cardiology  Procedures-Invasive:  cardiac cath (left) February 2018, CABG with LIMA to LAD, SVG to dx and SVG to OM1-2   03/24/15 Dr. Roxan Hockey, EP study 2010 Dr. Rayann Heman Multiple left atrial flutter circuits not thought good for ablation;  Cardiology Procedures-Noninvasive:  echocardiogram;  Cardiac Cath Results:  80% stenosis distal Left main, 99% stenosis ostial CFX, 70% stenosis OM 1, 70% stenosis ostial Diag 1, 70% stenosis proximal LAD, 20 % stenosis proximal RCA;  Peripheral Vascular Procedures:  no previous invasive peripheral vascular procedures.;  LVEF not documented,   ____________________________ CARDIO-PULMONARY TEST DATES EKG Date:  04/01/2016;   ____________________________ FAMILY HISTORY Brother -- Brother dead, Malignant neoplasm of lung Brother -- Brother alive with problem Father -- Father dead, Cancer Mother -- Mother dead, Congestive heart failure Sister -- Sister dead, Malignant neoplasm of lung Sister -- Sister alive and well ____________________________ SOCIAL HISTORY Alcohol Use:  does not use alcohol;  Smoking:  used to smoke but quit;  Diet:  regular diet;  Lifestyle:  married;  Exercise:  some exercise;  Occupation:  retired and multiple jobs;  Residence:  lives with wife;   ____________________________ REVIEW OF SYSTEMS General:  denies recent weight change, fatique or change in exercise tolerance.  Integumentary:no rashes or new skin lesions. Eyes: denies diplopia, history of glaucoma or visual problems. Ears, Nose, Throat, Mouth:  denies any hearing loss, epistaxis, hoarseness or difficulty speaking. Respiratory: dyspnea with exertion Cardiovascular:  please review HPI Abdominal: dyspepsia Genitourinary-Male: frequency, nocturia  Musculoskeletal:  denies arthritis, venous insufficiency, or muscle weakness Neurological: unsteady gait Psychiatric:  denies depression or anxiety Hematological/Immunologic:  denies any food allergies, bleeding  disorders. ____________________________ PHYSICAL EXAMINATION VITAL SIGNS  Blood Pressure:  120/90 Sitting, Left arm, regular cuff  , 124/90 Standing, Left arm and regular cuff   Pulse:  118/min. Weight:  179.00 lbs. Height:  71"BMI: 25  Constitutional:  pleasant white male in no acute distress, appears younger than stated age Skin:  warm and dry to touch, no apparent skin lesions, or masses noted. Head:  normocephalic, normal hair pattern, no masses or tenderness Eyes:  EOMS Intact, PERRLA, C and S clear, Funduscopic exam not done. ENT:  ears, nose and throat reveal no gross abnormalities.  Dentition good. Neck:  supple, without massess. No JVD, thyromegaly or carotid bruits. Carotid upstroke normal. Chest:  normal symmetry, clear to auscultation., healed median sternotomy scar Cardiac:  regular rhythm, normal S1 and S2, No S3 or S4, no murmurs, gallops or rubs detected. Abdomen:  abdomen soft,non-tender, no masses, no hepatospenomegaly, or aneurysm noted Peripheral Pulses:  the femoral,dorsalis pedis, and posterior tibial pulses are full and equal bilaterally with no bruits auscultated. Extremities & Back:  well healed saphenous vein donor site RLE, no spinal abnormalities noted., normal muscle strength and tone., no edema present Neurological:  no gross motor or sensory deficits noted, affect appropriate, oriented x3. ____________________________ IMPRESSIONS/PLAN  1. Atypical atrial flutter having failed amiodarone, and several cardioversions 2. He noted previously in the past also 3. Coronary artery disease with previous bypass grafting for left main disease 4. Long-term use of anticoagulation  Recommendations:  EKG personally reviewed by me shows atrial flutter atypical one block at a rate of 115, IV conduction delay. He is now anticoagulated. I had a long  discussion with him and his wife about management of atrial arrhythmias. I would suggest that he make an appointment with Dr.  [name] about pursuing further options. The most likely long-term solution to this might be an AV nodal ablation as this would give stable rate control for him. Tikosyn is an option but is not guaranteed success and would involve a repeat hospitalization. At this point I think the major consideration would be continuity of his EP care as this seemed to be the thing that frustrated him the most.   Greater than 60 minutes spent including greater than 50% of time face to face.  ____________________________ TODAYS ORDERS  1.  Consult Dr. [name]: Schedule at convenience  2. Return: prn  3. 12 Lead EKG: Today                       ____________________________ Cardiology Physician:  Kerry Hough MD [facility]

## 2016-04-14 ENCOUNTER — Ambulatory Visit (INDEPENDENT_AMBULATORY_CARE_PROVIDER_SITE_OTHER): Payer: Medicare Other | Admitting: Pharmacist

## 2016-04-14 DIAGNOSIS — I483 Typical atrial flutter: Secondary | ICD-10-CM | POA: Diagnosis not present

## 2016-04-14 DIAGNOSIS — Z5181 Encounter for therapeutic drug level monitoring: Secondary | ICD-10-CM | POA: Diagnosis not present

## 2016-04-14 LAB — POCT INR: INR: 2.6

## 2016-04-15 ENCOUNTER — Encounter: Payer: Self-pay | Admitting: Internal Medicine

## 2016-04-15 ENCOUNTER — Telehealth: Payer: Self-pay | Admitting: Internal Medicine

## 2016-04-15 DIAGNOSIS — I480 Paroxysmal atrial fibrillation: Secondary | ICD-10-CM

## 2016-04-15 MED ORDER — DILTIAZEM HCL 30 MG PO TABS: ORAL_TABLET | ORAL | 3 refills | Status: DC

## 2016-04-15 NOTE — Progress Notes (Signed)
Spoke with pt and his wife   He remains in atrial fib VR >>110    He feels lousy Will check amio level They were interested in dofetilide  The issue is going to be accomplish rate control and so will try and add low dose dilt added to metoprolol We have discussed AV ablation as well

## 2016-04-15 NOTE — Telephone Encounter (Signed)
Message received from [facility] in scheduling that the patient is back in a-fib and needed to see Dr. [name]. Dr. [name] called and spoke with the patient- will draw an amiodarone level & start diltiazem 30 mg - take 1-2 tablets by mouth every 8 hours as tolerated for SBP > 100.

## 2016-04-16 ENCOUNTER — Encounter: Payer: Self-pay | Admitting: Internal Medicine

## 2016-04-16 ENCOUNTER — Telehealth: Payer: Self-pay | Admitting: Internal Medicine

## 2016-04-16 ENCOUNTER — Other Ambulatory Visit: Payer: Medicare Other | Admitting: *Deleted

## 2016-04-16 ENCOUNTER — Ambulatory Visit (INDEPENDENT_AMBULATORY_CARE_PROVIDER_SITE_OTHER): Payer: Medicare Other | Admitting: Internal Medicine

## 2016-04-16 VITALS — BP 126/84 | HR 105 | Temp 97.6°F | Wt 181.0 lb

## 2016-04-16 DIAGNOSIS — I208 Other forms of angina pectoris: Secondary | ICD-10-CM

## 2016-04-16 DIAGNOSIS — R739 Hyperglycemia, unspecified: Secondary | ICD-10-CM

## 2016-04-16 DIAGNOSIS — I200 Unstable angina: Secondary | ICD-10-CM

## 2016-04-16 DIAGNOSIS — I472 Ventricular tachycardia: Secondary | ICD-10-CM

## 2016-04-16 DIAGNOSIS — L03115 Cellulitis of right lower limb: Secondary | ICD-10-CM

## 2016-04-16 DIAGNOSIS — F411 Generalized anxiety disorder: Secondary | ICD-10-CM | POA: Diagnosis not present

## 2016-04-16 DIAGNOSIS — I480 Paroxysmal atrial fibrillation: Secondary | ICD-10-CM

## 2016-04-16 DIAGNOSIS — I483 Typical atrial flutter: Secondary | ICD-10-CM

## 2016-04-16 DIAGNOSIS — I4729 Other ventricular tachycardia: Secondary | ICD-10-CM

## 2016-04-16 MED ORDER — CITALOPRAM HYDROBROMIDE 10 MG PO TABS: 10.0000 mg | ORAL_TABLET | Freq: Every day | ORAL | 3 refills | Status: DC

## 2016-04-16 MED ORDER — CEPHALEXIN 500 MG PO CAPS: 500.0000 mg | ORAL_CAPSULE | Freq: Three times a day (TID) | ORAL | 0 refills | Status: AC

## 2016-04-16 MED ORDER — DILTIAZEM HCL 30 MG PO TABS: ORAL_TABLET | ORAL | 3 refills | Status: DC

## 2016-04-16 MED ORDER — ALPRAZOLAM 0.25 MG PO TABS: 0.2500 mg | ORAL_TABLET | Freq: Every day | ORAL | 2 refills | Status: DC | PRN

## 2016-04-16 NOTE — Telephone Encounter (Signed)
Dorothea Ogle , pharamacist at [facility] is calling because he states that the prescription for Diltiazem, 30 mg was sent to the wrong pharmacy. [facility] could not transfer it to because it was sent without instructions. Dorothea Ogle would like to know if our office could send the prescription for Diltiazem, 30 mg to [facility]. Thanks. [place], [place], [place] [address]  [phone]

## 2016-04-16 NOTE — Progress Notes (Signed)
Subjective:    Patient ID: Jonathan Orozco, male    DOB: 02-16-1928, 83 y.o.   MRN: [id number]  HPI  Here to f/u with c/o right leg mid pretibial sore area x 3 days now worsening with red/swelling, tender without fever, red streaks, or drainage.  Does not recall any trauma. Pt denies chest pain, increased sob or doe, wheezing, orthopnea, PND, increased LE swelling, palpitations, dizziness or syncope.  Pt denies new neurological symptoms such as new headache, or facial or extremity weakness or numbness   Pt denies polydipsia, polyuria.  Has had mild worsening depressive symptoms, but no suicidal ideation, or panic; has recent worsening anxiety, asks for tx.  Past Medical History:  Diagnosis Date  . ABUSE, ALCOHOL, IN REMISSION 04/08/2007  . ALLERGIC RHINITIS 09/29/2006  . Atrial flutter ([place]) 03/28/2010   a. s/p prior rfca;  b. recurrent paroxysmal flutter 04/2012;  c. pradaxa initiated 04/2012.  [name] BENIGN PROSTATIC HYPERTROPHY 09/29/2006  . BPPV (benign paroxysmal positional vertigo) 04/08/2007  . CAD (coronary artery disease)    a. LHC 2/17: EF 55-65%, LM 75, pLAD 75, oD1 75, oD2 65, D3 85, oLCx 99, oOM1 75, pRCA 25 >> CABG  . Carotid stenosis    a. Carotid US 2/17:  Bilateral ICA 1-39% ICA  . COLONIC POLYPS, HX OF 06/23/2007  . 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 Diverticulosis   . ERECTILE DYSFUNCTION 09/29/2006  . GLUCOSE INTOLERANCE 03/19/2010  . HYPERLIPIDEMIA 09/29/2006  . HYPERTENSION 09/29/2006  . Long term (current) use of anticoagulants 04/26/2010  . MELANOMA, MALIGNANT, SKIN NOS 09/29/2006   other skin cancers -no further melanoma  . OSTEOARTHROSIS NOS, OTHER [facility] SITE 09/29/2006  . Other specified forms of hearing loss 08/10/2009  . VENTRICULAR TACHYCARDIA 09/29/2006   Past Surgical History:  Procedure Laterality Date  . CARDIAC CATHETERIZATION N/A 03/12/2015   Procedure: Left Heart Cath and Coronary Angiography;  Surgeon: [name], MD;  Location: [place] CV LAB;  Service:  Cardiovascular;  Laterality: N/A;  . CARDIOVERSION N/A 12/19/2015   Procedure: CARDIOVERSION;  Surgeon: [name], MD;  Location: [facility] ENDOSCOPY;  Service: Cardiovascular;  Laterality: N/A;  . CARDIOVERSION N/A 03/24/2016   Procedure: CARDIOVERSION;  Surgeon: [name], MD;  Location: [place];  Service: Cardiovascular;  Laterality: N/A;  . CATARACT EXTRACTION, BILATERAL Bilateral   . CLIPPING OF ATRIAL APPENDAGE N/A 03/14/2015   Procedure: CLIPPING OF ATRIAL APPENDAGE;  Surgeon: [name], MD;  Location: [place];  Service: Open Heart Surgery;  Laterality: N/A;  . COLONOSCOPY WITH PROPOFOL N/A 12/08/2014   Procedure: COLONOSCOPY WITH PROPOFOL;  Surgeon: [name], MD;  Location: [facility] ENDOSCOPY;  Service: Endoscopy;  Laterality: N/A;  . CORONARY ARTERY BYPASS GRAFT N/A 03/14/2015   Procedure: CORONARY ARTERY BYPASS GRAFTING (CABG) x 4 (LIMA to LAD, SVG to DIAGONAL 2, SVG SEQUENTIALLY to OM1 and OM2);  Surgeon: [name], MD;  Location: [place];  Service: Open Heart Surgery;  Laterality: N/A;  . JOINT REPLACEMENT     Right knee  . Left knee arthroscopy    . Left rotato cuff    . TEE WITHOUT CARDIOVERSION N/A 03/14/2015   Procedure: TRANSESOPHAGEAL ECHOCARDIOGRAM (TEE);  Surgeon: [name], MD;  Location: [place];  Service: Open Heart Surgery;  Laterality: N/A;  . TEE WITHOUT CARDIOVERSION N/A 12/19/2015   Procedure: TRANSESOPHAGEAL ECHOCARDIOGRAM (TEE);  Surgeon: [name], MD;  Location: [facility] ENDOSCOPY;  Service: Cardiovascular;  Laterality: N/A;  . TEE  WITHOUT CARDIOVERSION N/A 03/24/2016   Procedure: TRANSESOPHAGEAL ECHOCARDIOGRAM (TEE);  Surgeon: [name], MD;  Location: [facility] ENDOSCOPY;  Service: Cardiovascular;  Laterality: N/A;  . TONSILLECTOMY      reports that he has quit smoking. He has never used smokeless tobacco. He reports that he does not drink alcohol or use drugs. family history includes Diabetes in his brother; Esophageal cancer in his  father. Allergies  Allergen Reactions  . Amiodarone Hcl Other (See Comments)    REACTION: intolerance Patient reports photosensitivity  . Ace Inhibitors Cough   Current Outpatient Prescriptions on File Prior to Visit  Medication Sig Dispense Refill  . amiodarone (PACERONE) 200 MG tablet Take 200 mg by mouth daily.     [name] atorvastatin (LIPITOR) 10 MG tablet Take 1 tablet (10 mg total) by mouth daily. 90 tablet 3  . fish oil-omega-3 fatty acids 1000 MG capsule Take 1 g by mouth daily.     [name] HYDROcodone-acetaminophen (NORCO/VICODIN) 5-325 MG tablet Take 1 tablet by mouth every 6 (six) hours as needed for moderate pain (body pain). 90 tablet 0  . loratadine (CLARITIN) 10 MG tablet Take 10 mg by mouth daily.    . metoprolol tartrate (LOPRESSOR) 50 MG tablet Take 1 tablet (50 mg total) by mouth 2 (two) times daily. (Patient taking differently: Take 50 mg by mouth 2 (two) times daily. Take 1.5 tablet in the morning and 1 tablet in the evening) 180 tablet 3  . Multiple Vitamin (MULTIVITAMIN) tablet Take 1 tablet by mouth daily.      . psyllium (METAMUCIL) 58.6 % powder Take 1 packet by mouth 3 (three) times daily.    . tamsulosin (FLOMAX) 0.4 MG CAPS capsule Take 0.4 mg by mouth 1 day or 1 dose.    . warfarin (COUMADIN) 5 MG tablet Take as directed by Coumadin clinic. (Patient taking differently: Take 5-7.5 mg by mouth daily at 6 PM. Takes 5mg  all days except wed-takes 7.5mg ) 100 tablet 0   No current facility-administered medications on file prior to visit.    Review of Systems  Constitutional: Negative for unusual diaphoresis or night sweats HENT: Negative for ear swelling or discharge Eyes: Negative for worsening visual haziness  Respiratory: Negative for choking and stridor.   Gastrointestinal: Negative for distension or worsening eructation Genitourinary: Negative for retention or change in urine volume.  Musculoskeletal: Negative for other MSK pain or swelling Skin: Negative for color  change and worsening wound Neurological: Negative for tremors and numbness other than noted  Psychiatric/Behavioral: Negative for decreased concentration or agitation other than above   All other system neg per pt    Objective:   Physical Exam BP 126/84   Pulse (!) 105   Temp 97.6 F (36.4 C)   Wt 181 lb (82.1 kg)   SpO2 99%   BMI 24.55 kg/m  VS noted, mild ill appearing Constitutional: Pt is oriented to person, place, and time. Appears well-developed and well-nourished, in no significant distress Head: Normocephalic and atraumatic  Eyes: Conjunctivae and EOM are normal. Pupils are equal, round, and reactive to light Right Ear: External ear normal.  Left Ear: External ear normal Nose: Nose normal.  Mouth/Throat: Oropharynx is clear and moist  Neck: Normal range of motion. Neck supple. No JVD present. No tracheal deviation present or significant neck LA or mass Cardiovascular: Normal rate, regular rhythm, normal heart sounds and intact distal pulses.   Pulmonary/Chest: Effort normal and breath sounds without rales or wheezing  Neurological: Pt is alert  and oriented to person, place, and time. Pt has normal reflexes. No cranial nerve deficit. Motor grossly intact Skin: Skin is warm and dry. No rash noted or new ulcers but  3-4 cm area right mid pretibial marked tender red swelling without fluctuance or drainage, on ulceration or red streaks Psychiatric:  Has nervous depressed mood and affect. Behavior is normal.  No other exam findings    Assessment & Plan:

## 2016-04-16 NOTE — Patient Instructions (Signed)
Please take all new medication as prescribed - the xanax (in hardcopy), c[facility]opram [facility], and Cephalexin antibiotic to [facility].  Please continue all other medications as before, and refills have been done if requested.  Please have the pharmacy call with any other refills you may need.  Please keep your appointments with your specialists as you may have planned

## 2016-04-19 LAB — AMIODARONE LEVEL
AMIODARONE LVL: 0.9 ug/mL — AB (ref 1.0–2.5)
N-DESETHYL-AMIODARONE: 0.7 ug/mL — AB (ref 1.0–2.5)

## 2016-04-20 NOTE — Assessment & Plan Note (Signed)
stable overall by history and exam, recent data reviewed with pt, and pt to continue medical treatment as before,  to f/u any worsening symptoms or concerns Lab Results  Component Value Date   HGBA1C 5.9 06/14/2015   Pt to call for onset polys or cbg > 200 with illness

## 2016-04-20 NOTE — Assessment & Plan Note (Addendum)
Mild to mod, for xanax prn, add citalopram 10 qd, declines counseling referral,  to f/u any worsening symptoms or concerns

## 2016-04-20 NOTE — Assessment & Plan Note (Signed)
Mild to mod, for antibx course,  to f/u any worsening symptoms or concerns 

## 2016-04-25 ENCOUNTER — Telehealth: Payer: Self-pay | Admitting: Internal Medicine

## 2016-04-25 MED ORDER — WARFARIN SODIUM 5 MG PO TABS: ORAL_TABLET | ORAL | 0 refills | Status: DC

## 2016-04-25 NOTE — Telephone Encounter (Signed)
°*  STAT* If patient is at the pharmacy, call can be transferred to refill team.   1. Which medications need to be refilled? (please list name of each medication and dose if known) Coumadin 5MG   2. Which pharmacy/location (including street and city if local pharmacy) is medication to be sent to?[facility] - [place], [place]  3. Do they need a 30 day or 90 day supply?    Patient wife calling states that she needs a new prescription for Jonathan Orozco Coumadin, 5 mg sent to [place], [place]. Thanks.

## 2016-04-25 NOTE — Telephone Encounter (Signed)
RX refill sent as requested.  

## 2016-04-28 ENCOUNTER — Telehealth: Payer: Self-pay | Admitting: Internal Medicine

## 2016-04-28 NOTE — Telephone Encounter (Signed)
New Message  Pt voiced to contact her back on mobile phone.

## 2016-04-28 NOTE — Telephone Encounter (Signed)
Spoke with pt and pt's wife about amiodarone level done 04/16/16

## 2016-04-30 ENCOUNTER — Encounter: Payer: Self-pay | Admitting: Internal Medicine

## 2016-05-12 ENCOUNTER — Ambulatory Visit (INDEPENDENT_AMBULATORY_CARE_PROVIDER_SITE_OTHER): Payer: Medicare Other | Admitting: Internal Medicine

## 2016-05-12 ENCOUNTER — Ambulatory Visit (INDEPENDENT_AMBULATORY_CARE_PROVIDER_SITE_OTHER): Payer: Medicare Other | Admitting: *Deleted

## 2016-05-12 ENCOUNTER — Encounter: Payer: Self-pay | Admitting: *Deleted

## 2016-05-12 ENCOUNTER — Encounter: Payer: Self-pay | Admitting: Internal Medicine

## 2016-05-12 VITALS — BP 110/84 | HR 110 | Ht 73.0 in | Wt 183.0 lb

## 2016-05-12 DIAGNOSIS — I4892 Unspecified atrial flutter: Secondary | ICD-10-CM | POA: Diagnosis not present

## 2016-05-12 DIAGNOSIS — Z01812 Encounter for preprocedural laboratory examination: Secondary | ICD-10-CM

## 2016-05-12 DIAGNOSIS — I480 Paroxysmal atrial fibrillation: Secondary | ICD-10-CM

## 2016-05-12 DIAGNOSIS — Z5181 Encounter for therapeutic drug level monitoring: Secondary | ICD-10-CM

## 2016-05-12 DIAGNOSIS — I483 Typical atrial flutter: Secondary | ICD-10-CM

## 2016-05-12 LAB — POCT INR: INR: 5

## 2016-05-12 MED ORDER — FUROSEMIDE 20 MG PO TABS: ORAL_TABLET | ORAL | 3 refills | Status: DC

## 2016-05-12 NOTE — Patient Instructions (Addendum)
Medication Instructions: - Your physician has recommended you make the following change in your medication:  1) Start lasix (furosemide) 20 mg- take one tablet by mouth daily x 7 days, then take one tablet by mouth daily as needed for swelling/ shortness of breath  Labwork: - Your physician recommends that you return for lab work: Friday 05/30/16- between 7:30 am- 4:45 pm (earlier the better so we can get your INR results back)  Procedures/Testing: - Your physician has recommended that you have a pacemaker inserted. A pacemaker is a small device that is placed under the skin of your chest or abdomen to help control abnormal heart rhythms. This device uses electrical pulses to prompt the heart to beat at a normal rate. Pacemakers are used to treat heart rhythms that are too slow. Wire (leads) are attached to the pacemaker that goes into the chambers of you heart. This is done in the hospital and usually requires and overnight stay. Please see the instruction sheet given to you today for more information.  - Your physician has recommended that you have an ablation. Catheter ablation is a medical procedure used to treat some cardiac arrhythmias (irregular heartbeats). During catheter ablation, a long, thin, flexible tube is put into a blood vessel in your groin (upper thigh), or neck. This tube is called an ablation catheter. It is then guided to your heart through the blood vessel. Radio frequency waves destroy small areas of heart tissue where abnormal heartbeats may cause an arrhythmia to start. Please see the instruction sheet given to you today.  Follow-Up: - Your physician recommends that you schedule a follow-up appointment in: about 10 days (from 06/02/16) with the device clinic for a wound check    Any Additional Special Instructions Will Be Listed Below (If Applicable).     If you need a refill on your cardiac medications before your next appointment, please call your pharmacy.

## 2016-05-12 NOTE — Progress Notes (Signed)
Patient Care Team: Biagio Borg, MD as PCP - General   HPI  Jonathan Orozco is a 83 y.o. male Seen in followup for outflow tract ectopy and atrial flutters for which  he underwent EP testing and 3-D mapping which failed to produce stable arrhythmias; medical therapy was undertaken.  He has been intolerant of amiodarone. 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    His atrial arrhythmias have been managed with rate control and anticoagulation with warfarin. Previously he had been on NOACs but was intolerant     He was cardioverted twice last fall while on amio with recurrent atrial flutter-two different ones, one clearly not typical the other not clearly typical, but close.  Mostly assoc with RVR HR >110 (Personally reviewed  )   He was seen in the afib clinic and with some frustration ended up going to see Dr Judith Part who called me and encouraged the pt to followup here, with the caveat that we would minimize extender confusions  We discussed dofetilide and amio level 04/16/16  was obtained ( 0.9)    2/17 he presented with exertional chest discomfort and underwent catheterization.  1. LM lesion, 75% stenosed. 2. Ost Cx lesion, 99% stenosed. 3. Ost 1st Mrg to 1st Mrg lesion, 75% stenosed. 4. Prox LAD to Mid LAD lesion, 75% stenosed. 5. Ost 1st Diag lesion, 75% stenosed. 6. Ost 2nd Diag to 2nd Diag lesion, 65% stenosed.        3rd Diag lesion, 85% stenosed. LV function was normal. He underwent bypass surgery with LA clipping but no MAZE   He continues to have problems with exercise intolerance.  There is some peripheral edema.   5/17 LDL 63  Past Medical History:  Diagnosis Date  . ABUSE, ALCOHOL, IN REMISSION 04/08/2007  . ALLERGIC RHINITIS 09/29/2006  . Atrial flutter ([place]) 03/28/2010   a. s/p prior rfca;  b. recurrent paroxysmal flutter 04/2012;  c. pradaxa initiated 04/2012.  [name] BENIGN PROSTATIC HYPERTROPHY 09/29/2006  .  BPPV (benign paroxysmal positional vertigo) 04/08/2007  . CAD (coronary artery disease)    a. LHC 2/17: EF 55-65%, LM 75, pLAD 75, oD1 75, oD2 65, D3 85, oLCx 99, oOM1 75, pRCA 25 >> CABG  . Carotid stenosis    a. Carotid US 2/17:  Bilateral ICA 1-39% ICA  . COLONIC POLYPS, HX OF 06/23/2007  . 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 Diverticulosis   . ERECTILE DYSFUNCTION 09/29/2006  . GLUCOSE INTOLERANCE 03/19/2010  . HYPERLIPIDEMIA 09/29/2006  . HYPERTENSION 09/29/2006  . Long term (current) use of anticoagulants 04/26/2010  . MELANOMA, MALIGNANT, SKIN NOS 09/29/2006   other skin cancers -no further melanoma  . OSTEOARTHROSIS NOS, OTHER [facility] SITE 09/29/2006  . Other specified forms of hearing loss 08/10/2009  . VENTRICULAR TACHYCARDIA 09/29/2006    Past Surgical History:  Procedure Laterality Date  . CARDIAC CATHETERIZATION N/A 03/12/2015   Procedure: Left Heart Cath and Coronary Angiography;  Surgeon: [name], MD;  Location: [place] CV LAB;  Service: Cardiovascular;  Laterality: N/A;  . CARDIOVERSION N/A 12/19/2015   Procedure: CARDIOVERSION;  Surgeon: [name], MD;  Location: [facility] ENDOSCOPY;  Service: Cardiovascular;  Laterality: N/A;  . CARDIOVERSION N/A 03/24/2016   Procedure: CARDIOVERSION;  Surgeon: [name], MD;  Location: [place];  Service: Cardiovascular;  Laterality: N/A;  . CATARACT EXTRACTION, BILATERAL Bilateral   . CLIPPING OF ATRIAL APPENDAGE N/A 03/14/2015  Procedure: CLIPPING OF ATRIAL APPENDAGE;  Surgeon: [name], MD;  Location: [place];  Service: Open Heart Surgery;  Laterality: N/A;  . COLONOSCOPY WITH PROPOFOL N/A 12/08/2014   Procedure: COLONOSCOPY WITH PROPOFOL;  Surgeon: [name], MD;  Location: [facility] ENDOSCOPY;  Service: Endoscopy;  Laterality: N/A;  . CORONARY ARTERY BYPASS GRAFT N/A 03/14/2015   Procedure: CORONARY ARTERY BYPASS GRAFTING (CABG) x 4 (LIMA to LAD, SVG to DIAGONAL 2, SVG SEQUENTIALLY to OM1 and OM2);  Surgeon: [name], MD;  Location: [place];  Service: Open Heart Surgery;  Laterality: N/A;  . JOINT REPLACEMENT     Right knee  . Left knee arthroscopy    . Left rotato cuff    . TEE WITHOUT CARDIOVERSION N/A 03/14/2015   Procedure: TRANSESOPHAGEAL ECHOCARDIOGRAM (TEE);  Surgeon: [name], MD;  Location: [place];  Service: Open Heart Surgery;  Laterality: N/A;  . TEE WITHOUT CARDIOVERSION N/A 12/19/2015   Procedure: TRANSESOPHAGEAL ECHOCARDIOGRAM (TEE);  Surgeon: [name], MD;  Location: [place];  Service: Cardiovascular;  Laterality: N/A;  . TEE WITHOUT CARDIOVERSION N/A 03/24/2016   Procedure: TRANSESOPHAGEAL ECHOCARDIOGRAM (TEE);  Surgeon: [name], MD;  Location: [place];  Service: Cardiovascular;  Laterality: N/A;  . TONSILLECTOMY      Current Outpatient Prescriptions  Medication Sig Dispense Refill  . ALPRAZolam (XANAX) 0.25 MG tablet Take 1 tablet (0.25 mg total) by mouth daily as needed for anxiety. 30 tablet 2  . amiodarone (PACERONE) 200 MG tablet Take 200 mg by mouth daily.     [name] atorvastatin (LIPITOR) 10 MG tablet Take 1 tablet (10 mg total) by mouth daily. 90 tablet 3  . citalopram (CELEXA) 10 MG tablet Take 1 tablet (10 mg total) by mouth daily. 90 tablet 3  . diltiazem (CARDIZEM) 30 MG tablet Take one to two tablets (30 mg) by mouth every 8 hours as tolerated a systolic blood pressure (top number) > 100 90 tablet 3  . fish oil-omega-3 fatty acids 1000 MG capsule Take 1 g by mouth daily.     [name] HYDROcodone-acetaminophen (NORCO/VICODIN) 5-325 MG tablet Take 1 tablet by mouth every 6 (six) hours as needed for moderate pain (body pain). 90 tablet 0  . loratadine (CLARITIN) 10 MG tablet Take 10 mg by mouth daily.    . metoprolol (LOPRESSOR) 50 MG tablet 2 (two) times daily. Tablet 1 tablet by mouth in the AM and 1.5 tablet by mouth in the PM    . Multiple Vitamin (MULTIVITAMIN) tablet Take 1 tablet by mouth daily.      . psyllium (METAMUCIL) 58.6 % powder  Take 1 packet by mouth 3 (three) times daily.    . tamsulosin (FLOMAX) 0.4 MG CAPS capsule Take 0.4 mg by mouth 1 day or 1 dose.    . warfarin (COUMADIN) 5 MG tablet Take as directed by Coumadin clinic. 100 tablet 0   No current facility-administered medications for this visit.     Allergies  Allergen Reactions  . Amiodarone Hcl Other (See Comments)    REACTION: intolerance Patient reports photosensitivity  . Ace Inhibitors Cough    Review of Systems negative except from HPI and PMH  Physical Exam BP 110/84   Pulse (!) 110   Ht 6\' 1"  (1.854 m)   Wt 183 lb (83 kg)   SpO2 94%   BMI 24.14 kg/m  Well developed and nourished in no acute distress HENT normal Neck supple with JVP-9 Clear Regular rate and rhythm,  no murmurs or gallops Abd-soft with active BS No Clubbing cyanosis 1-2+ edema Skin-warm and dry A & Oriented  Grossly normal sensory and motor function   ECG demonstrates atrial flutter 2:1 Intervals -/14/ Right bundle branch block  Assessment and  Plan  Atrial fibrillation/flutter persistent  Sinus bradycardia  Hypertension  CAD s/p CABG   HFpEF     Without symptoms of ischemia  He is having persistent atrial flutter and has had recurrent atrial flutter despite cardioversion. He is discussed options of amiodarone and Augmentin versus discontinuation of dofetilide. We have also discussed AV nodal ablation and pacing. He would like to pursue the latter. His LV function was normal to/18 (TEE). We will plan dual-chamber pacing with possible His bundle location  For volume overload we will add furosemide 20 mg a day  INR today supratherapeutic. This is been addressed by the Coumadin clinic.  We spent more than 50% of our >25 min visit in face to face counseling regarding the above

## 2016-05-19 ENCOUNTER — Ambulatory Visit (INDEPENDENT_AMBULATORY_CARE_PROVIDER_SITE_OTHER): Payer: Medicare Other | Admitting: *Deleted

## 2016-05-19 DIAGNOSIS — Z5181 Encounter for therapeutic drug level monitoring: Secondary | ICD-10-CM | POA: Diagnosis not present

## 2016-05-19 DIAGNOSIS — I483 Typical atrial flutter: Secondary | ICD-10-CM

## 2016-05-19 LAB — POCT INR: INR: 1.7

## 2016-05-27 ENCOUNTER — Ambulatory Visit (INDEPENDENT_AMBULATORY_CARE_PROVIDER_SITE_OTHER): Payer: Medicare Other | Admitting: Internal Medicine

## 2016-05-27 ENCOUNTER — Encounter: Payer: Self-pay | Admitting: Internal Medicine

## 2016-05-27 VITALS — BP 108/68 | HR 102 | Ht 72.0 in | Wt 182.0 lb

## 2016-05-27 DIAGNOSIS — R739 Hyperglycemia, unspecified: Secondary | ICD-10-CM

## 2016-05-27 DIAGNOSIS — G8929 Other chronic pain: Secondary | ICD-10-CM | POA: Diagnosis not present

## 2016-05-27 DIAGNOSIS — R269 Unspecified abnormalities of gait and mobility: Secondary | ICD-10-CM

## 2016-05-27 DIAGNOSIS — M545 Low back pain: Secondary | ICD-10-CM

## 2016-05-27 NOTE — Assessment & Plan Note (Signed)
Lab Results  Component Value Date   HGBA1C 5.9 06/14/2015   stable overall by history and exam, recent data reviewed with pt, and pt to continue medical treatment as before,  to f/u any worsening symptoms or concerns

## 2016-05-27 NOTE — Assessment & Plan Note (Signed)
Denies worsening at this time, but possibly related to gait d/o, consider neuro referral, declines for now

## 2016-05-27 NOTE — Progress Notes (Signed)
Pre visit review using our clinic review tool, if applicable. No additional management support is needed unless otherwise documented below in the visit note. 

## 2016-05-27 NOTE — Progress Notes (Signed)
Subjective:    Patient ID: Jonathan Orozco, male    DOB: 09-26-1928, 83 y.o.   MRN: [id number]  HPI  Here with balance issue but o/w vague hx.  Walks in yard without recent falls but c/o overal less stamina and fatigue, Denies worsening depressive symptoms, suicidal ideation, or panic; has ongoing anxiety, not increased recently. No vertigo or dizziness.  No tremor or focal neuro defecits. Denies worsening depressive symptoms, suicidal ideation, or panic; has ongoing anxiety.  Has cane at home he does not use. Has had elevated HR is having ablation and PPM next Monday.  Pt denies chest pain, increased sob or doe, wheezing, orthopnea, PND, increased LE swelling, palpitations, dizziness or syncope.  Pt denies fever, wt loss, night sweats, loss of appetite, or other constitutional symptoms  Pt denies polydipsia, polyuria  Past Medical History:  Diagnosis Date  . ABUSE, ALCOHOL, IN REMISSION 04/08/2007  . ALLERGIC RHINITIS 09/29/2006  . Atrial flutter ([place]) 03/28/2010   a. s/p prior rfca;  b. recurrent paroxysmal flutter 04/2012;  c. pradaxa initiated 04/2012.  [name] BENIGN PROSTATIC HYPERTROPHY 09/29/2006  . BPPV (benign paroxysmal positional vertigo) 04/08/2007  . CAD (coronary artery disease)    a. LHC 2/17: EF 55-65%, LM 75, pLAD 75, oD1 75, oD2 65, D3 85, oLCx 99, oOM1 75, pRCA 25 >> CABG  . Carotid stenosis    a. Carotid US 2/17:  Bilateral ICA 1-39% ICA  . COLONIC POLYPS, HX OF 06/23/2007  . 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 Diverticulosis   . ERECTILE DYSFUNCTION 09/29/2006  . GLUCOSE INTOLERANCE 03/19/2010  . HYPERLIPIDEMIA 09/29/2006  . HYPERTENSION 09/29/2006  . Long term (current) use of anticoagulants 04/26/2010  . MELANOMA, MALIGNANT, SKIN NOS 09/29/2006   other skin cancers -no further melanoma  . OSTEOARTHROSIS NOS, OTHER [facility] SITE 09/29/2006  . Other specified forms of hearing loss 08/10/2009  . VENTRICULAR TACHYCARDIA 09/29/2006   Past Surgical History:  Procedure Laterality Date  .  CARDIAC CATHETERIZATION N/A 03/12/2015   Procedure: Left Heart Cath and Coronary Angiography;  Surgeon: [name], MD;  Location: [place] CV LAB;  Service: Cardiovascular;  Laterality: N/A;  . CARDIOVERSION N/A 12/19/2015   Procedure: CARDIOVERSION;  Surgeon: [name], MD;  Location: [facility] ENDOSCOPY;  Service: Cardiovascular;  Laterality: N/A;  . CARDIOVERSION N/A 03/24/2016   Procedure: CARDIOVERSION;  Surgeon: [name], MD;  Location: [place];  Service: Cardiovascular;  Laterality: N/A;  . CATARACT EXTRACTION, BILATERAL Bilateral   . CLIPPING OF ATRIAL APPENDAGE N/A 03/14/2015   Procedure: CLIPPING OF ATRIAL APPENDAGE;  Surgeon: [name], MD;  Location: [place];  Service: Open Heart Surgery;  Laterality: N/A;  . COLONOSCOPY WITH PROPOFOL N/A 12/08/2014   Procedure: COLONOSCOPY WITH PROPOFOL;  Surgeon: [name], MD;  Location: [facility] ENDOSCOPY;  Service: Endoscopy;  Laterality: N/A;  . CORONARY ARTERY BYPASS GRAFT N/A 03/14/2015   Procedure: CORONARY ARTERY BYPASS GRAFTING (CABG) x 4 (LIMA to LAD, SVG to DIAGONAL 2, SVG SEQUENTIALLY to OM1 and OM2);  Surgeon: [name], MD;  Location: [place];  Service: Open Heart Surgery;  Laterality: N/A;  . JOINT REPLACEMENT     Right knee  . Left knee arthroscopy    . Left rotato cuff    . TEE WITHOUT CARDIOVERSION N/A 03/14/2015   Procedure: TRANSESOPHAGEAL ECHOCARDIOGRAM (TEE);  Surgeon: [name], MD;  Location: [place];  Service: Open Heart Surgery;  Laterality: N/A;  . TEE WITHOUT CARDIOVERSION N/A 12/19/2015  Procedure: TRANSESOPHAGEAL ECHOCARDIOGRAM (TEE);  Surgeon: [name], MD;  Location: [place];  Service: Cardiovascular;  Laterality: N/A;  . TEE WITHOUT CARDIOVERSION N/A 03/24/2016   Procedure: TRANSESOPHAGEAL ECHOCARDIOGRAM (TEE);  Surgeon: [name], MD;  Location: [facility] ENDOSCOPY;  Service: Cardiovascular;  Laterality: N/A;  . TONSILLECTOMY      reports that he has quit smoking. He  has never used smokeless tobacco. He reports that he does not drink alcohol or use drugs. family history includes Diabetes in his brother; Esophageal cancer in his father. Allergies  Allergen Reactions  . Amiodarone Hcl Other (See Comments)    REACTION: intolerance Patient reports photosensitivity  . Ace Inhibitors Cough   Current Outpatient Prescriptions on File Prior to Visit  Medication Sig Dispense Refill  . amiodarone (PACERONE) 200 MG tablet Take 200 mg by mouth daily.     [name] atorvastatin (LIPITOR) 10 MG tablet Take 1 tablet (10 mg total) by mouth daily. 90 tablet 3  . diltiazem (CARDIZEM) 30 MG tablet Take one to two tablets (30 mg) by mouth every 8 hours as tolerated a systolic blood pressure (top number) > 100 90 tablet 3  . DOXYLAMINE SUCCINATE, SLEEP, PO Takes 1 tablet every night at HS    . fish oil-omega-3 fatty acids 1000 MG capsule Take 1 g by mouth daily.     . furosemide (LASIX) 20 MG tablet Take one tablet (20 mg) by mouth once daily x 7 days, then once daily as needed for swelling or shortness of breath 30 tablet 3  . HYDROcodone-acetaminophen (NORCO/VICODIN) 5-325 MG tablet Take 1 tablet by mouth every 6 (six) hours as needed for moderate pain (body pain). 90 tablet 0  . metoprolol (LOPRESSOR) 50 MG tablet 2 (two) times daily. Tablet 1 tablet by mouth in the AM and 1.5 tablet by mouth in the PM    . Multiple Vitamin (MULTIVITAMIN) tablet Take 1 tablet by mouth daily.      . psyllium (METAMUCIL) 58.6 % powder Take 1 packet by mouth 3 (three) times daily.    . tamsulosin (FLOMAX) 0.4 MG CAPS capsule Take 0.4 mg by mouth 1 day or 1 dose.    . warfarin (COUMADIN) 5 MG tablet Take as directed by Coumadin clinic. 100 tablet 0   No current facility-administered medications on file prior to visit.    Review of Systems  Constitutional: Negative for other unusual diaphoresis or sweats HENT: Negative for ear discharge or swelling Eyes: Negative for other worsening visual  disturbances Respiratory: Negative for stridor or other swelling  Gastrointestinal: Negative for worsening distension or other blood Genitourinary: Negative for retention or other urinary change Musculoskeletal: Negative for other MSK pain or swelling Skin: Negative for color change or other new lesions Neurological: Negative for worsening tremors and other numbness  Psychiatric/Behavioral: Negative for worsening agitation or other fatigue All other system neg per pt    Objective:   Physical Exam BP 108/68   Pulse (!) 102   Ht 6' (1.829 m)   Wt 182 lb (82.6 kg)   SpO2 98%   BMI 24.68 kg/m  VS noted,  Constitutional: Pt appears in NAD HENT: Head: NCAT.  Right Ear: External ear normal.  Left Ear: External ear normal.  Eyes: . Pupils are equal, round, and reactive to light. Conjunctivae and EOM are normal Nose: without d/c or deformity Neck: Neck supple. Gross normal ROM Cardiovascular: tachy rate and irregular rhythm.   Pulmonary/Chest: Effort normal and breath sounds without rales or  wheezing.  Abd:  Soft, NT, ND, + BS, no organomegaly Neurological: Pt is alert. At baseline orientation, motor grossly intact, gait somewhat unsteady Skin: Skin is warm. No rashes, other new lesions, no LE edema Psychiatric: Pt behavior is normal without agitation  No other exam findings  Lab Results  Component Value Date   WBC 9.1 03/11/2016   HGB 16.0 03/11/2016   HCT 47.7 03/11/2016   PLT 286 03/11/2016   GLUCOSE 141 (H) 03/11/2016   CHOL 123 06/14/2015   TRIG 47.0 06/14/2015   HDL 50.50 06/14/2015   LDLDIRECT 159.2 09/29/2006   LDLCALC 63 06/14/2015   ALT 23 03/11/2016   AST 26 03/11/2016   NA 135 03/11/2016   K 4.6 03/11/2016   CL 99 (L) 03/11/2016   CREATININE 1.07 03/11/2016   BUN 9 03/11/2016   CO2 26 03/11/2016   TSH 1.019 03/11/2016   PSA 1.96 03/16/2009   INR 1.7 05/19/2016   HGBA1C 5.9 06/14/2015        Assessment & Plan:

## 2016-05-27 NOTE — Assessment & Plan Note (Signed)
No obvious etiology o/w unclear, recent labs ok, does not appear to need PT, for cont'd cardiology care , and consider MRI head and neurology if not improved

## 2016-05-27 NOTE — Patient Instructions (Signed)
Please use your cane on a regular basis at least outside of the house  Please continue all other medications as before, and refills have been done if requested.  Please have the pharmacy call with any other refills you may need.  Please keep your appointments with your specialists as you may have planned - cardiology next week  We can consider Neurology referral if needed, if the cardiology treatment does not seem to help

## 2016-05-29 ENCOUNTER — Ambulatory Visit (INDEPENDENT_AMBULATORY_CARE_PROVIDER_SITE_OTHER): Payer: Medicare Other | Admitting: *Deleted

## 2016-05-29 ENCOUNTER — Other Ambulatory Visit: Payer: Medicare Other | Admitting: *Deleted

## 2016-05-29 DIAGNOSIS — I472 Ventricular tachycardia: Secondary | ICD-10-CM

## 2016-05-29 DIAGNOSIS — I2510 Atherosclerotic heart disease of native coronary artery without angina pectoris: Secondary | ICD-10-CM

## 2016-05-29 DIAGNOSIS — I483 Typical atrial flutter: Secondary | ICD-10-CM

## 2016-05-29 DIAGNOSIS — Z5181 Encounter for therapeutic drug level monitoring: Secondary | ICD-10-CM | POA: Diagnosis not present

## 2016-05-29 DIAGNOSIS — E875 Hyperkalemia: Secondary | ICD-10-CM

## 2016-05-29 DIAGNOSIS — I4729 Other ventricular tachycardia: Secondary | ICD-10-CM

## 2016-05-29 DIAGNOSIS — I208 Other forms of angina pectoris: Secondary | ICD-10-CM

## 2016-05-29 LAB — POCT INR: INR: 3.4

## 2016-05-29 NOTE — Addendum Note (Signed)
Addended by: Eulis Foster on: 05/29/2016 12:48 PM   Modules accepted: Orders

## 2016-05-30 ENCOUNTER — Other Ambulatory Visit: Payer: Medicare Other

## 2016-05-30 LAB — CBC WITH DIFFERENTIAL/PLATELET
Basophils Absolute: 0 10*3/uL (ref 0.0–0.2)
Basos: 0 %
EOS (ABSOLUTE): 0.2 10*3/uL (ref 0.0–0.4)
EOS: 3 %
HEMATOCRIT: 40.1 % (ref 37.5–51.0)
Hemoglobin: 13.5 g/dL (ref 13.0–17.7)
IMMATURE GRANS (ABS): 0 10*3/uL (ref 0.0–0.1)
IMMATURE GRANULOCYTES: 0 %
LYMPHS: 47 %
Lymphocytes Absolute: 2.9 10*3/uL (ref 0.7–3.1)
MCH: 31.2 pg (ref 26.6–33.0)
MCHC: 33.7 g/dL (ref 31.5–35.7)
MCV: 93 fL (ref 79–97)
MONOCYTES: 7 %
MONOS ABS: 0.5 10*3/uL (ref 0.1–0.9)
Neutrophils Absolute: 2.7 10*3/uL (ref 1.4–7.0)
Neutrophils: 43 %
Platelets: 233 10*3/uL (ref 150–379)
RBC: 4.33 x10E6/uL (ref 4.14–5.80)
RDW: 13.7 % (ref 12.3–15.4)
WBC: 6.3 10*3/uL (ref 3.4–10.8)

## 2016-05-30 LAB — BASIC METABOLIC PANEL
BUN/Creatinine Ratio: 17 (ref 10–24)
BUN: 14 mg/dL (ref 8–27)
CALCIUM: 8.9 mg/dL (ref 8.6–10.2)
CO2: 26 mmol/L (ref 18–29)
CREATININE: 0.83 mg/dL (ref 0.76–1.27)
Chloride: 99 mmol/L (ref 96–106)
GFR calc Af Amer: 92 mL/min/{1.73_m2} (ref >59)
GFR calc non Af Amer: 80 mL/min/{1.73_m2} (ref >59)
GLUCOSE: 124 mg/dL — AB (ref 65–99)
Potassium: 4.6 mmol/L (ref 3.5–5.2)
SODIUM: 138 mmol/L (ref 134–144)

## 2016-06-02 ENCOUNTER — Ambulatory Visit (HOSPITAL_COMMUNITY)
Admission: RE | Admit: 2016-06-02 | Discharge: 2016-06-03 | Disposition: A | Discharge status: Home / Self Care | Payer: Medicare Other | Source: Ambulatory Visit | Attending: Internal Medicine | Admitting: Internal Medicine

## 2016-06-02 ENCOUNTER — Ambulatory Visit (HOSPITAL_COMMUNITY)
Admission: RE | Disposition: A | Discharge status: Home / Self Care | Payer: Self-pay | Source: Ambulatory Visit | Attending: Internal Medicine

## 2016-06-02 ENCOUNTER — Encounter (HOSPITAL_COMMUNITY): Payer: Self-pay | Admitting: Internal Medicine

## 2016-06-02 ENCOUNTER — Ambulatory Visit (INDEPENDENT_AMBULATORY_CARE_PROVIDER_SITE_OTHER): Payer: Medicare Other | Admitting: *Deleted

## 2016-06-02 DIAGNOSIS — Z7901 Long term (current) use of anticoagulants: Secondary | ICD-10-CM | POA: Insufficient documentation

## 2016-06-02 DIAGNOSIS — E785 Hyperlipidemia, unspecified: Secondary | ICD-10-CM | POA: Insufficient documentation

## 2016-06-02 DIAGNOSIS — I4892 Unspecified atrial flutter: Secondary | ICD-10-CM | POA: Diagnosis present

## 2016-06-02 DIAGNOSIS — Z951 Presence of aortocoronary bypass graft: Secondary | ICD-10-CM | POA: Insufficient documentation

## 2016-06-02 DIAGNOSIS — N400 Enlarged prostate without lower urinary tract symptoms: Secondary | ICD-10-CM | POA: Insufficient documentation

## 2016-06-02 DIAGNOSIS — I480 Paroxysmal atrial fibrillation: Secondary | ICD-10-CM

## 2016-06-02 DIAGNOSIS — I2510 Atherosclerotic heart disease of native coronary artery without angina pectoris: Secondary | ICD-10-CM | POA: Insufficient documentation

## 2016-06-02 DIAGNOSIS — I5032 Chronic diastolic (congestive) heart failure: Secondary | ICD-10-CM | POA: Diagnosis not present

## 2016-06-02 DIAGNOSIS — I481 Persistent atrial fibrillation: Secondary | ICD-10-CM | POA: Diagnosis not present

## 2016-06-02 DIAGNOSIS — Z959 Presence of cardiac and vascular implant and graft, unspecified: Secondary | ICD-10-CM

## 2016-06-02 DIAGNOSIS — I6523 Occlusion and stenosis of bilateral carotid arteries: Secondary | ICD-10-CM | POA: Diagnosis not present

## 2016-06-02 DIAGNOSIS — Z950 Presence of cardiac pacemaker: Secondary | ICD-10-CM

## 2016-06-02 DIAGNOSIS — E7439 Other disorders of intestinal carbohydrate absorption: Secondary | ICD-10-CM | POA: Insufficient documentation

## 2016-06-02 DIAGNOSIS — M1990 Unspecified osteoarthritis, unspecified site: Secondary | ICD-10-CM | POA: Insufficient documentation

## 2016-06-02 DIAGNOSIS — Z5181 Encounter for therapeutic drug level monitoring: Secondary | ICD-10-CM | POA: Diagnosis not present

## 2016-06-02 DIAGNOSIS — I4891 Unspecified atrial fibrillation: Secondary | ICD-10-CM | POA: Diagnosis not present

## 2016-06-02 DIAGNOSIS — I110 Hypertensive heart disease with heart failure: Secondary | ICD-10-CM | POA: Insufficient documentation

## 2016-06-02 DIAGNOSIS — I483 Typical atrial flutter: Secondary | ICD-10-CM | POA: Diagnosis not present

## 2016-06-02 DIAGNOSIS — I472 Ventricular tachycardia: Secondary | ICD-10-CM | POA: Insufficient documentation

## 2016-06-02 DIAGNOSIS — H8110 Benign paroxysmal vertigo, unspecified ear: Secondary | ICD-10-CM | POA: Insufficient documentation

## 2016-06-02 HISTORY — PX: PACEMAKER IMPLANT: EP1218

## 2016-06-02 HISTORY — DX: Unspecified atrial fibrillation: I48.91

## 2016-06-02 HISTORY — PX: INSERT / REPLACE / REMOVE PACEMAKER: SUR710

## 2016-06-02 HISTORY — DX: Presence of cardiac pacemaker: Z95.0

## 2016-06-02 LAB — SURGICAL PCR SCREEN
MRSA, PCR: NEGATIVE
Staphylococcus aureus: NEGATIVE

## 2016-06-02 LAB — PROTIME-INR
INR: 2.75
PROTHROMBIN TIME: 29.6 s — AB (ref 11.4–15.2)

## 2016-06-02 LAB — POCT INR: INR: 3

## 2016-06-02 SURGERY — PACEMAKER IMPLANT

## 2016-06-02 MED ORDER — LIDOCAINE HCL (PF) 1 % IJ SOLN
INTRAMUSCULAR | Status: AC
  Filled 2016-06-02: qty 60

## 2016-06-02 MED ORDER — ONDANSETRON HCL 4 MG/2ML IJ SOLN: 4.0000 mg | Freq: Four times a day (QID) | INTRAMUSCULAR | Status: DC | PRN

## 2016-06-02 MED ORDER — HEPARIN (PORCINE) IN NACL 2-0.9 UNIT/ML-% IJ SOLN
INTRAMUSCULAR | Status: AC
  Filled 2016-06-02: qty 1000

## 2016-06-02 MED ORDER — ATORVASTATIN CALCIUM 10 MG PO TABS
10.0000 mg | ORAL_TABLET | Freq: Every day | ORAL | Status: DC
  Administered 2016-06-03: 09:00:00 10 mg via ORAL
  Filled 2016-06-02: qty 1

## 2016-06-02 MED ORDER — CEFAZOLIN SODIUM-DEXTROSE 2-4 GM/100ML-% IV SOLN
2.0000 g | INTRAVENOUS | Status: AC
  Administered 2016-06-02: 2 g via INTRAVENOUS

## 2016-06-02 MED ORDER — WARFARIN SODIUM 5 MG PO TABS: 2.5000 mg | ORAL_TABLET | ORAL | Status: DC

## 2016-06-02 MED ORDER — LIDOCAINE HCL (PF) 1 % IJ SOLN
INTRAMUSCULAR | Status: DC | PRN
  Administered 2016-06-02: 40 mL via INTRADERMAL

## 2016-06-02 MED ORDER — METOPROLOL TARTRATE 25 MG PO TABS
75.0000 mg | ORAL_TABLET | Freq: Every morning | ORAL | Status: DC
  Administered 2016-06-03: 75 mg via ORAL
  Filled 2016-06-02: qty 3

## 2016-06-02 MED ORDER — SODIUM CHLORIDE 0.9 % IV SOLN
INTRAVENOUS | Status: DC
  Administered 2016-06-02: 11:00:00 via INTRAVENOUS
  Administered 2016-06-02: 200 mL via INTRAVENOUS

## 2016-06-02 MED ORDER — HYDROCODONE-ACETAMINOPHEN 5-325 MG PO TABS: 1.0000 | ORAL_TABLET | Freq: Four times a day (QID) | ORAL | Status: DC | PRN

## 2016-06-02 MED ORDER — HEPARIN (PORCINE) IN NACL 2-0.9 UNIT/ML-% IJ SOLN
INTRAMUSCULAR | Status: DC | PRN
  Administered 2016-06-02: 12:00:00

## 2016-06-02 MED ORDER — WARFARIN - PHYSICIAN DOSING INPATIENT: Freq: Every day | Status: DC

## 2016-06-02 MED ORDER — TAMSULOSIN HCL 0.4 MG PO CAPS
0.4000 mg | ORAL_CAPSULE | Freq: Every day | ORAL | Status: DC
  Administered 2016-06-03: 09:00:00 0.4 mg via ORAL
  Filled 2016-06-02: qty 1

## 2016-06-02 MED ORDER — MIDAZOLAM HCL 5 MG/5ML IJ SOLN
INTRAMUSCULAR | Status: AC
  Filled 2016-06-02: qty 5

## 2016-06-02 MED ORDER — YOU HAVE A PACEMAKER BOOK
Freq: Once | Status: AC
  Administered 2016-06-02: 23:00:00
  Filled 2016-06-02: qty 1

## 2016-06-02 MED ORDER — DILTIAZEM HCL ER COATED BEADS 120 MG PO CP24
120.0000 mg | ORAL_CAPSULE | Freq: Every day | ORAL | Status: DC
  Administered 2016-06-02 – 2016-06-03 (×2): 120 mg via ORAL
  Filled 2016-06-02 (×2): qty 1

## 2016-06-02 MED ORDER — OFF THE BEAT BOOK
Freq: Once | Status: AC
  Administered 2016-06-02: 23:00:00
  Filled 2016-06-02: qty 1

## 2016-06-02 MED ORDER — METOPROLOL TARTRATE 25 MG PO TABS: 50.0000 mg | ORAL_TABLET | Freq: Every day | ORAL | Status: DC

## 2016-06-02 MED ORDER — METOPROLOL TARTRATE 25 MG PO TABS: 75.0000 mg | ORAL_TABLET | Freq: Every day | ORAL | Status: DC

## 2016-06-02 MED ORDER — AMIODARONE IV BOLUS ONLY 150 MG/100ML
150.0000 mg | Freq: Once | INTRAVENOUS | Status: DC
  Filled 2016-06-02: qty 100

## 2016-06-02 MED ORDER — ACETAMINOPHEN 325 MG PO TABS: 325.0000 mg | ORAL_TABLET | ORAL | Status: DC | PRN

## 2016-06-02 MED ORDER — FENTANYL CITRATE (PF) 100 MCG/2ML IJ SOLN
INTRAMUSCULAR | Status: DC | PRN
  Administered 2016-06-02 (×4): 25 ug via INTRAVENOUS

## 2016-06-02 MED ORDER — GENTAMICIN SULFATE 40 MG/ML IJ SOLN
INTRAMUSCULAR | Status: AC
  Filled 2016-06-02: qty 2

## 2016-06-02 MED ORDER — AMIODARONE HCL 200 MG PO TABS: 200.0000 mg | ORAL_TABLET | Freq: Every day | ORAL | Status: DC

## 2016-06-02 MED ORDER — CEFAZOLIN SODIUM-DEXTROSE 1-4 GM/50ML-% IV SOLN
1.0000 g | Freq: Four times a day (QID) | INTRAVENOUS | Status: AC
  Administered 2016-06-02 – 2016-06-03 (×3): 1 g via INTRAVENOUS
  Filled 2016-06-02 (×3): qty 50

## 2016-06-02 MED ORDER — OMEGA-3-ACID ETHYL ESTERS 1 G PO CAPS
1.0000 g | ORAL_CAPSULE | Freq: Every day | ORAL | Status: DC
  Administered 2016-06-02: 1 g via ORAL
  Filled 2016-06-02 (×2): qty 1

## 2016-06-02 MED ORDER — SODIUM CHLORIDE 0.9 % IR SOLN
80.0000 mg | Status: AC
  Administered 2016-06-02: 80 mg

## 2016-06-02 MED ORDER — FENTANYL CITRATE (PF) 100 MCG/2ML IJ SOLN
INTRAMUSCULAR | Status: AC
  Filled 2016-06-02: qty 2

## 2016-06-02 MED ORDER — MUPIROCIN 2 % EX OINT
TOPICAL_OINTMENT | CUTANEOUS | Status: AC
  Administered 2016-06-02: 10:00:00
  Filled 2016-06-02: qty 22

## 2016-06-02 MED ORDER — BUPIVACAINE HCL (PF) 0.25 % IJ SOLN
INTRAMUSCULAR | Status: AC
  Filled 2016-06-02: qty 30

## 2016-06-02 MED ORDER — MUPIROCIN 2 % EX OINT: 1.0000 "application " | TOPICAL_OINTMENT | Freq: Once | CUTANEOUS | Status: DC

## 2016-06-02 MED ORDER — WARFARIN SODIUM 5 MG PO TABS
5.0000 mg | ORAL_TABLET | ORAL | Status: DC
  Administered 2016-06-02: 5 mg via ORAL
  Filled 2016-06-02: qty 1

## 2016-06-02 MED ORDER — ADULT MULTIVITAMIN W/MINERALS CH
1.0000 | ORAL_TABLET | Freq: Every day | ORAL | Status: DC
  Administered 2016-06-03: 09:00:00 1 via ORAL
  Filled 2016-06-02 (×2): qty 1

## 2016-06-02 MED ORDER — HEPARIN (PORCINE) IN NACL 2-0.9 UNIT/ML-% IJ SOLN: INTRAMUSCULAR | Status: DC | PRN

## 2016-06-02 MED ORDER — METOPROLOL TARTRATE 25 MG PO TABS
50.0000 mg | ORAL_TABLET | Freq: Every day | ORAL | Status: DC
  Administered 2016-06-02: 50 mg via ORAL
  Filled 2016-06-02: qty 2

## 2016-06-02 MED ORDER — DILTIAZEM HCL ER COATED BEADS 120 MG PO CP24: 120.0000 mg | ORAL_CAPSULE | Freq: Every day | ORAL | Status: DC

## 2016-06-02 MED ORDER — CEFAZOLIN SODIUM-DEXTROSE 2-4 GM/100ML-% IV SOLN
INTRAVENOUS | Status: AC
  Filled 2016-06-02: qty 100

## 2016-06-02 MED ORDER — CHLORHEXIDINE GLUCONATE 4 % EX LIQD: 60.0000 mL | Freq: Once | CUTANEOUS | Status: DC

## 2016-06-02 MED ORDER — MIDAZOLAM HCL 5 MG/5ML IJ SOLN
INTRAMUSCULAR | Status: DC | PRN
  Administered 2016-06-02: 1 mg via INTRAVENOUS
  Administered 2016-06-02: 0.5 mg via INTRAVENOUS
  Administered 2016-06-02 (×2): 1 mg via INTRAVENOUS
  Administered 2016-06-02: 0.5 mg via INTRAVENOUS
  Administered 2016-06-02: 1 mg via INTRAVENOUS
  Administered 2016-06-02: 0.5 mg via INTRAVENOUS

## 2016-06-02 MED ORDER — SODIUM CHLORIDE 0.9 % IV SOLN
INTRAVENOUS | Status: DC
  Administered 2016-06-02: 11:00:00 via INTRAVENOUS

## 2016-06-02 SURGICAL SUPPLY — 13 items
CABLE SURGICAL S-101-97-12 (CABLE) ×6 IMPLANT
CATH RIGHTSITE C315HIS02 (CATHETERS) ×3 IMPLANT
GUIDEWIRE ANGLED .035X150CM (WIRE) ×3 IMPLANT
IPG PACE AZUR XT SR MRI W1SR01 (Pacemaker) ×1 IMPLANT
LEAD CAPSURE NOVUS 5076-58CM (Lead) ×3 IMPLANT
PACE AZURE XT SR MRI W1SR01 (Pacemaker) ×3 IMPLANT
PACK EP LATEX FREE (CUSTOM PROCEDURE TRAY)
PACK EP LF (CUSTOM PROCEDURE TRAY) IMPLANT
PAD DEFIB LIFELINK (PAD) ×3 IMPLANT
SHEATH CLASSIC 7F (SHEATH) ×3 IMPLANT
SHEATH PINNACLE 8F 10CM (SHEATH) IMPLANT
TRAY PACEMAKER INSERTION (PACKS) ×3 IMPLANT
WIRE HI TORQ VERSACORE-J 145CM (WIRE) ×3 IMPLANT

## 2016-06-02 NOTE — H&P (View-Only) (Signed)
Patient Care Team: Biagio Borg, MD as PCP - General   HPI  Jonathan Orozco is a 83 y.o. male Seen in followup for outflow tract ectopy and atrial flutters for which  he underwent EP testing and 3-D mapping which failed to produce stable arrhythmias; medical therapy was undertaken.  He has been intolerant of amiodarone. 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    His atrial arrhythmias have been managed with rate control and anticoagulation with warfarin. Previously he had been on NOACs but was intolerant     He was cardioverted twice last fall while on amio with recurrent atrial flutter-two different ones, one clearly not typical the other not clearly typical, but close.  Mostly assoc with RVR HR >110 (Personally reviewed  )   He was seen in the afib clinic and with some frustration ended up going to see Dr Judith Part who called me and encouraged the pt to followup here, with the caveat that we would minimize extender confusions  We discussed dofetilide and amio level 04/16/16  was obtained ( 0.9)    2/17 he presented with exertional chest discomfort and underwent catheterization.  1. LM lesion, 75% stenosed. 2. Ost Cx lesion, 99% stenosed. 3. Ost 1st Mrg to 1st Mrg lesion, 75% stenosed. 4. Prox LAD to Mid LAD lesion, 75% stenosed. 5. Ost 1st Diag lesion, 75% stenosed. 6. Ost 2nd Diag to 2nd Diag lesion, 65% stenosed.        3rd Diag lesion, 85% stenosed. LV function was normal. He underwent bypass surgery with LA clipping but no MAZE   He continues to have problems with exercise intolerance.  There is some peripheral edema.   5/17 LDL 63  Past Medical History:  Diagnosis Date  . ABUSE, ALCOHOL, IN REMISSION 04/08/2007  . ALLERGIC RHINITIS 09/29/2006  . Atrial flutter ([place]) 03/28/2010   a. s/p prior rfca;  b. recurrent paroxysmal flutter 04/2012;  c. pradaxa initiated 04/2012.  [name] BENIGN PROSTATIC HYPERTROPHY 09/29/2006  .  BPPV (benign paroxysmal positional vertigo) 04/08/2007  . CAD (coronary artery disease)    a. LHC 2/17: EF 55-65%, LM 75, pLAD 75, oD1 75, oD2 65, D3 85, oLCx 99, oOM1 75, pRCA 25 >> CABG  . Carotid stenosis    a. Carotid US 2/17:  Bilateral ICA 1-39% ICA  . COLONIC POLYPS, HX OF 06/23/2007  . 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 Diverticulosis   . ERECTILE DYSFUNCTION 09/29/2006  . GLUCOSE INTOLERANCE 03/19/2010  . HYPERLIPIDEMIA 09/29/2006  . HYPERTENSION 09/29/2006  . Long term (current) use of anticoagulants 04/26/2010  . MELANOMA, MALIGNANT, SKIN NOS 09/29/2006   other skin cancers -no further melanoma  . OSTEOARTHROSIS NOS, OTHER [facility] SITE 09/29/2006  . Other specified forms of hearing loss 08/10/2009  . VENTRICULAR TACHYCARDIA 09/29/2006    Past Surgical History:  Procedure Laterality Date  . CARDIAC CATHETERIZATION N/A 03/12/2015   Procedure: Left Heart Cath and Coronary Angiography;  Surgeon: [name], MD;  Location: [place] [facility] LAB;  Service: Cardiovascular;  Laterality: N/A;  . CARDIOVERSION N/A 12/19/2015   Procedure: CARDIOVERSION;  Surgeon: [name], MD;  Location: [facility] ENDOSCOPY;  Service: Cardiovascular;  Laterality: N/A;  . CARDIOVERSION N/A 03/24/2016   Procedure: CARDIOVERSION;  Surgeon: [name], MD;  Location: [place];  Service: Cardiovascular;  Laterality: N/A;  . CATARACT EXTRACTION, BILATERAL Bilateral   . CLIPPING OF ATRIAL APPENDAGE N/A 03/14/2015  Procedure: CLIPPING OF ATRIAL APPENDAGE;  Surgeon: [name], MD;  Location: [place];  Service: Open Heart Surgery;  Laterality: N/A;  . COLONOSCOPY WITH PROPOFOL N/A 12/08/2014   Procedure: COLONOSCOPY WITH PROPOFOL;  Surgeon: [name], MD;  Location: [facility] ENDOSCOPY;  Service: Endoscopy;  Laterality: N/A;  . CORONARY ARTERY BYPASS GRAFT N/A 03/14/2015   Procedure: CORONARY ARTERY BYPASS GRAFTING (CABG) x 4 (LIMA to LAD, SVG to DIAGONAL 2, SVG SEQUENTIALLY to OM1 and OM2);  Surgeon: [name], MD;  Location: [place];  Service: Open Heart Surgery;  Laterality: N/A;  . JOINT REPLACEMENT     Right knee  . Left knee arthroscopy    . Left rotato cuff    . TEE WITHOUT CARDIOVERSION N/A 03/14/2015   Procedure: TRANSESOPHAGEAL ECHOCARDIOGRAM (TEE);  Surgeon: [name], MD;  Location: [place];  Service: Open Heart Surgery;  Laterality: N/A;  . TEE WITHOUT CARDIOVERSION N/A 12/19/2015   Procedure: TRANSESOPHAGEAL ECHOCARDIOGRAM (TEE);  Surgeon: [name], MD;  Location: [place];  Service: Cardiovascular;  Laterality: N/A;  . TEE WITHOUT CARDIOVERSION N/A 03/24/2016   Procedure: TRANSESOPHAGEAL ECHOCARDIOGRAM (TEE);  Surgeon: [name], MD;  Location: [place];  Service: Cardiovascular;  Laterality: N/A;  . TONSILLECTOMY      Current Outpatient Prescriptions  Medication Sig Dispense Refill  . ALPRAZolam (XANAX) 0.25 MG tablet Take 1 tablet (0.25 mg total) by mouth daily as needed for anxiety. 30 tablet 2  . amiodarone (PACERONE) 200 MG tablet Take 200 mg by mouth daily.     [name] atorvastatin (LIPITOR) 10 MG tablet Take 1 tablet (10 mg total) by mouth daily. 90 tablet 3  . citalopram (CELEXA) 10 MG tablet Take 1 tablet (10 mg total) by mouth daily. 90 tablet 3  . diltiazem (CARDIZEM) 30 MG tablet Take one to two tablets (30 mg) by mouth every 8 hours as tolerated a systolic blood pressure (top number) > 100 90 tablet 3  . fish oil-omega-3 fatty acids 1000 MG capsule Take 1 g by mouth daily.     [name] HYDROcodone-acetaminophen (NORCO/VICODIN) 5-325 MG tablet Take 1 tablet by mouth every 6 (six) hours as needed for moderate pain (body pain). 90 tablet 0  . loratadine (CLARITIN) 10 MG tablet Take 10 mg by mouth daily.    . metoprolol (LOPRESSOR) 50 MG tablet 2 (two) times daily. Tablet 1 tablet by mouth in the AM and 1.5 tablet by mouth in the PM    . Multiple Vitamin (MULTIVITAMIN) tablet Take 1 tablet by mouth daily.      . psyllium (METAMUCIL) 58.6 % powder  Take 1 packet by mouth 3 (three) times daily.    . tamsulosin (FLOMAX) 0.4 MG CAPS capsule Take 0.4 mg by mouth 1 day or 1 dose.    . warfarin (COUMADIN) 5 MG tablet Take as directed by Coumadin clinic. 100 tablet 0   No current facility-administered medications for this visit.     Allergies  Allergen Reactions  . Amiodarone Hcl Other (See Comments)    REACTION: intolerance Patient reports photosensitivity  . Ace Inhibitors Cough    Review of Systems negative except from HPI and PMH  Physical Exam BP 110/84   Pulse (!) 110   Ht 6\' 1"  (1.854 m)   Wt 183 lb (83 kg)   SpO2 94%   BMI 24.14 kg/m  Well developed and nourished in no acute distress HENT normal Neck supple with JVP-9 Clear Regular rate and rhythm,  no murmurs or gallops Abd-soft with active BS No Clubbing cyanosis 1-2+ edema Skin-warm and dry A & Oriented  Grossly normal sensory and motor function   ECG demonstrates atrial flutter 2:1 Intervals -/14/ Right bundle branch block  Assessment and  Plan  Atrial fibrillation/flutter persistent  Sinus bradycardia  Hypertension  CAD s/p CABG   HFpEF     Without symptoms of ischemia  He is having persistent atrial flutter and has had recurrent atrial flutter despite cardioversion. He is discussed options of amiodarone and Augmentin versus discontinuation of dofetilide. We have also discussed AV nodal ablation and pacing. He would like to pursue the latter. His LV function was normal to/18 (TEE). We will plan dual-chamber pacing with possible His bundle location  For volume overload we will add furosemide 20 mg a day  INR today supratherapeutic. This is been addressed by the Coumadin clinic.  We spent more than 50% of our >25 min visit in face to face counseling regarding the above

## 2016-06-02 NOTE — Care Management Note (Signed)
Case Management Note  Patient Details  Name: Jonathan Orozco MRN: [id number] Date of Birth: 09-16-1928  Subjective/Objective:  s/p pace maker implant.                  Action/Plan: NCM will follow for dc needs.  Expected Discharge Date:                  Expected Discharge Plan:     In-House Referral:     Discharge planning Services  CM Consult  Post Acute Care Choice:    Choice offered to:     DME Arranged:    DME Agency:     HH Arranged:    HH Agency:     Status of Service:  In process, will continue to follow  If discussed at Long Length of Stay Meetings, dates discussed:    Additional Comments:  Zenon Mayo, RN 06/02/2016, 2:25 PM

## 2016-06-02 NOTE — Interval H&P Note (Signed)
History and Physical Interval Note:  06/02/2016 7:11 PM  Jonathan Orozco  has presented today for surgery, with the diagnosis of aflutter   The various methods of treatment have been discussed with the patient and family. After consideration of risks, benefits and other options for treatment, the patient has consented to  Procedure(s): Pacemaker Implant (N/A) as a surgical intervention .  The patient's history has been reviewed, patient examined, no change in status, stable for surgery.  I have reviewed the patient's chart and labs.  Questions were answered to the patient's satisfaction.     Jonathan Orozco  Still with weakness and now some balance issue INR 3.0 at office will recheck here and proceed if <2.9 With anticipation of AV ablation in a few weeks

## 2016-06-03 ENCOUNTER — Telehealth: Payer: Self-pay | Admitting: *Deleted

## 2016-06-03 ENCOUNTER — Ambulatory Visit (HOSPITAL_COMMUNITY): Payer: Medicare Other

## 2016-06-03 DIAGNOSIS — I5032 Chronic diastolic (congestive) heart failure: Secondary | ICD-10-CM | POA: Diagnosis not present

## 2016-06-03 DIAGNOSIS — I484 Atypical atrial flutter: Secondary | ICD-10-CM

## 2016-06-03 DIAGNOSIS — I481 Persistent atrial fibrillation: Secondary | ICD-10-CM | POA: Diagnosis not present

## 2016-06-03 DIAGNOSIS — I4892 Unspecified atrial flutter: Secondary | ICD-10-CM | POA: Diagnosis not present

## 2016-06-03 DIAGNOSIS — I110 Hypertensive heart disease with heart failure: Secondary | ICD-10-CM | POA: Diagnosis not present

## 2016-06-03 LAB — PROTIME-INR
INR: 2.81
Prothrombin Time: 30.1 seconds — ABNORMAL HIGH (ref 11.4–15.2)

## 2016-06-03 MED ORDER — DILTIAZEM HCL ER COATED BEADS 120 MG PO CP24: 120.0000 mg | ORAL_CAPSULE | Freq: Every day | ORAL | 6 refills | Status: DC

## 2016-06-03 MED FILL — Heparin Sodium (Porcine) 2 Unit/ML in Sodium Chloride 0.9%: INTRAMUSCULAR | Qty: 1000 | Status: AC

## 2016-06-03 MED FILL — Bupivacaine HCl Preservative Free (PF) Inj 0.25%: INTRAMUSCULAR | Qty: 30 | Status: AC

## 2016-06-03 NOTE — Discharge Summary (Signed)
ELECTROPHYSIOLOGY PROCEDURE DISCHARGE SUMMARY    Patient ID: Jonathan Orozco,  MRN: [id number], DOB/AGE: 81-May-1930 81 y.o.  Admit date: 06/02/2016 Discharge date: 06/03/2016  Primary Care Physician: [name], MD  Primary Cardiologist: Dr. Wynonia Lawman Electrophysiologist: Dr. [name]  Primary Discharge Diagnosis:  1. Atypical AFlutter difficult to control     CHA2DS2Vasc is 3, on warfarin, monitored and managed by coumadin clinic  Secondary Discharge Diagnosis:  1. CAD 2. HTN 3. HFpEF 4. VT  Allergies  Allergen Reactions  . Amiodarone Hcl Other (See Comments)    REACTION: intolerance Patient reports photosensitivity  . Ace Inhibitors Cough     Procedures This Admission:  1.  Implantation of a MDT single chamber PPM on 06/02/16 by Dr [name].  The patient received a Medtronic MRI compatible pulse generator serial number [id number],Medtronic MRI compatible ventricular lead serial number [id number]   There were no immediate post procedure complications. 2.  CXR on 06/03/16 demonstrated no pneumothorax status post device implantation.   Brief HPI: Jonathan Orozco is a 83 y.o. male was referred to electrophysiology in the outpatient setting for consideration of PPM implantation.  Past medical history includes CAD, AFlutter, HTN, HLD.  The patient has had symptomatic bradycardia without reversible causes identified.  Risks, benefits, and alternatives to PPM implantation were reviewed with the patient who wished to proceed.   Hospital Course:  The patient was admitted and underwent implantation of a PPM with details as outlined above.  He was monitored on telemetry overnight which demonstrated AFlutter, 80's-110 range.  Left chest was without hematoma or ecchymosis.  The device was interrogated and found to be functioning normally.  CXR was obtained and demonstrated no pneumothorax status post device implantation.  Wound care, arm mobility, and restrictions were reviewed with the patient.  The  patient was examined by Dr. [name] and considered stable for discharge to home.    Physical Exam: Vitals:   06/02/16 1600 06/02/16 1956 06/02/16 2258 06/03/16 0707  BP: 104/77 115/83  118/82  Pulse: (!) 111 (!) 109 (!) 106 (!) 109  Resp: 14 11  12   Temp:  97.5 F (36.4 C)  97.8 F (36.6 C)  TempSrc:  Oral  Oral  SpO2: 99% 100%  93%  Weight:    171 lb 15.3 oz (78 kg)  Height:        GEN- The patient is well appearing, alert and oriented x 3 today.   HEENT: normocephalic, atraumatic; sclera clear, conjunctiva pink; hearing intact; oropharynx clear; neck supple, no JVP Lungs- CTA b/l, normal work of breathing.  No wheezes, rales, rhonchi Heart- IRRR, no murmurs, rubs or gallops, PMI not laterally displaced GI- soft, non-tender, non-distended [place] without  hematoma, swelling or tenderness  Extremities- no clubbing, cyanosis, or edema MS- no significant deformity or atrophy Skin- warm and dry, no rash or lesion,  Psych- euthymic mood, full affect Neuro- no gross deficits   Labs:   Lab Results  Component Value Date   WBC 6.3 05/29/2016   HGB 16.0 03/11/2016   HCT 40.1 05/29/2016   MCV 93 05/29/2016   PLT 233 05/29/2016     Recent Labs Lab 05/29/16 1248  NA 138  K 4.6  CL 99  CO2 26  BUN 14  CREATININE 0.83  CALCIUM 8.9  GLUCOSE 124*    Discharge Medications:  Allergies as of 06/03/2016      Reactions   Amiodarone Hcl Other (See Comments)   REACTION: intolerance Patient reports photosensitivity  Ace Inhibitors Cough      Medication List    STOP taking these medications   amiodarone 200 MG tablet Commonly known as:  PACERONE   diltiazem 30 MG tablet Commonly known as:  CARDIZEM     TAKE these medications   atorvastatin 10 MG tablet Commonly known as:  LIPITOR Take 1 tablet (10 mg total) by mouth daily.   diltiazem 120 MG 24 hr capsule Commonly known as:  CARDIZEM CD Take 1 capsule (120 mg total) by mouth daily.   fish oil-omega-3 fatty acids  1000 MG capsule Take 1 g by mouth at bedtime.   furosemide 20 MG tablet Commonly known as:  LASIX Take one tablet (20 mg) by mouth once daily x 7 days, then once daily as needed for swelling or shortness of breath What changed:  how much to take  how to take this  when to take this  reasons to take this  additional instructions Notes to patient:  No changes are being made, continue your current regime   HYDROcodone-acetaminophen 5-325 MG tablet Commonly known as:  NORCO/VICODIN Take 1 tablet by mouth every 6 (six) hours as needed for moderate pain (body pain).   METAMUCIL PO Take 1 each by mouth daily. 1 TABLESPOON IN A GLASS OF WATER   metoprolol 50 MG tablet Commonly known as:  LOPRESSOR Take 50-75 mg by mouth 2 (two) times daily. Tablet 1 tablet by mouth in the AM and 1.5 tablet by mouth in the PM   multivitamin tablet Take 1 tablet by mouth daily.   tamsulosin 0.4 MG Caps capsule Commonly known as:  FLOMAX Take 0.4 mg by mouth daily.   UNISOM PO Take 1 tablet by mouth at bedtime.   warfarin 5 MG tablet Commonly known as:  COUMADIN Take as directed by Coumadin clinic. What changed:  how much to take  how to take this  when to take this  additional instructions Notes to patient:  No changes are being made, continue your current regime as instructed by coumadin clinic       Disposition:  Home Discharge Instructions    Diet - low sodium heart healthy    Complete by:  As directed    Increase activity slowly    Complete by:  As directed      Follow-up Information    [facility] Follow up on 06/12/2016.   Specialty:  Cardiology Why:  9:30AM, wound check 11:30: coumadin clinic/lab Contact information: [address], [address] [place]       Virl Axe, MD Follow up.   Specialty:  Cardiology Why:  You will be called by Dr. Olin Pia nurse to schedule your ablation procedure Contact  information: [address] [address] [phone]           Duration of Discharge Encounter: Greater than 30 minutes including physician time.  Signed, Tommye Standard, PA-C 06/03/2016 8:07 AM  Will  Discontinue amio with balance issues Continue metoprolol and now daily diltiazem  Will arrange AV ablation in the next few weeks

## 2016-06-03 NOTE — Telephone Encounter (Signed)
Pt was on TCM list admitted 06/02/16 for Atypical A-Flutter. Pt had a Implantation of a MDT single chamber PPM on 06/02/16 by Dr [name].  The patient received a Medtronic MRI compatible pulse generator serial number [id number],Medtronic MRI compatible ventricular lead serial number [id number]   There were no immediate post procedure complications, and CXR on 06/03/16 demonstrated no pneumothorax status post device implantation. Pt was D/c 5/1, and will follow-up w/cardiology on 06/12/16...Jonathan Orozco

## 2016-06-03 NOTE — Care Management Note (Signed)
Case Management Note  Patient Details  Name: SEQUOIA MINCEY MRN: [id number] Date of Birth: 1928/09/17  Subjective/Objective:    From home with spouse, pta indep,  s/p pace maker implant. For dc today, no needs.                Action/Plan:   Expected Discharge Date:  06/03/16               Expected Discharge Plan:  Home/Self Care  In-House Referral:     Discharge planning Services  CM Consult  Post Acute Care Choice:    Choice offered to:     DME Arranged:    DME Agency:     HH Arranged:    HH Agency:     Status of Service:  Completed, signed off  If discussed at [employer] of Stay Meetings, dates discussed:    Additional Comments:  Zenon Mayo, RN 06/03/2016, 9:16 AM

## 2016-06-03 NOTE — Discharge Instructions (Signed)
° ° °  Supplemental Discharge Instructions for  Pacemaker/Defibrillator Patients  Activity No heavy lifting or vigorous activity with your left/right arm for 6 to 8 weeks.  Do not raise your left/right arm above your head for one week.  Gradually raise your affected arm as drawn below.              06/06/16                      06/07/16                      06/08/16                     06/09/16 __  NO DRIVING for  1 week   ; you may begin driving on   03/10/47  .  WOUND CARE - Keep the wound area clean and dry.  Do not get this area wet, no showers for 24 hours; you may shower on  06/03/16 evening . - The tape/steri-strips on your wound will fall off; do not pull them off.  No bandage is needed on the site.  DO  NOT apply any creams, oils, or ointments to the wound area. - If you notice any drainage or discharge from the wound, any swelling or bruising at the site, or you develop a fever > 101? F after you are discharged home, call the office at once.  Special Instructions - You are still able to use cellular telephones; use the ear opposite the side where you have your pacemaker/defibrillator.  Avoid carrying your cellular phone near your device. - When traveling through airports, show security personnel your identification card to avoid being screened in the metal detectors.  Ask the security personnel to use the hand wand. - Avoid arc welding equipment, MRI testing (magnetic resonance imaging), TENS units (transcutaneous nerve stimulators).  Call the office for questions about other devices. - Avoid electrical appliances that are in poor condition or are not properly grounded. - Microwave ovens are safe to be near or to operate.  Additional information for defibrillator patients should your device go off: - If your device goes off ONCE and you feel fine afterward, notify the device clinic nurses. - If your device goes off ONCE and you do not feel well afterward, call 911. - If your device goes off  TWICE, call 911. - If your device goes off THREE times in one day, call 911.  DO NOT DRIVE YOURSELF OR A FAMILY MEMBER WITH A DEFIBRILLATOR TO THE HOSPITAL--CALL 911.

## 2016-06-06 ENCOUNTER — Telehealth: Payer: Self-pay | Admitting: Internal Medicine

## 2016-06-06 ENCOUNTER — Other Ambulatory Visit: Payer: Self-pay | Admitting: Internal Medicine

## 2016-06-06 NOTE — Telephone Encounter (Signed)
New Message  Pts wife voiced pt is in pain from yesterday procedure and wondering what he can take for pain.  Wound area looks good, no swelling, and not warm.  Certain positions he move to it feels as though it's hitting a nerve and need to know what to take for pain.  Please f/u

## 2016-06-06 NOTE — Telephone Encounter (Signed)
PT'S WIFE  AWARE THAT PT   MAY  TAKE  ES TYLENOL  AS  DIRECTED. PER  WIFE  SITE  LOOKS  GOOD  .Adonis Housekeeper

## 2016-06-12 ENCOUNTER — Telehealth: Payer: Self-pay

## 2016-06-12 ENCOUNTER — Ambulatory Visit (INDEPENDENT_AMBULATORY_CARE_PROVIDER_SITE_OTHER): Payer: Medicare Other | Admitting: Pharmacist

## 2016-06-12 ENCOUNTER — Other Ambulatory Visit (INDEPENDENT_AMBULATORY_CARE_PROVIDER_SITE_OTHER): Payer: Medicare Other

## 2016-06-12 ENCOUNTER — Ambulatory Visit (INDEPENDENT_AMBULATORY_CARE_PROVIDER_SITE_OTHER): Payer: Medicare Other | Admitting: *Deleted

## 2016-06-12 ENCOUNTER — Telehealth: Payer: Self-pay | Admitting: Internal Medicine

## 2016-06-12 DIAGNOSIS — Z1329 Encounter for screening for other suspected endocrine disorder: Secondary | ICD-10-CM | POA: Diagnosis not present

## 2016-06-12 DIAGNOSIS — Z5181 Encounter for therapeutic drug level monitoring: Secondary | ICD-10-CM | POA: Diagnosis not present

## 2016-06-12 DIAGNOSIS — Z0000 Encounter for general adult medical examination without abnormal findings: Secondary | ICD-10-CM

## 2016-06-12 DIAGNOSIS — I484 Atypical atrial flutter: Secondary | ICD-10-CM | POA: Diagnosis not present

## 2016-06-12 DIAGNOSIS — Z13220 Encounter for screening for lipoid disorders: Secondary | ICD-10-CM | POA: Diagnosis not present

## 2016-06-12 DIAGNOSIS — R001 Bradycardia, unspecified: Secondary | ICD-10-CM | POA: Diagnosis not present

## 2016-06-12 LAB — LIPID PANEL
CHOL/HDL RATIO: 2
Cholesterol: 111 mg/dL (ref 0–200)
HDL: 47.9 mg/dL (ref >39.00)
LDL CALC: 51 mg/dL (ref 0–99)
NonHDL: 63.23
Triglycerides: 59 mg/dL (ref 0.0–149.0)
VLDL: 11.8 mg/dL (ref 0.0–40.0)

## 2016-06-12 LAB — CUP PACEART INCLINIC DEVICE CHECK
Brady Statistic RV Percent Paced: 0.16 %
Implantable Lead Model: 5076
Lead Channel Impedance Value: 494 Ohm
Lead Channel Impedance Value: 741 Ohm
Lead Channel Sensing Intrinsic Amplitude: 7.625 mV
Lead Channel Setting Pacing Amplitude: 3.5 V
Lead Channel Setting Pacing Pulse Width: 0.4 ms
MDC IDC LEAD IMPLANT DT: 20180430
MDC IDC LEAD LOCATION: 753860
MDC IDC MSMT BATTERY REMAINING LONGEVITY: 190 mo
MDC IDC MSMT BATTERY VOLTAGE: 3.18 V
MDC IDC MSMT LEADCHNL RV PACING THRESHOLD AMPLITUDE: 0.75 V
MDC IDC MSMT LEADCHNL RV PACING THRESHOLD PULSEWIDTH: 0.4 ms
MDC IDC PG IMPLANT DT: 20180430
MDC IDC SESS DTM: 20180510135113
MDC IDC SET LEADCHNL RV SENSING SENSITIVITY: 0.9 mV

## 2016-06-12 LAB — CBC WITH DIFFERENTIAL/PLATELET
Basophils Absolute: 0 10*3/uL (ref 0.0–0.1)
Basophils Relative: 0.3 % (ref 0.0–3.0)
EOS PCT: 2.7 % (ref 0.0–5.0)
Eosinophils Absolute: 0.2 10*3/uL (ref 0.0–0.7)
HEMATOCRIT: 42.1 % (ref 39.0–52.0)
HEMOGLOBIN: 14.5 g/dL (ref 13.0–17.0)
LYMPHS ABS: 2.9 10*3/uL (ref 0.7–4.0)
LYMPHS PCT: 41.9 % (ref 12.0–46.0)
MCHC: 34.4 g/dL (ref 30.0–36.0)
MCV: 94.6 fl (ref 78.0–100.0)
MONOS PCT: 10.7 % (ref 3.0–12.0)
Monocytes Absolute: 0.7 10*3/uL (ref 0.1–1.0)
Neutro Abs: 3 10*3/uL (ref 1.4–7.7)
Neutrophils Relative %: 44.4 % (ref 43.0–77.0)
Platelets: 256 10*3/uL (ref 150.0–400.0)
RBC: 4.45 Mil/uL (ref 4.22–5.81)
RDW: 13.9 % (ref 11.5–15.5)
WBC: 6.8 10*3/uL (ref 4.0–10.5)

## 2016-06-12 LAB — BASIC METABOLIC PANEL
BUN: 12 mg/dL (ref 6–23)
CO2: 29 mEq/L (ref 19–32)
Calcium: 9.1 mg/dL (ref 8.4–10.5)
Chloride: 98 mEq/L (ref 96–112)
Creatinine, Ser: 0.91 mg/dL (ref 0.40–1.50)
GFR: 83.89 mL/min (ref >60.00)
GLUCOSE: 104 mg/dL — AB (ref 70–99)
POTASSIUM: 4.3 meq/L (ref 3.5–5.1)
Sodium: 135 mEq/L (ref 135–145)

## 2016-06-12 LAB — URINALYSIS, ROUTINE W REFLEX MICROSCOPIC
Bilirubin Urine: NEGATIVE
Hgb urine dipstick: NEGATIVE
Ketones, ur: NEGATIVE
Leukocytes, UA: NEGATIVE
Nitrite: NEGATIVE
Specific Gravity, Urine: 1.015 (ref 1.000–1.030)
TOTAL PROTEIN, URINE-UPE24: NEGATIVE
Urine Glucose: NEGATIVE
Urobilinogen, UA: 0.2 (ref 0.0–1.0)
pH: 6 (ref 5.0–8.0)

## 2016-06-12 LAB — TSH: TSH: 0.82 u[IU]/mL (ref 0.35–4.50)

## 2016-06-12 LAB — POCT INR: INR: 3.2

## 2016-06-12 LAB — PSA: PSA: 3.75 ng/mL (ref 0.10–4.00)

## 2016-06-12 NOTE — Telephone Encounter (Signed)
Lab called and needed CPE orders put in.

## 2016-06-12 NOTE — Progress Notes (Signed)
Wound check appointment. Dermabond removed. Wound without redness or edema. Incision edges approximated, wound well healed. Normal device function. Thresholds, sensing, and impedances consistent with implant measurements. Device programmed at 3.5V for extra safety margin until 3 month visit. Histogram distribution appropriate for patient and level of activity. No mode switches or high ventricular rates noted. Patient educated about wound care, arm mobility, lifting restrictions. ROV with SK 7/26.

## 2016-06-12 NOTE — Telephone Encounter (Signed)
I called and spoke with the patient's wife. I informed Mrs. Cratty that the patient is scheduled for 07/02/16 for his AV node ablation.  He will need to arrive at 12:30 pm for a 2:30 pm case. He is getting his coumadin checked weekly starting today. I advised her I will schedule all of his pre-procedure labs for the week of 06/23/16 (the day he has his coumadin checked that day). She voices understanding.

## 2016-06-18 ENCOUNTER — Encounter: Payer: Self-pay | Admitting: *Deleted

## 2016-06-19 ENCOUNTER — Encounter: Payer: Medicare Other | Admitting: Internal Medicine

## 2016-06-19 ENCOUNTER — Encounter: Payer: Self-pay | Admitting: Internal Medicine

## 2016-06-19 ENCOUNTER — Encounter: Payer: Self-pay | Admitting: *Deleted

## 2016-06-19 ENCOUNTER — Ambulatory Visit (INDEPENDENT_AMBULATORY_CARE_PROVIDER_SITE_OTHER): Payer: Medicare Other | Admitting: Internal Medicine

## 2016-06-19 DIAGNOSIS — R972 Elevated prostate specific antigen [PSA]: Secondary | ICD-10-CM | POA: Diagnosis not present

## 2016-06-19 DIAGNOSIS — Z23 Encounter for immunization: Secondary | ICD-10-CM | POA: Diagnosis not present

## 2016-06-19 DIAGNOSIS — M545 Low back pain: Secondary | ICD-10-CM | POA: Diagnosis not present

## 2016-06-19 DIAGNOSIS — Z0000 Encounter for general adult medical examination without abnormal findings: Secondary | ICD-10-CM

## 2016-06-19 DIAGNOSIS — Z0001 Encounter for general adult medical examination with abnormal findings: Secondary | ICD-10-CM | POA: Insufficient documentation

## 2016-06-19 DIAGNOSIS — E785 Hyperlipidemia, unspecified: Secondary | ICD-10-CM

## 2016-06-19 DIAGNOSIS — G8929 Other chronic pain: Secondary | ICD-10-CM

## 2016-06-19 MED ORDER — ATORVASTATIN CALCIUM 10 MG PO TABS: 10.0000 mg | ORAL_TABLET | Freq: Every day | ORAL | 3 refills | Status: DC

## 2016-06-19 MED ORDER — DILTIAZEM HCL ER COATED BEADS 120 MG PO CP24: 120.0000 mg | ORAL_CAPSULE | Freq: Every day | ORAL | 3 refills | Status: DC

## 2016-06-19 MED ORDER — HYDROCODONE-ACETAMINOPHEN 5-325 MG PO TABS: 1.0000 | ORAL_TABLET | Freq: Every day | ORAL | 0 refills | Status: DC | PRN

## 2016-06-19 MED ORDER — TAMSULOSIN HCL 0.4 MG PO CAPS: 0.4000 mg | ORAL_CAPSULE | Freq: Every day | ORAL | 3 refills | Status: DC

## 2016-06-19 MED ORDER — METOPROLOL TARTRATE 50 MG PO TABS: 25.0000 mg | ORAL_TABLET | Freq: Two times a day (BID) | ORAL | 3 refills | Status: DC

## 2016-06-19 NOTE — Assessment & Plan Note (Signed)
Pt has been reqeusting this despite advanced age, ok for f/u psa in 6 mo to determine any trend upwards

## 2016-06-19 NOTE — Assessment & Plan Note (Signed)

## 2016-06-19 NOTE — Assessment & Plan Note (Signed)
stable overall by history and exam, recent data reviewed with pt, and pt to continue medical treatment as before,  to f/u any worsening symptoms or concerns Lab Results  Component Value Date   LDLCALC 51 06/12/2016

## 2016-06-19 NOTE — Assessment & Plan Note (Signed)
stable overall by history and exam, recent data reviewed with pt, and pt to continue medical treatment as before,  to f/u any worsening symptoms or concerns, for med refills today

## 2016-06-19 NOTE — Patient Instructions (Addendum)
You had the Tdap tetanus shot otday  Please continue all other medications as before, and refills have been done if requested.  Please have the pharmacy call with any other refills you may need.  Please continue your efforts at being more active, low cholesterol diet, and weight control.  You are otherwise up to date with prevention measures today.  Please keep your appointments with your specialists as you may have planned  Please return in 6 months, or sooner if needed, with Lab testing done 3-5 days before

## 2016-06-19 NOTE — Progress Notes (Signed)
Subjective:    Patient ID: Jonathan Orozco, male    DOB: 1928-12-23, 83 y.o.   MRN: [id number]  HPI  Here for wellness and f/u;  Overall doing ok; just wating for planned AVN ablation later this month,  Has had some increased tachycardia in the past 1-2 wks , but  Pt denies Chest pain, worsening SOB, DOE, wheezing, orthopnea, PND, worsening LE edema, palpitations, dizziness or syncope.  Pt denies neurological change such as new headache, facial or extremity weakness.  Pt denies polydipsia, polyuria, or low sugar symptoms. Pt states overall good compliance with treatment and medications, good tolerability, and has been trying to follow appropriate diet.  Pt denies worsening depressive symptoms, suicidal ideation or panic. No fever, night sweats, wt loss, loss of appetite, or other constitutional symptoms.  Pt states good ability with ADL's, has low fall risk, home safety reviewed and adequate, no other significant changes in hearing or vision, and no longer active with exercise.  Used to walk 2 miles per day. Has chronic pain mult OA joints, also evidence for pior t2 compression fx by recent cxr.  Denies specific thoracic pain.  No new complaints.  I agree with wife he should walk at least for now with cane on the driveway with the mild grade Past Medical History:  Diagnosis Date  . ABUSE, ALCOHOL, IN REMISSION 04/08/2007  . ALLERGIC RHINITIS 09/29/2006  . Atrial fibrillation ([place])   . Atrial flutter ([place]) 03/28/2010   a. s/p prior rfca;  b. recurrent paroxysmal flutter 04/2012;  c. pradaxa initiated 04/2012.  [name] BENIGN PROSTATIC HYPERTROPHY 09/29/2006  . BPPV (benign paroxysmal positional vertigo) 04/08/2007  . CAD (coronary artery disease)    a. LHC 2/17: EF 55-65%, LM 75, pLAD 75, oD1 75, oD2 65, D3 85, oLCx 99, oOM1 75, pRCA 25 >> CABG  . Carotid stenosis    a. Carotid US 2/17:  Bilateral ICA 1-39% ICA  . COLONIC POLYPS, HX OF 06/23/2007  . 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 Diverticulosis   .  ERECTILE DYSFUNCTION 09/29/2006  . GLUCOSE INTOLERANCE 03/19/2010  . HYPERLIPIDEMIA 09/29/2006  . HYPERTENSION 09/29/2006  . Long term (current) use of anticoagulants 04/26/2010  . MELANOMA, MALIGNANT, SKIN NOS 09/29/2006   other skin cancers -no further melanoma  . OSTEOARTHROSIS NOS, OTHER [facility] SITE 09/29/2006  . Other specified forms of hearing loss 08/10/2009  . Presence of permanent cardiac pacemaker 06/02/2016  . VENTRICULAR TACHYCARDIA 09/29/2006   Past Surgical History:  Procedure Laterality Date  . CARDIAC CATHETERIZATION N/A 03/12/2015   Procedure: Left Heart Cath and Coronary Angiography;  Surgeon: [name], MD;  Location: [place] CV LAB;  Service: Cardiovascular;  Laterality: N/A;  . CARDIOVERSION N/A 12/19/2015   Procedure: CARDIOVERSION;  Surgeon: [name], MD;  Location: [facility] ENDOSCOPY;  Service: Cardiovascular;  Laterality: N/A;  . CARDIOVERSION N/A 03/24/2016   Procedure: CARDIOVERSION;  Surgeon: [name], MD;  Location: [place];  Service: Cardiovascular;  Laterality: N/A;  . CATARACT EXTRACTION, BILATERAL Bilateral   . CLIPPING OF ATRIAL APPENDAGE N/A 03/14/2015   Procedure: CLIPPING OF ATRIAL APPENDAGE;  Surgeon: [name], MD;  Location: [place];  Service: Open Heart Surgery;  Laterality: N/A;  . COLONOSCOPY WITH PROPOFOL N/A 12/08/2014   Procedure: COLONOSCOPY WITH PROPOFOL;  Surgeon: [name], MD;  Location: [facility] ENDOSCOPY;  Service: Endoscopy;  Laterality: N/A;  . CORONARY ARTERY BYPASS GRAFT N/A 03/14/2015   Procedure: CORONARY ARTERY BYPASS GRAFTING (CABG) x 4 (  LIMA to LAD, SVG to DIAGONAL 2, SVG SEQUENTIALLY to OM1 and OM2);  Surgeon: [name], MD;  Location: [place];  Service: Open Heart Surgery;  Laterality: N/A;  . INSERT / REPLACE / REMOVE PACEMAKER  06/02/2016  . JOINT REPLACEMENT     Right knee  . Left knee arthroscopy    . Left rotato cuff    . PACEMAKER IMPLANT N/A 06/02/2016   Procedure: Pacemaker Implant;  Surgeon:  Deboraha Sprang, MD;  Location: [place] CV LAB;  Service: Cardiovascular;  Laterality: N/A;  . TEE WITHOUT CARDIOVERSION N/A 03/14/2015   Procedure: TRANSESOPHAGEAL ECHOCARDIOGRAM (TEE);  Surgeon: [name], MD;  Location: [place];  Service: Open Heart Surgery;  Laterality: N/A;  . TEE WITHOUT CARDIOVERSION N/A 12/19/2015   Procedure: TRANSESOPHAGEAL ECHOCARDIOGRAM (TEE);  Surgeon: [name], MD;  Location: [place];  Service: Cardiovascular;  Laterality: N/A;  . TEE WITHOUT CARDIOVERSION N/A 03/24/2016   Procedure: TRANSESOPHAGEAL ECHOCARDIOGRAM (TEE);  Surgeon: [name], MD;  Location: [facility] ENDOSCOPY;  Service: Cardiovascular;  Laterality: N/A;  . TONSILLECTOMY      reports that he has quit smoking. He has never used smokeless tobacco. He reports that he does not drink alcohol or use drugs. family history includes Diabetes in his brother; Esophageal cancer in his father. Allergies  Allergen Reactions  . Amiodarone Hcl Other (See Comments)    REACTION: intolerance Patient reports photosensitivity  . Ace Inhibitors Cough   Current Outpatient Prescriptions on File Prior to Visit  Medication Sig Dispense Refill  . Doxylamine Succinate, Sleep, (UNISOM PO) Take 1 tablet by mouth at bedtime.    . fish oil-omega-3 fatty acids 1000 MG capsule Take 1 g by mouth at bedtime.     . furosemide (LASIX) 20 MG tablet Take one tablet (20 mg) by mouth once daily x 7 days, then once daily as needed for swelling or shortness of breath (Patient taking differently: Take 20 mg by mouth daily as needed for fluid. Take one tablet (20 mg) by mouth once daily x 7 days, then once daily as needed for swelling or shortness of breath) 30 tablet 3  . Multiple Vitamin (MULTIVITAMIN) tablet Take 1 tablet by mouth daily.      . Psyllium (METAMUCIL PO) Take 1 each by mouth daily. 1 TABLESPOON IN A GLASS OF WATER    . warfarin (COUMADIN) 5 MG tablet Take as directed by Coumadin clinic. (Patient taking  differently: Take 2.5-5 mg by mouth daily. Take as directed by Coumadin clinic. TAKES 1 TABLET ON SUN, MON, WED AND FRI  TAKES 1/2 TAB ON TUES, THURS, AND Saturday) 100 tablet 0   No current facility-administered medications on file prior to visit.    Review of Systems  Constitutional: Negative for other unusual diaphoresis, sweats, appetite or weight changes HENT: Negative for other worsening hearing loss, ear pain, facial swelling, mouth sores or neck stiffness.   Eyes: Negative for other worsening pain, redness or other visual disturbance.  Respiratory: Negative for other stridor or swelling Cardiovascular: Negative for other palpitations or other chest pain  Gastrointestinal: Negative for worsening diarrhea or loose stools, blood in stool, distention or other pain Genitourinary: Negative for hematuria, flank pain or other change in urine volume.  Musculoskeletal: Negative for myalgias or other joint swelling.  Skin: Negative for other color change, or other wound or worsening drainage.  Neurological: Negative for other syncope or numbness. Hematological: Negative for other adenopathy or swelling Psychiatric/Behavioral: Negative for hallucinations,  other worsening agitation, SI, self-injury, or new decreased concentration All other system neg per pt     Objective:   Physical Exam BP 104/76   Pulse (!) 127   Ht 6' (1.829 m)   Wt 179 lb (81.2 kg)   SpO2 99%   BMI 24.28 kg/m  VS noted, not ill appearing, able to get up on exam table Constitutional: Pt is oriented to person, place, and time. Appears well-developed and well-nourished, in no significant distress and comfortable Head: Normocephalic and atraumatic  Eyes: Conjunctivae and EOM are normal. Pupils are equal, round, and reactive to light Right Ear: External ear normal without discharge Left Ear: External ear normal without discharge Nose: Nose without discharge or deformity Mouth/Throat: Oropharynx is without other  ulcerations and moist  Neck: Normal range of motion. Neck supple. No JVD present. No tracheal deviation present or significant neck LA or mass Cardiovascular: Tchy rate, irregular rhythm, normal heart sounds and intact distal pulses.   Pulmonary/Chest: WOB normal and breath sounds without rales or wheezing  Abdominal: Soft. Bowel sounds are normal. NT. No HSM  Musculoskeletal: Normal range of motion. Exhibits no edema Lymphadenopathy: Has no other cervical adenopathy.  Neurological: Pt is alert and oriented to person, place, and time. Pt has normal reflexes. No cranial nerve deficit. Motor grossly intact, Gait intact, has severe HOH and left ear aid Skin: Skin is warm and dry. No rash noted or new ulcerations Psychiatric:  Has normal mood and affect. Behavior is normal without agitation No other exam findings  Lab Results  Component Value Date   WBC 6.8 06/12/2016   HGB 14.5 06/12/2016   HCT 42.1 06/12/2016   PLT 256.0 06/12/2016   GLUCOSE 104 (H) 06/12/2016   CHOL 111 06/12/2016   TRIG 59.0 06/12/2016   HDL 47.90 06/12/2016   LDLDIRECT 159.2 09/29/2006   LDLCALC 51 06/12/2016   ALT 23 03/11/2016   AST 26 03/11/2016   NA 135 06/12/2016   K 4.3 06/12/2016   CL 98 06/12/2016   CREATININE 0.91 06/12/2016   BUN 12 06/12/2016   CO2 29 06/12/2016   TSH 0.82 06/12/2016   PSA 3.75 06/12/2016   INR 3.2 06/12/2016   HGBA1C 5.9 06/14/2015       Assessment & Plan:

## 2016-06-19 NOTE — Addendum Note (Signed)
Addended by: Juliet Rude on: 06/19/2016 01:58 PM   Modules accepted: Orders

## 2016-06-20 ENCOUNTER — Ambulatory Visit (INDEPENDENT_AMBULATORY_CARE_PROVIDER_SITE_OTHER): Payer: Medicare Other | Admitting: *Deleted

## 2016-06-20 DIAGNOSIS — I484 Atypical atrial flutter: Secondary | ICD-10-CM | POA: Diagnosis not present

## 2016-06-20 DIAGNOSIS — Z5181 Encounter for therapeutic drug level monitoring: Secondary | ICD-10-CM | POA: Diagnosis not present

## 2016-06-20 LAB — POCT INR: INR: 3

## 2016-06-27 ENCOUNTER — Other Ambulatory Visit: Payer: Medicare Other | Admitting: *Deleted

## 2016-06-27 ENCOUNTER — Ambulatory Visit (INDEPENDENT_AMBULATORY_CARE_PROVIDER_SITE_OTHER): Payer: Medicare Other | Admitting: Pharmacist

## 2016-06-27 ENCOUNTER — Other Ambulatory Visit: Payer: Self-pay | Admitting: *Deleted

## 2016-06-27 DIAGNOSIS — I480 Paroxysmal atrial fibrillation: Secondary | ICD-10-CM

## 2016-06-27 DIAGNOSIS — I2510 Atherosclerotic heart disease of native coronary artery without angina pectoris: Secondary | ICD-10-CM

## 2016-06-27 DIAGNOSIS — Z5181 Encounter for therapeutic drug level monitoring: Secondary | ICD-10-CM

## 2016-06-27 DIAGNOSIS — I483 Typical atrial flutter: Secondary | ICD-10-CM

## 2016-06-27 DIAGNOSIS — I484 Atypical atrial flutter: Secondary | ICD-10-CM | POA: Diagnosis not present

## 2016-06-27 DIAGNOSIS — I4729 Other ventricular tachycardia: Secondary | ICD-10-CM

## 2016-06-27 DIAGNOSIS — I472 Ventricular tachycardia: Secondary | ICD-10-CM

## 2016-06-27 LAB — POCT INR: INR: 3.4

## 2016-06-28 LAB — BASIC METABOLIC PANEL
BUN/Creatinine Ratio: 10 (ref 10–24)
BUN: 9 mg/dL (ref 8–27)
CALCIUM: 9.1 mg/dL (ref 8.6–10.2)
CHLORIDE: 101 mmol/L (ref 96–106)
CO2: 23 mmol/L (ref 18–29)
Creatinine, Ser: 0.89 mg/dL (ref 0.76–1.27)
GFR calc Af Amer: 89 mL/min/{1.73_m2} (ref >59)
GFR calc non Af Amer: 77 mL/min/{1.73_m2} (ref >59)
Glucose: 129 mg/dL — ABNORMAL HIGH (ref 65–99)
POTASSIUM: 4.6 mmol/L (ref 3.5–5.2)
Sodium: 139 mmol/L (ref 134–144)

## 2016-06-28 LAB — CBC WITH DIFFERENTIAL/PLATELET
Basophils Absolute: 0 10*3/uL (ref 0.0–0.2)
Basos: 0 %
EOS (ABSOLUTE): 0.2 10*3/uL (ref 0.0–0.4)
Eos: 3 %
HEMOGLOBIN: 13.8 g/dL (ref 13.0–17.7)
Hematocrit: 41.1 % (ref 37.5–51.0)
IMMATURE GRANS (ABS): 0 10*3/uL (ref 0.0–0.1)
Immature Granulocytes: 0 %
LYMPHS: 46 %
Lymphocytes Absolute: 2.7 10*3/uL (ref 0.7–3.1)
MCH: 31.4 pg (ref 26.6–33.0)
MCHC: 33.6 g/dL (ref 31.5–35.7)
MCV: 94 fL (ref 79–97)
Monocytes Absolute: 0.5 10*3/uL (ref 0.1–0.9)
Monocytes: 8 %
NEUTROS ABS: 2.6 10*3/uL (ref 1.4–7.0)
NEUTROS PCT: 43 %
Platelets: 254 10*3/uL (ref 150–379)
RBC: 4.39 x10E6/uL (ref 4.14–5.80)
RDW: 14.1 % (ref 12.3–15.4)
WBC: 5.9 10*3/uL (ref 3.4–10.8)

## 2016-07-01 ENCOUNTER — Telehealth: Payer: Self-pay | Admitting: Cardiology

## 2016-07-01 NOTE — Telephone Encounter (Signed)
Pt wife called and stated that pt is very anxious that something is wrong with his device, and he has had some pain at device site. But device site is not swollen or hot. She wanted to know if pt could come to office to have device checked. Instructed pt wife to send remote transmission and the function of his device can be checked remotely.

## 2016-07-01 NOTE — Telephone Encounter (Signed)
Transmission received- HR ~120bpm, scheduled for AF/AFlutter ablation tomorrow. Normal device function. Wife cannot describe pain that [name] c/o earlier. She reports that he is very anxious about the procedure tomorrow.

## 2016-07-02 ENCOUNTER — Encounter (HOSPITAL_COMMUNITY)
Admission: RE | Disposition: A | Discharge status: Home / Self Care | Payer: Self-pay | Source: Ambulatory Visit | Attending: Internal Medicine

## 2016-07-02 ENCOUNTER — Observation Stay (HOSPITAL_COMMUNITY)
Admission: RE | Admit: 2016-07-02 | Discharge: 2016-07-03 | Disposition: A | Discharge status: Home / Self Care | Payer: Medicare Other | Source: Ambulatory Visit | Attending: Internal Medicine | Admitting: Internal Medicine

## 2016-07-02 ENCOUNTER — Ambulatory Visit (INDEPENDENT_AMBULATORY_CARE_PROVIDER_SITE_OTHER): Payer: Medicare Other | Admitting: *Deleted

## 2016-07-02 ENCOUNTER — Encounter (HOSPITAL_COMMUNITY): Payer: Self-pay | Admitting: General Practice

## 2016-07-02 DIAGNOSIS — N400 Enlarged prostate without lower urinary tract symptoms: Secondary | ICD-10-CM | POA: Diagnosis not present

## 2016-07-02 DIAGNOSIS — Z800 Family history of malignant neoplasm of digestive organs: Secondary | ICD-10-CM | POA: Insufficient documentation

## 2016-07-02 DIAGNOSIS — Z86010 Personal history of colonic polyps: Secondary | ICD-10-CM | POA: Insufficient documentation

## 2016-07-02 DIAGNOSIS — Z7901 Long term (current) use of anticoagulants: Secondary | ICD-10-CM | POA: Insufficient documentation

## 2016-07-02 DIAGNOSIS — Z79899 Other long term (current) drug therapy: Secondary | ICD-10-CM | POA: Insufficient documentation

## 2016-07-02 DIAGNOSIS — H918X9 Other specified hearing loss, unspecified ear: Secondary | ICD-10-CM | POA: Insufficient documentation

## 2016-07-02 DIAGNOSIS — L02213 Cutaneous abscess of chest wall: Secondary | ICD-10-CM | POA: Insufficient documentation

## 2016-07-02 DIAGNOSIS — Z96651 Presence of right artificial knee joint: Secondary | ICD-10-CM | POA: Diagnosis not present

## 2016-07-02 DIAGNOSIS — M1990 Unspecified osteoarthritis, unspecified site: Secondary | ICD-10-CM | POA: Insufficient documentation

## 2016-07-02 DIAGNOSIS — I472 Ventricular tachycardia: Secondary | ICD-10-CM | POA: Diagnosis not present

## 2016-07-02 DIAGNOSIS — I110 Hypertensive heart disease with heart failure: Secondary | ICD-10-CM | POA: Insufficient documentation

## 2016-07-02 DIAGNOSIS — I6523 Occlusion and stenosis of bilateral carotid arteries: Secondary | ICD-10-CM | POA: Diagnosis not present

## 2016-07-02 DIAGNOSIS — I5030 Unspecified diastolic (congestive) heart failure: Secondary | ICD-10-CM | POA: Insufficient documentation

## 2016-07-02 DIAGNOSIS — I442 Atrioventricular block, complete: Secondary | ICD-10-CM | POA: Insufficient documentation

## 2016-07-02 DIAGNOSIS — I481 Persistent atrial fibrillation: Principal | ICD-10-CM | POA: Insufficient documentation

## 2016-07-02 DIAGNOSIS — F419 Anxiety disorder, unspecified: Secondary | ICD-10-CM | POA: Diagnosis not present

## 2016-07-02 DIAGNOSIS — R001 Bradycardia, unspecified: Secondary | ICD-10-CM | POA: Diagnosis not present

## 2016-07-02 DIAGNOSIS — Z4509 Encounter for adjustment and management of other cardiac device: Secondary | ICD-10-CM | POA: Insufficient documentation

## 2016-07-02 DIAGNOSIS — I2510 Atherosclerotic heart disease of native coronary artery without angina pectoris: Secondary | ICD-10-CM | POA: Insufficient documentation

## 2016-07-02 DIAGNOSIS — I4891 Unspecified atrial fibrillation: Secondary | ICD-10-CM

## 2016-07-02 DIAGNOSIS — Z5181 Encounter for therapeutic drug level monitoring: Secondary | ICD-10-CM

## 2016-07-02 DIAGNOSIS — Z85820 Personal history of malignant melanoma of skin: Secondary | ICD-10-CM | POA: Insufficient documentation

## 2016-07-02 DIAGNOSIS — Z833 Family history of diabetes mellitus: Secondary | ICD-10-CM | POA: Insufficient documentation

## 2016-07-02 DIAGNOSIS — Z87891 Personal history of nicotine dependence: Secondary | ICD-10-CM | POA: Diagnosis not present

## 2016-07-02 DIAGNOSIS — Z950 Presence of cardiac pacemaker: Secondary | ICD-10-CM | POA: Diagnosis not present

## 2016-07-02 DIAGNOSIS — R222 Localized swelling, mass and lump, trunk: Secondary | ICD-10-CM

## 2016-07-02 DIAGNOSIS — I4892 Unspecified atrial flutter: Secondary | ICD-10-CM | POA: Insufficient documentation

## 2016-07-02 DIAGNOSIS — I483 Typical atrial flutter: Secondary | ICD-10-CM

## 2016-07-02 DIAGNOSIS — I739 Peripheral vascular disease, unspecified: Secondary | ICD-10-CM | POA: Insufficient documentation

## 2016-07-02 DIAGNOSIS — E785 Hyperlipidemia, unspecified: Secondary | ICD-10-CM | POA: Insufficient documentation

## 2016-07-02 DIAGNOSIS — Z888 Allergy status to other drugs, medicaments and biological substances status: Secondary | ICD-10-CM | POA: Diagnosis not present

## 2016-07-02 DIAGNOSIS — Z951 Presence of aortocoronary bypass graft: Secondary | ICD-10-CM | POA: Insufficient documentation

## 2016-07-02 HISTORY — DX: Other chronic pain: G89.29

## 2016-07-02 HISTORY — DX: Gastro-esophageal reflux disease without esophagitis: K21.9

## 2016-07-02 HISTORY — DX: Unspecified osteoarthritis, unspecified site: M19.90

## 2016-07-02 HISTORY — DX: Unspecified atrial fibrillation: I48.91

## 2016-07-02 HISTORY — PX: AV NODE ABLATION: EP1193

## 2016-07-02 HISTORY — DX: Low back pain, unspecified: M54.50

## 2016-07-02 HISTORY — DX: Pneumonia, unspecified organism: J18.9

## 2016-07-02 HISTORY — PX: AV NODE ABLATION: SHX1209

## 2016-07-02 HISTORY — DX: Low back pain: M54.5

## 2016-07-02 LAB — POCT INR: INR: 2.4

## 2016-07-02 SURGERY — AV NODE ABLATION

## 2016-07-02 MED ORDER — ONE-DAILY MULTI VITAMINS PO TABS: 1.0000 | ORAL_TABLET | Freq: Every day | ORAL | Status: DC

## 2016-07-02 MED ORDER — DIPHENHYDRAMINE HCL 25 MG PO CAPS
25.0000 mg | ORAL_CAPSULE | Freq: Every day | ORAL | Status: DC
  Administered 2016-07-02: 22:00:00 25 mg via ORAL
  Filled 2016-07-02: qty 1

## 2016-07-02 MED ORDER — BUPIVACAINE HCL (PF) 0.25 % IJ SOLN
INTRAMUSCULAR | Status: AC
  Filled 2016-07-02: qty 30

## 2016-07-02 MED ORDER — ONDANSETRON HCL 4 MG/2ML IJ SOLN: 4.0000 mg | Freq: Four times a day (QID) | INTRAMUSCULAR | Status: DC | PRN

## 2016-07-02 MED ORDER — VERAPAMIL HCL 2.5 MG/ML IV SOLN
INTRAVENOUS | Status: DC | PRN
  Administered 2016-07-02: 2.5 mg via INTRAVENOUS

## 2016-07-02 MED ORDER — WARFARIN SODIUM 5 MG PO TABS: 2.5000 mg | ORAL_TABLET | Freq: Every day | ORAL | Status: DC

## 2016-07-02 MED ORDER — MIDAZOLAM HCL 5 MG/5ML IJ SOLN
INTRAMUSCULAR | Status: AC
  Filled 2016-07-02: qty 5

## 2016-07-02 MED ORDER — PROBIOTIC PO CAPS: 1.0000 | ORAL_CAPSULE | Freq: Every day | ORAL | Status: DC

## 2016-07-02 MED ORDER — VERAPAMIL HCL 2.5 MG/ML IV SOLN
INTRAVENOUS | Status: AC
  Filled 2016-07-02: qty 2

## 2016-07-02 MED ORDER — ACETAMINOPHEN 500 MG PO TABS: 1000.0000 mg | ORAL_TABLET | Freq: Every day | ORAL | Status: DC | PRN

## 2016-07-02 MED ORDER — HYDROCODONE-ACETAMINOPHEN 5-325 MG PO TABS: 1.0000 | ORAL_TABLET | Freq: Every day | ORAL | Status: DC | PRN

## 2016-07-02 MED ORDER — DILTIAZEM HCL ER COATED BEADS 120 MG PO CP24
120.0000 mg | ORAL_CAPSULE | Freq: Every day | ORAL | Status: DC
  Administered 2016-07-03: 10:00:00 120 mg via ORAL
  Filled 2016-07-02: qty 1

## 2016-07-02 MED ORDER — WARFARIN SODIUM 5 MG PO TABS: 5.0000 mg | ORAL_TABLET | ORAL | Status: DC

## 2016-07-02 MED ORDER — HEPARIN (PORCINE) IN NACL 2-0.9 UNIT/ML-% IJ SOLN
INTRAMUSCULAR | Status: AC | PRN
  Administered 2016-07-02: 500 mL

## 2016-07-02 MED ORDER — RISAQUAD PO CAPS
1.0000 | ORAL_CAPSULE | Freq: Every day | ORAL | Status: DC
  Administered 2016-07-03: 1 via ORAL
  Filled 2016-07-02: qty 1

## 2016-07-02 MED ORDER — OMEGA-3-ACID ETHYL ESTERS 1 G PO CAPS
1.0000 g | ORAL_CAPSULE | Freq: Every evening | ORAL | Status: DC
  Administered 2016-07-02 – 2016-07-03 (×2): 1 g via ORAL
  Filled 2016-07-02 (×2): qty 1

## 2016-07-02 MED ORDER — DOXYLAMINE SUCCINATE (SLEEP) 25 MG PO TABS
25.0000 mg | ORAL_TABLET | Freq: Every day | ORAL | Status: DC
  Filled 2016-07-02: qty 1

## 2016-07-02 MED ORDER — CEFUROXIME SODIUM 1.5 G IV SOLR
1.5000 g | Freq: Once | INTRAVENOUS | Status: DC
  Filled 2016-07-02: qty 1.5

## 2016-07-02 MED ORDER — SODIUM CHLORIDE 0.9% FLUSH: 3.0000 mL | INTRAVENOUS | Status: DC | PRN

## 2016-07-02 MED ORDER — BUPIVACAINE HCL (PF) 0.25 % IJ SOLN
INTRAMUSCULAR | Status: DC | PRN
  Administered 2016-07-02: 20 mL

## 2016-07-02 MED ORDER — WARFARIN SODIUM 5 MG PO TABS: 2.5000 mg | ORAL_TABLET | ORAL | Status: DC

## 2016-07-02 MED ORDER — SODIUM CHLORIDE 0.9 % IV SOLN: 250.0000 mL | INTRAVENOUS | Status: DC | PRN

## 2016-07-02 MED ORDER — MIDAZOLAM HCL 5 MG/5ML IJ SOLN
INTRAMUSCULAR | Status: DC | PRN
  Administered 2016-07-02 (×2): 1 mg via INTRAVENOUS

## 2016-07-02 MED ORDER — OMEGA-3 FATTY ACIDS 1000 MG PO CAPS: 1.0000 g | ORAL_CAPSULE | Freq: Every evening | ORAL | Status: DC

## 2016-07-02 MED ORDER — ADULT MULTIVITAMIN W/MINERALS CH
1.0000 | ORAL_TABLET | Freq: Every day | ORAL | Status: DC
  Administered 2016-07-03: 1 via ORAL
  Filled 2016-07-02: qty 1

## 2016-07-02 MED ORDER — SODIUM CHLORIDE 0.45 % IV SOLN: INTRAVENOUS | Status: DC

## 2016-07-02 MED ORDER — SODIUM CHLORIDE 0.9% FLUSH
3.0000 mL | Freq: Two times a day (BID) | INTRAVENOUS | Status: DC
  Administered 2016-07-03: 3 mL via INTRAVENOUS

## 2016-07-02 MED ORDER — WARFARIN - PHYSICIAN DOSING INPATIENT: Freq: Every day | Status: DC

## 2016-07-02 MED ORDER — TAMSULOSIN HCL 0.4 MG PO CAPS
0.4000 mg | ORAL_CAPSULE | Freq: Every day | ORAL | Status: DC
  Administered 2016-07-03: 0.4 mg via ORAL
  Filled 2016-07-02: qty 1

## 2016-07-02 MED ORDER — ACETAMINOPHEN 325 MG PO TABS: 650.0000 mg | ORAL_TABLET | ORAL | Status: DC | PRN

## 2016-07-02 MED ORDER — ATORVASTATIN CALCIUM 10 MG PO TABS
10.0000 mg | ORAL_TABLET | Freq: Every day | ORAL | Status: DC
  Administered 2016-07-03: 10:00:00 10 mg via ORAL
  Filled 2016-07-02: qty 1

## 2016-07-02 SURGICAL SUPPLY — 8 items
BAG SNAP BAND KOVER 36X36 (MISCELLANEOUS) ×2 IMPLANT
CATH BLAZER 5MM LG 8F 5086TMK2 (ABLATOR) ×2 IMPLANT
INTRODUCER SWARTZ SL2 8F (SHEATH) ×2 IMPLANT
PACK EP LATEX FREE (CUSTOM PROCEDURE TRAY)
PACK EP LF (CUSTOM PROCEDURE TRAY) IMPLANT
PAD DEFIB LIFELINK (PAD) ×2 IMPLANT
SHEATH PINNACLE 8F 10CM (SHEATH) ×2 IMPLANT
SHIELD RADPAD SCOOP 12X17 (MISCELLANEOUS) ×2 IMPLANT

## 2016-07-02 NOTE — H&P (Signed)
Patient Care Team: Biagio Borg, MD as PCP - General   HPI  Jonathan Orozco is a 83 y.o. male Admitted for AV ablation  He has hx of atrial flutters/fib  for which  he underwent EP testing and 3-D mapping which failed to produce stable arrhythmias; medical therapy was undertaken.  He has been intolerant of amiodarone. 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   He has Zhx of CAD and underwent CABG 2/17    His atrial arrhythmias have been managed with rate control and anticoagulation with warfarin. Previously he had been on NOACs but was intolerant     He was cardioverted twice fall 2017 while on amio with recurrent atrial flutter-two different ones, one clearly not typical the other not clearly typical, but close.  Mostly assoc with RVR HR >110 (Personally reviewed  )    We discussed dofetilide and amio level 04/16/16  was obtained ( 0.9)  He elected to pursue an ablate and pace solution   He has struggled with fatigue and shortness of breath, which in his mind has been closely linked to his atrial arrhtyhmia.  He has normal LV function;  We had tried HIS bundle pacing but could not find adequate thresholds and so single chamber device was implanted.             Past Medical History:  Diagnosis Date  . ABUSE, ALCOHOL, IN REMISSION 04/08/2007  . ALLERGIC RHINITIS 09/29/2006  . Atrial fibrillation ([place])   . Atrial flutter ([place]) 03/28/2010   a. s/p prior rfca;  b. recurrent paroxysmal flutter 04/2012;  c. pradaxa initiated 04/2012.  [name] BENIGN PROSTATIC HYPERTROPHY 09/29/2006  . BPPV (benign paroxysmal positional vertigo) 04/08/2007  . CAD (coronary artery disease)    a. LHC 2/17: EF 55-65%, LM 75, pLAD 75, oD1 75, oD2 65, D3 85, oLCx 99, oOM1 75, pRCA 25 >> CABG  . Carotid stenosis    a. Carotid US 2/17:  Bilateral ICA 1-39% ICA  . COLONIC POLYPS, HX OF 06/23/2007  . DISORDERS, ORGANIC INSOMNIA NOS 09/29/2006    . Diverticulosis   . ERECTILE DYSFUNCTION 09/29/2006  . GLUCOSE INTOLERANCE 03/19/2010  . HYPERLIPIDEMIA 09/29/2006  . HYPERTENSION 09/29/2006  . Long term (current) use of anticoagulants 04/26/2010  . MELANOMA, MALIGNANT, SKIN NOS 09/29/2006   other skin cancers -no further melanoma  . OSTEOARTHROSIS NOS, OTHER [facility] SITE 09/29/2006  . Other specified forms of hearing loss 08/10/2009  . Presence of permanent cardiac pacemaker 06/02/2016  . VENTRICULAR TACHYCARDIA 09/29/2006    Past Surgical History:  Procedure Laterality Date  . CARDIAC CATHETERIZATION N/A 03/12/2015   Procedure: Left Heart Cath and Coronary Angiography;  Surgeon: [name], MD;  Location: [place] [facility];  Service: Cardiovascular;  Laterality: N/A;  . CARDIOVERSION N/A 12/19/2015   Procedure: CARDIOVERSION;  Surgeon: [name], MD;  Location: [facility] ENDOSCOPY;  Service: Cardiovascular;  Laterality: N/A;  . CARDIOVERSION N/A 03/24/2016   Procedure: CARDIOVERSION;  Surgeon: [name], MD;  Location: [place];  Service: Cardiovascular;  Laterality: N/A;  . CATARACT EXTRACTION, BILATERAL Bilateral   . CLIPPING OF ATRIAL APPENDAGE N/A 03/14/2015   Procedure: CLIPPING OF ATRIAL APPENDAGE;  Surgeon: [name], MD;  Location: [place];  Service: Open Heart Surgery;  Laterality: N/A;  . COLONOSCOPY WITH PROPOFOL N/A 12/08/2014   Procedure: COLONOSCOPY WITH PROPOFOL;  Surgeon: [name], MD;  Location:  [facility] ENDOSCOPY;  Service: Endoscopy;  Laterality: N/A;  . CORONARY ARTERY BYPASS GRAFT N/A 03/14/2015   Procedure: CORONARY ARTERY BYPASS GRAFTING (CABG) x 4 (LIMA to LAD, SVG to DIAGONAL 2, SVG SEQUENTIALLY to OM1 and OM2);  Surgeon: [name], MD;  Location: [place];  Service: Open Heart Surgery;  Laterality: N/A;  . INSERT / REPLACE / REMOVE PACEMAKER  06/02/2016  . JOINT REPLACEMENT     Right knee  . Left knee arthroscopy    . Left rotato cuff    . PACEMAKER IMPLANT N/A 06/02/2016   Procedure:  Pacemaker Implant;  Surgeon: Deboraha Sprang, MD;  Location: [place] [facility] LAB;  Service: Cardiovascular;  Laterality: N/A;  . TEE WITHOUT CARDIOVERSION N/A 03/14/2015   Procedure: TRANSESOPHAGEAL ECHOCARDIOGRAM (TEE);  Surgeon: [name], MD;  Location: [place];  Service: Open Heart Surgery;  Laterality: N/A;  . TEE WITHOUT CARDIOVERSION N/A 12/19/2015   Procedure: TRANSESOPHAGEAL ECHOCARDIOGRAM (TEE);  Surgeon: [name], MD;  Location: [place];  Service: Cardiovascular;  Laterality: N/A;  . TEE WITHOUT CARDIOVERSION N/A 03/24/2016   Procedure: TRANSESOPHAGEAL ECHOCARDIOGRAM (TEE);  Surgeon: [name], MD;  Location: [place];  Service: Cardiovascular;  Laterality: N/A;  . TONSILLECTOMY      No current facility-administered medications for this encounter.     Allergies  Allergen Reactions  . Amiodarone Hcl Other (See Comments)    Patient reports photosensitivity  . Ace Inhibitors Cough      Social History  Substance Use Topics  . Smoking status: Former Research scientist (life sciences)  . Smokeless tobacco: Never Used     Comment: Quit smoking and drinking 20 years ago  . Alcohol use No     Family History  Problem Relation Age of Onset  . Diabetes Brother   . Esophageal cancer Father   . Colon cancer Neg Hx   . Stomach cancer Neg Hx      No current facility-administered medications on file prior to encounter.    Current Outpatient Prescriptions on File Prior to Encounter  Medication Sig Dispense Refill  . fish oil-omega-3 fatty acids 1000 MG capsule Take 1 g by mouth every evening.     . furosemide (LASIX) 20 MG tablet Take one tablet (20 mg) by mouth once daily x 7 days, then once daily as needed for swelling or shortness of breath (Patient taking differently: Take 20 mg by mouth daily as needed for fluid. ) 30 tablet 3  . Multiple Vitamin (MULTIVITAMIN) tablet Take 1 tablet by mouth daily.      . Psyllium (METAMUCIL PO) 1 TABLESPOON IN A GLASS OF WATER ONCE DAILY    .  warfarin (COUMADIN) 5 MG tablet Take as directed by Coumadin clinic. (Patient taking differently: Take 2.5-5 mg by mouth at bedtime. TAKES 5mg  ON SUN, MON, WED AND FRI  TAKES 2.5 mg ON TUES, THURS, AND SAT) 100 tablet 0      Review of Systems negative except from HPI and PMH  Physical Exam BP (!) 128/99   Pulse (!) 122   Temp 97.9 F (36.6 C)   Resp 16   Ht 6' (1.829 m)   Wt 179 lb (81.2 kg)   SpO2 99%   BMI 24.28 kg/m  Well developed and nourished in no acute distress HENT normal Neck supple   Device pocket well healed; without hematoma or erythema.  There is no tethering  Clear Irregularly irregular rate and rhythm with rapid ventricular response, no murmurs or gallops  Abd-soft with active BS without hepatomegaly No Clubbing cyanosis edema Skin-warm and dry A & Oriented  Grossly normal sensory and motor function    Assessment and  Plan  Atrial fibrillation/flutter permanent  Sinus bradycardia  Hypertension  CAD s/p CABG   HFpEF     Persistent atrial flutter/fib.   For AV ablation, having undergone single chamber pacing a month ago with good healing

## 2016-07-02 NOTE — Progress Notes (Signed)
Site area: rt groin fv sheath Site Prior to Removal:  Level 0 Pressure Applied For:  15 minutes Manual:   yes Patient Status During Pull:  stable Post Pull Site:  Level  0 Post Pull Instructions Given:  yes Post Pull Pulses Present: palpable Dressing Applied:  Gauze and tegaderm Bedrest begins @ [id number] Comments:

## 2016-07-02 NOTE — Progress Notes (Signed)
[address]       [place] [address]             [phone]      [name] is an 83 yo man who had CABG x 4 and left atrial clipping about a year ago. He underwent an ablation procedure today. Dr. [name] noted a pustule in the midline sternal incision. He denies fevers and says the area just "popped up" recently. No pain or sternal motion noted  Past Medical History:  Diagnosis Date  . ABUSE, ALCOHOL, IN REMISSION 04/08/2007  . ALLERGIC RHINITIS 09/29/2006  . Atrial fibrillation ([place])   . Atrial flutter ([place]) 03/28/2010   a. s/p prior rfca;  b. recurrent paroxysmal flutter 04/2012;  c. pradaxa initiated 04/2012.  [name] BENIGN PROSTATIC HYPERTROPHY 09/29/2006  . BPPV (benign paroxysmal positional vertigo) 04/08/2007  . CAD (coronary artery disease)    a. LHC 2/17: EF 55-65%, LM 75, pLAD 75, oD1 75, oD2 65, D3 85, oLCx 99, oOM1 75, pRCA 25 >> CABG  . Carotid stenosis    a. Carotid US 2/17:  Bilateral ICA 1-39% ICA  . COLONIC POLYPS, HX OF 06/23/2007  . 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 Diverticulosis   . ERECTILE DYSFUNCTION 09/29/2006  . GLUCOSE INTOLERANCE 03/19/2010  . HYPERLIPIDEMIA 09/29/2006  . HYPERTENSION 09/29/2006  . Long term (current) use of anticoagulants 04/26/2010  . MELANOMA, MALIGNANT, SKIN NOS 09/29/2006   other skin cancers -no further melanoma  . OSTEOARTHROSIS NOS, OTHER [facility] SITE 09/29/2006  . Other specified forms of hearing loss 08/10/2009  . Presence of permanent cardiac pacemaker 06/02/2016  . VENTRICULAR TACHYCARDIA 09/29/2006   Past Surgical History:  Procedure Laterality Date  . CARDIAC CATHETERIZATION N/A 03/12/2015   Procedure: Left Heart Cath and Coronary Angiography;  Surgeon: [name], MD;  Location: [place] CV LAB;  Service: Cardiovascular;  Laterality: N/A;  . CARDIOVERSION N/A 12/19/2015   Procedure: CARDIOVERSION;  Surgeon: [name], MD;  [facility]: [facility] ENDOSCOPY;  Service: Cardiovascular;  Laterality: N/A;  .  CARDIOVERSION N/A 03/24/2016   Procedure: CARDIOVERSION;  Surgeon: [name], MD;  Location: [place];  Service: Cardiovascular;  Laterality: N/A;  . CATARACT EXTRACTION, BILATERAL Bilateral   . CLIPPING OF ATRIAL APPENDAGE N/A 03/14/2015   Procedure: CLIPPING OF ATRIAL APPENDAGE;  Surgeon: [name], MD;  Location: [place];  Service: Open Heart Surgery;  Laterality: N/A;  . COLONOSCOPY WITH PROPOFOL N/A 12/08/2014   Procedure: COLONOSCOPY WITH PROPOFOL;  Surgeon: [name], MD;  Location: [facility] ENDOSCOPY;  Service: Endoscopy;  Laterality: N/A;  . CORONARY ARTERY BYPASS GRAFT N/A 03/14/2015   Procedure: CORONARY ARTERY BYPASS GRAFTING (CABG) x 4 (LIMA to LAD, SVG to DIAGONAL 2, SVG SEQUENTIALLY to OM1 and OM2);  Surgeon: [name], MD;  Location: [place];  Service: Open Heart Surgery;  Laterality: N/A;  . INSERT / REPLACE / REMOVE PACEMAKER  06/02/2016  . JOINT REPLACEMENT     Right knee  . Left knee arthroscopy    . Left rotato cuff    . PACEMAKER IMPLANT N/A 06/02/2016   Procedure: Pacemaker Implant;  Surgeon: Deboraha Sprang, MD;  Location: [place] CV LAB;  Service: Cardiovascular;  Laterality: N/A;  . TEE WITHOUT CARDIOVERSION N/A 03/14/2015   Procedure: TRANSESOPHAGEAL ECHOCARDIOGRAM (TEE);  Surgeon: [name], MD;  Location: [place];  Service: Open Heart Surgery;  Laterality: N/A;  . TEE WITHOUT CARDIOVERSION N/A 12/19/2015  Procedure: TRANSESOPHAGEAL ECHOCARDIOGRAM (TEE);  Surgeon: [name], MD;  Location: [place];  Service: Cardiovascular;  Laterality: N/A;  . TEE WITHOUT CARDIOVERSION N/A 03/24/2016   Procedure: TRANSESOPHAGEAL ECHOCARDIOGRAM (TEE);  Surgeon: [name], MD;  Location: [facility] ENDOSCOPY;  Service: Cardiovascular;  Laterality: N/A;  . TONSILLECTOMY     PE: 83 yo man in NAD Alert and oriented with no focal motor deficit Sternum stable with 1.5 cm raised red area in midportion of incision. Mildly warm, nontender.  Fluctuant Cardiac regular Lungs clear Abdomen benign  IMPRESSION  Pustule overlying sternal incision.  PLAN  I think safest course of action is to I & D the area in the OR tomorrow. I discussed the procedure with Jonathan Orozco. This can be done under local with or without sedation. Will likely need to be left open and packed to heal by secondary intention.  I discussed the indications, risks, benefits and alternatives with Jonathan Orozco. He understands the risks include, but are not limited to bleeding, wound or sternal infection as well as the possibility of other unforeseeable complications. He agrees to proceed. Will add to OR schedule for tomorrow.  Revonda Standard Roxan Hockey, MD [facility] [phone]

## 2016-07-03 ENCOUNTER — Encounter (HOSPITAL_COMMUNITY)
Admission: RE | Disposition: A | Discharge status: Home / Self Care | Payer: Self-pay | Source: Ambulatory Visit | Attending: Internal Medicine

## 2016-07-03 ENCOUNTER — Observation Stay (HOSPITAL_COMMUNITY): Payer: Medicare Other | Admitting: Certified Registered"

## 2016-07-03 ENCOUNTER — Encounter (HOSPITAL_COMMUNITY): Payer: Self-pay | Admitting: Internal Medicine

## 2016-07-03 DIAGNOSIS — I442 Atrioventricular block, complete: Secondary | ICD-10-CM | POA: Diagnosis not present

## 2016-07-03 DIAGNOSIS — T85898A Other specified complication of other internal prosthetic devices, implants and grafts, initial encounter: Secondary | ICD-10-CM | POA: Diagnosis not present

## 2016-07-03 DIAGNOSIS — L02213 Cutaneous abscess of chest wall: Secondary | ICD-10-CM | POA: Diagnosis not present

## 2016-07-03 DIAGNOSIS — I4892 Unspecified atrial flutter: Secondary | ICD-10-CM | POA: Diagnosis not present

## 2016-07-03 DIAGNOSIS — I481 Persistent atrial fibrillation: Secondary | ICD-10-CM | POA: Diagnosis not present

## 2016-07-03 DIAGNOSIS — I4891 Unspecified atrial fibrillation: Secondary | ICD-10-CM

## 2016-07-03 HISTORY — PX: INCISION AND DRAINAGE: SHX5863

## 2016-07-03 LAB — SURGICAL PCR SCREEN
MRSA, PCR: NEGATIVE
Staphylococcus aureus: NEGATIVE

## 2016-07-03 SURGERY — INCISION AND DRAINAGE
Anesthesia: Monitor Anesthesia Care | Site: Chest

## 2016-07-03 MED ORDER — DEXTROSE 5 % IV SOLN
1.5000 g | INTRAVENOUS | Status: AC
  Administered 2016-07-03: 1.5 g via INTRAVENOUS
  Filled 2016-07-03: qty 1.5

## 2016-07-03 MED ORDER — 0.9 % SODIUM CHLORIDE (POUR BTL) OPTIME
TOPICAL | Status: DC | PRN
  Administered 2016-07-03: 1000 mL

## 2016-07-03 MED ORDER — FUROSEMIDE 20 MG PO TABS: 20.0000 mg | ORAL_TABLET | Freq: Every day | ORAL | Status: DC | PRN

## 2016-07-03 MED ORDER — PROPOFOL 10 MG/ML IV BOLUS
INTRAVENOUS | Status: DC | PRN
  Administered 2016-07-03: 20 mg via INTRAVENOUS
  Administered 2016-07-03: 10 mg via INTRAVENOUS
  Administered 2016-07-03: 20 mg via INTRAVENOUS
  Administered 2016-07-03: 30 mg via INTRAVENOUS
  Administered 2016-07-03 (×2): 10 mg via INTRAVENOUS
  Administered 2016-07-03 (×2): 20 mg via INTRAVENOUS

## 2016-07-03 MED ORDER — PROPOFOL 10 MG/ML IV BOLUS
INTRAVENOUS | Status: AC
  Filled 2016-07-03: qty 20

## 2016-07-03 MED ORDER — OFF THE BEAT BOOK
Freq: Once | Status: AC
  Administered 2016-07-03: 07:00:00
  Filled 2016-07-03: qty 1

## 2016-07-03 MED ORDER — LACTATED RINGERS IV SOLN
INTRAVENOUS | Status: DC
  Administered 2016-07-03 (×2): via INTRAVENOUS

## 2016-07-03 MED ORDER — LIDOCAINE HCL (CARDIAC) 20 MG/ML IV SOLN
INTRAVENOUS | Status: DC | PRN
  Administered 2016-07-03: 60 mg via INTRAVENOUS

## 2016-07-03 MED ORDER — FENTANYL CITRATE (PF) 250 MCG/5ML IJ SOLN
INTRAMUSCULAR | Status: AC
  Filled 2016-07-03: qty 5

## 2016-07-03 MED ORDER — LIDOCAINE HCL 1 % IJ SOLN
INTRAMUSCULAR | Status: DC | PRN
  Administered 2016-07-03: 20 mL

## 2016-07-03 MED ORDER — LIDOCAINE HCL 1 % IJ SOLN
INTRAMUSCULAR | Status: AC
  Filled 2016-07-03: qty 20

## 2016-07-03 SURGICAL SUPPLY — 36 items
BANDAGE ACE 4X5 VEL STRL LF (GAUZE/BANDAGES/DRESSINGS) IMPLANT
BANDAGE ACE 6X5 VEL STRL LF (GAUZE/BANDAGES/DRESSINGS) IMPLANT
BNDG GAUZE ELAST 4 BULKY (GAUZE/BANDAGES/DRESSINGS) IMPLANT
CANISTER SUCT 3000ML PPV (MISCELLANEOUS) ×4 IMPLANT
CONT SPEC 4OZ CLIKSEAL STRL BL (MISCELLANEOUS) ×4 IMPLANT
COVER SURGICAL LIGHT HANDLE (MISCELLANEOUS) ×8 IMPLANT
ELECT REM PT RETURN 9FT ADLT (ELECTROSURGICAL) ×4
ELECTRODE REM PT RTRN 9FT ADLT (ELECTROSURGICAL) ×2 IMPLANT
GAUZE PACKING IODOFORM 1/4X15 (GAUZE/BANDAGES/DRESSINGS) ×4 IMPLANT
GAUZE SPONGE 4X4 12PLY STRL (GAUZE/BANDAGES/DRESSINGS) ×4 IMPLANT
GAUZE SPONGE 4X4 12PLY STRL LF (GAUZE/BANDAGES/DRESSINGS) ×4 IMPLANT
GLOVE BIO SURGEON STRL SZ 6 (GLOVE) ×4 IMPLANT
GLOVE EUDERMIC 7 POWDERFREE (GLOVE) ×4 IMPLANT
GOWN STRL REUS W/ TWL LRG LVL3 (GOWN DISPOSABLE) ×4 IMPLANT
GOWN STRL REUS W/ TWL XL LVL3 (GOWN DISPOSABLE) ×2 IMPLANT
GOWN STRL REUS W/TWL LRG LVL3 (GOWN DISPOSABLE) ×6
GOWN STRL REUS W/TWL XL LVL3 (GOWN DISPOSABLE) ×3
KIT BASIN OR (CUSTOM PROCEDURE TRAY) ×4 IMPLANT
KIT ROOM TURNOVER OR (KITS) ×4 IMPLANT
NEEDLE PRECISIONGLIDE 27X1.5 (NEEDLE) ×4 IMPLANT
NS IRRIG 1000ML POUR BTL (IV SOLUTION) ×4 IMPLANT
PACK GENERAL/GYN (CUSTOM PROCEDURE TRAY) ×4 IMPLANT
PACK UNIVERSAL I (CUSTOM PROCEDURE TRAY) ×4 IMPLANT
PAD ARMBOARD 7.5X6 YLW CONV (MISCELLANEOUS) ×8 IMPLANT
STAPLER VISISTAT 35W (STAPLE) IMPLANT
SUT VIC AB 2-0 CTX 36 (SUTURE) IMPLANT
SUT VIC AB 3-0 SH 27 (SUTURE)
SUT VIC AB 3-0 SH 27X BRD (SUTURE) IMPLANT
SUT VIC AB 3-0 X1 27 (SUTURE) ×4 IMPLANT
SWAB COLLECTION DEVICE MRSA (MISCELLANEOUS) ×4 IMPLANT
SYR CONTROL 10ML LL (SYRINGE) ×4 IMPLANT
TAPE CLOTH SURG 4X10 WHT LF (GAUZE/BANDAGES/DRESSINGS) ×4 IMPLANT
TOWEL OR 17X24 6PK STRL BLUE (TOWEL DISPOSABLE) ×4 IMPLANT
TOWEL OR 17X26 10 PK STRL BLUE (TOWEL DISPOSABLE) ×4 IMPLANT
TUBE ANAEROBIC SPECIMEN COL (MISCELLANEOUS) ×4 IMPLANT
WATER STERILE IRR 1000ML POUR (IV SOLUTION) ×4 IMPLANT

## 2016-07-03 NOTE — H&P (View-Only) (Signed)
[address]       [place] [address]             [phone]      Jonathan Orozco is an 83 yo man who had CABG x 4 and left atrial clipping about a year ago. He underwent an ablation procedure today. Dr. [name] noted a pustule in the midline sternal incision. He denies fevers and says the area just "popped up" recently. No pain or sternal motion noted  Past Medical History:  Diagnosis Date  . ABUSE, ALCOHOL, IN REMISSION 04/08/2007  . ALLERGIC RHINITIS 09/29/2006  . Atrial fibrillation ([place])   . Atrial flutter ([place]) 03/28/2010   a. s/p prior rfca;  b. recurrent paroxysmal flutter 04/2012;  c. pradaxa initiated 04/2012.  [name] BENIGN PROSTATIC HYPERTROPHY 09/29/2006  . BPPV (benign paroxysmal positional vertigo) 04/08/2007  . CAD (coronary artery disease)    a. LHC 2/17: EF 55-65%, LM 75, pLAD 75, oD1 75, oD2 65, D3 85, oLCx 99, oOM1 75, pRCA 25 >> CABG  . Carotid stenosis    a. Carotid US 2/17:  Bilateral ICA 1-39% ICA  . COLONIC POLYPS, HX OF 06/23/2007  . 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 Diverticulosis   . ERECTILE DYSFUNCTION 09/29/2006  . GLUCOSE INTOLERANCE 03/19/2010  . HYPERLIPIDEMIA 09/29/2006  . HYPERTENSION 09/29/2006  . Long term (current) use of anticoagulants 04/26/2010  . MELANOMA, MALIGNANT, SKIN NOS 09/29/2006   other skin cancers -no further melanoma  . OSTEOARTHROSIS NOS, OTHER [facility] SITE 09/29/2006  . Other specified forms of hearing loss 08/10/2009  . Presence of permanent cardiac pacemaker 06/02/2016  . VENTRICULAR TACHYCARDIA 09/29/2006   Past Surgical History:  Procedure Laterality Date  . CARDIAC CATHETERIZATION N/A 03/12/2015   Procedure: Left Heart Cath and Coronary Angiography;  Surgeon: [name], MD;  Location: [place] CV LAB;  Service: Cardiovascular;  Laterality: N/A;  . CARDIOVERSION N/A 12/19/2015   Procedure: CARDIOVERSION;  Surgeon: [name], MD;  Location: [facility] ENDOSCOPY;  Service: Cardiovascular;  Laterality: N/A;  .  CARDIOVERSION N/A 03/24/2016   Procedure: CARDIOVERSION;  Surgeon: [name], MD;  Location: [place];  Service: Cardiovascular;  Laterality: N/A;  . CATARACT EXTRACTION, BILATERAL Bilateral   . CLIPPING OF ATRIAL APPENDAGE N/A 03/14/2015   Procedure: CLIPPING OF ATRIAL APPENDAGE;  Surgeon: [name], MD;  Location: [place];  Service: Open Heart Surgery;  Laterality: N/A;  . COLONOSCOPY WITH PROPOFOL N/A 12/08/2014   Procedure: COLONOSCOPY WITH PROPOFOL;  Surgeon: [name], MD;  Location: [facility] ENDOSCOPY;  Service: Endoscopy;  Laterality: N/A;  . CORONARY ARTERY BYPASS GRAFT N/A 03/14/2015   Procedure: CORONARY ARTERY BYPASS GRAFTING (CABG) x 4 (LIMA to LAD, SVG to DIAGONAL 2, SVG SEQUENTIALLY to OM1 and OM2);  Surgeon: [name], MD;  Location: [place];  Service: Open Heart Surgery;  Laterality: N/A;  . INSERT / REPLACE / REMOVE PACEMAKER  06/02/2016  . JOINT REPLACEMENT     Right knee  . Left knee arthroscopy    . Left rotato cuff    . PACEMAKER IMPLANT N/A 06/02/2016   Procedure: Pacemaker Implant;  Surgeon: Deboraha Sprang, MD;  Location: [place] CV LAB;  Service: Cardiovascular;  Laterality: N/A;  . TEE WITHOUT CARDIOVERSION N/A 03/14/2015   Procedure: TRANSESOPHAGEAL ECHOCARDIOGRAM (TEE);  Surgeon: [name], MD;  Location: [place];  Service: Open Heart Surgery;  Laterality: N/A;  . TEE WITHOUT CARDIOVERSION N/A 12/19/2015  Procedure: TRANSESOPHAGEAL ECHOCARDIOGRAM (TEE);  Surgeon: [name], MD;  Location: [place];  Service: Cardiovascular;  Laterality: N/A;  . TEE WITHOUT CARDIOVERSION N/A 03/24/2016   Procedure: TRANSESOPHAGEAL ECHOCARDIOGRAM (TEE);  Surgeon: [name], MD;  Location: [facility] ENDOSCOPY;  Service: Cardiovascular;  Laterality: N/A;  . TONSILLECTOMY     PE: 83 yo man in NAD Alert and oriented with no focal motor deficit Sternum stable with 1.5 cm raised red area in midportion of incision. Mildly warm, nontender.  Fluctuant Cardiac regular Lungs clear Abdomen benign  IMPRESSION  Pustule overlying sternal incision.  PLAN  I think safest course of action is to I & D the area in the OR tomorrow. I discussed the procedure with Jonathan Orozco. This can be done under local with or without sedation. Will likely need to be left open and packed to heal by secondary intention.  I discussed the indications, risks, benefits and alternatives with Jonathan Orozco. He understands the risks include, but are not limited to bleeding, wound or sternal infection as well as the possibility of other unforeseeable complications. He agrees to proceed. Will add to OR schedule for tomorrow.  Revonda Standard Roxan Hockey, MD [facility] [phone]

## 2016-07-03 NOTE — Care Management Note (Signed)
Case Management Note  Patient Details  Name: Jonathan Orozco MRN: [id number] Date of Birth: [birth year]  Subjective/Objective:    From home with wife, uses a walker sometimes at home, S/p  AV ablation.  Has [facility] [employer].   PCP [name]              Action/Plan: NCM will follow for dc needs.  Expected Discharge Date:                  Expected Discharge Plan:     In-House Referral:     Discharge planning Services  CM Consult  Post Acute Care Choice:    Choice offered to:     DME Arranged:    DME Agency:     HH Arranged:    HH Agency:     Status of Service:  In process, will continue to follow  If discussed at Long Length of Stay Meetings, dates discussed:    Additional Comments:  Zenon Mayo, RN 07/03/2016, 8:52 AM

## 2016-07-03 NOTE — Transfer of Care (Signed)
Immediate Anesthesia Transfer of Care Note  Patient: Jonathan Orozco  Procedure(s) Performed: Procedure(s): INCISION AND DRAINAGE of CHEST ABSCESS (N/A)  Patient Location: PACU  Anesthesia Type:MAC  Level of Consciousness: awake, alert  and oriented  Airway & Oxygen Therapy: Patient Spontanous Breathing and Patient connected to nasal cannula oxygen  Post-op Assessment: Report given to RN, Post -op Vital signs reviewed and stable and Patient moving all extremities X 4  Post vital signs: Reviewed and stable  Last Vitals:  Vitals:   07/03/16 0800 07/03/16 1100  BP:  120/79  Pulse: 89 89  Resp: 17 17  Temp: 36.5 C 36.7 C    Last Pain:  Vitals:   07/03/16 1100  TempSrc: Oral         Complications: No apparent anesthesia complications

## 2016-07-03 NOTE — Progress Notes (Signed)
Patient Name: Jonathan Orozco      SUBJECTIVE: Patient stable following AV ablation yesterday. I have reviewed the pustule on his chest with Dr. Merilynn Finland. The thought was that it would be best treated with operative drainage. That is to happen later today.  Past Medical History:  Diagnosis Date  . ABUSE, ALCOHOL, IN REMISSION 04/08/2007  . ALLERGIC RHINITIS 09/29/2006  . Arthritis    "hands" (07/02/2016)  . Atrial fibrillation ([place])   . Atrial fibrillation with RVR ([place]) 07/02/2016  . Atrial flutter ([place]) 03/28/2010   a. s/p prior rfca;  b. recurrent paroxysmal flutter 04/2012;  c. pradaxa initiated 04/2012.  [name] BENIGN PROSTATIC HYPERTROPHY 09/29/2006  . BPPV (benign paroxysmal positional vertigo) 04/08/2007  . CAD (coronary artery disease)    a. LHC 2/17: EF 55-65%, LM 75, pLAD 75, oD1 75, oD2 65, D3 85, oLCx 99, oOM1 75, pRCA 25 >> CABG  . Carotid stenosis    a. Carotid US 2/17:  Bilateral ICA 1-39% ICA  . Chronic lower back pain   . COLONIC POLYPS, HX OF 06/23/2007  . 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 Diverticulosis   . ERECTILE DYSFUNCTION 09/29/2006  . GERD (gastroesophageal reflux disease)   . GLUCOSE INTOLERANCE 03/19/2010  . HYPERLIPIDEMIA 09/29/2006  . HYPERTENSION 09/29/2006  . Long term (current) use of anticoagulants 04/26/2010  . MELANOMA, MALIGNANT, SKIN NOS 09/29/2006   other skin cancers -no further melanoma  . OSTEOARTHROSIS NOS, OTHER [facility] SITE 09/29/2006  . Other specified forms of hearing loss 08/10/2009  . Pneumonia ~ 1969  . Presence of permanent cardiac pacemaker 06/02/2016  . VENTRICULAR TACHYCARDIA 09/29/2006    Scheduled Meds:  Scheduled Meds: . [MAR Hold] acidophilus  1 capsule Oral Daily  . [MAR Hold] atorvastatin  10 mg Oral Daily  . [MAR Hold] diltiazem  120 mg Oral Daily  . [MAR Hold] diphenhydrAMINE  25 mg Oral QHS  . [MAR Hold] multivitamin with minerals  1 tablet Oral Daily  . [MAR Hold] omega-3 acid ethyl esters  1 g Oral  QPM  . [MAR Hold] sodium chloride flush  3 mL Intravenous Q12H  . [MAR Hold] tamsulosin  0.4 mg Oral Daily  . [MAR Hold] warfarin  5 mg Oral Once per day on Sun Mon Wed Fri   And  . [MAR Hold] warfarin  2.5 mg Oral Once per day on Tue Thu Sat  . [MAR Hold] Warfarin - Physician Dosing Inpatient   Does not apply q1800   Continuous Infusions: . [MAR Hold] sodium chloride    . [MAR Hold] cefUROXime (ZINACEF)  IV    . lactated ringers     [MAR Hold] sodium chloride, [MAR Hold] acetaminophen, [MAR Hold] acetaminophen, [MAR Hold] HYDROcodone-acetaminophen, [MAR Hold] ondansetron (ZOFRAN) IV, [MAR Hold] sodium chloride flush    PHYSICAL EXAM Vitals:   07/02/16 2000 07/03/16 0643 07/03/16 0800 07/03/16 1100  BP: (!) 90/59 119/80  120/79  Pulse: 89 90 89 89  Resp: 20 13 17 17   Temp:  97.7 F (36.5 C) 97.7 F (36.5 C) 98.1 F (36.7 C)  TempSrc:  Oral Oral Oral  SpO2: 97% 97% 96% 96%  Weight:  178 lb 9.2 oz (81 kg)    Height:        Well developed and nourished in no acute distress HENT normal Neck supple with JVP-flat Clear Regular rate and rhythm, no murmurs or gallops Abd-soft with active BS Groin ok No Clubbing cyanosis  edema Skin-warm and dry A & Oriented  Grossly normal sensory and motor function   TELEMETRY: Reviewed personnally pt in Vpacing      Intake/Output Summary (Last 24 hours) at 07/03/16 1303 Last data filed at 07/03/16 0643  Gross per 24 hour  Intake              480 ml  Output             1200 ml  Net             -720 ml    LABS: Basic Metabolic Panel:  Recent Labs Lab 06/27/16 1201  NA 139  K 4.6  CL 101  CO2 23  GLUCOSE 129*  BUN 9  CREATININE 0.89  CALCIUM 9.1   Cardiac Enzymes: No results for input(s): CKTOTAL, CKMB, CKMBINDEX, TROPONINI in the last 72 hours. CBC:  Recent Labs Lab 06/27/16 1201  WBC 5.9  NEUTROABS 2.6  HCT 41.1  MCV 94  PLT 254   PROTIME:  Recent Labs  07/02/16 1213  INR 2.4   Liver Function  Tests: No results for input(s): AST, ALT, ALKPHOS, BILITOT, PROT, ALBUMIN in the last 72 hours. No results for input(s): LIPASE, AMYLASE in the last 72 hours. BNP: BNP (last 3 results) No results for input(s): BNP in the last 8760 hours.  ProBNP (last 3 results) No results for input(s): PROBNP in the last 8760 hours.     ASSESSMENT AND PLAN:  Active Problems:   Atrial fibrillation with rapid ventricular response (HCC) Chest wall Infection/abscess 'Complete heart block  Patient is now status post AV ablation. The plan will be discharged later today. He is to go for operative debridement of his chest wall abscess.  Signed, Virl Axe MD  07/03/2016

## 2016-07-03 NOTE — Care Management Obs Status (Signed)
[place] NOTIFICATION   Patient Details  Name: Jonathan Orozco MRN: [id number] Date of Birth: 10-31-1928   Medicare Observation Status Notification Given:  Yes    Zenon Mayo, RN 07/03/2016, 10:53 AM

## 2016-07-03 NOTE — Brief Op Note (Signed)
07/02/2016 - 07/03/2016  2:37 PM  PATIENT:  Jonathan Orozco  83 y.o. male  PRE-OPERATIVE DIAGNOSIS:  I & D of chest wall abscess  POST-OPERATIVE DIAGNOSIS:  I & D of chest wall abscess  PROCEDURE:  Procedure(s): INCISION AND DRAINAGE of CHEST ABSCESS (N/A)  Removal of sternal wire x 2  SURGEON:  Surgeon(s) and Role:    * [name], MD - Primary  PHYSICIAN ASSISTANT:   ASSISTANTS: none   ANESTHESIA:   local and IV sedation  EBL:  Total I/O In: 600 [I.V.:600] Out: 25 [Blood:25]  BLOOD ADMINISTERED:none  DRAINS: none   LOCAL MEDICATIONS USED:  LIDOCAINE  and Amount: 15 ml  SPECIMEN:  Source of Specimen:  chest wall abscess  DISPOSITION OF SPECIMEN:  Micro- bacterial and AFB  PLAN OF CARE: DC home later today  PATIENT DISPOSITION:  PACU - hemodynamically stable.   Delay start of Pharmacological VTE agent (>24hrs) due to surgical blood loss or risk of bleeding: not applicable

## 2016-07-03 NOTE — Care Management Note (Signed)
Case Management Note  Patient Details  Name: Jonathan Orozco MRN: [id number] Date of Birth: [birth year]  Subjective/Objective:     From home with wife, uses a walker sometimes at home, S/p  AV ablation.  Has [facility] [employer].  Patient will need [facility] for wound care,  Wife stated they would like to work with [facility]. Referral made to [facility], to [place].  Soc will begin 24-48 hrs post dc.   PCP [name]                             Action/Plan: NCM will cont to follow for dc needs.   Expected Discharge Date:                  Expected Discharge Plan:  [place]  In-House Referral:     Discharge planning Services  CM Consult  Post Acute Care Choice:    Choice offered to:     DME Arranged:    DME Agency:     HH Arranged:  RN [place] Agency:  [facility]  Status of Service:  Completed, signed off  If discussed at [place] of Stay Meetings, dates discussed:    Additional Comments:  Zenon Mayo, RN 07/03/2016, 4:38 PM

## 2016-07-03 NOTE — Discharge Summary (Signed)
ELECTROPHYSIOLOGY PROCEDURE DISCHARGE SUMMARY    Patient ID: Jonathan Orozco,  MRN: [id number], DOB/AGE: [birth year] 81 y.o.  Admit date: 07/02/2016 Discharge date: 07/03/2016  Primary Care Physician: Biagio Borg, MD  Primary Cardiologist: Dr. Wynonia Lawman Electrophysiologist: Dr. [name]  Primary Discharge Diagnosis:  1. Atypical AFlutter, difficult rate control     CHA2DS2Vasc is at least 3, on warfarin, monitored and managed with coumadin clinic     S/p AV junction ablation   2. Chest wall abscess     S/p I&D, sternal wire removal x2       Secondary Discharge Diagnosis:  1. CAD 2. HTN 3. HFpEF 4. VT 5. Syptomatic bradycardia w/PPM  Allergies  Allergen Reactions  . Amiodarone Hcl Other (See Comments)    Patient reports photosensitivity  . Ace Inhibitors Cough     Procedures This Admission: 1.  AV junction ablation 07/02/16 with Dr. [name] A total of 1.2 minutes of our energy were applied at sites where the his potential was identified. Complete heart block ensued. The patient had a junctional escape rate at about 40 bpm. The patient's pacemaker was reprogrammed at 90 bpm. Complications none apparent  2. 07/03/16, Dr. Roxan Hockey      INCISION AND DRAINAGE of CHEST ABSCESS (N/A)      Removal of sternal wire x 2   Brief HPI: Jonathan Orozco is a 83 y.o. male with a past medical history as outlined above.  He underwent PPM implant 06/02/16 with hx of symptomatic bradycardia and was planned to undergo AV node ablation for difficult to control Atypical AFlutter which was done yesterday.    Hospital Course:  The patient was admitted and underwent AV junction ablation by Dr. [name]. The day of admission patient brought to our attention a area of fluctuant/rasied fluid collection at the inferior aspect of his thoracotomy scar.  He was seen by Dr. Roxan Hockey who felt it best to do I&D of the area which was done today.  He was monitored on telemetry while here which demonstrated V  paced rhythm.  Groin/ablation site is without complication.  He was examined by Dr. [name] and considered stable for discharge to home post I&D procedure.  Follow up will be arranged for pacemaker reprogramming 2 weeks and 4 weeks, Dr. [name] in 8 weeks.  The patient is sitting up eating, feels very well without complaints.   Dr. Roxan Hockey cleared patient to home once home heath RN has been arranged, no antibiotics, and OK to resume coumadin tomorrow.  Wound care and restrictions were reviewed with the patient prior to discharge for both the ablation site and chest wound. Follow up with Dr. Roxan Hockey has been arranged for 2 weeks.   Home Heath RN chest wound/dressing instructions: Dressing change to chest wounds with normal saline wet to dry 1/4" NuGauze BID  Physical Exam: Vitals:   07/03/16 1435 07/03/16 1445 07/03/16 1500 07/03/16 1515  BP:  98/70 90/67 102/72  Pulse: 89 89 89   Resp: 13 15 16 16   Temp: 97.9 F (36.6 C)  97.3 F (36.3 C) 97.2 F (36.2 C)  TempSrc:    Oral  SpO2: 96% 95% 95% 95%  Weight:      Height:         Discharge Vitals: BP 102/72 (BP Location: Right Arm)   Pulse 89   Temp 97.2 F (36.2 C) (Oral)   Resp 16   Ht 6' (1.829 m)   Wt 178 lb 9.2 oz (81 kg)  SpO2 95%   BMI 24.22 kg/m    Labs:   Lab Results  Component Value Date   WBC 5.9 06/27/2016   HGB 14.5 06/12/2016   HCT 41.1 06/27/2016   MCV 94 06/27/2016   PLT 254 06/27/2016     Recent Labs Lab 06/27/16 1201  NA 139  K 4.6  CL 101  CO2 23  BUN 9  CREATININE 0.89  CALCIUM 9.1  GLUCOSE 129*    Discharge Medications:  Allergies as of 07/03/2016      Reactions   Amiodarone Hcl Other (See Comments)   Patient reports photosensitivity   Ace Inhibitors Cough      Medication List    STOP taking these medications   metoprolol tartrate 50 MG tablet Commonly known as:  LOPRESSOR     TAKE these medications   acetaminophen 500 MG tablet Commonly known as:  TYLENOL Take 1,000 mg  by mouth daily as needed for moderate pain or headache.   atorvastatin 10 MG tablet Commonly known as:  LIPITOR Take 1 tablet (10 mg total) by mouth daily.   diltiazem 120 MG 24 hr capsule Commonly known as:  CARDIZEM CD Take 1 capsule (120 mg total) by mouth daily.   doxylamine (Sleep) 25 MG tablet Commonly known as:  UNISOM Take 25 mg by mouth at bedtime.   fish oil-omega-3 fatty acids 1000 MG capsule Take 1 g by mouth every evening.   furosemide 20 MG tablet Commonly known as:  LASIX Take 1 tablet (20 mg total) by mouth daily as needed for fluid. Notes to patient:  Continue this medication as you have been instructed by the presrcibing physician,no changes are being made   HYDROcodone-acetaminophen 5-325 MG tablet Commonly known as:  NORCO/VICODIN Take 1 tablet by mouth daily as needed for moderate pain (body pain).   METAMUCIL PO 1 TABLESPOON IN A GLASS OF WATER ONCE DAILY   multivitamin tablet Take 1 tablet by mouth daily.   Probiotic Caps Take 1 capsule by mouth daily.   tamsulosin 0.4 MG Caps capsule Commonly known as:  FLOMAX Take 1 capsule (0.4 mg total) by mouth daily.   warfarin 5 MG tablet Commonly known as:  COUMADIN Take as directed by Coumadin clinic. What changed:  how much to take  how to take this  when to take this  additional instructions Notes to patient:  Resume tomorrow 07/04/16 at your usual Friday dose   ZINC OXIDE EX Apply 1 application topically as needed (itching).       Disposition:  Home Discharge Instructions    Diet - low sodium heart healthy    Complete by:  As directed    Increase activity slowly    Complete by:  As directed      Follow-up Information    Deboraha Sprang, MD Follow up on 08/28/2016.   Specialty:  Cardiology Why:  2:15PM Contact information: [address] [address] [phone]        [place] [facility] Follow up on 07/10/2016.   Specialty:   Cardiology Why:  11:45AM, lab draw, coumadin clinic Contact information: [address], [place]       [place] Follow up.   Specialty:  Cardiology Why:  07/17/16 at 12:00PM (noon), pacemaker clinic visit 07/31/16 at 12:00PM (noon), pacemaker clinic visit Contact information: [address] [place] [phone]  [place] [place] Follow up.   Specialty:  Home Health Services Why:  [facility] for wound care Contact information: [place] [address] [phone]        [name], MD Follow up on 07/15/2016.   Specialty:  Cardiothoracic Surgery Why:  1:45PM Contact information: [place] [address] [phone]           Duration of Discharge Encounter: Greater than 30 minutes including physician time.  Venetia Night, PA-C 07/03/2016 4:54 PM

## 2016-07-03 NOTE — Interval H&P Note (Signed)
History and Physical Interval Note:  07/03/2016 1:34 PM  Jonathan Orozco  has presented today for surgery, with the diagnosis of I & D of chest wall abscess  The various methods of treatment have been discussed with the patient and family. After consideration of risks, benefits and other options for treatment, the patient has consented to  Procedure(s): INCISION AND DRAINAGE of CHEST ABSCESS (N/A) as a surgical intervention .  The patient's history has been reviewed, patient examined, no change in status, stable for surgery.  I have reviewed the patient's chart and labs.  Questions were answered to the patient's satisfaction.     [name]

## 2016-07-03 NOTE — Anesthesia Postprocedure Evaluation (Signed)
Anesthesia Post Note  Patient: Jonathan Orozco  Procedure(s) Performed: Procedure(s) (LRB): INCISION AND DRAINAGE of CHEST ABSCESS (N/A)     Anesthesia Post Evaluation  Last Vitals:  Vitals:   07/03/16 1500 07/03/16 1515  BP: 90/67 102/72  Pulse: 89   Resp: 16 16  Temp: 36.3 C 36.2 C    Last Pain:  Vitals:   07/03/16 1515  TempSrc: Oral                 Tiajuana Amass

## 2016-07-03 NOTE — Discharge Instructions (Signed)
Post Heart ablation procedure wound/right groin care/activity instructions: No driving for 1 week.  Keep procedure site clean & dry. If you notice increased pain, swelling, bleeding or pus, call/return!  You may shower, but no soaking baths/hot tubs/pools for 1 week.     Post surgical, chest wound instructions: Home heath nurse will visit for dressing changes/wound care  No lifting over 10 lbs for 2 weeks, no vigorous or sexual activity for 2 weeks, or until cleared to No driving until cleared by surgeon Do not shower/get wound wet until cleared to by Dr. Roxan Hockey If you notice increased pain, swelling, bleeding or pus, call/return!

## 2016-07-03 NOTE — Anesthesia Preprocedure Evaluation (Signed)
Anesthesia Evaluation  Patient identified by MRN, date of birth, ID band Patient awake    Reviewed: Allergy & Precautions, H&P , NPO status , Patient's Chart, lab work & pertinent test results, reviewed documented beta blocker date and time   Airway Mallampati: II  TM Distance: >3 FB Neck ROM: Full    Dental no notable dental hx. (+) Upper Dentures, Edentulous Lower, Dental Advisory Given   Pulmonary neg pulmonary ROS, former smoker,    Pulmonary exam normal breath sounds clear to auscultation       Cardiovascular hypertension, Pt. on medications and Pt. on home beta blockers + CAD, + CABG and + Peripheral Vascular Disease   Rhythm:Regular Rate:Tachycardia  EF Normal   Neuro/Psych Anxiety negative neurological ROS  negative psych ROS   GI/Hepatic negative GI ROS, Neg liver ROS,   Endo/Other  negative endocrine ROS  Renal/GU negative Renal ROS  negative genitourinary   Musculoskeletal  (+) Arthritis , Osteoarthritis,    Abdominal   Peds  Hematology negative hematology ROS (+)   Anesthesia Other Findings   Reproductive/Obstetrics negative OB ROS                             Lab Results  Component Value Date   WBC 5.9 06/27/2016   HGB 14.5 06/12/2016   HCT 41.1 06/27/2016   MCV 94 06/27/2016   PLT 254 06/27/2016   Lab Results  Component Value Date   CREATININE 0.89 06/27/2016   BUN 9 06/27/2016   NA 139 06/27/2016   K 4.6 06/27/2016   CL 101 06/27/2016   CO2 23 06/27/2016    Anesthesia Physical  Anesthesia Plan  ASA: III  Anesthesia Plan: MAC   Post-op Pain Management:    Induction: Intravenous  Airway Management Planned: Simple Face Mask and Natural Airway  Additional Equipment:   Intra-op Plan:   Post-operative Plan:   Informed Consent: I have reviewed the patients History and Physical, chart, labs and discussed the procedure including the risks, benefits and  alternatives for the proposed anesthesia with the patient or authorized representative who has indicated his/her understanding and acceptance.   Dental advisory given  Plan Discussed with: CRNA  Anesthesia Plan Comments:         Anesthesia Quick Evaluation

## 2016-07-04 ENCOUNTER — Ambulatory Visit: Payer: Medicare Other | Admitting: Internal Medicine

## 2016-07-04 ENCOUNTER — Encounter (HOSPITAL_COMMUNITY): Payer: Self-pay | Admitting: Thoracic Surgery (Cardiothoracic Vascular Surgery)

## 2016-07-04 NOTE — Op Note (Signed)
NAMEHENRICK, MCGUE NO.:  [id number]  MEDICAL RECORD NO.:  [id number]  LOCATION:                                 FACILITY:  PHYSICIAN:  Revonda Standard. Roxan Hockey, M.D.DATE OF BIRTH:  Sep 01, 1928  DATE OF PROCEDURE:  07/03/2016 DATE OF DISCHARGE:                              OPERATIVE REPORT   PREOPERATIVE DIAGNOSIS:  Superficial chest wall abscess.  POSTOPERATIVE DIAGNOSIS:  Superficial chest wall abscess.  PROCEDURE:  Incision and drainage of superficial chest wall abscess and sternal wire removal x2.  SURGEON:  Revonda Standard. Roxan Hockey, M.D.  ASSISTANT:  None.  ANESTHESIA:  Local with intravenous sedation.  FINDINGS:  Superficial abscess involving the previous sternal incision, approximately 1.5 cm in diameter, with brownish purulent material. Sternal wire directly below the area, not clearly involved, removed. Lower sternal wire with some tension on the overlying skin removed as well.  CLINICAL NOTE:  Mr. Dulude is an 83 year old man who has had coronary bypass grafting over a year ago.  He was admitted on Jul 02, 2016, for an ablation procedure.  Dr. [name] noted an abscess in the mid portion of his chest involving his scar from his previous sternotomy. The patient was advised to undergo incision and drainage.  The indications, risks, benefits, and alternatives were discussed in detail with the patient.  He accepted the risks and agreed to proceed.  OPERATIVE NOTE:  Mr. Monier was brought to the operating room on Jul 03, 2016.  He was given intravenous antibiotics.  He was given intravenous sedation and monitored by the Anesthesia Service.  The chest was prepped and draped in the usual sterile fashion.  Approximately in the middle of his previous sternal incision, there was a 1.5 cm red raised fluctuant area.  The area around this was anesthetized with 1% lidocaine.  The abscess was incised and brownish-grayish pus was evacuated.  Cultures were sent for  both bacterial and AFB and fungal cultures.  There appeared to be a small subcutaneous cyst.  This area was excised.  There was a wire palpable deep to the infected area that did not appear to be involved but was concerning for potential source of recurrent infection. The tissue overlying this was dissected off.  The wire was cut and removed.  The wound was copiously irrigated with saline.  The edges were debrided to viable skin.  This was a minimal debridement.  Just below this, approximately 2 cm lower, there was an area where a wire was easily palpable under the skin and there appeared to be tension on skin over the wire.  The decision was made to remove this wire as well.  This area was anesthetized with 1% lidocaine. A small incision was made.  The wire was cut and removed.  This incision was copiously irrigated with saline.  Both incisions were packed with iodoform Nu Gauze.  A dressing was applied and the patient was taken to the postanesthetic care unit in good condition.     Revonda Standard Roxan Hockey, M.D.     SCH/MEDQ  D:  07/04/2016  T:  07/04/2016  Job:  [id number]

## 2016-07-07 ENCOUNTER — Telehealth: Payer: Self-pay | Admitting: Internal Medicine

## 2016-07-07 NOTE — Telephone Encounter (Signed)
Nicolette from [facility] called requesting verbal orders for a skilled nurse to go out to see the pt for wound care and also for a PT and OT evaluation.

## 2016-07-08 LAB — AEROBIC/ANAEROBIC CULTURE W GRAM STAIN (SURGICAL/DEEP WOUND)

## 2016-07-08 LAB — AEROBIC/ANAEROBIC CULTURE (SURGICAL/DEEP WOUND): CULTURE: NORMAL

## 2016-07-08 NOTE — Telephone Encounter (Signed)
Notified Nicolette w/MD response../lmb 

## 2016-07-08 NOTE — Telephone Encounter (Signed)
Ok for verbals 

## 2016-07-10 ENCOUNTER — Ambulatory Visit (INDEPENDENT_AMBULATORY_CARE_PROVIDER_SITE_OTHER): Payer: Medicare Other

## 2016-07-10 DIAGNOSIS — Z5181 Encounter for therapeutic drug level monitoring: Secondary | ICD-10-CM | POA: Diagnosis not present

## 2016-07-10 DIAGNOSIS — I4891 Unspecified atrial fibrillation: Secondary | ICD-10-CM | POA: Diagnosis not present

## 2016-07-10 LAB — POCT INR: INR: 2.4

## 2016-07-15 ENCOUNTER — Encounter: Payer: Self-pay | Admitting: Thoracic Surgery (Cardiothoracic Vascular Surgery)

## 2016-07-15 ENCOUNTER — Ambulatory Visit (INDEPENDENT_AMBULATORY_CARE_PROVIDER_SITE_OTHER): Payer: Self-pay | Admitting: Thoracic Surgery (Cardiothoracic Vascular Surgery)

## 2016-07-15 VITALS — BP 138/86 | HR 89 | Resp 16 | Ht 72.0 in | Wt 179.0 lb

## 2016-07-15 DIAGNOSIS — Z951 Presence of aortocoronary bypass graft: Secondary | ICD-10-CM

## 2016-07-15 DIAGNOSIS — I25110 Atherosclerotic heart disease of native coronary artery with unstable angina pectoris: Secondary | ICD-10-CM

## 2016-07-15 DIAGNOSIS — I483 Typical atrial flutter: Secondary | ICD-10-CM

## 2016-07-15 NOTE — Progress Notes (Signed)
[address]       [place] [address]             [phone]    HPI: [name] returns for a scheduled postoperative follow-up visit  [name] is a 83 year old man who had coronary bypass grafting a year ago. He underwent an I and D of a superficial chest wall abscess and removal of 2 sternal wires on 07/03/2016. He has been having the dressings changed by home health nurse and his wife. He has not had any fevers or chills. He has not noted any additional drainage other than a small amount of blood on the dressings.  Past Medical History:  Diagnosis Date  . ABUSE, ALCOHOL, IN REMISSION 04/08/2007  . ALLERGIC RHINITIS 09/29/2006  . Arthritis    "hands" (07/02/2016)  . Atrial fibrillation ([place])   . Atrial fibrillation with RVR ([place]) 07/02/2016  . Atrial flutter ([place]) 03/28/2010   a. s/p prior rfca;  b. recurrent paroxysmal flutter 04/2012;  c. pradaxa initiated 04/2012.  [name] BENIGN PROSTATIC HYPERTROPHY 09/29/2006  . BPPV (benign paroxysmal positional vertigo) 04/08/2007  . CAD (coronary artery disease)    a. LHC 2/17: EF 55-65%, LM 75, pLAD 75, oD1 75, oD2 65, D3 85, oLCx 99, oOM1 75, pRCA 25 >> CABG  . Carotid stenosis    a. Carotid US 2/17:  Bilateral ICA 1-39% ICA  . Chronic lower back pain   . COLONIC POLYPS, HX OF 06/23/2007  . 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 Diverticulosis   . ERECTILE DYSFUNCTION 09/29/2006  . GERD (gastroesophageal reflux disease)   . GLUCOSE INTOLERANCE 03/19/2010  . HYPERLIPIDEMIA 09/29/2006  . HYPERTENSION 09/29/2006  . Long term (current) use of anticoagulants 04/26/2010  . MELANOMA, MALIGNANT, SKIN NOS 09/29/2006   other skin cancers -no further melanoma  . OSTEOARTHROSIS NOS, OTHER [facility] SITE 09/29/2006  . Other specified forms of hearing loss 08/10/2009  . Pneumonia ~ 1969  . Presence of permanent cardiac pacemaker 06/02/2016  . VENTRICULAR TACHYCARDIA 09/29/2006    Current Outpatient Prescriptions  Medication Sig Dispense  Refill  . acetaminophen (TYLENOL) 500 MG tablet Take 1,000 mg by mouth daily as needed for moderate pain or headache.    [name] atorvastatin (LIPITOR) 10 MG tablet Take 1 tablet (10 mg total) by mouth daily. 90 tablet 3  . diltiazem (CARDIZEM CD) 120 MG 24 hr capsule Take 1 capsule (120 mg total) by mouth daily. 90 capsule 3  . doxylamine, Sleep, (UNISOM) 25 MG tablet Take 25 mg by mouth at bedtime.    . fish oil-omega-3 fatty acids 1000 MG capsule Take 1 g by mouth every evening.     . furosemide (LASIX) 20 MG tablet Take 1 tablet (20 mg total) by mouth daily as needed for fluid.    [name] HYDROcodone-acetaminophen (NORCO/VICODIN) 5-325 MG tablet Take 1 tablet by mouth daily as needed for moderate pain (body pain). 90 tablet 0  . Multiple Vitamin (MULTIVITAMIN) tablet Take 1 tablet by mouth daily.      . Psyllium (METAMUCIL PO) 1 TABLESPOON IN A GLASS OF WATER ONCE DAILY    . warfarin (COUMADIN) 5 MG tablet Take as directed by Coumadin clinic. (Patient taking differently: Take 2.5-5 mg by mouth at bedtime. TAKES 5mg  ON SUN, MON, WED AND FRI  TAKES 2.5 mg ON TUES, THURS, AND SAT) 100 tablet 0  . ZINC OXIDE EX Apply 1 application topically as needed (itching).     No  current facility-administered medications for this visit.     Physical Exam BP 138/86 (BP Location: Left Arm, Patient Position: Sitting, Cuff Size: Normal)   Pulse 89   Resp 16   Ht 6' (1.829 m)   Wt 179 lb (81.2 kg)   SpO2 98% Comment: ON RA  BMI 24.57 kg/m  83 year old man in no acute distress Sternal wound with 2 open areas clean and granulating some undermining of incision above and below.  Impression: [name] is a 83 year old man who had a superficial abscess of the sternal wound. I incised and drained that and also removed 2 sternal wires in the vicinity. His wound is clean and healing well. There is no sign of infection. We'll continue local wound care.  Plan: Continue local wound care Return in 3 weeks  Jonathan Nakayama, MD [facility] [phone]

## 2016-07-17 ENCOUNTER — Ambulatory Visit (INDEPENDENT_AMBULATORY_CARE_PROVIDER_SITE_OTHER): Payer: Medicare Other | Admitting: *Deleted

## 2016-07-17 DIAGNOSIS — R001 Bradycardia, unspecified: Secondary | ICD-10-CM

## 2016-07-17 DIAGNOSIS — I4891 Unspecified atrial fibrillation: Secondary | ICD-10-CM | POA: Diagnosis not present

## 2016-07-17 NOTE — Progress Notes (Signed)
Pacemaker check in clinic. Normal device function. Threshold sensing, impedances consistent with previous measurements. Device programmed to maximize longevity. No high ventricular rates noted. Device programmed at appropriate safety margins.  Lower rate dropped from 90bpm to 80bpm and rate response turned on per SK. Estimated longevity 14 years. ROV with device clinic 07/31/12 to turn LR to 70bpm.

## 2016-07-18 DIAGNOSIS — E785 Hyperlipidemia, unspecified: Secondary | ICD-10-CM

## 2016-07-18 DIAGNOSIS — Z9181 History of falling: Secondary | ICD-10-CM | POA: Diagnosis not present

## 2016-07-18 DIAGNOSIS — I2510 Atherosclerotic heart disease of native coronary artery without angina pectoris: Secondary | ICD-10-CM | POA: Diagnosis not present

## 2016-07-18 DIAGNOSIS — F17210 Nicotine dependence, cigarettes, uncomplicated: Secondary | ICD-10-CM | POA: Diagnosis not present

## 2016-07-18 DIAGNOSIS — F419 Anxiety disorder, unspecified: Secondary | ICD-10-CM

## 2016-07-18 DIAGNOSIS — G8929 Other chronic pain: Secondary | ICD-10-CM

## 2016-07-18 DIAGNOSIS — N400 Enlarged prostate without lower urinary tract symptoms: Secondary | ICD-10-CM | POA: Diagnosis not present

## 2016-07-18 DIAGNOSIS — L02213 Cutaneous abscess of chest wall: Secondary | ICD-10-CM | POA: Diagnosis not present

## 2016-07-18 DIAGNOSIS — I4891 Unspecified atrial fibrillation: Secondary | ICD-10-CM | POA: Diagnosis not present

## 2016-07-18 DIAGNOSIS — M1990 Unspecified osteoarthritis, unspecified site: Secondary | ICD-10-CM | POA: Diagnosis not present

## 2016-07-18 DIAGNOSIS — M545 Low back pain: Secondary | ICD-10-CM | POA: Diagnosis not present

## 2016-07-18 DIAGNOSIS — I4892 Unspecified atrial flutter: Secondary | ICD-10-CM | POA: Diagnosis not present

## 2016-07-30 IMAGING — CR DG CHEST 2V
2 series · 2 of 2 positions shown · non-contrast
Comparison: 03/18/2015

CLINICAL DATA: History of prior coronary bypass grafting on
03/14/2015

EXAM:
CHEST  2 VIEW

[w chest pa]
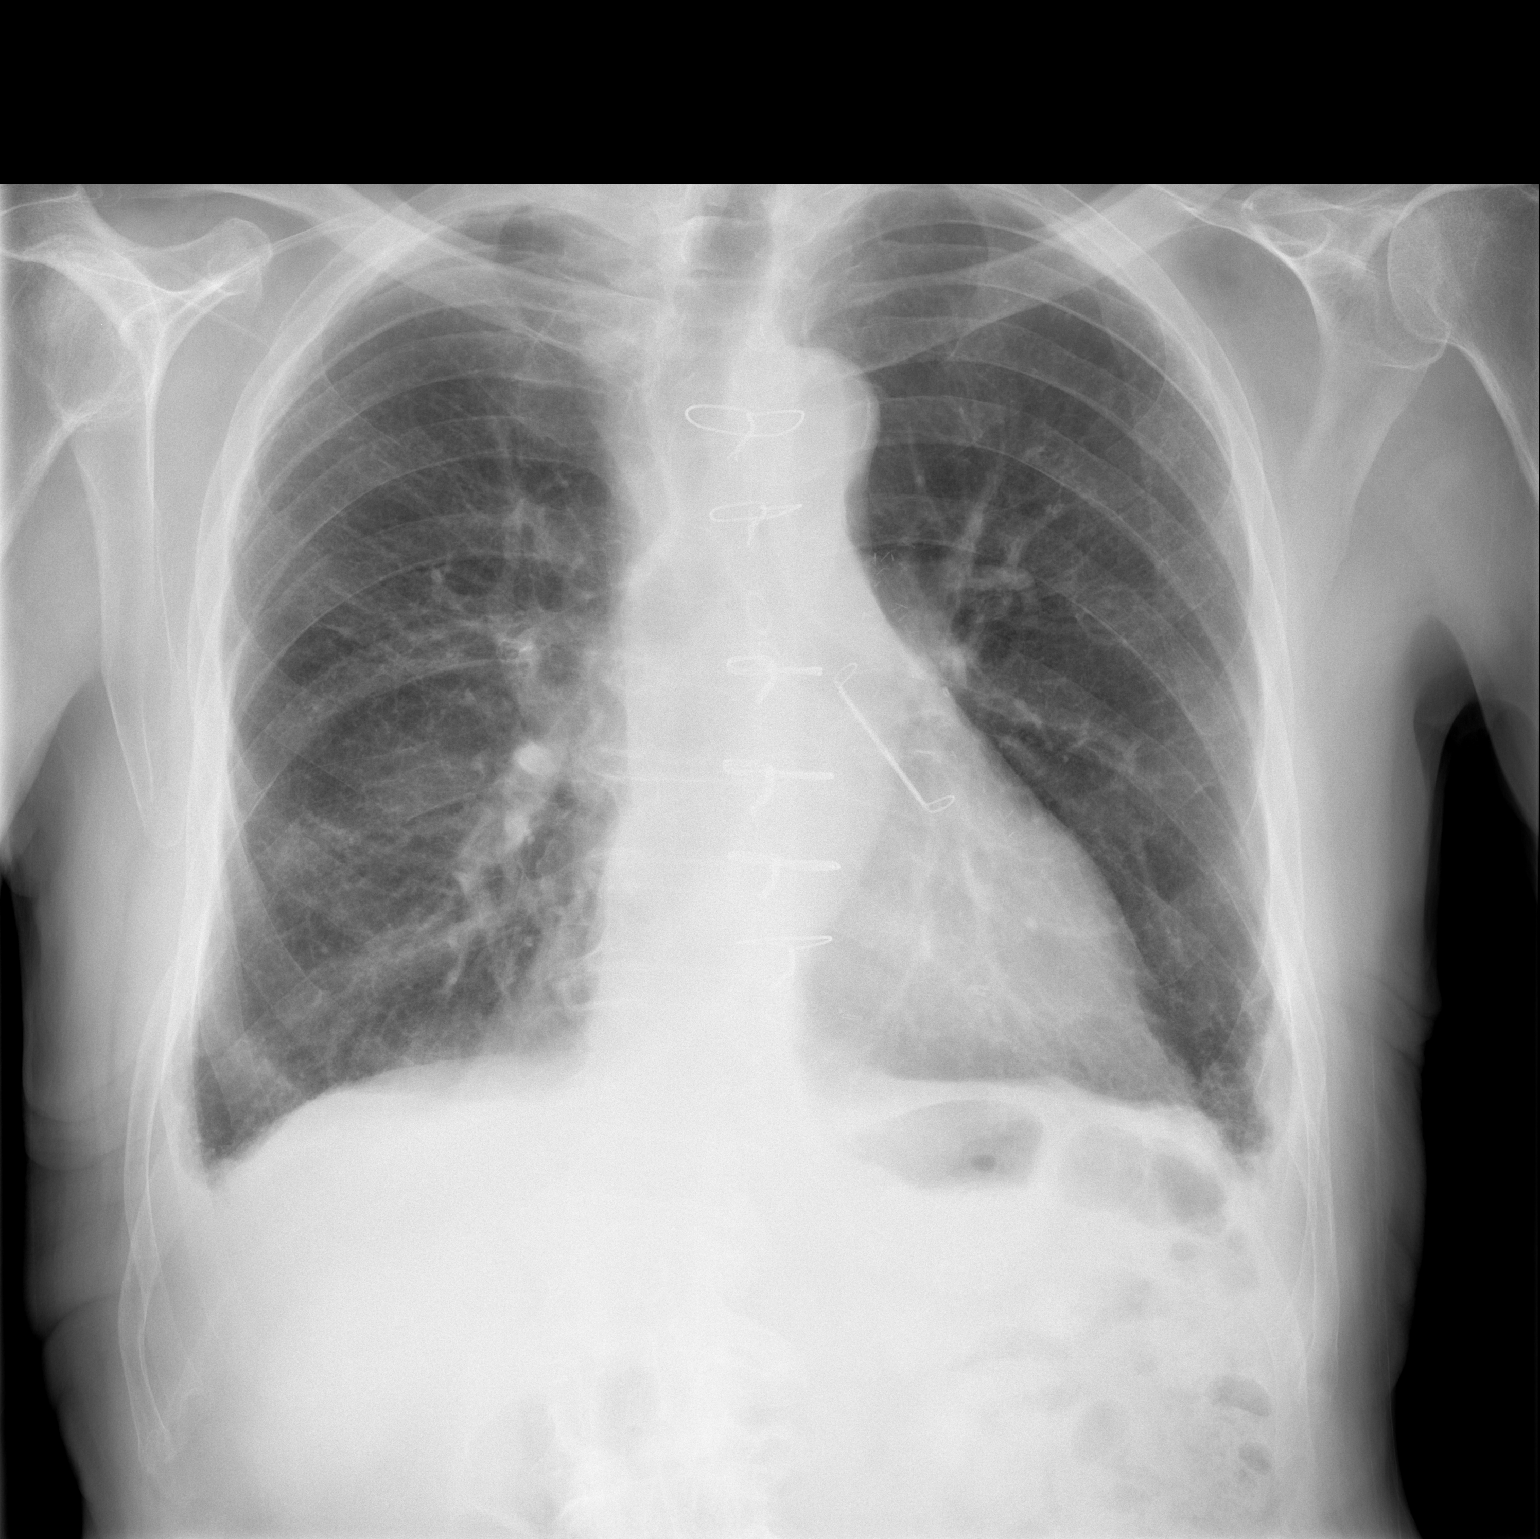

[w chest lat]
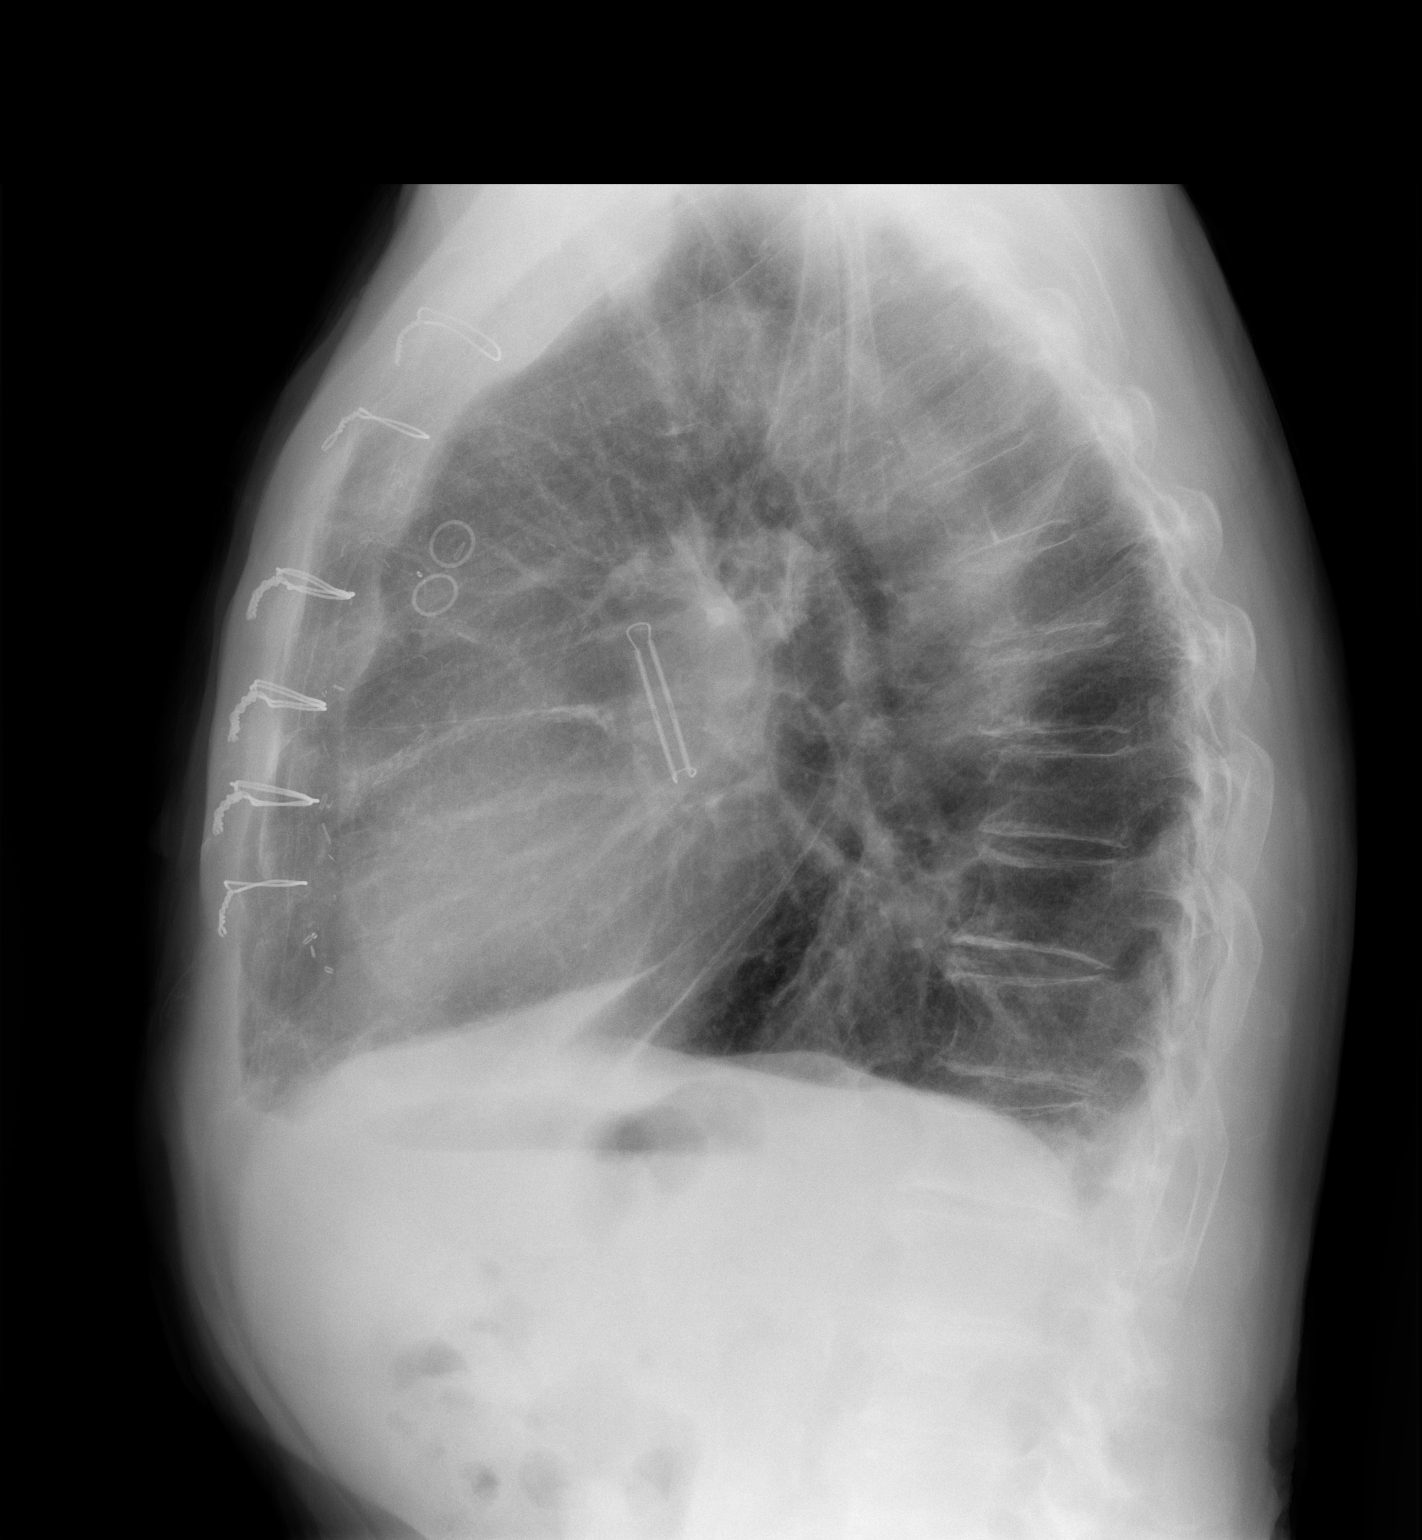

[2 of 2 positions shown; findings below may reference images not displayed]

FINDINGS: Cardiac shadow is stable. Postsurgical changes are again seen and
stable. Lungs are well aerated bilaterally. No focal infiltrate is
noted. Very minimal blunting of the costophrenic angles is seen.
There is been significant improved aeration in the left base.
IMPRESSION: Minimal residual pleural effusion.  No focal infiltrate is seen.

## 2016-07-31 ENCOUNTER — Ambulatory Visit (INDEPENDENT_AMBULATORY_CARE_PROVIDER_SITE_OTHER): Payer: Self-pay | Admitting: *Deleted

## 2016-07-31 ENCOUNTER — Ambulatory Visit (INDEPENDENT_AMBULATORY_CARE_PROVIDER_SITE_OTHER): Payer: Medicare Other | Admitting: Pharmacist

## 2016-07-31 DIAGNOSIS — Z5181 Encounter for therapeutic drug level monitoring: Secondary | ICD-10-CM

## 2016-07-31 DIAGNOSIS — I4891 Unspecified atrial fibrillation: Secondary | ICD-10-CM

## 2016-07-31 LAB — CUP PACEART INCLINIC DEVICE CHECK
Brady Statistic RV Percent Paced: 99.89 %
Date Time Interrogation Session: 20180628115735
Implantable Lead Implant Date: 20180430
Implantable Lead Location: 753860
Implantable Pulse Generator Implant Date: 20180430
Lead Channel Impedance Value: 418 Ohm
Lead Channel Impedance Value: 589 Ohm
Lead Channel Pacing Threshold Amplitude: 0.75 V
Lead Channel Sensing Intrinsic Amplitude: 7.25 mV
MDC IDC MSMT BATTERY REMAINING LONGEVITY: 161 mo
MDC IDC MSMT BATTERY VOLTAGE: 3.15 V
MDC IDC MSMT LEADCHNL RV PACING THRESHOLD PULSEWIDTH: 0.4 ms
MDC IDC MSMT LEADCHNL RV SENSING INTR AMPL: 6.875 mV
MDC IDC SET LEADCHNL RV PACING AMPLITUDE: 3.5 V
MDC IDC SET LEADCHNL RV PACING PULSEWIDTH: 0.4 ms
MDC IDC SET LEADCHNL RV SENSING SENSITIVITY: 0.9 mV

## 2016-07-31 LAB — POCT INR: INR: 5

## 2016-07-31 NOTE — Progress Notes (Signed)
PPM check in clinic for programming change s/p AVN ablation. Lower rate decreased from 80bpm to 70bpm.

## 2016-08-05 ENCOUNTER — Ambulatory Visit (INDEPENDENT_AMBULATORY_CARE_PROVIDER_SITE_OTHER): Payer: Self-pay | Admitting: Thoracic Surgery (Cardiothoracic Vascular Surgery)

## 2016-08-05 ENCOUNTER — Encounter: Payer: Self-pay | Admitting: Thoracic Surgery (Cardiothoracic Vascular Surgery)

## 2016-08-05 VITALS — BP 125/74 | HR 88 | Resp 16 | Ht 72.0 in | Wt 175.4 lb

## 2016-08-05 DIAGNOSIS — I25110 Atherosclerotic heart disease of native coronary artery with unstable angina pectoris: Secondary | ICD-10-CM

## 2016-08-05 DIAGNOSIS — Z951 Presence of aortocoronary bypass graft: Secondary | ICD-10-CM

## 2016-08-05 NOTE — Progress Notes (Signed)
[address]       [place] [address]             [phone]       HPI: Mr. Rackley returns for a scheduled follow-up visit.  Mr. Meckel is an 83 year old man who had coronary bypass grafting about a year ago. He developed a superficial chest wall abscess. I did an incision and drainage removed 2 sternal wires on 07/03/2016. He then was treated with local wound care. I saw him in the office on 07/15/2016. He was doing well at that time with no signs of infection.  His incisions have healed. He is not having any pain in the area.  Says he developed diarrhea about 4 days ago. It resolved after a day, but he had another episode today. Past Medical History:  Diagnosis Date  . ABUSE, ALCOHOL, IN REMISSION 04/08/2007  . ALLERGIC RHINITIS 09/29/2006  . Arthritis    "hands" (07/02/2016)  . Atrial fibrillation ([place])   . Atrial fibrillation with RVR ([place]) 07/02/2016  . Atrial flutter ([place]) 03/28/2010   a. s/p prior rfca;  b. recurrent paroxysmal flutter 04/2012;  c. pradaxa initiated 04/2012.  [name] BENIGN PROSTATIC HYPERTROPHY 09/29/2006  . BPPV (benign paroxysmal positional vertigo) 04/08/2007  . CAD (coronary artery disease)    a. LHC 2/17: EF 55-65%, LM 75, pLAD 75, oD1 75, oD2 65, D3 85, oLCx 99, oOM1 75, pRCA 25 >> CABG  . Carotid stenosis    a. Carotid US 2/17:  Bilateral ICA 1-39% ICA  . Chronic lower back pain   . COLONIC POLYPS, HX OF 06/23/2007  . 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 Diverticulosis   . ERECTILE DYSFUNCTION 09/29/2006  . GERD (gastroesophageal reflux disease)   . GLUCOSE INTOLERANCE 03/19/2010  . HYPERLIPIDEMIA 09/29/2006  . HYPERTENSION 09/29/2006  . Long term (current) use of anticoagulants 04/26/2010  . MELANOMA, MALIGNANT, SKIN NOS 09/29/2006   other skin cancers -no further melanoma  . OSTEOARTHROSIS NOS, OTHER [facility] SITE 09/29/2006  . Other specified forms of hearing loss 08/10/2009  . Pneumonia ~ 1969  . Presence of permanent cardiac  pacemaker 06/02/2016  . VENTRICULAR TACHYCARDIA 09/29/2006    Current Outpatient Prescriptions  Medication Sig Dispense Refill  . acetaminophen (TYLENOL) 500 MG tablet Take 1,000 mg by mouth daily as needed for moderate pain or headache.    [name] atorvastatin (LIPITOR) 10 MG tablet Take 1 tablet (10 mg total) by mouth daily. 90 tablet 3  . diltiazem (CARDIZEM CD) 120 MG 24 hr capsule Take 1 capsule (120 mg total) by mouth daily. 90 capsule 3  . doxylamine, Sleep, (UNISOM) 25 MG tablet Take 25 mg by mouth at bedtime.    . fish oil-omega-3 fatty acids 1000 MG capsule Take 1 g by mouth every evening.     . furosemide (LASIX) 20 MG tablet Take 1 tablet (20 mg total) by mouth daily as needed for fluid.    [name] HYDROcodone-acetaminophen (NORCO/VICODIN) 5-325 MG tablet Take 1 tablet by mouth daily as needed for moderate pain (body pain). 90 tablet 0  . Multiple Vitamin (MULTIVITAMIN) tablet Take 1 tablet by mouth daily.      . Psyllium (METAMUCIL PO) 1 TABLESPOON IN A GLASS OF WATER ONCE DAILY    . warfarin (COUMADIN) 5 MG tablet Take as directed by Coumadin clinic. (Patient taking differently: Take 2.5-5 mg by mouth at bedtime. TAKES 5mg  ON SUN, MON, WED AND FRI  TAKES 2.5 mg ON  TUES, THURS, AND SAT) 100 tablet 0  . ZINC OXIDE EX Apply 1 application topically as needed (itching).     No current facility-administered medications for this visit.     Physical Exam BP 125/74 (BP Location: Right Arm, Patient Position: Sitting, Cuff Size: Normal)   Pulse 88   Resp 16   Ht 6' (1.829 m)   Wt 175 lb 6.4 oz (79.6 kg)   SpO2 98% Comment: RA  BMI 23.7 kg/m  83 year old man in no acute distress Alert and oriented 3 with no focal deficits Sternal incision with minimal eschar at the 2 sites, no erythema or induration   Impression: Mr. Copes is an 83 year old man who developed a superficial chest wall abscess about a year after undergoing coronary bypass grafting. He had incision and drainage in his  incisions now have healed by secondary intention.  I advised him to contact his medical doctor if his diarrhea persists.  Plan: I will be happy to see Mr. Baggerly back any time if I can be of any further assistance with his care  [name], MD [facility] [phone]

## 2016-08-11 ENCOUNTER — Ambulatory Visit (INDEPENDENT_AMBULATORY_CARE_PROVIDER_SITE_OTHER): Payer: Medicare Other | Admitting: *Deleted

## 2016-08-11 DIAGNOSIS — Z5181 Encounter for therapeutic drug level monitoring: Secondary | ICD-10-CM

## 2016-08-11 DIAGNOSIS — I4891 Unspecified atrial fibrillation: Secondary | ICD-10-CM

## 2016-08-11 LAB — POCT INR: INR: 2.8

## 2016-08-28 ENCOUNTER — Ambulatory Visit (INDEPENDENT_AMBULATORY_CARE_PROVIDER_SITE_OTHER): Payer: Medicare Other | Admitting: Internal Medicine

## 2016-08-28 ENCOUNTER — Encounter: Payer: Self-pay | Admitting: Internal Medicine

## 2016-08-28 ENCOUNTER — Ambulatory Visit (INDEPENDENT_AMBULATORY_CARE_PROVIDER_SITE_OTHER): Payer: Medicare Other | Admitting: Pharmacist

## 2016-08-28 VITALS — BP 120/68 | HR 86 | Ht 72.0 in | Wt 176.0 lb

## 2016-08-28 DIAGNOSIS — I480 Paroxysmal atrial fibrillation: Secondary | ICD-10-CM | POA: Diagnosis not present

## 2016-08-28 DIAGNOSIS — I4892 Unspecified atrial flutter: Secondary | ICD-10-CM

## 2016-08-28 DIAGNOSIS — Z5181 Encounter for therapeutic drug level monitoring: Secondary | ICD-10-CM | POA: Diagnosis not present

## 2016-08-28 DIAGNOSIS — R001 Bradycardia, unspecified: Secondary | ICD-10-CM | POA: Diagnosis not present

## 2016-08-28 DIAGNOSIS — I4891 Unspecified atrial fibrillation: Secondary | ICD-10-CM

## 2016-08-28 DIAGNOSIS — Z950 Presence of cardiac pacemaker: Secondary | ICD-10-CM

## 2016-08-28 LAB — POCT INR: INR: 2.4

## 2016-08-28 MED ORDER — WARFARIN SODIUM 5 MG PO TABS: ORAL_TABLET | ORAL | 0 refills | Status: DC

## 2016-08-28 NOTE — Patient Instructions (Addendum)
Medication Instructions:  Your physician has recommended you make the following change in your medication:  1. STOP Cardizem  --- If you need a refill on your cardiac medications before your next appointment, please call your pharmacy. ---  Labwork: None ordered  Testing/Procedures: None ordered  Follow-Up: You transmissions are automatic.  Your physician recommends that you schedule a follow-up appointment in: 4 months with Tommye Standard, PA-C   Any Other Special Instructions Will Be Listed Below (If Applicable).   Thank you for choosing [facility]!!

## 2016-08-28 NOTE — Progress Notes (Signed)
Patient Care Team: Biagio Borg, MD as PCP - General   HPI  Jonathan Orozco is a 82 y.o. male Seen in followup for outflow tract ectopy and atrial flutters for which  he underwent EP testing and 3-D mapping which failed to produce stable arrhythmias; medical therapy was undertaken.  He has been intolerant of amiodarone. 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    He has a history of recurrent atrial arrhythmias with rapid response which we have been unable to control the rhythm strategies for rate strategy. 5/18 he underwent AV junction ablation following  pacemaker implantation     He was cardioverted twice last fall while on amio with recurrent atrial flutter-two different ones, one clearly not typical the other not clearly typical, but close.  Mostly assoc with RVR HR >110 (Personally reviewed  )   He was seen in the afib clinic and with some frustration ended up going to see Dr Judith Part who called me and encouraged the pt to followup here, with the caveat that we would minimize extender confusions  We discussed dofetilide and amio level 04/16/16  was obtained ( 0.9)    2/17 he presented with exertional chest discomfort and underwent catheterization.  1. LM lesion, 75% stenosed. 2. Ost Cx lesion, 99% stenosed. 3. Ost 1st Mrg to 1st Mrg lesion, 75% stenosed. 4. Prox LAD to Mid LAD lesion, 75% stenosed. 5. Ost 1st Diag lesion, 75% stenosed. 6. Ost 2nd Diag to 2nd Diag lesion, 65% stenosed.        3rd Diag lesion, 85% stenosed. LV function was normal. He underwent bypass surgery with LA clipping but no MAZE  He has done significantly better following AV junction ablation because of uncontrolled atrial fibrillation   Past Medical History:  Diagnosis Date  . ABUSE, ALCOHOL, IN REMISSION 04/08/2007  . ALLERGIC RHINITIS 09/29/2006  . Arthritis    "hands" (07/02/2016)  . Atrial fibrillation ([place])   . Atrial fibrillation with  RVR ([place]) 07/02/2016  . Atrial flutter ([place]) 03/28/2010   a. s/p prior rfca;  b. recurrent paroxysmal flutter 04/2012;  c. pradaxa initiated 04/2012.  [name] BENIGN PROSTATIC HYPERTROPHY 09/29/2006  . BPPV (benign paroxysmal positional vertigo) 04/08/2007  . CAD (coronary artery disease)    a. LHC 2/17: EF 55-65%, LM 75, pLAD 75, oD1 75, oD2 65, D3 85, oLCx 99, oOM1 75, pRCA 25 >> CABG  . Carotid stenosis    a. Carotid US 2/17:  Bilateral ICA 1-39% ICA  . Chronic lower back pain   . COLONIC POLYPS, HX OF 06/23/2007  . 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 Diverticulosis   . ERECTILE DYSFUNCTION 09/29/2006  . GERD (gastroesophageal reflux disease)   . GLUCOSE INTOLERANCE 03/19/2010  . HYPERLIPIDEMIA 09/29/2006  . HYPERTENSION 09/29/2006  . Long term (current) use of anticoagulants 04/26/2010  . MELANOMA, MALIGNANT, SKIN NOS 09/29/2006   other skin cancers -no further melanoma  . OSTEOARTHROSIS NOS, OTHER [facility] SITE 09/29/2006  . Other specified forms of hearing loss 08/10/2009  . Pneumonia ~ 1969  . Presence of permanent cardiac pacemaker 06/02/2016  . VENTRICULAR TACHYCARDIA 09/29/2006    Past Surgical History:  Procedure Laterality Date  . AV NODE ABLATION  07/02/2016  . AV NODE ABLATION N/A 07/02/2016   Procedure: AV Node Ablation;  Surgeon: Deboraha Sprang, MD;  Location: [place] CV [facility];  Service: Cardiovascular;  Laterality: N/A;  . CARDIAC CATHETERIZATION N/A 03/12/2015  Procedure: Left Heart Cath and Coronary Angiography;  Surgeon: [name], MD;  Location: [place] [facility];  Service: Cardiovascular;  Laterality: N/A;  . CARDIOVERSION N/A 12/19/2015   Procedure: CARDIOVERSION;  Surgeon: [name], MD;  Location: [facility] ENDOSCOPY;  Service: Cardiovascular;  Laterality: N/A;  . CARDIOVERSION N/A 03/24/2016   Procedure: CARDIOVERSION;  Surgeon: [name], MD;  Location: [place];  Service: Cardiovascular;  Laterality: N/A;  . CATARACT EXTRACTION W/ INTRAOCULAR LENS   IMPLANT, BILATERAL Bilateral   . CLIPPING OF ATRIAL APPENDAGE N/A 03/14/2015   Procedure: CLIPPING OF ATRIAL APPENDAGE;  Surgeon: [name], MD;  Location: [place];  Service: Open Heart Surgery;  Laterality: N/A;  . COLONOSCOPY WITH PROPOFOL N/A 12/08/2014   Procedure: COLONOSCOPY WITH PROPOFOL;  Surgeon: [name], MD;  Location: [facility] ENDOSCOPY;  Service: Endoscopy;  Laterality: N/A;  . CORONARY ARTERY BYPASS GRAFT N/A 03/14/2015   Procedure: CORONARY ARTERY BYPASS GRAFTING (CABG) x 4 (LIMA to LAD, SVG to DIAGONAL 2, SVG SEQUENTIALLY to OM1 and OM2);  Surgeon: [name], MD;  Location: [place];  Service: Open Heart Surgery;  Laterality: N/A;  . INCISION AND DRAINAGE N/A 07/03/2016   Procedure: INCISION AND DRAINAGE of CHEST ABSCESS;  Surgeon: [name], MD;  Location: [place];  Service: Thoracic;  Laterality: N/A;  . INSERT / REPLACE / REMOVE PACEMAKER  06/02/2016  . JOINT REPLACEMENT    . KNEE ARTHROSCOPY Left   . MELANOMA EXCISION     "upper back"  . PACEMAKER IMPLANT N/A 06/02/2016   Procedure: Pacemaker Implant;  Surgeon: Deboraha Sprang, MD;  Location: [place] [facility];  Service: Cardiovascular;  Laterality: N/A;  . SHOULDER OPEN ROTATOR CUFF REPAIR Left   . SKIN CANCER EXCISION Right    "cheek; real deep"  . TEE WITHOUT CARDIOVERSION N/A 03/14/2015   Procedure: TRANSESOPHAGEAL ECHOCARDIOGRAM (TEE);  Surgeon: [name], MD;  Location: [place];  Service: Open Heart Surgery;  Laterality: N/A;  . TEE WITHOUT CARDIOVERSION N/A 12/19/2015   Procedure: TRANSESOPHAGEAL ECHOCARDIOGRAM (TEE);  Surgeon: [name], MD;  Location: [place];  Service: Cardiovascular;  Laterality: N/A;  . TEE WITHOUT CARDIOVERSION N/A 03/24/2016   Procedure: TRANSESOPHAGEAL ECHOCARDIOGRAM (TEE);  Surgeon: [name], MD;  Location: [place];  Service: Cardiovascular;  Laterality: N/A;  . TONSILLECTOMY    . TOTAL KNEE ARTHROPLASTY Right     Current Outpatient  Prescriptions  Medication Sig Dispense Refill  . atorvastatin (LIPITOR) 10 MG tablet Take 1 tablet (10 mg total) by mouth daily. 90 tablet 3  . diltiazem (CARDIZEM CD) 120 MG 24 hr capsule Take 1 capsule (120 mg total) by mouth daily. 90 capsule 3  . doxylamine, Sleep, (UNISOM) 25 MG tablet Take 25 mg by mouth at bedtime.    . fish oil-omega-3 fatty acids 1000 MG capsule Take 1 g by mouth every evening.     [name] HYDROcodone-acetaminophen (NORCO/VICODIN) 5-325 MG tablet Take 1 tablet by mouth daily as needed for moderate pain (body pain). 90 tablet 0  . Multiple Vitamin (MULTIVITAMIN) tablet Take 1 tablet by mouth daily.      . Psyllium (METAMUCIL PO) 1 TABLESPOON IN A GLASS OF WATER ONCE DAILY    . warfarin (COUMADIN) 5 MG tablet Take as directed by Coumadin clinic. 100 tablet 0  . ZINC OXIDE EX Apply 1 application topically as needed (itching).     No current facility-administered medications for this visit.  Allergies  Allergen Reactions  . Amiodarone Hcl Other (See Comments)    Patient reports photosensitivity  . Ace Inhibitors Cough    Review of Systems negative except from HPI and PMH  Physical Exam BP 120/68   Pulse 86   Ht 6' (1.829 m)   Wt 176 lb (79.8 kg)   SpO2 97%   BMI 23.87 kg/m  Well developed and nourished in no acute distress HENT normal Neck supple with JVP-flat Carotids brisk and full without bruits Clear Device pocket well healed; without hematoma or erythema.  There is no tethering  Regular rate and rhythm, no murmurs or gallops Abd-soft with active BS without hepatomegaly No Clubbing cyanosis edema Skin-warm and dry A & Oriented  Grossly normal sensory and motor function   ECG demonstrates Vpacing 70    Assessment and  Plan  Atrial fibrillation/flutter persistent  Sinus bradycardia  Hypertension  CAD s/p CABG   HFpEF     PM [employer] The patient's device was interrogated and the information was fully reviewed.  The device was  reprogrammed to maximize longevity     Euvolemic continue current meds  Without symptoms of ischemia  BP is lower, and with balance issues we will stop his CCB    Will refer to PCP for consideration of PT for balance  On Anticoagulation;  No bleeding issues

## 2016-08-29 LAB — CUP PACEART INCLINIC DEVICE CHECK
Battery Remaining Longevity: 158 mo
Battery Voltage: 3.15 V
Implantable Lead Implant Date: 20180430
Implantable Lead Location: 753860
Implantable Pulse Generator Implant Date: 20180430
Lead Channel Impedance Value: 418 Ohm
Lead Channel Pacing Threshold Amplitude: 1 V
Lead Channel Pacing Threshold Pulse Width: 0.4 ms
Lead Channel Setting Sensing Sensitivity: 0.9 mV
MDC IDC MSMT LEADCHNL RV IMPEDANCE VALUE: 665 Ohm
MDC IDC MSMT LEADCHNL RV SENSING INTR AMPL: 6.875 mV
MDC IDC SESS DTM: 20180726190453
MDC IDC SET LEADCHNL RV PACING AMPLITUDE: 2.5 V
MDC IDC SET LEADCHNL RV PACING PULSEWIDTH: 0.4 ms
MDC IDC STAT BRADY RV PERCENT PACED: 99.93 %

## 2016-08-29 NOTE — Progress Notes (Unsigned)
Subject consented to enrolled on 08/20/2016 [employer] Eval. [employer] on subject phone. Teaching provided to subject along with [employer] contact number. Subject verbalized understanding

## 2016-09-04 ENCOUNTER — Ambulatory Visit (INDEPENDENT_AMBULATORY_CARE_PROVIDER_SITE_OTHER): Payer: Self-pay | Admitting: *Deleted

## 2016-09-04 DIAGNOSIS — Z950 Presence of cardiac pacemaker: Secondary | ICD-10-CM

## 2016-09-05 ENCOUNTER — Encounter: Payer: Self-pay | Admitting: Cardiology

## 2016-09-05 LAB — FUNGUS CULTURE WITH STAIN

## 2016-09-05 LAB — ACID FAST CULTURE WITH REFLEXED SENSITIVITIES: ACID FAST CULTURE - AFSCU3: NEGATIVE

## 2016-09-05 LAB — FUNGAL ORGANISM REFLEX

## 2016-09-05 LAB — ACID FAST SMEAR (AFB)

## 2016-09-05 LAB — ACID FAST SMEAR (AFB, MYCOBACTERIA): Acid Fast Smear: NEGATIVE

## 2016-09-05 LAB — ACID FAST CULTURE WITH REFLEXED SENSITIVITIES (MYCOBACTERIA)

## 2016-09-05 LAB — FUNGUS CULTURE RESULT

## 2016-09-05 NOTE — Progress Notes (Signed)
Remote pacemaker transmission.   

## 2016-09-17 ENCOUNTER — Telehealth: Payer: Self-pay | Admitting: Internal Medicine

## 2016-09-17 NOTE — Telephone Encounter (Signed)
Transmission received.

## 2016-09-17 NOTE — Telephone Encounter (Signed)
Had pt's wife send in an "unscheduled transmission"

## 2016-09-17 NOTE — Telephone Encounter (Signed)
New message  Wife calling   1. Has your device fired? No   2. Is you device beeping? No   3. Are you experiencing draining or swelling at device site? No   4. Are you calling to see if we received your device transmission? The app is saying patient has not been connected for 4 days   5. Have you passed out? No

## 2016-09-18 ENCOUNTER — Ambulatory Visit (INDEPENDENT_AMBULATORY_CARE_PROVIDER_SITE_OTHER): Payer: Self-pay | Admitting: *Deleted

## 2016-09-18 ENCOUNTER — Ambulatory Visit (INDEPENDENT_AMBULATORY_CARE_PROVIDER_SITE_OTHER): Payer: Medicare Other | Admitting: Internal Medicine

## 2016-09-18 ENCOUNTER — Ambulatory Visit (INDEPENDENT_AMBULATORY_CARE_PROVIDER_SITE_OTHER): Payer: Medicare Other | Admitting: *Deleted

## 2016-09-18 ENCOUNTER — Encounter: Payer: Self-pay | Admitting: Internal Medicine

## 2016-09-18 VITALS — BP 116/72 | HR 76 | Temp 98.0°F | Ht 72.0 in | Wt 176.0 lb

## 2016-09-18 DIAGNOSIS — M545 Low back pain: Secondary | ICD-10-CM

## 2016-09-18 DIAGNOSIS — Z5181 Encounter for therapeutic drug level monitoring: Secondary | ICD-10-CM

## 2016-09-18 DIAGNOSIS — G8929 Other chronic pain: Secondary | ICD-10-CM

## 2016-09-18 DIAGNOSIS — R269 Unspecified abnormalities of gait and mobility: Secondary | ICD-10-CM

## 2016-09-18 DIAGNOSIS — R001 Bradycardia, unspecified: Secondary | ICD-10-CM

## 2016-09-18 DIAGNOSIS — Z950 Presence of cardiac pacemaker: Secondary | ICD-10-CM

## 2016-09-18 DIAGNOSIS — I4891 Unspecified atrial fibrillation: Secondary | ICD-10-CM | POA: Diagnosis not present

## 2016-09-18 LAB — POCT INR: INR: 2

## 2016-09-18 MED ORDER — HYDROCODONE-ACETAMINOPHEN 10-325 MG PO TABS: 1.0000 | ORAL_TABLET | Freq: Every day | ORAL | 0 refills | Status: DC | PRN

## 2016-09-18 MED ORDER — HYDROCODONE-ACETAMINOPHEN 10-325 MG PO TABS: 1.0000 | ORAL_TABLET | Freq: Three times a day (TID) | ORAL | 0 refills | Status: DC | PRN

## 2016-09-18 NOTE — Progress Notes (Signed)
Subjective:    Patient ID: Jonathan Orozco, male    DOB: 1928/12/16, 83 y.o.   MRN: [id number]  HPI    Here to f/u, Pt continues to have recurring LBP without change in severity, bowel or bladder change, fever, wt loss,  worsening LE pain/numbness/weakness, gait change or falls, but pain overall not well controlled, most days 7/10.  Tends to make him more off balance. Wt Readings from Last 3 Encounters:  09/18/16 176 lb (79.8 kg)  08/28/16 176 lb (79.8 kg)  08/05/16 175 lb 6.4 oz (79.6 kg)   BP Readings from Last 3 Encounters:  09/18/16 116/72  08/28/16 120/68  08/05/16 125/74   Past Medical History:  Diagnosis Date  . ABUSE, ALCOHOL, IN REMISSION 04/08/2007  . ALLERGIC RHINITIS 09/29/2006  . Arthritis    "hands" (07/02/2016)  . Atrial fibrillation ([place])   . Atrial fibrillation with RVR ([place]) 07/02/2016  . Atrial flutter ([place]) 03/28/2010   a. s/p prior rfca;  b. recurrent paroxysmal flutter 04/2012;  c. pradaxa initiated 04/2012.  [name] BENIGN PROSTATIC HYPERTROPHY 09/29/2006  . BPPV (benign paroxysmal positional vertigo) 04/08/2007  . CAD (coronary artery disease)    a. LHC 2/17: EF 55-65%, LM 75, pLAD 75, oD1 75, oD2 65, D3 85, oLCx 99, oOM1 75, pRCA 25 >> CABG  . Carotid stenosis    a. Carotid US 2/17:  Bilateral ICA 1-39% ICA  . Chronic lower back pain   . COLONIC POLYPS, HX OF 06/23/2007  . 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 Diverticulosis   . ERECTILE DYSFUNCTION 09/29/2006  . GERD (gastroesophageal reflux disease)   . GLUCOSE INTOLERANCE 03/19/2010  . HYPERLIPIDEMIA 09/29/2006  . HYPERTENSION 09/29/2006  . Long term (current) use of anticoagulants 04/26/2010  . MELANOMA, MALIGNANT, SKIN NOS 09/29/2006   other skin cancers -no further melanoma  . OSTEOARTHROSIS NOS, OTHER [facility] SITE 09/29/2006  . Other specified forms of hearing loss 08/10/2009  . Pneumonia ~ 1969  . Presence of permanent cardiac pacemaker 06/02/2016  . VENTRICULAR TACHYCARDIA 09/29/2006   Past Surgical History:   Procedure Laterality Date  . AV NODE ABLATION  07/02/2016  . AV NODE ABLATION N/A 07/02/2016   Procedure: AV Node Ablation;  Surgeon: Deboraha Sprang, MD;  Location: [place] [facility];  Service: Cardiovascular;  Laterality: N/A;  . CARDIAC CATHETERIZATION N/A 03/12/2015   Procedure: Left Heart Cath and Coronary Angiography;  Surgeon: [name], MD;  Location: [employer] CV LAB;  Service: Cardiovascular;  Laterality: N/A;  . CARDIOVERSION N/A 12/19/2015   Procedure: CARDIOVERSION;  Surgeon: [name], MD;  Location: [facility] ENDOSCOPY;  Service: Cardiovascular;  Laterality: N/A;  . CARDIOVERSION N/A 03/24/2016   Procedure: CARDIOVERSION;  Surgeon: [name], MD;  Location: [place];  Service: Cardiovascular;  Laterality: N/A;  . CATARACT EXTRACTION W/ INTRAOCULAR LENS  IMPLANT, BILATERAL Bilateral   . CLIPPING OF ATRIAL APPENDAGE N/A 03/14/2015   Procedure: CLIPPING OF ATRIAL APPENDAGE;  Surgeon: [name], MD;  Location: [place];  Service: Open Heart Surgery;  Laterality: N/A;  . COLONOSCOPY WITH PROPOFOL N/A 12/08/2014   Procedure: COLONOSCOPY WITH PROPOFOL;  Surgeon: [name], MD;  [facility]: [facility]OS[facility];  Service: Endoscopy;  Laterality: N/A;  . CORONARY ARTERY BYPASS GRAFT N/A 03/14/2015   Procedure: CORONARY ARTERY BYPASS GRAFTING (CABG) x 4 (LIMA to LAD, SVG to DIAGONAL 2, SVG SEQUENTIALLY to OM1 and OM2);  Surgeon: [name], MD;  Location: [place];  Service: Open Heart  Surgery;  Laterality: N/A;  . INCISION AND DRAINAGE N/A 07/03/2016   Procedure: INCISION AND DRAINAGE of CHEST ABSCESS;  Surgeon: [name], MD;  Location: [place];  Service: Thoracic;  Laterality: N/A;  . INSERT / REPLACE / REMOVE PACEMAKER  06/02/2016  . JOINT REPLACEMENT    . KNEE ARTHROSCOPY Left   . MELANOMA EXCISION     "upper back"  . PACEMAKER IMPLANT N/A 06/02/2016   Procedure: Pacemaker Implant;  Surgeon: Deboraha Sprang, MD;  Location: [place] [facility];  Service:  Cardiovascular;  Laterality: N/A;  . SHOULDER OPEN ROTATOR CUFF REPAIR Left   . SKIN CANCER EXCISION Right    "cheek; real deep"  . TEE WITHOUT CARDIOVERSION N/A 03/14/2015   Procedure: TRANSESOPHAGEAL ECHOCARDIOGRAM (TEE);  Surgeon: [name], MD;  Location: [place];  Service: Open Heart Surgery;  Laterality: N/A;  . TEE WITHOUT CARDIOVERSION N/A 12/19/2015   Procedure: TRANSESOPHAGEAL ECHOCARDIOGRAM (TEE);  Surgeon: [name], MD;  Location: [place];  Service: Cardiovascular;  Laterality: N/A;  . TEE WITHOUT CARDIOVERSION N/A 03/24/2016   Procedure: TRANSESOPHAGEAL ECHOCARDIOGRAM (TEE);  Surgeon: [name], MD;  Location: [place];  Service: Cardiovascular;  Laterality: N/A;  . TONSILLECTOMY    . TOTAL KNEE ARTHROPLASTY Right     reports that he has quit smoking. His smoking use included Cigarettes. He has a 52.00 pack-year smoking history. He has never used smokeless tobacco. He reports that he drinks alcohol. He reports that he does not use drugs. family history includes Diabetes in his brother; Esophageal cancer in his father. Allergies  Allergen Reactions  . Amiodarone Hcl Other (See Comments)    Patient reports photosensitivity  . Ace Inhibitors Cough   Current Outpatient Prescriptions on File Prior to Visit  Medication Sig Dispense Refill  . atorvastatin (LIPITOR) 10 MG tablet Take 1 tablet (10 mg total) by mouth daily. 90 tablet 3  . doxylamine, Sleep, (UNISOM) 25 MG tablet Take 25 mg by mouth at bedtime.    . fish oil-omega-3 fatty acids 1000 MG capsule Take 1 g by mouth every evening.     . Multiple Vitamin (MULTIVITAMIN) tablet Take 1 tablet by mouth daily.      . Psyllium (METAMUCIL PO) 1 TABLESPOON IN A GLASS OF WATER ONCE DAILY    . warfarin (COUMADIN) 5 MG tablet Take as directed by Coumadin clinic. 100 tablet 0  . ZINC OXIDE EX Apply 1 application topically as needed (itching).     No current facility-administered medications on file prior to  visit.    Review of Systems All otherwise neg per pt    Objective:   Physical Exam BP 116/72   Pulse 76   Temp 98 F (36.7 C)   Ht 6' (1.829 m)   Wt 176 lb (79.8 kg)   SpO2 98%   BMI 23.87 kg/m  VS noted,  Constitutional: Pt appears in NAD HENT: Head: NCAT.  Right Ear: External ear normal.  Left Ear: External ear normal.  Eyes: . Pupils are equal, round, and reactive to light. Conjunctivae and EOM are normal Nose: without d/c or deformity Neck: Neck supple. Gross normal ROM Cardiovascular: Normal rate and regular rhythm.   Pulmonary/Chest: Effort normal and breath sounds without rales or wheezing.  Spine: mod tender lowest lumbar midline and left lumbar paravertebral Neurological: Pt is alert. At baseline orientation, motor grossly intact Skin: Skin is warm. No rashes, other new lesions, no LE edema Psychiatric: Pt behavior is normal  without agitation  No other exam findings  Lab Results  Component Value Date   WBC 5.9 06/27/2016   HGB 13.8 06/27/2016   HCT 41.1 06/27/2016   PLT 254 06/27/2016   GLUCOSE 129 (H) 06/27/2016   CHOL 111 06/12/2016   TRIG 59.0 06/12/2016   HDL 47.90 06/12/2016   LDLDIRECT 159.2 09/29/2006   LDLCALC 51 06/12/2016   ALT 23 03/11/2016   AST 26 03/11/2016   NA 139 06/27/2016   K 4.6 06/27/2016   CL 101 06/27/2016   CREATININE 0.89 06/27/2016   BUN 9 06/27/2016   CO2 23 06/27/2016   TSH 0.82 06/12/2016   PSA 3.75 06/12/2016   INR 2.0 09/18/2016   HGBA1C 5.9 06/14/2015       Assessment & Plan:

## 2016-09-18 NOTE — Patient Instructions (Signed)
Please take all new medication as prescribed - the pain medication once per day as needed  Please continue all other medications as before, and refills have been done if requested.  Please have the pharmacy call with any other refills you may need.  Please keep your appointments with your specialists as you may have planned  You will be contacted regarding the referral for: Neurology

## 2016-09-19 NOTE — Progress Notes (Signed)
Remote pacemaker check. No charge. Research.

## 2016-09-20 NOTE — Assessment & Plan Note (Signed)
For pain control asd,  to f/u any worsening symptoms or concerns 

## 2016-09-20 NOTE — Assessment & Plan Note (Addendum)
To walk with cane, likely related to at least an element of LBP,  For neurology referral r/o other per pt reqeust, to f/u any worsening symptoms or concerns

## 2016-09-22 ENCOUNTER — Encounter: Payer: Self-pay | Admitting: Neurology

## 2016-09-25 LAB — CUP PACEART REMOTE DEVICE CHECK
Brady Statistic RV Percent Paced: 99.9 %
Implantable Lead Location: 753860
Implantable Pulse Generator Implant Date: 20180430
Lead Channel Impedance Value: 665 Ohm
Lead Channel Pacing Threshold Pulse Width: 0.4 ms
Lead Channel Setting Pacing Amplitude: 2.5 V
Lead Channel Setting Sensing Sensitivity: 0.9 mV
MDC IDC LEAD IMPLANT DT: 20180430
MDC IDC MSMT LEADCHNL RV PACING THRESHOLD AMPLITUDE: 1 V
MDC IDC MSMT LEADCHNL RV SENSING INTR AMPL: 7.3 mV
MDC IDC SESS DTM: 20180823151415
MDC IDC SET LEADCHNL RV PACING PULSEWIDTH: 0.4 ms

## 2016-10-03 ENCOUNTER — Encounter: Payer: Self-pay | Admitting: Cardiology

## 2016-10-10 ENCOUNTER — Telehealth: Payer: Self-pay | Admitting: Internal Medicine

## 2016-10-10 DIAGNOSIS — R2689 Other abnormalities of gait and mobility: Secondary | ICD-10-CM

## 2016-10-10 DIAGNOSIS — H9203 Otalgia, bilateral: Secondary | ICD-10-CM

## 2016-10-10 NOTE — Telephone Encounter (Signed)
Patient requesting referral to [facility] ENT based off of last OV.

## 2016-10-11 NOTE — Telephone Encounter (Signed)
Can he be specific why, as I am not sure based on the last visit for low back pain.

## 2016-10-13 LAB — CUP PACEART REMOTE DEVICE CHECK
Date Time Interrogation Session: 20180910124105
Implantable Lead Implant Date: 20180430
Implantable Lead Model: 5076
Implantable Pulse Generator Implant Date: 20180430
Lead Channel Impedance Value: 608 Ohm
Lead Channel Pacing Threshold Amplitude: 0.875 V
Lead Channel Pacing Threshold Pulse Width: 0.4 ms
Lead Channel Sensing Intrinsic Amplitude: 7.3 mV
Lead Channel Setting Pacing Amplitude: 2.5 V
MDC IDC LEAD LOCATION: 753860
MDC IDC MSMT BATTERY REMAINING LONGEVITY: 153 mo
MDC IDC SET LEADCHNL RV PACING PULSEWIDTH: 0.4 ms
MDC IDC SET LEADCHNL RV SENSING SENSITIVITY: 0.9 mV
MDC IDC STAT BRADY RV PERCENT PACED: 99.9 %

## 2016-10-13 NOTE — Telephone Encounter (Signed)
Called pt LVM to follow up about referral.

## 2016-10-16 ENCOUNTER — Ambulatory Visit (INDEPENDENT_AMBULATORY_CARE_PROVIDER_SITE_OTHER): Payer: Medicare Other | Admitting: Pharmacist

## 2016-10-16 DIAGNOSIS — Z5181 Encounter for therapeutic drug level monitoring: Secondary | ICD-10-CM

## 2016-10-16 DIAGNOSIS — I4891 Unspecified atrial fibrillation: Secondary | ICD-10-CM | POA: Diagnosis not present

## 2016-10-16 LAB — POCT INR: INR: 1.9

## 2016-10-23 NOTE — Telephone Encounter (Signed)
Pt wife called back apologized for the delay they were out of town, they state at his last OV he told Javarian of some balance issues, he believes it may be coming from an inner ear issue. Wife stated his balance has gotten worse since his last appointment with Jenny Reichmann and they would liek to get it looked at. Please advise

## 2016-10-24 ENCOUNTER — Telehealth: Payer: Self-pay | Admitting: Internal Medicine

## 2016-10-24 NOTE — Telephone Encounter (Signed)
Pt has been informed and expressed understanding.  

## 2016-10-24 NOTE — Telephone Encounter (Signed)
Ok for neurology referral - done 

## 2016-10-24 NOTE — Telephone Encounter (Signed)
Would you put in a referral to the ENT? Please advise.

## 2016-10-24 NOTE — Addendum Note (Signed)
Addended by: Biagio Borg on: 10/24/2016 02:53 PM   Modules accepted: Orders

## 2016-10-24 NOTE — Telephone Encounter (Signed)
Ok this is done 

## 2016-10-24 NOTE — Telephone Encounter (Signed)
Pt's daughter has been informed and expressed understanding.  

## 2016-10-24 NOTE — Telephone Encounter (Signed)
Pt wife called back and stated she wanted the information given to her her husband did not understand very well. I informed her of the neurology referral, and she stated the patient already has a referral and has an appointment in November, JJ put this referral in in August, but its the patients ears that are giving him trouble and they would like the referral for an ENT. Please advise and the wife asked you call her back on her cell she states the patient does not understand well on the phone.  [phone]

## 2016-10-24 NOTE — Telephone Encounter (Signed)
Pt called in and needs refill on HYDROcodone-acetaminophen (NORCO) 10-325 MG tablet [[id number]]

## 2016-10-24 NOTE — Addendum Note (Signed)
Addended by: Biagio Borg on: 10/24/2016 01:12 PM   Modules accepted: Orders

## 2016-10-27 NOTE — Telephone Encounter (Signed)
Check [place] registry last filled 09/18/2016.../lmb  

## 2016-10-28 MED ORDER — HYDROCODONE-ACETAMINOPHEN 10-325 MG PO TABS: 1.0000 | ORAL_TABLET | Freq: Every day | ORAL | 0 refills | Status: DC | PRN

## 2016-10-28 NOTE — Telephone Encounter (Signed)
Done hardcopy to [place]  

## 2016-10-28 NOTE — Telephone Encounter (Signed)
Pt's wife has been informed Script at front desk

## 2016-11-11 DIAGNOSIS — R2689 Other abnormalities of gait and mobility: Secondary | ICD-10-CM | POA: Insufficient documentation

## 2016-11-11 DIAGNOSIS — H6123 Impacted cerumen, bilateral: Secondary | ICD-10-CM | POA: Insufficient documentation

## 2016-11-13 ENCOUNTER — Ambulatory Visit (INDEPENDENT_AMBULATORY_CARE_PROVIDER_SITE_OTHER): Payer: Medicare Other

## 2016-11-13 DIAGNOSIS — Z5181 Encounter for therapeutic drug level monitoring: Secondary | ICD-10-CM

## 2016-11-13 DIAGNOSIS — I4891 Unspecified atrial fibrillation: Secondary | ICD-10-CM

## 2016-11-13 LAB — POCT INR: INR: 1.5

## 2016-11-18 ENCOUNTER — Ambulatory Visit: Payer: Medicare Other | Admitting: Internal Medicine

## 2016-11-20 ENCOUNTER — Ambulatory Visit (INDEPENDENT_AMBULATORY_CARE_PROVIDER_SITE_OTHER): Payer: Self-pay | Admitting: *Deleted

## 2016-11-20 ENCOUNTER — Telehealth: Payer: Self-pay | Admitting: Cardiology

## 2016-11-20 DIAGNOSIS — I4891 Unspecified atrial fibrillation: Secondary | ICD-10-CM

## 2016-11-20 DIAGNOSIS — R001 Bradycardia, unspecified: Secondary | ICD-10-CM

## 2016-11-20 NOTE — Telephone Encounter (Signed)
Spoke with pt and reminded pt of remote transmission that is due today. Pt verbalized understanding.   

## 2016-11-20 NOTE — Progress Notes (Signed)
Remote pacemaker transmission.   

## 2016-11-25 ENCOUNTER — Ambulatory Visit (INDEPENDENT_AMBULATORY_CARE_PROVIDER_SITE_OTHER): Payer: Medicare Other | Admitting: General Practice

## 2016-11-25 DIAGNOSIS — Z23 Encounter for immunization: Secondary | ICD-10-CM

## 2016-11-27 ENCOUNTER — Ambulatory Visit (INDEPENDENT_AMBULATORY_CARE_PROVIDER_SITE_OTHER): Payer: Medicare Other | Admitting: *Deleted

## 2016-11-27 ENCOUNTER — Encounter: Payer: Self-pay | Admitting: Cardiology

## 2016-11-27 DIAGNOSIS — Z5181 Encounter for therapeutic drug level monitoring: Secondary | ICD-10-CM

## 2016-11-27 DIAGNOSIS — I4891 Unspecified atrial fibrillation: Secondary | ICD-10-CM

## 2016-11-27 LAB — POCT INR: INR: 1.9

## 2016-12-02 ENCOUNTER — Telehealth: Payer: Self-pay | Admitting: Internal Medicine

## 2016-12-02 ENCOUNTER — Ambulatory Visit: Payer: Medicare Other | Admitting: Internal Medicine

## 2016-12-02 NOTE — Telephone Encounter (Signed)
Pt is needing a refill on HYDROcodone-acetaminophen (NORCO) 10-325 MG tablet.

## 2016-12-03 MED ORDER — HYDROCODONE-ACETAMINOPHEN 10-325 MG PO TABS: 1.0000 | ORAL_TABLET | Freq: Every day | ORAL | 0 refills | Status: DC | PRN

## 2016-12-03 NOTE — Telephone Encounter (Signed)
Informed pt via VM Script at front desk

## 2016-12-03 NOTE — Telephone Encounter (Signed)
Done hardcopy to [place]  

## 2016-12-08 ENCOUNTER — Ambulatory Visit (INDEPENDENT_AMBULATORY_CARE_PROVIDER_SITE_OTHER): Payer: Medicare Other | Admitting: Neurology

## 2016-12-08 ENCOUNTER — Encounter: Payer: Self-pay | Admitting: Neurology

## 2016-12-08 VITALS — BP 126/70 | HR 64 | Ht 70.0 in | Wt 176.8 lb

## 2016-12-08 DIAGNOSIS — R2681 Unsteadiness on feet: Secondary | ICD-10-CM

## 2016-12-08 NOTE — Patient Instructions (Signed)
1.  I will refer you to physical therapy 2.  Follow up in 3 months.

## 2016-12-08 NOTE — Progress Notes (Signed)
NEUROLOGY CONSULTATION NOTE  Jonathan Orozco MRN: [id number] DOB: 09-13-1928  Referring provider: Dr. Jenny Reichmann Primary care provider: Dr. Jenny Reichmann  Reason for consult:  Unsteady gait  HISTORY OF PRESENT ILLNESS: Jonathan Orozco is an 83 year old right-handed male with paroxysmal atrial fibrillation status post pacemaker, CAD status post CABG, hypertension, hyperlipidemia, osteoarthrosis and history of melanoma and alcohol abuse in remission who presents for gait disorder.  He is accompanied by his wife who supplements history.  Remote lumbar MRIs personally reviewed.  He has history of chronic low back pain going back over 10 years.  MRI of lumbar spine from 12/07/05 demonstrated multilevel degenerative disc and facet disease.  Repeat MRI of lumbar spine from 06/30/09 showed some progression of his degenerative disc disease, with severe left lateral recess stenosis and moderate spinal stenosis at L3-L4, moderate left lateral recess stenosis at L4-L5 and moderate bilateral foraminal stenosis at L5-S1.    Over the past year, he has felt more unsteady on his feet.  It occurs with quick movements where he is maneuvering, such as turning around quickly or with his eyes closed.  He denies lightheadedness, vertigo, numbness and tingling in feet, or lower extremity weakness.  He just loses balance.  He has had falls but has caught himself.  He denies neck pain.  PAST MEDICAL HISTORY: Past Medical History:  Diagnosis Date  . ABUSE, ALCOHOL, IN REMISSION 04/08/2007  . ALLERGIC RHINITIS 09/29/2006  . Arthritis    "hands" (07/02/2016)  . Atrial fibrillation ([place])   . Atrial fibrillation with RVR ([place]) 07/02/2016  . Atrial flutter ([place]) 03/28/2010   a. s/p prior rfca;  b. recurrent paroxysmal flutter 04/2012;  c. pradaxa initiated 04/2012.  [name] BENIGN PROSTATIC HYPERTROPHY 09/29/2006  . BPPV (benign paroxysmal positional vertigo) 04/08/2007  . CAD (coronary artery disease)    a. LHC 2/17: EF 55-65%, LM 75, pLAD 75, oD1  75, oD2 65, D3 85, oLCx 99, oOM1 75, pRCA 25 >> CABG  . Carotid stenosis    a. Carotid US 2/17:  Bilateral ICA 1-39% ICA  . Chronic lower back pain   . COLONIC POLYPS, HX OF 06/23/2007  . 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 Diverticulosis   . ERECTILE DYSFUNCTION 09/29/2006  . GERD (gastroesophageal reflux disease)   . GLUCOSE INTOLERANCE 03/19/2010  . HYPERLIPIDEMIA 09/29/2006  . HYPERTENSION 09/29/2006  . Long term (current) use of anticoagulants 04/26/2010  . MELANOMA, MALIGNANT, SKIN NOS 09/29/2006   other skin cancers -no further melanoma  . OSTEOARTHROSIS NOS, OTHER [facility] SITE 09/29/2006  . Other specified forms of hearing loss 08/10/2009  . Pneumonia ~ 1969  . Presence of permanent cardiac pacemaker 06/02/2016  . VENTRICULAR TACHYCARDIA 09/29/2006    PAST SURGICAL HISTORY: Past Surgical History:  Procedure Laterality Date  . AV NODE ABLATION  07/02/2016  . CATARACT EXTRACTION W/ INTRAOCULAR LENS  IMPLANT, BILATERAL Bilateral   . INSERT / REPLACE / REMOVE PACEMAKER  06/02/2016  . JOINT REPLACEMENT    . KNEE ARTHROSCOPY Left   . MELANOMA EXCISION     "upper back"  . SHOULDER OPEN ROTATOR CUFF REPAIR Left   . SKIN CANCER EXCISION Right    "cheek; real deep"  . TONSILLECTOMY    . TOTAL KNEE ARTHROPLASTY Right     MEDICATIONS: Current Outpatient Medications on File Prior to Visit  Medication Sig Dispense Refill  . atorvastatin (LIPITOR) 10 MG tablet Take 1 tablet (10 mg total) by mouth daily. 90 tablet 3  .  doxylamine, Sleep, (UNISOM) 25 MG tablet Take 25 mg by mouth at bedtime.    . fish oil-omega-3 fatty acids 1000 MG capsule Take 1 g by mouth every evening.     [name] HYDROcodone-acetaminophen (NORCO) 10-325 MG tablet Take 1 tablet by mouth daily as needed. 30 tablet 0  . Multiple Vitamin (MULTIVITAMIN) tablet Take 1 tablet by mouth daily.      . Psyllium (METAMUCIL PO) 1 TABLESPOON IN A GLASS OF WATER ONCE DAILY    . warfarin (COUMADIN) 5 MG tablet Take as directed by  Coumadin clinic. 100 tablet 0  . ZINC OXIDE EX Apply 1 application topically as needed (itching).     No current facility-administered medications on file prior to visit.     ALLERGIES: Allergies  Allergen Reactions  . Amiodarone Hcl Other (See Comments)    Patient reports photosensitivity  . Ace Inhibitors Cough    FAMILY HISTORY: Family History  Problem Relation Age of Onset  . Diabetes Brother   . Esophageal cancer Father   . Colon cancer Neg Hx   . Stomach cancer Neg Hx     SOCIAL HISTORY: Social History   Socioeconomic History  . Marital status: Married    Spouse name: Not on file  . Number of children: Not on file  . Years of education: Not on file  . Highest education level: Not on file  Social Needs  . Financial resource strain: Not on file  . Food insecurity - worry: Not on file  . Food insecurity - inability: Not on file  . Transportation needs - medical: Not on file  . Transportation needs - non-medical: Not on file  Occupational History  . Occupation: Retired, Consulting civil engineer  . Smoking status: Former Smoker    Packs/day: 1.00    Years: 52.00    Pack years: 52.00    Types: Cigarettes  . Smokeless tobacco: Never Used  . Tobacco comment: 07/02/2016 "quit before 2000"  Substance and Sexual Activity  . Alcohol use: No    Frequency: Never    Comment: 07/02/2016 "quit before 2000"  . Drug use: No  . Sexual activity: No  Other Topics Concern  . Not on file  Social History Narrative   Married, 3 children. Retired Press photographer. Lives in [place] with  wife and kids. He is retired from Press photographer.     REVIEW OF SYSTEMS: Constitutional: No fevers, chills, or sweats, no generalized fatigue, change in appetite Eyes: No visual changes, double vision, eye pain Ear, nose and throat: No hearing loss, ear pain, nasal congestion, sore throat Cardiovascular: No chest pain, palpitations Respiratory:  No shortness of breath at rest or with exertion, wheezes GastrointestinaI:  No nausea, vomiting, diarrhea, abdominal pain, fecal incontinence Genitourinary:  No dysuria, urinary retention or frequency Musculoskeletal:  No neck pain, back pain Integumentary: No rash, pruritus, skin lesions Neurological: as above Psychiatric: No depression, insomnia, anxiety Endocrine: No palpitations, fatigue, diaphoresis, mood swings, change in appetite, change in weight, increased thirst Hematologic/Lymphatic:  No purpura, petechiae. Allergic/Immunologic: no itchy/runny eyes, nasal congestion, recent allergic reactions, rashes  PHYSICAL EXAM: Vitals:   12/08/16 1518  BP: 126/70  Pulse: 64  SpO2: 93%   General: No acute distress.  Patient appears well-groomed.  Head:  Normocephalic/atraumatic Eyes:  fundi examined but not visualized Neck: supple, no paraspinal tenderness, full range of motion Back: No paraspinal tenderness Heart: regular rate and rhythm Lungs: Clear to auscultation bilaterally. Vascular: No carotid bruits. Neurological Exam: Mental status: alert  and oriented to person, place, and time, recent and remote memory intact, fund of knowledge intact, attention and concentration intact, speech fluent and not dysarthric, language intact. Cranial nerves: CN I: not tested CN II: pupils equal, round and reactive to light, visual fields intact CN III, IV, VI:  full range of motion, no nystagmus, no ptosis CN V: facial sensation intact CN VII: upper and lower face symmetric CN VIII: hearing intact CN IX, X: gag intact, uvula midline CN XI: sternocleidomastoid and trapezius muscles intact CN XII: tongue midline Bulk & Tone: normal, no fasciculations. Motor:  5/5 throughout  Sensation:  Pinprick sensation intact and vibration sensation reduced in toes. Deep Tendon Reflexes:  absent throughout, toes downgoing.  Finger to nose testing:  Without dysmetria.  Heel to shin:  Without dysmetria.  Gait:  Normal stride but sometimes mildly unsteady.  Able to turn but  nottandem walk. Romberg negative.  IMPRESSION: Unsteady gait.  He does not exhibit any abnormal neurologic exam findings to suggest intracranial abnormality or cervical myelopathy (such as stenosis).  PLAN: 1.  We will refer him to PT 2.  Follow up in 3 months.  If gait not improved, would consider imaging.  Thank you for allowing me to take part in the care of this patient.  Metta Clines, DO  CC:  [name], MD

## 2016-12-11 ENCOUNTER — Ambulatory Visit (INDEPENDENT_AMBULATORY_CARE_PROVIDER_SITE_OTHER): Payer: Medicare Other | Admitting: *Deleted

## 2016-12-11 DIAGNOSIS — Z5181 Encounter for therapeutic drug level monitoring: Secondary | ICD-10-CM

## 2016-12-11 DIAGNOSIS — I4891 Unspecified atrial fibrillation: Secondary | ICD-10-CM

## 2016-12-11 LAB — POCT INR: INR: 2.5

## 2016-12-12 LAB — CUP PACEART REMOTE DEVICE CHECK
Battery Voltage: 3.11 V
Brady Statistic RV Percent Paced: 99.9 %
Date Time Interrogation Session: 20181018183616
Implantable Lead Implant Date: 20180430
Lead Channel Impedance Value: 399 Ohm
Lead Channel Pacing Threshold Pulse Width: 0.4 ms
Lead Channel Sensing Intrinsic Amplitude: 6.875 mV
Lead Channel Setting Pacing Amplitude: 2.5 V
Lead Channel Setting Pacing Pulse Width: 0.4 ms
MDC IDC LEAD LOCATION: 753860
MDC IDC MSMT BATTERY REMAINING LONGEVITY: 148 mo
MDC IDC MSMT LEADCHNL RV IMPEDANCE VALUE: 608 Ohm
MDC IDC MSMT LEADCHNL RV PACING THRESHOLD AMPLITUDE: 0.875 V
MDC IDC MSMT LEADCHNL RV SENSING INTR AMPL: 7.25 mV
MDC IDC PG IMPLANT DT: 20180430
MDC IDC SET LEADCHNL RV SENSING SENSITIVITY: 0.9 mV

## 2016-12-17 ENCOUNTER — Telehealth: Payer: Self-pay | Admitting: Neurology

## 2016-12-23 ENCOUNTER — Other Ambulatory Visit (INDEPENDENT_AMBULATORY_CARE_PROVIDER_SITE_OTHER): Payer: Medicare Other

## 2016-12-23 ENCOUNTER — Encounter: Payer: Self-pay | Admitting: Internal Medicine

## 2016-12-23 ENCOUNTER — Ambulatory Visit (INDEPENDENT_AMBULATORY_CARE_PROVIDER_SITE_OTHER): Payer: Medicare Other | Admitting: Internal Medicine

## 2016-12-23 VITALS — BP 116/78 | HR 89 | Temp 97.6°F | Ht 70.0 in | Wt 175.0 lb

## 2016-12-23 DIAGNOSIS — Z0000 Encounter for general adult medical examination without abnormal findings: Secondary | ICD-10-CM

## 2016-12-23 DIAGNOSIS — R739 Hyperglycemia, unspecified: Secondary | ICD-10-CM

## 2016-12-23 LAB — CBC WITH DIFFERENTIAL/PLATELET
BASOS PCT: 0.4 % (ref 0.0–3.0)
Basophils Absolute: 0 10*3/uL (ref 0.0–0.1)
Eosinophils Absolute: 0.1 10*3/uL (ref 0.0–0.7)
Eosinophils Relative: 1 % (ref 0.0–5.0)
HEMATOCRIT: 46.3 % (ref 39.0–52.0)
Hemoglobin: 15.4 g/dL (ref 13.0–17.0)
LYMPHS PCT: 42.2 % (ref 12.0–46.0)
Lymphs Abs: 3 10*3/uL (ref 0.7–4.0)
MCHC: 33.4 g/dL (ref 30.0–36.0)
MCV: 97.4 fl (ref 78.0–100.0)
MONOS PCT: 9.1 % (ref 3.0–12.0)
Monocytes Absolute: 0.6 10*3/uL (ref 0.1–1.0)
NEUTROS ABS: 3.3 10*3/uL (ref 1.4–7.7)
Neutrophils Relative %: 47.3 % (ref 43.0–77.0)
PLATELETS: 216 10*3/uL (ref 150.0–400.0)
RBC: 4.75 Mil/uL (ref 4.22–5.81)
RDW: 13.2 % (ref 11.5–15.5)
WBC: 7 10*3/uL (ref 4.0–10.5)

## 2016-12-23 LAB — HEPATIC FUNCTION PANEL
ALT: 26 U/L (ref 0–53)
AST: 23 U/L (ref 0–37)
Albumin: 4.1 g/dL (ref 3.5–5.2)
Alkaline Phosphatase: 106 U/L (ref 39–117)
BILIRUBIN DIRECT: 0.2 mg/dL (ref 0.0–0.3)
Total Bilirubin: 1 mg/dL (ref 0.2–1.2)
Total Protein: 7 g/dL (ref 6.0–8.3)

## 2016-12-23 LAB — BASIC METABOLIC PANEL
BUN: 13 mg/dL (ref 6–23)
CALCIUM: 9.6 mg/dL (ref 8.4–10.5)
CO2: 30 meq/L (ref 19–32)
Chloride: 102 mEq/L (ref 96–112)
Creatinine, Ser: 0.8 mg/dL (ref 0.40–1.50)
GFR: 97.22 mL/min (ref >60.00)
GLUCOSE: 103 mg/dL — AB (ref 70–99)
Potassium: 4.4 mEq/L (ref 3.5–5.1)
SODIUM: 136 meq/L (ref 135–145)

## 2016-12-23 LAB — URINALYSIS, ROUTINE W REFLEX MICROSCOPIC
Bilirubin Urine: NEGATIVE
Hgb urine dipstick: NEGATIVE
Ketones, ur: NEGATIVE
LEUKOCYTES UA: NEGATIVE
NITRITE: NEGATIVE
RBC / HPF: NONE SEEN (ref 0–?)
SPECIFIC GRAVITY, URINE: 1.015 (ref 1.000–1.030)
Total Protein, Urine: NEGATIVE
URINE GLUCOSE: NEGATIVE
UROBILINOGEN UA: 0.2 (ref 0.0–1.0)
pH: 5.5 (ref 5.0–8.0)

## 2016-12-23 LAB — LIPID PANEL
CHOL/HDL RATIO: 2
Cholesterol: 128 mg/dL (ref 0–200)
HDL: 53.4 mg/dL (ref >39.00)
LDL Cholesterol: 64 mg/dL (ref 0–99)
NonHDL: 74.85
Triglycerides: 56 mg/dL (ref 0.0–149.0)
VLDL: 11.2 mg/dL (ref 0.0–40.0)

## 2016-12-23 LAB — HEMOGLOBIN A1C: HEMOGLOBIN A1C: 5.9 % (ref 4.6–6.5)

## 2016-12-23 LAB — TSH: TSH: 0.52 u[IU]/mL (ref 0.35–4.50)

## 2016-12-23 NOTE — Assessment & Plan Note (Signed)

## 2016-12-23 NOTE — Assessment & Plan Note (Signed)
stable overall by history and exam, recent data reviewed with pt, and pt to continue medical treatment as before,  to f/u any worsening symptoms or concerns Lab Results  Component Value Date   HGBA1C 5.9 06/14/2015

## 2016-12-23 NOTE — Patient Instructions (Signed)

## 2016-12-23 NOTE — Progress Notes (Signed)
Subjective:    Patient ID: Jonathan Orozco, male    DOB: [birth year], 83 y.o.   MRN: [id number]  HPI  Here to f/u; overall doing ok,  Pt denies chest pain, increasing sob or doe, wheezing, orthopnea, PND, increased LE swelling, palpitations, dizziness or syncope.  Pt denies new neurological symptoms such as new headache, or facial or extremity weakness or numbness.  Pt denies polydipsia, polyuria, or low sugar episode.  Pt states overall good compliance with meds, mostly trying to follow appropriate diet, with wt overall stable,  but little exercise however, due to gait issues.  No falls.  S/p hospn may 2018 with AV junction ablation, also with I&D of chset wall abscess.  .  Also seen per neurology nov 5 with impression of unsteady gait.  Has not yet started the PT.   C/o loose bowels alternating with constipation, despite metamucil and fiber; has not tried miralax for other.  Denies worsening reflux, abd pain, dysphagia, n/v, or blood Past Medical History:  Diagnosis Date  . ABUSE, ALCOHOL, IN REMISSION 04/08/2007  . ALLERGIC RHINITIS 09/29/2006  . Arthritis    "hands" (07/02/2016)  . Atrial fibrillation ([place])   . Atrial fibrillation with RVR ([place]) 07/02/2016  . Atrial flutter ([place]) 03/28/2010   a. s/p prior rfca;  b. recurrent paroxysmal flutter 04/2012;  c. pradaxa initiated 04/2012.  [name] BENIGN PROSTATIC HYPERTROPHY 09/29/2006  . BPPV (benign paroxysmal positional vertigo) 04/08/2007  . CAD (coronary artery disease)    a. LHC 2/17: EF 55-65%, LM 75, pLAD 75, oD1 75, oD2 65, D3 85, oLCx 99, oOM1 75, pRCA 25 >> CABG  . Carotid stenosis    a. Carotid US 2/17:  Bilateral ICA 1-39% ICA  . Chronic lower back pain   . COLONIC POLYPS, HX OF 06/23/2007  . 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 Diverticulosis   . ERECTILE DYSFUNCTION 09/29/2006  . GERD (gastroesophageal reflux disease)   . GLUCOSE INTOLERANCE 03/19/2010  . HYPERLIPIDEMIA 09/29/2006  . HYPERTENSION 09/29/2006  . Long term (current) use of  anticoagulants 04/26/2010  . MELANOMA, MALIGNANT, SKIN NOS 09/29/2006   other skin cancers -no further melanoma  . OSTEOARTHROSIS NOS, OTHER [facility] SITE 09/29/2006  . Other specified forms of hearing loss 08/10/2009  . Pneumonia ~ 1969  . Presence of permanent cardiac pacemaker 06/02/2016  . VENTRICULAR TACHYCARDIA 09/29/2006   Past Surgical History:  Procedure Laterality Date  . AV NODE ABLATION  07/02/2016  . AV Node Ablation N/A 07/02/2016   Performed by Deboraha Sprang, MD at [place] CV LAB  . CARDIOVERSION N/A 03/24/2016   Performed by [name], MD at [place]  . CARDIOVERSION N/A 12/19/2015   Performed by [name], MD at [facility] ENDOSCOPY  . CATARACT EXTRACTION W/ INTRAOCULAR LENS  IMPLANT, BILATERAL Bilateral   . CLIPPING OF ATRIAL APPENDAGE N/A 03/14/2015   Performed by [name], MD at [place] PROPOFOL N/A 12/08/2014   Performed by [name], MD at [place]  . CORONARY ARTERY BYPASS GRAFTING (CABG) x 4 (LIMA to LAD, SVG to DIAGONAL 2, SVG SEQUENTIALLY to OM1 and OM2) N/A 03/14/2015   Performed by [name], MD at [place]  . INCISION AND DRAINAGE of CHEST ABSCESS N/A 07/03/2016   Performed by [name], MD at [place]  . INSERT / REPLACE / REMOVE PACEMAKER  06/02/2016  . JOINT REPLACEMENT    . KNEE ARTHROSCOPY Left   .  Left Heart Cath and Coronary Angiography N/A 03/12/2015   Performed by [name], MD at [place] CV LAB  . MELANOMA EXCISION     "upper back"  . Pacemaker Implant N/A 06/02/2016   Performed by Deboraha Sprang, MD at [place] [facility]  . SHOULDER OPEN ROTATOR CUFF REPAIR Left   . SKIN CANCER EXCISION Right    "cheek; real deep"  . TONSILLECTOMY    . TOTAL KNEE ARTHROPLASTY Right   . TRANSESOPHAGEAL ECHOCARDIOGRAM (TEE) N/A 03/24/2016   Performed by [name], MD at [place]  . TRANSESOPHAGEAL ECHOCARDIOGRAM (TEE) N/A 12/19/2015   Performed by [name], MD at [place]  . TRANSESOPHAGEAL ECHOCARDIOGRAM (TEE) N/A 03/14/2015   Performed by [name], MD at [place]    reports that he has quit smoking. His smoking use included cigarettes. He has a 52.00 pack-year smoking history. he has never used smokeless tobacco. He reports that he does not drink alcohol or use drugs. family history includes Diabetes in his brother; Esophageal cancer in his father. Allergies  Allergen Reactions  . Amiodarone Hcl Other (See Comments)    Patient reports photosensitivity  . Ace Inhibitors Cough   Current Outpatient Medications on File Prior to Visit  Medication Sig Dispense Refill  . atorvastatin (LIPITOR) 10 MG tablet Take 1 tablet (10 mg total) by mouth daily. 90 tablet 3  . doxylamine, Sleep, (UNISOM) 25 MG tablet Take 25 mg by mouth at bedtime.    . fish oil-omega-3 fatty acids 1000 MG capsule Take 1 g by mouth every evening.     [name] HYDROcodone-acetaminophen (NORCO) 10-325 MG tablet Take 1 tablet by mouth daily as needed. 30 tablet 0  . Multiple Vitamin (MULTIVITAMIN) tablet Take 1 tablet by mouth daily.      . Psyllium (METAMUCIL PO) 1 TABLESPOON IN A GLASS OF WATER ONCE DAILY    . tamsulosin (FLOMAX) 0.4 MG CAPS capsule Take 0.4 mg daily by mouth.    . warfarin (COUMADIN) 5 MG tablet Take as directed by Coumadin clinic. 100 tablet 0  . ZINC OXIDE EX Apply 1 application topically as needed (itching).     No current facility-administered medications on file prior to visit.    Review of Systems Constitutional: Negative for other unusual diaphoresis, sweats, appetite or weight changes HENT: Negative for other worsening hearing loss, ear pain, facial swelling, mouth sores or neck stiffness.   Eyes: Negative for other worsening pain, redness or other visual disturbance.  Respiratory: Negative for other stridor or swelling Cardiovascular: Negative for other palpitations or other chest pain  Gastrointestinal: Negative for worsening diarrhea or loose  stools, blood in stool, distention or other pain Genitourinary: Negative for hematuria, flank pain or other change in urine volume.  Musculoskeletal: Negative for myalgias or other joint swelling.  Skin: Negative for other color change, or other wound or worsening drainage.  Neurological: Negative for other syncope or numbness. Hematological: Negative for other adenopathy or swelling Psychiatric/Behavioral: Negative for hallucinations, other worsening agitation, SI, self-injury, or new decreased concentration All other system neg per pt    Objective:   Physical Exam BP 116/78   Pulse 89   Temp 97.6 F (36.4 C) (Oral)   Ht 5\' 10"  (1.778 m)   Wt 175 lb (79.4 kg)   SpO2 99%   BMI 25.11 kg/m  VS noted,  Constitutional: Pt is oriented to person, place, and time. Appears well-developed and well-nourished, in no significant  distress and comfortable Head: Normocephalic and atraumatic  Eyes: Conjunctivae and EOM are normal. Pupils are equal, round, and reactive to light Right Ear: External ear normal without discharge Left Ear: External ear normal without discharge Nose: Nose without discharge or deformity Mouth/Throat: Oropharynx is without other ulcerations and moist  Neck: Normal range of motion. Neck supple. No JVD present. No tracheal deviation present or significant neck LA or mass Cardiovascular: Normal rate, regular rhythm, normal heart sounds and intact distal pulses.   Pulmonary/Chest: WOB normal and breath sounds without rales or wheezing  Abdominal: Soft. Bowel sounds are normal. NT. No HSM  Musculoskeletal: Normal range of motion. Exhibits no edema Lymphadenopathy: Has no other cervical adenopathy.  Neurological: Pt is alert and oriented to person, place but appears to be somewhat slowed cognition, may have some very early ST memory dysfxn,   Pt has normal reflexes. No cranial nerve deficit. Motor grossly intact, Gait intact Skin: Skin is warm and dry. No rash noted or new  ulcerations Psychiatric:  Has normal mood and affect. Behavior is normal without agitation No other exam findings  Lab Results  Component Value Date   WBC 5.9 06/27/2016   HGB 13.8 06/27/2016   HCT 41.1 06/27/2016   PLT 254 06/27/2016   GLUCOSE 129 (H) 06/27/2016   CHOL 111 06/12/2016   TRIG 59.0 06/12/2016   HDL 47.90 06/12/2016   LDLDIRECT 159.2 09/29/2006   LDLCALC 51 06/12/2016   ALT 23 03/11/2016   AST 26 03/11/2016   NA 139 06/27/2016   K 4.6 06/27/2016   CL 101 06/27/2016   CREATININE 0.89 06/27/2016   BUN 9 06/27/2016   CO2 23 06/27/2016   TSH 0.82 06/12/2016   PSA 3.75 06/12/2016   INR 2.5 12/11/2016   HGBA1C 5.9 06/14/2015      Assessment & Plan:

## 2016-12-31 ENCOUNTER — Ambulatory Visit: Payer: Medicare Other | Attending: Neurology

## 2016-12-31 DIAGNOSIS — R2689 Other abnormalities of gait and mobility: Secondary | ICD-10-CM

## 2016-12-31 DIAGNOSIS — M6281 Muscle weakness (generalized): Secondary | ICD-10-CM | POA: Diagnosis present

## 2016-12-31 DIAGNOSIS — R208 Other disturbances of skin sensation: Secondary | ICD-10-CM | POA: Insufficient documentation

## 2016-12-31 NOTE — Therapy (Addendum)
[facility] [address] [place], [place], [address] Phone: [phone]   Fax:  [phone]  Physical Therapy Evaluation  Patient Details  Name: Jonathan Orozco MRN: [id number] Date of Birth: [birth year] Referring Provider: Dr. Tomi Likens   Encounter Date: 12/31/2016  PT End of Session - 12/31/16 1611    Visit Number  1    Number of Visits  9    Date for PT Re-Evaluation  01/30/17    Authorization Type  [employer] Medicare: G-CODE AND PN EVERY 10TH VISIT.     PT Start Time  1101    PT Stop Time  1146    PT Time Calculation (min)  45 min    Equipment Utilized During Treatment  -- min guard to S    Activity Tolerance  Patient tolerated treatment well    Behavior During Therapy  [facility] for tasks assessed/performed       Past Medical History:  Diagnosis Date  . ABUSE, ALCOHOL, IN REMISSION 04/08/2007  . ALLERGIC RHINITIS 09/29/2006  . Arthritis    "hands" (07/02/2016)  . Atrial fibrillation ([place])   . Atrial fibrillation with RVR ([place]) 07/02/2016  . Atrial flutter ([place]) 03/28/2010   a. s/p prior rfca;  b. recurrent paroxysmal flutter 04/2012;  c. pradaxa initiated 04/2012.  [name] BENIGN PROSTATIC HYPERTROPHY 09/29/2006  . BPPV (benign paroxysmal positional vertigo) 04/08/2007  . CAD (coronary artery disease)    a. LHC 2/17: EF 55-65%, LM 75, pLAD 75, oD1 75, oD2 65, D3 85, oLCx 99, oOM1 75, pRCA 25 >> CABG  . Carotid stenosis    a. Carotid US 2/17:  Bilateral ICA 1-39% ICA  . Chronic lower back pain   . COLONIC POLYPS, HX OF 06/23/2007  . 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 Diverticulosis   . ERECTILE DYSFUNCTION 09/29/2006  . GERD (gastroesophageal reflux disease)   . GLUCOSE INTOLERANCE 03/19/2010  . HYPERLIPIDEMIA 09/29/2006  . HYPERTENSION 09/29/2006  . Long term (current) use of anticoagulants 04/26/2010  . MELANOMA, MALIGNANT, SKIN NOS 09/29/2006   other skin cancers -no further melanoma  . OSTEOARTHROSIS NOS, OTHER [facility] SITE  09/29/2006  . Other specified forms of hearing loss 08/10/2009  . Pneumonia ~ 1969  . Presence of permanent cardiac pacemaker 06/02/2016  . VENTRICULAR TACHYCARDIA 09/29/2006    Past Surgical History:  Procedure Laterality Date  . AV NODE ABLATION  07/02/2016  . AV NODE ABLATION N/A 07/02/2016   Procedure: AV Node Ablation;  Surgeon: Deboraha Sprang, MD;  Location: [place] [facility];  Service: Cardiovascular;  Laterality: N/A;  . CARDIAC CATHETERIZATION N/A 03/12/2015   Procedure: Left Heart Cath and Coronary Angiography;  Surgeon: [name], MD;  Location: [place] [facility];  Service: Cardiovascular;  Laterality: N/A;  . CARDIOVERSION N/A 12/19/2015   Procedure: CARDIOVERSION;  Surgeon: [name], MD;  Location[facility];  Service: Cardiovascular;  Laterality: N/A;  . CARDIOVERSION N/A 03/24/2016   Procedure: CARDIOVERSION;  Surgeon: [name], MD;  Location: [place];  Service: Cardiovascular;  Laterality: N/A;  . CATARACT EXTRACTION W/ INTRAOCULAR LENS  IMPLANT, BILATERAL Bilateral   . CLIPPING OF ATRIAL APPENDAGE N/A 03/14/2015   Procedure: CLIPPING OF ATRIAL APPENDAGE;  Surgeon: [name], MD;  Location: [place];  Service: Open Heart Surgery;  Laterality: N/A;  . COLONOSCOPY WITH PROPOFOL N/A 12/08/2014   Procedure: COLONOSCOPY WITH PROPOFOL;  Surgeon: [name], MD;  Location: [facility];  Service: Endoscopy;  Laterality: N/A;  .  CORONARY ARTERY BYPASS GRAFT N/A 03/14/2015   Procedure: CORONARY ARTERY BYPASS GRAFTING (CABG) x 4 (LIMA to LAD, SVG to DIAGONAL 2, SVG SEQUENTIALLY to OM1 and OM2);  Surgeon: [name], MD;  Location: [facility];  Service: Open Heart Surgery;  Laterality: N/A;  . INCISION AND DRAINAGE N/A 07/03/2016   Procedure: INCISION AND DRAINAGE of CHEST ABSCESS;  Surgeon: [name], MD;  Location: [facility];  Service: Thoracic;  Laterality: N/A;  . INSERT / REPLACE / REMOVE PACEMAKER  06/02/2016  . JOINT REPLACEMENT    . KNEE  ARTHROSCOPY Left   . MELANOMA EXCISION     "upper back"  . PACEMAKER IMPLANT N/A 06/02/2016   Procedure: Pacemaker Implant;  Surgeon: Deboraha Sprang, MD;  Location: [place] [facility];  Service: Cardiovascular;  Laterality: N/A;  . SHOULDER OPEN ROTATOR CUFF REPAIR Left   . SKIN CANCER EXCISION Right    "cheek; real deep"  . TEE WITHOUT CARDIOVERSION N/A 03/14/2015   Procedure: TRANSESOPHAGEAL ECHOCARDIOGRAM (TEE);  Surgeon: [name], MD;  Location: [place];  Service: Open Heart Surgery;  Laterality: N/A;  . TEE WITHOUT CARDIOVERSION N/A 12/19/2015   Procedure: TRANSESOPHAGEAL ECHOCARDIOGRAM (TEE);  Surgeon: [name], MD;  Location: [facility];  Service: Cardiovascular;  Laterality: N/A;  . TEE WITHOUT CARDIOVERSION N/A 03/24/2016   Procedure: TRANSESOPHAGEAL ECHOCARDIOGRAM (TEE);  Surgeon: [name], MD;  Location: [facility];  Service: Cardiovascular;  Laterality: N/A;  . TONSILLECTOMY    . TOTAL KNEE ARTHROPLASTY Right     There were no vitals filed for this visit.   Subjective Assessment - 12/31/16 1110    Subjective  Pt reports balance issues began after 2017 CABG surgery and are worse when he's stepping in/out of shower, turning, walking slowly to negotiate obstacles. Pt walks 3x/week, 2 miles (30 minutes), for exercise and can feel unsteady at times. Pt denied falls in last 6 months. Pt reports neurologist cleared him for any neurological condition. At end of session pt reported he would like more energy, as he feels SOB when ambulating longer distances.     Patient is accompained by:  Family member [name]: wife    Pertinent History  A-fib, CAD, hearing loss, pacemaker, HLD, hx of vertigo, chronic LBP, arthritis, hx of skin CA    Patient Stated Goals  I want to get to be balanced.     Currently in Pain?  No/denies         [facility] PT Assessment - 12/31/16 1115      Assessment   Medical Diagnosis  Unsteady gait    Referring Provider  Dr. Tomi Likens    Onset  Date/Surgical Date  01/01/15 s/p CABG (inactive)    Hand Dominance  Right    Prior Therapy  None for unsteadiness      Precautions   Precautions  ICD/Pacemaker;Fall      Restrictions   Weight Bearing Restrictions  No      Balance Screen   Has the patient fallen in the past 6 months  No    Has the patient had a decrease in activity level because of a fear of falling?   No    Is the patient reluctant to leave their home because of a fear of falling?   No      Home Environment   Living Environment  Private residence    Living Arrangements  Spouse/significant other    Available Help at Discharge  Family  Type of [place] to enter    Entrance Stairs-Number of Steps  3    Entrance Stairs-Rails  Left    Home Layout  Able to live on main level with bedroom/bathroom;Multi-level basement    Alternate Level Stairs-Number of Steps  15 and steep    Alternate Level Stairs-Rails  Right going upstairs and in basement    Home Equipment  Walker - 2 wheels;Grab bars - tub/shower;Cane - single point;Shower seat      Prior Function   Level of Independence  Independent    Vocation  Retired    Leisure  Walk, travel      Cognition   Overall Cognitive Status  Within Functional Limits for tasks assessed pt recalls decr. short-term memory and word finding      Sensation   Light Touch  Impaired by gross assessment    Additional Comments  Pt reported decr. sensation in R lower LE/foot.      Coordination   Gross Motor Movements are Fluid and Coordinated  Yes    Fine Motor Movements are Fluid and Coordinated  Yes      Posture/Postural Control   Posture/Postural Control  Postural limitations    Postural Limitations  Forward head;Rounded Shoulders      ROM / Strength   AROM / PROM / Strength  AROM;Strength      AROM   Overall AROM   Within functional limits for tasks performed    Overall AROM Comments  BUE/LE AROM WNL      Strength   Overall Strength  Deficits     Overall Strength Comments  BLE strength WFL: 4+/5, except for B hamstring: 3+/5. B hip ext not tested but weakness expected 2/2 gait deviations.       Transfers   Transfers  Sit to Stand;Stand to Sit    Sit to Stand  5: Supervision;With upper extremity assist;From chair/3-in-1    Stand to Sit  5: Supervision;With upper extremity assist;To elevated surface    Transfer Cueing  S as pt reports intermittent unsteadiness during STS txfs.       Ambulation/Gait   Ambulation/Gait  Yes    Ambulation/Gait Assistance  5: Supervision;4: Min guard    Ambulation/Gait Assistance Details  Min guard during 1/2 turn to ensure safety. Intermittent veering from path 2/2 unsteady but pt denied dizziness.    Ambulation Distance (Feet)  300 Feet    Assistive device  None    Gait Pattern  Step-through pattern;Narrow base of support intermittent narrow BOS during incr. postural sway    Ambulation Surface  Level;Indoor    Gait velocity  3.75ft/sec.      Functional Gait  Assessment   Gait assessed   Yes    Gait Level Surface  Walks 20 ft in less than 5.5 sec, no assistive devices, good speed, no evidence for imbalance, normal gait pattern, deviates no more than 6 in outside of the 12 in walkway width. 5.3sec.    Change in Gait Speed  Able to smoothly change walking speed without loss of balance or gait deviation. Deviate no more than 6 in outside of the 12 in walkway width.    Gait with Horizontal Head Turns  Performs head turns smoothly with slight change in gait velocity (eg, minor disruption to smooth gait path), deviates 6-10 in outside 12 in walkway width, or uses an assistive device.    Gait with Vertical Head Turns  Performs task  with slight change in gait velocity (eg, minor disruption to smooth gait path), deviates 6 - 10 in outside 12 in walkway width or uses assistive device    Gait and Pivot Turn  Turns slowly, requires verbal cueing, or requires several small steps to catch balance following turn and stop     Step Over Obstacle  Is able to step over 2 stacked shoe boxes taped together (9 in total height) without changing gait speed. No evidence of imbalance.    Gait with Narrow Base of Support  Ambulates less than 4 steps heel to toe or cannot perform without assistance.    Gait with Eyes Closed  Cannot walk 20 ft without assistance, severe gait deviations or imbalance, deviates greater than 15 in outside 12 in walkway width or will not attempt task.    Ambulating Backwards  Walks 20 ft, uses assistive device, slower speed, mild gait deviations, deviates 6-10 in outside 12 in walkway width.    Steps  Alternating feet, no rail.    Total Score  19    FGA comment:  19/24: indicates pt is at a moderate risk falls.              Objective measurements completed on examination: See above findings.              PT Education - 12/31/16 1610    Education provided  Yes    Education Details  PT educated pt on outcome measures, PT POC, duration and frequency.    Person(s) Educated  Patient;Spouse    Methods  Explanation    Comprehension  Verbalized understanding       PT Short Term Goals - 12/31/16 1623      PT SHORT TERM GOAL #1   Title  same as LTGs        PT Long Term Goals - 12/31/16 1623      PT LONG TERM GOAL #1   Title  Pt will be IND in HEP to improve balance, strength, and flexibility. TARGET DATE FOR ALL LTGS: 01/28/17    Status  New      PT LONG TERM GOAL #2   Title  Pt will improve FGA score to >/=27/30 to decr. falls risk.     Status  New      PT LONG TERM GOAL #3   Title  Pt will amb. 1000' over even/uneven terrain while perform dynamic gait activities (head turns/nods/negotiating obstacles) IND in order to improve safety during functional mobility.     Status  New      PT LONG TERM GOAL #4   Title  Perform 6MWT and write goal as indicated.     Status  New             Plan - 12/31/16 1612    Clinical Impression Statement  Pt is a pleasant 83y/o  male presenting to OPPT neuro for unsteady gait. Pt's PMH is significant for the following: A-fib, CAD, hearing loss, pacemaker, HLD, hx of vertigo, chronic LBP, arthritis, hx of skin CA. The following deficits were found during the exam: gait deviations, impaired strength, impaired balance, postural dysfunction, impaired flexibility, impaired sensation, and decr. endurance. Pt's FGA score indicates pt is at a moderate risk for falls. Pt's gait speed was WNL but pt does experience incr. postural sway during dynamic gait activities. Pt would benefit from skilled PT to improve safety during functional mobility.     History and Personal Factors relevant to plan  of care:  Pt enjoys walking in the neighborhood and enjoys traveling with his wife but is limited 2/2 balance.     Clinical Presentation  Stable    Clinical Presentation due to:  A-fib, CAD, hearing loss, pacemaker, HLD, hx of vertigo, chronic LBP, arthritis, hx of skin CA    Clinical Decision Making  Moderate    Rehab Potential  Good    Clinical Impairments Affecting Rehab Potential  see above.    PT Frequency  2x / week    PT Duration  4 weeks    PT Treatment/Interventions  ADLs/Self Care Home Management;Biofeedback;Canalith Repostioning;Electrical Stimulation;Therapeutic exercise;Manual techniques;Vestibular;Therapeutic activities;Functional mobility training;Orthotic Fit/Training;Stair training;Gait training;Patient/family education;DME Instruction;Neuromuscular re-education;Balance training    PT Next Visit Plan  Perform 6MWT and write goal as indicated. Initiate balance, strengthening, and flexibility HEP  (maybe high level OTAGO activities)   Consulted and Agree with Plan of Care  Patient;Family member/caregiver    Family Member Consulted  pt's wife: [name]       Patient will benefit from skilled therapeutic intervention in order to improve the following deficits and impairments:  Abnormal gait, Decreased endurance, Impaired sensation,  Decreased strength, Decreased knowledge of use of DME, Decreased balance, Decreased mobility, Impaired flexibility  Visit Diagnosis: Other abnormalities of gait and mobility - Plan: PT plan of care cert/re-cert  Muscle weakness (generalized) - Plan: PT plan of care cert/re-cert  Other disturbances of skin sensation - Plan: PT plan of care cert/re-cert  G-Codes - 48/18/56 1625    Functional Assessment Tool Used (Outpatient Only)  FGA: 19/30    Functional Limitation  Mobility: Walking and moving around    Mobility: Walking and Moving Around Current Status [phone])  At least 40 percent but less than 60 percent impaired, limited or restricted    Mobility: Walking and Moving Around Goal Status [phone])  At least 1 percent but less than 20 percent impaired, limited or restricted        Problem List Patient Active Problem List   Diagnosis Date Noted  . Preventative health care 06/19/2016  . Increased prostate specific antigen (PSA) velocity 06/19/2016  . Gait disorder 01/27/2016  . Chronic anticoagulation 12/18/2015  . Hyperglycemia 06/17/2015  . CAD (coronary artery disease), native coronary artery 03/12/2015  . Atrial fibrillation with rapid ventricular response ([place])   . Encounter for therapeutic drug monitoring 06/20/2013  . Solitary pulmonary nodule 05/24/2013  . Anxiety state 05/21/2012  . Chronic low back pain 05/21/2012  . Long term (current) use of anticoagulants 04/24/2012  . Hearing loss of both ears 11/11/2010  . FATIGUE 11/16/2009  . VERTIGO 04/08/2007  . MELANOMA, MALIGNANT, SKIN NOS 09/29/2006  . Hyperlipidemia 09/29/2006  . ERECTILE DYSFUNCTION 09/29/2006  . VENTRICULAR TACHYCARDIA 09/29/2006  . BENIGN PROSTATIC HYPERTROPHY 09/29/2006  . OSTEOARTHROSIS NOS, OTHER [facility] SITE 09/29/2006    Kaisen Ackers L 12/31/2016, 4:26 PM  [facility] [address] [place], [place], [address] Phone: [phone]    Fax:  [phone]  Name: Jonathan Orozco MRN: [id number] Date of Birth: Sep 21, 1928  Geoffry Paradise, PT,DPT 12/31/16 4:27 PM Phone: [phone] Fax: [phone]

## 2017-01-01 ENCOUNTER — Ambulatory Visit (INDEPENDENT_AMBULATORY_CARE_PROVIDER_SITE_OTHER): Payer: Medicare Other | Admitting: *Deleted

## 2017-01-01 DIAGNOSIS — I4891 Unspecified atrial fibrillation: Secondary | ICD-10-CM | POA: Diagnosis not present

## 2017-01-01 DIAGNOSIS — Z5181 Encounter for therapeutic drug level monitoring: Secondary | ICD-10-CM | POA: Diagnosis not present

## 2017-01-01 LAB — POCT INR: INR: 2.4

## 2017-01-01 NOTE — Patient Instructions (Addendum)
Continue taking 1 tablet daily except 1/2 tablet on Tuesdays. Recheck in 4 weeks with MD appt.  Call us with any medication changes or concerns # [phone].

## 2017-01-08 ENCOUNTER — Ambulatory Visit: Payer: Medicare Other | Attending: Neurology

## 2017-01-08 ENCOUNTER — Ambulatory Visit: Payer: Medicare Other

## 2017-01-08 DIAGNOSIS — M6281 Muscle weakness (generalized): Secondary | ICD-10-CM | POA: Diagnosis present

## 2017-01-08 DIAGNOSIS — R208 Other disturbances of skin sensation: Secondary | ICD-10-CM | POA: Insufficient documentation

## 2017-01-08 DIAGNOSIS — R2689 Other abnormalities of gait and mobility: Secondary | ICD-10-CM | POA: Diagnosis not present

## 2017-01-08 NOTE — Therapy (Signed)
[facility] [address] [place], [place], [address] Phone: [phone]   Fax:  [phone]  Physical Therapy Treatment  Patient Details  Name: Jonathan Orozco MRN: [id number] Date of Birth: 1928-06-07 Referring Provider: Dr. Tomi Likens   Encounter Date: 01/08/2017  PT End of Session - 01/08/17 1159    Visit Number  2    Number of Visits  9    Date for PT Re-Evaluation  01/30/17    Authorization Type  [employer] Medicare: G-CODE AND PN EVERY 10TH VISIT.     PT Start Time  1030 pt arrived late    PT Stop Time  1057    PT Time Calculation (min)  27 min    Activity Tolerance  Patient tolerated treatment well;Treatment limited secondary to medical complications (Comment) incr. BP after amb.    Behavior During Therapy  [facility] for tasks assessed/performed       Past Medical History:  Diagnosis Date  . ABUSE, ALCOHOL, IN REMISSION 04/08/2007  . ALLERGIC RHINITIS 09/29/2006  . Arthritis    "hands" (07/02/2016)  . Atrial fibrillation ([place])   . Atrial fibrillation with RVR ([place]) 07/02/2016  . Atrial flutter ([place]) 03/28/2010   a. s/p prior rfca;  b. recurrent paroxysmal flutter 04/2012;  c. pradaxa initiated 04/2012.  [name] BENIGN PROSTATIC HYPERTROPHY 09/29/2006  . BPPV (benign paroxysmal positional vertigo) 04/08/2007  . CAD (coronary artery disease)    a. LHC 2/17: EF 55-65%, LM 75, pLAD 75, oD1 75, oD2 65, D3 85, oLCx 99, oOM1 75, pRCA 25 >> CABG  . Carotid stenosis    a. Carotid US 2/17:  Bilateral ICA 1-39% ICA  . Chronic lower back pain   . COLONIC POLYPS, HX OF 06/23/2007  . 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 Diverticulosis   . ERECTILE DYSFUNCTION 09/29/2006  . GERD (gastroesophageal reflux disease)   . GLUCOSE INTOLERANCE 03/19/2010  . HYPERLIPIDEMIA 09/29/2006  . HYPERTENSION 09/29/2006  . Long term (current) use of anticoagulants 04/26/2010  . MELANOMA, MALIGNANT, SKIN NOS 09/29/2006   other skin cancers -no further melanoma  .  OSTEOARTHROSIS NOS, OTHER [facility] SITE 09/29/2006  . Other specified forms of hearing loss 08/10/2009  . Pneumonia ~ 1969  . Presence of permanent cardiac pacemaker 06/02/2016  . VENTRICULAR TACHYCARDIA 09/29/2006    Past Surgical History:  Procedure Laterality Date  . AV NODE ABLATION  07/02/2016  . AV NODE ABLATION N/A 07/02/2016   Procedure: AV Node Ablation;  Surgeon: Deboraha Sprang, MD;  Location: [place] CV LAB;  Service: Cardiovascular;  Laterality: N/A;  . CARDIAC CATHETERIZATION N/A 03/12/2015   Procedure: Left Heart Cath and Coronary Angiography;  Surgeon: [name], MD;  Location: [place] CV LAB;  Service: Cardiovascular;  Laterality: N/A;  . CARDIOVERSION N/A 12/19/2015   Procedure: CARDIOVERSION;  Surgeon: [name], MD;  Location: [facility] ENDOSCOPY;  Service: Cardiovascular;  Laterality: N/A;  . CARDIOVERSION N/A 03/24/2016   Procedure: CARDIOVERSION;  Surgeon: [name], MD;  Location: [facility];  Service: Cardiovascular;  Laterality: N/A;  . CATARACT EXTRACTION W/ INTRAOCULAR LENS  IMPLANT, BILATERAL Bilateral   . CLIPPING OF ATRIAL APPENDAGE N/A 03/14/2015   Procedure: CLIPPING OF ATRIAL APPENDAGE;  Surgeon: [name], MD;  Location: [facility];  Service: Open Heart Surgery;  Laterality: N/A;  . COLONOSCOPY WITH PROPOFOL N/A 12/08/2014   Procedure: COLONOSCOPY WITH PROPOFOL;  Surgeon: [name], MD;  Location: [facility] ENDOSCOPY;  Service: Endoscopy;  Laterality: N/A;  .  CORONARY ARTERY BYPASS GRAFT N/A 03/14/2015   Procedure: CORONARY ARTERY BYPASS GRAFTING (CABG) x 4 (LIMA to LAD, SVG to DIAGONAL 2, SVG SEQUENTIALLY to OM1 and OM2);  Surgeon: [name], MD;  Location: [facility];  Service: Open Heart Surgery;  Laterality: N/A;  . INCISION AND DRAINAGE N/A 07/03/2016   Procedure: INCISION AND DRAINAGE of CHEST ABSCESS;  Surgeon: [name], MD;  Location: [facility];  Service: Thoracic;  Laterality: N/A;  . INSERT / REPLACE / REMOVE PACEMAKER   06/02/2016  . JOINT REPLACEMENT    . KNEE ARTHROSCOPY Left   . MELANOMA EXCISION     "upper back"  . PACEMAKER IMPLANT N/A 06/02/2016   Procedure: Pacemaker Implant;  Surgeon: Deboraha Sprang, MD;  Location: [place] CV LAB;  Service: Cardiovascular;  Laterality: N/A;  . SHOULDER OPEN ROTATOR CUFF REPAIR Left   . SKIN CANCER EXCISION Right    "cheek; real deep"  . TEE WITHOUT CARDIOVERSION N/A 03/14/2015   Procedure: TRANSESOPHAGEAL ECHOCARDIOGRAM (TEE);  Surgeon: [name], MD;  Location: [place];  Service: Open Heart Surgery;  Laterality: N/A;  . TEE WITHOUT CARDIOVERSION N/A 12/19/2015   Procedure: TRANSESOPHAGEAL ECHOCARDIOGRAM (TEE);  Surgeon: [name], MD;  Location: [place];  Service: Cardiovascular;  Laterality: N/A;  . TEE WITHOUT CARDIOVERSION N/A 03/24/2016   Procedure: TRANSESOPHAGEAL ECHOCARDIOGRAM (TEE);  Surgeon: [name], MD;  Location: [facility] [facility];  Service: Cardiovascular;  Laterality: N/A;  . TONSILLECTOMY    . TOTAL KNEE ARTHROPLASTY Right     There were no vitals filed for this visit.  Subjective Assessment - 01/08/17 1033    Subjective  Pt denied falls or changes since last visit. Pt reports he's feeling better than usual.     Patient is accompained by:  Family member [name]: wife    Pertinent History  A-fib, CAD, hearing loss, pacemaker, HLD, hx of vertigo, chronic LBP, arthritis, hx of skin CA    Patient Stated Goals  I want to get to be balanced.     Currently in Pain?  No/denies         [facility] PT Assessment - 01/08/17 1035      6 Minute Walk- Baseline   6 Minute Walk- Baseline  yes    BP (mmHg)  156/77    HR (bpm)  84    02 Sat (%RA)  99 %    Modified Borg Scale for Dyspnea  0- Nothing at all    Perceived Rate of Exertion (Borg)  6-      6 Minute walk- Post Test   6 Minute Walk Post Test  yes    BP (mmHg)  181/86    HR (bpm)  88    02 Sat (%RA)  97 %    Modified Borg Scale for Dyspnea  6-    Perceived Rate of Exertion  (Borg)  13- Somewhat hard      6 minute walk test results    Aerobic Endurance Distance Walked  1529    Endurance additional comments  One seated rest break to tie shoe (time stopped). No LOB noted, pt rested after test 2/2 incr. BP. BP after seated rest break: 155/82 and HR: 77bpm.                          PT Education - 01/08/17 1159    Education provided  Yes    Education Details  PT educated pt  on the importance of assessing BP prior to and after amb. at home, as BP did incr. during 6MWT today.     Person(s) Educated  Patient;Spouse    Methods  Explanation    Comprehension  Verbalized understanding       PT Short Term Goals - 12/31/16 1623      PT SHORT TERM GOAL #1   Title  same as LTGs        PT Long Term Goals - 01/08/17 1202      PT LONG TERM GOAL #1   Title  Pt will be IND in HEP to improve balance, strength, and flexibility. TARGET DATE FOR ALL LTGS: 01/28/17    Status  New      PT LONG TERM GOAL #2   Title  Pt will improve FGA score to >/=27/30 to decr. falls risk.     Status  New      PT LONG TERM GOAL #3   Title  Pt will amb. 1000' over even/uneven terrain while perform dynamic gait activities (head turns/nods/negotiating obstacles) IND in order to improve safety during functional mobility.     Status  New      PT LONG TERM GOAL #4   Title  Perform 6MWT and write goal as indicated.     Status  Achieved      PT LONG TERM GOAL #5   Title  Pt will improve 6MWT distance from 1529' to 1729' to improve endurance.     Status  New            Plan - 01/08/17 1200    Clinical Impression Statement  Today's session limited to 6MWT only as pt arrived late and required seated rest break to allow BP to subside after testing. PT encouraged pt to decr. speed during amb. while walking longer distances 2/2 incr. BP and fatigue after 6MWT. Continue with POC.     Rehab Potential  Good    Clinical Impairments Affecting Rehab Potential  see above.     PT Frequency  2x / week    PT Duration  4 weeks    PT Treatment/Interventions  ADLs/Self Care Home Management;Biofeedback;Canalith Repostioning;Electrical Stimulation;Therapeutic exercise;Manual techniques;Vestibular;Therapeutic activities;Functional mobility training;Orthotic Fit/Training;Stair training;Gait training;Patient/family education;DME Instruction;Neuromuscular re-education;Balance training    PT Next Visit Plan  Check BP. (OTAGO) Initiate balance, strengthening, and flexibility HEP    Consulted and Agree with Plan of Care  Patient;Family member/caregiver    Family Member Consulted  pt's wife: [name]       Patient will benefit from skilled therapeutic intervention in order to improve the following deficits and impairments:  Abnormal gait, Decreased endurance, Impaired sensation, Decreased strength, Decreased knowledge of use of DME, Decreased balance, Decreased mobility, Impaired flexibility  Visit Diagnosis: Other abnormalities of gait and mobility     Problem List Patient Active Problem List   Diagnosis Date Noted  . Preventative health care 06/19/2016  . Increased prostate specific antigen (PSA) velocity 06/19/2016  . Gait disorder 01/27/2016  . Chronic anticoagulation 12/18/2015  . Hyperglycemia 06/17/2015  . CAD (coronary artery disease), native coronary artery 03/12/2015  . Atrial fibrillation with rapid ventricular response ([place])   . Encounter for therapeutic drug monitoring 06/20/2013  . Solitary pulmonary nodule 05/24/2013  . Anxiety state 05/21/2012  . Chronic low back pain 05/21/2012  . Long term (current) use of anticoagulants 04/24/2012  . Hearing loss of both ears 11/11/2010  . FATIGUE 11/16/2009  . VERTIGO 04/08/2007  . MELANOMA, MALIGNANT, SKIN  NOS 09/29/2006  . Hyperlipidemia 09/29/2006  . ERECTILE DYSFUNCTION 09/29/2006  . VENTRICULAR TACHYCARDIA 09/29/2006  . BENIGN PROSTATIC HYPERTROPHY 09/29/2006  . OSTEOARTHROSIS NOS, OTHER [facility] SITE 09/29/2006     Wendy Hoback L 01/08/2017, 12:03 PM  [facility] [address] [place], [place], [address] Phone: [phone]   Fax:  [phone]  Name: Jonathan Orozco MRN: [id number] Date of Birth: [birth year]  Geoffry Paradise, PT,DPT 01/08/17 12:03 PM Phone: [phone] Fax: [phone]

## 2017-01-09 ENCOUNTER — Ambulatory Visit: Payer: Medicare Other

## 2017-01-09 VITALS — BP 157/88 | HR 82

## 2017-01-09 DIAGNOSIS — R2689 Other abnormalities of gait and mobility: Secondary | ICD-10-CM

## 2017-01-09 DIAGNOSIS — R208 Other disturbances of skin sensation: Secondary | ICD-10-CM

## 2017-01-09 DIAGNOSIS — M6281 Muscle weakness (generalized): Secondary | ICD-10-CM

## 2017-01-09 NOTE — Therapy (Signed)
[facility] [address] [place], [place], [address] Phone: [phone]   Fax:  [phone]  Physical Therapy Treatment  Patient Details  Name: Jonathan Orozco MRN: [id number] Date of Birth: May 30, 1928 Referring Provider: Dr. Tomi Likens   Encounter Date: 01/09/2017  PT End of Session - 01/09/17 1012    Visit Number  3    Number of Visits  9    Date for PT Re-Evaluation  01/30/17    Authorization Type  [employer] Medicare: G-CODE AND PN EVERY 10TH VISIT.     PT Start Time  [phone]    PT Stop Time  1010    PT Time Calculation (min)  39 min    Equipment Utilized During Treatment  -- min guard to S prn    Activity Tolerance  Patient tolerated treatment well    Behavior During Therapy  WFL for tasks assessed/performed       Past Medical History:  Diagnosis Date  . ABUSE, ALCOHOL, IN REMISSION 04/08/2007  . ALLERGIC RHINITIS 09/29/2006  . Arthritis    "hands" (07/02/2016)  . Atrial fibrillation ([place])   . Atrial fibrillation with RVR ([place]) 07/02/2016  . Atrial flutter ([place]) 03/28/2010   a. s/p prior rfca;  b. recurrent paroxysmal flutter 04/2012;  c. pradaxa initiated 04/2012.  [name] BENIGN PROSTATIC HYPERTROPHY 09/29/2006  . BPPV (benign paroxysmal positional vertigo) 04/08/2007  . CAD (coronary artery disease)    a. LHC 2/17: EF 55-65%, LM 75, pLAD 75, oD1 75, oD2 65, D3 85, oLCx 99, oOM1 75, pRCA 25 >> CABG  . Carotid stenosis    a. Carotid US 2/17:  Bilateral ICA 1-39% ICA  . Chronic lower back pain   . COLONIC POLYPS, HX OF 06/23/2007  . 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 Diverticulosis   . ERECTILE DYSFUNCTION 09/29/2006  . GERD (gastroesophageal reflux disease)   . GLUCOSE INTOLERANCE 03/19/2010  . HYPERLIPIDEMIA 09/29/2006  . HYPERTENSION 09/29/2006  . Long term (current) use of anticoagulants 04/26/2010  . MELANOMA, MALIGNANT, SKIN NOS 09/29/2006   other skin cancers -no further melanoma  . OSTEOARTHROSIS NOS, OTHER [facility] SITE  09/29/2006  . Other specified forms of hearing loss 08/10/2009  . Pneumonia ~ 1969  . Presence of permanent cardiac pacemaker 06/02/2016  . VENTRICULAR TACHYCARDIA 09/29/2006    Past Surgical History:  Procedure Laterality Date  . AV NODE ABLATION  07/02/2016  . AV NODE ABLATION N/A 07/02/2016   Procedure: AV Node Ablation;  Surgeon: Deboraha Sprang, MD;  Location: [place] CV LAB;  Service: Cardiovascular;  Laterality: N/A;  . CARDIAC CATHETERIZATION N/A 03/12/2015   Procedure: Left Heart Cath and Coronary Angiography;  Surgeon: [name], MD;  Location: [place] CV LAB;  Service: Cardiovascular;  Laterality: N/A;  . CARDIOVERSION N/A 12/19/2015   Procedure: CARDIOVERSION;  Surgeon: [name], MD;  Location: [facility] ENDOSCOPY;  Service: Cardiovascular;  Laterality: N/A;  . CARDIOVERSION N/A 03/24/2016   Procedure: CARDIOVERSION;  Surgeon: [name], MD;  Location: [facility];  Service: Cardiovascular;  Laterality: N/A;  . CATARACT EXTRACTION W/ INTRAOCULAR LENS  IMPLANT, BILATERAL Bilateral   . CLIPPING OF ATRIAL APPENDAGE N/A 03/14/2015   Procedure: CLIPPING OF ATRIAL APPENDAGE;  Surgeon: [name], MD;  Location: [facility];  Service: Open Heart Surgery;  Laterality: N/A;  . COLONOSCOPY WITH PROPOFOL N/A 12/08/2014   Procedure: COLONOSCOPY WITH PROPOFOL;  Surgeon: [name], MD;  [facility]: [facility] ENDOSCOPY;  Service: Endoscopy;  Laterality:  N/A;  . CORONARY ARTERY BYPASS GRAFT N/A 03/14/2015   Procedure: CORONARY ARTERY BYPASS GRAFTING (CABG) x 4 (LIMA to LAD, SVG to DIAGONAL 2, SVG SEQUENTIALLY to OM1 and OM2);  Surgeon: [name], MD;  Location: [facility];  Service: Open Heart Surgery;  Laterality: N/A;  . INCISION AND DRAINAGE N/A 07/03/2016   Procedure: INCISION AND DRAINAGE of CHEST ABSCESS;  Surgeon: [name], MD;  Location: [facility];  Service: Thoracic;  Laterality: N/A;  . INSERT / REPLACE / REMOVE PACEMAKER  06/02/2016  . JOINT REPLACEMENT    . KNEE  ARTHROSCOPY Left   . MELANOMA EXCISION     "upper back"  . PACEMAKER IMPLANT N/A 06/02/2016   Procedure: Pacemaker Implant;  Surgeon: Deboraha Sprang, MD;  Location: [place] CV LAB;  Service: Cardiovascular;  Laterality: N/A;  . SHOULDER OPEN ROTATOR CUFF REPAIR Left   . SKIN CANCER EXCISION Right    "cheek; real deep"  . TEE WITHOUT CARDIOVERSION N/A 03/14/2015   Procedure: TRANSESOPHAGEAL ECHOCARDIOGRAM (TEE);  Surgeon: [name], MD;  Location: [facility];  Service: Open Heart Surgery;  Laterality: N/A;  . TEE WITHOUT CARDIOVERSION N/A 12/19/2015   Procedure: TRANSESOPHAGEAL ECHOCARDIOGRAM (TEE);  Surgeon: [name], MD;  Location: [place];  Service: Cardiovascular;  Laterality: N/A;  . TEE WITHOUT CARDIOVERSION N/A 03/24/2016   Procedure: TRANSESOPHAGEAL ECHOCARDIOGRAM (TEE);  Surgeon: [name], MD;  Location: [place];  Service: Cardiovascular;  Laterality: N/A;  . TONSILLECTOMY    . TOTAL KNEE ARTHROPLASTY Right     Vitals:   01/09/17 0935  BP: (!) 157/88  Pulse: 82    Subjective Assessment - 01/09/17 0935    Subjective  Pt denied falls or changes since last visit.     Patient is accompained by:  Family member Jonathan Orozco: wife    Pertinent History  A-fib, CAD, hearing loss, pacemaker, HLD, hx of vertigo, chronic LBP, arthritis, hx of skin CA    Patient Stated Goals  I want to get to be balanced.     Currently in Pain?  No/denies                       Balance Exercises - 01/09/17 0943      OTAGO PROGRAM   Head Movements  Standing;5 reps    Neck Movements  Sitting;5 reps with pillow behind head    Back Extension  Standing;5 reps    Trunk Movements  Standing;5 reps    Ankle Movements  Sitting;10 reps    Knee Extensor  Other reps (comment) x10reps no weight, 2x10reps with 5lbs.    Knee Flexor  20 reps 2 pounds    Hip ABductor  20 reps    Overall OTAGO Comments  Pt required incr. time for cues and demo to ensure proper technique.          PT Education - 01/09/17 1011    Education provided  Yes    Education Details  Initiated OTAGO program.    Person(s) Educated  Patient;Spouse    Methods  Explanation;Demonstration;Tactile cues;Verbal cues;Handout    Comprehension  Returned demonstration;Verbalized understanding       PT Short Term Goals - 12/31/16 1623      PT SHORT TERM GOAL #1   Title  same as LTGs        PT Long Term Goals - 01/08/17 1202      PT LONG TERM GOAL #1   Title  Pt will be IND in HEP to improve balance, strength, and flexibility. TARGET DATE FOR ALL LTGS: 01/28/17    Status  New      PT LONG TERM GOAL #2   Title  Pt will improve FGA score to >/=27/30 to decr. falls risk.     Status  New      PT LONG TERM GOAL #3   Title  Pt will amb. 1000' over even/uneven terrain while perform dynamic gait activities (head turns/nods/negotiating obstacles) IND in order to improve safety during functional mobility.     Status  New      PT LONG TERM GOAL #4   Title  Perform 6MWT and write goal as indicated.     Status  Achieved      PT LONG TERM GOAL #5   Title  Pt will improve 6MWT distance from 1529' to 1729' to improve endurance.     Status  New            Plan - 01/09/17 1012    Clinical Impression Statement  Today's skilled session focused on initiating OTAGO HEP to improve posture, strength, and balance. Pt required cues to attend to task (as he's easily distracted) but did tolerated activities well as he denied pain. Pt would continue to benefit from skilled PT to improve safety during functional mobility.     Rehab Potential  Good    Clinical Impairments Affecting Rehab Potential  see above.    PT Frequency  2x / week    PT Duration  4 weeks    PT Treatment/Interventions  ADLs/Self Care Home Management;Biofeedback;Canalith Repostioning;Electrical Stimulation;Therapeutic exercise;Manual techniques;Vestibular;Therapeutic activities;Functional mobility training;Orthotic Fit/Training;Stair  training;Gait training;Patient/family education;DME Instruction;Neuromuscular re-education;Balance training    PT Next Visit Plan  Check BP. (Finish OTAGO HEP ) Initiate balance, strengthening, and flexibility HEP    Consulted and Agree with Plan of Care  Patient;Family member/caregiver    Family Member Consulted  pt's wife: Jonathan Orozco       Patient will benefit from skilled therapeutic intervention in order to improve the following deficits and impairments:  Abnormal gait, Decreased endurance, Impaired sensation, Decreased strength, Decreased knowledge of use of DME, Decreased balance, Decreased mobility, Impaired flexibility  Visit Diagnosis: Other abnormalities of gait and mobility  Muscle weakness (generalized)  Other disturbances of skin sensation     Problem List Patient Active Problem List   Diagnosis Date Noted  . Preventative health care 06/19/2016  . Increased prostate specific antigen (PSA) velocity 06/19/2016  . Gait disorder 01/27/2016  . Chronic anticoagulation 12/18/2015  . Hyperglycemia 06/17/2015  . CAD (coronary artery disease), native coronary artery 03/12/2015  . Atrial fibrillation with rapid ventricular response ([place])   . Encounter for therapeutic drug monitoring 06/20/2013  . Solitary pulmonary nodule 05/24/2013  . Anxiety state 05/21/2012  . Chronic low back pain 05/21/2012  . Long term (current) use of anticoagulants 04/24/2012  . Hearing loss of both ears 11/11/2010  . FATIGUE 11/16/2009  . VERTIGO 04/08/2007  . MELANOMA, MALIGNANT, SKIN NOS 09/29/2006  . Hyperlipidemia 09/29/2006  . ERECTILE DYSFUNCTION 09/29/2006  . VENTRICULAR TACHYCARDIA 09/29/2006  . BENIGN PROSTATIC HYPERTROPHY 09/29/2006  . OSTEOARTHROSIS NOS, OTHER [facility] SITE 09/29/2006    Barrington Worley L 01/09/2017, 10:14 AM  [facility] [address] [place], [place], [address] Phone: [phone]   Fax:  [phone]  Name:  Jonathan Orozco MRN: [id number] Date of Birth: 01-Jun-1928  Geoffry Paradise, PT,DPT 01/09/17 10:14 AM Phone: [phone] Fax:  [phone]   

## 2017-01-13 ENCOUNTER — Ambulatory Visit: Payer: Medicare Other

## 2017-01-15 ENCOUNTER — Other Ambulatory Visit: Payer: Self-pay | Admitting: Internal Medicine

## 2017-01-15 MED ORDER — WARFARIN SODIUM 5 MG PO TABS: ORAL_TABLET | ORAL | 1 refills | Status: DC

## 2017-01-15 NOTE — Telephone Encounter (Signed)
°*  STAT* If patient is at the pharmacy, call can be transferred to refill team.   1. Which medications need to be refilled? (please list name of each medication and dose if known) Warfarin 5mg  ( Needs a Renewal )   2. Which pharmacy/location (including street and city if local pharmacy) is medication to be sent to?[facility]   3. Do they need a 30 day or 90 day supply? [place]

## 2017-01-16 ENCOUNTER — Ambulatory Visit: Payer: Medicare Other

## 2017-01-16 ENCOUNTER — Telehealth: Payer: Self-pay | Admitting: Internal Medicine

## 2017-01-16 VITALS — BP 173/86 | HR 71

## 2017-01-16 DIAGNOSIS — R2689 Other abnormalities of gait and mobility: Secondary | ICD-10-CM

## 2017-01-16 NOTE — Telephone Encounter (Signed)
Spoke with patient and wife on speaker phone. Wife states patient's BP was elevated today at his neuro rehab appointment. She states 2 of the readings given were from the electric cuff and the last one was done manually.  The manual BP was 173/86 mmHg and pulse was 66 bpm. She states BP readings at home have been normal. I asked if patient had eaten more salt today or was anxious about the appointment. She states that he said he felt anxious on the way to the appointment which is the feeling he gets when BP is elevated. I asked about symptoms at present and he states he is starting to feel more normal. He states he continues to have a fib but no other complaints. He is not on any BP medicines. Patient has an appointment on 12/31. I reviewed schedules for next week and advised that I do not see a sooner appointment. I asked wife to call PCP, Dr. Gwynn Burly office to see if a sooner appointment is available and if not, to call back to see if there are any cancellations for next week. I advised them to continue to monitor HR and BP. Wife verbalized understanding and agreement and thanked me for my help.

## 2017-01-16 NOTE — Therapy (Signed)
[facility] [address] [place], [place], [address] Phone: [phone]   Fax:  [phone]  Physical Therapy Treatment  Patient Details  Name: Jonathan Orozco MRN: [id number] Date of Birth: [birth year] Referring Provider: Dr. Tomi Likens   Encounter Date: 01/16/2017  PT End of Session - 01/16/17 1131    Visit Number  3 no charge    Number of Visits  9    Date for PT Re-Evaluation  01/30/17    Authorization Type  [employer] Medicare: G-CODE AND PN EVERY 10TH VISIT.     PT Start Time  1105    PT Stop Time  1129    PT Time Calculation (min)  24 min    Activity Tolerance  Treatment limited secondary to medical complications (Comment)    Behavior During Therapy  [facility] for tasks assessed/performed       Past Medical History:  Diagnosis Date  . ABUSE, ALCOHOL, IN REMISSION 04/08/2007  . ALLERGIC RHINITIS 09/29/2006  . Arthritis    "hands" (07/02/2016)  . Atrial fibrillation ([place])   . Atrial fibrillation with RVR ([place]) 07/02/2016  . Atrial flutter ([place]) 03/28/2010   a. s/p prior rfca;  b. recurrent paroxysmal flutter 04/2012;  c. pradaxa initiated 04/2012.  [name] BENIGN PROSTATIC HYPERTROPHY 09/29/2006  . BPPV (benign paroxysmal positional vertigo) 04/08/2007  . CAD (coronary artery disease)    a. LHC 2/17: EF 55-65%, LM 75, pLAD 75, oD1 75, oD2 65, D3 85, oLCx 99, oOM1 75, pRCA 25 >> CABG  . Carotid stenosis    a. Carotid US 2/17:  Bilateral ICA 1-39% ICA  . Chronic lower back pain   . COLONIC POLYPS, HX OF 06/23/2007  . 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 Diverticulosis   . ERECTILE DYSFUNCTION 09/29/2006  . GERD (gastroesophageal reflux disease)   . GLUCOSE INTOLERANCE 03/19/2010  . HYPERLIPIDEMIA 09/29/2006  . HYPERTENSION 09/29/2006  . Long term (current) use of anticoagulants 04/26/2010  . MELANOMA, MALIGNANT, SKIN NOS 09/29/2006   other skin cancers -no further melanoma  . OSTEOARTHROSIS NOS, OTHER [facility] SITE 09/29/2006  . Other  specified forms of hearing loss 08/10/2009  . Pneumonia ~ 1969  . Presence of permanent cardiac pacemaker 06/02/2016  . VENTRICULAR TACHYCARDIA 09/29/2006    Past Surgical History:  Procedure Laterality Date  . AV NODE ABLATION  07/02/2016  . AV NODE ABLATION N/A 07/02/2016   Procedure: AV Node Ablation;  Surgeon: Deboraha Sprang, MD;  Location: [place] CV LAB;  Service: Cardiovascular;  Laterality: N/A;  . CARDIAC CATHETERIZATION N/A 03/12/2015   Procedure: Left Heart Cath and Coronary Angiography;  Surgeon: [name], MD;  Location: [place] CV LAB;  Service: Cardiovascular;  Laterality: N/A;  . CARDIOVERSION N/A 12/19/2015   Procedure: CARDIOVERSION;  Surgeon: [name], MD;  Location: [facility] ENDOSCOPY;  Service: Cardiovascular;  Laterality: N/A;  . CARDIOVERSION N/A 03/24/2016   Procedure: CARDIOVERSION;  Surgeon: [name], MD;  Location: [place];  Service: Cardiovascular;  Laterality: N/A;  . CATARACT EXTRACTION W/ INTRAOCULAR LENS  IMPLANT, BILATERAL Bilateral   . CLIPPING OF ATRIAL APPENDAGE N/A 03/14/2015   Procedure: CLIPPING OF ATRIAL APPENDAGE;  Surgeon: [name], MD;  Location: [place];  Service: Open Heart Surgery;  Laterality: N/A;  . COLONOSCOPY WITH PROPOFOL N/A 12/08/2014   Procedure: COLONOSCOPY WITH PROPOFOL;  Surgeon: [name], MD;  Location: [facility] ENDOSCOPY;  Service: Endoscopy;  Laterality: N/A;  . CORONARY ARTERY BYPASS GRAFT N/A 03/14/2015  Procedure: CORONARY ARTERY BYPASS GRAFTING (CABG) x 4 (LIMA to LAD, SVG to DIAGONAL 2, SVG SEQUENTIALLY to OM1 and OM2);  Surgeon: [name], MD;  Location: [place];  Service: Open Heart Surgery;  Laterality: N/A;  . INCISION AND DRAINAGE N/A 07/03/2016   Procedure: INCISION AND DRAINAGE of CHEST ABSCESS;  Surgeon: [name], MD;  Location: [place];  Service: Thoracic;  Laterality: N/A;  . INSERT / REPLACE / REMOVE PACEMAKER  06/02/2016  . JOINT REPLACEMENT    . KNEE ARTHROSCOPY Left    . MELANOMA EXCISION     "upper back"  . PACEMAKER IMPLANT N/A 06/02/2016   Procedure: Pacemaker Implant;  Surgeon: Deboraha Sprang, MD;  Location: [place] CV LAB;  Service: Cardiovascular;  Laterality: N/A;  . SHOULDER OPEN ROTATOR CUFF REPAIR Left   . SKIN CANCER EXCISION Right    "cheek; real deep"  . TEE WITHOUT CARDIOVERSION N/A 03/14/2015   Procedure: TRANSESOPHAGEAL ECHOCARDIOGRAM (TEE);  Surgeon: [name], MD;  Location: [place];  Service: Open Heart Surgery;  Laterality: N/A;  . TEE WITHOUT CARDIOVERSION N/A 12/19/2015   Procedure: TRANSESOPHAGEAL ECHOCARDIOGRAM (TEE);  Surgeon: [name], MD;  Location: [place];  Service: Cardiovascular;  Laterality: N/A;  . TEE WITHOUT CARDIOVERSION N/A 03/24/2016   Procedure: TRANSESOPHAGEAL ECHOCARDIOGRAM (TEE);  Surgeon: [name], MD;  Location: [place];  Service: Cardiovascular;  Laterality: N/A;  . TONSILLECTOMY    . TOTAL KNEE ARTHROPLASTY Right     Vitals:   01/16/17 1111 01/16/17 1117 01/16/17 1122  BP: (!) 174/95 (!) 170/92 (!) 173/86  Pulse: 83  71  First and third readings: machine Second reading: manual   Subjective Assessment - 01/16/17 1108    Subjective  Pt denied falls or changes since last visit. Pt reports he is nervous, similar to the feeling when his heart is out of rhythm.     Patient is accompained by:  -- [name]: wife    Pertinent History  A-fib, CAD, hearing loss, pacemaker, HLD, hx of vertigo, chronic LBP, arthritis, hx of skin CA    Patient Stated Goals  I want to get to be balanced.     Currently in Pain?  No/denies                                PT Short Term Goals - 12/31/16 1623      PT SHORT TERM GOAL #1   Title  same as LTGs        PT Long Term Goals - 01/08/17 1202      PT LONG TERM GOAL #1   Title  Pt will be IND in HEP to improve balance, strength, and flexibility. TARGET DATE FOR ALL LTGS: 01/28/17    Status  New      PT LONG TERM GOAL  #2   Title  Pt will improve FGA score to >/=27/30 to decr. falls risk.     Status  New      PT LONG TERM GOAL #3   Title  Pt will amb. 1000' over even/uneven terrain while perform dynamic gait activities (head turns/nods/negotiating obstacles) IND in order to improve safety during functional mobility.     Status  New      PT LONG TERM GOAL #4   Title  Perform 6MWT and write goal as indicated.     Status  Achieved  PT LONG TERM GOAL #5   Title  Pt will improve 6MWT distance from 1529' to 1729' to improve endurance.     Status  New            Plan - 01/16/17 1132    Clinical Impression Statement  No charge for today's visit 2/2 incr. BP and pt reporting "nervous" feeling similar to when heart is out of rhythm. Pt's HR fluctuated during session (66-83bpm at rest), and pt has a pacemaker. PT educated pt on calling cardiology office (pt's wife called in PT clinic and was on hold) in order to notify cardiologist. PT will hold care until pt has parameters set by cardiologist and BP/HR has been addressed.     Rehab Potential  Good    Clinical Impairments Affecting Rehab Potential  see above.    PT Frequency  2x / week    PT Duration  4 weeks    PT Treatment/Interventions  ADLs/Self Care Home Management;Biofeedback;Canalith Repostioning;Electrical Stimulation;Therapeutic exercise;Manual techniques;Vestibular;Therapeutic activities;Functional mobility training;Orthotic Fit/Training;Stair training;Gait training;Patient/family education;DME Instruction;Neuromuscular re-education;Balance training    PT Next Visit Plan  Hold until cardiologist clears pt and addresses elevated resting BP and fluctuating HR at rest. Check BP. (Finish OTAGO HEP ) Initiate balance, strengthening, and flexibility HEP    Consulted and Agree with Plan of Care  Patient;Family member/caregiver    Family Member Consulted  pt's wife: [name]       Patient will benefit from skilled therapeutic intervention in order to  improve the following deficits and impairments:  Abnormal gait, Decreased endurance, Impaired sensation, Decreased strength, Decreased knowledge of use of DME, Decreased balance, Decreased mobility, Impaired flexibility  Visit Diagnosis: Other abnormalities of gait and mobility     Problem List Patient Active Problem List   Diagnosis Date Noted  . Preventative health care 06/19/2016  . Increased prostate specific antigen (PSA) velocity 06/19/2016  . Gait disorder 01/27/2016  . Chronic anticoagulation 12/18/2015  . Hyperglycemia 06/17/2015  . CAD (coronary artery disease), native coronary artery 03/12/2015  . Atrial fibrillation with rapid ventricular response ([place])   . Encounter for therapeutic drug monitoring 06/20/2013  . Solitary pulmonary nodule 05/24/2013  . Anxiety state 05/21/2012  . Chronic low back pain 05/21/2012  . Long term (current) use of anticoagulants 04/24/2012  . Hearing loss of both ears 11/11/2010  . FATIGUE 11/16/2009  . VERTIGO 04/08/2007  . MELANOMA, MALIGNANT, SKIN NOS 09/29/2006  . Hyperlipidemia 09/29/2006  . ERECTILE DYSFUNCTION 09/29/2006  . VENTRICULAR TACHYCARDIA 09/29/2006  . BENIGN PROSTATIC HYPERTROPHY 09/29/2006  . OSTEOARTHROSIS NOS, OTHER [facility] SITE 09/29/2006    Miller,Jennifer L 01/16/2017, 11:41 AM  [facility] [address] [place], [place], [address] Phone: [phone]   Fax:  [phone]  Name: Jonathan Orozco MRN: [id number] Date of Birth: 20-Aug-1928  Geoffry Paradise, PT,DPT 01/16/17 11:41 AM Phone: [phone] Fax: [phone]

## 2017-01-16 NOTE — Telephone Encounter (Signed)
New Message    Wife called this in:  Pt c/o BP issue: STAT if pt c/o blurred vision, one-sided weakness or slurred speech  1. What are your last 5 BP readings? 174/95 170 /92 173/86   Hr 71 then 83 then 66   2. Are you having any other symptoms (ex. Dizziness, headache, blurred vision, passed out)? Pt states he feel anxious or nervous   3. What is your BP issue?  Pt was at neuro rehab and they took his bp and it was elevated and they told the patient to call and get a appt with [name] or Pa for ASAP

## 2017-01-19 ENCOUNTER — Ambulatory Visit: Payer: Medicare Other

## 2017-01-19 DIAGNOSIS — R2689 Other abnormalities of gait and mobility: Secondary | ICD-10-CM | POA: Diagnosis not present

## 2017-01-19 NOTE — Therapy (Signed)
[facility] [address] [place], [place], [address] Phone: [phone]   Fax:  [phone]  Patient Details  Name: Jonathan Orozco MRN: [id number] Date of Birth: [birth year] Referring Provider:  No ref. provider found  Encounter Date: 2017-01-29   PHYSICAL THERAPY DISCHARGE SUMMARY  Visits from Start of Care: 3  Current functional level related to goals / functional outcomes: PT Long Term Goals - 01/08/17 1202      PT LONG TERM GOAL #1   Title  Pt will be IND in HEP to improve balance, strength, and flexibility. TARGET DATE FOR ALL LTGS: 01/28/17    Status  New      PT LONG TERM GOAL #2   Title  Pt will improve FGA score to >/=27/30 to decr. falls risk.     Status  New      PT LONG TERM GOAL #3   Title  Pt will amb. 1000' over even/uneven terrain while perform dynamic gait activities (head turns/nods/negotiating obstacles) IND in order to improve safety during functional mobility.     Status  New      PT LONG TERM GOAL #4   Title  Perform 6MWT and write goal as indicated.     Status  Achieved      PT LONG TERM GOAL #5   Title  Pt will improve 6MWT distance from 1529' to 1729' to improve endurance.     Status  New         Remaining deficits: Same as last visit. Pt cancelled remaining appt's, as he's in "bad health" and will get an new MD order for later next year.   Education / Equipment: HEP and had pt f/u with MD regarding elevated BP and fluctuating heart rate.   Plan: Patient agrees to discharge.  Patient goals were not met. Patient is being discharged due to a change in medical status.  ?????        G-Codes - 2017-01-29 1349    Functional Assessment Tool Used (Outpatient Only)  FGA: 19/30 (same as eval)    Functional Limitation  Mobility: Walking and moving around    Mobility: Walking and Moving Around Goal Status [phone])  At least 1 percent but less than 20 percent impaired, limited or restricted     Mobility: Walking and Moving Around Discharge Status [phone])  At least 40 percent but less than 60 percent impaired, limited or restricted        Yolanda Huffstetler L 29-Jan-2017, 1:49 PM  [facility] [address] [place], [place], [address] Phone: [phone]   Fax:  [phone]   Geoffry Paradise, PT,DPT Jan 29, 2017 1:51 PM Phone: [phone] Fax: [phone]

## 2017-01-23 ENCOUNTER — Ambulatory Visit: Payer: Medicare Other

## 2017-01-28 ENCOUNTER — Ambulatory Visit: Payer: Medicare Other

## 2017-01-30 ENCOUNTER — Ambulatory Visit: Payer: Medicare Other

## 2017-02-01 NOTE — Progress Notes (Signed)
Cardiology Office Note Date:  02/02/2017  Patient ID:  Jonathan, Orozco [birth year], MRN [id number] PCP:  Biagio Borg, MD  Cardiologist/Electrophysiologist; Dr. [name]   Chief Complaint:  Planned EP follow up  History of Present Illness: Jonathan Orozco is a 83 y.o. male with history of CAD (03/14/15 CABG  LIMA > LAD, SVG > 2nd Diag and OM 1/2 , also had LAA clipping ), HTN, HFpEF, symptomatic bradycardia w/PPM, uncontrolled rapid AFib, Atypical AFlutter now s/p AVN ablation, RVOT VT intolerant of amiodarone (patient states with sun sensitivity and visual changes), chronic LBP  He comes today to be seen for Dr. [name].  Last seen by him in July, at that visit device was interrogated and reprogrammed for longevity, his CCB stopped with lower BP noted and referred to PT for balance issues.  He saw neurology, Dr. Tomi Likens 12/08/16 2/2 gait instability, exam not felt to suggest intracranial  issue, and referred to PT.  He saw Dr. Jenny Reichmann PMD 12/23/16, his BP was 116/78. I note his last PT note mentioned patient cancelled remaining visits 2/2 poor health and would have MD reorder when able, also mention elevated BP 170's/90's and ?? Fluctuating HR. 63-83bpm at rest, patient has reported to PT feeling nervous like he was out of rhythm.  The patient comes accompanied by his wife.  He reports feeling very well and thankful for this given he is about to be 89.  His wife is in agreement.  I discussed recent notes with PT that he had cancelled his remaining session due to not feeling well, BP, HR concerns.  He states only one day did he feel unusually poor, his BP was elevated and he blames that on being upset with the staff, and generally feels like the exercises he does there he can do at home, this being the main reason he cancelled.  He feels like his gait is slightly improved.  He denies any kind of CP, palpitations, no SOB or DOE. No dizziness, near syncope or syncope.  He denies any bleeding or signs of  bleeding.   He does question the HR variability and once being told his HR was 69, and was never supposed to go below 70, we discussed his pacer programming, this made him feel at ease.  Device information MDT single chamber PPM implanted 06/02/16, Dr. [name] DEVICE DEPENDENT s/p AVNode ablation  Past Medical History:  Diagnosis Date  . ABUSE, ALCOHOL, IN REMISSION 04/08/2007  . ALLERGIC RHINITIS 09/29/2006  . Arthritis    "hands" (07/02/2016)  . Atrial fibrillation ([place])   . Atrial fibrillation with RVR ([place]) 07/02/2016  . Atrial flutter ([place]) 03/28/2010   a. s/p prior rfca;  b. recurrent paroxysmal flutter 04/2012;  c. pradaxa initiated 04/2012.  [name] BENIGN PROSTATIC HYPERTROPHY 09/29/2006  . BPPV (benign paroxysmal positional vertigo) 04/08/2007  . CAD (coronary artery disease)    a. LHC 2/17: EF 55-65%, LM 75, pLAD 75, oD1 75, oD2 65, D3 85, oLCx 99, oOM1 75, pRCA 25 >> CABG  . Carotid stenosis    a. Carotid US 2/17:  Bilateral ICA 1-39% ICA  . Chronic lower back pain   . COLONIC POLYPS, HX OF 06/23/2007  . 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 Diverticulosis   . ERECTILE DYSFUNCTION 09/29/2006  . GERD (gastroesophageal reflux disease)   . GLUCOSE INTOLERANCE 03/19/2010  . HYPERLIPIDEMIA 09/29/2006  . HYPERTENSION 09/29/2006  . Long term (current) use of anticoagulants 04/26/2010  . MELANOMA, MALIGNANT, SKIN NOS  09/29/2006   other skin cancers -no further melanoma  . OSTEOARTHROSIS NOS, OTHER [facility] SITE 09/29/2006  . Other specified forms of hearing loss 08/10/2009  . Pneumonia ~ 1969  . Presence of permanent cardiac pacemaker 06/02/2016  . VENTRICULAR TACHYCARDIA 09/29/2006    Past Surgical History:  Procedure Laterality Date  . AV NODE ABLATION  07/02/2016  . AV NODE ABLATION N/A 07/02/2016   Procedure: AV Node Ablation;  Surgeon: Deboraha Sprang, MD;  Location: [place] [facility];  Service: Cardiovascular;  Laterality: N/A;  . CARDIAC CATHETERIZATION N/A 03/12/2015   Procedure: Left  Heart Cath and Coronary Angiography;  Surgeon: [name], MD;  Location: [place] [facility];  Service: Cardiovascular;  Laterality: N/A;  . CARDIOVERSION N/A 12/19/2015   Procedure: CARDIOVERSION;  Surgeon: [name], MD;  Location: [facility] ENDOSCOPY;  Service: Cardiovascular;  Laterality: N/A;  . CARDIOVERSION N/A 03/24/2016   Procedure: CARDIOVERSION;  Surgeon: [name], MD;  Location: [place];  Service: Cardiovascular;  Laterality: N/A;  . CATARACT EXTRACTION W/ INTRAOCULAR LENS  IMPLANT, BILATERAL Bilateral   . CLIPPING OF ATRIAL APPENDAGE N/A 03/14/2015   Procedure: CLIPPING OF ATRIAL APPENDAGE;  Surgeon: [name], MD;  Location: [place];  Service: Open Heart Surgery;  Laterality: N/A;  . COLONOSCOPY WITH PROPOFOL N/A 12/08/2014   Procedure: COLONOSCOPY WITH PROPOFOL;  Surgeon: [name], MD;  Location: [facility] ENDOSCOPY;  Service: Endoscopy;  Laterality: N/A;  . CORONARY ARTERY BYPASS GRAFT N/A 03/14/2015   Procedure: CORONARY ARTERY BYPASS GRAFTING (CABG) x 4 (LIMA to LAD, SVG to DIAGONAL 2, SVG SEQUENTIALLY to OM1 and OM2);  Surgeon: [name], MD;  Location: [place];  Service: Open Heart Surgery;  Laterality: N/A;  . INCISION AND DRAINAGE N/A 07/03/2016   Procedure: INCISION AND DRAINAGE of CHEST ABSCESS;  Surgeon: [name], MD;  Location: [place];  Service: Thoracic;  Laterality: N/A;  . INSERT / REPLACE / REMOVE PACEMAKER  06/02/2016  . JOINT REPLACEMENT    . KNEE ARTHROSCOPY Left   . MELANOMA EXCISION     "upper back"  . PACEMAKER IMPLANT N/A 06/02/2016   Procedure: Pacemaker Implant;  Surgeon: Deboraha Sprang, MD;  Location: [place] [facility];  Service: Cardiovascular;  Laterality: N/A;  . SHOULDER OPEN ROTATOR CUFF REPAIR Left   . SKIN CANCER EXCISION Right    "cheek; real deep"  . TEE WITHOUT CARDIOVERSION N/A 03/14/2015   Procedure: TRANSESOPHAGEAL ECHOCARDIOGRAM (TEE);  Surgeon: [name], MD;  Location: [place];  Service: Open  Heart Surgery;  Laterality: N/A;  . TEE WITHOUT CARDIOVERSION N/A 12/19/2015   Procedure: TRANSESOPHAGEAL ECHOCARDIOGRAM (TEE);  Surgeon: [name], MD;  Location: [place];  Service: Cardiovascular;  Laterality: N/A;  . TEE WITHOUT CARDIOVERSION N/A 03/24/2016   Procedure: TRANSESOPHAGEAL ECHOCARDIOGRAM (TEE);  Surgeon: [name], MD;  Location: [place];  Service: Cardiovascular;  Laterality: N/A;  . TONSILLECTOMY    . TOTAL KNEE ARTHROPLASTY Right     Current Outpatient Medications  Medication Sig Dispense Refill  . atorvastatin (LIPITOR) 10 MG tablet Take 1 tablet (10 mg total) by mouth daily. 90 tablet 3  . doxylamine, Sleep, (UNISOM) 25 MG tablet Take 25 mg by mouth at bedtime.    . fish oil-omega-3 fatty acids 1000 MG capsule Take 1 g by mouth every evening.     [name] HYDROcodone-acetaminophen (NORCO) 10-325 MG tablet Take 1 tablet by mouth daily as needed. 30 tablet 0  .  Multiple Vitamin (MULTIVITAMIN) tablet Take 1 tablet by mouth daily.      . Psyllium (METAMUCIL PO) 1 TABLESPOON IN A GLASS OF WATER ONCE DAILY    . tamsulosin (FLOMAX) 0.4 MG CAPS capsule Take 0.4 mg daily by mouth.    . warfarin (COUMADIN) 5 MG tablet Take as directed by Coumadin clinic. 100 tablet 1  . ZINC OXIDE EX Apply 1 application topically as needed (itching).     No current facility-administered medications for this visit.     Allergies:   Amiodarone hcl and Ace inhibitors   Social History:  The patient  reports that he has quit smoking. His smoking use included cigarettes. He has a 52.00 pack-year smoking history. he has never used smokeless tobacco. He reports that he does not drink alcohol or use drugs.   Family History:  The patient's family history includes Diabetes in his brother; Esophageal cancer in his father.  ROS:  Please see the history of present illness.  All other systems are reviewed and otherwise negative.   PHYSICAL EXAM:  VS:  BP 138/82   Pulse 83   Ht 5\' 10"   (1.778 m)   Wt 176 lb (79.8 kg)   BMI 25.25 kg/m  BMI: Body mass index is 25.25 kg/m. Well nourished, well developed, in no acute distress  HEENT: normocephalic, atraumatic  Neck: no JVD, carotid bruits or masses Cardiac:  RRR; soft SM, no rubs, or gallops Lungs:  CTA b/l, no wheezing, rhonchi or rales  Abd: soft, nontender MS: no deformity, age appropriate atrophy Ext: no edema  Skin: warm and dry, no rash Neuro:  No gross deficits appreciated Psych: euthymic mood, full affect  PPM site is stable, no tethering or discomfort   EKG:  Not done today PPM interrogation done today and reviewed by myself: battery and lead measurements are good, no VT  07/02/16: Procedure: HV measurement and AV junction ablation A total of 1.2 minutes of our energy were applied at sites where the his potential was identified. Complete heart block ensued. The patient had a junctional escape rate at about 40 bpm.  03/24/16: TEE/DCCV Study Conclusions - Left ventricle: Systolic function was normal. The estimated   ejection fraction was in the range of 55% to 60%. Wall motion was   normal; there were no regional wall motion abnormalities. - Aortic valve: Structurally normal valve. Trileaflet; normal   thickness leaflets. There was no significant regurgitation. - Mitral valve: There was mild to moderate regurgitation. - Left atrium: No evidence of thrombus in the atrial cavity. - Right ventricle: Systolic function was normal. - Right atrium: No evidence of thrombus in the atrial cavity or   appendage. - Tricuspid valve: There was mild regurgitation. Impressions: - TEE was followed by a successful cardioversion.  03/12/15: LHC 1. Prox RCA to Dist RCA lesion, 25% stenosed. 2. LM lesion, 75% stenosed. 3. Ost Cx lesion, 99% stenosed. 4. Ost 1st Mrg to 1st Mrg lesion, 75% stenosed. 5. Prox LAD to Mid LAD lesion, 75% stenosed. 6. Ost 1st Diag lesion, 75% stenosed. 7. Ost 2nd Diag to 2nd Diag lesion, 65%  stenosed. 8. 3rd Diag lesion, 85% stenosed.    CCS class III angina pectoris and dyspnea.  Calcific coronary artery disease with 75% distal left main, segmental 70-80% mid LAD stenosis, moderate 2 and 3 stenosis, 99% ostial circumflex, 70% proximal obtuse marginal 1, and luminal irregularities throughout a dominant right coronary.  Normal left ventricular systolic function with normal hemodynamics. EF greater than  50%. RECOMMENDATIONS:  Elderly but fit a 75-year-old gentleman with severe left main, circumflex, and LAD disease. Class III CCS functional status and progressive over the past 6 weeks.  We'll admit to the hospital, place on IV heparin, get TCTS consult to consider revascularization options.  Recent Labs: 12/23/2016: ALT 26; BUN 13; Creatinine, Ser 0.80; Hemoglobin 15.4; Platelets 216.0; Potassium 4.4; Sodium 136; TSH 0.52  12/23/2016: Cholesterol 128; HDL 53.40; LDL Cholesterol 64; Total CHOL/HDL Ratio 2; Triglycerides 56.0; VLDL 11.2   CrCl cannot be calculated (Patient's most recent lab result is older than the maximum 21 days allowed.).   Wt Readings from Last 3 Encounters:  02/02/17 176 lb (79.8 kg)  12/23/16 175 lb (79.4 kg)  12/08/16 176 lb 12.8 oz (80.2 kg)     Other studies reviewed: Additional studies/records reviewed today include: summarized above  ASSESSMENT AND PLAN:  1. Persistent uncontrolled AFib/atypical Aflutter, now s/p AVNode ablation (LA clipping)     CHA2DS2Vasc is 4, on Wrfarin, historically intolerant to Pradaxa     Warfarin is monitored and managed with coumadin clinic  2. Bradycardia w/PPM     Intact device functioin  3. CAD s/p CABG Feb 2017     No anginal complaints, on statin, lipids are monitored and managed with his PMD     No ASA given warfarin  4. HTN     The patient denies HTN reports his home BP checked routinely are in alignment with today's      He is reluctant to start medicine with some lower BP readings on diltiazem      Discussed importance of BP control, he will continue to monitor at home, and if routinely elevated they will call us   Disposition: F/u with Q 3 month remotes, in clinic in 6 months, sooner if needed.  Current medicines are reviewed at length with the patient today.  The patient did not have any concerns regarding medicines.  Venetia Night, PA-C 02/02/2017 11:40 AM     [facility] [address] [place] [address] [phone] (office)  [phone] (fax)

## 2017-02-02 ENCOUNTER — Ambulatory Visit (INDEPENDENT_AMBULATORY_CARE_PROVIDER_SITE_OTHER): Payer: Medicare Other | Admitting: Physician Assistant

## 2017-02-02 ENCOUNTER — Ambulatory Visit (INDEPENDENT_AMBULATORY_CARE_PROVIDER_SITE_OTHER): Payer: Medicare Other | Admitting: Pharmacist

## 2017-02-02 VITALS — BP 138/82 | HR 83 | Ht 70.0 in | Wt 176.0 lb

## 2017-02-02 DIAGNOSIS — I2510 Atherosclerotic heart disease of native coronary artery without angina pectoris: Secondary | ICD-10-CM

## 2017-02-02 DIAGNOSIS — I482 Chronic atrial fibrillation: Secondary | ICD-10-CM | POA: Diagnosis not present

## 2017-02-02 DIAGNOSIS — Z950 Presence of cardiac pacemaker: Secondary | ICD-10-CM

## 2017-02-02 DIAGNOSIS — I4821 Permanent atrial fibrillation: Secondary | ICD-10-CM

## 2017-02-02 DIAGNOSIS — I4891 Unspecified atrial fibrillation: Secondary | ICD-10-CM

## 2017-02-02 DIAGNOSIS — I10 Essential (primary) hypertension: Secondary | ICD-10-CM | POA: Diagnosis not present

## 2017-02-02 DIAGNOSIS — Z5181 Encounter for therapeutic drug level monitoring: Secondary | ICD-10-CM | POA: Diagnosis not present

## 2017-02-02 LAB — POCT INR: INR: 2.2

## 2017-02-02 NOTE — Patient Instructions (Addendum)
Medication Instructions:   Your physician recommends that you continue on your current medications as directed. Please refer to the Current Medication list given to you today.    If you need a refill on your cardiac medications before your next appointment, please call your pharmacy.  Labwork: NONE ORDERED  TODAY    Testing/Procedures: NONE ORDERED  TODAY    Follow-Up:  Your physician wants you to follow-up in:  IN  [place] will receive a reminder letter in the mail two months in advance. If you don't receive a letter, please call our office to schedule the follow-up appointment.   Remote monitoring is used to monitor your Pacemaker of ICD from home. This monitoring reduces the number of office visits required to check your device to one time per year. It allows [place] to keep an eye on the functioning of your device to ensure it is working properly. You are scheduled for a device check from home on . 1-17-19You may send your transmission at any time that day. If you have a wireless device, the transmission will be sent automatically. After your physician reviews your transmission, you will receive a postcard with your next transmission date.     Any Other Special Instructions Will Be Listed Below (If Applicable).

## 2017-02-02 NOTE — Patient Instructions (Signed)
Description   Continue taking 1 tablet daily except 1/2 tablet on Tuesdays. Recheck in 6 weeks. Call us with any medication changes or concerns # [phone].

## 2017-02-10 ENCOUNTER — Telehealth: Payer: Self-pay | Admitting: Internal Medicine

## 2017-02-10 MED ORDER — HYDROCODONE-ACETAMINOPHEN 10-325 MG PO TABS: 1.0000 | ORAL_TABLET | Freq: Every day | ORAL | 0 refills | Status: DC | PRN

## 2017-02-10 NOTE — Telephone Encounter (Signed)
Done erx 

## 2017-02-10 NOTE — Telephone Encounter (Signed)
Copied from [employer] [id number]. Topic: Quick Communication - Rx Refill/Question >> Feb 10, 2017  2:31 PM Ether Griffins B wrote: Has the patient contacted their pharmacy? No. Must pick up    (Agent: If no, request that the patient contact the pharmacy for the refill.)   Preferred Pharmacy (with phone number or street name): [facility]   Agent: Please be advised that RX refills may take up to 3 business days. We ask that you follow-up with your pharmacy.

## 2017-02-10 NOTE — Telephone Encounter (Signed)
what medication pt is needing a refill on...Jonathan Orozco

## 2017-02-10 NOTE — Telephone Encounter (Signed)
Pt is requesting refill on his Hydrocodone.10/325 mg...Jonathan Orozco

## 2017-02-11 NOTE — Telephone Encounter (Signed)
Notified pt rx has been sent to your local pharmacy.../lmb  

## 2017-02-19 ENCOUNTER — Ambulatory Visit (INDEPENDENT_AMBULATORY_CARE_PROVIDER_SITE_OTHER): Payer: Medicare Other | Admitting: *Deleted

## 2017-02-19 DIAGNOSIS — R001 Bradycardia, unspecified: Secondary | ICD-10-CM | POA: Diagnosis not present

## 2017-02-19 NOTE — Progress Notes (Signed)
Remote pacemaker transmission.   

## 2017-02-20 ENCOUNTER — Encounter: Payer: Self-pay | Admitting: Cardiology

## 2017-03-05 LAB — CUP PACEART REMOTE DEVICE CHECK
Battery Remaining Longevity: 143 mo
Battery Voltage: 3.07 V
Implantable Lead Implant Date: 20180430
Implantable Pulse Generator Implant Date: 20180430
Lead Channel Impedance Value: 399 Ohm
Lead Channel Pacing Threshold Amplitude: 0.875 V
Lead Channel Pacing Threshold Pulse Width: 0.4 ms
Lead Channel Sensing Intrinsic Amplitude: 6.875 mV
Lead Channel Sensing Intrinsic Amplitude: 7.25 mV
Lead Channel Setting Pacing Pulse Width: 0.4 ms
Lead Channel Setting Sensing Sensitivity: 1.2 mV
MDC IDC LEAD LOCATION: 753860
MDC IDC MSMT LEADCHNL RV IMPEDANCE VALUE: 608 Ohm
MDC IDC SESS DTM: 20190117041940
MDC IDC SET LEADCHNL RV PACING AMPLITUDE: 2.5 V
MDC IDC STAT BRADY RV PERCENT PACED: 99.9 %

## 2017-03-06 ENCOUNTER — Telehealth: Payer: Self-pay | Admitting: Internal Medicine

## 2017-03-06 NOTE — Telephone Encounter (Unsigned)
Copied from [place]. Topic: Quick Communication - Rx Refill/Question >> Mar 06, 2017 11:07 AM Percell Belt A wrote: Medication: HYDROcodone-acetaminophen (NORCO) 10-325 MG tablet [[id number]]   Has the patient contacted their pharmacy? No    (Agent: If no, request that the patient contact the pharmacy for the refill.)   Preferred Pharmacy (with phone number or street name): [facility] on [address]   Agent: Please be advised that RX refills may take up to 3 business days. We ask that you follow-up with your pharmacy.

## 2017-03-14 ENCOUNTER — Other Ambulatory Visit: Payer: Self-pay | Admitting: Internal Medicine

## 2017-03-16 ENCOUNTER — Ambulatory Visit (INDEPENDENT_AMBULATORY_CARE_PROVIDER_SITE_OTHER): Payer: Medicare Other | Admitting: *Deleted

## 2017-03-16 DIAGNOSIS — Z5181 Encounter for therapeutic drug level monitoring: Secondary | ICD-10-CM

## 2017-03-16 DIAGNOSIS — I4891 Unspecified atrial fibrillation: Secondary | ICD-10-CM

## 2017-03-16 LAB — POCT INR: INR: 2.5

## 2017-03-16 MED ORDER — HYDROCODONE-ACETAMINOPHEN 10-325 MG PO TABS: 1.0000 | ORAL_TABLET | Freq: Every day | ORAL | 0 refills | Status: DC | PRN

## 2017-03-16 NOTE — Patient Instructions (Signed)
Description   Continue taking 1 tablet daily except 1/2 tablet on Tuesdays. Recheck in 6 weeks. Call us with any medication changes or concerns # [phone].

## 2017-03-16 NOTE — Telephone Encounter (Signed)
Done erx 

## 2017-03-26 ENCOUNTER — Ambulatory Visit: Payer: Medicare Other | Admitting: Neurology

## 2017-04-08 NOTE — Telephone Encounter (Signed)
Error

## 2017-04-13 ENCOUNTER — Other Ambulatory Visit: Payer: Self-pay | Admitting: Internal Medicine

## 2017-04-13 ENCOUNTER — Telehealth: Payer: Self-pay | Admitting: Internal Medicine

## 2017-04-13 MED ORDER — HYDROCODONE-ACETAMINOPHEN 10-325 MG PO TABS: 1.0000 | ORAL_TABLET | Freq: Every day | ORAL | 0 refills | Status: DC | PRN

## 2017-04-13 NOTE — Telephone Encounter (Signed)
Ok for "osteoarthritis'

## 2017-04-13 NOTE — Telephone Encounter (Signed)
Done erx 

## 2017-04-13 NOTE — Telephone Encounter (Signed)
Copied from [place] [id number]. Topic: Quick Communication - See Telephone Encounter >> Apr 13, 2017  1:09 PM Margot Ables wrote: CRM for notification. See Telephone encounter for: 04/13/17.  Needs DX for hydrocodone RX

## 2017-04-13 NOTE — Telephone Encounter (Signed)
Dx provided to pharmacist

## 2017-04-28 ENCOUNTER — Ambulatory Visit (INDEPENDENT_AMBULATORY_CARE_PROVIDER_SITE_OTHER): Payer: Medicare Other | Admitting: *Deleted

## 2017-04-28 DIAGNOSIS — Z5181 Encounter for therapeutic drug level monitoring: Secondary | ICD-10-CM

## 2017-04-28 DIAGNOSIS — I4891 Unspecified atrial fibrillation: Secondary | ICD-10-CM | POA: Diagnosis not present

## 2017-04-28 LAB — POCT INR: INR: 3.4

## 2017-04-28 NOTE — Patient Instructions (Signed)
Description   Skip today's dose, then Continue taking 1 tablet daily except 1/2 tablet on Tuesdays. Recheck in 2 weeks. Call us with any medication changes or concerns # [phone].

## 2017-05-12 ENCOUNTER — Ambulatory Visit (INDEPENDENT_AMBULATORY_CARE_PROVIDER_SITE_OTHER): Payer: Medicare Other | Admitting: *Deleted

## 2017-05-12 DIAGNOSIS — Z5181 Encounter for therapeutic drug level monitoring: Secondary | ICD-10-CM | POA: Diagnosis not present

## 2017-05-12 DIAGNOSIS — I4891 Unspecified atrial fibrillation: Secondary | ICD-10-CM

## 2017-05-12 LAB — POCT INR: INR: 2.2

## 2017-05-12 NOTE — Patient Instructions (Signed)
Description   Continue taking 1 tablet daily except 1/2 tablet on Tuesdays. Recheck in 4 weeks. Call us with any medication changes or concerns # [phone].

## 2017-05-14 ENCOUNTER — Other Ambulatory Visit: Payer: Self-pay | Admitting: Internal Medicine

## 2017-05-14 MED ORDER — HYDROCODONE-ACETAMINOPHEN 10-325 MG PO TABS
1.0000 | ORAL_TABLET | Freq: Every day | ORAL | 0 refills | Status: DC | PRN
Start: 2017-05-14 — End: 2017-06-13

## 2017-05-14 NOTE — Telephone Encounter (Signed)
Done erx 

## 2017-05-14 NOTE — Telephone Encounter (Signed)
04/13/2017 30# 

## 2017-05-21 ENCOUNTER — Ambulatory Visit (INDEPENDENT_AMBULATORY_CARE_PROVIDER_SITE_OTHER): Payer: Medicare Other | Admitting: *Deleted

## 2017-05-21 DIAGNOSIS — R001 Bradycardia, unspecified: Secondary | ICD-10-CM

## 2017-05-22 ENCOUNTER — Encounter: Payer: Self-pay | Admitting: Cardiology

## 2017-05-22 NOTE — Progress Notes (Signed)
Remote pacemaker transmission.   

## 2017-05-26 LAB — CUP PACEART REMOTE DEVICE CHECK
Brady Statistic RV Percent Paced: 99.91 %
Date Time Interrogation Session: 20190418173715
Implantable Lead Implant Date: 20180430
Lead Channel Impedance Value: 380 Ohm
Lead Channel Impedance Value: 570 Ohm
Lead Channel Sensing Intrinsic Amplitude: 6.875 mV
Lead Channel Setting Pacing Amplitude: 2.5 V
MDC IDC LEAD LOCATION: 753860
MDC IDC MSMT BATTERY REMAINING LONGEVITY: 137 mo
MDC IDC MSMT BATTERY VOLTAGE: 3.05 V
MDC IDC MSMT LEADCHNL RV PACING THRESHOLD AMPLITUDE: 0.875 V
MDC IDC MSMT LEADCHNL RV PACING THRESHOLD PULSEWIDTH: 0.4 ms
MDC IDC MSMT LEADCHNL RV SENSING INTR AMPL: 7.25 mV
MDC IDC PG IMPLANT DT: 20180430
MDC IDC SET LEADCHNL RV PACING PULSEWIDTH: 0.4 ms
MDC IDC SET LEADCHNL RV SENSING SENSITIVITY: 1.2 mV

## 2017-06-09 ENCOUNTER — Ambulatory Visit (INDEPENDENT_AMBULATORY_CARE_PROVIDER_SITE_OTHER): Payer: Medicare Other | Admitting: *Deleted

## 2017-06-09 DIAGNOSIS — Z5181 Encounter for therapeutic drug level monitoring: Secondary | ICD-10-CM | POA: Diagnosis not present

## 2017-06-09 DIAGNOSIS — I4891 Unspecified atrial fibrillation: Secondary | ICD-10-CM | POA: Diagnosis not present

## 2017-06-09 LAB — POCT INR: INR: 2.5

## 2017-06-09 NOTE — Patient Instructions (Signed)
Description   Continue taking 1 tablet daily except 1/2 tablet on Tuesdays. Recheck in 5 weeks. Call us with any medication changes or concerns # [phone].

## 2017-06-13 ENCOUNTER — Other Ambulatory Visit: Payer: Self-pay | Admitting: Internal Medicine

## 2017-06-15 MED ORDER — HYDROCODONE-ACETAMINOPHEN 10-325 MG PO TABS: 1.0000 | ORAL_TABLET | Freq: Every day | ORAL | 0 refills | Status: DC | PRN

## 2017-06-15 NOTE — Telephone Encounter (Signed)
Done erx 

## 2017-06-15 NOTE — Telephone Encounter (Signed)
05/14/2017 30# 

## 2017-06-30 ENCOUNTER — Encounter: Payer: Self-pay | Admitting: Internal Medicine

## 2017-06-30 ENCOUNTER — Other Ambulatory Visit (INDEPENDENT_AMBULATORY_CARE_PROVIDER_SITE_OTHER): Payer: Medicare Other

## 2017-06-30 ENCOUNTER — Ambulatory Visit (INDEPENDENT_AMBULATORY_CARE_PROVIDER_SITE_OTHER): Payer: Medicare Other | Admitting: Internal Medicine

## 2017-06-30 VITALS — BP 122/78 | HR 90 | Temp 98.0°F | Ht 70.0 in | Wt 170.0 lb

## 2017-06-30 DIAGNOSIS — R739 Hyperglycemia, unspecified: Secondary | ICD-10-CM

## 2017-06-30 DIAGNOSIS — Z0000 Encounter for general adult medical examination without abnormal findings: Secondary | ICD-10-CM | POA: Diagnosis not present

## 2017-06-30 DIAGNOSIS — K589 Irritable bowel syndrome without diarrhea: Secondary | ICD-10-CM | POA: Insufficient documentation

## 2017-06-30 LAB — CBC WITH DIFFERENTIAL/PLATELET
BASOS PCT: 0.4 % (ref 0.0–3.0)
Basophils Absolute: 0 10*3/uL (ref 0.0–0.1)
EOS ABS: 0 10*3/uL (ref 0.0–0.7)
Eosinophils Relative: 0.6 % (ref 0.0–5.0)
HCT: 42.5 % (ref 39.0–52.0)
HEMOGLOBIN: 14.4 g/dL (ref 13.0–17.0)
LYMPHS ABS: 3.2 10*3/uL (ref 0.7–4.0)
Lymphocytes Relative: 49 % — ABNORMAL HIGH (ref 12.0–46.0)
MCHC: 33.9 g/dL (ref 30.0–36.0)
MCV: 94.9 fl (ref 78.0–100.0)
MONO ABS: 0.6 10*3/uL (ref 0.1–1.0)
Monocytes Relative: 8.6 % (ref 3.0–12.0)
NEUTROS PCT: 41.4 % — AB (ref 43.0–77.0)
Neutro Abs: 2.7 10*3/uL (ref 1.4–7.7)
Platelets: 212 10*3/uL (ref 150.0–400.0)
RBC: 4.48 Mil/uL (ref 4.22–5.81)
RDW: 13.4 % (ref 11.5–15.5)
WBC: 6.6 10*3/uL (ref 4.0–10.5)

## 2017-06-30 LAB — HEPATIC FUNCTION PANEL
ALT: 28 U/L (ref 0–53)
AST: 27 U/L (ref 0–37)
Albumin: 4.3 g/dL (ref 3.5–5.2)
Alkaline Phosphatase: 96 U/L (ref 39–117)
BILIRUBIN DIRECT: 0.3 mg/dL (ref 0.0–0.3)
BILIRUBIN TOTAL: 1.3 mg/dL — AB (ref 0.2–1.2)
Total Protein: 7.1 g/dL (ref 6.0–8.3)

## 2017-06-30 LAB — BASIC METABOLIC PANEL
BUN: 13 mg/dL (ref 6–23)
CO2: 28 meq/L (ref 19–32)
Calcium: 9.5 mg/dL (ref 8.4–10.5)
Chloride: 100 mEq/L (ref 96–112)
Creatinine, Ser: 0.76 mg/dL (ref 0.40–1.50)
GFR: 103.02 mL/min (ref >60.00)
GLUCOSE: 98 mg/dL (ref 70–99)
Potassium: 4.4 mEq/L (ref 3.5–5.1)
Sodium: 136 mEq/L (ref 135–145)

## 2017-06-30 LAB — URINALYSIS, ROUTINE W REFLEX MICROSCOPIC
BILIRUBIN URINE: NEGATIVE
Hgb urine dipstick: NEGATIVE
KETONES UR: 15 — AB
LEUKOCYTES UA: NEGATIVE
NITRITE: NEGATIVE
RBC / HPF: NONE SEEN (ref 0–?)
Specific Gravity, Urine: 1.025 (ref 1.000–1.030)
Total Protein, Urine: NEGATIVE
UROBILINOGEN UA: 0.2 (ref 0.0–1.0)
Urine Glucose: NEGATIVE
pH: 5.5 (ref 5.0–8.0)

## 2017-06-30 LAB — LIPID PANEL
CHOL/HDL RATIO: 2
Cholesterol: 113 mg/dL (ref 0–200)
HDL: 50.3 mg/dL (ref >39.00)
LDL CALC: 54 mg/dL (ref 0–99)
NONHDL: 62.67
Triglycerides: 43 mg/dL (ref 0.0–149.0)
VLDL: 8.6 mg/dL (ref 0.0–40.0)

## 2017-06-30 LAB — HEMOGLOBIN A1C: HEMOGLOBIN A1C: 5.8 % (ref 4.6–6.5)

## 2017-06-30 MED ORDER — ATORVASTATIN CALCIUM 10 MG PO TABS: 10.0000 mg | ORAL_TABLET | Freq: Every day | ORAL | 3 refills | Status: DC

## 2017-06-30 MED ORDER — TAMSULOSIN HCL 0.4 MG PO CAPS: 0.4000 mg | ORAL_CAPSULE | Freq: Every day | ORAL | 3 refills | Status: DC

## 2017-06-30 NOTE — Progress Notes (Signed)
Subjective:    Patient ID: Jonathan Orozco, male    DOB: 1928/04/19, 84 y.o.   MRN: [id number]  HPI  Here for wellness and f/u;  Overall doing ok;  Pt denies Chest pain, worsening SOB, DOE, wheezing, orthopnea, PND, worsening LE edema, palpitations, dizziness or syncope.  Pt denies neurological change such as new headache, facial or extremity weakness.  Pt denies polydipsia, polyuria, or low sugar symptoms. Pt states overall good compliance with treatment and medications, good tolerability, and has been trying to follow appropriate diet.  Pt denies worsening depressive symptoms, suicidal ideation or panic. No fever, night sweats, loss of appetite, or other constitutional symptoms.  Pt states good ability with ADL's, has low fall risk, home safety reviewed and adequate, no other significant changes in hearing or vision, and only occasionally active with exercise. Lost wt with better diet.   Wt Readings from Last 3 Encounters:  06/30/17 170 lb (77.1 kg)  02/02/17 176 lb (79.8 kg)  12/23/16 175 lb (79.4 kg)  Taking CBD twice daily, and feels less nervous. No other new complaints or interval change Past Medical History:  Diagnosis Date  . ABUSE, ALCOHOL, IN REMISSION 04/08/2007  . ALLERGIC RHINITIS 09/29/2006  . Arthritis    "hands" (07/02/2016)  . Atrial fibrillation ([place])   . Atrial fibrillation with RVR ([place]) 07/02/2016  . Atrial flutter ([place]) 03/28/2010   a. s/p prior rfca;  b. recurrent paroxysmal flutter 04/2012;  c. pradaxa initiated 04/2012.  [name] BENIGN PROSTATIC HYPERTROPHY 09/29/2006  . BPPV (benign paroxysmal positional vertigo) 04/08/2007  . CAD (coronary artery disease)    a. LHC 2/17: EF 55-65%, LM 75, pLAD 75, oD1 75, oD2 65, D3 85, oLCx 99, oOM1 75, pRCA 25 >> CABG  . Carotid stenosis    a. Carotid US 2/17:  Bilateral ICA 1-39% ICA  . Chronic lower back pain   . COLONIC POLYPS, HX OF 06/23/2007  . 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 Diverticulosis   . ERECTILE DYSFUNCTION  09/29/2006  . GERD (gastroesophageal reflux disease)   . GLUCOSE INTOLERANCE 03/19/2010  . HYPERLIPIDEMIA 09/29/2006  . HYPERTENSION 09/29/2006  . Long term (current) use of anticoagulants 04/26/2010  . MELANOMA, MALIGNANT, SKIN NOS 09/29/2006   other skin cancers -no further melanoma  . OSTEOARTHROSIS NOS, OTHER [facility] SITE 09/29/2006  . Other specified forms of hearing loss 08/10/2009  . Pneumonia ~ 1969  . Presence of permanent cardiac pacemaker 06/02/2016  . VENTRICULAR TACHYCARDIA 09/29/2006   Past Surgical History:  Procedure Laterality Date  . AV NODE ABLATION  07/02/2016  . AV NODE ABLATION N/A 07/02/2016   Procedure: AV Node Ablation;  Surgeon: Deboraha Sprang, MD;  Location: [place] [facility];  Service: Cardiovascular;  Laterality: N/A;  . CARDIAC CATHETERIZATION N/A 03/12/2015   Procedure: Left Heart Cath and Coronary Angiography;  Surgeon: [name], MD;  Location: [place] [facility];  Service: Cardiovascular;  Laterality: N/A;  . CARDIOVERSION N/A 12/19/2015   Procedure: CARDIOVERSION;  Surgeon: [name], MD;  Location: [facility]OS[facility];  Service: Cardiovascular;  Laterality: N/A;  . CARDIOVERSION N/A 03/24/2016   Procedure: CARDIOVERSION;  Surgeon: [name], MD;  Location: [place];  Service: Cardiovascular;  Laterality: N/A;  . CATARACT EXTRACTION W/ INTRAOCULAR LENS  IMPLANT, BILATERAL Bilateral   . CLIPPING OF ATRIAL APPENDAGE N/A 03/14/2015   Procedure: CLIPPING OF ATRIAL APPENDAGE;  Surgeon: [name], MD;  Location: [place];  Service: Open Heart Surgery;  Laterality:  N/A;  . COLONOSCOPY WITH PROPOFOL N/A 12/08/2014   Procedure: COLONOSCOPY WITH PROPOFOL;  Surgeon: [name], MD;  Location: [facility] ENDOSCOPY;  Service: Endoscopy;  Laterality: N/A;  . CORONARY ARTERY BYPASS GRAFT N/A 03/14/2015   Procedure: CORONARY ARTERY BYPASS GRAFTING (CABG) x 4 (LIMA to LAD, SVG to DIAGONAL 2, SVG SEQUENTIALLY to OM1 and OM2);  Surgeon: [name], MD;  Location:  [facility];  Service: Open Heart Surgery;  Laterality: N/A;  . INCISION AND DRAINAGE N/A 07/03/2016   Procedure: INCISION AND DRAINAGE of CHEST ABSCESS;  Surgeon: [name], MD;  Location: [place];  Service: Thoracic;  Laterality: N/A;  . INSERT / REPLACE / REMOVE PACEMAKER  06/02/2016  . JOINT REPLACEMENT    . KNEE ARTHROSCOPY Left   . MELANOMA EXCISION     "upper back"  . PACEMAKER IMPLANT N/A 06/02/2016   Procedure: Pacemaker Implant;  Surgeon: Deboraha Sprang, MD;  Location: [place] [facility];  Service: Cardiovascular;  Laterality: N/A;  . SHOULDER OPEN ROTATOR CUFF REPAIR Left   . SKIN CANCER EXCISION Right    "cheek; real deep"  . TEE WITHOUT CARDIOVERSION N/A 03/14/2015   Procedure: TRANSESOPHAGEAL ECHOCARDIOGRAM (TEE);  Surgeon: [name], MD;  Location: [place];  Service: Open Heart Surgery;  Laterality: N/A;  . TEE WITHOUT CARDIOVERSION N/A 12/19/2015   Procedure: TRANSESOPHAGEAL ECHOCARDIOGRAM (TEE);  Surgeon: [name], MD;  Location: [place];  Service: Cardiovascular;  Laterality: N/A;  . TEE WITHOUT CARDIOVERSION N/A 03/24/2016   Procedure: TRANSESOPHAGEAL ECHOCARDIOGRAM (TEE);  Surgeon: [name], MD;  Location: [place];  Service: Cardiovascular;  Laterality: N/A;  . TONSILLECTOMY    . TOTAL KNEE ARTHROPLASTY Right     reports that he has quit smoking. His smoking use included cigarettes. He has a 52.00 pack-year smoking history. He has never used smokeless tobacco. He reports that he does not drink alcohol or use drugs. family history includes Diabetes in his brother; Esophageal cancer in his father. Allergies  Allergen Reactions  . Amiodarone Hcl Other (See Comments)    Patient reports photosensitivity  . Ace Inhibitors Cough   Current Outpatient Medications on File Prior to Visit  Medication Sig Dispense Refill  . doxylamine, Sleep, (UNISOM) 25 MG tablet Take 25 mg by mouth at bedtime.    . fish oil-omega-3 fatty acids 1000 MG capsule  Take 1 g by mouth every evening.     [name] HYDROcodone-acetaminophen (NORCO) 10-325 MG tablet Take 1 tablet by mouth daily as needed. 30 tablet 0  . Multiple Vitamin (MULTIVITAMIN) tablet Take 1 tablet by mouth daily.      . Psyllium (METAMUCIL PO) 1 TABLESPOON IN A GLASS OF WATER ONCE DAILY    . warfarin (COUMADIN) 5 MG tablet Take as directed by Coumadin clinic. 100 tablet 1  . ZINC OXIDE EX Apply 1 application topically as needed (itching).     No current facility-administered medications on file prior to visit.    Review of Systems Constitutional: Negative for other unusual diaphoresis, sweats, appetite or weight changes HENT: Negative for other worsening hearing loss, ear pain, facial swelling, mouth sores or neck stiffness.   Eyes: Negative for other worsening pain, redness or other visual disturbance.  Respiratory: Negative for other stridor or swelling Cardiovascular: Negative for other palpitations or other chest pain  Gastrointestinal: Negative for worsening diarrhea or loose stools, blood in stool, distention or other pain Genitourinary: Negative for hematuria, flank pain or other change in urine volume.  Musculoskeletal: Negative for myalgias or other joint swelling.  Skin: Negative for other color change, or other wound or worsening drainage.  Neurological: Negative for other syncope or numbness. Hematological: Negative for other adenopathy or swelling Psychiatric/Behavioral: Negative for hallucinations, other worsening agitation, SI, self-injury, or new decreased concentration All other system neg per pt    Objective:   Physical Exam BP 122/78   Pulse 90   Temp 98 F (36.7 C) (Oral)   Ht 5\' 10"  (1.778 m)   Wt 170 lb (77.1 kg)   SpO2 97%   BMI 24.39 kg/m  VS noted,  Constitutional: Pt is oriented to person, place, and time. Appears well-developed and well-nourished, in no significant distress and comfortable Head: Normocephalic and atraumatic  Eyes: Conjunctivae and EOM  are normal. Pupils are equal, round, and reactive to light Right Ear: External ear normal without discharge Left Ear: External ear normal without discharge Nose: Nose without discharge or deformity Mouth/Throat: Oropharynx is without other ulcerations and moist  Neck: Normal range of motion. Neck supple. No JVD present. No tracheal deviation present or significant neck LA or mass Cardiovascular: Normal rate, regular rhythm, normal heart sounds and intact distal pulses.   Pulmonary/Chest: WOB normal and breath sounds without rales or wheezing  Abdominal: Soft. Bowel sounds are normal. NT. No HSM  Musculoskeletal: Normal range of motion. Exhibits no edema Lymphadenopathy: Has no other cervical adenopathy.  Neurological: Pt is alert and oriented to person, place, and time. Pt has normal reflexes. No cranial nerve deficit. Motor grossly intact, Gait intact Skin: Skin is warm and dry. No rash noted or new ulcerations Psychiatric:  Has normal mood and affect. Behavior is normal without agitation No other exam findings     Assessment & Plan:

## 2017-06-30 NOTE — Patient Instructions (Signed)

## 2017-07-01 ENCOUNTER — Ambulatory Visit (INDEPENDENT_AMBULATORY_CARE_PROVIDER_SITE_OTHER): Payer: Medicare Other | Admitting: Neurology

## 2017-07-01 ENCOUNTER — Encounter: Payer: Self-pay | Admitting: Neurology

## 2017-07-01 VITALS — BP 124/62 | HR 79 | Ht 70.0 in | Wt 168.0 lb

## 2017-07-01 DIAGNOSIS — R2681 Unsteadiness on feet: Secondary | ICD-10-CM

## 2017-07-01 LAB — TSH: TSH: 0.46 u[IU]/mL (ref 0.35–4.50)

## 2017-07-01 NOTE — Progress Notes (Signed)
NEUROLOGY FOLLOW UP OFFICE NOTE  Jonathan Orozco [id number]  HISTORY OF PRESENT ILLNESS: Jonathan Orozco is an 84 year old right-handed male with paroxysmal atrial fibrillation status post pacemaker, CAD status post CABG, hypertension, hyperlipidemia, osteoarthrosis and history of melanoma and alcohol abuse in remission who follows up for unsteady gait.  UPDATE: He underwent PT in November and December.  It was effective but he stopped performing the exercises at home.  He still walks on the treadmill and uses weights.  He has not had any falls.   HISTORY: He has history of chronic low back pain going back over 10 years.  MRI of lumbar spine from 12/07/05 demonstrated multilevel degenerative disc and facet disease.  Repeat MRI of lumbar spine from 06/30/09 showed some progression of his degenerative disc disease, with severe left lateral recess stenosis and moderate spinal stenosis at L3-L4, moderate left lateral recess stenosis at L4-L5 and moderate bilateral foraminal stenosis at L5-S1.     Since last year, he has felt more unsteady on his feet.  It occurs with quick movements where he is maneuvering, such as turning around quickly or with his eyes closed.  He denies lightheadedness, vertigo, numbness and tingling in feet, or lower extremity weakness.  He just loses balance.  He has had falls but has caught himself.  He denies neck pain.  PAST MEDICAL HISTORY: Past Medical History:  Diagnosis Date  . ABUSE, ALCOHOL, IN REMISSION 04/08/2007  . ALLERGIC RHINITIS 09/29/2006  . Arthritis    "hands" (07/02/2016)  . Atrial fibrillation ([place])   . Atrial fibrillation with RVR ([place]) 07/02/2016  . Atrial flutter ([place]) 03/28/2010   a. s/p prior rfca;  b. recurrent paroxysmal flutter 04/2012;  c. pradaxa initiated 04/2012.  [name] BENIGN PROSTATIC HYPERTROPHY 09/29/2006  . BPPV (benign paroxysmal positional vertigo) 04/08/2007  . CAD (coronary artery disease)    a. LHC 2/17: EF 55-65%, LM 75, pLAD 75, oD1 75, oD2  65, D3 85, oLCx 99, oOM1 75, pRCA 25 >> CABG  . Carotid stenosis    a. Carotid US 2/17:  Bilateral ICA 1-39% ICA  . Chronic lower back pain   . COLONIC POLYPS, HX OF 06/23/2007  . 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 Diverticulosis   . ERECTILE DYSFUNCTION 09/29/2006  . GERD (gastroesophageal reflux disease)   . GLUCOSE INTOLERANCE 03/19/2010  . HYPERLIPIDEMIA 09/29/2006  . HYPERTENSION 09/29/2006  . Long term (current) use of anticoagulants 04/26/2010  . MELANOMA, MALIGNANT, SKIN NOS 09/29/2006   other skin cancers -no further melanoma  . OSTEOARTHROSIS NOS, OTHER [facility] SITE 09/29/2006  . Other specified forms of hearing loss 08/10/2009  . Pneumonia ~ 1969  . Presence of permanent cardiac pacemaker 06/02/2016  . VENTRICULAR TACHYCARDIA 09/29/2006    MEDICATIONS: Current Outpatient Medications on File Prior to Visit  Medication Sig Dispense Refill  . atorvastatin (LIPITOR) 10 MG tablet Take 1 tablet (10 mg total) by mouth daily. 90 tablet 3  . doxylamine, Sleep, (UNISOM) 25 MG tablet Take 25 mg by mouth at bedtime.    . fish oil-omega-3 fatty acids 1000 MG capsule Take 1 g by mouth every evening.     [name] HYDROcodone-acetaminophen (NORCO) 10-325 MG tablet Take 1 tablet by mouth daily as needed. 30 tablet 0  . Multiple Vitamin (MULTIVITAMIN) tablet Take 1 tablet by mouth daily.      . Psyllium (METAMUCIL PO) 1 TABLESPOON IN A GLASS OF WATER ONCE DAILY    . tamsulosin (FLOMAX) 0.4 MG  CAPS capsule Take 1 capsule (0.4 mg total) by mouth daily. 90 capsule 3  . warfarin (COUMADIN) 5 MG tablet Take as directed by Coumadin clinic. 100 tablet 1  . ZINC OXIDE EX Apply 1 application topically as needed (itching).     No current facility-administered medications on file prior to visit.     ALLERGIES: Allergies  Allergen Reactions  . Amiodarone Hcl Other (See Comments)    Patient reports photosensitivity  . Ace Inhibitors Cough    FAMILY HISTORY: Family History  Problem Relation Age of  Onset  . Diabetes Brother   . Esophageal cancer Father   . Colon cancer Neg Hx   . Stomach cancer Neg Hx     SOCIAL HISTORY: Social History   Socioeconomic History  . Marital status: Married    Spouse name: Not on file  . Number of children: Not on file  . Years of education: Not on file  . Highest education level: Not on file  Occupational History  . Occupation: Retired, Geographical information systems officer  . Financial resource strain: Not on file  . Food insecurity:    Worry: Not on file    Inability: Not on file  . Transportation needs:    Medical: Not on file    Non-medical: Not on file  Tobacco Use  . Smoking status: Former Smoker    Packs/day: 1.00    Years: 52.00    Pack years: 52.00    Types: Cigarettes  . Smokeless tobacco: Never Used  . Tobacco comment: 07/02/2016 "quit before 2000"  Substance and Sexual Activity  . Alcohol use: No    Frequency: Never    Comment: 07/02/2016 "quit before 2000"  . Drug use: No  . Sexual activity: Never  Lifestyle  . Physical activity:    Days per week: Not on file    Minutes per session: Not on file  . Stress: Not on file  Relationships  . Social connections:    Talks on phone: Not on file    Gets together: Not on file    Attends religious service: Not on file    Active member of club or organization: Not on file    Attends meetings of clubs or organizations: Not on file    Relationship status: Not on file  . Intimate partner violence:    Fear of current or ex partner: Not on file    Emotionally abused: Not on file    Physically abused: Not on file    Forced sexual activity: Not on file  Other Topics Concern  . Not on file  Social History Narrative   Married, 3 children. Retired Press photographer. Lives in [place] with  wife and kids. He is retired from Press photographer.     REVIEW OF SYSTEMS: Constitutional: No fevers, chills, or sweats, no generalized fatigue, change in appetite Eyes: No visual changes, double vision, eye pain Ear, nose and throat: No  hearing loss, ear pain, nasal congestion, sore throat Cardiovascular: No chest pain, palpitations Respiratory:  No shortness of breath at rest or with exertion, wheezes GastrointestinaI: No nausea, vomiting, diarrhea, abdominal pain, fecal incontinence Genitourinary:  No dysuria, urinary retention or frequency Musculoskeletal:  No neck pain, back pain Integumentary: No rash, pruritus, skin lesions Neurological: as above Psychiatric: No depression, insomnia, anxiety Endocrine: No palpitations, fatigue, diaphoresis, mood swings, change in appetite, change in weight, increased thirst Hematologic/Lymphatic:  No purpura, petechiae. Allergic/Immunologic: no itchy/runny eyes, nasal congestion, recent allergic reactions, rashes  PHYSICAL EXAM: Vitals:  07/01/17 1410  BP: 124/62  Pulse: 79  SpO2: 98%   General: No acute distress.  Patient appears well-groomed.   Head:  Normocephalic/atraumatic Eyes:  Fundi examined but not visualized Neurological Exam: alert and oriented to person, place, and time. Attention span and concentration intact, recent and remote memory intact, fund of knowledge intact.  Speech fluent and not dysarthric, language intact.  CN II-XII intact. Bulk and tone normal, muscle strength 5/5 throughout.  Sensation to light touch, temperature and vibration reduced in feet.  Deep tendon reflexes absent throughout, toes downgoing.  Finger to nose testing intact.  Gait normal stride but mildly unsteady once or twice, able to turn in 4 steps, Romberg negative.  IMPRESSION: Unsteady gait.  May be related to underlying peripheral neuropathy.  No exam findings to suggest CNS etiology.  PLAN: Continue balance and gait exercises Follow up as needed.  15 minutes spent face to face with patient, over 50% spent discussing management.  Metta Clines, DO  CC:  Dr. Jenny Reichmann

## 2017-07-05 NOTE — Assessment & Plan Note (Signed)

## 2017-07-05 NOTE — Assessment & Plan Note (Signed)
Lab Results  Component Value Date   HGBA1C 5.8 06/30/2017  stable overall by history and exam, recent data reviewed with pt, and pt to continue medical treatment as before,  to f/u any worsening symptoms or concerns

## 2017-07-14 ENCOUNTER — Ambulatory Visit (INDEPENDENT_AMBULATORY_CARE_PROVIDER_SITE_OTHER): Payer: Medicare Other | Admitting: *Deleted

## 2017-07-14 DIAGNOSIS — I4891 Unspecified atrial fibrillation: Secondary | ICD-10-CM | POA: Diagnosis not present

## 2017-07-14 DIAGNOSIS — Z5181 Encounter for therapeutic drug level monitoring: Secondary | ICD-10-CM

## 2017-07-14 LAB — POCT INR: INR: 2.4 (ref 2.0–3.0)

## 2017-07-14 NOTE — Patient Instructions (Signed)
Description   Continue taking 1 tablet daily except 1/2 tablet on Tuesdays. Recheck in 6 weeks. Call us with any medication changes or concerns # [phone].

## 2017-07-16 ENCOUNTER — Other Ambulatory Visit: Payer: Self-pay | Admitting: Internal Medicine

## 2017-07-16 MED ORDER — HYDROCODONE-ACETAMINOPHEN 10-325 MG PO TABS: 1.0000 | ORAL_TABLET | Freq: Every day | ORAL | 0 refills | Status: DC | PRN

## 2017-07-16 NOTE — Telephone Encounter (Signed)
Done erx 

## 2017-07-16 NOTE — Telephone Encounter (Signed)
06/16/2017 30# 

## 2017-08-04 ENCOUNTER — Encounter: Payer: Self-pay | Admitting: Internal Medicine

## 2017-08-04 ENCOUNTER — Ambulatory Visit (INDEPENDENT_AMBULATORY_CARE_PROVIDER_SITE_OTHER): Payer: Medicare Other | Admitting: Internal Medicine

## 2017-08-04 VITALS — BP 112/62 | HR 71 | Ht 72.0 in | Wt 171.0 lb

## 2017-08-04 DIAGNOSIS — I4891 Unspecified atrial fibrillation: Secondary | ICD-10-CM

## 2017-08-04 DIAGNOSIS — R001 Bradycardia, unspecified: Secondary | ICD-10-CM | POA: Diagnosis not present

## 2017-08-04 DIAGNOSIS — I2510 Atherosclerotic heart disease of native coronary artery without angina pectoris: Secondary | ICD-10-CM

## 2017-08-04 DIAGNOSIS — Z950 Presence of cardiac pacemaker: Secondary | ICD-10-CM

## 2017-08-04 DIAGNOSIS — I482 Chronic atrial fibrillation: Secondary | ICD-10-CM

## 2017-08-04 DIAGNOSIS — I4821 Permanent atrial fibrillation: Secondary | ICD-10-CM

## 2017-08-04 LAB — CUP PACEART INCLINIC DEVICE CHECK
Battery Remaining Longevity: 132 mo
Battery Voltage: 3.03 V
Brady Statistic RV Percent Paced: 99.9 %
Date Time Interrogation Session: 20190702145145
Implantable Lead Implant Date: 20180430
Implantable Lead Location: 753860
Implantable Pulse Generator Implant Date: 20180430
Lead Channel Impedance Value: 342 Ohm
Lead Channel Impedance Value: 532 Ohm
Lead Channel Pacing Threshold Amplitude: 0.75 V
Lead Channel Sensing Intrinsic Amplitude: 8.75 mV
Lead Channel Setting Pacing Pulse Width: 0.4 ms
Lead Channel Setting Sensing Sensitivity: 1.2 mV
MDC IDC MSMT LEADCHNL RV PACING THRESHOLD PULSEWIDTH: 0.4 ms
MDC IDC MSMT LEADCHNL RV SENSING INTR AMPL: 8.125 mV
MDC IDC SET LEADCHNL RV PACING AMPLITUDE: 2.5 V

## 2017-08-04 NOTE — Patient Instructions (Addendum)
Medication Instructions:  Your physician recommends that you continue on your current medications as directed. Please refer to the Current Medication list given to you today.  Labwork: None ordered.  Testing/Procedures: None ordered.  Follow-Up: Your physician wants you to follow-up in: One Year with Dr [name]. You will receive a reminder letter in the mail two months in advance. If you don't receive a letter, please call our office to schedule the follow-up appointment.  Remote monitoring is used to monitor your Pacemaker from home. This monitoring reduces the number of office visits required to check your device to one time per year. It allows [place] to keep an eye on the functioning of your device to ensure it is working properly. You are scheduled for a device check from home on 08/20/2017. You may send your transmission at any time that day. If you have a wireless device, the transmission will be sent automatically. After your physician reviews your transmission, you will receive a postcard with your next transmission date.    Any Other Special Instructions Will Be Listed Below (If Applicable).     If you need a refill on your cardiac medications before your next appointment, please call your pharmacy.

## 2017-08-04 NOTE — Progress Notes (Signed)
Patient Care Team: Biagio Borg, MD as PCP - General   HPI  Jonathan Orozco is a 84 y.o. male Seen in followup for outflow tract ectopy and atrial flutters for which  he underwent EP testing and 3-D mapping which failed to produce stable arrhythmias; medical therapy was undertaken.  He has been intolerant of amiodarone. 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    2017 Presented with exertional chest discomfort and underwent catheterization.  1. LM lesion, 75% stenosed. 2. Ost Cx lesion, 99% stenosed. 3. Ost 1st Mrg to 1st Mrg lesion, 75% stenosed. 4. Prox LAD to Mid LAD lesion, 75% stenosed. 5. Ost 1st Diag lesion, 75% stenosed. 6. Ost 2nd Diag to 2nd Diag lesion, 65% stenosed.        3rd Diag lesion, 85% stenosed. LV function was normal. He underwent bypass surgery with LA clipping but no MAZE   He has a history of recurrent atrial arrhythmias with rapid response which we have been unable to control the rhythm strategies for rate strategy. 5/18 he underwent AV junction ablation following  pacemaker implantation.       Date Cr Hgb  5/19 0.76 14.4        The patient denies chest pain, shortness of breath, nocturnal dyspnea, orthopnea or peripheral edema.  There have been no palpitations, lightheadedness or syncope.    Past Medical History:  Diagnosis Date  . ABUSE, ALCOHOL, IN REMISSION 04/08/2007  . ALLERGIC RHINITIS 09/29/2006  . Arthritis    "hands" (07/02/2016)  . Atrial fibrillation ([place])   . Atrial fibrillation with RVR ([place]) 07/02/2016  . Atrial flutter ([place]) 03/28/2010   a. s/p prior rfca;  b. recurrent paroxysmal flutter 04/2012;  c. pradaxa initiated 04/2012.  [name] BENIGN PROSTATIC HYPERTROPHY 09/29/2006  . BPPV (benign paroxysmal positional vertigo) 04/08/2007  . CAD (coronary artery disease)    a. LHC 2/17: EF 55-65%, LM 75, pLAD 75, oD1 75, oD2 65, D3 85, oLCx 99, oOM1 75, pRCA 25 >> CABG  . Carotid  stenosis    a. Carotid US 2/17:  Bilateral ICA 1-39% ICA  . Chronic lower back pain   . COLONIC POLYPS, HX OF 06/23/2007  . 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 Diverticulosis   . ERECTILE DYSFUNCTION 09/29/2006  . GERD (gastroesophageal reflux disease)   . GLUCOSE INTOLERANCE 03/19/2010  . HYPERLIPIDEMIA 09/29/2006  . HYPERTENSION 09/29/2006  . Long term (current) use of anticoagulants 04/26/2010  . MELANOMA, MALIGNANT, SKIN NOS 09/29/2006   other skin cancers -no further melanoma  . OSTEOARTHROSIS NOS, OTHER [facility] SITE 09/29/2006  . Other specified forms of hearing loss 08/10/2009  . Pneumonia ~ 1969  . Presence of permanent cardiac pacemaker 06/02/2016  . VENTRICULAR TACHYCARDIA 09/29/2006    Past Surgical History:  Procedure Laterality Date  . AV NODE ABLATION  07/02/2016  . AV NODE ABLATION N/A 07/02/2016   Procedure: AV Node Ablation;  Surgeon: Deboraha Sprang, MD;  Location: [place] CV LAB;  Service: Cardiovascular;  Laterality: N/A;  . CARDIAC CATHETERIZATION N/A 03/12/2015   Procedure: Left Heart Cath and Coronary Angiography;  Surgeon: [name], MD;  Location: [place] CV LAB;  Service: Cardiovascular;  Laterality: N/A;  . CARDIOVERSION N/A 12/19/2015   Procedure: CARDIOVERSION;  Surgeon: [name], MD;  Location: [facility] ENDOSCOPY;  Service: Cardiovascular;  Laterality: N/A;  . CARDIOVERSION N/A 03/24/2016   Procedure: CARDIOVERSION;  Surgeon: [name],  MD;  Location: [place];  Service: Cardiovascular;  Laterality: N/A;  . CATARACT EXTRACTION W/ INTRAOCULAR LENS  IMPLANT, BILATERAL Bilateral   . CLIPPING OF ATRIAL APPENDAGE N/A 03/14/2015   Procedure: CLIPPING OF ATRIAL APPENDAGE;  Surgeon: [name], MD;  Location: [place];  Service: Open Heart Surgery;  Laterality: N/A;  . COLONOSCOPY WITH PROPOFOL N/A 12/08/2014   Procedure: COLONOSCOPY WITH PROPOFOL;  Surgeon: [name], MD;  Location: [facility] ENDOSCOPY;  Service: Endoscopy;  Laterality: N/A;   . CORONARY ARTERY BYPASS GRAFT N/A 03/14/2015   Procedure: CORONARY ARTERY BYPASS GRAFTING (CABG) x 4 (LIMA to LAD, SVG to DIAGONAL 2, SVG SEQUENTIALLY to OM1 and OM2);  Surgeon: [name], MD;  Location: [place];  Service: Open Heart Surgery;  Laterality: N/A;  . INCISION AND DRAINAGE N/A 07/03/2016   Procedure: INCISION AND DRAINAGE of CHEST ABSCESS;  Surgeon: [name], MD;  Location: [place];  Service: Thoracic;  Laterality: N/A;  . INSERT / REPLACE / REMOVE PACEMAKER  06/02/2016  . JOINT REPLACEMENT    . KNEE ARTHROSCOPY Left   . MELANOMA EXCISION     "upper back"  . PACEMAKER IMPLANT N/A 06/02/2016   Procedure: Pacemaker Implant;  Surgeon: Deboraha Sprang, MD;  Location: [place] CV LAB;  Service: Cardiovascular;  Laterality: N/A;  . SHOULDER OPEN ROTATOR CUFF REPAIR Left   . SKIN CANCER EXCISION Right    "cheek; real deep"  . TEE WITHOUT CARDIOVERSION N/A 03/14/2015   Procedure: TRANSESOPHAGEAL ECHOCARDIOGRAM (TEE);  Surgeon: [name], MD;  Location: [place];  Service: Open Heart Surgery;  Laterality: N/A;  . TEE WITHOUT CARDIOVERSION N/A 12/19/2015   Procedure: TRANSESOPHAGEAL ECHOCARDIOGRAM (TEE);  Surgeon: [name], MD;  Location: [place];  Service: Cardiovascular;  Laterality: N/A;  . TEE WITHOUT CARDIOVERSION N/A 03/24/2016   Procedure: TRANSESOPHAGEAL ECHOCARDIOGRAM (TEE);  Surgeon: [name], MD;  Location: [place];  Service: Cardiovascular;  Laterality: N/A;  . TONSILLECTOMY    . TOTAL KNEE ARTHROPLASTY Right     Current Outpatient Medications  Medication Sig Dispense Refill  . atorvastatin (LIPITOR) 10 MG tablet Take 1 tablet (10 mg total) by mouth daily. 90 tablet 3  . doxylamine, Sleep, (UNISOM) 25 MG tablet Take 25 mg by mouth at bedtime.    . fish oil-omega-3 fatty acids 1000 MG capsule Take 1 g by mouth every evening.     [name] HYDROcodone-acetaminophen (NORCO) 10-325 MG tablet Take 1 tablet by mouth daily as needed. 30  tablet 0  . Multiple Vitamin (MULTIVITAMIN) tablet Take 1 tablet by mouth daily.      . Psyllium (METAMUCIL PO) 1 TABLESPOON IN A GLASS OF WATER ONCE DAILY    . tamsulosin (FLOMAX) 0.4 MG CAPS capsule Take 1 capsule (0.4 mg total) by mouth daily. 90 capsule 3  . warfarin (COUMADIN) 5 MG tablet Take as directed by Coumadin clinic. 100 tablet 1  . ZINC OXIDE EX Apply 1 application topically as needed (itching).     No current facility-administered medications for this visit.     Allergies  Allergen Reactions  . Amiodarone Hcl Other (See Comments)    Patient reports photosensitivity  . Ace Inhibitors Cough    Review of Systems negative except from HPI and PMH  Physical Exam BP 112/62   Pulse 71   Ht 6' (1.829 m)   Wt 171 lb (77.6 kg)   SpO2 98%   BMI 23.19 kg/m  Well developed and nourished in  no acute distress HENT normal Neck supple with JVP-flat Clear Regular rate and rhythm, no murmurs or gallops Abd-soft with active BS No Clubbing cyanosis edema Skin-warm and dry A & Oriented  Grossly normal sensory and motor function    ECG demonstrates V pacing @ 71 -/17/46    Assessment and  Plan  Atrial fibrillation/flutter permanent  Complete heart block  Pacemaker Medtronic   Hypertension  CAD s/p CABG   HFpEF     PM Medtronic The patient's device was interrogated.  The information was reviewed. No changes were made in the programming.      On Anticoagulation;  No bleeding issues   Blood Pressure well controlled   Without symptoms of ischemia  Euvolemic continue current meds  We spent more than 50% of our >25 min visit in face to face counseling regarding the above

## 2017-08-14 ENCOUNTER — Other Ambulatory Visit: Payer: Self-pay | Admitting: Internal Medicine

## 2017-08-14 MED ORDER — HYDROCODONE-ACETAMINOPHEN 10-325 MG PO TABS: 1.0000 | ORAL_TABLET | Freq: Every day | ORAL | 0 refills | Status: DC | PRN

## 2017-08-14 NOTE — Telephone Encounter (Signed)
Done erx 

## 2017-08-20 ENCOUNTER — Ambulatory Visit (INDEPENDENT_AMBULATORY_CARE_PROVIDER_SITE_OTHER): Payer: Medicare Other | Admitting: *Deleted

## 2017-08-20 DIAGNOSIS — R001 Bradycardia, unspecified: Secondary | ICD-10-CM | POA: Diagnosis not present

## 2017-08-20 NOTE — Progress Notes (Signed)
Remote pacemaker transmission.   

## 2017-08-21 ENCOUNTER — Encounter: Payer: Self-pay | Admitting: Cardiology

## 2017-08-25 ENCOUNTER — Ambulatory Visit (INDEPENDENT_AMBULATORY_CARE_PROVIDER_SITE_OTHER): Payer: Medicare Other

## 2017-08-25 DIAGNOSIS — I4891 Unspecified atrial fibrillation: Secondary | ICD-10-CM | POA: Diagnosis not present

## 2017-08-25 DIAGNOSIS — Z5181 Encounter for therapeutic drug level monitoring: Secondary | ICD-10-CM | POA: Diagnosis not present

## 2017-08-25 LAB — POCT INR: INR: 2 (ref 2.0–3.0)

## 2017-08-25 NOTE — Patient Instructions (Signed)
Description   Take 1 tablet today, then resume same dosage 1 tablet daily except 1/2 tablet on Tuesdays. Recheck in 6 weeks. Call us with any medication changes or concerns # [phone].

## 2017-08-26 ENCOUNTER — Other Ambulatory Visit: Payer: Self-pay | Admitting: Pharmacist

## 2017-08-26 MED ORDER — WARFARIN SODIUM 5 MG PO TABS: ORAL_TABLET | ORAL | 1 refills | Status: DC

## 2017-08-27 LAB — CUP PACEART REMOTE DEVICE CHECK
Brady Statistic RV Percent Paced: 99.86 %
Date Time Interrogation Session: 20190718050814
Implantable Lead Location: 753860
Implantable Lead Model: 5076
Lead Channel Impedance Value: 380 Ohm
Lead Channel Impedance Value: 589 Ohm
Lead Channel Sensing Intrinsic Amplitude: 8.125 mV
Lead Channel Setting Pacing Amplitude: 2.5 V
MDC IDC LEAD IMPLANT DT: 20180430
MDC IDC MSMT BATTERY REMAINING LONGEVITY: 135 mo
MDC IDC MSMT BATTERY VOLTAGE: 3.03 V
MDC IDC MSMT LEADCHNL RV PACING THRESHOLD AMPLITUDE: 0.75 V
MDC IDC MSMT LEADCHNL RV PACING THRESHOLD PULSEWIDTH: 0.4 ms
MDC IDC MSMT LEADCHNL RV SENSING INTR AMPL: 8.75 mV
MDC IDC PG IMPLANT DT: 20180430
MDC IDC SET LEADCHNL RV PACING PULSEWIDTH: 0.4 ms
MDC IDC SET LEADCHNL RV SENSING SENSITIVITY: 1.2 mV

## 2017-09-18 ENCOUNTER — Other Ambulatory Visit: Payer: Self-pay | Admitting: Internal Medicine

## 2017-09-18 MED ORDER — HYDROCODONE-ACETAMINOPHEN 10-325 MG PO TABS: 1.0000 | ORAL_TABLET | Freq: Every day | ORAL | 0 refills | Status: DC | PRN

## 2017-09-18 NOTE — Telephone Encounter (Signed)
Done erx 

## 2017-09-18 NOTE — Telephone Encounter (Signed)
MD approved and sent electronically to pof../lmb  

## 2017-10-06 ENCOUNTER — Ambulatory Visit (INDEPENDENT_AMBULATORY_CARE_PROVIDER_SITE_OTHER): Payer: Medicare Other | Admitting: *Deleted

## 2017-10-06 DIAGNOSIS — Z5181 Encounter for therapeutic drug level monitoring: Secondary | ICD-10-CM

## 2017-10-06 DIAGNOSIS — I4891 Unspecified atrial fibrillation: Secondary | ICD-10-CM

## 2017-10-06 LAB — POCT INR: INR: 2 (ref 2.0–3.0)

## 2017-10-06 NOTE — Patient Instructions (Signed)
Description   Take 1 tablet today, then resume same dosage 1 tablet daily except 1/2 tablet on Tuesdays. Recheck in 6 weeks. Call us with any medication changes or concerns # [phone].

## 2017-10-20 ENCOUNTER — Other Ambulatory Visit: Payer: Self-pay | Admitting: Internal Medicine

## 2017-10-20 MED ORDER — HYDROCODONE-ACETAMINOPHEN 10-325 MG PO TABS: 1.0000 | ORAL_TABLET | Freq: Every day | ORAL | 0 refills | Status: DC | PRN

## 2017-10-20 NOTE — Telephone Encounter (Unsigned)
Copied from [place] [id number]. Topic: Quick Communication - Rx Refill/Question >> Oct 20, 2017  1:09 PM Mcneil, Ja-Kwan wrote: Medication: HYDROcodone-acetaminophen (NORCO) 10-325 MG tablet  Has the patient contacted their pharmacy? no  Preferred Pharmacy (with phone number or street name): [facility], [phone] (Phone) [phone] (Fax)  Agent: Please be advised that RX refills may take up to 3 business days. We ask that you follow-up with your pharmacy.

## 2017-10-20 NOTE — Telephone Encounter (Signed)
Done erx 

## 2017-10-20 NOTE — Telephone Encounter (Signed)
Rx refill request: Hydrocodone- acetaminophen 10-325 mg     Last filled: 09/18/17  LOV: 12/23/16  PCP: [place]: [facility]/ [address]

## 2017-11-17 ENCOUNTER — Ambulatory Visit (INDEPENDENT_AMBULATORY_CARE_PROVIDER_SITE_OTHER): Payer: Medicare Other | Admitting: Family

## 2017-11-17 ENCOUNTER — Ambulatory Visit (INDEPENDENT_AMBULATORY_CARE_PROVIDER_SITE_OTHER)
Admission: RE | Admit: 2017-11-17 | Discharge: 2017-11-17 | Disposition: A | Discharge status: Home / Self Care | Payer: Medicare Other | Source: Ambulatory Visit | Attending: Family | Admitting: Family

## 2017-11-17 ENCOUNTER — Encounter: Payer: Self-pay | Admitting: Family

## 2017-11-17 ENCOUNTER — Ambulatory Visit (INDEPENDENT_AMBULATORY_CARE_PROVIDER_SITE_OTHER): Payer: Medicare Other

## 2017-11-17 ENCOUNTER — Other Ambulatory Visit (INDEPENDENT_AMBULATORY_CARE_PROVIDER_SITE_OTHER): Payer: Medicare Other

## 2017-11-17 VITALS — BP 120/78 | HR 77 | Temp 97.5°F | Ht 72.0 in | Wt 170.1 lb

## 2017-11-17 DIAGNOSIS — R109 Unspecified abdominal pain: Secondary | ICD-10-CM | POA: Diagnosis not present

## 2017-11-17 DIAGNOSIS — Z5181 Encounter for therapeutic drug level monitoring: Secondary | ICD-10-CM | POA: Diagnosis not present

## 2017-11-17 DIAGNOSIS — Z23 Encounter for immunization: Secondary | ICD-10-CM | POA: Diagnosis not present

## 2017-11-17 DIAGNOSIS — I4891 Unspecified atrial fibrillation: Secondary | ICD-10-CM | POA: Diagnosis not present

## 2017-11-17 DIAGNOSIS — H6982 Other specified disorders of Eustachian tube, left ear: Secondary | ICD-10-CM

## 2017-11-17 LAB — POCT INR: INR: 2.2 (ref 2.0–3.0)

## 2017-11-17 LAB — BASIC METABOLIC PANEL
BUN: 12 mg/dL (ref 6–23)
CO2: 30 mEq/L (ref 19–32)
CREATININE: 0.69 mg/dL (ref 0.40–1.50)
Calcium: 9.6 mg/dL (ref 8.4–10.5)
Chloride: 100 mEq/L (ref 96–112)
GFR: 115.07 mL/min (ref >60.00)
Glucose, Bld: 102 mg/dL — ABNORMAL HIGH (ref 70–99)
POTASSIUM: 4 meq/L (ref 3.5–5.1)
Sodium: 136 mEq/L (ref 135–145)

## 2017-11-17 MED ORDER — FLUTICASONE PROPIONATE 50 MCG/ACT NA SUSP: 2.0000 | Freq: Every day | NASAL | 1 refills | Status: DC

## 2017-11-17 NOTE — Patient Instructions (Signed)
Description   Continue on same dosage 1 tablet daily except 1/2 tablet on Tuesdays. Recheck in 6 weeks. Call us with any medication changes or concerns # [phone].

## 2017-11-17 NOTE — Progress Notes (Signed)
Jonathan Orozco is a 84 y.o. male with the following history as recorded in EpicCare:  Patient Active Problem List   Diagnosis Date Noted  . IBS (irritable bowel syndrome) 06/30/2017  . Preventative health care 06/19/2016  . Increased prostate specific antigen (PSA) velocity 06/19/2016  . Gait disorder 01/27/2016  . Chronic anticoagulation 12/18/2015  . Hyperglycemia 06/17/2015  . CAD (coronary artery disease), native coronary artery 03/12/2015  . Atrial fibrillation with rapid ventricular response ([place])   . Encounter for therapeutic drug monitoring 06/20/2013  . Solitary pulmonary nodule 05/24/2013  . Anxiety state 05/21/2012  . Chronic low back pain 05/21/2012  . Long term (current) use of anticoagulants 04/24/2012  . Hearing loss of both ears 11/11/2010  . FATIGUE 11/16/2009  . VERTIGO 04/08/2007  . MELANOMA, MALIGNANT, SKIN NOS 09/29/2006  . Hyperlipidemia 09/29/2006  . ERECTILE DYSFUNCTION 09/29/2006  . VENTRICULAR TACHYCARDIA 09/29/2006  . BENIGN PROSTATIC HYPERTROPHY 09/29/2006  . OSTEOARTHROSIS NOS, OTHER [place] SITE 09/29/2006    Current Outpatient Medications  Medication Sig Dispense Refill  . atorvastatin (LIPITOR) 10 MG tablet Take 1 tablet (10 mg total) by mouth daily. 90 tablet 3  . Benzethonium Cl & Zinc Oxide 0.13 & 10 % KIT Apply topically.    [name] doxylamine, Sleep, (UNISOM) 25 MG tablet Take 25 mg by mouth at bedtime.    . fish oil-omega-3 fatty acids 1000 MG capsule Take 1 g by mouth every evening.     [name] HYDROcodone-acetaminophen (NORCO) 10-325 MG tablet Take 1 tablet by mouth daily as needed. 30 tablet 0  . Multiple Vitamin (MULTIVITAMIN) tablet Take 1 tablet by mouth daily.      [name] omega-3 acid ethyl esters (LOVAZA) 1 g capsule Take by mouth.    . Psyllium (METAMUCIL PO) 1 TABLESPOON IN A GLASS OF WATER ONCE DAILY    . tamsulosin (FLOMAX) 0.4 MG CAPS capsule Take 1 capsule (0.4 mg total) by mouth daily. 90 capsule 3  . warfarin (COUMADIN) 5 MG tablet Take as  directed by Coumadin clinic. 100 tablet 1  . ZINC OXIDE EX Apply 1 application topically as needed (itching).    . fluticasone (FLONASE) 50 MCG/ACT nasal spray Place 2 sprays into both nostrils daily. 16 g 1   No current facility-administered medications for this visit.     Allergies: Amiodarone hcl and Ace inhibitors  Past Medical History:  Diagnosis Date  . ABUSE, ALCOHOL, IN REMISSION 04/08/2007  . ALLERGIC RHINITIS 09/29/2006  . Arthritis    "hands" (07/02/2016)  . Atrial fibrillation ([place])   . Atrial fibrillation with RVR ([place]) 07/02/2016  . Atrial flutter ([place]) 03/28/2010   a. s/p prior rfca;  b. recurrent paroxysmal flutter 04/2012;  c. pradaxa initiated 04/2012.  [name] BENIGN PROSTATIC HYPERTROPHY 09/29/2006  . BPPV (benign paroxysmal positional vertigo) 04/08/2007  . CAD (coronary artery disease)    a. LHC 2/17: EF 55-65%, LM 75, pLAD 75, oD1 75, oD2 65, D3 85, oLCx 99, oOM1 75, pRCA 25 >> CABG  . Carotid stenosis    a. Carotid US 2/17:  Bilateral ICA 1-39% ICA  . Chronic lower back pain   . COLONIC POLYPS, HX OF 06/23/2007  . 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 Diverticulosis   . ERECTILE DYSFUNCTION 09/29/2006  . GERD (gastroesophageal reflux disease)   . GLUCOSE INTOLERANCE 03/19/2010  . HYPERLIPIDEMIA 09/29/2006  . HYPERTENSION 09/29/2006  . Long term (current) use of anticoagulants 04/26/2010  . MELANOMA, MALIGNANT, SKIN NOS 09/29/2006   other skin cancers -  no further melanoma  . OSTEOARTHROSIS NOS, OTHER [facility] SITE 09/29/2006  . Other specified forms of hearing loss 08/10/2009  . Pneumonia ~ 1969  . Presence of permanent cardiac pacemaker 06/02/2016  . VENTRICULAR TACHYCARDIA 09/29/2006    Past Surgical History:  Procedure Laterality Date  . AV NODE ABLATION  07/02/2016  . AV NODE ABLATION N/A 07/02/2016   Procedure: AV Node Ablation;  Surgeon: Deboraha Sprang, MD;  Location: [place] CV LAB;  Service: Cardiovascular;  Laterality: N/A;  . CARDIAC CATHETERIZATION N/A  03/12/2015   Procedure: Left Heart Cath and Coronary Angiography;  Surgeon: [name], MD;  Location: [place] CV LAB;  Service: Cardiovascular;  Laterality: N/A;  . CARDIOVERSION N/A 12/19/2015   Procedure: CARDIOVERSION;  Surgeon: [name], MD;  Location: [facility] ENDOSCOPY;  Service: Cardiovascular;  Laterality: N/A;  . CARDIOVERSION N/A 03/24/2016   Procedure: CARDIOVERSION;  Surgeon: [name], MD;  Location: [place];  Service: Cardiovascular;  Laterality: N/A;  . CATARACT EXTRACTION W/ INTRAOCULAR LENS  IMPLANT, BILATERAL Bilateral   . CLIPPING OF ATRIAL APPENDAGE N/A 03/14/2015   Procedure: CLIPPING OF ATRIAL APPENDAGE;  Surgeon: [name], MD;  Location: [place];  Service: Open Heart Surgery;  Laterality: N/A;  . COLONOSCOPY WITH PROPOFOL N/A 12/08/2014   Procedure: COLONOSCOPY WITH PROPOFOL;  Surgeon: [name], MD;  Location: [facility] ENDOSCOPY;  Service: Endoscopy;  Laterality: N/A;  . CORONARY ARTERY BYPASS GRAFT N/A 03/14/2015   Procedure: CORONARY ARTERY BYPASS GRAFTING (CABG) x 4 (LIMA to LAD, SVG to DIAGONAL 2, SVG SEQUENTIALLY to OM1 and OM2);  Surgeon: [name], MD;  Location: [place];  Service: Open Heart Surgery;  Laterality: N/A;  . INCISION AND DRAINAGE N/A 07/03/2016   Procedure: INCISION AND DRAINAGE of CHEST ABSCESS;  Surgeon: [name], MD;  Location: [place];  Service: Thoracic;  Laterality: N/A;  . INSERT / REPLACE / REMOVE PACEMAKER  06/02/2016  . JOINT REPLACEMENT    . KNEE ARTHROSCOPY Left   . MELANOMA EXCISION     "upper back"  . PACEMAKER IMPLANT N/A 06/02/2016   Procedure: Pacemaker Implant;  Surgeon: Deboraha Sprang, MD;  Location: [place] CV LAB;  Service: Cardiovascular;  Laterality: N/A;  . SHOULDER OPEN ROTATOR CUFF REPAIR Left   . SKIN CANCER EXCISION Right    "cheek; real deep"  . TEE WITHOUT CARDIOVERSION N/A 03/14/2015   Procedure: TRANSESOPHAGEAL ECHOCARDIOGRAM (TEE);  Surgeon: [name], MD;   Location: [place];  Service: Open Heart Surgery;  Laterality: N/A;  . TEE WITHOUT CARDIOVERSION N/A 12/19/2015   Procedure: TRANSESOPHAGEAL ECHOCARDIOGRAM (TEE);  Surgeon: [name], MD;  Location: [place];  Service: Cardiovascular;  Laterality: N/A;  . TEE WITHOUT CARDIOVERSION N/A 03/24/2016   Procedure: TRANSESOPHAGEAL ECHOCARDIOGRAM (TEE);  Surgeon: [name], MD;  Location: [place];  Service: Cardiovascular;  Laterality: N/A;  . TONSILLECTOMY    . TOTAL KNEE ARTHROPLASTY Right     Family History  Problem Relation Age of Onset  . Diabetes Brother   . Esophageal cancer Father   . Colon cancer Neg Hx   . Stomach cancer Neg Hx     Social History   Tobacco Use  . Smoking status: Former Smoker    Packs/day: 1.00    Years: 52.00    Pack years: 52.00    Types: Cigarettes  . Smokeless tobacco: Never Used  . Tobacco comment: 07/02/2016 "quit before 2000"  Substance Use Topics  .  Alcohol use: No    Frequency: Never    Comment: 07/02/2016 "quit before 2000"    Subjective:  Patient presents with his wife; history of IBS; notes he has been having problems with constipation/ diarrhea on and off for 3 months;  wife mentions that he has actually been having problems with his bowels "on and off" for years; does use Metamucil regularly; per patient, no unexplained weight loss; no nausea or vomiting; no coffee ground emesis, no blood noted in stool. Per patient, he just "doesn't feel like his bowels are working like they should be."  Concerned that not hearing as well from left ear with hearing aid; does occasionally have problems with ear wax; had hearing aid checked in the past few months.  Would also like to get his flu shot today;      Objective:  Vitals:   11/17/17 1311  BP: 120/78  Pulse: 77  Temp: (!) 97.5 F (36.4 C)  TempSrc: Oral  SpO2: 97%  Weight: 170 lb 1.9 oz (77.2 kg)  Height: 6' (1.829 m)    General: Well developed, well nourished, in no acute  distress  Skin : Warm and dry.  Head: Normocephalic and atraumatic  Eyes: Sclera and conjunctiva clear; pupils round and reactive to light; extraocular movements intact  Ears: External normal; canals clear; tympanic membranes congested Oropharynx: Pink, supple. No suspicious lesions  Lungs: Respirations unlabored;  Abdomen: Soft; nontender; nondistended; normoactive bowel sounds; no masses or hepatosplenomegaly  Neurologic: Alert and oriented; speech intact; face symmetrical; moves all extremities well; CNII-XII intact without focal deficit   Assessment:  1. Abdominal pain, unspecified abdominal location   2. Dysfunction of left eustachian tube   3. Need for influenza vaccination     Plan:  1. Low suspicion for obstruction; it sounds like this has been a chronic problem for this patient for years/ diagnosed with IBS; however, he and his wife both agree they would like to have imaging of the abdomen updated; will update X-ray today; may also plan to update CT; 2. Trial of Flonase NS; 3. Flu shot given;   No follow-ups on file.  Orders Placed This Encounter  Procedures  . DG Abd 2 Views    Standing Status:   Future    Standing Expiration Date:   01/18/2019    Order Specific Question:   Reason for Exam (SYMPTOM  OR DIAGNOSIS REQUIRED)    Answer:   abdominal pain    Order Specific Question:   Preferred imaging location?    Answer:   [facility]    Order Specific Question:   Radiology Contrast Protocol - do NOT remove file path    Answer:   [email or web address]  . Flu vaccine HIGH DOSE PF  . Basic Metabolic Panel (BMET)    Standing Status:   Future    Number of Occurrences:   1    Standing Expiration Date:   11/17/2018    Requested Prescriptions   Signed Prescriptions Disp Refills  . fluticasone (FLONASE) 50 MCG/ACT nasal spray 16 g 1    Sig: Place 2 sprays into both nostrils daily.

## 2017-11-18 ENCOUNTER — Ambulatory Visit: Payer: Medicare Other

## 2017-11-19 ENCOUNTER — Other Ambulatory Visit: Payer: Self-pay | Admitting: Internal Medicine

## 2017-11-19 ENCOUNTER — Ambulatory Visit (INDEPENDENT_AMBULATORY_CARE_PROVIDER_SITE_OTHER): Payer: Medicare Other | Admitting: *Deleted

## 2017-11-19 DIAGNOSIS — I480 Paroxysmal atrial fibrillation: Secondary | ICD-10-CM

## 2017-11-19 DIAGNOSIS — I4892 Unspecified atrial flutter: Secondary | ICD-10-CM

## 2017-11-19 MED ORDER — HYDROCODONE-ACETAMINOPHEN 10-325 MG PO TABS: 1.0000 | ORAL_TABLET | Freq: Every day | ORAL | 0 refills | Status: DC | PRN

## 2017-11-19 NOTE — Progress Notes (Signed)
Remote pacemaker transmission.   

## 2017-11-19 NOTE — Telephone Encounter (Signed)
Done erx 

## 2017-11-19 NOTE — Telephone Encounter (Signed)
MD approved and sent electronically to pof../lmb  

## 2017-11-20 NOTE — Telephone Encounter (Signed)
Norco already done oct 17

## 2017-12-20 ENCOUNTER — Emergency Department (HOSPITAL_COMMUNITY)
Admission: EM | Admit: 2017-12-20 | Discharge: 2017-12-20 | Disposition: A | Discharge status: Home / Self Care | Payer: Medicare Other | Attending: Emergency Medicine | Admitting: Emergency Medicine

## 2017-12-20 ENCOUNTER — Emergency Department (HOSPITAL_COMMUNITY): Payer: Medicare Other

## 2017-12-20 ENCOUNTER — Encounter (HOSPITAL_COMMUNITY): Payer: Self-pay | Admitting: Emergency Medicine

## 2017-12-20 DIAGNOSIS — I2510 Atherosclerotic heart disease of native coronary artery without angina pectoris: Secondary | ICD-10-CM | POA: Insufficient documentation

## 2017-12-20 DIAGNOSIS — Y998 Other external cause status: Secondary | ICD-10-CM | POA: Insufficient documentation

## 2017-12-20 DIAGNOSIS — I4891 Unspecified atrial fibrillation: Secondary | ICD-10-CM | POA: Diagnosis not present

## 2017-12-20 DIAGNOSIS — Y939 Activity, unspecified: Secondary | ICD-10-CM | POA: Insufficient documentation

## 2017-12-20 DIAGNOSIS — Z79899 Other long term (current) drug therapy: Secondary | ICD-10-CM | POA: Diagnosis not present

## 2017-12-20 DIAGNOSIS — Y33XXXA Other specified events, undetermined intent, initial encounter: Secondary | ICD-10-CM | POA: Insufficient documentation

## 2017-12-20 DIAGNOSIS — Z87891 Personal history of nicotine dependence: Secondary | ICD-10-CM | POA: Diagnosis not present

## 2017-12-20 DIAGNOSIS — Z7901 Long term (current) use of anticoagulants: Secondary | ICD-10-CM | POA: Insufficient documentation

## 2017-12-20 DIAGNOSIS — E785 Hyperlipidemia, unspecified: Secondary | ICD-10-CM | POA: Diagnosis not present

## 2017-12-20 DIAGNOSIS — S52125A Nondisplaced fracture of head of left radius, initial encounter for closed fracture: Secondary | ICD-10-CM

## 2017-12-20 DIAGNOSIS — Y929 Unspecified place or not applicable: Secondary | ICD-10-CM | POA: Insufficient documentation

## 2017-12-20 DIAGNOSIS — I10 Essential (primary) hypertension: Secondary | ICD-10-CM | POA: Insufficient documentation

## 2017-12-20 DIAGNOSIS — S59902A Unspecified injury of left elbow, initial encounter: Secondary | ICD-10-CM | POA: Diagnosis present

## 2017-12-20 MED ORDER — HYDROCODONE-ACETAMINOPHEN 5-325 MG PO TABS
2.0000 | ORAL_TABLET | Freq: Once | ORAL | Status: AC
  Administered 2017-12-20: 2 via ORAL
  Filled 2017-12-20: qty 2

## 2017-12-20 NOTE — Discharge Instructions (Signed)
You need to follow-up with your orthopedic doctor.  There is evidence of a small break in your elbow.  Please take your regular home pain medicine as directed.

## 2017-12-20 NOTE — ED Provider Notes (Signed)
[place] EMERGENCY DEPARTMENT Provider Note   CSN: [id number] Arrival date & time: 12/20/17  0410     History   Chief Complaint Chief Complaint  Patient presents with  . Elbow Pain    HPI Jonathan Orozco is a 84 y.o. male.  Patient presents to the emergency department with a chief complaint of left elbow pain.  States that he had fluid taken off of his left elbow by Dr. Berenice Primas from orthopedics.  States that he has Vicodin, but does not take it.  He denies any fevers chills.  Reports increased pain with movement of his left elbow.  Feels like something is loose on the inside of his elbow.  Denies any numbness, weakness, or tingling.  Denies any redness.  Denies injury.  The history is provided by the patient. No language interpreter was used.    Past Medical History:  Diagnosis Date  . ABUSE, ALCOHOL, IN REMISSION 04/08/2007  . ALLERGIC RHINITIS 09/29/2006  . Arthritis    "hands" (07/02/2016)  . Atrial fibrillation ([place])   . Atrial fibrillation with RVR ([place]) 07/02/2016  . Atrial flutter ([place]) 03/28/2010   a. s/p prior rfca;  b. recurrent paroxysmal flutter 04/2012;  c. pradaxa initiated 04/2012.  [name] BENIGN PROSTATIC HYPERTROPHY 09/29/2006  . BPPV (benign paroxysmal positional vertigo) 04/08/2007  . CAD (coronary artery disease)    a. LHC 2/17: EF 55-65%, LM 75, pLAD 75, oD1 75, oD2 65, D3 85, oLCx 99, oOM1 75, pRCA 25 >> CABG  . Carotid stenosis    a. Carotid US 2/17:  Bilateral ICA 1-39% ICA  . Chronic lower back pain   . COLONIC POLYPS, HX OF 06/23/2007  . 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 Diverticulosis   . ERECTILE DYSFUNCTION 09/29/2006  . GERD (gastroesophageal reflux disease)   . GLUCOSE INTOLERANCE 03/19/2010  . HYPERLIPIDEMIA 09/29/2006  . HYPERTENSION 09/29/2006  . Long term (current) use of anticoagulants 04/26/2010  . MELANOMA, MALIGNANT, SKIN NOS 09/29/2006   other skin cancers -no further melanoma  . OSTEOARTHROSIS NOS, OTHER [facility] SITE  09/29/2006  . Other specified forms of hearing loss 08/10/2009  . Pneumonia ~ 1969  . Presence of permanent cardiac pacemaker 06/02/2016  . VENTRICULAR TACHYCARDIA 09/29/2006    Patient Active Problem List   Diagnosis Date Noted  . IBS (irritable bowel syndrome) 06/30/2017  . Preventative health care 06/19/2016  . Increased prostate specific antigen (PSA) velocity 06/19/2016  . Gait disorder 01/27/2016  . Chronic anticoagulation 12/18/2015  . Hyperglycemia 06/17/2015  . CAD (coronary artery disease), native coronary artery 03/12/2015  . Atrial fibrillation with rapid ventricular response ([place])   . Encounter for therapeutic drug monitoring 06/20/2013  . Solitary pulmonary nodule 05/24/2013  . Anxiety state 05/21/2012  . Chronic low back pain 05/21/2012  . Long term (current) use of anticoagulants 04/24/2012  . Hearing loss of both ears 11/11/2010  . FATIGUE 11/16/2009  . VERTIGO 04/08/2007  . MELANOMA, MALIGNANT, SKIN NOS 09/29/2006  . Hyperlipidemia 09/29/2006  . ERECTILE DYSFUNCTION 09/29/2006  . VENTRICULAR TACHYCARDIA 09/29/2006  . BENIGN PROSTATIC HYPERTROPHY 09/29/2006  . OSTEOARTHROSIS NOS, OTHER [facility] SITE 09/29/2006    Past Surgical History:  Procedure Laterality Date  . AV NODE ABLATION  07/02/2016  . AV NODE ABLATION N/A 07/02/2016   Procedure: AV Node Ablation;  Surgeon: Deboraha Sprang, MD;  Location: [place] CV LAB;  Service: Cardiovascular;  Laterality: N/A;  . CARDIAC CATHETERIZATION N/A 03/12/2015   Procedure: Left Heart Cath  and Coronary Angiography;  Surgeon: [name], MD;  Location: [place] CV LAB;  Service: Cardiovascular;  Laterality: N/A;  . CARDIOVERSION N/A 12/19/2015   Procedure: CARDIOVERSION;  Surgeon: [name], MD;  Location: [facility] ENDOSCOPY;  Service: Cardiovascular;  Laterality: N/A;  . CARDIOVERSION N/A 03/24/2016   Procedure: CARDIOVERSION;  Surgeon: [name], MD;  Location: [place];  Service: Cardiovascular;  Laterality:  N/A;  . CATARACT EXTRACTION W/ INTRAOCULAR LENS  IMPLANT, BILATERAL Bilateral   . CLIPPING OF ATRIAL APPENDAGE N/A 03/14/2015   Procedure: CLIPPING OF ATRIAL APPENDAGE;  Surgeon: [name], MD;  Location: [place];  Service: Open Heart Surgery;  Laterality: N/A;  . COLONOSCOPY WITH PROPOFOL N/A 12/08/2014   Procedure: COLONOSCOPY WITH PROPOFOL;  Surgeon: [name], MD;  Location: [facility] ENDOSCOPY;  Service: Endoscopy;  Laterality: N/A;  . CORONARY ARTERY BYPASS GRAFT N/A 03/14/2015   Procedure: CORONARY ARTERY BYPASS GRAFTING (CABG) x 4 (LIMA to LAD, SVG to DIAGONAL 2, SVG SEQUENTIALLY to OM1 and OM2);  Surgeon: [name], MD;  Location: [place];  Service: Open Heart Surgery;  Laterality: N/A;  . INCISION AND DRAINAGE N/A 07/03/2016   Procedure: INCISION AND DRAINAGE of CHEST ABSCESS;  Surgeon: [name], MD;  Location: [place];  Service: Thoracic;  Laterality: N/A;  . INSERT / REPLACE / REMOVE PACEMAKER  06/02/2016  . JOINT REPLACEMENT    . KNEE ARTHROSCOPY Left   . MELANOMA EXCISION     "upper back"  . PACEMAKER IMPLANT N/A 06/02/2016   Procedure: Pacemaker Implant;  Surgeon: Deboraha Sprang, MD;  Location: [place] CV LAB;  Service: Cardiovascular;  Laterality: N/A;  . SHOULDER OPEN ROTATOR CUFF REPAIR Left   . SKIN CANCER EXCISION Right    "cheek; real deep"  . TEE WITHOUT CARDIOVERSION N/A 03/14/2015   Procedure: TRANSESOPHAGEAL ECHOCARDIOGRAM (TEE);  Surgeon: [name], MD;  Location: [place];  Service: Open Heart Surgery;  Laterality: N/A;  . TEE WITHOUT CARDIOVERSION N/A 12/19/2015   Procedure: TRANSESOPHAGEAL ECHOCARDIOGRAM (TEE);  Surgeon: [name], MD;  Location: [place];  Service: Cardiovascular;  Laterality: N/A;  . TEE WITHOUT CARDIOVERSION N/A 03/24/2016   Procedure: TRANSESOPHAGEAL ECHOCARDIOGRAM (TEE);  Surgeon: [name], MD;  Location: [place];  Service: Cardiovascular;  Laterality: N/A;  . TONSILLECTOMY    . TOTAL KNEE  ARTHROPLASTY Right         Home Medications    Prior to Admission medications   Medication Sig Start Date End Date Taking? Authorizing Provider  atorvastatin (LIPITOR) 10 MG tablet Take 1 tablet (10 mg total) by mouth daily. 06/30/17   Biagio Borg, MD  Benzethonium Cl & Zinc Oxide 0.13 & 10 % KIT Apply topically.    [provider]  doxylamine, Sleep, (UNISOM) 25 MG tablet Take 25 mg by mouth at bedtime.    [provider]  fish oil-omega-3 fatty acids 1000 MG capsule Take 1 g by mouth every evening.     [provider]  fluticasone (FLONASE) 50 MCG/ACT nasal spray Place 2 sprays into both nostrils daily. 11/17/17   Marrian Salvage, FNP  HYDROcodone-acetaminophen [facility]) 10-325 MG tablet Take 1 tablet by mouth daily as needed. 11/19/17   Biagio Borg, MD  Multiple Vitamin (MULTIVITAMIN) tablet Take 1 tablet by mouth daily.      [provider]  omega-3 acid ethyl esters (LOVAZA) 1 g capsule Take by mouth.    [provider]  Psyllium (METAMUCIL PO) 1 TABLESPOON IN A GLASS OF WATER ONCE DAILY    [provider]  tamsulosin (FLOMAX) 0.4 MG CAPS capsule Take 1 capsule (0.4 mg total) by mouth daily. 06/30/17   Biagio Borg, MD  warfarin (COUMADIN) 5 MG tablet Take as directed by Coumadin clinic. 08/26/17   Deboraha Sprang, MD  ZINC OXIDE EX Apply 1 application topically as needed (itching).    [provider]    Family History Family History  Problem Relation Age of Onset  . Diabetes Brother   . Esophageal cancer Father   . Colon cancer Neg Hx   . Stomach cancer Neg Hx     Social History Social History   Tobacco Use  . Smoking status: Former Smoker    Packs/day: 1.00    Years: 52.00    Pack years: 52.00    Types: Cigarettes  . Smokeless tobacco: Never Used  . Tobacco comment: 07/02/2016 "quit before 2000"  Substance Use Topics  . Alcohol use: No    Frequency: Never    Comment: 07/02/2016 "quit before 2000"   . Drug use: No     Allergies   Amiodarone hcl and Ace inhibitors   Review of Systems Review of Systems  All other systems reviewed and are negative.    Physical Exam Updated Vital Signs BP (!) 163/87 (BP Location: Right Arm)   Pulse 72   Temp 98.4 F (36.9 C) (Oral)   Resp 18   Ht '5\' 10"'$  (1.778 m)   Wt 74.8 kg   SpO2 96%   BMI 23.68 kg/m   Physical Exam  Constitutional: He is oriented to person, place, and time. No distress.  HENT:  Head: Normocephalic and atraumatic.  Eyes: Pupils are equal, round, and reactive to light. Conjunctivae and EOM are normal.  Neck: No tracheal deviation present.  Cardiovascular: Normal rate and intact distal pulses.  Pulmonary/Chest: Effort normal. No respiratory distress.  Abdominal: Soft.  Musculoskeletal: Normal range of motion.  ROM and strength of left elbow limited 2/2 pain, no redness, no palpable effusion  Neurological: He is alert and oriented to person, place, and time.  Skin: Skin is warm and dry. He is not diaphoretic.  Psychiatric: Judgment normal.  Nursing note and vitals reviewed.    ED Treatments / Results  Labs (all labs ordered are listed, but only abnormal results are displayed) Labs Reviewed - No data to display  EKG None  Radiology Dg Elbow Complete Left  Result Date: 12/20/2017 CLINICAL DATA:  Acute onset of left elbow pain for 3 days. Initial encounter. EXAM: LEFT ELBOW - COMPLETE 3+ VIEW COMPARISON:  None. FINDINGS: Cortical irregularity about the radial head could reflect a fracture of indeterminate age. Evaluation for elbow joint effusion is suboptimal due to scattered degenerative fragments about the joint space. Degenerative change is noted about the olecranon, with scattered capsular calcification. Diffuse vascular calcifications are seen. IMPRESSION: 1. Cortical irregularity about the radial head could reflect a fracture of indeterminate age. Underlying degenerative changes noted about the elbow. 2.  Diffuse vascular calcifications seen. Electronically Signed   By: Garald Balding M.D.   On: 12/20/2017 05:01    Procedures Procedures (including critical care time)  Medications Ordered in ED Medications  HYDROcodone-acetaminophen (NORCO/VICODIN) 5-325 MG per tablet 2 tablet (has no administration in time range)     Initial Impression / Assessment and Plan / ED Course  I have reviewed the triage vital signs and the nursing notes.  Pertinent  labs & imaging results that were available during my care of the patient were reviewed by me and considered in my medical decision making (see chart for details).     Patient presents with injury to left elbow.  DDx includes, fracture, strain, or sprain.  Consultants: none  Plain films reveal evidence of radial head fracture.  Pt advised to follow up with PCP and/or orthopedics. Patient given posterior splint while in ED, conservative therapy such as RICE recommended and discussed.   Patient will be discharged home & is agreeable with above plan. Returns precautions discussed. Pt appears safe for discharge.   Final Clinical Impressions(s) / ED Diagnoses   Final diagnoses:  Closed nondisplaced fracture of head of left radius, initial encounter    ED Discharge Orders    None       Montine Circle, PA-C 12/20/17 2072    Ezequiel [place], MD 12/20/17 [phone]

## 2017-12-20 NOTE — ED Triage Notes (Signed)
Pt reports left elbow pain.  Has been treated by PCP and fluid was taken off however the pain is back.

## 2017-12-20 NOTE — Progress Notes (Signed)
Orthopedic Tech Progress Note Patient Details:  Jonathan Orozco August 22, 1928 [id number]  Ortho Devices Type of Ortho Device: Post (long arm) splint Ortho Device/Splint Interventions: Application   Post Interventions Patient Tolerated: Well Instructions Provided: Adjustment of device, Care of device   Melony Overly T 12/20/2017, 6:18 AM

## 2017-12-21 ENCOUNTER — Other Ambulatory Visit: Payer: Self-pay | Admitting: Internal Medicine

## 2017-12-21 MED ORDER — HYDROCODONE-ACETAMINOPHEN 10-325 MG PO TABS: 1.0000 | ORAL_TABLET | Freq: Every day | ORAL | 0 refills | Status: DC | PRN

## 2017-12-21 NOTE — Telephone Encounter (Signed)
Done erx 

## 2017-12-29 ENCOUNTER — Ambulatory Visit (INDEPENDENT_AMBULATORY_CARE_PROVIDER_SITE_OTHER): Payer: Medicare Other

## 2017-12-29 DIAGNOSIS — Z5181 Encounter for therapeutic drug level monitoring: Secondary | ICD-10-CM | POA: Diagnosis not present

## 2017-12-29 DIAGNOSIS — I4891 Unspecified atrial fibrillation: Secondary | ICD-10-CM | POA: Diagnosis not present

## 2017-12-29 LAB — POCT INR: INR: 2.5 (ref 2.0–3.0)

## 2017-12-29 NOTE — Patient Instructions (Signed)
Continue on same dosage 1 tablet daily except 1/2 tablet on Tuesdays. Recheck in 6 weeks. Call us with any medication changes or concerns # [phone].

## 2018-01-05 ENCOUNTER — Ambulatory Visit (HOSPITAL_COMMUNITY)
Admission: RE | Admit: 2018-01-05 | Discharge: 2018-01-05 | Disposition: A | Discharge status: Home / Self Care | Payer: Medicare Other | Source: Ambulatory Visit | Attending: Cardiovascular Disease | Admitting: Cardiovascular Disease

## 2018-01-05 ENCOUNTER — Encounter: Payer: Self-pay | Admitting: Internal Medicine

## 2018-01-05 ENCOUNTER — Ambulatory Visit: Payer: Medicare Other | Admitting: Internal Medicine

## 2018-01-05 ENCOUNTER — Ambulatory Visit (INDEPENDENT_AMBULATORY_CARE_PROVIDER_SITE_OTHER): Payer: Medicare Other | Admitting: Internal Medicine

## 2018-01-05 VITALS — BP 118/76 | HR 88 | Temp 97.6°F | Ht 70.0 in | Wt 179.0 lb

## 2018-01-05 DIAGNOSIS — M7989 Other specified soft tissue disorders: Secondary | ICD-10-CM

## 2018-01-05 DIAGNOSIS — R739 Hyperglycemia, unspecified: Secondary | ICD-10-CM | POA: Diagnosis not present

## 2018-01-05 DIAGNOSIS — E785 Hyperlipidemia, unspecified: Secondary | ICD-10-CM

## 2018-01-05 NOTE — Assessment & Plan Note (Signed)
stable overall by history and exam, recent data reviewed with pt, and pt to continue medical treatment as before,  to f/u any worsening symptoms or concerns  

## 2018-01-05 NOTE — Patient Instructions (Addendum)
Your ears were cleared of wax today  You will be contacted regarding the referral for: Left Arm Venous Doppler (to see [facility] now)   Please continue all other medications as before, and refills have been done if requested.  Please have the pharmacy call with any other refills you may need.  Please continue your efforts at being more active, low cholesterol diet, and weight control.   Please keep your appointments with your specialists as you may have planned  Please return in 3 months, or sooner if needed

## 2018-01-05 NOTE — Progress Notes (Signed)
Subjective:    Patient ID: Jonathan Orozco, male    DOB: 03-01-1928, 84 y.o.   MRN: [id number]  HPI  Here to f/u with recent hx of ongoing left elbow pain and swelling, finally went to ED nov 17 with findings of possible left radial head fracture and DJD.  Place eventually in a LUE wrap removed about 1 wk ago, but has new swelling of the arm from mid humerus to distal, without pain or redness.  Good compliance with coumadin, no prior hx of DVT.  Swelling just not betting better.   Bowels improved with tid psyllium.  ALso has bilat hearing loss with wax impactions symptomatic for 2 wks.  No other fever, ear pain, HA or sinus symptoms worsening Past Medical History:  Diagnosis Date  . ABUSE, ALCOHOL, IN REMISSION 04/08/2007  . ALLERGIC RHINITIS 09/29/2006  . Arthritis    "hands" (07/02/2016)  . Atrial fibrillation ([place])   . Atrial fibrillation with RVR ([place]) 07/02/2016  . Atrial flutter ([place]) 03/28/2010   a. s/p prior rfca;  b. recurrent paroxysmal flutter 04/2012;  c. pradaxa initiated 04/2012.  [name] BENIGN PROSTATIC HYPERTROPHY 09/29/2006  . BPPV (benign paroxysmal positional vertigo) 04/08/2007  . CAD (coronary artery disease)    a. LHC 2/17: EF 55-65%, LM 75, pLAD 75, oD1 75, oD2 65, D3 85, oLCx 99, oOM1 75, pRCA 25 >> CABG  . Carotid stenosis    a. Carotid US 2/17:  Bilateral ICA 1-39% ICA  . Chronic lower back pain   . COLONIC POLYPS, HX OF 06/23/2007  . 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 Diverticulosis   . ERECTILE DYSFUNCTION 09/29/2006  . GERD (gastroesophageal reflux disease)   . GLUCOSE INTOLERANCE 03/19/2010  . HYPERLIPIDEMIA 09/29/2006  . HYPERTENSION 09/29/2006  . Long term (current) use of anticoagulants 04/26/2010  . MELANOMA, MALIGNANT, SKIN NOS 09/29/2006   other skin cancers -no further melanoma  . OSTEOARTHROSIS NOS, OTHER [facility] SITE 09/29/2006  . Other specified forms of hearing loss 08/10/2009  . Pneumonia ~ 1969  . Presence of permanent cardiac pacemaker 06/02/2016  .  VENTRICULAR TACHYCARDIA 09/29/2006   Past Surgical History:  Procedure Laterality Date  . AV NODE ABLATION  07/02/2016  . AV NODE ABLATION N/A 07/02/2016   Procedure: AV Node Ablation;  Surgeon: Deboraha Sprang, MD;  Location: [place] CV LAB;  Service: Cardiovascular;  Laterality: N/A;  . CARDIAC CATHETERIZATION N/A 03/12/2015   Procedure: Left Heart Cath and Coronary Angiography;  Surgeon: [name], MD;  Location: [place] CV LAB;  Service: Cardiovascular;  Laterality: N/A;  . CARDIOVERSION N/A 12/19/2015   Procedure: CARDIOVERSION;  Surgeon: [name], MD;  Location: [facility] ENDOSCOPY;  Service: Cardiovascular;  Laterality: N/A;  . CARDIOVERSION N/A 03/24/2016   Procedure: CARDIOVERSION;  Surgeon: [name], MD;  Location: [place];  Service: Cardiovascular;  Laterality: N/A;  . CATARACT EXTRACTION W/ INTRAOCULAR LENS  IMPLANT, BILATERAL Bilateral   . CLIPPING OF ATRIAL APPENDAGE N/A 03/14/2015   Procedure: CLIPPING OF ATRIAL APPENDAGE;  Surgeon: [name], MD;  Location: [place];  Service: Open Heart Surgery;  Laterality: N/A;  . COLONOSCOPY WITH PROPOFOL N/A 12/08/2014   Procedure: COLONOSCOPY WITH PROPOFOL;  Surgeon: [name], MD;  Location: [facility] ENDOSCOPY;  Service: Endoscopy;  Laterality: N/A;  . CORONARY ARTERY BYPASS GRAFT N/A 03/14/2015   Procedure: CORONARY ARTERY BYPASS GRAFTING (CABG) x 4 (LIMA to LAD, SVG to DIAGONAL 2, SVG SEQUENTIALLY to OM1 and OM2);  Surgeon:  [name], MD;  Location: [place];  Service: Open Heart Surgery;  Laterality: N/A;  . INCISION AND DRAINAGE N/A 07/03/2016   Procedure: INCISION AND DRAINAGE of CHEST ABSCESS;  Surgeon: [name], MD;  Location: [place];  Service: Thoracic;  Laterality: N/A;  . INSERT / REPLACE / REMOVE PACEMAKER  06/02/2016  . JOINT REPLACEMENT    . KNEE ARTHROSCOPY Left   . MELANOMA EXCISION     "upper back"  . PACEMAKER IMPLANT N/A 06/02/2016   Procedure: Pacemaker Implant;  Surgeon:  Deboraha Sprang, MD;  Location: [place] CV LAB;  Service: Cardiovascular;  Laterality: N/A;  . SHOULDER OPEN ROTATOR CUFF REPAIR Left   . SKIN CANCER EXCISION Right    "cheek; real deep"  . TEE WITHOUT CARDIOVERSION N/A 03/14/2015   Procedure: TRANSESOPHAGEAL ECHOCARDIOGRAM (TEE);  Surgeon: [name], MD;  Location: [place];  Service: Open Heart Surgery;  Laterality: N/A;  . TEE WITHOUT CARDIOVERSION N/A 12/19/2015   Procedure: TRANSESOPHAGEAL ECHOCARDIOGRAM (TEE);  Surgeon: [name], MD;  Location: [place];  Service: Cardiovascular;  Laterality: N/A;  . TEE WITHOUT CARDIOVERSION N/A 03/24/2016   Procedure: TRANSESOPHAGEAL ECHOCARDIOGRAM (TEE);  Surgeon: [name], MD;  Location: [place];  Service: Cardiovascular;  Laterality: N/A;  . TONSILLECTOMY    . TOTAL KNEE ARTHROPLASTY Right     reports that he has quit smoking. His smoking use included cigarettes. He has a 52.00 pack-year smoking history. He has never used smokeless tobacco. He reports that he does not drink alcohol or use drugs. family history includes Diabetes in his brother; Esophageal cancer in his father. Allergies  Allergen Reactions  . Amiodarone Hcl Other (See Comments)    Patient reports photosensitivity  . Ace Inhibitors Cough   Current Outpatient Medications on File Prior to Visit  Medication Sig Dispense Refill  . atorvastatin (LIPITOR) 10 MG tablet Take 1 tablet (10 mg total) by mouth daily. 90 tablet 3  . Benzethonium Cl & Zinc Oxide 0.13 & 10 % KIT Apply topically.    [name] doxylamine, Sleep, (UNISOM) 25 MG tablet Take 25 mg by mouth at bedtime.    . fish oil-omega-3 fatty acids 1000 MG capsule Take 1 g by mouth every evening.     . fluticasone (FLONASE) 50 MCG/ACT nasal spray Place 2 sprays into both nostrils daily. 16 g 1  . HYDROcodone-acetaminophen (NORCO) 10-325 MG tablet Take 1 tablet by mouth daily as needed. 30 tablet 0  . Multiple Vitamin (MULTIVITAMIN) tablet Take 1 tablet by  mouth daily.      [name] omega-3 acid ethyl esters (LOVAZA) 1 g capsule Take by mouth.    . Psyllium (METAMUCIL PO) 1 TABLESPOON IN A GLASS OF WATER ONCE DAILY    . tamsulosin (FLOMAX) 0.4 MG CAPS capsule Take 1 capsule (0.4 mg total) by mouth daily. 90 capsule 3  . warfarin (COUMADIN) 5 MG tablet Take as directed by Coumadin clinic. 100 tablet 1  . ZINC OXIDE EX Apply 1 application topically as needed (itching).     No current facility-administered medications on file prior to visit.     Review of Systems  Constitutional: Negative for other unusual diaphoresis or sweats HENT: Negative for ear discharge or swelling Eyes: Negative for other worsening visual disturbances Respiratory: Negative for stridor or other swelling  Gastrointestinal: Negative for worsening distension or other blood Genitourinary: Negative for retention or other urinary change Musculoskeletal: Negative for other MSK pain or swelling Skin: Negative for  color change or other new lesions Neurological: Negative for worsening tremors and other numbness  Psychiatric/Behavioral: Negative for worsening agitation or other fatigue All other system neg per pt    Objective:   Physical Exam Blood pressure 118/76, pulse 88, temperature 97.6 F (36.4 C), temperature source Oral, height '5\' 10"'$  (1.778 m), weight 179 lb (81.2 kg), SpO2 97 %.  VS noted,  Constitutional: Pt appears in NAD HENT: Head: NCAT.  Right Ear: External ear normal.  Left Ear: External ear normal.  Eyes: . Pupils are equal, round, and reactive to light. Conjunctivae and EOM are normal Bilat canals with wax impactions resolved with irrigation Nose: without d/c or deformity Neck: Neck supple. Gross normal ROM Cardiovascular: Normal rate and regular rhythm.   Pulmonary/Chest: Effort normal and breath sounds without rales or wheezing.  LUE with diffuse 1-2+ swelling from mid humerous to hand, worst about the elbow Abd:  Soft, NT, ND, + BS, no  organomegaly Neurological: Pt is alert. At baseline orientation, motor grossly intact Skin: Skin is warm. No rashes, other new lesions, no LE edema Psychiatric: Pt behavior is normal without agitation  No other exam findings Lab Results  Component Value Date   WBC 6.6 06/30/2017   HGB 14.4 06/30/2017   HCT 42.5 06/30/2017   PLT 212.0 06/30/2017   GLUCOSE 102 (H) 11/17/2017   CHOL 113 06/30/2017   TRIG 43.0 06/30/2017   HDL 50.30 06/30/2017   LDLDIRECT 159.2 09/29/2006   LDLCALC 54 06/30/2017   ALT 28 06/30/2017   AST 27 06/30/2017   NA 136 11/17/2017   K 4.0 11/17/2017   CL 100 11/17/2017   CREATININE 0.69 11/17/2017   BUN 12 11/17/2017   CO2 30 11/17/2017   TSH 0.46 06/30/2017   PSA 3.75 06/12/2016   INR 2.5 12/29/2017   HGBA1C 5.8 06/30/2017      Assessment & Plan:

## 2018-01-05 NOTE — Assessment & Plan Note (Signed)
Recent onset, on coumadin with good compliance but cant r/o dvt - for LUE venous dopplers  Lab Results  Component Value Date   INR 2.5 12/29/2017   INR 2.2 11/17/2017   INR 2.0 10/06/2017   PROTIME 18.8 07/10/2008

## 2018-01-20 ENCOUNTER — Other Ambulatory Visit: Payer: Self-pay | Admitting: Internal Medicine

## 2018-01-20 MED ORDER — HYDROCODONE-ACETAMINOPHEN 10-325 MG PO TABS: 1.0000 | ORAL_TABLET | Freq: Every day | ORAL | 0 refills | Status: DC | PRN

## 2018-01-20 NOTE — Telephone Encounter (Signed)
Done erx 

## 2018-01-21 LAB — CUP PACEART REMOTE DEVICE CHECK
Battery Voltage: 3.03 V
Brady Statistic RV Percent Paced: 99.85 %
Date Time Interrogation Session: 20191017040920
Lead Channel Impedance Value: 361 Ohm
Lead Channel Pacing Threshold Pulse Width: 0.4 ms
Lead Channel Sensing Intrinsic Amplitude: 3.125 mV
Lead Channel Setting Pacing Amplitude: 2.5 V
MDC IDC LEAD IMPLANT DT: 20180430
MDC IDC LEAD LOCATION: 753860
MDC IDC MSMT BATTERY REMAINING LONGEVITY: 130 mo
MDC IDC MSMT LEADCHNL RV IMPEDANCE VALUE: 570 Ohm
MDC IDC MSMT LEADCHNL RV PACING THRESHOLD AMPLITUDE: 0.875 V
MDC IDC MSMT LEADCHNL RV SENSING INTR AMPL: 3.125 mV
MDC IDC PG IMPLANT DT: 20180430
MDC IDC SET LEADCHNL RV PACING PULSEWIDTH: 0.4 ms
MDC IDC SET LEADCHNL RV SENSING SENSITIVITY: 1.2 mV

## 2018-02-09 ENCOUNTER — Ambulatory Visit (INDEPENDENT_AMBULATORY_CARE_PROVIDER_SITE_OTHER): Payer: Medicare Other

## 2018-02-09 DIAGNOSIS — Z5181 Encounter for therapeutic drug level monitoring: Secondary | ICD-10-CM | POA: Diagnosis not present

## 2018-02-09 DIAGNOSIS — I4891 Unspecified atrial fibrillation: Secondary | ICD-10-CM | POA: Diagnosis not present

## 2018-02-09 LAB — POCT INR: INR: 2 (ref 2.0–3.0)

## 2018-02-09 NOTE — Patient Instructions (Signed)
Please continue on same dosage 1 tablet daily except 1/2 tablet on Tuesdays. Recheck in 6 weeks. Call us with any medication changes or concerns # [phone].

## 2018-02-19 ENCOUNTER — Encounter: Payer: Self-pay | Admitting: Cardiology

## 2018-02-20 ENCOUNTER — Other Ambulatory Visit: Payer: Self-pay | Admitting: Internal Medicine

## 2018-02-20 MED ORDER — HYDROCODONE-ACETAMINOPHEN 10-325 MG PO TABS: 1.0000 | ORAL_TABLET | Freq: Every day | ORAL | 0 refills | Status: DC | PRN

## 2018-02-20 NOTE — Telephone Encounter (Signed)
   LOV:01/05/18 NextOV:02/23/18 Last Filled/Quantity:01/20/18 30#

## 2018-02-20 NOTE — Telephone Encounter (Signed)
Done erx 

## 2018-02-22 ENCOUNTER — Ambulatory Visit (INDEPENDENT_AMBULATORY_CARE_PROVIDER_SITE_OTHER): Payer: Medicare Other

## 2018-02-22 DIAGNOSIS — R001 Bradycardia, unspecified: Secondary | ICD-10-CM

## 2018-02-22 DIAGNOSIS — I480 Paroxysmal atrial fibrillation: Secondary | ICD-10-CM

## 2018-02-23 ENCOUNTER — Ambulatory Visit (INDEPENDENT_AMBULATORY_CARE_PROVIDER_SITE_OTHER): Payer: Medicare Other | Admitting: Internal Medicine

## 2018-02-23 ENCOUNTER — Encounter: Payer: Self-pay | Admitting: Internal Medicine

## 2018-02-23 ENCOUNTER — Other Ambulatory Visit (INDEPENDENT_AMBULATORY_CARE_PROVIDER_SITE_OTHER): Payer: Medicare Other

## 2018-02-23 VITALS — BP 118/82 | HR 87 | Temp 97.7°F | Ht 70.0 in | Wt 172.0 lb

## 2018-02-23 DIAGNOSIS — Z0000 Encounter for general adult medical examination without abnormal findings: Secondary | ICD-10-CM | POA: Diagnosis not present

## 2018-02-23 LAB — CBC WITH DIFFERENTIAL/PLATELET
Basophils Absolute: 0 10*3/uL (ref 0.0–0.1)
Basophils Relative: 0.5 % (ref 0.0–3.0)
EOS PCT: 0.8 % (ref 0.0–5.0)
Eosinophils Absolute: 0.1 10*3/uL (ref 0.0–0.7)
HCT: 42.6 % (ref 39.0–52.0)
HEMOGLOBIN: 14.2 g/dL (ref 13.0–17.0)
LYMPHS ABS: 2.3 10*3/uL (ref 0.7–4.0)
LYMPHS PCT: 33.1 % (ref 12.0–46.0)
MCHC: 33.3 g/dL (ref 30.0–36.0)
MCV: 94.5 fl (ref 78.0–100.0)
MONOS PCT: 9.4 % (ref 3.0–12.0)
Monocytes Absolute: 0.7 10*3/uL (ref 0.1–1.0)
NEUTROS PCT: 56.2 % (ref 43.0–77.0)
Neutro Abs: 3.9 10*3/uL (ref 1.4–7.7)
Platelets: 207 10*3/uL (ref 150.0–400.0)
RBC: 4.51 Mil/uL (ref 4.22–5.81)
RDW: 13.9 % (ref 11.5–15.5)
WBC: 6.9 10*3/uL (ref 4.0–10.5)

## 2018-02-23 LAB — LIPID PANEL
CHOL/HDL RATIO: 2
Cholesterol: 118 mg/dL (ref 0–200)
HDL: 52.1 mg/dL (ref >39.00)
LDL Cholesterol: 57 mg/dL (ref 0–99)
NONHDL: 65.97
Triglycerides: 46 mg/dL (ref 0.0–149.0)
VLDL: 9.2 mg/dL (ref 0.0–40.0)

## 2018-02-23 LAB — CUP PACEART REMOTE DEVICE CHECK
Battery Remaining Longevity: 124 mo
Battery Voltage: 3.02 V
Brady Statistic RV Percent Paced: 99.71 %
Implantable Lead Location: 753860
Implantable Lead Model: 5076
Implantable Pulse Generator Implant Date: 20180430
Lead Channel Impedance Value: 513 Ohm
Lead Channel Pacing Threshold Pulse Width: 0.4 ms
Lead Channel Sensing Intrinsic Amplitude: 3.125 mV
Lead Channel Setting Pacing Amplitude: 2.5 V
Lead Channel Setting Sensing Sensitivity: 1.2 mV
MDC IDC LEAD IMPLANT DT: 20180430
MDC IDC MSMT LEADCHNL RV IMPEDANCE VALUE: 342 Ohm
MDC IDC MSMT LEADCHNL RV PACING THRESHOLD AMPLITUDE: 0.75 V
MDC IDC MSMT LEADCHNL RV SENSING INTR AMPL: 3.125 mV
MDC IDC SESS DTM: 20200120204205
MDC IDC SET LEADCHNL RV PACING PULSEWIDTH: 0.4 ms

## 2018-02-23 LAB — BASIC METABOLIC PANEL
BUN: 13 mg/dL (ref 6–23)
CO2: 30 meq/L (ref 19–32)
CREATININE: 0.76 mg/dL (ref 0.40–1.50)
Calcium: 9.4 mg/dL (ref 8.4–10.5)
Chloride: 99 mEq/L (ref 96–112)
GFR: 96.78 mL/min (ref >60.00)
GLUCOSE: 138 mg/dL — AB (ref 70–99)
Potassium: 3.9 mEq/L (ref 3.5–5.1)
Sodium: 135 mEq/L (ref 135–145)

## 2018-02-23 LAB — URINALYSIS, ROUTINE W REFLEX MICROSCOPIC
Bilirubin Urine: NEGATIVE
HGB URINE DIPSTICK: NEGATIVE
Ketones, ur: NEGATIVE
Leukocytes, UA: NEGATIVE
NITRITE: NEGATIVE
RBC / HPF: NONE SEEN (ref 0–?)
SPECIFIC GRAVITY, URINE: 1.015 (ref 1.000–1.030)
Total Protein, Urine: NEGATIVE
Urine Glucose: NEGATIVE
Urobilinogen, UA: 0.2 (ref 0.0–1.0)
WBC UA: NONE SEEN (ref 0–?)
pH: 5.5 (ref 5.0–8.0)

## 2018-02-23 LAB — TSH: TSH: 1.06 u[IU]/mL (ref 0.35–4.50)

## 2018-02-23 LAB — HEPATIC FUNCTION PANEL
ALBUMIN: 4.1 g/dL (ref 3.5–5.2)
ALK PHOS: 106 U/L (ref 39–117)
ALT: 23 U/L (ref 0–53)
AST: 25 U/L (ref 0–37)
Bilirubin, Direct: 0.2 mg/dL (ref 0.0–0.3)
Total Bilirubin: 1 mg/dL (ref 0.2–1.2)
Total Protein: 7.1 g/dL (ref 6.0–8.3)

## 2018-02-23 NOTE — Progress Notes (Signed)
Subjective:    Patient ID: Jonathan Orozco, male    DOB: Jan 23, 1929, 85 y.o.   MRN: [id number]  HPI  Here for wellness and f/u;  Overall doing ok;  Pt denies Chest pain, worsening SOB, DOE, wheezing, orthopnea, PND, worsening LE edema, palpitations, dizziness or syncope.  Pt denies neurological change such as new headache, facial or extremity weakness.  Pt denies polydipsia, polyuria, or low sugar symptoms. Pt states overall good compliance with treatment and medications, good tolerability, and has been trying to follow appropriate diet.  Pt denies worsening depressive symptoms, suicidal ideation or panic. No fever, night sweats, wt loss, loss of appetite, or other constitutional symptoms.  Pt states good ability with ADL's, has low fall risk, home safety reviewed and adequate, no other significant changes in hearing or vision Having some worsening balance, walks with cane, does not go to gym often at all like he used to.  Does c/o ongoing fatigue, but denies signficant daytime hypersomnolence. Past Medical History:  Diagnosis Date  . ABUSE, ALCOHOL, IN REMISSION 04/08/2007  . ALLERGIC RHINITIS 09/29/2006  . Arthritis    "hands" (07/02/2016)  . Atrial fibrillation ([place])   . Atrial fibrillation with RVR ([place]) 07/02/2016  . Atrial flutter ([place]) 03/28/2010   a. s/p prior rfca;  b. recurrent paroxysmal flutter 04/2012;  c. pradaxa initiated 04/2012.  [name] BENIGN PROSTATIC HYPERTROPHY 09/29/2006  . BPPV (benign paroxysmal positional vertigo) 04/08/2007  . CAD (coronary artery disease)    a. LHC 2/17: EF 55-65%, LM 75, pLAD 75, oD1 75, oD2 65, D3 85, oLCx 99, oOM1 75, pRCA 25 >> CABG  . Carotid stenosis    a. Carotid US 2/17:  Bilateral ICA 1-39% ICA  . Chronic lower back pain   . COLONIC POLYPS, HX OF 06/23/2007  . 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 Diverticulosis   . ERECTILE DYSFUNCTION 09/29/2006  . GERD (gastroesophageal reflux disease)   . GLUCOSE INTOLERANCE 03/19/2010  . HYPERLIPIDEMIA  09/29/2006  . HYPERTENSION 09/29/2006  . Long term (current) use of anticoagulants 04/26/2010  . MELANOMA, MALIGNANT, SKIN NOS 09/29/2006   other skin cancers -no further melanoma  . OSTEOARTHROSIS NOS, OTHER [facility] SITE 09/29/2006  . Other specified forms of hearing loss 08/10/2009  . Pneumonia ~ 1969  . Presence of permanent cardiac pacemaker 06/02/2016  . VENTRICULAR TACHYCARDIA 09/29/2006   Past Surgical History:  Procedure Laterality Date  . AV NODE ABLATION  07/02/2016  . AV NODE ABLATION N/A 07/02/2016   Procedure: AV Node Ablation;  Surgeon: Deboraha Sprang, MD;  Location: [place] [facility];  Service: Cardiovascular;  Laterality: N/A;  . CARDIAC CATHETERIZATION N/A 03/12/2015   Procedure: Left Heart Cath and Coronary Angiography;  Surgeon: [name], MD;  Location: [place] [facility];  Service: Cardiovascular;  Laterality: N/A;  . CARDIOVERSION N/A 12/19/2015   Procedure: CARDIOVERSION;  Surgeon: [name], MD;  Location: [facility] ENDOSCOPY;  Service: Cardiovascular;  Laterality: N/A;  . CARDIOVERSION N/A 03/24/2016   Procedure: CARDIOVERSION;  Surgeon: [name], MD;  Location: [place];  Service: Cardiovascular;  Laterality: N/A;  . CATARACT EXTRACTION W/ INTRAOCULAR LENS  IMPLANT, BILATERAL Bilateral   . CLIPPING OF ATRIAL APPENDAGE N/A 03/14/2015   Procedure: CLIPPING OF ATRIAL APPENDAGE;  Surgeon: [name], MD;  Location: [place];  Service: Open Heart Surgery;  Laterality: N/A;  . COLONOSCOPY WITH PROPOFOL N/A 12/08/2014   Procedure: COLONOSCOPY WITH PROPOFOL;  Surgeon: [name], MD;  Location: [facility]  ENDOS[facility];  Service: Endoscopy;  Laterality: N/A;  . CORONARY ARTERY BYPASS GRAFT N/A 03/14/2015   Procedure: CORONARY ARTERY BYPASS GRAFTING (CABG) x 4 (LIMA to LAD, SVG to DIAGONAL 2, SVG SEQUENTIALLY to OM1 and OM2);  Surgeon: [name], MD;  Location: [place];  Service: Open Heart Surgery;  Laterality: N/A;  . INCISION AND DRAINAGE N/A 07/03/2016   Procedure:  INCISION AND DRAINAGE of CHEST ABSCESS;  Surgeon: [name], MD;  Location: [place];  Service: Thoracic;  Laterality: N/A;  . INSERT / REPLACE / REMOVE PACEMAKER  06/02/2016  . JOINT REPLACEMENT    . KNEE ARTHROSCOPY Left   . MELANOMA EXCISION     "upper back"  . PACEMAKER IMPLANT N/A 06/02/2016   Procedure: Pacemaker Implant;  Surgeon: Deboraha Sprang, MD;  Location: [place] [facility];  Service: Cardiovascular;  Laterality: N/A;  . SHOULDER OPEN ROTATOR CUFF REPAIR Left   . SKIN CANCER EXCISION Right    "cheek; real deep"  . TEE WITHOUT CARDIOVERSION N/A 03/14/2015   Procedure: TRANSESOPHAGEAL ECHOCARDIOGRAM (TEE);  Surgeon: [name], MD;  Location: [place];  Service: Open Heart Surgery;  Laterality: N/A;  . TEE WITHOUT CARDIOVERSION N/A 12/19/2015   Procedure: TRANSESOPHAGEAL ECHOCARDIOGRAM (TEE);  Surgeon: [name], MD;  Location: [place];  Service: Cardiovascular;  Laterality: N/A;  . TEE WITHOUT CARDIOVERSION N/A 03/24/2016   Procedure: TRANSESOPHAGEAL ECHOCARDIOGRAM (TEE);  Surgeon: [name], MD;  Location: [place];  Service: Cardiovascular;  Laterality: N/A;  . TONSILLECTOMY    . TOTAL KNEE ARTHROPLASTY Right     reports that he has quit smoking. His smoking use included cigarettes. He has a 52.00 pack-year smoking history. He has never used smokeless tobacco. He reports that he does not drink alcohol or use drugs. family history includes Diabetes in his brother; Esophageal cancer in his father. Allergies  Allergen Reactions  . Amiodarone Hcl Other (See Comments)    Patient reports photosensitivity  . Ace Inhibitors Cough   Current Outpatient Medications on File Prior to Visit  Medication Sig Dispense Refill  . atorvastatin (LIPITOR) 10 MG tablet Take 1 tablet (10 mg total) by mouth daily. 90 tablet 3  . Benzethonium Cl & Zinc Oxide 0.13 & 10 % KIT Apply topically.    [name] doxylamine, Sleep, (UNISOM) 25 MG tablet Take 25 mg by mouth at  bedtime.    . fish oil-omega-3 fatty acids 1000 MG capsule Take 1 g by mouth every evening.     . fluticasone (FLONASE) 50 MCG/ACT nasal spray Place 2 sprays into both nostrils daily. 16 g 1  . HYDROcodone-acetaminophen (NORCO) 10-325 MG tablet Take 1 tablet by mouth daily as needed. 30 tablet 0  . Multiple Vitamin (MULTIVITAMIN) tablet Take 1 tablet by mouth daily.      [name] omega-3 acid ethyl esters (LOVAZA) 1 g capsule Take by mouth.    . Psyllium (METAMUCIL PO) 1 TABLESPOON IN A GLASS OF WATER ONCE DAILY    . tamsulosin (FLOMAX) 0.4 MG CAPS capsule Take 1 capsule (0.4 mg total) by mouth daily. 90 capsule 3  . warfarin (COUMADIN) 5 MG tablet Take as directed by Coumadin clinic. 100 tablet 1  . ZINC OXIDE EX Apply 1 application topically as needed (itching).     No current facility-administered medications on file prior to visit.    Review of Systems Constitutional: Negative for other unusual diaphoresis, sweats, appetite or weight changes HENT: Negative for other worsening hearing loss, ear  pain, facial swelling, mouth sores or neck stiffness.   Eyes: Negative for other worsening pain, redness or other visual disturbance.  Respiratory: Negative for other stridor or swelling Cardiovascular: Negative for other palpitations or other chest pain  Gastrointestinal: Negative for worsening diarrhea or loose stools, blood in stool, distention or other pain Genitourinary: Negative for hematuria, flank pain or other change in urine volume.  Musculoskeletal: Negative for myalgias or other joint swelling.  Skin: Negative for other color change, or other wound or worsening drainage.  Neurological: Negative for other syncope or numbness. Hematological: Negative for other adenopathy or swelling Psychiatric/Behavioral: Negative for hallucinations, other worsening agitation, SI, self-injury, or new decreased concentration All other system neg per pt    Objective:   Physical Exam BP 118/82   Pulse 87    Temp 97.7 F (36.5 C) (Oral)   Ht '5\' 10"'$  (1.778 m)   Wt 172 lb (78 kg)   SpO2 98%   BMI 24.68 kg/m  VS noted,  Constitutional: Pt is oriented to person, place, and time. Appears well-developed and well-nourished, in no significant distress and comfortable Head: Normocephalic and atraumatic  Eyes: Conjunctivae and EOM are normal. Pupils are equal, round, and reactive to light Right Ear: External ear normal without discharge Left Ear: External ear normal without discharge Nose: Nose without discharge or deformity Mouth/Throat: Oropharynx is without other ulcerations and moist  Neck: Normal range of motion. Neck supple. No JVD present. No tracheal deviation present or significant neck LA or mass Cardiovascular: Normal rate, regular rhythm, normal heart sounds and intact distal pulses.   Pulmonary/Chest: WOB normal and breath sounds without rales or wheezing  Abdominal: Soft. Bowel sounds are normal. NT. No HSM  Musculoskeletal: Normal range of motion. Exhibits no edema Lymphadenopathy: Has no other cervical adenopathy.  Neurological: Pt is alert and oriented to person, place, and time. Pt has normal reflexes. No cranial nerve deficit. Motor grossly intact, Gait intact Skin: Skin is warm and dry. No rash noted or new ulcerations Psychiatric:  Has normal mood and affect. Behavior is normal without agitation No other exam findings  Lab Results  Component Value Date   WBC 6.6 06/30/2017   HGB 14.4 06/30/2017   HCT 42.5 06/30/2017   PLT 212.0 06/30/2017   GLUCOSE 102 (H) 11/17/2017   CHOL 113 06/30/2017   TRIG 43.0 06/30/2017   HDL 50.30 06/30/2017   LDLDIRECT 159.2 09/29/2006   LDLCALC 54 06/30/2017   ALT 28 06/30/2017   AST 27 06/30/2017   NA 136 11/17/2017   K 4.0 11/17/2017   CL 100 11/17/2017   CREATININE 0.69 11/17/2017   BUN 12 11/17/2017   CO2 30 11/17/2017   TSH 0.46 06/30/2017   PSA 3.75 06/12/2016   INR 2.0 02/09/2018   HGBA1C 5.8 06/30/2017        Assessment  & Plan:

## 2018-02-23 NOTE — Patient Instructions (Signed)

## 2018-02-23 NOTE — Progress Notes (Signed)
Remote pacemaker transmission.   

## 2018-02-23 NOTE — Assessment & Plan Note (Signed)

## 2018-02-24 ENCOUNTER — Other Ambulatory Visit (INDEPENDENT_AMBULATORY_CARE_PROVIDER_SITE_OTHER): Payer: Medicare Other

## 2018-02-24 DIAGNOSIS — R739 Hyperglycemia, unspecified: Secondary | ICD-10-CM | POA: Diagnosis not present

## 2018-02-24 LAB — HEMOGLOBIN A1C: HEMOGLOBIN A1C: 5.9 % (ref 4.6–6.5)

## 2018-03-23 ENCOUNTER — Ambulatory Visit (INDEPENDENT_AMBULATORY_CARE_PROVIDER_SITE_OTHER): Payer: Medicare Other | Admitting: *Deleted

## 2018-03-23 ENCOUNTER — Other Ambulatory Visit: Payer: Self-pay | Admitting: Internal Medicine

## 2018-03-23 DIAGNOSIS — I4891 Unspecified atrial fibrillation: Secondary | ICD-10-CM | POA: Diagnosis not present

## 2018-03-23 DIAGNOSIS — Z5181 Encounter for therapeutic drug level monitoring: Secondary | ICD-10-CM | POA: Diagnosis not present

## 2018-03-23 LAB — POCT INR: INR: 1.7 — AB (ref 2.0–3.0)

## 2018-03-23 MED ORDER — WARFARIN SODIUM 5 MG PO TABS: ORAL_TABLET | ORAL | 1 refills | Status: DC

## 2018-03-23 MED ORDER — HYDROCODONE-ACETAMINOPHEN 10-325 MG PO TABS: 1.0000 | ORAL_TABLET | Freq: Every day | ORAL | 0 refills | Status: DC | PRN

## 2018-03-23 NOTE — Patient Instructions (Signed)
Description   Today take 1 tablet then continue on same dosage 1 tablet daily except 1/2 tablet on Tuesdays. Recheck in 3 weeks. Call us with any medication changes or concerns # [phone].

## 2018-03-23 NOTE — Telephone Encounter (Signed)
Done erx 

## 2018-04-13 ENCOUNTER — Ambulatory Visit (INDEPENDENT_AMBULATORY_CARE_PROVIDER_SITE_OTHER): Payer: Medicare Other

## 2018-04-13 DIAGNOSIS — I4891 Unspecified atrial fibrillation: Secondary | ICD-10-CM

## 2018-04-13 DIAGNOSIS — Z5181 Encounter for therapeutic drug level monitoring: Secondary | ICD-10-CM | POA: Diagnosis not present

## 2018-04-13 LAB — POCT INR: INR: 1.7 — AB (ref 2.0–3.0)

## 2018-04-13 NOTE — Patient Instructions (Signed)
Description   Start taking 1 tablet daily.  Recheck in 2 weeks. Call us with any medication changes or concerns # [phone].

## 2018-04-21 ENCOUNTER — Other Ambulatory Visit: Payer: Self-pay | Admitting: Internal Medicine

## 2018-04-21 MED ORDER — HYDROCODONE-ACETAMINOPHEN 10-325 MG PO TABS: 1.0000 | ORAL_TABLET | Freq: Every day | ORAL | 0 refills | Status: DC | PRN

## 2018-04-21 NOTE — Telephone Encounter (Signed)
Done erx 

## 2018-04-22 ENCOUNTER — Other Ambulatory Visit: Payer: Self-pay | Admitting: Internal Medicine

## 2018-04-22 NOTE — Telephone Encounter (Signed)
vicodin already refilled mar 18

## 2018-04-26 ENCOUNTER — Telehealth: Payer: Self-pay | Admitting: Internal Medicine

## 2018-04-26 ENCOUNTER — Telehealth: Payer: Self-pay

## 2018-04-26 NOTE — Telephone Encounter (Signed)
  Wife is calling to get advice on which company to order a PT/INR meter for Jonathan Orozco to use at home. They will also need a note from Dr [name] stating the patients need for it.

## 2018-04-26 NOTE — Telephone Encounter (Signed)

## 2018-04-26 NOTE — Telephone Encounter (Signed)
Returned call to the pt and wife and advised them that at this time we will not be permitting the pt to have INR testing at home. She states they will be fine to continue coming in for appts and utilizing the drive in service that we are doing. Advised if his PCP is willing to order and dose there readings that they can as long as they manage it, wife states "No, we will continue to follow with [place]. She is aware of the drive up service tomorrow and appreciative of the info.

## 2018-04-27 ENCOUNTER — Other Ambulatory Visit: Payer: Self-pay

## 2018-04-27 ENCOUNTER — Ambulatory Visit (INDEPENDENT_AMBULATORY_CARE_PROVIDER_SITE_OTHER): Payer: Medicare Other | Admitting: *Deleted

## 2018-04-27 DIAGNOSIS — I4891 Unspecified atrial fibrillation: Secondary | ICD-10-CM

## 2018-04-27 DIAGNOSIS — Z5181 Encounter for therapeutic drug level monitoring: Secondary | ICD-10-CM

## 2018-04-27 LAB — POCT INR: INR: 1.6 — AB (ref 2.0–3.0)

## 2018-04-27 NOTE — Patient Instructions (Signed)
Description   Today take 1.5 tablets then start taking 1 tablet daily except 1.5 tablets on Sundays. Recheck in 2 weeks. Call us with any medication changes or concerns # [phone].

## 2018-05-10 ENCOUNTER — Telehealth: Payer: Self-pay

## 2018-05-10 NOTE — Telephone Encounter (Signed)

## 2018-05-11 ENCOUNTER — Ambulatory Visit (INDEPENDENT_AMBULATORY_CARE_PROVIDER_SITE_OTHER): Payer: Medicare Other | Admitting: Pharmacist Clinician (PhC)/ Clinical Pharmacy Specialist

## 2018-05-11 ENCOUNTER — Other Ambulatory Visit: Payer: Self-pay

## 2018-05-11 DIAGNOSIS — I4891 Unspecified atrial fibrillation: Secondary | ICD-10-CM

## 2018-05-11 DIAGNOSIS — Z5181 Encounter for therapeutic drug level monitoring: Secondary | ICD-10-CM

## 2018-05-11 LAB — POCT INR: INR: 2.2 (ref 2.0–3.0)

## 2018-05-22 ENCOUNTER — Other Ambulatory Visit: Payer: Self-pay | Admitting: Internal Medicine

## 2018-05-24 ENCOUNTER — Other Ambulatory Visit: Payer: Self-pay

## 2018-05-24 ENCOUNTER — Ambulatory Visit (INDEPENDENT_AMBULATORY_CARE_PROVIDER_SITE_OTHER): Payer: Medicare Other | Admitting: *Deleted

## 2018-05-24 DIAGNOSIS — I4891 Unspecified atrial fibrillation: Secondary | ICD-10-CM

## 2018-05-24 LAB — CUP PACEART REMOTE DEVICE CHECK
Battery Remaining Longevity: 122 mo
Battery Voltage: 3.02 V
Brady Statistic RV Percent Paced: 98.88 %
Date Time Interrogation Session: 20200420124441
Implantable Lead Implant Date: 20180430
Implantable Lead Location: 753860
Implantable Lead Model: 5076
Implantable Pulse Generator Implant Date: 20180430
Lead Channel Impedance Value: 342 Ohm
Lead Channel Impedance Value: 532 Ohm
Lead Channel Pacing Threshold Amplitude: 0.75 V
Lead Channel Pacing Threshold Pulse Width: 0.4 ms
Lead Channel Sensing Intrinsic Amplitude: 5.875 mV
Lead Channel Sensing Intrinsic Amplitude: 5.875 mV
Lead Channel Setting Pacing Amplitude: 2.5 V
Lead Channel Setting Pacing Pulse Width: 0.4 ms
Lead Channel Setting Sensing Sensitivity: 1.2 mV

## 2018-05-24 MED ORDER — HYDROCODONE-ACETAMINOPHEN 10-325 MG PO TABS: 1.0000 | ORAL_TABLET | Freq: Every day | ORAL | 0 refills | Status: DC | PRN

## 2018-05-24 NOTE — Telephone Encounter (Signed)
Done erx 

## 2018-05-31 ENCOUNTER — Telehealth: Payer: Self-pay

## 2018-05-31 NOTE — Telephone Encounter (Signed)
lmom for prescreen  

## 2018-05-31 NOTE — Telephone Encounter (Signed)

## 2018-06-01 ENCOUNTER — Other Ambulatory Visit: Payer: Self-pay

## 2018-06-01 ENCOUNTER — Ambulatory Visit (INDEPENDENT_AMBULATORY_CARE_PROVIDER_SITE_OTHER): Payer: Medicare Other | Admitting: Pharmacist

## 2018-06-01 DIAGNOSIS — I4891 Unspecified atrial fibrillation: Secondary | ICD-10-CM | POA: Diagnosis not present

## 2018-06-01 DIAGNOSIS — Z5181 Encounter for therapeutic drug level monitoring: Secondary | ICD-10-CM | POA: Diagnosis not present

## 2018-06-01 LAB — POCT INR: INR: 3.1 — AB (ref 2.0–3.0)

## 2018-06-01 MED ORDER — APIXABAN 5 MG PO TABS: 5.0000 mg | ORAL_TABLET | Freq: Two times a day (BID) | ORAL | 1 refills | Status: DC

## 2018-06-02 ENCOUNTER — Telehealth: Payer: Self-pay

## 2018-06-02 ENCOUNTER — Encounter: Payer: Self-pay | Admitting: Cardiology

## 2018-06-02 NOTE — Telephone Encounter (Signed)
**Note De-Identified Cortlynn Hollinsworth Obfuscation** I called [facility] and did an Eliquis PA over the phone with Dwyane Luo. Per Dwyane Luo this PA has been approved from 05/03/2018 until 06/02/2019.  She states that they are faxing the approval fax to the office and will notify the pt and his pharmacy of this approval.

## 2018-06-02 NOTE — Progress Notes (Signed)
Remote pacemaker transmission.   

## 2018-06-02 NOTE — Telephone Encounter (Signed)
Approval letter from [facility] received via fax. Case ID: [id number]

## 2018-06-23 ENCOUNTER — Telehealth: Payer: Self-pay

## 2018-06-23 ENCOUNTER — Other Ambulatory Visit: Payer: Self-pay | Admitting: Internal Medicine

## 2018-06-23 MED ORDER — HYDROCODONE-ACETAMINOPHEN 10-325 MG PO TABS: 1.0000 | ORAL_TABLET | Freq: Every day | ORAL | 0 refills | Status: DC | PRN

## 2018-06-23 NOTE — Telephone Encounter (Signed)
Med canceled at [place] location.

## 2018-06-23 NOTE — Telephone Encounter (Signed)
Done erx 

## 2018-06-23 NOTE — Telephone Encounter (Signed)
Ok this is done  Ok to cancel the prior [facility]

## 2018-06-23 NOTE — Addendum Note (Signed)
Addended by: Biagio Borg on: 06/23/2018 10:51 AM   Modules accepted: Orders

## 2018-06-23 NOTE — Telephone Encounter (Signed)
Copied from [place] [id number]. Topic: General - Inquiry >> Jun 23, 2018 10:22 AM Richardo Priest, NT wrote: Reason for CRM: Patient calling in wanting to switch pharmacy for the  HYDROcodone-acetaminophen [facility]) 10-325 MG tablet , to the [facility] neighborhood store [address] [phone].

## 2018-07-02 ENCOUNTER — Encounter: Payer: Medicare Other | Admitting: Internal Medicine

## 2018-07-02 ENCOUNTER — Ambulatory Visit (INDEPENDENT_AMBULATORY_CARE_PROVIDER_SITE_OTHER): Payer: Medicare Other | Admitting: Internal Medicine

## 2018-07-02 DIAGNOSIS — E785 Hyperlipidemia, unspecified: Secondary | ICD-10-CM | POA: Diagnosis not present

## 2018-07-02 DIAGNOSIS — F411 Generalized anxiety disorder: Secondary | ICD-10-CM

## 2018-07-02 DIAGNOSIS — M545 Low back pain, unspecified: Secondary | ICD-10-CM

## 2018-07-02 DIAGNOSIS — R739 Hyperglycemia, unspecified: Secondary | ICD-10-CM | POA: Diagnosis not present

## 2018-07-02 DIAGNOSIS — G8929 Other chronic pain: Secondary | ICD-10-CM

## 2018-07-02 MED ORDER — TRIAMCINOLONE ACETONIDE 55 MCG/ACT NA AERO: 2.0000 | INHALATION_SPRAY | Freq: Every day | NASAL | 12 refills | Status: DC

## 2018-07-02 NOTE — Progress Notes (Signed)
Patient ID: Jonathan Orozco, male   DOB: 1928-07-03, 85 y.o.   MRN: [id number]  Virtual Visit via Video Note  I connected with Jonathan Orozco on 07/02/18 at  1:20 PM EDT by a video enabled telemedicine application and verified that I am speaking with the correct person using two identifiers.  Location: Patient: at home with wife off camera to assist Provider: at office I discussed the limitations of evaluation and management by telemedicine and the availability of in person appointments. The patient expressed understanding and agreed to proceed.  History of Present Illness: Here to f/u; overall doing ok,  Pt denies chest pain, increasing sob or doe, wheezing, orthopnea, PND, increased LE swelling, palpitations, dizziness or syncope.  Pt denies new neurological symptoms such as new headache, or facial or extremity weakness or numbness.  Pt denies polydipsia, polyuria, or low sugar episode.  Pt states overall good compliance with meds, mostly trying to follow appropriate diet, with wt overall stable,  but little exercise however.  Has developed some increased off balance in past few wks and plans to start PT excercises he has learned from last PT episode, declines further PT at home for now. Denies worsening depressive symptoms, suicidal ideation, or panic.  Pt continues to have recurring LBP without change in severity, bowel or bladder change, fever, wt loss,  worsening LE pain/numbness/weakness, gait change or falls.   Observations/Objective: Alert, NAD, appropriate mood and affect, resps normal, cn 2-12 intact, moves all 4s, no visible rash or swelling Lab Results  Component Value Date   WBC 6.9 02/23/2018   HGB 14.2 02/23/2018   HCT 42.6 02/23/2018   PLT 207.0 02/23/2018   GLUCOSE 138 (H) 02/23/2018   CHOL 118 02/23/2018   TRIG 46.0 02/23/2018   HDL 52.10 02/23/2018   LDLDIRECT 159.2 09/29/2006   LDLCALC 57 02/23/2018   ALT 23 02/23/2018   AST 25 02/23/2018   NA 135 02/23/2018   K 3.9  02/23/2018   CL 99 02/23/2018   CREATININE 0.76 02/23/2018   BUN 13 02/23/2018   CO2 30 02/23/2018   TSH 1.06 02/23/2018   PSA 3.75 06/12/2016   INR 3.1 (A) 06/01/2018   HGBA1C 5.9 02/24/2018    Assessment and Plan: See notes  Follow Up Instructions: See notes   I discussed the assessment and treatment plan with the patient. The patient was provided an opportunity to ask questions and all were answered. The patient agreed with the plan and demonstrated an understanding of the instructions.   The patient was advised to call back or seek an in-person evaluation if the symptoms worsen or if the condition fails to improve as anticipated.  [name], MD

## 2018-07-03 ENCOUNTER — Encounter: Payer: Self-pay | Admitting: Internal Medicine

## 2018-07-03 NOTE — Assessment & Plan Note (Signed)
stable overall by history and exam, recent data reviewed with pt, and pt to continue medical treatment as before,  to f/u any worsening symptoms or concerns, for lipids with labs 

## 2018-07-03 NOTE — Assessment & Plan Note (Signed)
stable overall by history and exam, recent data reviewed with pt, and pt to continue medical treatment as before,  to f/u any worsening symptoms or concerns  

## 2018-07-03 NOTE — Patient Instructions (Addendum)
Please continue all other medications as before, and refills have been done if requested.  Please have the pharmacy call with any other refills you may need.  Please continue your efforts at being more active, low cholesterol diet, and weight control.  Please keep your appointments with your specialists as you may have planned  Please return in 6 months, or sooner if needed 

## 2018-07-23 ENCOUNTER — Telehealth: Payer: Self-pay | Admitting: Internal Medicine

## 2018-07-23 MED ORDER — TAMSULOSIN HCL 0.4 MG PO CAPS: 0.4000 mg | ORAL_CAPSULE | Freq: Every day | ORAL | 0 refills | Status: DC

## 2018-07-23 NOTE — Telephone Encounter (Signed)
Medication: tamsulosin (FLOMAX) 0.4 MG CAPS capsule   Patient is requesting a refill of this medication with 90 day supply.   Pharmacy:  [facility] - [place], [place] - [address] [phone] (Phone) [phone] (Fax)

## 2018-07-26 ENCOUNTER — Other Ambulatory Visit: Payer: Self-pay | Admitting: Internal Medicine

## 2018-07-26 MED ORDER — HYDROCODONE-ACETAMINOPHEN 10-325 MG PO TABS: 1.0000 | ORAL_TABLET | Freq: Every day | ORAL | 0 refills | Status: DC | PRN

## 2018-07-26 NOTE — Telephone Encounter (Signed)
Done erx 

## 2018-07-26 NOTE — Telephone Encounter (Signed)
Medication Refill - Medication: HYDROcodone-acetaminophen (NORCO) 10-325 MG tablet    Has the patient contacted their pharmacy? No. (Agent: If no, request that the patient contact the pharmacy for the refill.) (Agent: If yes, when and what did the pharmacy advise?)  Preferred Pharmacy (with phone number [facility] street [facility]    Agent: Please be advised that RX refills may take up to 3 business days. We ask that you follow-up with your pharmacy.

## 2018-07-26 NOTE — Addendum Note (Signed)
Addended by: Biagio Borg on: 07/26/2018 01:11 PM   Modules accepted: Orders

## 2018-07-28 MED ORDER — HYDROCODONE-ACETAMINOPHEN 10-325 MG PO TABS: 1.0000 | ORAL_TABLET | Freq: Every day | ORAL | 0 refills | Status: DC | PRN

## 2018-07-28 NOTE — Telephone Encounter (Signed)
Done erx 

## 2018-07-28 NOTE — Addendum Note (Signed)
Addended by: Biagio Borg on: 07/28/2018 11:27 AM   Modules accepted: Orders

## 2018-07-28 NOTE — Telephone Encounter (Signed)
Pt said this script was suppose to go to [facility]. Please resend

## 2018-08-23 ENCOUNTER — Ambulatory Visit (INDEPENDENT_AMBULATORY_CARE_PROVIDER_SITE_OTHER): Payer: Medicare Other | Admitting: *Deleted

## 2018-08-23 DIAGNOSIS — I4891 Unspecified atrial fibrillation: Secondary | ICD-10-CM

## 2018-08-24 ENCOUNTER — Telehealth: Payer: Self-pay

## 2018-08-24 LAB — CUP PACEART REMOTE DEVICE CHECK
Battery Remaining Longevity: 121 mo
Battery Voltage: 3.01 V
Brady Statistic RV Percent Paced: 94.06 %
Date Time Interrogation Session: 20200721162035
Implantable Lead Implant Date: 20180430
Implantable Lead Location: 753860
Implantable Lead Model: 5076
Implantable Pulse Generator Implant Date: 20180430
Lead Channel Impedance Value: 361 Ohm
Lead Channel Impedance Value: 570 Ohm
Lead Channel Pacing Threshold Amplitude: 0.875 V
Lead Channel Pacing Threshold Pulse Width: 0.4 ms
Lead Channel Sensing Intrinsic Amplitude: 3.125 mV
Lead Channel Sensing Intrinsic Amplitude: 3.125 mV
Lead Channel Setting Pacing Amplitude: 2.5 V
Lead Channel Setting Pacing Pulse Width: 0.4 ms
Lead Channel Setting Sensing Sensitivity: 1.2 mV

## 2018-08-24 NOTE — Telephone Encounter (Signed)
Left message for patient to remind of missed remote transmission.  

## 2018-08-27 ENCOUNTER — Other Ambulatory Visit: Payer: Self-pay | Admitting: Internal Medicine

## 2018-08-27 MED ORDER — HYDROCODONE-ACETAMINOPHEN 10-325 MG PO TABS: 1.0000 | ORAL_TABLET | Freq: Every day | ORAL | 0 refills | Status: DC | PRN

## 2018-08-27 NOTE — Telephone Encounter (Signed)
Done erx 

## 2018-09-08 ENCOUNTER — Encounter: Payer: Self-pay | Admitting: Cardiology

## 2018-09-08 NOTE — Progress Notes (Signed)
Remote pacemaker transmission.   

## 2018-09-24 ENCOUNTER — Telehealth: Payer: Self-pay | Admitting: Internal Medicine

## 2018-09-24 NOTE — Telephone Encounter (Signed)
LVM for return call and sent a MyChart message as well.

## 2018-09-24 NOTE — Telephone Encounter (Signed)
Patient's wife called stated that he cut his arm about a week ago, they bandage his arm, they are changing the bandage every couple of days. She states some it looks like it healing, but some of the area is still bleeding. Patient is on apixaban (ELIQUIS) 5 MG TABS tablet 2x times a day.  She wants to know if maybe he needs to adjust his medication for a day or two.

## 2018-09-27 ENCOUNTER — Other Ambulatory Visit: Payer: Self-pay | Admitting: Internal Medicine

## 2018-09-28 MED ORDER — HYDROCODONE-ACETAMINOPHEN 10-325 MG PO TABS: 1.0000 | ORAL_TABLET | Freq: Every day | ORAL | 0 refills | Status: DC | PRN

## 2018-09-28 NOTE — Telephone Encounter (Signed)
Done erx 

## 2018-10-08 ENCOUNTER — Other Ambulatory Visit: Payer: Self-pay

## 2018-10-08 ENCOUNTER — Ambulatory Visit (INDEPENDENT_AMBULATORY_CARE_PROVIDER_SITE_OTHER): Payer: Medicare Other

## 2018-10-08 DIAGNOSIS — Z23 Encounter for immunization: Secondary | ICD-10-CM

## 2018-10-19 ENCOUNTER — Encounter: Payer: Self-pay | Admitting: Internal Medicine

## 2018-10-19 ENCOUNTER — Ambulatory Visit (INDEPENDENT_AMBULATORY_CARE_PROVIDER_SITE_OTHER): Payer: Medicare Other | Admitting: Internal Medicine

## 2018-10-19 ENCOUNTER — Other Ambulatory Visit (INDEPENDENT_AMBULATORY_CARE_PROVIDER_SITE_OTHER): Payer: Medicare Other

## 2018-10-19 ENCOUNTER — Other Ambulatory Visit: Payer: Self-pay

## 2018-10-19 VITALS — BP 120/76 | HR 85 | Temp 97.6°F | Wt 173.0 lb

## 2018-10-19 DIAGNOSIS — E559 Vitamin D deficiency, unspecified: Secondary | ICD-10-CM

## 2018-10-19 DIAGNOSIS — R739 Hyperglycemia, unspecified: Secondary | ICD-10-CM

## 2018-10-19 DIAGNOSIS — R5383 Other fatigue: Secondary | ICD-10-CM

## 2018-10-19 DIAGNOSIS — E611 Iron deficiency: Secondary | ICD-10-CM

## 2018-10-19 DIAGNOSIS — R42 Dizziness and giddiness: Secondary | ICD-10-CM | POA: Diagnosis not present

## 2018-10-19 DIAGNOSIS — E538 Deficiency of other specified B group vitamins: Secondary | ICD-10-CM | POA: Diagnosis not present

## 2018-10-19 DIAGNOSIS — Z0001 Encounter for general adult medical examination with abnormal findings: Secondary | ICD-10-CM

## 2018-10-19 DIAGNOSIS — H9193 Unspecified hearing loss, bilateral: Secondary | ICD-10-CM

## 2018-10-19 LAB — LIPID PANEL
Cholesterol: 104 mg/dL (ref 0–200)
HDL: 54.9 mg/dL (ref >39.00)
LDL Cholesterol: 38 mg/dL (ref 0–99)
NonHDL: 49.23
Total CHOL/HDL Ratio: 2
Triglycerides: 55 mg/dL (ref 0.0–149.0)
VLDL: 11 mg/dL (ref 0.0–40.0)

## 2018-10-19 LAB — CBC WITH DIFFERENTIAL/PLATELET
Basophils Absolute: 0 10*3/uL (ref 0.0–0.1)
Basophils Relative: 0.2 % (ref 0.0–3.0)
Eosinophils Absolute: 0 10*3/uL (ref 0.0–0.7)
Eosinophils Relative: 0.6 % (ref 0.0–5.0)
HCT: 43 % (ref 39.0–52.0)
Hemoglobin: 14.7 g/dL (ref 13.0–17.0)
Lymphocytes Relative: 35.8 % (ref 12.0–46.0)
Lymphs Abs: 2.5 10*3/uL (ref 0.7–4.0)
MCHC: 34.1 g/dL (ref 30.0–36.0)
MCV: 96.7 fl (ref 78.0–100.0)
Monocytes Absolute: 0.7 10*3/uL (ref 0.1–1.0)
Monocytes Relative: 9.6 % (ref 3.0–12.0)
Neutro Abs: 3.8 10*3/uL (ref 1.4–7.7)
Neutrophils Relative %: 53.8 % (ref 43.0–77.0)
Platelets: 220 10*3/uL (ref 150.0–400.0)
RBC: 4.45 Mil/uL (ref 4.22–5.81)
RDW: 13.1 % (ref 11.5–15.5)
WBC: 7.1 10*3/uL (ref 4.0–10.5)

## 2018-10-19 LAB — HEPATIC FUNCTION PANEL
ALT: 21 U/L (ref 0–53)
AST: 21 U/L (ref 0–37)
Albumin: 4.4 g/dL (ref 3.5–5.2)
Alkaline Phosphatase: 98 U/L (ref 39–117)
Bilirubin, Direct: 0.3 mg/dL (ref 0.0–0.3)
Total Bilirubin: 1.2 mg/dL (ref 0.2–1.2)
Total Protein: 7.2 g/dL (ref 6.0–8.3)

## 2018-10-19 LAB — URINALYSIS, ROUTINE W REFLEX MICROSCOPIC
Bilirubin Urine: NEGATIVE
Hgb urine dipstick: NEGATIVE
Ketones, ur: NEGATIVE
Leukocytes,Ua: NEGATIVE
Nitrite: NEGATIVE
RBC / HPF: NONE SEEN (ref 0–?)
Specific Gravity, Urine: 1.015 (ref 1.000–1.030)
Total Protein, Urine: NEGATIVE
Urine Glucose: NEGATIVE
Urobilinogen, UA: 0.2 (ref 0.0–1.0)
WBC, UA: NONE SEEN (ref 0–?)
pH: 5.5 (ref 5.0–8.0)

## 2018-10-19 LAB — BASIC METABOLIC PANEL
BUN: 11 mg/dL (ref 6–23)
CO2: 30 mEq/L (ref 19–32)
Calcium: 9.9 mg/dL (ref 8.4–10.5)
Chloride: 98 mEq/L (ref 96–112)
Creatinine, Ser: 0.75 mg/dL (ref 0.40–1.50)
GFR: 98.13 mL/min (ref >60.00)
Glucose, Bld: 101 mg/dL — ABNORMAL HIGH (ref 70–99)
Potassium: 4.3 mEq/L (ref 3.5–5.1)
Sodium: 135 mEq/L (ref 135–145)

## 2018-10-19 LAB — IBC PANEL
Iron: 110 ug/dL (ref 42–165)
Saturation Ratios: 38.1 % (ref 20.0–50.0)
Transferrin: 206 mg/dL — ABNORMAL LOW (ref 212.0–360.0)

## 2018-10-19 LAB — VITAMIN D 25 HYDROXY (VIT D DEFICIENCY, FRACTURES): VITD: 59.58 ng/mL (ref 30.00–100.00)

## 2018-10-19 LAB — VITAMIN B12: Vitamin B-12: 424 pg/mL (ref 211–911)

## 2018-10-19 LAB — HEMOGLOBIN A1C: Hgb A1c MFr Bld: 5.8 % (ref 4.6–6.5)

## 2018-10-19 LAB — TSH: TSH: 0.48 u[IU]/mL (ref 0.35–4.50)

## 2018-10-19 MED ORDER — MUPIROCIN CALCIUM 2 % EX CREA: 1.0000 "application " | TOPICAL_CREAM | Freq: Two times a day (BID) | CUTANEOUS | 0 refills | Status: DC

## 2018-10-19 NOTE — Progress Notes (Signed)
Subjective:    Patient ID: Jonathan Orozco, male    DOB: 02-10-1928, 85 y.o.   MRN: [id number]  HPI  Here to f/u; overall doing ok,  Pt denies chest pain, increasing sob or doe, wheezing, orthopnea, PND, increased LE swelling, palpitations, dizziness or syncope.  Pt denies new neurological symptoms such as new headache, or facial or extremity weakness or numbness.  Pt denies polydipsia, polyuria, or low sugar episode.  Pt states overall good compliance with meds, mostly trying to follow appropriate diet, with wt overall stable,  but little exercise however.  Does c/o ongoing fatigue, but denies signficant daytime hypersomnolence.  Has recurring vertigo worse in the past wk, feeling some more off balance, but no falls.  Also had bilateral hearing loss for 1 wk and asks for wax out again.  No HA, fever, ear pain, ST, cough or chills Past Medical History:  Diagnosis Date  . ABUSE, ALCOHOL, IN REMISSION 04/08/2007  . ALLERGIC RHINITIS 09/29/2006  . Arthritis    "hands" (07/02/2016)  . Atrial fibrillation ([place])   . Atrial fibrillation with RVR ([place]) 07/02/2016  . Atrial flutter ([place]) 03/28/2010   a. s/p prior rfca;  b. recurrent paroxysmal flutter 04/2012;  c. pradaxa initiated 04/2012.  [name] BENIGN PROSTATIC HYPERTROPHY 09/29/2006  . BPPV (benign paroxysmal positional vertigo) 04/08/2007  . CAD (coronary artery disease)    a. LHC 2/17: EF 55-65%, LM 75, pLAD 75, oD1 75, oD2 65, D3 85, oLCx 99, oOM1 75, pRCA 25 >> CABG  . Carotid stenosis    a. Carotid US 2/17:  Bilateral ICA 1-39% ICA  . Chronic lower back pain   . COLONIC POLYPS, HX OF 06/23/2007  . 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 Diverticulosis   . ERECTILE DYSFUNCTION 09/29/2006  . GERD (gastroesophageal reflux disease)   . GLUCOSE INTOLERANCE 03/19/2010  . HYPERLIPIDEMIA 09/29/2006  . HYPERTENSION 09/29/2006  . Long term (current) use of anticoagulants 04/26/2010  . MELANOMA, MALIGNANT, SKIN NOS 09/29/2006   other skin cancers -no further  melanoma  . OSTEOARTHROSIS NOS, OTHER [facility] SITE 09/29/2006  . Other specified forms of hearing loss 08/10/2009  . Pneumonia ~ 1969  . Presence of permanent cardiac pacemaker 06/02/2016  . VENTRICULAR TACHYCARDIA 09/29/2006   Past Surgical History:  Procedure Laterality Date  . AV NODE ABLATION  07/02/2016  . AV NODE ABLATION N/A 07/02/2016   Procedure: AV Node Ablation;  Surgeon: Deboraha Sprang, MD;  Location: [place] [facility];  Service: Cardiovascular;  Laterality: N/A;  . CARDIAC CATHETERIZATION N/A 03/12/2015   Procedure: Left Heart Cath and Coronary Angiography;  Surgeon: [name], MD;  Location: [place] [facility];  Service: Cardiovascular;  Laterality: N/A;  . CARDIOVERSION N/A 12/19/2015   Procedure: CARDIOVERSION;  Surgeon: [name], MD;  Location: [facility] ENDOSCOPY;  Service: Cardiovascular;  Laterality: N/A;  . CARDIOVERSION N/A 03/24/2016   Procedure: CARDIOVERSION;  Surgeon: [name], MD;  Location: [place];  Service: Cardiovascular;  Laterality: N/A;  . CATARACT EXTRACTION W/ INTRAOCULAR LENS  IMPLANT, BILATERAL Bilateral   . CLIPPING OF ATRIAL APPENDAGE N/A 03/14/2015   Procedure: CLIPPING OF ATRIAL APPENDAGE;  Surgeon: [name], MD;  Location: [place];  Service: Open Heart Surgery;  Laterality: N/A;  . COLONOSCOPY WITH PROPOFOL N/A 12/08/2014   Procedure: COLONOSCOPY WITH PROPOFOL;  Surgeon: [name], MD;  Location: [facility] ENDOSCOPY;  Service: Endoscopy;  Laterality: N/A;  . CORONARY ARTERY BYPASS GRAFT N/A 03/14/2015   Procedure:  CORONARY ARTERY BYPASS GRAFTING (CABG) x 4 (LIMA to LAD, SVG to DIAGONAL 2, SVG SEQUENTIALLY to OM1 and OM2);  Surgeon: [name], MD;  Location: [place];  Service: Open Heart Surgery;  Laterality: N/A;  . INCISION AND DRAINAGE N/A 07/03/2016   Procedure: INCISION AND DRAINAGE of CHEST ABSCESS;  Surgeon: [name], MD;  Location: [place];  Service: Thoracic;  Laterality: N/A;  . INSERT / REPLACE / REMOVE  PACEMAKER  06/02/2016  . JOINT REPLACEMENT    . KNEE ARTHROSCOPY Left   . MELANOMA EXCISION     "upper back"  . PACEMAKER IMPLANT N/A 06/02/2016   Procedure: Pacemaker Implant;  Surgeon: Deboraha Sprang, MD;  Location: [place] [facility] LAB;  Service: Cardiovascular;  Laterality: N/A;  . SHOULDER OPEN ROTATOR CUFF REPAIR Left   . SKIN CANCER EXCISION Right    "cheek; real deep"  . TEE WITHOUT CARDIOVERSION N/A 03/14/2015   Procedure: TRANSESOPHAGEAL ECHOCARDIOGRAM (TEE);  Surgeon: [name], MD;  Location: [place];  Service: Open Heart Surgery;  Laterality: N/A;  . TEE WITHOUT CARDIOVERSION N/A 12/19/2015   Procedure: TRANSESOPHAGEAL ECHOCARDIOGRAM (TEE);  Surgeon: [name], MD;  Location: [place];  Service: Cardiovascular;  Laterality: N/A;  . TEE WITHOUT CARDIOVERSION N/A 03/24/2016   Procedure: TRANSESOPHAGEAL ECHOCARDIOGRAM (TEE);  Surgeon: [name], MD;  Location: [place];  Service: Cardiovascular;  Laterality: N/A;  . TONSILLECTOMY    . TOTAL KNEE ARTHROPLASTY Right     reports that he has quit smoking. His smoking use included cigarettes. He has a 52.00 pack-year smoking history. He has never used smokeless tobacco. He reports that he does not drink alcohol or use drugs. family history includes Diabetes in his brother; Esophageal cancer in his father. Allergies  Allergen Reactions  . Amiodarone Hcl Other (See Comments)    Patient reports photosensitivity  . Ace Inhibitors Cough   Current Outpatient Medications on File Prior to Visit  Medication Sig Dispense Refill  . apixaban (ELIQUIS) 5 MG TABS tablet Take 1 tablet (5 mg total) by mouth 2 (two) times daily. 180 tablet 1  . atorvastatin (LIPITOR) 10 MG tablet Take 1 tablet (10 mg total) by mouth daily. 90 tablet 3  . Benzethonium Cl & Zinc Oxide 0.13 & 10 % KIT Apply topically.    [name] doxylamine, Sleep, (UNISOM) 25 MG tablet Take 25 mg by mouth at bedtime.    . fish oil-omega-3 fatty acids 1000 MG  capsule Take 1 g by mouth every evening.     [name] HYDROcodone-acetaminophen (NORCO) 10-325 MG tablet Take 1 tablet by mouth daily as needed. 30 tablet 0  . Multiple Vitamin (MULTIVITAMIN) tablet Take 1 tablet by mouth daily.      [name] omega-3 acid ethyl esters (LOVAZA) 1 g capsule Take by mouth.    . Psyllium (METAMUCIL PO) 1 TABLESPOON IN A GLASS OF WATER ONCE DAILY    . triamcinolone (NASACORT) 55 MCG/ACT AERO nasal inhaler Place 2 sprays into the nose daily. 1 Inhaler 12  . ZINC OXIDE EX Apply 1 application topically as needed (itching).     No current facility-administered medications on file prior to visit.    Review of Systems  Constitutional: Negative for other unusual diaphoresis or sweats HENT: Negative for ear discharge or swelling Eyes: Negative for other worsening visual disturbances Respiratory: Negative for stridor or other swelling  Gastrointestinal: Negative for worsening distension or other blood Genitourinary: Negative for retention or other urinary change Musculoskeletal: Negative  for other MSK pain or swelling Skin: Negative for color change or other new lesions Neurological: Negative for worsening tremors and other numbness  Psychiatric/Behavioral: Negative for worsening agitation or other fatigue All otherwise neg per pt    Objective:   Physical Exam BP 120/76   Pulse 85   Temp 97.6 F (36.4 C) (Oral)   Wt 173 lb (78.5 kg)   SpO2 95%   BMI 24.82 kg/m  VS noted,  Constitutional: Pt appears in NAD HENT: Head: NCAT.  Right Ear: External ear normal.  Left Ear: External ear normal.  Eyes: . Pupils are equal, round, and reactive to light. Conjunctivae and EOM are normal Nose: without d/c or deformity Neck: Neck supple. Gross normal ROM Cardiovascular: Normal rate and regular rhythm.   Pulmonary/Chest: Effort normal and breath sounds without rales or wheezing.  Abd:  Soft, NT, ND, + BS, no organomegaly Neurological: Pt is alert. At baseline orientation, motor  grossly intact Skin: Skin is warm. No rashes, other new lesions, no LE edema Psychiatric: Pt behavior is normal without agitation  All otherwise neg per pt Lab Results  Component Value Date   WBC 7.1 10/19/2018   HGB 14.7 10/19/2018   HCT 43.0 10/19/2018   PLT 220.0 10/19/2018   GLUCOSE 101 (H) 10/19/2018   CHOL 104 10/19/2018   TRIG 55.0 10/19/2018   HDL 54.90 10/19/2018   LDLDIRECT 159.2 09/29/2006   LDLCALC 38 10/19/2018   ALT 21 10/19/2018   AST 21 10/19/2018   NA 135 10/19/2018   K 4.3 10/19/2018   CL 98 10/19/2018   CREATININE 0.75 10/19/2018   BUN 11 10/19/2018   CO2 30 10/19/2018   TSH 0.48 10/19/2018   PSA 3.75 06/12/2016   INR 3.1 (A) 06/01/2018   HGBA1C 5.8 10/19/2018      Assessment & Plan:

## 2018-10-19 NOTE — Patient Instructions (Signed)
Your ears were cleared of wax impactions today  You will be contacted regarding the referral for: Vestibular Rehabilitation (a kind of physical therapy)   Please continue all other medications as before, and refills have been done if requested.  Please have the pharmacy call with any other refills you may need.  Please continue your efforts at being more active, low cholesterol diet, and weight control.  You are otherwise up to date with prevention measures today.  Please keep your appointments with your specialists as you may have planned  Please go to the LAB in the Basement (turn left off the elevator) for the tests to be done today  You will be contacted by phone if any changes need to be made immediately.  Otherwise, you will receive a letter about your results with an explanation, but please check with MyChart first.  Please remember to sign up for MyChart if you have not done so, as this will be important to you in the future with finding out test results, communicating by private email, and scheduling acute appointments online when needed.  Please return in 6 months, or sooner if needed

## 2018-10-20 ENCOUNTER — Encounter: Payer: Self-pay | Admitting: Internal Medicine

## 2018-10-20 ENCOUNTER — Other Ambulatory Visit: Payer: Self-pay | Admitting: Internal Medicine

## 2018-10-20 MED ORDER — MUPIROCIN 2 % EX OINT: 1.0000 "application " | TOPICAL_OINTMENT | Freq: Two times a day (BID) | CUTANEOUS | 2 refills | Status: DC

## 2018-10-22 ENCOUNTER — Other Ambulatory Visit: Payer: Self-pay | Admitting: Internal Medicine

## 2018-10-23 ENCOUNTER — Encounter: Payer: Self-pay | Admitting: Internal Medicine

## 2018-10-23 NOTE — Assessment & Plan Note (Signed)
Etiology unclear, for labs as ordered 

## 2018-10-23 NOTE — Assessment & Plan Note (Signed)
stable overall by history and exam, recent data reviewed with pt, and pt to continue medical treatment as before,  to f/u any worsening symptoms or concerns  

## 2018-10-23 NOTE — Assessment & Plan Note (Addendum)
Worsening, for vestibular rehab referral, asked to use cane even in the home

## 2018-10-23 NOTE — Assessment & Plan Note (Signed)
Improved s/p irrigation,  to f/u any worsening symptoms or concerns  

## 2018-10-29 ENCOUNTER — Other Ambulatory Visit: Payer: Self-pay | Admitting: Internal Medicine

## 2018-10-29 MED ORDER — HYDROCODONE-ACETAMINOPHEN 10-325 MG PO TABS: 1.0000 | ORAL_TABLET | Freq: Every day | ORAL | 0 refills | Status: DC | PRN

## 2018-10-29 NOTE — Telephone Encounter (Signed)
Done erx 

## 2018-11-11 ENCOUNTER — Telehealth: Payer: Self-pay | Admitting: Internal Medicine

## 2018-11-11 NOTE — Telephone Encounter (Signed)
New message   Patient's wife is requesting that labs would be ordered since the patient is now on eliquis after 6 months. Please call.

## 2018-11-11 NOTE — Telephone Encounter (Signed)
Informed patients wife that his lab work from PCP last month would suffice and he does not need additional lab work at this time being that all CBC results were within normal limits

## 2018-11-13 ENCOUNTER — Other Ambulatory Visit: Payer: Self-pay | Admitting: Internal Medicine

## 2018-11-22 ENCOUNTER — Ambulatory Visit (INDEPENDENT_AMBULATORY_CARE_PROVIDER_SITE_OTHER): Payer: Medicare Other | Admitting: *Deleted

## 2018-11-22 DIAGNOSIS — I4729 Other ventricular tachycardia: Secondary | ICD-10-CM

## 2018-11-22 DIAGNOSIS — I4891 Unspecified atrial fibrillation: Secondary | ICD-10-CM

## 2018-11-22 DIAGNOSIS — I472 Ventricular tachycardia: Secondary | ICD-10-CM

## 2018-11-23 ENCOUNTER — Telehealth: Payer: Self-pay

## 2018-11-23 LAB — CUP PACEART REMOTE DEVICE CHECK
Battery Remaining Longevity: 113 mo
Battery Voltage: 3.01 V
Brady Statistic RV Percent Paced: 94.84 %
Date Time Interrogation Session: 20201020144113
Implantable Lead Implant Date: 20180430
Implantable Lead Location: 753860
Implantable Lead Model: 5076
Implantable Pulse Generator Implant Date: 20180430
Lead Channel Impedance Value: 342 Ohm
Lead Channel Impedance Value: 475 Ohm
Lead Channel Pacing Threshold Amplitude: 0.625 V
Lead Channel Pacing Threshold Pulse Width: 0.4 ms
Lead Channel Sensing Intrinsic Amplitude: 11.25 mV
Lead Channel Sensing Intrinsic Amplitude: 11.25 mV
Lead Channel Setting Pacing Amplitude: 2.5 V
Lead Channel Setting Pacing Pulse Width: 0.4 ms
Lead Channel Setting Sensing Sensitivity: 1.2 mV

## 2018-11-23 NOTE — Telephone Encounter (Signed)
Transmission received.

## 2018-11-23 NOTE — Telephone Encounter (Signed)
Pt wife got a message that the pt did not send his transmission. The pt wife agreed to send the transmission today. I told her I will give her a call tomorrow to let her know if we got the transmission or not.

## 2018-11-26 ENCOUNTER — Other Ambulatory Visit: Payer: Self-pay | Admitting: Internal Medicine

## 2018-11-26 NOTE — Telephone Encounter (Addendum)
Eliquis 5mg  refill request received. Pt is 85 years old, weight-78.5kg, Crea-0.75 on 10/19/2018, Diagnosis-Afib, and last seen by Dr. [name] on 08/04/2017-therefore pt is overdue. Dose is appropriate based on dosing criteria.  Will call pt to notify him that he needs an appt with Cardiologist.  Called the pt and had to leave a message regarding the pt needing an appt since he is overdue for Cardiology follow-up and had to leave a message.  Pt called back and set an appt. Refill sent for pt at this time. Called pt and wife answered and took a message that refill was sent.

## 2018-11-28 ENCOUNTER — Other Ambulatory Visit: Payer: Self-pay | Admitting: Internal Medicine

## 2018-11-30 MED ORDER — HYDROCODONE-ACETAMINOPHEN 10-325 MG PO TABS: 1.0000 | ORAL_TABLET | Freq: Every day | ORAL | 0 refills | Status: DC | PRN

## 2018-11-30 NOTE — Telephone Encounter (Signed)
Done erx 

## 2018-12-03 ENCOUNTER — Other Ambulatory Visit: Payer: Self-pay | Admitting: Family

## 2018-12-05 NOTE — Progress Notes (Signed)
Cardiology Office Note Date:  12/07/2018  Patient ID:  Jonathan Orozco, Jonathan Orozco [birth year], MRN [id number] PCP:  Biagio Borg, MD  Cardiologist/Electrophysiologist; Dr. [name]   Chief Complaint:  annual EP follow up  History of Present Illness: Jonathan Orozco is a 85 y.o. male with history of CAD (03/14/15 CABG  LIMA > LAD, SVG > 2nd Diag and OM 1/2 , also had LAA clipping ), HTN, HFpEF, symptomatic bradycardia w/PPM, uncontrolled rapid AFib, Atypical AFlutter now s/p AVN ablation, RVOT VT intolerant of amiodarone (patient states with sun sensitivity and visual changes), chronic LBP  He saw neurology, Dr. Tomi Likens 12/08/16 2/2 gait instability, exam not felt to suggest intracranial  issue, and referred to PT.  He saw Dr. Jenny Reichmann PMD 12/23/16, his BP was 116/78. Noted however a PT note mentioned patient cancelled remaining visits 2/2 poor health and would have MD reorder when able, also mention elevated BP 170's/90's and ?? Fluctuating HR. 63-83bpm at rest, patient has reported to PT feeling nervous like he was out of rhythm.  I saw him Dec 2018, he was accompanied by his wife.  He reported feeling very well and thankful for this given he was about to be 89.  His wife was in agreement.  I discussed recent notes with PT that he had cancelled his remaining session due to not feeling well, BP, HR concerns.  He states only one day did he feel unusually poor, his BP was elevated and he blamed that on being upset with the staff, and generally felt like the exercises he did there he could do at home, this being the main reason he cancelled.  He felt like his gait is slightly improved.  He denied any kind of CP, palpitations, no SOB or DOE. No dizziness, near syncope or syncope.  He denied any bleeding or signs of bleeding.    No changes were made, he denied HTN, his BP that day 130's, and did not want to be started in any medicines reporting that historically on CCB he devloped low BP  He saw Dr. [name] July 2019, no  changes were made to his medicines or device programming.  He is doing well.  He reports som back pain and slight balance issue, but no falls or near falls, reports being very carefuull.  No CP, palpitations or SOB, no dizzy spells, near syncope or syncope.   He is on Eliquis now, doing well, no bleeding or signs of bleeding  He mentions his feet feel cold all of the time, no discoloration and inquires about PVD (noted on the wall poster)  Device information MDT single chamber PPM implanted 06/02/16, Dr. [name] DEVICE DEPENDENT s/p AVNode ablation  AAD Intolerant of amiodarone (?sunsensitivity)  Past Medical History:  Diagnosis Date  . ABUSE, ALCOHOL, IN REMISSION 04/08/2007  . ALLERGIC RHINITIS 09/29/2006  . Arthritis    "hands" (07/02/2016)  . Atrial fibrillation ([place])   . Atrial fibrillation with RVR ([place]) 07/02/2016  . Atrial flutter ([place]) 03/28/2010   a. s/p prior rfca;  b. recurrent paroxysmal flutter 04/2012;  c. pradaxa initiated 04/2012.  [name] BENIGN PROSTATIC HYPERTROPHY 09/29/2006  . BPPV (benign paroxysmal positional vertigo) 04/08/2007  . CAD (coronary artery disease)    a. LHC 2/17: EF 55-65%, LM 75, pLAD 75, oD1 75, oD2 65, D3 85, oLCx 99, oOM1 75, pRCA 25 >> CABG  . Carotid stenosis    a. Carotid US 2/17:  Bilateral ICA 1-39% ICA  . Chronic lower back pain   .  COLONIC POLYPS, HX OF 06/23/2007  . 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 Diverticulosis   . ERECTILE DYSFUNCTION 09/29/2006  . GERD (gastroesophageal reflux disease)   . GLUCOSE INTOLERANCE 03/19/2010  . HYPERLIPIDEMIA 09/29/2006  . HYPERTENSION 09/29/2006  . Long term (current) use of anticoagulants 04/26/2010  . MELANOMA, MALIGNANT, SKIN NOS 09/29/2006   other skin cancers -no further melanoma  . OSTEOARTHROSIS NOS, OTHER [facility] SITE 09/29/2006  . Other specified forms of hearing loss 08/10/2009  . Pneumonia ~ 1969  . Presence of permanent cardiac pacemaker 06/02/2016  . VENTRICULAR TACHYCARDIA 09/29/2006    Past  Surgical History:  Procedure Laterality Date  . AV NODE ABLATION  07/02/2016  . AV NODE ABLATION N/A 07/02/2016   Procedure: AV Node Ablation;  Surgeon: Deboraha Sprang, MD;  Location: [place] [facility];  Service: Cardiovascular;  Laterality: N/A;  . CARDIAC CATHETERIZATION N/A 03/12/2015   Procedure: Left Heart Cath and Coronary Angiography;  Surgeon: [name], MD;  Location: [place] [facility];  Service: Cardiovascular;  Laterality: N/A;  . CARDIOVERSION N/A 12/19/2015   Procedure: CARDIOVERSION;  Surgeon: [name], MD;  Location: [facility] ENDOSCOPY;  Service: Cardiovascular;  Laterality: N/A;  . CARDIOVERSION N/A 03/24/2016   Procedure: CARDIOVERSION;  Surgeon: [name], MD;  Location: [place];  Service: Cardiovascular;  Laterality: N/A;  . CATARACT EXTRACTION W/ INTRAOCULAR LENS  IMPLANT, BILATERAL Bilateral   . CLIPPING OF ATRIAL APPENDAGE N/A 03/14/2015   Procedure: CLIPPING OF ATRIAL APPENDAGE;  Surgeon: [name], MD;  Location: [place];  Service: Open Heart Surgery;  Laterality: N/A;  . COLONOSCOPY WITH PROPOFOL N/A 12/08/2014   Procedure: COLONOSCOPY WITH PROPOFOL;  Surgeon: [name], MD;  Location: [facility] ENDOSCOPY;  Service: Endoscopy;  Laterality: N/A;  . CORONARY ARTERY BYPASS GRAFT N/A 03/14/2015   Procedure: CORONARY ARTERY BYPASS GRAFTING (CABG) x 4 (LIMA to LAD, SVG to DIAGONAL 2, SVG SEQUENTIALLY to OM1 and OM2);  Surgeon: [name], MD;  Location: [place];  Service: Open Heart Surgery;  Laterality: N/A;  . INCISION AND DRAINAGE N/A 07/03/2016   Procedure: INCISION AND DRAINAGE of CHEST ABSCESS;  Surgeon: [name], MD;  Location: [place];  Service: Thoracic;  Laterality: N/A;  . INSERT / REPLACE / REMOVE PACEMAKER  06/02/2016  . JOINT REPLACEMENT    . KNEE ARTHROSCOPY Left   . MELANOMA EXCISION     "upper back"  . PACEMAKER IMPLANT N/A 06/02/2016   Procedure: Pacemaker Implant;  Surgeon: Deboraha Sprang, MD;  Location: [place] [facility]  [facility];  Service: Cardiovascular;  Laterality: N/A;  . SHOULDER OPEN ROTATOR CUFF REPAIR Left   . SKIN CANCER EXCISION Right    "cheek; real deep"  . TEE WITHOUT CARDIOVERSION N/A 03/14/2015   Procedure: TRANSESOPHAGEAL ECHOCARDIOGRAM (TEE);  Surgeon: [name], MD;  Location: [place];  Service: Open Heart Surgery;  Laterality: N/A;  . TEE WITHOUT CARDIOVERSION N/A 12/19/2015   Procedure: TRANSESOPHAGEAL ECHOCARDIOGRAM (TEE);  Surgeon: [name], MD;  Location: [place];  Service: Cardiovascular;  Laterality: N/A;  . TEE WITHOUT CARDIOVERSION N/A 03/24/2016   Procedure: TRANSESOPHAGEAL ECHOCARDIOGRAM (TEE);  Surgeon: [name], MD;  Location: [place];  Service: Cardiovascular;  Laterality: N/A;  . TONSILLECTOMY    . TOTAL KNEE ARTHROPLASTY Right     Current Outpatient Medications  Medication Sig Dispense Refill  . apixaban (ELIQUIS) 5 MG TABS tablet Take 1 tablet (5 mg total) by mouth 2 (two) times  daily. 180 tablet 1  . atorvastatin (LIPITOR) 10 MG tablet TAKE 1 TABLET DAILY 90 tablet 3  . Benzethonium Cl & Zinc Oxide 0.13 & 10 % KIT Apply topically.    [name] doxylamine, Sleep, (UNISOM) 25 MG tablet Take 25 mg by mouth at bedtime.    . fluticasone (FLONASE) 50 MCG/ACT nasal spray Use 2 spray(s) in each nostril once daily 16 g 0  . HYDROcodone-acetaminophen (NORCO) 10-325 MG tablet Take 1 tablet by mouth daily as needed. 30 tablet 0  . Multiple Vitamin (MULTIVITAMIN) tablet Take 1 tablet by mouth daily.      . mupirocin cream (BACTROBAN) 2 % Apply 1 application topically 2 (two) times daily. 15 g 0  . mupirocin ointment (BACTROBAN) 2 % Place 1 application into the nose 2 (two) times daily. 22 g 2  . omega-3 acid ethyl esters (LOVAZA) 1 g capsule Take by mouth.    . Psyllium (METAMUCIL PO) 1 TABLESPOON IN A GLASS OF WATER ONCE DAILY    . tamsulosin (FLOMAX) 0.4 MG CAPS capsule TAKE 1 CAPSULE DAILY 90 capsule 3  . triamcinolone (NASACORT) 55 MCG/ACT AERO nasal inhaler  Place 2 sprays into the nose daily. 1 Inhaler 12  . ZINC OXIDE EX Apply 1 application topically as needed (itching).     No current facility-administered medications for this visit.     Allergies:   Amiodarone hcl and Ace inhibitors   Social History:  The patient  reports that he has quit smoking. His smoking use included cigarettes. He has a 52.00 pack-year smoking history. He has never used smokeless tobacco. He reports that he does not drink alcohol or use drugs.   Family History:  The patient's family history includes Diabetes in his brother; Esophageal cancer in his father.  ROS:  Please see the history of present illness.  All other systems are reviewed and otherwise negative.   PHYSICAL EXAM:  VS:  BP (!) 158/66   Pulse 75   Ht _0  (1.778 m)   Wt 171 lb (77.6 kg)   BMI 24.54 kg/m  BMI: Body mass index is 24.54 kg/m. Well nourished, well developed, in no acute distress  HEENT: normocephalic, atraumatic  Neck: no JVD, carotid bruits or masses Cardiac:  RRR (paced) ; soft SM, no rubs, or gallops Lungs:  CTA b/l, no wheezing, rhonchi or rales  Abd: soft, nontender MS: no deformity, age appropriate atrophy Ext: no edema  Skin: warm and dry, no rash Neuro:  No gross deficits appreciated Psych: euthymic mood, full affect  PPM site is stable, no tethering or discomfort   EKG:  AFlutter, V paced PPM interrogation done today and reviewed by myself:  Battery and lead measurements are good VT episodes EGMs reviewed, unable to tell with single lead, likely NSVT, longest 2 seconds He has an underlying rhythm today 40's-50    07/02/16: Procedure: HV measurement and AV junction ablation A total of 1.2 minutes of our energy were applied at sites where the his potential was identified. Complete heart block ensued. The patient had a junctional escape rate at about 40 bpm.  03/24/16: TEE/DCCV Study Conclusions - Left ventricle: Systolic function was normal. The estimated    ejection fraction was in the range of 55% to 60%. Wall motion was   normal; there were no regional wall motion abnormalities. - Aortic valve: Structurally normal valve. Trileaflet; normal   thickness leaflets. There was no significant regurgitation. - Mitral valve: There was mild to moderate regurgitation. -  Left atrium: No evidence of thrombus in the atrial cavity. - Right ventricle: Systolic function was normal. - Right atrium: No evidence of thrombus in the atrial cavity or   appendage. - Tricuspid valve: There was mild regurgitation. Impressions: - TEE was followed by a successful cardioversion.  03/12/15: LHC 1. Prox RCA to Dist RCA lesion, 25% stenosed. 2. LM lesion, 75% stenosed. 3. Ost Cx lesion, 99% stenosed. 4. Ost 1st Mrg to 1st Mrg lesion, 75% stenosed. 5. Prox LAD to Mid LAD lesion, 75% stenosed. 6. Ost 1st Diag lesion, 75% stenosed. 7. Ost 2nd Diag to 2nd Diag lesion, 65% stenosed. 8. 3rd Diag lesion, 85% stenosed.    CCS class III angina pectoris and dyspnea.  Calcific coronary artery disease with 75% distal left main, segmental 70-80% mid LAD stenosis, moderate 2 and 3 stenosis, 99% ostial circumflex, 70% proximal obtuse marginal 1, and luminal irregularities throughout a dominant right coronary.  Normal left ventricular systolic function with normal hemodynamics. EF greater than 50%. RECOMMENDATIONS:  Elderly but fit a 29-year-old gentleman with severe left main, circumflex, and LAD disease. Class III CCS functional status and progressive over the past 6 weeks.  We'll admit to the hospital, place on IV heparin, get TCTS consult to consider revascularization options.  Recent Labs: 10/19/2018: ALT 21; BUN 11; Creatinine, Ser 0.75; Hemoglobin 14.7; Platelets 220.0; Potassium 4.3; Sodium 135; TSH 0.48  10/19/2018: Cholesterol 104; HDL 54.90; LDL Cholesterol 38; Total CHOL/HDL Ratio 2; Triglycerides 55.0; VLDL 11.0   CrCl cannot be calculated (Patient's most recent lab  result is older than the maximum 21 days allowed.).   Wt Readings from Last 3 Encounters:  12/07/18 171 lb (77.6 kg)  10/19/18 173 lb (78.5 kg)  02/23/18 172 lb (78 kg)     Other studies reviewed: Additional studies/records reviewed today include: summarized above  ASSESSMENT AND PLAN:  1. Persistent uncontrolled AFib/atypical Aflutter,      s/p AVNode ablation (LA clipping)     CHA2DS2Vasc is 4, on Eliquis, appropriately dosed by labs in Sept      2. Bradycardia w/PPM     Intact device function     No programming changes made  3. CAD s/p CABG Feb 2017     No anginal complaints, on statin, lipids are monitored and managed with his PMD     No ASA given Eliquis  4. HTN    Again, he denies this, reports his BP at home are "perfect", does not want to consider medicines  5. HFpEF     No symptoms or exam findings to suggest volume OL  6. He mentions concerns of PVD, with cold feet all of the time     Denies calf pain or symptoms of claudication     I offered to examine his feet/pulses, he declined, says he will ask Dr. Jenny Reichmann to take a look     He denies any skin changes, discoloration, or wounds   Disposition: continue Q 3 mo remotes, in clinic visit in 1 year, sooner if needed  Current medicines are reviewed at length with the patient today.  The patient did not have any concerns regarding medicines.  Venetia Night, PA-C 12/07/2018 2:33 PM     [address] [place] [address] [phone] (office)  [phone] (fax)

## 2018-12-07 ENCOUNTER — Ambulatory Visit (INDEPENDENT_AMBULATORY_CARE_PROVIDER_SITE_OTHER): Payer: Medicare Other | Admitting: Physician Assistant

## 2018-12-07 ENCOUNTER — Other Ambulatory Visit: Payer: Self-pay

## 2018-12-07 VITALS — BP 158/66 | HR 75 | Ht 70.0 in | Wt 171.0 lb

## 2018-12-07 DIAGNOSIS — I2510 Atherosclerotic heart disease of native coronary artery without angina pectoris: Secondary | ICD-10-CM | POA: Diagnosis not present

## 2018-12-07 DIAGNOSIS — I4821 Permanent atrial fibrillation: Secondary | ICD-10-CM | POA: Diagnosis not present

## 2018-12-07 DIAGNOSIS — Z950 Presence of cardiac pacemaker: Secondary | ICD-10-CM | POA: Diagnosis not present

## 2018-12-07 DIAGNOSIS — I4891 Unspecified atrial fibrillation: Secondary | ICD-10-CM

## 2018-12-07 DIAGNOSIS — I10 Essential (primary) hypertension: Secondary | ICD-10-CM

## 2018-12-07 NOTE — Patient Instructions (Signed)
Medication Instructions:  Your physician recommends that you continue on your current medications as directed. Please refer to the Current Medication list given to you today.   *If you need a refill on your cardiac medications before your next appointment, please call your pharmacy*  Lab Work: [place]    If you have labs (blood work) drawn today and your tests are completely normal, you will receive your results only by: [name] MyChart Message (if you have MyChart) OR . A paper copy in the mail If you have any lab test that is abnormal or we need to change your treatment, we will call you to review the results.  Testing/Procedures: NONE ORDERED  TODAY    Follow-Up: At [facility], you and your health needs are our priority.  As part of our continuing mission to provide you with exceptional heart care, we have created designated Provider Care Teams.  These Care Teams include your primary Cardiologist (physician) and Advanced Practice Providers (APPs -  Physician Assistants and Nurse Practitioners) who all work together to provide you with the care you need, when you need it.  Your next appointment:   12 months  The format for your next appointment:   In Person  Provider:   You may see Dr. [name]  or one of the following Advanced Practice Providers on your designated Care Team:    [name], NP  Tommye Standard, PA-C  Legrand Como "Oda [place]ts, [place]   Other Instructions

## 2018-12-13 ENCOUNTER — Ambulatory Visit: Payer: Medicare Other | Admitting: Physical Therapy

## 2018-12-14 NOTE — Progress Notes (Signed)
Remote pacemaker transmission.   

## 2018-12-27 ENCOUNTER — Other Ambulatory Visit: Payer: Self-pay | Admitting: Ophthalmology

## 2018-12-31 ENCOUNTER — Other Ambulatory Visit: Payer: Self-pay | Admitting: Internal Medicine

## 2019-01-03 MED ORDER — HYDROCODONE-ACETAMINOPHEN 10-325 MG PO TABS: 1.0000 | ORAL_TABLET | Freq: Every day | ORAL | 0 refills | Status: DC | PRN

## 2019-01-03 NOTE — Telephone Encounter (Signed)
Done erx 

## 2019-01-03 NOTE — Telephone Encounter (Signed)
Patient called in stating he would like to speak with nurse and PCP about his medication. Pt states he has an issue getting it on time and would like to talk to someone about it. Please advise.

## 2019-01-05 ENCOUNTER — Ambulatory Visit: Payer: Medicare Other | Admitting: Internal Medicine

## 2019-02-08 ENCOUNTER — Other Ambulatory Visit: Payer: Self-pay | Admitting: Internal Medicine

## 2019-02-08 MED ORDER — HYDROCODONE-ACETAMINOPHEN 10-325 MG PO TABS: 1.0000 | ORAL_TABLET | Freq: Every day | ORAL | 0 refills | Status: DC | PRN

## 2019-02-08 NOTE — Telephone Encounter (Signed)
Requested medication (s) are due for refill today: yes  Requested medication (s) are on the active medication list: es  Last refill:  12/31/2018  Future visit scheduled: no  Notes to clinic:  not delegated   Requested Prescriptions  Pending Prescriptions Disp Refills   HYDROcodone-acetaminophen (NORCO) 10-325 MG tablet 30 tablet 0    Sig: Take 1 tablet by mouth daily as needed.      Not Delegated - Analgesics:  Opioid Agonist Combinations Failed - 02/08/2019 12:56 PM      Failed - This refill cannot be delegated      Failed - Urine Drug Screen completed in last 360 days.      Passed - Valid encounter within last 6 months    Recent Outpatient Visits           3 months ago Encounter for well adult exam with abnormal findings   [place], James W, MD   7 months ago Hyperglycemia   [place] [facility], James W, MD   11 months ago Preventative health care   [facility] -Georges Mouse, [place]   1 year ago Left arm swelling   [facility] -Georges Mouse, MD   1 year ago Abdominal pain, unspecified abdominal location   [place], Marvis Repress, [place]

## 2019-02-08 NOTE — Telephone Encounter (Signed)
Medication Refill - Medication:  HYDROcodone-acetaminophen (NORCO) 10-325 MG tablet  Has the patient contacted their pharmacy?  Yes advised to call office.   Preferred Pharmacy (with phone number or street name):  [place], [place] Phone:  [phone]  Fax:  [phone]     Agent: Please be advised that RX refills may take up to 3 business days. We ask that you follow-up with your pharmacy.

## 2019-02-08 NOTE — Telephone Encounter (Signed)
Done erx 

## 2019-02-21 ENCOUNTER — Ambulatory Visit (INDEPENDENT_AMBULATORY_CARE_PROVIDER_SITE_OTHER): Payer: Medicare Other | Admitting: *Deleted

## 2019-02-21 DIAGNOSIS — I4891 Unspecified atrial fibrillation: Secondary | ICD-10-CM | POA: Diagnosis not present

## 2019-02-25 LAB — CUP PACEART REMOTE DEVICE CHECK
Battery Remaining Longevity: 105 mo
Battery Voltage: 3.01 V
Brady Statistic RV Percent Paced: 96.8 %
Date Time Interrogation Session: 20210121174851
Implantable Lead Implant Date: 20180430
Implantable Lead Location: 753860
Implantable Lead Model: 5076
Implantable Pulse Generator Implant Date: 20180430
Lead Channel Impedance Value: 342 Ohm
Lead Channel Impedance Value: 418 Ohm
Lead Channel Pacing Threshold Amplitude: 0.625 V
Lead Channel Pacing Threshold Pulse Width: 0.4 ms
Lead Channel Sensing Intrinsic Amplitude: 2.5 mV
Lead Channel Sensing Intrinsic Amplitude: 2.5 mV
Lead Channel Setting Pacing Amplitude: 2.5 V
Lead Channel Setting Pacing Pulse Width: 0.4 ms
Lead Channel Setting Sensing Sensitivity: 1.2 mV

## 2019-03-11 ENCOUNTER — Other Ambulatory Visit: Payer: Self-pay | Admitting: Internal Medicine

## 2019-03-11 MED ORDER — HYDROCODONE-ACETAMINOPHEN 10-325 MG PO TABS: 1.0000 | ORAL_TABLET | Freq: Every day | ORAL | 0 refills | Status: DC | PRN

## 2019-03-11 NOTE — Telephone Encounter (Signed)
Done erx 

## 2019-04-04 IMAGING — CR DG ELBOW COMPLETE 3+V*L*
4 series · 4 of 4 positions shown · non-contrast
Comparison: None.

CLINICAL DATA: Acute onset of left elbow pain for 3 days. Initial
encounter.

EXAM:
LEFT ELBOW - COMPLETE 3+ VIEW

[elbow ap]
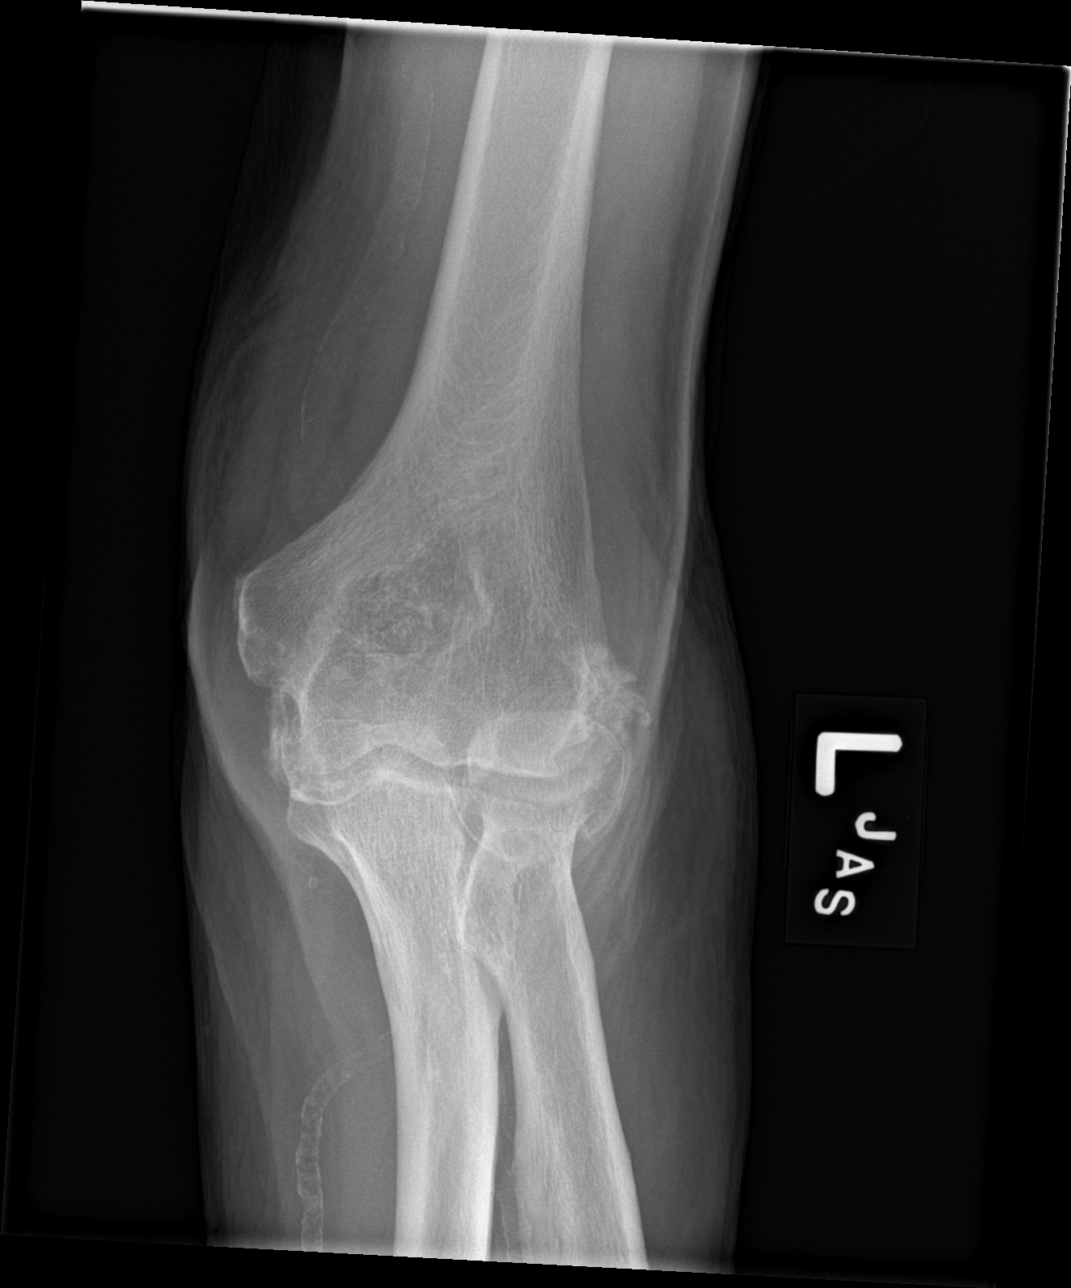

[elbow obl (1 of 2)]
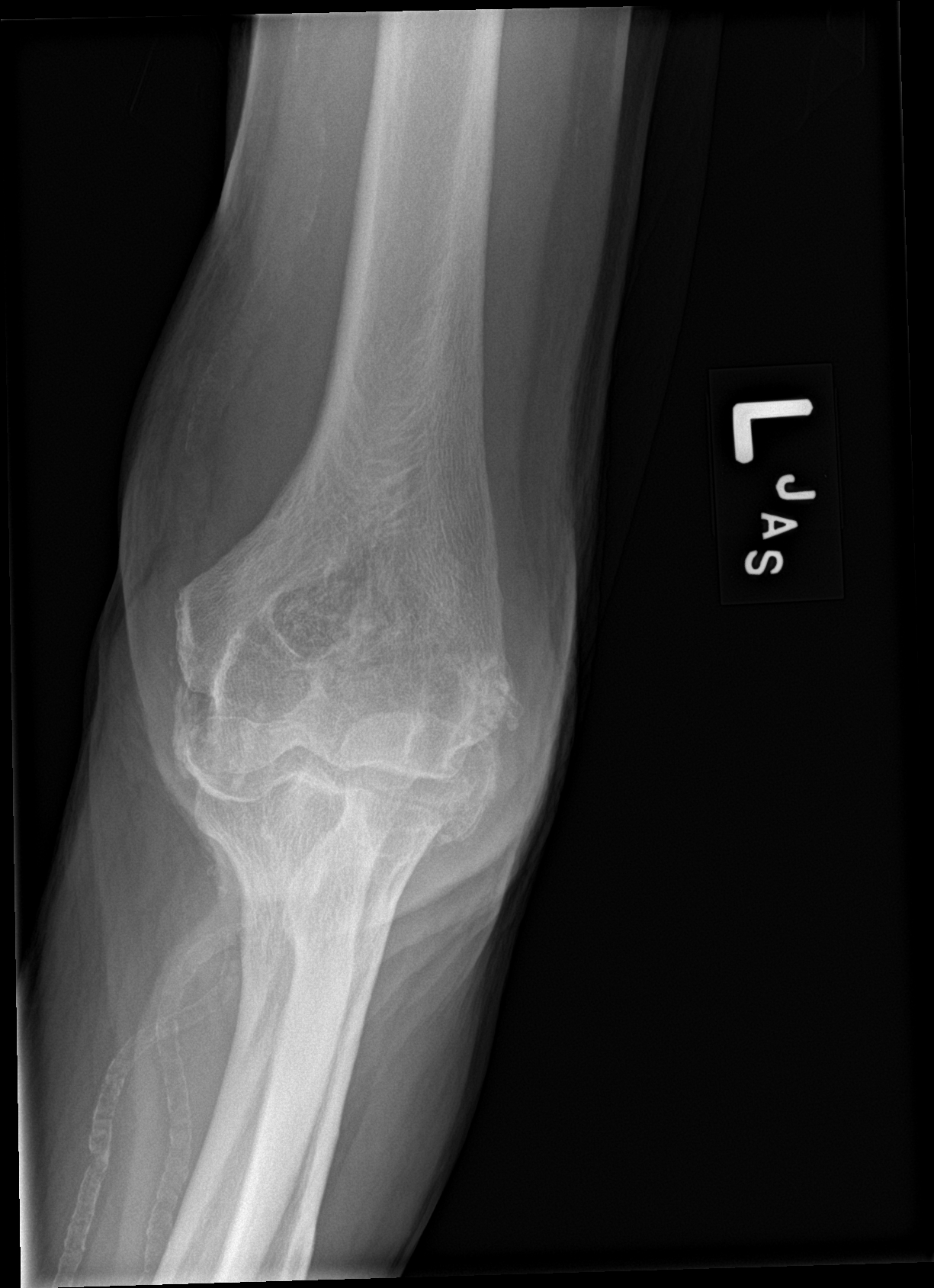

[elbow obl (2 of 2)]
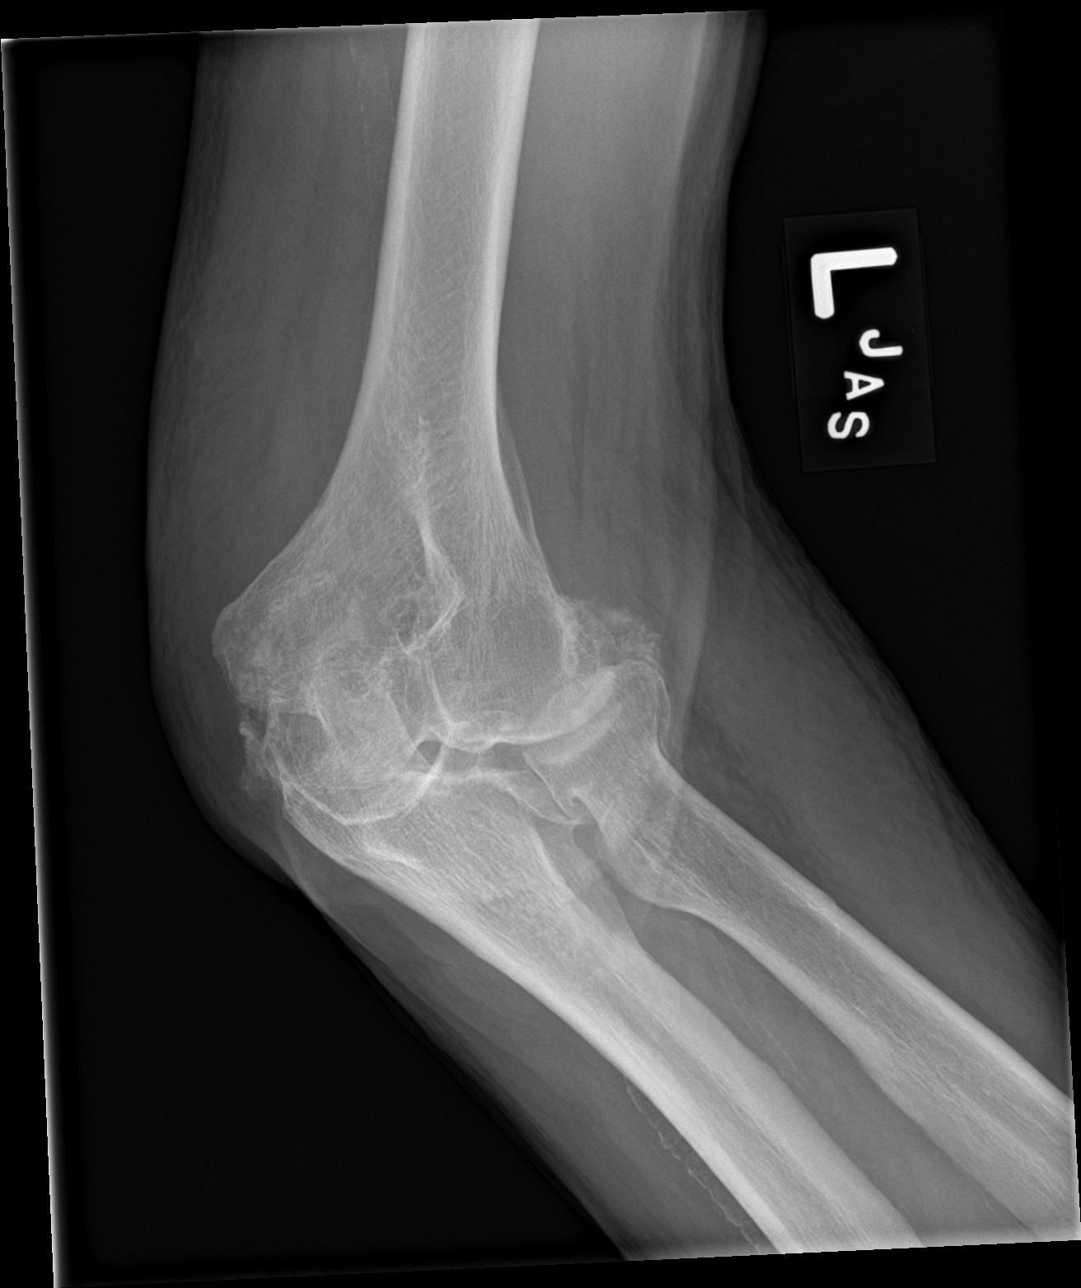

[elbow lat]
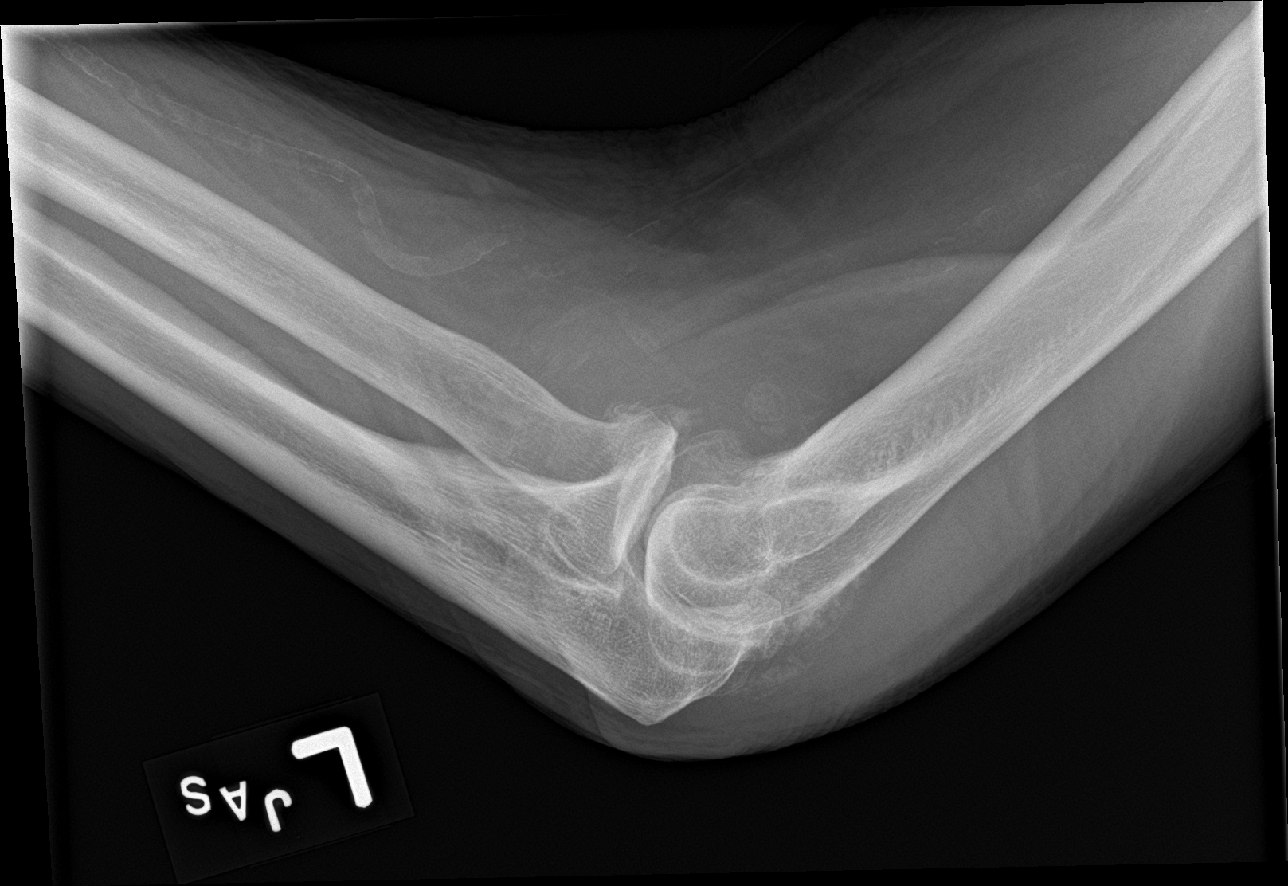

[4 of 4 positions shown; findings below may reference images not displayed]

FINDINGS: Cortical irregularity about the radial head could reflect a fracture
of indeterminate age. Evaluation for elbow joint effusion is
suboptimal due to scattered degenerative fragments about the joint
space.

Degenerative change is noted about the olecranon, with scattered
capsular calcification. Diffuse vascular calcifications are seen.
IMPRESSION: 1. Cortical irregularity about the radial head could reflect a
fracture of indeterminate age. Underlying degenerative changes noted
about the elbow.
2. Diffuse vascular calcifications seen.

## 2019-04-08 ENCOUNTER — Other Ambulatory Visit: Payer: Self-pay | Admitting: Internal Medicine

## 2019-04-08 ENCOUNTER — Other Ambulatory Visit: Payer: Self-pay | Admitting: Family

## 2019-04-08 MED ORDER — HYDROCODONE-ACETAMINOPHEN 10-325 MG PO TABS: 1.0000 | ORAL_TABLET | Freq: Every day | ORAL | 0 refills | Status: DC | PRN

## 2019-04-08 MED ORDER — FLUTICASONE PROPIONATE 50 MCG/ACT NA SUSP: 2.0000 | Freq: Once | NASAL | 0 refills | Status: DC

## 2019-04-08 NOTE — Telephone Encounter (Signed)
Done erx 

## 2019-04-18 ENCOUNTER — Encounter: Payer: Self-pay | Admitting: Internal Medicine

## 2019-04-29 ENCOUNTER — Encounter: Payer: Self-pay | Admitting: Internal Medicine

## 2019-04-29 ENCOUNTER — Ambulatory Visit (INDEPENDENT_AMBULATORY_CARE_PROVIDER_SITE_OTHER): Payer: Medicare Other | Admitting: Internal Medicine

## 2019-04-29 ENCOUNTER — Other Ambulatory Visit: Payer: Self-pay

## 2019-04-29 VITALS — BP 148/82 | HR 90 | Temp 97.9°F | Ht 70.0 in | Wt 173.8 lb

## 2019-04-29 DIAGNOSIS — E538 Deficiency of other specified B group vitamins: Secondary | ICD-10-CM

## 2019-04-29 DIAGNOSIS — E611 Iron deficiency: Secondary | ICD-10-CM

## 2019-04-29 DIAGNOSIS — Z0000 Encounter for general adult medical examination without abnormal findings: Secondary | ICD-10-CM | POA: Diagnosis not present

## 2019-04-29 DIAGNOSIS — M109 Gout, unspecified: Secondary | ICD-10-CM | POA: Diagnosis not present

## 2019-04-29 DIAGNOSIS — Z0001 Encounter for general adult medical examination with abnormal findings: Secondary | ICD-10-CM

## 2019-04-29 DIAGNOSIS — R739 Hyperglycemia, unspecified: Secondary | ICD-10-CM | POA: Diagnosis not present

## 2019-04-29 DIAGNOSIS — E559 Vitamin D deficiency, unspecified: Secondary | ICD-10-CM

## 2019-04-29 DIAGNOSIS — E785 Hyperlipidemia, unspecified: Secondary | ICD-10-CM

## 2019-04-29 DIAGNOSIS — K589 Irritable bowel syndrome without diarrhea: Secondary | ICD-10-CM

## 2019-04-29 DIAGNOSIS — R5383 Other fatigue: Secondary | ICD-10-CM

## 2019-04-29 DIAGNOSIS — I2510 Atherosclerotic heart disease of native coronary artery without angina pectoris: Secondary | ICD-10-CM

## 2019-04-29 LAB — URINALYSIS, ROUTINE W REFLEX MICROSCOPIC
Bilirubin Urine: NEGATIVE
Hgb urine dipstick: NEGATIVE
Ketones, ur: NEGATIVE
Leukocytes,Ua: NEGATIVE
Nitrite: NEGATIVE
Specific Gravity, Urine: 1.015 (ref 1.000–1.030)
Total Protein, Urine: NEGATIVE
Urine Glucose: NEGATIVE
Urobilinogen, UA: 0.2 (ref 0.0–1.0)
pH: 5.5 (ref 5.0–8.0)

## 2019-04-29 LAB — VITAMIN D 25 HYDROXY (VIT D DEFICIENCY, FRACTURES): VITD: 43.39 ng/mL (ref 30.00–100.00)

## 2019-04-29 LAB — CBC WITH DIFFERENTIAL/PLATELET
Basophils Absolute: 0 10*3/uL (ref 0.0–0.1)
Basophils Relative: 0.2 % (ref 0.0–3.0)
Eosinophils Absolute: 0.1 10*3/uL (ref 0.0–0.7)
Eosinophils Relative: 1.8 % (ref 0.0–5.0)
HCT: 41.5 % (ref 39.0–52.0)
Hemoglobin: 14 g/dL (ref 13.0–17.0)
Lymphocytes Relative: 40.6 % (ref 12.0–46.0)
Lymphs Abs: 2.6 10*3/uL (ref 0.7–4.0)
MCHC: 33.6 g/dL (ref 30.0–36.0)
MCV: 97.6 fl (ref 78.0–100.0)
Monocytes Absolute: 0.7 10*3/uL (ref 0.1–1.0)
Monocytes Relative: 10.9 % (ref 3.0–12.0)
Neutro Abs: 3 10*3/uL (ref 1.4–7.7)
Neutrophils Relative %: 46.5 % (ref 43.0–77.0)
Platelets: 245 10*3/uL (ref 150.0–400.0)
RBC: 4.26 Mil/uL (ref 4.22–5.81)
RDW: 13.7 % (ref 11.5–15.5)
WBC: 6.5 10*3/uL (ref 4.0–10.5)

## 2019-04-29 LAB — BASIC METABOLIC PANEL
BUN: 14 mg/dL (ref 6–23)
CO2: 30 mEq/L (ref 19–32)
Calcium: 9.3 mg/dL (ref 8.4–10.5)
Chloride: 100 mEq/L (ref 96–112)
Creatinine, Ser: 0.86 mg/dL (ref 0.40–1.50)
GFR: 83.69 mL/min (ref >60.00)
Glucose, Bld: 102 mg/dL — ABNORMAL HIGH (ref 70–99)
Potassium: 5.1 mEq/L (ref 3.5–5.1)
Sodium: 134 mEq/L — ABNORMAL LOW (ref 135–145)

## 2019-04-29 LAB — LIPID PANEL
Cholesterol: 104 mg/dL (ref 0–200)
HDL: 43.9 mg/dL (ref >39.00)
LDL Cholesterol: 47 mg/dL (ref 0–99)
NonHDL: 60.46
Total CHOL/HDL Ratio: 2
Triglycerides: 65 mg/dL (ref 0.0–149.0)
VLDL: 13 mg/dL (ref 0.0–40.0)

## 2019-04-29 LAB — IBC PANEL
Iron: 91 ug/dL (ref 42–165)
Saturation Ratios: 35.5 % (ref 20.0–50.0)
Transferrin: 183 mg/dL — ABNORMAL LOW (ref 212.0–360.0)

## 2019-04-29 LAB — HEPATIC FUNCTION PANEL
ALT: 20 U/L (ref 0–53)
AST: 20 U/L (ref 0–37)
Albumin: 4 g/dL (ref 3.5–5.2)
Alkaline Phosphatase: 92 U/L (ref 39–117)
Bilirubin, Direct: 0.2 mg/dL (ref 0.0–0.3)
Total Bilirubin: 0.8 mg/dL (ref 0.2–1.2)
Total Protein: 6.8 g/dL (ref 6.0–8.3)

## 2019-04-29 LAB — HEMOGLOBIN A1C: Hgb A1c MFr Bld: 5.7 % (ref 4.6–6.5)

## 2019-04-29 LAB — URIC ACID: Uric Acid, Serum: 3.2 mg/dL — ABNORMAL LOW (ref 4.0–7.8)

## 2019-04-29 LAB — TSH: TSH: 0.5 u[IU]/mL (ref 0.35–4.50)

## 2019-04-29 LAB — VITAMIN B12: Vitamin B-12: 423 pg/mL (ref 211–911)

## 2019-04-29 NOTE — Progress Notes (Signed)
Subjective:    Patient ID: Jonathan Orozco, male    DOB: Jan 04, 1929, 86 y.o.   MRN: [id number]  HPI  Here with wife, Here for wellness and f/u;  Overall doing ok;  Pt denies Chest pain, worsening SOB, DOE, wheezing, orthopnea, PND, worsening LE edema, palpitations, dizziness or syncope.  Pt denies neurological change such as new headache, facial or extremity weakness.  Pt denies polydipsia, polyuria, or low sugar symptoms. Pt states overall good compliance with treatment and medications, good tolerability, and has been trying to follow appropriate diet.  Pt denies worsening depressive symptoms, suicidal ideation or panic. No fever, night sweats, wt loss, loss of appetite, or other constitutional symptoms.  Pt states good ability with ADL's, has low fall risk, home safety reviewed and adequate, no other significant changes in  Vision though hearing has gotten worse, and only occasionally active with exercise. Does c/o ongoing fatigue, but denies signficant daytime hypersomnolence. "Feels like I did before I had heart disease before." has not seen cardiology except for ppm purpose per wife recently.  Also had recent fluid taken off right elbow c/w gout, not resolved.  Also with recurring ibS like symptoms and plans f/u with Dr Perry/GI Past Medical History:  Diagnosis Date  . ABUSE, ALCOHOL, IN REMISSION 04/08/2007  . ALLERGIC RHINITIS 09/29/2006  . Arthritis    "hands" (07/02/2016)  . Atrial fibrillation ([place])   . Atrial fibrillation with RVR ([place]) 07/02/2016  . Atrial flutter ([place]) 03/28/2010   a. s/p prior rfca;  b. recurrent paroxysmal flutter 04/2012;  c. pradaxa initiated 04/2012.  [name] BENIGN PROSTATIC HYPERTROPHY 09/29/2006  . BPPV (benign paroxysmal positional vertigo) 04/08/2007  . CAD (coronary artery disease)    a. LHC 2/17: EF 55-65%, LM 75, pLAD 75, oD1 75, oD2 65, D3 85, oLCx 99, oOM1 75, pRCA 25 >> CABG  . Carotid stenosis    a. Carotid US 2/17:  Bilateral ICA 1-39% ICA  . Chronic lower back  pain   . COLONIC POLYPS, HX OF 06/23/2007  . 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 Diverticulosis   . ERECTILE DYSFUNCTION 09/29/2006  . GERD (gastroesophageal reflux disease)   . GLUCOSE INTOLERANCE 03/19/2010  . HYPERLIPIDEMIA 09/29/2006  . HYPERTENSION 09/29/2006  . Long term (current) use of anticoagulants 04/26/2010  . MELANOMA, MALIGNANT, SKIN NOS 09/29/2006   other skin cancers -no further melanoma  . OSTEOARTHROSIS NOS, OTHER [facility] SITE 09/29/2006  . Other specified forms of hearing loss 08/10/2009  . Pneumonia ~ 1969  . Presence of permanent cardiac pacemaker 06/02/2016  . VENTRICULAR TACHYCARDIA 09/29/2006   Past Surgical History:  Procedure Laterality Date  . AV NODE ABLATION  07/02/2016  . AV NODE ABLATION N/A 07/02/2016   Procedure: AV Node Ablation;  Surgeon: Deboraha Sprang, MD;  Location: [place] CV LAB;  Service: Cardiovascular;  Laterality: N/A;  . CARDIAC CATHETERIZATION N/A 03/12/2015   Procedure: Left Heart Cath and Coronary Angiography;  Surgeon: [name], MD;  Location: [place] CV LAB;  Service: Cardiovascular;  Laterality: N/A;  . CARDIOVERSION N/A 12/19/2015   Procedure: CARDIOVERSION;  Surgeon: [name], MD;  Location: [facility] ENDOSCOPY;  Service: Cardiovascular;  Laterality: N/A;  . CARDIOVERSION N/A 03/24/2016   Procedure: CARDIOVERSION;  Surgeon: [name], MD;  Location: [place];  Service: Cardiovascular;  Laterality: N/A;  . CATARACT EXTRACTION W/ INTRAOCULAR LENS  IMPLANT, BILATERAL Bilateral   . CLIPPING OF ATRIAL APPENDAGE N/A 03/14/2015   Procedure: CLIPPING OF ATRIAL  APPENDAGE;  Surgeon: [name], MD;  Location: [place];  Service: Open Heart Surgery;  Laterality: N/A;  . COLONOSCOPY WITH PROPOFOL N/A 12/08/2014   Procedure: COLONOSCOPY WITH PROPOFOL;  Surgeon: [name], MD;  Location: [facility] ENDOSCOPY;  Service: Endoscopy;  Laterality: N/A;  . CORONARY ARTERY BYPASS GRAFT N/A 03/14/2015   Procedure: CORONARY ARTERY BYPASS  GRAFTING (CABG) x 4 (LIMA to LAD, SVG to DIAGONAL 2, SVG SEQUENTIALLY to OM1 and OM2);  Surgeon: [name], MD;  Location: [place];  Service: Open Heart Surgery;  Laterality: N/A;  . INCISION AND DRAINAGE N/A 07/03/2016   Procedure: INCISION AND DRAINAGE of CHEST ABSCESS;  Surgeon: [name], MD;  Location: [place];  Service: Thoracic;  Laterality: N/A;  . INSERT / REPLACE / REMOVE PACEMAKER  06/02/2016  . JOINT REPLACEMENT    . KNEE ARTHROSCOPY Left   . MELANOMA EXCISION     "upper back"  . PACEMAKER IMPLANT N/A 06/02/2016   Procedure: Pacemaker Implant;  Surgeon: Deboraha Sprang, MD;  Location: [place] CV LAB;  Service: Cardiovascular;  Laterality: N/A;  . SHOULDER OPEN ROTATOR CUFF REPAIR Left   . SKIN CANCER EXCISION Right    "cheek; real deep"  . TEE WITHOUT CARDIOVERSION N/A 03/14/2015   Procedure: TRANSESOPHAGEAL ECHOCARDIOGRAM (TEE);  Surgeon: [name], MD;  Location: [place];  Service: Open Heart Surgery;  Laterality: N/A;  . TEE WITHOUT CARDIOVERSION N/A 12/19/2015   Procedure: TRANSESOPHAGEAL ECHOCARDIOGRAM (TEE);  Surgeon: [name], MD;  Location: [place];  Service: Cardiovascular;  Laterality: N/A;  . TEE WITHOUT CARDIOVERSION N/A 03/24/2016   Procedure: TRANSESOPHAGEAL ECHOCARDIOGRAM (TEE);  Surgeon: [name], MD;  Location: [place];  Service: Cardiovascular;  Laterality: N/A;  . TONSILLECTOMY    . TOTAL KNEE ARTHROPLASTY Right     reports that he has quit smoking. His smoking use included cigarettes. He has a 52.00 pack-year smoking history. He has never used smokeless tobacco. He reports that he does not drink alcohol or use drugs. family history includes Diabetes in his brother; Esophageal cancer in his father. Allergies  Allergen Reactions  . Amiodarone Hcl Other (See Comments)    Patient reports photosensitivity  . Ace Inhibitors Cough   Current Outpatient Medications on File Prior to Visit  Medication Sig Dispense  Refill  . apixaban (ELIQUIS) 5 MG TABS tablet Take 1 tablet (5 mg total) by mouth 2 (two) times daily. 180 tablet 1  . atorvastatin (LIPITOR) 10 MG tablet TAKE 1 TABLET DAILY 90 tablet 3  . Benzethonium Cl & Zinc Oxide 0.13 & 10 % KIT Apply topically.    [name] doxylamine, Sleep, (UNISOM) 25 MG tablet Take 25 mg by mouth at bedtime.    [name] HYDROcodone-acetaminophen (NORCO) 10-325 MG tablet Take 1 tablet by mouth daily as needed. 30 tablet 0  . Multiple Vitamin (MULTIVITAMIN) tablet Take 1 tablet by mouth daily.      . mupirocin cream (BACTROBAN) 2 % Apply 1 application topically 2 (two) times daily. 15 g 0  . mupirocin ointment (BACTROBAN) 2 % Place 1 application into the nose 2 (two) times daily. 22 g 2  . omega-3 acid ethyl esters (LOVAZA) 1 g capsule Take by mouth.    . Psyllium (METAMUCIL PO) 1 TABLESPOON IN A GLASS OF WATER ONCE DAILY    . tamsulosin (FLOMAX) 0.4 MG CAPS capsule TAKE 1 CAPSULE DAILY 90 capsule 3  . triamcinolone (NASACORT) 55 MCG/ACT AERO nasal inhaler Place 2 sprays  into the nose daily. 1 Inhaler 12  . ZINC OXIDE EX Apply 1 application topically as needed (itching).    . fluticasone (FLONASE) 50 MCG/ACT nasal spray Place 2 sprays into both nostrils once for 1 dose. 16 g 0   No current facility-administered medications on file prior to visit.   Review of Systems All otherwise neg per pt     Objective:   Physical Exam BP (!) 148/82   Pulse 90   Temp 97.9 F (36.6 C)   Ht '5\' 10"'$  (1.778 m)   Wt 173 lb 12.8 oz (78.8 kg)   SpO2 99%   BMI 24.94 kg/m  VS noted,  Constitutional: Pt appears in NAD HENT: Head: NCAT.  Right Ear: External ear normal.  Left Ear: External ear normal.  Eyes: . Pupils are equal, round, and reactive to light. Conjunctivae and EOM are normal Nose: without d/c or deformity Neck: Neck supple. Gross normal ROM Cardiovascular: Normal rate and regular rhythm.   Pulmonary/Chest: Effort normal and breath sounds without rales or wheezing.  Abd:  Soft,  NT, ND, + BS, no organomegaly Neurological: Pt is alert. At baseline orientation, motor grossly intact Skin: Skin is warm. No rashes, other new lesions, no LE edema Psychiatric: Pt behavior is normal without agitation  All otherwise neg per pt Lab Results  Component Value Date   WBC 6.5 04/29/2019   HGB 14.0 04/29/2019   HCT 41.5 04/29/2019   PLT 245.0 04/29/2019   GLUCOSE 102 (H) 04/29/2019   CHOL 104 04/29/2019   TRIG 65.0 04/29/2019   HDL 43.90 04/29/2019   LDLDIRECT 159.2 09/29/2006   LDLCALC 47 04/29/2019   ALT 20 04/29/2019   AST 20 04/29/2019   NA 134 (L) 04/29/2019   K 5.1 04/29/2019   CL 100 04/29/2019   CREATININE 0.86 04/29/2019   BUN 14 04/29/2019   CO2 30 04/29/2019   TSH 0.50 04/29/2019   PSA 3.75 06/12/2016   INR 3.1 (A) 06/01/2018   HGBA1C 5.7 04/29/2019      Assessment & Plan:

## 2019-04-29 NOTE — Patient Instructions (Signed)
Please continue all other medications as before, and refills have been done if requested.  Please have the pharmacy call with any other refills you may need.  Please continue your efforts at being more active, low cholesterol diet, and weight control.  You are otherwise up to date with prevention measures today.  Please keep your appointments with your specialists as you may have planned - Dr Marjorie Smolder will be contacted regarding the referral for: Cardiology  Please go to the LAB at the blood drawing area for the tests to be done  You will be contacted by phone if any changes need to be made immediately.  Otherwise, you will receive a letter about your results with an explanation, but please check with MyChart first.  Please remember to sign up for MyChart if you have not done so, as this will be important to you in the future with finding out test results, communicating by private email, and scheduling acute appointments online when needed.  Please make an Appointment to return in 6 months, or sooner if needed

## 2019-05-01 ENCOUNTER — Encounter: Payer: Self-pay | Admitting: Internal Medicine

## 2019-05-01 NOTE — Assessment & Plan Note (Signed)
stable overall by history and exam, recent data reviewed with pt, and pt to continue medical treatment as before,  to f/u any worsening symptoms or concerns  

## 2019-05-01 NOTE — Assessment & Plan Note (Addendum)
Recent right elbow now resolved, ok to follow, consider allopurinol for recurrence  I spent 32 minutes in preparing to see the patient by review of recent labs, imaging and procedures, obtaining and reviewing separately obtained history, communicating with the patient and family or caregiver, ordering medications, tests or procedures, and documenting clinical information in the EHR including the differential Dx, treatment, and any further evaluation and other management of acute gouty arthritis, fatigue, hyperglycemia, HLD, IBS, CAD

## 2019-05-01 NOTE — Assessment & Plan Note (Signed)
For cardiology referral °

## 2019-05-01 NOTE — Assessment & Plan Note (Signed)
Pt has f/u appt with gi

## 2019-05-01 NOTE — Assessment & Plan Note (Signed)
Etiology unclear, Exam otherwise benign, to check labs as documented, follow with expectant management  

## 2019-05-24 ENCOUNTER — Ambulatory Visit (INDEPENDENT_AMBULATORY_CARE_PROVIDER_SITE_OTHER): Payer: Medicare Other | Admitting: Internal Medicine

## 2019-05-24 ENCOUNTER — Encounter: Payer: Self-pay | Admitting: Internal Medicine

## 2019-05-24 VITALS — BP 124/72 | HR 98 | Temp 97.8°F | Ht 70.0 in | Wt 172.8 lb

## 2019-05-24 DIAGNOSIS — K219 Gastro-esophageal reflux disease without esophagitis: Secondary | ICD-10-CM

## 2019-05-24 DIAGNOSIS — R198 Other specified symptoms and signs involving the digestive system and abdomen: Secondary | ICD-10-CM | POA: Diagnosis not present

## 2019-05-24 MED ORDER — OMEPRAZOLE 40 MG PO CPDR: 40.0000 mg | DELAYED_RELEASE_CAPSULE | Freq: Every day | ORAL | 11 refills | Status: DC

## 2019-05-24 NOTE — Progress Notes (Signed)
HISTORY OF PRESENT ILLNESS:  Jonathan Orozco is a 86 y.o. male with multiple medical problems as listed below who schedules his own appointment today thinking that he has irritable bowel syndrome which is leading to his chief complaint of fatigue.  He is accompanied by his wife.  He was evaluated May 30, 2014 regarding alternating bowel habits and lower abdominal discomfort.  As well increased intestinal gas and bloating.  Patient has undergone multiple colonoscopies previously.  He was treated previously with fiber and probiotic which helped.  Patient tells me that his bowel habits have been much better.  He describes 2 or 3 bowel movements per day.  No straining.  No incontinence.  No abdominal pain or bloating.  He feels that he might have irritable bowel syndrome and picked up a book "IBS for Dummies".  He noticed and there that of the myriad of potential associated symptoms, was fatigue.  Next, he reports active problems with reflux.  His wife reports to he has good appetite.  No nausea or vomiting.  No dysphagia.  Weight has been stable.  No melena or hematochezia.  Review of outside blood work from April 29, 2019 shows unremarkable comprehensive metabolic panel.  Normal albumin of 4.0.  Normal CBC with hemoglobin 14.0.  Abdominal x-rays from October 2019 for alternating bowel habits revealed evidence of constipation.  Last complete colonoscopy by Dr. [name] November 2016 revealed diverticulosis, incidental lipoma, and diminutive adenomas.  Patient does tell me that he has an upcoming cardiology appointment regarding his complaints of fatigue  REVIEW OF SYSTEMS:  All non-GI ROS negative unless otherwise stated in the HPI except for hearing problems, back pain, anxiety, sleeping problems  Past Medical History:  Diagnosis Date  . ABUSE, ALCOHOL, IN REMISSION 04/08/2007  . ALLERGIC RHINITIS 09/29/2006  . Arthritis    "hands" (07/02/2016)  . Atrial fibrillation ([place])   . Atrial fibrillation with  RVR ([place]) 07/02/2016  . Atrial flutter ([place]) 03/28/2010   a. s/p prior rfca;  b. recurrent paroxysmal flutter 04/2012;  c. pradaxa initiated 04/2012.  [name] BENIGN PROSTATIC HYPERTROPHY 09/29/2006  . BPPV (benign paroxysmal positional vertigo) 04/08/2007  . CAD (coronary artery disease)    a. LHC 2/17: EF 55-65%, LM 75, pLAD 75, oD1 75, oD2 65, D3 85, oLCx 99, oOM1 75, pRCA 25 >> CABG  . Carotid stenosis    a. Carotid US 2/17:  Bilateral ICA 1-39% ICA  . Chronic lower back pain   . COLONIC POLYPS, HX OF 06/23/2007  . 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 Diverticulosis   . ERECTILE DYSFUNCTION 09/29/2006  . GERD (gastroesophageal reflux disease)   . GLUCOSE INTOLERANCE 03/19/2010  . HYPERLIPIDEMIA 09/29/2006  . HYPERTENSION 09/29/2006  . Long term (current) use of anticoagulants 04/26/2010  . MELANOMA, MALIGNANT, SKIN NOS 09/29/2006   other skin cancers -no further melanoma  . OSTEOARTHROSIS NOS, OTHER [facility] SITE 09/29/2006  . Other specified forms of hearing loss 08/10/2009  . Pneumonia ~ 1969  . Presence of permanent cardiac pacemaker 06/02/2016  . VENTRICULAR TACHYCARDIA 09/29/2006    Past Surgical History:  Procedure Laterality Date  . AV NODE ABLATION  07/02/2016  . AV NODE ABLATION N/A 07/02/2016   Procedure: AV Node Ablation;  Surgeon: Deboraha Sprang, MD;  Location: [place] CV LAB;  Service: Cardiovascular;  Laterality: N/A;  . CARDIAC CATHETERIZATION N/A 03/12/2015   Procedure: Left Heart Cath and Coronary Angiography;  Surgeon: [name], MD;  Location: [place] CV  LAB;  Service: Cardiovascular;  Laterality: N/A;  . CARDIOVERSION N/A 12/19/2015   Procedure: CARDIOVERSION;  Surgeon: [name], MD;  Location: [facility] ENDOSCOPY;  Service: Cardiovascular;  Laterality: N/A;  . CARDIOVERSION N/A 03/24/2016   Procedure: CARDIOVERSION;  Surgeon: [name], MD;  Location: [place];  Service: Cardiovascular;  Laterality: N/A;  . CATARACT EXTRACTION W/ INTRAOCULAR LENS   IMPLANT, BILATERAL Bilateral   . CLIPPING OF ATRIAL APPENDAGE N/A 03/14/2015   Procedure: CLIPPING OF ATRIAL APPENDAGE;  Surgeon: [name], MD;  Location: [place];  Service: Open Heart Surgery;  Laterality: N/A;  . COLONOSCOPY WITH PROPOFOL N/A 12/08/2014   Procedure: COLONOSCOPY WITH PROPOFOL;  Surgeon: [name], MD;  Location: [facility] ENDOSCOPY;  Service: Endoscopy;  Laterality: N/A;  . CORONARY ARTERY BYPASS GRAFT N/A 03/14/2015   Procedure: CORONARY ARTERY BYPASS GRAFTING (CABG) x 4 (LIMA to LAD, SVG to DIAGONAL 2, SVG SEQUENTIALLY to OM1 and OM2);  Surgeon: [name], MD;  Location: [place];  Service: Open Heart Surgery;  Laterality: N/A;  . INCISION AND DRAINAGE N/A 07/03/2016   Procedure: INCISION AND DRAINAGE of CHEST ABSCESS;  Surgeon: [name], MD;  Location: [place];  Service: Thoracic;  Laterality: N/A;  . INSERT / REPLACE / REMOVE PACEMAKER  06/02/2016  . JOINT REPLACEMENT    . KNEE ARTHROSCOPY Left   . MELANOMA EXCISION     "upper back"  . PACEMAKER IMPLANT N/A 06/02/2016   Procedure: Pacemaker Implant;  Surgeon: Deboraha Sprang, MD;  Location: [place] CV LAB;  Service: Cardiovascular;  Laterality: N/A;  . SHOULDER OPEN ROTATOR CUFF REPAIR Left   . SKIN CANCER EXCISION Right    "cheek; real deep"  . TEE WITHOUT CARDIOVERSION N/A 03/14/2015   Procedure: TRANSESOPHAGEAL ECHOCARDIOGRAM (TEE);  Surgeon: [name], MD;  Location: [place];  Service: Open Heart Surgery;  Laterality: N/A;  . TEE WITHOUT CARDIOVERSION N/A 12/19/2015   Procedure: TRANSESOPHAGEAL ECHOCARDIOGRAM (TEE);  Surgeon: [name], MD;  Location: [place];  Service: Cardiovascular;  Laterality: N/A;  . TEE WITHOUT CARDIOVERSION N/A 03/24/2016   Procedure: TRANSESOPHAGEAL ECHOCARDIOGRAM (TEE);  Surgeon: [name], MD;  Location: [place];  Service: Cardiovascular;  Laterality: N/A;  . TONSILLECTOMY    . TOTAL KNEE ARTHROPLASTY Right     Social History Jonathan Orozco  reports that he has quit smoking. His smoking use included cigarettes. He has a 52.00 pack-year smoking history. He has never used smokeless tobacco. He reports that he does not drink alcohol or use drugs.  family history includes Diabetes in his brother; Esophageal cancer in his father.  Allergies  Allergen Reactions  . Amiodarone Hcl Other (See Comments)    Patient reports photosensitivity  . Ace Inhibitors Cough       PHYSICAL EXAMINATION: Vital signs: BP 124/72   Pulse 98   Temp 97.8 F (36.6 C)   Ht '5\' 10"'$  (1.778 m)   Wt 172 lb 12.8 oz (78.4 kg)   BMI 24.79 kg/m   Constitutional: generally well-appearing, no acute distress.  Hard of hearing Psychiatric: alert and oriented x3, cooperative Eyes: extraocular movements intact, anicteric, conjunctiva pink Mouth: oral pharynx moist, no lesions Neck: supple no lymphadenopathy Cardiovascular: heart regular rate and rhythm, no murmur Lungs: clear to auscultation bilaterally Abdomen: soft, nontender, nondistended, no obvious ascites, no peritoneal signs, normal bowel sounds, no organomegaly Rectal: Omitted Extremities: no clubbing, cyanosis, or lower extremity edema bilaterally Skin: no lesions on visible extremities  Neuro: No focal deficits.  Cranial nerves intact  ASSESSMENT:  1.  History of irregular bowel habits.  Improved with daily fiber supplementation.  Currently on Metamucil 1 tablespoon 3 times daily.  This patient does not have irritable bowel syndrome.  This patient's complaints of decreased energy and/or fatigue are not related to any primary GI problem. 2.  History of diminutive adenomas.  Last colonoscopy 2016.  Diverticulosis as well.  Aged out of surveillance 3.  GERD.  Active symptoms.  Requiring regular use of over-the-counter acid suppressing agents  PLAN:  1.  Reassurance regarding his bowels 2.  Continue Metamucil daily 3.  Reflux precautions 4.  Prescribed omeprazole 40 mg daily.  Multiple  refills.  Medication risks reviewed 5.  Resume general medical care with PCP A total time of 45 minutes was spent preparing to see the patient, reviewing laboratories and x-rays, obtaining comprehensive history and reviewing outside history is, performing comprehensive physical examination, counseling the patient regarding his above listed diagnoses, answering questions from he and his wife, ordering medications, and documenting clinical information in the health record

## 2019-05-24 NOTE — Patient Instructions (Addendum)
If you are age 84 or older, your body mass index should be between 23-30. Your Body mass index is 24.79 kg/m. If this is out of the aforementioned range listed, please consider follow up with your Primary Care Provider.  If you are age 78 or younger, your body mass index should be between 19-25. Your Body mass index is 24.79 kg/m. If this is out of the aformentioned range listed, please consider follow up with your Primary Care Provider.   We have sent the following medications to your pharmacy for you to pick up at your convenience: Omeprazole 40 mg  Continue Metamucil for bowels.  Follow up as needed.  Thank you for choosing me and [facility].   Scarlette Shorts, MD

## 2019-05-26 ENCOUNTER — Telehealth: Payer: Self-pay | Admitting: Interventional Cardiology

## 2019-05-26 NOTE — Telephone Encounter (Signed)
Patient's wife, Barnetta Chapel, states she is requesting to accompany the patient during his appointment scheduled for 05/30/19 at 2:20 PM with Dr. Irish Lack. She states due to hearing impairment she will need to assist him with interpreting what Dr. Irish Lack is saying. Please advise.

## 2019-05-26 NOTE — Telephone Encounter (Signed)
Called and spoke to patient and his wife. He does not hear well and has trouble understanding at times. Made wife aware that she can come up with him at his visit.

## 2019-05-27 ENCOUNTER — Telehealth: Payer: Self-pay | Admitting: Internal Medicine

## 2019-05-27 ENCOUNTER — Emergency Department (HOSPITAL_COMMUNITY)
Admission: EM | Admit: 2019-05-27 | Discharge: 2019-05-27 | Disposition: A | Discharge status: Home / Self Care | Payer: Medicare Other | Attending: Emergency Medicine | Admitting: Emergency Medicine

## 2019-05-27 ENCOUNTER — Other Ambulatory Visit: Payer: Self-pay

## 2019-05-27 DIAGNOSIS — Z79899 Other long term (current) drug therapy: Secondary | ICD-10-CM | POA: Diagnosis not present

## 2019-05-27 DIAGNOSIS — Z951 Presence of aortocoronary bypass graft: Secondary | ICD-10-CM | POA: Diagnosis not present

## 2019-05-27 DIAGNOSIS — I4891 Unspecified atrial fibrillation: Secondary | ICD-10-CM | POA: Insufficient documentation

## 2019-05-27 DIAGNOSIS — Z7901 Long term (current) use of anticoagulants: Secondary | ICD-10-CM | POA: Insufficient documentation

## 2019-05-27 DIAGNOSIS — I2510 Atherosclerotic heart disease of native coronary artery without angina pectoris: Secondary | ICD-10-CM | POA: Diagnosis not present

## 2019-05-27 DIAGNOSIS — R040 Epistaxis: Secondary | ICD-10-CM | POA: Diagnosis present

## 2019-05-27 DIAGNOSIS — Z87891 Personal history of nicotine dependence: Secondary | ICD-10-CM | POA: Diagnosis not present

## 2019-05-27 MED ORDER — TRANEXAMIC ACID FOR EPISTAXIS
500.0000 mg | Freq: Once | TOPICAL | Status: AC
  Administered 2019-05-27: 500 mg via TOPICAL
  Filled 2019-05-27: qty 10

## 2019-05-27 MED ORDER — CEPHALEXIN 500 MG PO CAPS
500.0000 mg | ORAL_CAPSULE | Freq: Three times a day (TID) | ORAL | 0 refills | Status: DC
Start: 2019-05-27 — End: 2019-07-14

## 2019-05-27 MED ORDER — CEPHALEXIN 500 MG PO CAPS
500.0000 mg | ORAL_CAPSULE | Freq: Once | ORAL | Status: AC
  Administered 2019-05-27: 500 mg via ORAL
  Filled 2019-05-27: qty 1

## 2019-05-27 MED ORDER — OXYMETAZOLINE HCL 0.05 % NA SOLN
2.0000 | Freq: Once | NASAL | Status: AC
  Administered 2019-05-27: 2 via NASAL
  Filled 2019-05-27: qty 30

## 2019-05-27 MED ORDER — SILVER NITRATE-POT NITRATE 75-25 % EX MISC
1.0000 | Freq: Once | CUTANEOUS | Status: DC
  Filled 2019-05-27: qty 10

## 2019-05-27 NOTE — Telephone Encounter (Signed)
Dr. Jenny Reichmann was informed of information and advised verbally that pt go to ER for his nose bleed.

## 2019-05-27 NOTE — ED Provider Notes (Signed)
[facility] DEPT Provider Note   CSN: [id number] Arrival date & time: 05/27/19  1310     History Chief Complaint  Patient presents with  . Epistaxis    Jonathan Orozco is a 86 y.o. male.  Patient is an 86 year old male with past medical history of atrial fibrillation on Eliquis, coronary artery disease, hypertension, hyperlipidemia. He presents today for evaluation of nosebleed. Patient states he was walking in his home today when he felt a trickle of blood start from his nose. This began in the absence of any injury or trauma. Patient was given Afrin by EMS and gauze was placed into each nostril. Bleeding seems to have stopped.  The history is provided by the patient.  Epistaxis Location:  L nare Severity:  Moderate Timing:  Constant Progression:  Improving Chronicity:  New Context: anticoagulants   Relieved by:  Nothing Worsened by:  Nothing      Past Medical History:  Diagnosis Date  . ABUSE, ALCOHOL, IN REMISSION 04/08/2007  . ALLERGIC RHINITIS 09/29/2006  . Arthritis    "hands" (07/02/2016)  . Atrial fibrillation ([place])   . Atrial fibrillation with RVR ([place]) 07/02/2016  . Atrial flutter ([place]) 03/28/2010   a. s/p prior rfca;  b. recurrent paroxysmal flutter 04/2012;  c. pradaxa initiated 04/2012.  [name] BENIGN PROSTATIC HYPERTROPHY 09/29/2006  . BPPV (benign paroxysmal positional vertigo) 04/08/2007  . CAD (coronary artery disease)    a. LHC 2/17: EF 55-65%, LM 75, pLAD 75, oD1 75, oD2 65, D3 85, oLCx 99, oOM1 75, pRCA 25 >> CABG  . Carotid stenosis    a. Carotid US 2/17:  Bilateral ICA 1-39% ICA  . Chronic lower back pain   . COLONIC POLYPS, HX OF 06/23/2007  . 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 Diverticulosis   . ERECTILE DYSFUNCTION 09/29/2006  . GERD (gastroesophageal reflux disease)   . GLUCOSE INTOLERANCE 03/19/2010  . HYPERLIPIDEMIA 09/29/2006  . HYPERTENSION 09/29/2006  . Long term (current) use of anticoagulants 04/26/2010  .  MELANOMA, MALIGNANT, SKIN NOS 09/29/2006   other skin cancers -no further melanoma  . OSTEOARTHROSIS NOS, OTHER [facility] SITE 09/29/2006  . Other specified forms of hearing loss 08/10/2009  . Pneumonia ~ 1969  . Presence of permanent cardiac pacemaker 06/02/2016  . VENTRICULAR TACHYCARDIA 09/29/2006    Patient Active Problem List   Diagnosis Date Noted  . Acute gouty arthritis 04/29/2019  . Left arm swelling 01/05/2018  . IBS (irritable bowel syndrome) 06/30/2017  . Encounter for well adult exam with abnormal findings 06/19/2016  . Increased prostate specific antigen (PSA) velocity 06/19/2016  . Gait disorder 01/27/2016  . Chronic anticoagulation 12/18/2015  . Hyperglycemia 06/17/2015  . CAD (coronary artery disease), native coronary artery 03/12/2015  . Atrial fibrillation with rapid ventricular response ([place])   . Solitary pulmonary nodule 05/24/2013  . Anxiety state 05/21/2012  . Chronic low back pain 05/21/2012  . Long term (current) use of anticoagulants 04/24/2012  . Hearing loss of both ears 11/11/2010  . Fatigue 11/16/2009  . VERTIGO 04/08/2007  . MELANOMA, MALIGNANT, SKIN NOS 09/29/2006  . Hyperlipidemia 09/29/2006  . ERECTILE DYSFUNCTION 09/29/2006  . VENTRICULAR TACHYCARDIA 09/29/2006  . BENIGN PROSTATIC HYPERTROPHY 09/29/2006  . OSTEOARTHROSIS NOS, OTHER [name] SITE 09/29/2006    Past Surgical History:  Procedure Laterality Date  . AV NODE ABLATION  07/02/2016  . AV NODE ABLATION N/A 07/02/2016   Procedure: AV Node Ablation;  Surgeon: Deboraha Sprang, MD;  Location: [facility] INVASIVE CV  LAB;  Service: Cardiovascular;  Laterality: N/A;  . CARDIAC CATHETERIZATION N/A 03/12/2015   Procedure: Left Heart Cath and Coronary Angiography;  Surgeon: [name], MD;  Location: [facility] CV LAB;  Service: Cardiovascular;  Laterality: N/A;  . CARDIOVERSION N/A 12/19/2015   Procedure: CARDIOVERSION;  Surgeon: [name], MD;  Location: [facility] ENDOSCOPY;  Service: Cardiovascular;  Laterality:  N/A;  . CARDIOVERSION N/A 03/24/2016   Procedure: CARDIOVERSION;  Surgeon: [name], MD;  Location: [place];  Service: Cardiovascular;  Laterality: N/A;  . CATARACT EXTRACTION W/ INTRAOCULAR LENS  IMPLANT, BILATERAL Bilateral   . CLIPPING OF ATRIAL APPENDAGE N/A 03/14/2015   Procedure: CLIPPING OF ATRIAL APPENDAGE;  Surgeon: [name], MD;  Location: [facility];  Service: Open Heart Surgery;  Laterality: N/A;  . COLONOSCOPY WITH PROPOFOL N/A 12/08/2014   Procedure: COLONOSCOPY WITH PROPOFOL;  Surgeon: [name], MD;  Location: [facility] ENDOSCOPY;  Service: Endoscopy;  Laterality: N/A;  . CORONARY ARTERY BYPASS GRAFT N/A 03/14/2015   Procedure: CORONARY ARTERY BYPASS GRAFTING (CABG) x 4 (LIMA to LAD, SVG to DIAGONAL 2, SVG SEQUENTIALLY to OM1 and OM2);  Surgeon: [name], MD;  Location: [place];  Service: Open Heart Surgery;  Laterality: N/A;  . INCISION AND DRAINAGE N/A 07/03/2016   Procedure: INCISION AND DRAINAGE of CHEST ABSCESS;  Surgeon: [name], MD;  Location: [place];  Service: Thoracic;  Laterality: N/A;  . INSERT / REPLACE / REMOVE PACEMAKER  06/02/2016  . JOINT REPLACEMENT    . KNEE ARTHROSCOPY Left   . MELANOMA EXCISION     "upper back"  . PACEMAKER IMPLANT N/A 06/02/2016   Procedure: Pacemaker Implant;  Surgeon: Deboraha Sprang, MD;  Location: [place] CV LAB;  Service: Cardiovascular;  Laterality: N/A;  . SHOULDER OPEN ROTATOR CUFF REPAIR Left   . SKIN CANCER EXCISION Right    "cheek; real deep"  . TEE WITHOUT CARDIOVERSION N/A 03/14/2015   Procedure: TRANSESOPHAGEAL ECHOCARDIOGRAM (TEE);  Surgeon: [name], MD;  Location: [place];  Service: Open Heart Surgery;  Laterality: N/A;  . TEE WITHOUT CARDIOVERSION N/A 12/19/2015   Procedure: TRANSESOPHAGEAL ECHOCARDIOGRAM (TEE);  Surgeon: [name], MD;  Location: [place];  Service: Cardiovascular;  Laterality: N/A;  . TEE WITHOUT CARDIOVERSION N/A 03/24/2016   Procedure:  TRANSESOPHAGEAL ECHOCARDIOGRAM (TEE);  Surgeon: [name], MD;  Location: [facility];  Service: Cardiovascular;  Laterality: N/A;  . TONSILLECTOMY    . TOTAL KNEE ARTHROPLASTY Right        Family History  Problem Relation Age of Onset  . Diabetes Brother   . Esophageal cancer Father   . Colon cancer Neg Hx   . Stomach cancer Neg Hx     Social History   Tobacco Use  . Smoking status: Former Smoker    Packs/day: 1.00    Years: 52.00    Pack years: 52.00    Types: Cigarettes  . Smokeless tobacco: Never Used  . Tobacco comment: 07/02/2016 "quit before 2000"  Substance Use Topics  . Alcohol use: No    Comment: 07/02/2016 "quit before 2000"  . Drug use: No    Home Medications Prior to Admission medications   Medication Sig Start Date End Date Taking? Authorizing Provider  apixaban (ELIQUIS) 5 MG TABS tablet Take 1 tablet (5 mg total) by mouth 2 (two) times daily. 11/26/18   Deboraha Sprang, MD  atorvastatin (LIPITOR) 10 MG tablet TAKE 1 TABLET DAILY 11/15/18   Jenny Reichmann,  Hunt Oris, MD  Benzethonium Cl & Zinc Oxide 0.13 & 10 % KIT Apply topically.    [provider]  doxylamine, Sleep, (UNISOM) 25 MG tablet Take 25 mg by mouth at bedtime.    [provider]  fluticasone (FLONASE) 50 MCG/ACT nasal spray Place 2 sprays into both nostrils once for 1 dose. 04/08/19 04/08/19  Biagio Borg, MD  HYDROcodone-acetaminophen (NORCO) 10-325 MG tablet Take 1 tablet by mouth daily as needed. 04/08/19   Biagio Borg, MD  Multiple Vitamin (MULTIVITAMIN) tablet Take 1 tablet by mouth daily.      [provider]  mupirocin cream (BACTROBAN) 2 % Apply 1 application topically 2 (two) times daily. 10/19/18   Biagio Borg, MD  mupirocin ointment (BACTROBAN) 2 % Place 1 application into the nose 2 (two) times daily. 10/20/18   Biagio Borg, MD  omega-3 acid ethyl esters (LOVAZA) 1 g capsule Take by mouth.    [provider]  omeprazole (PRILOSEC) 40 MG capsule Take 1  capsule (40 mg total) by mouth daily. 05/24/19   Irene Shipper, MD  Psyllium (METAMUCIL PO) 1 TABLESPOON IN A GLASS OF WATER ONCE DAILY    [provider]  tamsulosin (FLOMAX) 0.4 MG CAPS capsule TAKE 1 CAPSULE DAILY 10/22/18   Biagio Borg, MD  triamcinolone (NASACORT) 55 MCG/ACT AERO nasal inhaler Place 2 sprays into the nose daily. 07/02/18   Biagio Borg, MD  ZINC OXIDE EX Apply 1 application topically as needed (itching).    [provider]    Allergies    Amiodarone hcl and Ace inhibitors  Review of Systems   Review of Systems  HENT: Positive for nosebleeds.   All other systems reviewed and are negative.   Physical Exam Updated Vital Signs BP (!) 159/82   Pulse 70   Temp 97.7 F (36.5 C) (Oral)   Resp 18   SpO2 100%   Physical Exam Vitals and nursing note reviewed.  Constitutional:      General: He is not in acute distress.    Appearance: Normal appearance. He is not ill-appearing.  HENT:     Head: Normocephalic and atraumatic.     Nose: Nose normal.     Comments: There is blood noted in both nostrils. There is a gauze 4 x 4 in each nostril and bleeding seems to have resolved. Pulmonary:     Effort: Pulmonary effort is normal.  Skin:    General: Skin is warm and dry.  Neurological:     Mental Status: He is alert and oriented to person, place, and time.     ED Results / Procedures / Treatments   Labs (all labs ordered are listed, but only abnormal results are displayed) Labs Reviewed - No data to display  EKG None  Radiology No results found.  Procedures Procedures (including critical care time)  Medications Ordered in ED Medications  oxymetazoline (AFRIN) 0.05 % nasal spray 2 spray (has no administration in time range)    ED Course  I have reviewed the triage vital signs and the nursing notes.  Pertinent labs & imaging results that were available during my care of the patient were reviewed by me and considered in my medical  decision making (see chart for details).    MDM Rules/Calculators/A&P  Patient brought here by EMS for evaluation of nosebleed.  This began in the absence of any injury or trauma.  EMS has successfully stopped the bleeding with gauze and  pressure.  He also received Neo-Synephrine in route.  He was given additional Afrin here in the ER and has had no additional bleeding.  Patient given return instructions.  I feel as though he is appropriate for discharge.  Final Clinical Impression(s) / ED Diagnoses Final diagnoses:  None    Rx / DC Orders ED Discharge Orders    None       Veryl Speak, MD 05/27/19 1458

## 2019-05-27 NOTE — ED Notes (Signed)
ENT Cart at bedside. 

## 2019-05-27 NOTE — ED Triage Notes (Signed)
Pt arrives [facility] from home. Per EMS: pt reports a nose bleed that began this am. EMS administered 2 sprays of Afrin in Left nare. Pt is on Eliquis.

## 2019-05-27 NOTE — Telephone Encounter (Signed)
    Spouse calling to report patient has a severe nose bleed. EMS is currently at the home.

## 2019-05-27 NOTE — Discharge Instructions (Addendum)
You are seen in the ER for bleeding.  Do not take Eliquis until Sunday.  See the ENT doctor in their clinic on Monday for packing removal.  If they are unable to see you, then come to the ER.

## 2019-05-27 NOTE — ED Provider Notes (Signed)
  Physical Exam  BP (!) 143/82   Pulse 70   Temp 97.7 F (36.5 C) (Oral)   Resp 18   SpO2 98%   Physical Exam  ED Course/Procedures     Cauterization  Date/Time: 05/27/2019 6:00 PM Performed by: Varney Biles, MD Authorized by: Varney Biles, MD  Consent: Verbal consent obtained. Risks and benefits: risks, benefits and alternatives were discussed Consent given by: patient Patient understanding: patient states understanding of the procedure being performed Patient identity confirmed: arm band Time out: Immediately prior to procedure a "time out" was called to verify the correct patient, procedure, equipment, support staff and site/side marked as required. Comments: Despite the chemical cauterization, patient started having blood dripping again when he stood up.  Marland KitchenEpistaxis Management  Date/Time: 05/27/2019 7:14 PM Performed by: Varney Biles, MD Authorized by: Varney Biles, MD   Consent:    Consent obtained:  Verbal   Consent given by:  Patient   Risks discussed:  Bleeding and infection   Alternatives discussed:  No treatment Universal protocol:    Procedure explained and questions answered to patient or proxy's satisfaction: yes     Patient identity confirmed:  Arm band Anesthesia (see MAR for exact dosages):    Anesthesia method:  None Procedure details:    Treatment site:  L anterior   Treatment method:  Nasal balloon and nasal tampon   Treatment complexity:  Extensive   Treatment episode: initial   Post-procedure details:    Assessment:  Bleeding stopped   Patient tolerance of procedure:  Tolerated well, no immediate complications    MDM   Patient had come in with chief complaint of bleeding.  He is on Eliquis.  Patient was discharged, but he started oozing out blood. I took on patient's care. Initially we were able to control patient's bleeding with pressure. However patient started oozing of blood again, at which time we applied TXA and Afrin.   Patient's bleeding again was controlled but on reassessment he was having small amount of bleeding again.  Chemical cauterization was attempted at that time at the request of the patient.  Bleeding again was controlled when he got up he started having blood ooze out.  Finally we decided to proceed with rapid Rhino.  Bleeding has stopped.  Patient is stable for discharge.  Keflex will be given.       Varney Biles, MD 05/27/19 [phone]

## 2019-05-27 NOTE — ED Notes (Signed)
Upon attempting to dc pt. Pts nose began to bleed again. Delo MD no longer here. Dr. Lenoria Chime made aware

## 2019-05-29 NOTE — Progress Notes (Deleted)
Cardiology Office Note   Date:  05/29/2019   ID:  Jonathan Orozco, DOB [birth year], MRN [id number]  PCP:  Biagio Borg, MD    No chief complaint on file.  CAD  Wt Readings from Last 3 Encounters:  05/24/19 172 lb 12.8 oz (78.4 kg)  04/29/19 173 lb 12.8 oz (78.8 kg)  12/07/18 171 lb (77.6 kg)       History of Present Illness: Jonathan Orozco is a 86 y.o. male   with history of CAD (03/14/15 CABG  LIMA > LAD, SVG > 2nd Diag and OM 1/2 , also had LAA clipping; RCA with only mild disease ), HTN, HFpEF, symptomatic bradycardia w/PPM, uncontrolled rapid AFib, Atypical AFlutter now s/p AVN ablation, RVOT VT intolerant of amiodarone (patient states with sun sensitivity and visual changes), chronic LBP  He saw neurology, Dr. Tomi Likens 12/08/16 2/2 gait instability, exam not felt to suggest intracranial  issue, and referred to PT.  Referred here for f/u of CAD.    Past Medical History:  Diagnosis Date  . ABUSE, ALCOHOL, IN REMISSION 04/08/2007  . ALLERGIC RHINITIS 09/29/2006  . Arthritis    "hands" (07/02/2016)  . Atrial fibrillation ([place])   . Atrial fibrillation with RVR ([place]) 07/02/2016  . Atrial flutter ([place]) 03/28/2010   a. s/p prior rfca;  b. recurrent paroxysmal flutter 04/2012;  c. pradaxa initiated 04/2012.  [name] BENIGN PROSTATIC HYPERTROPHY 09/29/2006  . BPPV (benign paroxysmal positional vertigo) 04/08/2007  . CAD (coronary artery disease)    a. LHC 2/17: EF 55-65%, LM 75, pLAD 75, oD1 75, oD2 65, D3 85, oLCx 99, oOM1 75, pRCA 25 >> CABG  . Carotid stenosis    a. Carotid US 2/17:  Bilateral ICA 1-39% ICA  . Chronic lower back pain   . COLONIC POLYPS, HX OF 06/23/2007  . 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 Diverticulosis   . ERECTILE DYSFUNCTION 09/29/2006  . GERD (gastroesophageal reflux disease)   . GLUCOSE INTOLERANCE 03/19/2010  . HYPERLIPIDEMIA 09/29/2006  . HYPERTENSION 09/29/2006  . Long term (current) use of anticoagulants 04/26/2010  . MELANOMA, MALIGNANT, SKIN NOS  09/29/2006   other skin cancers -no further melanoma  . OSTEOARTHROSIS NOS, OTHER [facility] SITE 09/29/2006  . Other specified forms of hearing loss 08/10/2009  . Pneumonia ~ 1969  . Presence of permanent cardiac pacemaker 06/02/2016  . VENTRICULAR TACHYCARDIA 09/29/2006    Past Surgical History:  Procedure Laterality Date  . AV NODE ABLATION  07/02/2016  . AV NODE ABLATION N/A 07/02/2016   Procedure: AV Node Ablation;  Surgeon: Deboraha Sprang, MD;  Location: [place] CV LAB;  Service: Cardiovascular;  Laterality: N/A;  . CARDIAC CATHETERIZATION N/A 03/12/2015   Procedure: Left Heart Cath and Coronary Angiography;  Surgeon: [name], MD;  Location: [place] CV LAB;  Service: Cardiovascular;  Laterality: N/A;  . CARDIOVERSION N/A 12/19/2015   Procedure: CARDIOVERSION;  Surgeon: [name], MD;  Location: [facility] ENDOSCOPY;  Service: Cardiovascular;  Laterality: N/A;  . CARDIOVERSION N/A 03/24/2016   Procedure: CARDIOVERSION;  Surgeon: [name], MD;  Location: [place];  Service: Cardiovascular;  Laterality: N/A;  . CATARACT EXTRACTION W/ INTRAOCULAR LENS  IMPLANT, BILATERAL Bilateral   . CLIPPING OF ATRIAL APPENDAGE N/A 03/14/2015   Procedure: CLIPPING OF ATRIAL APPENDAGE;  Surgeon: [name], MD;  Location: [place];  Service: Open Heart Surgery;  Laterality: N/A;  . COLONOSCOPY WITH PROPOFOL N/A 12/08/2014   Procedure: COLONOSCOPY WITH PROPOFOL;  Surgeon: [name], MD;  Location: [facility] ENDOSCOPY;  Service: Endoscopy;  Laterality: N/A;  . CORONARY ARTERY BYPASS GRAFT N/A 03/14/2015   Procedure: CORONARY ARTERY BYPASS GRAFTING (CABG) x 4 (LIMA to LAD, SVG to DIAGONAL 2, SVG SEQUENTIALLY to OM1 and OM2);  Surgeon: [name], MD;  Location: [place];  Service: Open Heart Surgery;  Laterality: N/A;  . INCISION AND DRAINAGE N/A 07/03/2016   Procedure: INCISION AND DRAINAGE of CHEST ABSCESS;  Surgeon: [name], MD;  Location: [place];  Service: Thoracic;   Laterality: N/A;  . INSERT / REPLACE / REMOVE PACEMAKER  06/02/2016  . JOINT REPLACEMENT    . KNEE ARTHROSCOPY Left   . MELANOMA EXCISION     "upper back"  . PACEMAKER IMPLANT N/A 06/02/2016   Procedure: Pacemaker Implant;  Surgeon: Deboraha Sprang, MD;  Location: [place] CV LAB;  Service: Cardiovascular;  Laterality: N/A;  . SHOULDER OPEN ROTATOR CUFF REPAIR Left   . SKIN CANCER EXCISION Right    "cheek; real deep"  . TEE WITHOUT CARDIOVERSION N/A 03/14/2015   Procedure: TRANSESOPHAGEAL ECHOCARDIOGRAM (TEE);  Surgeon: [name], MD;  Location: [place];  Service: Open Heart Surgery;  Laterality: N/A;  . TEE WITHOUT CARDIOVERSION N/A 12/19/2015   Procedure: TRANSESOPHAGEAL ECHOCARDIOGRAM (TEE);  Surgeon: [name], MD;  Location: [place];  Service: Cardiovascular;  Laterality: N/A;  . TEE WITHOUT CARDIOVERSION N/A 03/24/2016   Procedure: TRANSESOPHAGEAL ECHOCARDIOGRAM (TEE);  Surgeon: [name], MD;  Location: [place];  Service: Cardiovascular;  Laterality: N/A;  . TONSILLECTOMY    . TOTAL KNEE ARTHROPLASTY Right      Current Outpatient Medications  Medication Sig Dispense Refill  . apixaban (ELIQUIS) 5 MG TABS tablet Take 1 tablet (5 mg total) by mouth 2 (two) times daily. 180 tablet 1  . atorvastatin (LIPITOR) 10 MG tablet TAKE 1 TABLET DAILY (Patient taking differently: Take 10 mg by mouth daily. ) 90 tablet 3  . cephALEXin (KEFLEX) 500 MG capsule Take 1 capsule (500 mg total) by mouth 3 (three) times daily. 15 capsule 0  . doxylamine, Sleep, (UNISOM) 25 MG tablet Take 25 mg by mouth at bedtime as needed for sleep.     . fluticasone (FLONASE) 50 MCG/ACT nasal spray Place 2 sprays into both nostrils once for 1 dose. (Patient taking differently: Place 2 sprays into both nostrils daily as needed for rhinitis. ) 16 g 0  . HYDROcodone-acetaminophen (NORCO) 10-325 MG tablet Take 1 tablet by mouth daily as needed. (Patient taking differently: Take 1 tablet by  mouth daily as needed for moderate pain. ) 30 tablet 0  . Multiple Vitamin (MULTIVITAMIN) tablet Take 1 tablet by mouth daily.      . mupirocin cream (BACTROBAN) 2 % Apply 1 application topically 2 (two) times daily. (Patient not taking: Reported on 05/27/2019) 15 g 0  . mupirocin ointment (BACTROBAN) 2 % Place 1 application into the nose 2 (two) times daily. (Patient not taking: Reported on 05/27/2019) 22 g 2  . omega-3 acid ethyl esters (LOVAZA) 1 g capsule Take by mouth.    [name] omeprazole (PRILOSEC) 40 MG capsule Take 1 capsule (40 mg total) by mouth daily. 30 capsule 11  . Psyllium (METAMUCIL PO) Take 15 mLs by mouth 3 (three) times daily as needed (constipation).     . tamsulosin (FLOMAX) 0.4 MG CAPS capsule TAKE 1 CAPSULE DAILY (Patient taking differently: Take 0.4 mg by mouth daily. ) 90 capsule 3  . triamcinolone (  NASACORT) 55 MCG/ACT AERO nasal inhaler Place 2 sprays into the nose daily. 1 Inhaler 12  . ZINC OXIDE EX Apply 1 application topically as needed (itching).     No current facility-administered medications for this visit.    Allergies:   Amiodarone hcl and Ace inhibitors    Social History:  The patient  reports that he has quit smoking. His smoking use included cigarettes. He has a 52.00 pack-year smoking history. He has never used smokeless tobacco. He reports that he does not drink alcohol or use drugs.   Family History:  The patient's ***family history includes Diabetes in his brother; Esophageal cancer in his father.    ROS:  Please see the history of present illness.   Otherwise, review of systems are positive for ***.   All other systems are reviewed and negative.    PHYSICAL EXAM: VS:  There were no vitals taken for this visit. , BMI There is no height or weight on file to calculate BMI. GEN: Well nourished, well developed, in no acute distress  HEENT: normal  Neck: no JVD, carotid bruits, or masses Cardiac: ***RRR; no murmurs, rubs, or gallops,no edema    Respiratory:  clear to auscultation bilaterally, normal work of breathing GI: soft, nontender, nondistended, + BS MS: no deformity or atrophy  Skin: warm and dry, no rash Neuro:  Strength and sensation are intact Psych: euthymic mood, full affect   EKG:   The ekg ordered today demonstrates ***   Recent Labs: 04/29/2019: ALT 20; BUN 14; Creatinine, Ser 0.86; Hemoglobin 14.0; Platelets 245.0; Potassium 5.1; Sodium 134; TSH 0.50   Lipid Panel    Component Value Date/Time   CHOL 104 04/29/2019 1104   TRIG 65.0 04/29/2019 1104   HDL 43.90 04/29/2019 1104   CHOLHDL 2 04/29/2019 1104   VLDL 13.0 04/29/2019 1104   LDLCALC 47 04/29/2019 1104   LDLDIRECT 159.2 09/29/2006 1112     Other studies Reviewed: Additional studies/ records that were reviewed today with results demonstrating: ***.   ASSESSMENT AND PLAN:  1. CAD: Status post CABG.  Continue aggressive secondary prevention. 2. Hyperlipidemia: 3. Hypertension:   Current medicines are reviewed at length with the patient today.  The patient concerns regarding his medicines were addressed.  The following changes have been made:  No change***  Labs/ tests ordered today include: *** No orders of the defined types were placed in this encounter.   Recommend 150 minutes/week of aerobic exercise Low fat, low carb, high fiber diet recommended  Disposition:   FU in ***   Signed, Larae Grooms, MD  05/29/2019 9:01 PM    [facility] [address], [place], [place]  [address] Phone: [phone]; Fax: [phone]

## 2019-05-30 ENCOUNTER — Other Ambulatory Visit: Payer: Self-pay

## 2019-05-30 ENCOUNTER — Encounter (HOSPITAL_COMMUNITY): Payer: Self-pay

## 2019-05-30 ENCOUNTER — Encounter (HOSPITAL_BASED_OUTPATIENT_CLINIC_OR_DEPARTMENT_OTHER): Payer: Self-pay | Admitting: *Deleted

## 2019-05-30 ENCOUNTER — Emergency Department (HOSPITAL_BASED_OUTPATIENT_CLINIC_OR_DEPARTMENT_OTHER)
Admission: EM | Admit: 2019-05-30 | Discharge: 2019-05-30 | Disposition: A | Discharge status: Home / Self Care | Payer: Medicare Other | Source: Home / Self Care

## 2019-05-30 ENCOUNTER — Ambulatory Visit: Payer: Medicare Other | Admitting: Interventional Cardiology

## 2019-05-30 ENCOUNTER — Emergency Department (HOSPITAL_COMMUNITY)
Admission: EM | Admit: 2019-05-30 | Discharge: 2019-05-30 | Disposition: A | Discharge status: Home / Self Care | Payer: Medicare Other | Attending: Emergency Medicine | Admitting: Emergency Medicine

## 2019-05-30 DIAGNOSIS — Z4801 Encounter for change or removal of surgical wound dressing: Secondary | ICD-10-CM | POA: Insufficient documentation

## 2019-05-30 DIAGNOSIS — Z5321 Procedure and treatment not carried out due to patient leaving prior to being seen by health care provider: Secondary | ICD-10-CM | POA: Insufficient documentation

## 2019-05-30 DIAGNOSIS — R040 Epistaxis: Secondary | ICD-10-CM | POA: Insufficient documentation

## 2019-05-30 NOTE — ED Triage Notes (Signed)
Patient states he is here to have left nare packing removed.

## 2019-05-30 NOTE — ED Triage Notes (Signed)
Pt here for nose packing removal.

## 2019-05-31 DIAGNOSIS — Z7901 Long term (current) use of anticoagulants: Secondary | ICD-10-CM | POA: Insufficient documentation

## 2019-05-31 DIAGNOSIS — R040 Epistaxis: Secondary | ICD-10-CM | POA: Insufficient documentation

## 2019-06-02 ENCOUNTER — Telehealth: Payer: Self-pay

## 2019-06-02 NOTE — Telephone Encounter (Signed)
1.Medication Requested:HYDROcodone-acetaminophen (NORCO) 10-325 MG tablet  2. Pharmacy (Name, Street, [place]):[facility], [place]  3. On Med List: Yes   4. Last Visit with PCP: 3.26.2021   5. Next visit date with PCP: 6.10.21    Agent: Please be advised that RX refills may take up to 3 business days. We ask that you follow-up with your pharmacy.

## 2019-06-03 ENCOUNTER — Ambulatory Visit (INDEPENDENT_AMBULATORY_CARE_PROVIDER_SITE_OTHER): Payer: Medicare Other | Admitting: *Deleted

## 2019-06-03 ENCOUNTER — Telehealth: Payer: Self-pay

## 2019-06-03 DIAGNOSIS — I4891 Unspecified atrial fibrillation: Secondary | ICD-10-CM

## 2019-06-03 LAB — CUP PACEART REMOTE DEVICE CHECK
Battery Remaining Longevity: 107 mo
Battery Voltage: 3.01 V
Brady Statistic RV Percent Paced: 96.51 %
Date Time Interrogation Session: 20210430133349
Implantable Lead Implant Date: 20180430
Implantable Lead Location: 753860
Implantable Lead Model: 5076
Implantable Pulse Generator Implant Date: 20180430
Lead Channel Impedance Value: 323 Ohm
Lead Channel Impedance Value: 494 Ohm
Lead Channel Pacing Threshold Amplitude: 0.75 V
Lead Channel Pacing Threshold Pulse Width: 0.4 ms
Lead Channel Sensing Intrinsic Amplitude: 1.875 mV
Lead Channel Sensing Intrinsic Amplitude: 1.875 mV
Lead Channel Setting Pacing Amplitude: 2.5 V
Lead Channel Setting Pacing Pulse Width: 0.4 ms
Lead Channel Setting Sensing Sensitivity: 1.2 mV

## 2019-06-03 MED ORDER — HYDROCODONE-ACETAMINOPHEN 10-325 MG PO TABS: 1.0000 | ORAL_TABLET | Freq: Every day | ORAL | 0 refills | Status: DC | PRN

## 2019-06-03 NOTE — Telephone Encounter (Signed)
The pt wife been trying to send a transmission with the mobile app but is not being successful. The last time we tried it states it went thru. I told her if I did not see the transmission by 4 pm I will give her a call back.

## 2019-06-03 NOTE — Progress Notes (Signed)
PPM Remote  

## 2019-06-03 NOTE — Telephone Encounter (Signed)
Transmission received.

## 2019-06-03 NOTE — Telephone Encounter (Signed)
Done erx 

## 2019-06-09 ENCOUNTER — Other Ambulatory Visit: Payer: Self-pay | Admitting: *Deleted

## 2019-06-09 MED ORDER — APIXABAN 5 MG PO TABS: 5.0000 mg | ORAL_TABLET | Freq: Two times a day (BID) | ORAL | 1 refills | Status: DC

## 2019-06-09 NOTE — Telephone Encounter (Signed)
Prescription refill request for Eliquis received.  Last office visit: Jonathan Orozco, 12/07/2018 Scr: 0.86, 04/29/2019 Age: 86 y.o. Weight: 78.4 kg   Prescription refill sent.

## 2019-06-18 ENCOUNTER — Telehealth: Payer: Self-pay | Admitting: Physician Assistant

## 2019-06-18 NOTE — Telephone Encounter (Signed)
Paged by the patient's wife regarding nosebleed again.  Patient's last episode of nosebleed was near the end of April for which he went to the emergency room.  He had nasal packing and was seen by ENT specialist afterward.  This morning, he had recurrent nosebleed that lasted roughly 3 hours.  It is stopped at this point.  I asked him to hold blood thinner for tonight as well and attempt to restart tomorrow.  If symptoms keep recurring, he may have to see his ENT doctor again to consider ablation.

## 2019-06-20 ENCOUNTER — Telehealth: Payer: Self-pay | Admitting: Interventional Cardiology

## 2019-06-20 ENCOUNTER — Emergency Department (HOSPITAL_COMMUNITY)
Admission: EM | Admit: 2019-06-20 | Discharge: 2019-06-20 | Disposition: A | Discharge status: Home / Self Care | Payer: Medicare Other | Attending: Emergency Medicine | Admitting: Emergency Medicine

## 2019-06-20 ENCOUNTER — Encounter (HOSPITAL_COMMUNITY): Payer: Self-pay | Admitting: Emergency Medicine

## 2019-06-20 ENCOUNTER — Telehealth: Payer: Self-pay | Admitting: Internal Medicine

## 2019-06-20 ENCOUNTER — Other Ambulatory Visit: Payer: Self-pay

## 2019-06-20 DIAGNOSIS — R040 Epistaxis: Secondary | ICD-10-CM | POA: Insufficient documentation

## 2019-06-20 DIAGNOSIS — Z87891 Personal history of nicotine dependence: Secondary | ICD-10-CM | POA: Diagnosis not present

## 2019-06-20 DIAGNOSIS — I2510 Atherosclerotic heart disease of native coronary artery without angina pectoris: Secondary | ICD-10-CM | POA: Diagnosis not present

## 2019-06-20 DIAGNOSIS — Z79899 Other long term (current) drug therapy: Secondary | ICD-10-CM | POA: Diagnosis not present

## 2019-06-20 DIAGNOSIS — Z7901 Long term (current) use of anticoagulants: Secondary | ICD-10-CM | POA: Insufficient documentation

## 2019-06-20 DIAGNOSIS — Z85820 Personal history of malignant melanoma of skin: Secondary | ICD-10-CM | POA: Insufficient documentation

## 2019-06-20 LAB — CBC
HCT: 40.7 % (ref 39.0–52.0)
Hemoglobin: 12.8 g/dL — ABNORMAL LOW (ref 13.0–17.0)
MCH: 31.8 pg (ref 26.0–34.0)
MCHC: 31.4 g/dL (ref 30.0–36.0)
MCV: 101.2 fL — ABNORMAL HIGH (ref 80.0–100.0)
Platelets: 225 10*3/uL (ref 150–400)
RBC: 4.02 MIL/uL — ABNORMAL LOW (ref 4.22–5.81)
RDW: 13.3 % (ref 11.5–15.5)
WBC: 10.7 10*3/uL — ABNORMAL HIGH (ref 4.0–10.5)
nRBC: 0 % (ref 0.0–0.2)

## 2019-06-20 MED ORDER — OXYMETAZOLINE HCL 0.05 % NA SOLN
1.0000 | Freq: Once | NASAL | Status: AC
  Administered 2019-06-20: 1 via NASAL
  Filled 2019-06-20: qty 30

## 2019-06-20 NOTE — ED Triage Notes (Signed)
Pt in w/nose bleed, takes Eliquis (has not taken x 2 days d/t recent nose bleed Saturday - told to hold thinners per ENT). Hx of severe nose bleed 4/23, had packing removed 4/26, and he had remained free of bleeds until Saturday. Bleeding started today at 0445. Comes with gauze and nose clamp applied, bleeding continues

## 2019-06-20 NOTE — ED Provider Notes (Signed)
[facility] EMERGENCY DEPARTMENT Provider Note   CSN: [id number] Arrival date & time: 06/20/19  [id number]     History Chief Complaint  Patient presents with  . Epistaxis    Jonathan Orozco is a 86 y.o. male.  The history is provided by the patient.  Epistaxis Location:  L nare Severity:  Moderate Timing:  Constant Progression:  Unchanged Chronicity:  Recurrent Context: anticoagulants (stopped that last few days due to recent nosebleed. Bleeding starting again overnight, has packing in left nare that was recently removed.)   Relieved by:  Nothing Worsened by:  Nothing Associated symptoms: no blood in oropharynx, no congestion, no cough, no dizziness, no facial pain, no fever, no headaches, no sinus pain, no sneezing, no sore throat and no syncope   Risk factors: frequent nosebleeds        Past Medical History:  Diagnosis Date  . ABUSE, ALCOHOL, IN REMISSION 04/08/2007  . ALLERGIC RHINITIS 09/29/2006  . Arthritis    "hands" (07/02/2016)  . Atrial fibrillation ([place])   . Atrial fibrillation with RVR ([place]) 07/02/2016  . Atrial flutter ([place]) 03/28/2010   a. s/p prior rfca;  b. recurrent paroxysmal flutter 04/2012;  c. pradaxa initiated 04/2012.  [name] BENIGN PROSTATIC HYPERTROPHY 09/29/2006  . BPPV (benign paroxysmal positional vertigo) 04/08/2007  . CAD (coronary artery disease)    a. LHC 2/17: EF 55-65%, LM 75, pLAD 75, oD1 75, oD2 65, D3 85, oLCx 99, oOM1 75, pRCA 25 >> CABG  . Carotid stenosis    a. Carotid US 2/17:  Bilateral ICA 1-39% ICA  . Chronic lower back pain   . COLONIC POLYPS, HX OF 06/23/2007  . 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 Diverticulosis   . ERECTILE DYSFUNCTION 09/29/2006  . GERD (gastroesophageal reflux disease)   . GLUCOSE INTOLERANCE 03/19/2010  . HYPERLIPIDEMIA 09/29/2006  . HYPERTENSION 09/29/2006  . Long term (current) use of anticoagulants 04/26/2010  . MELANOMA, MALIGNANT, SKIN NOS 09/29/2006   other skin cancers -no further melanoma   . OSTEOARTHROSIS NOS, OTHER [facility] SITE 09/29/2006  . Other specified forms of hearing loss 08/10/2009  . Pneumonia ~ 1969  . Presence of permanent cardiac pacemaker 06/02/2016  . VENTRICULAR TACHYCARDIA 09/29/2006    Patient Active Problem List   Diagnosis Date Noted  . Acute gouty arthritis 04/29/2019  . Left arm swelling 01/05/2018  . IBS (irritable bowel syndrome) 06/30/2017  . Encounter for well adult exam with abnormal findings 06/19/2016  . Increased prostate specific antigen (PSA) velocity 06/19/2016  . Gait disorder 01/27/2016  . Chronic anticoagulation 12/18/2015  . Hyperglycemia 06/17/2015  . CAD (coronary artery disease), native coronary artery 03/12/2015  . Atrial fibrillation with rapid ventricular response ([place])   . Solitary pulmonary nodule 05/24/2013  . Anxiety state 05/21/2012  . Chronic low back pain 05/21/2012  . Long term (current) use of anticoagulants 04/24/2012  . Hearing loss of both ears 11/11/2010  . Fatigue 11/16/2009  . VERTIGO 04/08/2007  . MELANOMA, MALIGNANT, SKIN NOS 09/29/2006  . Hyperlipidemia 09/29/2006  . ERECTILE DYSFUNCTION 09/29/2006  . VENTRICULAR TACHYCARDIA 09/29/2006  . BENIGN PROSTATIC HYPERTROPHY 09/29/2006  . OSTEOARTHROSIS NOS, OTHER [facility] SITE 09/29/2006    Past Surgical History:  Procedure Laterality Date  . AV NODE ABLATION  07/02/2016  . AV NODE ABLATION N/A 07/02/2016   Procedure: AV Node Ablation;  Surgeon: Deboraha Sprang, MD;  Location: [place] CV LAB;  Service: Cardiovascular;  Laterality: N/A;  . CARDIAC CATHETERIZATION N/A 03/12/2015  Procedure: Left Heart Cath and Coronary Angiography;  Surgeon: [name], MD;  Location: [place] CV LAB;  Service: Cardiovascular;  Laterality: N/A;  . CARDIOVERSION N/A 12/19/2015   Procedure: CARDIOVERSION;  Surgeon: [name], MD;  Location: [facility] ENDOSCOPY;  Service: Cardiovascular;  Laterality: N/A;  . CARDIOVERSION N/A 03/24/2016   Procedure: CARDIOVERSION;  Surgeon:  [name], MD;  Location: [place];  Service: Cardiovascular;  Laterality: N/A;  . CATARACT EXTRACTION W/ INTRAOCULAR LENS  IMPLANT, BILATERAL Bilateral   . CLIPPING OF ATRIAL APPENDAGE N/A 03/14/2015   Procedure: CLIPPING OF ATRIAL APPENDAGE;  Surgeon: [name], MD;  Location: [place];  Service: Open Heart Surgery;  Laterality: N/A;  . COLONOSCOPY WITH PROPOFOL N/A 12/08/2014   Procedure: COLONOSCOPY WITH PROPOFOL;  Surgeon: [name], MD;  Location: [facility] ENDOSCOPY;  Service: Endoscopy;  Laterality: N/A;  . CORONARY ARTERY BYPASS GRAFT N/A 03/14/2015   Procedure: CORONARY ARTERY BYPASS GRAFTING (CABG) x 4 (LIMA to LAD, SVG to DIAGONAL 2, SVG SEQUENTIALLY to OM1 and OM2);  Surgeon: [name], MD;  Location: [place];  Service: Open Heart Surgery;  Laterality: N/A;  . INCISION AND DRAINAGE N/A 07/03/2016   Procedure: INCISION AND DRAINAGE of CHEST ABSCESS;  Surgeon: [name], MD;  Location: [place];  Service: Thoracic;  Laterality: N/A;  . INSERT / REPLACE / REMOVE PACEMAKER  06/02/2016  . JOINT REPLACEMENT    . KNEE ARTHROSCOPY Left   . MELANOMA EXCISION     "upper back"  . PACEMAKER IMPLANT N/A 06/02/2016   Procedure: Pacemaker Implant;  Surgeon: Deboraha Sprang, MD;  Location: [place] CV LAB;  Service: Cardiovascular;  Laterality: N/A;  . SHOULDER OPEN ROTATOR CUFF REPAIR Left   . SKIN CANCER EXCISION Right    "cheek; real deep"  . TEE WITHOUT CARDIOVERSION N/A 03/14/2015   Procedure: TRANSESOPHAGEAL ECHOCARDIOGRAM (TEE);  Surgeon: [name], MD;  Location: [place];  Service: Open Heart Surgery;  Laterality: N/A;  . TEE WITHOUT CARDIOVERSION N/A 12/19/2015   Procedure: TRANSESOPHAGEAL ECHOCARDIOGRAM (TEE);  Surgeon: [name], MD;  Location: [place];  Service: Cardiovascular;  Laterality: N/A;  . TEE WITHOUT CARDIOVERSION N/A 03/24/2016   Procedure: TRANSESOPHAGEAL ECHOCARDIOGRAM (TEE);  Surgeon: [name], MD;  Location: [place];  Service: Cardiovascular;  Laterality: N/A;  . TONSILLECTOMY    . TOTAL KNEE ARTHROPLASTY Right        Family History  Problem Relation Age of Onset  . Diabetes Brother   . Esophageal cancer Father   . Colon cancer Neg Hx   . Stomach cancer Neg Hx     Social History   Tobacco Use  . Smoking status: Former Smoker    Packs/day: 1.00    Years: 52.00    Pack years: 52.00    Types: Cigarettes  . Smokeless tobacco: Never Used  . Tobacco comment: 07/02/2016 "quit before 2000"  Substance Use Topics  . Alcohol use: No    Comment: 07/02/2016 "quit before 2000"  . Drug use: No    Home Medications Prior to Admission medications   Medication Sig Start Date End Date Taking? Authorizing Provider  apixaban (ELIQUIS) 5 MG TABS tablet Take 1 tablet (5 mg total) by mouth 2 (two) times daily. 06/09/19   Deboraha Sprang, MD  atorvastatin (LIPITOR) 10 MG tablet TAKE 1 TABLET DAILY Patient taking differently: Take 10 mg by mouth daily.  11/15/18   Biagio Borg, MD  cephALEXin (  KEFLEX) 500 MG capsule Take 1 capsule (500 mg total) by mouth 3 (three) times daily. 05/27/19   Varney Biles, MD  doxylamine, Sleep, (UNISOM) 25 MG tablet Take 25 mg by mouth at bedtime as needed for sleep.     [provider]  fluticasone (FLONASE) 50 MCG/ACT nasal spray Place 2 sprays into both nostrils once for 1 dose. Patient taking differently: Place 2 sprays into both nostrils daily as needed for rhinitis.  04/08/19 05/27/19  Biagio Borg, MD  HYDROcodone-acetaminophen [facility]) 10-325 MG tablet Take 1 tablet by mouth daily as needed. 06/03/19   Biagio Borg, MD  Multiple Vitamin (MULTIVITAMIN) tablet Take 1 tablet by mouth daily.      [provider]  mupirocin cream (BACTROBAN) 2 % Apply 1 application topically 2 (two) times daily. Patient not taking: Reported on 05/27/2019 10/19/18   Biagio Borg, MD  mupirocin ointment (BACTROBAN) 2 % Place 1 application into the nose 2 (two) times daily.  Patient not taking: Reported on 05/27/2019 10/20/18   Biagio Borg, MD  omega-3 acid ethyl esters (LOVAZA) 1 g capsule Take by mouth.    [provider]  omeprazole (PRILOSEC) 40 MG capsule Take 1 capsule (40 mg total) by mouth daily. 05/24/19   Irene Shipper, MD  Psyllium (METAMUCIL PO) Take 15 mLs by mouth 3 (three) times daily as needed (constipation).     [provider]  tamsulosin (FLOMAX) 0.4 MG CAPS capsule TAKE 1 CAPSULE DAILY Patient taking differently: Take 0.4 mg by mouth daily.  10/22/18   Biagio Borg, MD  triamcinolone (NASACORT) 55 MCG/ACT AERO nasal inhaler Place 2 sprays into the nose daily. 07/02/18   Biagio Borg, MD  ZINC OXIDE EX Apply 1 application topically as needed (itching).    [provider]    Allergies    Amiodarone hcl and Ace inhibitors  Review of Systems   Review of Systems  Constitutional: Negative for chills and fever.  HENT: Positive for nosebleeds. Negative for congestion, ear pain, sinus pain, sneezing and sore throat.   Eyes: Negative for pain and visual disturbance.  Respiratory: Negative for cough and shortness of breath.   Cardiovascular: Negative for chest pain, palpitations and syncope.  Gastrointestinal: Negative for abdominal pain and vomiting.  Genitourinary: Negative for dysuria and hematuria.  Musculoskeletal: Negative for arthralgias and back pain.  Skin: Negative for color change and rash.  Neurological: Negative for dizziness, seizures, syncope and headaches.  All other systems reviewed and are negative.   Physical Exam Updated Vital Signs  ED Triage Vitals  Enc Vitals Group     BP 06/20/19 0636 (!) 181/105     Pulse Rate 06/20/19 0636 73     Resp 06/20/19 0636 18     Temp 06/20/19 0637 (!) 96 F (35.6 C)     Temp Source 06/20/19 0637 Axillary     SpO2 06/20/19 0636 100 %     Weight 06/20/19 0637 172 lb 9.9 oz (78.3 kg)     Height --      Head Circumference --      Peak Flow --      Pain Score  --      Pain Loc --      Pain Edu? --      Excl. in [place]? --     Physical Exam Vitals and nursing note reviewed.  Constitutional:      General: He is not in acute distress.  Appearance: He is well-developed. He is not ill-appearing.  HENT:     Head: Normocephalic and atraumatic.     Nose:     Comments: Bleeding to the left nare, dried blood in the posterior oropharynx but no signs of obvious persistent drainage and blood in the posterior oropharynx, no obvious bleeding from the right nare    Mouth/Throat:     Mouth: Mucous membranes are moist.  Eyes:     Conjunctiva/sclera: Conjunctivae normal.     Pupils: Pupils are equal, round, and reactive to light.  Cardiovascular:     Rate and Rhythm: Normal rate and regular rhythm.     Pulses: Normal pulses.     Heart sounds: Normal heart sounds. No murmur.  Pulmonary:     Effort: Pulmonary effort is normal. No respiratory distress.     Breath sounds: Normal breath sounds.  Abdominal:     Palpations: Abdomen is soft.     Tenderness: There is no abdominal tenderness.  Musculoskeletal:     Cervical back: Normal range of motion and neck supple.  Skin:    General: Skin is warm and dry.  Neurological:     General: No focal deficit present.     Mental Status: He is alert.     ED Results / Procedures / Treatments   Labs (all labs ordered are listed, but only abnormal results are displayed) Labs Reviewed  CBC - Abnormal; Notable for the following components:      Result Value   WBC 10.7 (*)    RBC 4.02 (*)    Hemoglobin 12.8 (*)    MCV 101.2 (*)    All other components within normal limits    EKG None  Radiology No results found.  Procedures .Epistaxis Management  Date/Time: 06/20/2019 8:06 AM Performed by: Lennice Sites, DO Authorized by: Lennice Sites, DO   Consent:    Consent obtained:  Verbal   Consent given by:  Patient   Risks discussed:  Bleeding, infection, nasal injury and pain   Alternatives discussed:   No treatment Procedure details:    Treatment site:  L anterior   Treatment method:  Anterior pack   Treatment complexity:  Extensive   Treatment episode: recurring   Post-procedure details:    Assessment:  Bleeding stopped   Patient tolerance of procedure:  Tolerated well, no immediate complications   (including critical care time)  Medications Ordered in ED Medications  oxymetazoline (AFRIN) 0.05 % nasal spray 1 spray (1 spray Each Nare Given 06/20/19 0809)    ED Course  I have reviewed the triage vital signs and the nursing notes.  Pertinent labs & imaging results that were available during my care of the patient were reviewed by me and considered in my medical decision making (see chart for details).    MDM Rules/Calculators/A&P                      Jonathan Orozco is an 86 year old male with history of A. fib on Eliquis who presents to the ED with nosebleed.  Patient with intermittent nosebleeds recently.  Had packing in his left nare recently removed.  Has been off Eliquis for several days due to recent nosebleeds.  States that he had on and off bleeding yesterday.  However since about 4 AM patient has had continuous bleeding despite pressure.  Pressure and Afrin in the room did not help with bleeding.  It appeared that bleeding is coming from the left nare.  Packing was placed into the left nare.  Will observe to see if there is any ongoing bleeding.  No obvious bleeding in the posterior oropharynx.  No obvious bleeding from the right nare.  The left nare has been where prior bleeding has been.  Patient recently saw Dr. Constance Holster with ENT.  Patient was observed in the ED for about 2 hours after packing and had no evidence of ongoing bleeding.  He will follow up with Dr. Constance Holster.  Understands return precautions.  This chart was dictated using voice recognition software.  Despite best efforts to proofread,  errors can occur which can change the documentation meaning.    Final Clinical  Impression(s) / ED Diagnoses Final diagnoses:  Left-sided epistaxis    Rx / DC Orders ED Discharge Orders    None       Lennice Sites, DO 06/20/19 1515

## 2019-06-20 NOTE — ED Notes (Signed)
Patient verbalizes understanding of discharge instructions . Opportunity for questions and answers were provided . Armband removed by staff ,Pt discharged from ED. W/C  offered at D/C  and Declined W/C at D/C and was escorted to lobby by RN.  

## 2019-06-20 NOTE — Discharge Instructions (Signed)
follow up with ENT, Dr. Constance Holster, in the next 1-2 days, call and schedule appointment and let them know you had packing placed in left nose at 8 am this morning.

## 2019-06-20 NOTE — Telephone Encounter (Signed)
New Message  Patient is calling in to see if there is anything that he can take for pain. States that the packing in his nose hurts and needs to take pain medicine to help. Please give patient/patient's wife a call back to assist.

## 2019-06-20 NOTE — Telephone Encounter (Signed)
Patient's wife states she called over the weekend because husband had a nosebleed. She states he has recurrent nose bleeds in the past.  He was advised to stop his Eliquis on Friday.  He went to the ENT and they packed his nose to get the bleeding to stop, they are not going to remove the packing until this coming Friday.  She wants to know when he should restart taking his Eliquis again.  See phone note dated 06/18/19.

## 2019-06-20 NOTE — Telephone Encounter (Signed)
Called and spoke to patient's wife (DPR on file). She states that the patient has had trouble with nosebleeds for the past several days. She states that these episodes typically last for 3 hours at a time. They called and spoke to the on-call provider on Friday and was instructed to hold Eliquis that night and try to restart the next day. She states that he did not restart the eliquis because he had nosebleeds on Saturday, Sunday, and again today. The patient ended up going to the ER and they packed his left nose and instructed for the patient to follow up with ENT. The patient has an appointment with ENT this Friday and they will not remove the packing until then. They are asking if the patient needs to restart his Eliquis or if he should remain off until the packing can be removed. The patient has Afib/flutter with a CHA2DS2Vasc of 4. Discussed with Dr. Irish Lack and patient will remain off of Eliquis until after packing is removed on Friday. Wife made aware of recommendations and verbalizes understanding.

## 2019-06-20 NOTE — Telephone Encounter (Signed)
Called and made the patient aware that he may take tylenol. Instructed for patient to avoid ASA and NSAIDs.

## 2019-06-21 ENCOUNTER — Telehealth: Payer: Self-pay | Admitting: Pharmacist

## 2019-06-21 NOTE — Telephone Encounter (Signed)
Pt and his wife called clinic to report recurrent nose bleeds. He went to the ER on 4/23 and again on 5/17 for nose bleeds. He sees ENT this Friday. Advised pt that Eliquis is the blood thinner that carries the lowest risk of bleeding compared to other DOACs or warfarin. Hopefully ENT can cauterize a vessel in his nose to help prevent future nose bleeds. Discussed that permanently stopping anticoagulation would increase his risk of stroke. Pt will keep appt with ENT this Friday.

## 2019-07-01 ENCOUNTER — Encounter: Payer: Self-pay | Admitting: Interventional Cardiology

## 2019-07-01 ENCOUNTER — Ambulatory Visit (INDEPENDENT_AMBULATORY_CARE_PROVIDER_SITE_OTHER): Payer: Medicare Other | Admitting: Interventional Cardiology

## 2019-07-01 ENCOUNTER — Other Ambulatory Visit: Payer: Self-pay

## 2019-07-01 VITALS — BP 114/64 | HR 92 | Ht 70.0 in | Wt 171.0 lb

## 2019-07-01 DIAGNOSIS — I10 Essential (primary) hypertension: Secondary | ICD-10-CM | POA: Diagnosis not present

## 2019-07-01 DIAGNOSIS — I2510 Atherosclerotic heart disease of native coronary artery without angina pectoris: Secondary | ICD-10-CM | POA: Diagnosis not present

## 2019-07-01 DIAGNOSIS — I5032 Chronic diastolic (congestive) heart failure: Secondary | ICD-10-CM

## 2019-07-01 DIAGNOSIS — I480 Paroxysmal atrial fibrillation: Secondary | ICD-10-CM

## 2019-07-01 NOTE — Progress Notes (Signed)
Cardiology Office Note   Date:  07/01/2019   ID:  Jonathan Orozco, DOB 1928-04-05, MRN [id number]  PCP:  Biagio Borg, MD    No chief complaint on file.  CAD  Wt Readings from Last 3 Encounters:  07/01/19 171 lb (77.6 kg)  06/20/19 172 lb 9.9 oz (78.3 kg)  05/24/19 172 lb 12.8 oz (78.4 kg)       History of Present Illness: Jonathan Orozco is a 86 y.o. male  with history of CAD (03/14/15 CABG  LIMA > LAD, SVG > 2nd Diag and OM 1/2 , also had LAA clipping ), HTN, HFpEF, symptomatic bradycardia w/PPM, uncontrolled rapid AFib, Atypical AFlutter now s/p AVN ablation, RVOT VT intolerant of amiodarone (patient states with sun sensitivity and visual changes), chronic LBP  He saw neurology, Dr. Tomi Likens 12/08/16 2/2 gait instability, exam not felt to suggest intracranial  issue, and referred to PT.    He is sent here for further eval due to severe nosebleeds.  He went to the ER and was treated with packing and sliver nitrates.  Eliquis was held briefly, but he had more nosebleeds when he restarted.  He went back to the ER.  Since 06/16/19, he has stayed of of Eliquis.  He saw ENT and had something cauterized in his nose on 5/21 with silver nitrate sticks.   Denies : Chest pain. Dizziness. Leg edema. Nitroglycerin use. Orthopnea. Palpitations. Paroxysmal nocturnal dyspnea. Shortness of breath. Syncope.   He walks some in his driveway, about every other day.        Past Medical History:  Diagnosis Date  . ABUSE, ALCOHOL, IN REMISSION 04/08/2007  . ALLERGIC RHINITIS 09/29/2006  . Arthritis    "hands" (07/02/2016)  . Atrial fibrillation ([place])   . Atrial fibrillation with RVR ([place]) 07/02/2016  . Atrial flutter ([place]) 03/28/2010   a. s/p prior rfca;  b. recurrent paroxysmal flutter 04/2012;  c. pradaxa initiated 04/2012.  [name] BENIGN PROSTATIC HYPERTROPHY 09/29/2006  . BPPV (benign paroxysmal positional vertigo) 04/08/2007  . CAD (coronary artery disease)    a. LHC 2/17: EF 55-65%, LM 75, pLAD 75, oD1  75, oD2 65, D3 85, oLCx 99, oOM1 75, pRCA 25 >> CABG  . Carotid stenosis    a. Carotid US 2/17:  Bilateral ICA 1-39% ICA  . Chronic lower back pain   . COLONIC POLYPS, HX OF 06/23/2007  . 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 Diverticulosis   . ERECTILE DYSFUNCTION 09/29/2006  . GERD (gastroesophageal reflux disease)   . GLUCOSE INTOLERANCE 03/19/2010  . HYPERLIPIDEMIA 09/29/2006  . HYPERTENSION 09/29/2006  . Long term (current) use of anticoagulants 04/26/2010  . MELANOMA, MALIGNANT, SKIN NOS 09/29/2006   other skin cancers -no further melanoma  . OSTEOARTHROSIS NOS, OTHER [facility] SITE 09/29/2006  . Other specified forms of hearing loss 08/10/2009  . Pneumonia ~ 1969  . Presence of permanent cardiac pacemaker 06/02/2016  . VENTRICULAR TACHYCARDIA 09/29/2006    Past Surgical History:  Procedure Laterality Date  . AV NODE ABLATION  07/02/2016  . AV NODE ABLATION N/A 07/02/2016   Procedure: AV Node Ablation;  Surgeon: Deboraha Sprang, MD;  Location: [place] [facility];  Service: Cardiovascular;  Laterality: N/A;  . CARDIAC CATHETERIZATION N/A 03/12/2015   Procedure: Left Heart Cath and Coronary Angiography;  Surgeon: [name], MD;  Location: [place] [facility];  Service: Cardiovascular;  Laterality: N/A;  . CARDIOVERSION N/A 12/19/2015   Procedure: CARDIOVERSION;  Surgeon:  [name], MD;  Location: [place];  Service: Cardiovascular;  Laterality: N/A;  . CARDIOVERSION N/A 03/24/2016   Procedure: CARDIOVERSION;  Surgeon: [name], MD;  Location: [place];  Service: Cardiovascular;  Laterality: N/A;  . CATARACT EXTRACTION W/ INTRAOCULAR LENS  IMPLANT, BILATERAL Bilateral   . CLIPPING OF ATRIAL APPENDAGE N/A 03/14/2015   Procedure: CLIPPING OF ATRIAL APPENDAGE;  Surgeon: [name], MD;  Location: [facility];  Service: Open Heart Surgery;  Laterality: N/A;  . COLONOSCOPY WITH PROPOFOL N/A 12/08/2014   Procedure: COLONOSCOPY WITH PROPOFOL;  Surgeon: [name], MD;   Location: [facility];  Service: Endoscopy;  Laterality: N/A;  . CORONARY ARTERY BYPASS GRAFT N/A 03/14/2015   Procedure: CORONARY ARTERY BYPASS GRAFTING (CABG) x 4 (LIMA to LAD, SVG to DIAGONAL 2, SVG SEQUENTIALLY to OM1 and OM2);  Surgeon: [name], MD;  Location: [place];  Service: Open Heart Surgery;  Laterality: N/A;  . INCISION AND DRAINAGE N/A 07/03/2016   Procedure: INCISION AND DRAINAGE of CHEST ABSCESS;  Surgeon: [name], MD;  Location: [facility];  Service: Thoracic;  Laterality: N/A;  . INSERT / REPLACE / REMOVE PACEMAKER  06/02/2016  . JOINT REPLACEMENT    . KNEE ARTHROSCOPY Left   . MELANOMA EXCISION     "upper back"  . PACEMAKER IMPLANT N/A 06/02/2016   Procedure: Pacemaker Implant;  Surgeon: Deboraha Sprang, MD;  Location: [place] [facility];  Service: Cardiovascular;  Laterality: N/A;  . SHOULDER OPEN ROTATOR CUFF REPAIR Left   . SKIN CANCER EXCISION Right    "cheek; real deep"  . TEE WITHOUT CARDIOVERSION N/A 03/14/2015   Procedure: TRANSESOPHAGEAL ECHOCARDIOGRAM (TEE);  Surgeon: [name], MD;  Location: [place];  Service: Open Heart Surgery;  Laterality: N/A;  . TEE WITHOUT CARDIOVERSION N/A 12/19/2015   Procedure: TRANSESOPHAGEAL ECHOCARDIOGRAM (TEE);  Surgeon: [name], MD;  Location: [place];  Service: Cardiovascular;  Laterality: N/A;  . TEE WITHOUT CARDIOVERSION N/A 03/24/2016   Procedure: TRANSESOPHAGEAL ECHOCARDIOGRAM (TEE);  Surgeon: [name], MD;  Location: [place];  Service: Cardiovascular;  Laterality: N/A;  . TONSILLECTOMY    . TOTAL KNEE ARTHROPLASTY Right      Current Outpatient Medications  Medication Sig Dispense Refill  . apixaban (ELIQUIS) 5 MG TABS tablet Take 1 tablet (5 mg total) by mouth 2 (two) times daily. 180 tablet 1  . atorvastatin (LIPITOR) 10 MG tablet TAKE 1 TABLET DAILY 90 tablet 3  . cephALEXin (KEFLEX) 500 MG capsule Take 1 capsule (500 mg total) by mouth 3 (three) times daily. 15 capsule 0   . doxylamine, Sleep, (UNISOM) 25 MG tablet Take 25 mg by mouth at bedtime as needed for sleep.     [name] HYDROcodone-acetaminophen (NORCO) 10-325 MG tablet Take 1 tablet by mouth daily as needed. 30 tablet 0  . Multiple Vitamin (MULTIVITAMIN) tablet Take 1 tablet by mouth daily.      . mupirocin cream (BACTROBAN) 2 % Apply 1 application topically 2 (two) times daily. 15 g 0  . mupirocin ointment (BACTROBAN) 2 % Place 1 application into the nose 2 (two) times daily. 22 g 2  . omega-3 acid ethyl esters (LOVAZA) 1 g capsule Take by mouth.    [name] omeprazole (PRILOSEC) 40 MG capsule Take 1 capsule (40 mg total) by mouth daily. 30 capsule 11  . Psyllium (METAMUCIL PO) Take 15 mLs by mouth 3 (three) times daily as needed (constipation).     . tamsulosin (  FLOMAX) 0.4 MG CAPS capsule TAKE 1 CAPSULE DAILY 90 capsule 3  . triamcinolone (NASACORT) 55 MCG/ACT AERO nasal inhaler Place 2 sprays into the nose daily. 1 Inhaler 12  . ZINC OXIDE EX Apply 1 application topically as needed (itching).    . fluticasone (FLONASE) 50 MCG/ACT nasal spray Place 2 sprays into both nostrils once for 1 dose. 16 g 0   No current facility-administered medications for this visit.    Allergies:   Amiodarone hcl and Ace inhibitors    Social History:  The patient  reports that he has quit smoking. His smoking use included cigarettes. He has a 52.00 pack-year smoking history. He has never used smokeless tobacco. He reports that he does not drink alcohol or use drugs.   Family History:  The patient's family history includes Diabetes in his brother; Esophageal cancer in his father.    ROS:  Please see the history of present illness.   Otherwise, review of systems are positive for nosebleeds.   All other systems are reviewed and negative.    PHYSICAL EXAM: VS:  BP 114/64   Pulse 92   Ht 5\' 10"  (1.778 m)   Wt 171 lb (77.6 kg)   SpO2 92%   BMI 24.54 kg/m  , BMI Body mass index is 24.54 kg/m. GEN: Well nourished, well  developed, in no acute distress ; mild confused at times HEENT: normal  Neck: no JVD, carotid bruits, or masses Cardiac: RRR; no murmurs, rubs, or gallops,no edema  Respiratory:  clear to auscultation bilaterally, normal work of breathing GI: soft, nontender, nondistended, + BS MS: no deformity or atrophy  Skin: warm and dry, no rash Neuro:  Strength and sensation are intact Psych: euthymic mood, full affect; some confusion   EKG:   The ekg ordered 11/20 demonstrates V paced   Recent Labs: 04/29/2019: ALT 20; BUN 14; Creatinine, Ser 0.86; Potassium 5.1; Sodium 134; TSH 0.50 06/20/2019: Hemoglobin 12.8; Platelets 225   Lipid Panel    Component Value Date/Time   CHOL 104 04/29/2019 1104   TRIG 65.0 04/29/2019 1104   HDL 43.90 04/29/2019 1104   CHOLHDL 2 04/29/2019 1104   VLDL 13.0 04/29/2019 1104   LDLCALC 47 04/29/2019 1104   LDLDIRECT 159.2 09/29/2006 1112     Other studies Reviewed: Additional studies/ records that were reviewed today with results demonstrating: LDL 47 in 3/21.   ASSESSMENT AND PLAN:  1. CAD: s/p CABG in 2017.  No angina.  Continue to stay on medical therapy.  Continue atorvastatin.  2. Atrial fibrillation/atypical atrial flutter: Status post AV node ablation and left atrial appendage clipping.  He has been on Eliquis for stroke prevention but this was stopped due to nose bleeding.  WIll check with Dr. [name] for his thoughts.  He has had LAA clipping.  3. Hypertension: The current medical regimen is effective;  continue present plan and medications. 4. Chronic diastolic heart failure: Appears euvolemic.   Current medicines are reviewed at length with the patient today.  The patient concerns regarding his medicines were addressed.  The following changes have been made:  No change  Labs/ tests ordered today include:  No orders of the defined types were placed in this encounter.   Recommend 150 minutes/week of aerobic exercise Low fat, low carb, high  fiber diet recommended  Disposition:   FU in 1 year   Signed, Larae Grooms, MD  07/01/2019 2:38 PM    [address],  [place], [place]  [address] Phone: [phone]; Fax: [phone]

## 2019-07-01 NOTE — Patient Instructions (Signed)
Medication Instructions:  Your physician recommends that you continue on your current medications as directed. Please refer to the Current Medication list given to you today.  *If you need a refill on your cardiac medications before your next appointment, please call your pharmacy*   Lab Work: None ordered  If you have labs (blood work) drawn today and your tests are completely normal, you will receive your results only by: . MyChart Message (if you have MyChart) OR . A paper copy in the mail If you have any lab test that is abnormal or we need to change your treatment, we will call you to review the results.   Testing/Procedures: None ordered   Follow-Up: At [facility], you and your health needs are our priority.  As part of our continuing mission to provide you with exceptional heart care, we have created designated Provider Care Teams.  These Care Teams include your primary Cardiologist (physician) and Advanced Practice Providers (APPs -  Physician Assistants and Nurse Practitioners) who all work together to provide you with the care you need, when you need it.  We recommend signing up for the patient portal called "MyChart".  Sign up information is provided on this After Visit Summary.  MyChart is used to connect with patients for Virtual Visits (Telemedicine).  Patients are able to view lab/test results, encounter notes, upcoming appointments, etc.  Non-urgent messages can be sent to your provider as well.   To learn more about what you can do with MyChart, go to [email or web address].    Your next appointment:   12 month(s)  The format for your next appointment:   In Person  Provider:   You may see Jayadeep Varanasi, MD or one of the following Advanced Practice Providers on your designated Care Team:    Dayna Dunn, PA-C  Michele Lenze, PA-C    Other Instructions None  

## 2019-07-05 ENCOUNTER — Other Ambulatory Visit: Payer: Self-pay | Admitting: Internal Medicine

## 2019-07-05 MED ORDER — HYDROCODONE-ACETAMINOPHEN 10-325 MG PO TABS: 1.0000 | ORAL_TABLET | Freq: Every day | ORAL | 0 refills | Status: DC | PRN

## 2019-07-05 NOTE — Telephone Encounter (Signed)
Check [place] registry last filled 06/03/2019. MD is out of the office this week pls advise on refill.Marland KitchenJohny Chess

## 2019-07-14 ENCOUNTER — Ambulatory Visit (INDEPENDENT_AMBULATORY_CARE_PROVIDER_SITE_OTHER): Payer: Medicare Other

## 2019-07-14 ENCOUNTER — Ambulatory Visit (INDEPENDENT_AMBULATORY_CARE_PROVIDER_SITE_OTHER): Payer: Medicare Other | Admitting: Internal Medicine

## 2019-07-14 ENCOUNTER — Encounter: Payer: Self-pay | Admitting: Internal Medicine

## 2019-07-14 ENCOUNTER — Other Ambulatory Visit: Payer: Self-pay

## 2019-07-14 VITALS — BP 120/82 | HR 77 | Temp 98.2°F | Ht 70.0 in | Wt 171.0 lb

## 2019-07-14 VITALS — BP 120/80 | HR 71 | Temp 98.2°F | Resp 16 | Ht 70.0 in | Wt 171.2 lb

## 2019-07-14 DIAGNOSIS — E782 Mixed hyperlipidemia: Secondary | ICD-10-CM | POA: Diagnosis not present

## 2019-07-14 DIAGNOSIS — Z0000 Encounter for general adult medical examination without abnormal findings: Secondary | ICD-10-CM

## 2019-07-14 DIAGNOSIS — Z7901 Long term (current) use of anticoagulants: Secondary | ICD-10-CM | POA: Diagnosis not present

## 2019-07-14 DIAGNOSIS — R739 Hyperglycemia, unspecified: Secondary | ICD-10-CM | POA: Diagnosis not present

## 2019-07-14 DIAGNOSIS — G8929 Other chronic pain: Secondary | ICD-10-CM

## 2019-07-14 DIAGNOSIS — M545 Low back pain, unspecified: Secondary | ICD-10-CM

## 2019-07-14 NOTE — Patient Instructions (Signed)
Jonathan Orozco , Thank you for taking time to come for your Medicare Wellness Visit. I appreciate your ongoing commitment to your health goals. Please review the following plan we discussed and let me know if I can assist you in the future.   Screening recommendations/referrals: Colonoscopy: 12/08/2014; not recommended due to age Recommended yearly ophthalmology/optometry visit for glaucoma screening and checkup Recommended yearly dental visit for hygiene and checkup  Vaccinations: Influenza vaccine: 10/08/2018 Pneumococcal vaccine: completed Tdap vaccine: 06/19/2016; due every 10 years Shingles vaccine: will check with pharmacy Covid-19: completed  Advanced directives: Yes  Conditions/risks identified: Yes  Next appointment: Please schedule your 1 year Medicare Wellness Visit with your Health Coach.  Preventive Care 84 Years and Older, Male Preventive care refers to lifestyle choices and visits with your health care provider that can promote health and wellness. What does preventive care include?  A yearly physical exam. This is also called an annual well check.  Dental exams once or twice a year.  Routine eye exams. Ask your health care provider how often you should have your eyes checked.  Personal lifestyle choices, including:  Daily care of your teeth and gums.  Regular physical activity.  Eating a healthy diet.  Avoiding tobacco and drug use.  Limiting alcohol use.  Practicing safe sex.  Taking low doses of aspirin every day.  Taking vitamin and mineral supplements as recommended by your health care provider. What happens during an annual well check? The services and screenings done by your health care provider during your annual well check will depend on your age, overall health, lifestyle risk factors, and family history of disease. Counseling  Your health care provider may ask you questions about your:  Alcohol use.  Tobacco use.  Drug use.  Emotional  well-being.  Home and relationship well-being.  Sexual activity.  Eating habits.  History of falls.  Memory and ability to understand (cognition).  Work and work Statistician. Screening  You may have the following tests or measurements:  Height, weight, and BMI.  Blood pressure.  Lipid and cholesterol levels. These may be checked every 5 years, or more frequently if you are over 50 years old.  Skin check.  Lung cancer screening. You may have this screening every year starting at age 65 if you have a 30-pack-year history of smoking and currently smoke or have quit within the past 15 years.  Fecal occult blood test (FOBT) of the stool. You may have this test every year starting at age 33.  Flexible sigmoidoscopy or colonoscopy. You may have a sigmoidoscopy every 5 years or a colonoscopy every 10 years starting at age 46.  Prostate cancer screening. Recommendations will vary depending on your family history and other risks.  Hepatitis C blood test.  Hepatitis B blood test.  Sexually transmitted disease (STD) testing.  Diabetes screening. This is done by checking your blood sugar (glucose) after you have not eaten for a while (fasting). You may have this done every 1-3 years.  Abdominal aortic aneurysm (AAA) screening. You may need this if you are a current or former smoker.  Osteoporosis. You may be screened starting at age 48 if you are at high risk. Talk with your health care provider about your test results, treatment options, and if necessary, the need for more tests. Vaccines  Your health care provider may recommend certain vaccines, such as:  Influenza vaccine. This is recommended every year.  Tetanus, diphtheria, and acellular pertussis (Tdap, Td) vaccine. You may need a  Td booster every 10 years.  Zoster vaccine. You may need this after age 69.  Pneumococcal 13-valent conjugate (PCV13) vaccine. One dose is recommended after age 28.  Pneumococcal  polysaccharide (PPSV23) vaccine. One dose is recommended after age 35. Talk to your health care provider about which screenings and vaccines you need and how often you need them. This information is not intended to replace advice given to you by your health care provider. Make sure you discuss any questions you have with your health care provider. Document Released: 02/16/2015 Document Revised: 10/10/2015 Document Reviewed: 11/21/2014 Elsevier Interactive Patient Education  2017 [place] Prevention in the Home Falls can cause injuries. They can happen to people of all ages. There are many things you can do to make your home safe and to help prevent falls. What can I do on the outside of my home?  Regularly fix the edges of walkways and driveways and fix any cracks.  Remove anything that might make you trip as you walk through a door, such as a raised step or threshold.  Trim any bushes or trees on the path to your home.  Use bright outdoor lighting.  Clear any walking paths of anything that might make someone trip, such as rocks or tools.  Regularly check to see if handrails are loose or broken. Make sure that both sides of any steps have handrails.  Any raised decks and porches should have guardrails on the edges.  Have any leaves, snow, or ice cleared regularly.  Use sand or salt on walking paths during winter.  Clean up any spills in your garage right away. This includes oil or grease spills. What can I do in the bathroom?  Use night lights.  Install grab bars by the toilet and in the tub and shower. Do not use towel bars as grab bars.  Use non-skid mats or decals in the tub or shower.  If you need to sit down in the shower, use a plastic, non-slip stool.  Keep the floor dry. Clean up any water that spills on the floor as soon as it happens.  Remove soap buildup in the tub or shower regularly.  Attach bath mats securely with double-sided non-slip rug  tape.  Do not have throw rugs and other things on the floor that can make you trip. What can I do in the bedroom?  Use night lights.  Make sure that you have a light by your bed that is easy to reach.  Do not use any sheets or blankets that are too big for your bed. They should not hang down onto the floor.  Have a firm chair that has side arms. You can use this for support while you get dressed.  Do not have throw rugs and other things on the floor that can make you trip. What can I do in the kitchen?  Clean up any spills right away.  Avoid walking on wet floors.  Keep items that you use a lot in easy-to-reach places.  If you need to reach something above you, use a strong step stool that has a grab bar.  Keep electrical cords out of the way.  Do not use floor polish or wax that makes floors slippery. If you must use wax, use non-skid floor wax.  Do not have throw rugs and other things on the floor that can make you trip. What can I do with my stairs?  Do not leave any items on the stairs.  Make sure that there are handrails on both sides of the stairs and use them. Fix handrails that are broken or loose. Make sure that handrails are as long as the stairways.  Check any carpeting to make sure that it is firmly attached to the stairs. Fix any carpet that is loose or worn.  Avoid having throw rugs at the top or bottom of the stairs. If you do have throw rugs, attach them to the floor with carpet tape.  Make sure that you have a light switch at the top of the stairs and the bottom of the stairs. If you do not have them, ask someone to add them for you. What else can I do to help prevent falls?  Wear shoes that:  Do not have high heels.  Have rubber bottoms.  Are comfortable and fit you well.  Are closed at the toe. Do not wear sandals.  If you use a stepladder:  Make sure that it is fully opened. Do not climb a closed stepladder.  Make sure that both sides of the  stepladder are locked into place.  Ask someone to hold it for you, if possible.  Clearly mark and make sure that you can see:  Any grab bars or handrails.  First and last steps.  Where the edge of each step is.  Use tools that help you move around (mobility aids) if they are needed. These include:  Canes.  Walkers.  Scooters.  Crutches.  Turn on the lights when you go into a dark area. Replace any light bulbs as soon as they burn out.  Set up your furniture so you have a clear path. Avoid moving your furniture around.  If any of your floors are uneven, fix them.  If there are any pets around you, be aware of where they are.  Review your medicines with your doctor. Some medicines can make you feel dizzy. This can increase your chance of falling. Ask your doctor what other things that you can do to help prevent falls. This information is not intended to replace advice given to you by your health care provider. Make sure you discuss any questions you have with your health care provider. Document Released: 11/16/2008 Document Revised: 06/28/2015 Document Reviewed: 02/24/2014 Elsevier Interactive Patient Education  2017 [employer].

## 2019-07-14 NOTE — Patient Instructions (Signed)
Please continue all other medications as before, and refills have been done if requested.  Please have the pharmacy call with any other refills you may need.  Please continue your efforts at being more active, low cholesterol diet, and weight control..  Please keep your appointments with your specialists as you may have planned  No need further blood work today  Please make an Appointment to return in 6 months, or sooner if needed

## 2019-07-14 NOTE — Progress Notes (Addendum)
Subjective:    Patient ID: Jonathan Orozco, male    DOB: 03-Oct-1928, 86 y.o.   MRN: [id number]  HPI  Here to f/u; overall doing ok,  Pt denies chest pain, increasing sob or doe, wheezing, orthopnea, PND, increased LE swelling, palpitations, dizziness or syncope.  Pt denies new neurological symptoms such as new headache, or facial or extremity weakness or numbness.  Pt denies polydipsia, polyuria, or low sugar episode.  Pt states overall good compliance with meds, mostly trying to follow appropriate diet, with wt overall stable,  but little exercise however. Now off eliquis after 4 nose bleeds, no further nose bleed.  Pt continues to have recurring LBP without change in severity, bowel or bladder change, fever, wt loss,  worsening LE pain/numbness/weakness, gait change or falls.   Past Medical History:  Diagnosis Date  . ABUSE, ALCOHOL, IN REMISSION 04/08/2007  . ALLERGIC RHINITIS 09/29/2006  . Arthritis    "hands" (07/02/2016)  . Atrial fibrillation ([place])   . Atrial fibrillation with RVR ([place]) 07/02/2016  . Atrial flutter ([place]) 03/28/2010   a. s/p prior rfca;  b. recurrent paroxysmal flutter 04/2012;  c. pradaxa initiated 04/2012.  [name] BENIGN PROSTATIC HYPERTROPHY 09/29/2006  . BPPV (benign paroxysmal positional vertigo) 04/08/2007  . CAD (coronary artery disease)    a. LHC 2/17: EF 55-65%, LM 75, pLAD 75, oD1 75, oD2 65, D3 85, oLCx 99, oOM1 75, pRCA 25 >> CABG  . Carotid stenosis    a. Carotid US 2/17:  Bilateral ICA 1-39% ICA  . Chronic lower back pain   . COLONIC POLYPS, HX OF 06/23/2007  . 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 Diverticulosis   . ERECTILE DYSFUNCTION 09/29/2006  . GERD (gastroesophageal reflux disease)   . GLUCOSE INTOLERANCE 03/19/2010  . HYPERLIPIDEMIA 09/29/2006  . HYPERTENSION 09/29/2006  . Long term (current) use of anticoagulants 04/26/2010  . MELANOMA, MALIGNANT, SKIN NOS 09/29/2006   other skin cancers -no further melanoma  . OSTEOARTHROSIS NOS, OTHER [facility] SITE  09/29/2006  . Other specified forms of hearing loss 08/10/2009  . Pneumonia ~ 1969  . Presence of permanent cardiac pacemaker 06/02/2016  . VENTRICULAR TACHYCARDIA 09/29/2006   Past Surgical History:  Procedure Laterality Date  . AV NODE ABLATION  07/02/2016  . AV NODE ABLATION N/A 07/02/2016   Procedure: AV Node Ablation;  Surgeon: Deboraha Sprang, MD;  Location: [place] [facility];  Service: Cardiovascular;  Laterality: N/A;  . CARDIAC CATHETERIZATION N/A 03/12/2015   Procedure: Left Heart Cath and Coronary Angiography;  Surgeon: [name], MD;  Location: [place] [facility];  Service: Cardiovascular;  Laterality: N/A;  . CARDIOVERSION N/A 12/19/2015   Procedure: CARDIOVERSION;  Surgeon: [name], MD;  Location: [facility] ENDOSCOPY;  Service: Cardiovascular;  Laterality: N/A;  . CARDIOVERSION N/A 03/24/2016   Procedure: CARDIOVERSION;  Surgeon: [name], MD;  Location: [place];  Service: Cardiovascular;  Laterality: N/A;  . CATARACT EXTRACTION W/ INTRAOCULAR LENS  IMPLANT, BILATERAL Bilateral   . CLIPPING OF ATRIAL APPENDAGE N/A 03/14/2015   Procedure: CLIPPING OF ATRIAL APPENDAGE;  Surgeon: [name], MD;  Location: [place];  Service: Open Heart Surgery;  Laterality: N/A;  . COLONOSCOPY WITH PROPOFOL N/A 12/08/2014   Procedure: COLONOSCOPY WITH PROPOFOL;  Surgeon: [name], MD;  Location: [facility] ENDOSCOPY;  Service: Endoscopy;  Laterality: N/A;  . CORONARY ARTERY BYPASS GRAFT N/A 03/14/2015   Procedure: CORONARY ARTERY BYPASS GRAFTING (CABG) x 4 (LIMA to LAD, SVG to DIAGONAL  2, SVG SEQUENTIALLY to OM1 and OM2);  Surgeon: [name], MD;  Location: [place];  Service: Open Heart Surgery;  Laterality: N/A;  . INCISION AND DRAINAGE N/A 07/03/2016   Procedure: INCISION AND DRAINAGE of CHEST ABSCESS;  Surgeon: [name], MD;  Location: [place];  Service: Thoracic;  Laterality: N/A;  . INSERT / REPLACE / REMOVE PACEMAKER  06/02/2016  . JOINT REPLACEMENT    . KNEE  ARTHROSCOPY Left   . MELANOMA EXCISION     "upper back"  . PACEMAKER IMPLANT N/A 06/02/2016   Procedure: Pacemaker Implant;  Surgeon: Deboraha Sprang, MD;  Location: [place] [facility] LAB;  Service: Cardiovascular;  Laterality: N/A;  . SHOULDER OPEN ROTATOR CUFF REPAIR Left   . SKIN CANCER EXCISION Right    "cheek; real deep"  . TEE WITHOUT CARDIOVERSION N/A 03/14/2015   Procedure: TRANSESOPHAGEAL ECHOCARDIOGRAM (TEE);  Surgeon: [name], MD;  Location: [place];  Service: Open Heart Surgery;  Laterality: N/A;  . TEE WITHOUT CARDIOVERSION N/A 12/19/2015   Procedure: TRANSESOPHAGEAL ECHOCARDIOGRAM (TEE);  Surgeon: [name], MD;  Location: [place];  Service: Cardiovascular;  Laterality: N/A;  . TEE WITHOUT CARDIOVERSION N/A 03/24/2016   Procedure: TRANSESOPHAGEAL ECHOCARDIOGRAM (TEE);  Surgeon: [name], MD;  Location: [place];  Service: Cardiovascular;  Laterality: N/A;  . TONSILLECTOMY    . TOTAL KNEE ARTHROPLASTY Right     reports that he has quit smoking. His smoking use included cigarettes. He has a 52.00 pack-year smoking history. He has never used smokeless tobacco. He reports that he does not drink alcohol and does not use drugs. family history includes Diabetes in his brother; Esophageal cancer in his father. Allergies  Allergen Reactions  . Amiodarone Hcl Other (See Comments)    Patient reports photosensitivity  . Ace Inhibitors Cough   Current Outpatient Medications on File Prior to Visit  Medication Sig Dispense Refill  . atorvastatin (LIPITOR) 10 MG tablet TAKE 1 TABLET DAILY 90 tablet 3  . doxylamine, Sleep, (UNISOM) 25 MG tablet Take 25 mg by mouth at bedtime as needed for sleep.     [name] HYDROcodone-acetaminophen (NORCO) 10-325 MG tablet Take 1 tablet by mouth daily as needed. 30 tablet 0  . Multiple Vitamin (MULTIVITAMIN) tablet Take 1 tablet by mouth daily.      [name] omega-3 acid ethyl esters (LOVAZA) 1 g capsule Take by mouth.    . Psyllium  (METAMUCIL PO) Take 15 mLs by mouth 3 (three) times daily as needed (constipation).     . tamsulosin (FLOMAX) 0.4 MG CAPS capsule TAKE 1 CAPSULE DAILY 90 capsule 3  . triamcinolone (NASACORT) 55 MCG/ACT AERO nasal inhaler Place 2 sprays into the nose daily. 1 Inhaler 12  . fluticasone (FLONASE) 50 MCG/ACT nasal spray Place 2 sprays into both nostrils once for 1 dose. 16 g 0   No current facility-administered medications on file prior to visit.   Review of Systems All otherwise neg per pt    Objective:   Physical Exam BP 120/82 (BP Location: Left Arm, Patient Position: Sitting, Cuff Size: Large)   Pulse 77   Temp 98.2 F (36.8 C) (Oral)   Ht 5\' 10"  (1.778 m)   Wt 171 lb (77.6 kg)   SpO2 98%   BMI 24.54 kg/m  VS noted,  Constitutional: Pt appears in NAD HENT: Head: NCAT.  Right Ear: External ear normal.  Left Ear: External ear normal.  Eyes: . Pupils are equal, round, and reactive  to light. Conjunctivae and EOM are normal Nose: without d/c or deformity Neck: Neck supple. Gross normal ROM Cardiovascular: Normal rate and regular rhythm.   Pulmonary/Chest: Effort normal and breath sounds without rales or wheezing.  Abd:  Soft, NT, ND, + BS, no organomegaly Neurological: Pt is alert. At baseline orientation, motor grossly intact Skin: Skin is warm. No rashes, other new lesions, no LE edema Psychiatric: Pt behavior is normal without agitation  All otherwise neg per pt Lab Results  Component Value Date   WBC 10.7 (H) 06/20/2019   HGB 12.8 (L) 06/20/2019   HCT 40.7 06/20/2019   PLT 225 06/20/2019   GLUCOSE 102 (H) 04/29/2019   CHOL 104 04/29/2019   TRIG 65.0 04/29/2019   HDL 43.90 04/29/2019   LDLDIRECT 159.2 09/29/2006   LDLCALC 47 04/29/2019   ALT 20 04/29/2019   AST 20 04/29/2019   NA 134 (L) 04/29/2019   K 5.1 04/29/2019   CL 100 04/29/2019   CREATININE 0.86 04/29/2019   BUN 14 04/29/2019   CO2 30 04/29/2019   TSH 0.50 04/29/2019   PSA 3.75 06/12/2016   INR 3.1  (A) 06/01/2018   HGBA1C 5.7 04/29/2019      Assessment & Plan:

## 2019-07-14 NOTE — Progress Notes (Signed)
Subjective:   Jonathan Orozco is a 86 y.o. male who presents for Medicare Annual/Subsequent preventive examination.  Review of Systems:  No ROS. Medicare Wellness Visit Cardiac Risk Factors include: advanced age (>35men, >7 women);dyslipidemia     Objective:    Vitals: BP 120/80 (BP Location: Left Arm, Patient Position: Sitting, Cuff Size: Normal)    Pulse 71    Temp 98.2 F (36.8 C)    Resp 16    Ht 5\' 10"  (1.778 m)    Wt 171 lb 3.2 oz (77.7 kg)    SpO2 98%    BMI 24.56 kg/m   Body mass index is 24.56 kg/m.  Advanced Directives 07/14/2019 06/20/2019 05/30/2019 05/30/2019 05/27/2019 12/31/2016 07/02/2016  Does Patient Have a Medical Advance Directive? Yes No No No No Yes Yes  Type of Paramedic of [place];Living will - - - - Press photographer;Living will [place];Living will  Does patient want to make changes to medical advance directive? No - Patient declined - - - - No - Patient declined No - Patient declined  Copy of Healthcare Power of Attorney in Chart? - - - - - (No Data) No - copy requested  Would patient like information on creating a medical advance directive? - No - Patient declined - No - Patient declined - - -  Pre-existing out of facility DNR order (yellow form or pink MOST form) - - - - - - -    Tobacco Social History   Tobacco Use  Smoking Status Former Smoker   Packs/day: 1.00   Years: 52.00   Pack years: 52.00   Types: Cigarettes  Smokeless Tobacco Never Used  Tobacco Comment   07/02/2016 "quit before 2000"     Counseling given: No Comment: 07/02/2016 "quit before 2000"   Clinical Intake:  Pre-visit preparation completed: Yes  Pain : No/denies pain Pain Score: 0-No pain     Nutritional Risks: None Diabetes: No  How often do you need to have someone help you when you read instructions, pamphlets, or other written materials from your doctor or pharmacy?: 1 - Never What is the last grade level  you completed in school?: Bachelor's Degree  Interpreter Needed?: No  Information entered by :: Alistair Senft N. Lowell Guitar, LPN  Past Medical History:  Diagnosis Date   ABUSE, ALCOHOL, IN REMISSION 04/08/2007   ALLERGIC RHINITIS 09/29/2006   Arthritis    "hands" (07/02/2016)   Atrial fibrillation ([facility])    Atrial fibrillation with RVR ([place]) 07/02/2016   Atrial flutter ([place]) 03/28/2010   a. s/p prior rfca;  b. recurrent paroxysmal flutter 04/2012;  c. pradaxa initiated 04/2012.   BENIGN PROSTATIC HYPERTROPHY 09/29/2006   BPPV (benign paroxysmal positional vertigo) 04/08/2007   CAD (coronary artery disease)    a. LHC 2/17: EF 55-65%, LM 75, pLAD 75, oD1 75, oD2 65, D3 85, oLCx 99, oOM1 75, pRCA 25 >> CABG   Carotid stenosis    a. 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   Diverticulosis    ERECTILE DYSFUNCTION 09/29/2006   GERD (gastroesophageal reflux disease)    GLUCOSE INTOLERANCE 03/19/2010   HYPERLIPIDEMIA 09/29/2006   HYPERTENSION 09/29/2006   Long term (current) use of anticoagulants 04/26/2010   MELANOMA, MALIGNANT, SKIN NOS 09/29/2006   other skin cancers -no further melanoma   OSTEOARTHROSIS NOS, OTHER SPEC  SITE 09/29/2006   Other specified forms of hearing loss 08/10/2009   Pneumonia ~ 1969   Presence of permanent cardiac pacemaker 06/02/2016   VENTRICULAR TACHYCARDIA 09/29/2006   Past Surgical History:  Procedure Laterality Date   AV NODE ABLATION  07/02/2016   AV NODE ABLATION N/A 07/02/2016   Procedure: AV Node Ablation;  Surgeon: Deboraha Sprang, MD;  Location: [place] [facility];  Service: Cardiovascular;  Laterality: N/A;   CARDIAC CATHETERIZATION N/A 03/12/2015   Procedure: Left Heart Cath and Coronary Angiography;  Surgeon: [name], MD;  Location: [place] [facility] LAB;  Service: Cardiovascular;  Laterality: N/A;   CARDIOVERSION N/A 12/19/2015    Procedure: CARDIOVERSION;  Surgeon: [name], MD;  Location: [facility]OSCOPY;  Service: Cardiovascular;  Laterality: N/A;   CARDIOVERSION N/A 03/24/2016   Procedure: CARDIOVERSION;  Surgeon: [name], MD;  Location: [facility];  Service: Cardiovascular;  Laterality: N/A;   CATARACT EXTRACTION W/ INTRAOCULAR LENS  IMPLANT, BILATERAL Bilateral    CLIPPING OF ATRIAL APPENDAGE N/A 03/14/2015   Procedure: CLIPPING OF ATRIAL APPENDAGE;  Surgeon: [name], MD;  Location: [place];  Service: Open Heart Surgery;  Laterality: N/A;   COLONOSCOPY WITH PROPOFOL N/A 12/08/2014   Procedure: COLONOSCOPY WITH PROPOFOL;  Surgeon: [name], MD;  [facility] ENDOSCOPY;  Service: Endoscopy;  Laterality: N/A;   CORONARY ARTERY BYPASS GRAFT N/A 03/14/2015   Procedure: CORONARY ARTERY BYPASS GRAFTING (CABG) x 4 (LIMA to LAD, SVG to DIAGONAL 2, SVG SEQUENTIALLY to OM1 and OM2);  Surgeon: [name], MD;  Location: [place];  Service: Open Heart Surgery;  Laterality: N/A;   INCISION AND DRAINAGE N/A 07/03/2016   Procedure: INCISION AND DRAINAGE of CHEST ABSCESS;  Surgeon: [name], MD;  Location: [place];  Service: Thoracic;  Laterality: N/A;   INSERT / REPLACE / REMOVE PACEMAKER  06/02/2016   JOINT REPLACEMENT     KNEE ARTHROSCOPY Left    MELANOMA EXCISION     "upper back"   PACEMAKER IMPLANT N/A 06/02/2016   Procedure: Pacemaker Implant;  Surgeon: Deboraha Sprang, MD;  Location: [place] CV LAB;  Service: Cardiovascular;  Laterality: N/A;   SHOULDER OPEN ROTATOR CUFF REPAIR Left    SKIN CANCER EXCISION Right    "cheek; real deep"   TEE WITHOUT CARDIOVERSION N/A 03/14/2015   Procedure: TRANSESOPHAGEAL ECHOCARDIOGRAM (TEE);  Surgeon: [name], MD;  Location: [place];  Service: Open Heart Surgery;  Laterality: N/A;   TEE WITHOUT CARDIOVERSION N/A 12/19/2015   Procedure: TRANSESOPHAGEAL ECHOCARDIOGRAM (TEE);  Surgeon: [name], MD;  Location: [place];  Service: Cardiovascular;  Laterality: N/A;   TEE WITHOUT CARDIOVERSION N/A 03/24/2016   Procedure: TRANSESOPHAGEAL ECHOCARDIOGRAM (TEE);  Surgeon: [name], MD;  Location: [facility] ENDOSCOPY;  Service: Cardiovascular;  Laterality: N/A;   TONSILLECTOMY     TOTAL KNEE ARTHROPLASTY Right    Family History  Problem Relation Age of Onset   Diabetes Brother    Esophageal cancer Father    Colon cancer Neg Hx    Stomach cancer Neg Hx    Social History   Socioeconomic History   Marital status: Married    Spouse name: Not on file   Number of children: Not on file   Years of education: Not on file   Highest education level: Not on file  Occupational History   Occupation: Retired, Press photographer  Tobacco Use   Smoking status: Former Smoker    Packs/day: 1.00  Years: 52.00    Pack years: 52.00    Types: Cigarettes   Smokeless tobacco: Never Used   Tobacco comment: 07/02/2016 "quit before 2000"  Vaping Use   Vaping Use: Never used  Substance and Sexual Activity   Alcohol use: No    Comment: 07/02/2016 "quit before 2000"   Drug use: No   Sexual activity: Never  Other Topics Concern   Not on file  Social History Narrative   Married, 3 children. Retired Press photographer. Lives in [place] with  wife and kids. He is retired from Press photographer.    Social Determinants of Health   Financial Resource Strain: Low Risk    Difficulty of Paying Living Expenses: Not hard at all  Food Insecurity: No Food Insecurity   Worried About Charity fundraiser in the Last Year: Never true   [place] in the Last Year: Never true  Transportation Needs: No Transportation Needs   Lack of Transportation (Medical): No   Lack of Transportation (Non-Medical): No  Physical Activity: Sufficiently Active   Days of Exercise per Week: 5 days   Minutes of Exercise per Session: 30 min  Stress: No Stress Concern Present   Feeling of Stress : Not at all  Social Connections: Socially Isolated    Frequency of Communication with Friends and Family: Once a week   Frequency of Social Gatherings with Friends and Family: Once a week   Attends Religious Services: Never   Marine scientist or Organizations: No   Attends Archivist Meetings: Never   Marital Status: Married    Outpatient Encounter Medications as of 07/14/2019  Medication Sig   atorvastatin (LIPITOR) 10 MG tablet TAKE 1 TABLET DAILY   b complex vitamins tablet Take 1 tablet by mouth daily.   doxylamine, Sleep, (UNISOM) 25 MG tablet Take 25 mg by mouth at bedtime as needed for sleep.    HYDROcodone-acetaminophen (NORCO) 10-325 MG tablet Take 1 tablet by mouth daily as needed.   Multiple Vitamin (MULTIVITAMIN) tablet Take 1 tablet by mouth daily.     omega-3 acid ethyl esters (LOVAZA) 1 g capsule Take by mouth.   Psyllium (METAMUCIL PO) Take 15 mLs by mouth 3 (three) times daily as needed (constipation).    tamsulosin (FLOMAX) 0.4 MG CAPS capsule TAKE 1 CAPSULE DAILY   triamcinolone (NASACORT) 55 MCG/ACT AERO nasal inhaler Place 2 sprays into the nose daily.   apixaban (ELIQUIS) 5 MG TABS tablet Take 1 tablet (5 mg total) by mouth 2 (two) times daily. (Patient not taking: Reported on 07/14/2019)   cephALEXin (KEFLEX) 500 MG capsule Take 1 capsule (500 mg total) by mouth 3 (three) times daily. (Patient not taking: Reported on 07/14/2019)   fluticasone (FLONASE) 50 MCG/ACT nasal spray Place 2 sprays into both nostrils once for 1 dose.   mupirocin cream (BACTROBAN) 2 % Apply 1 application topically 2 (two) times daily. (Patient not taking: Reported on 07/14/2019)   mupirocin ointment (BACTROBAN) 2 % Place 1 application into the nose 2 (two) times daily. (Patient not taking: Reported on 07/14/2019)   omeprazole (PRILOSEC) 40 MG capsule Take 1 capsule (40 mg total) by mouth daily. (Patient not taking: Reported on 07/14/2019)   ZINC OXIDE EX Apply 1 application topically as needed (itching). (Patient not  taking: Reported on 07/14/2019)   No facility-administered encounter medications on file as of 07/14/2019.    Activities of Daily Living In your present state of health, do you have any difficulty performing the  following activities: 07/14/2019  Hearing? Y  Comment hearing aids  Vision? N  Difficulty concentrating or making decisions? N  Walking or climbing stairs? N  Dressing or bathing? N  Doing errands, shopping? Y  Preparing Food and eating ? N  Using the Toilet? N  In the past six months, have you accidently leaked urine? N  Do you have problems with loss of bowel control? N  Managing your Medications? N  Managing your Finances? N  Housekeeping or managing your Housekeeping? N  Some recent data might be hidden    Patient Care Team: Biagio Borg, MD as PCP - General Jettie Booze, MD as PCP - Cardiology (Cardiology)   Assessment:   This is a routine wellness examination for Jonathan Orozco.  Exercise Activities and Dietary recommendations Current Exercise Habits: The patient does not participate in regular exercise at present (walk around the big yard), Exercise limited by: orthopedic condition(s) (walks on treadmill at home; no lifting weights)  Goals      Client understands the importance of follow-up with providers by attending scheduled visits (pt-stated)      To see next year.       Fall Risk Fall Risk  07/14/2019 04/29/2019 07/02/2018 07/01/2017 06/30/2017  Falls in the past year? 0 0 0 No No  Number falls in past yr: 0 - - - -  Comment - - - - -  Injury with Fall? 0 - - - -  Comment - - - - -  Risk for fall due to : Orthopedic patient - - - -  Follow up Falls evaluation completed;Education provided - - - -   Is the patient's home free of loose throw rugs in walkways, pet beds, electrical cords, etc?   yes      Grab bars in the bathroom? yes      Handrails on the stairs?   yes      Adequate lighting?   yes  Timed Get Up and Go Performed: indicated  Depression  Screen PHQ 2/9 Scores 07/14/2019 04/29/2019 07/02/2018 06/30/2017  PHQ - 2 Score 0 0 0 0    Cognitive Function: declined        Immunization History  Administered Date(s) Administered   Fluad Quad(high Dose 65+) 10/08/2018   H1N1 03/14/2008   Influenza Split 11/11/2010, 10/10/2011   Influenza Whole 01/20/2007, 11/02/2007, 03/16/2009, 11/16/2009   Influenza, High Dose Seasonal PF 10/21/2013, 11/20/2015, 11/25/2016, 11/17/2017   Influenza,inj,Quad PF,6+ Mos 11/09/2012   Influenza-Unspecified 11/24/2014   Pneumococcal Conjugate-13 05/24/2013   Pneumococcal Polysaccharide-23 03/31/2006   Td 03/31/2006   Tdap 06/19/2016    Qualifies for Shingles Vaccine?  Yes  Screening Tests Health Maintenance  Topic Date Due   COVID-19 Vaccine (1) Never done   INFLUENZA VACCINE  09/04/2019   TETANUS/TDAP  06/20/2026   PNA vac Low Risk Adult  Completed   Cancer Screenings: Lung: Low Dose CT Chest recommended if Age 15-80 years, 30 pack-year currently smoking OR have quit w/in 15years. Patient does not qualify. Colorectal: Not recommended due to age  Additional Screenings: Hepatitis C Screening: never done      Plan:      I have personally reviewed and noted the following in the patients chart:    Medical and social history  Use of alcohol, tobacco or illicit drugs   Current medications and supplements  Functional ability and status  Nutritional status  Physical activity  Advanced directives  List of other physicians  Hospitalizations, surgeries, and  ER visits in previous 12 months  Vitals  Screenings to include cognitive, depression, and falls  Referrals and appointments  In addition, I have reviewed and discussed with patient certain preventive protocols, quality metrics, and best practice recommendations. A written personalized care plan for preventive services as well as general preventive health recommendations were provided to patient.      Sheral Flow, LPN  07/22/120  Nurse Health Advisor

## 2019-07-17 ENCOUNTER — Encounter: Payer: Self-pay | Admitting: Internal Medicine

## 2019-07-17 NOTE — Assessment & Plan Note (Signed)
stable overall by history and exam, recent data reviewed with pt, and pt to continue medical treatment as before,  to f/u any worsening symptoms or concerns  

## 2019-07-17 NOTE — Assessment & Plan Note (Addendum)
stable overall by history and exam, recent data reviewed with pt, and pt to continue medical treatment as before,  to f/u any worsening symptoms or concerns  I spent 31 minutes in preparing to see the patient by review of recent labs, imaging and procedures, obtaining and reviewing separately obtained history, communicating with the patient and family or caregiver, ordering medications, tests or procedures, and documenting clinical information in the EHR including the differential Dx, treatment, and any further evaluation and other management of hyperglycemia, chronic lbp, eliquis use, hld,

## 2019-07-17 NOTE — Assessment & Plan Note (Signed)
Currently off eliquis after 4 nosebleed

## 2019-07-27 ENCOUNTER — Encounter: Payer: Self-pay | Admitting: Internal Medicine

## 2019-07-27 NOTE — Progress Notes (Unsigned)
Spoke w patient  S/p nasal cautery Have recommended that he resume Apixoban at 5mg  bid.  He is agreeable

## 2019-08-04 ENCOUNTER — Other Ambulatory Visit: Payer: Self-pay | Admitting: Internal Medicine

## 2019-08-04 MED ORDER — HYDROCODONE-ACETAMINOPHEN 10-325 MG PO TABS: 1.0000 | ORAL_TABLET | Freq: Every day | ORAL | 0 refills | Status: DC | PRN

## 2019-08-04 NOTE — Telephone Encounter (Signed)
Done erx 

## 2019-08-10 ENCOUNTER — Telehealth: Payer: Self-pay

## 2019-08-10 NOTE — Telephone Encounter (Signed)
Forwarded to scheduler

## 2019-08-10 NOTE — Telephone Encounter (Signed)
Pt wife left a message that someone called to make an appointment with the pt.

## 2019-08-22 ENCOUNTER — Ambulatory Visit (INDEPENDENT_AMBULATORY_CARE_PROVIDER_SITE_OTHER): Payer: Medicare Other | Admitting: *Deleted

## 2019-08-22 DIAGNOSIS — I480 Paroxysmal atrial fibrillation: Secondary | ICD-10-CM

## 2019-08-23 LAB — CUP PACEART REMOTE DEVICE CHECK
Battery Remaining Longevity: 99 mo
Battery Voltage: 3 V
Brady Statistic RV Percent Paced: 99.45 %
Date Time Interrogation Session: 20210720124344
Implantable Lead Implant Date: 20180430
Implantable Lead Location: 753860
Implantable Lead Model: 5076
Implantable Pulse Generator Implant Date: 20180430
Lead Channel Impedance Value: 361 Ohm
Lead Channel Impedance Value: 418 Ohm
Lead Channel Pacing Threshold Amplitude: 0.625 V
Lead Channel Pacing Threshold Pulse Width: 0.4 ms
Lead Channel Sensing Intrinsic Amplitude: 2.25 mV
Lead Channel Sensing Intrinsic Amplitude: 2.25 mV
Lead Channel Setting Pacing Amplitude: 2.5 V
Lead Channel Setting Pacing Pulse Width: 0.4 ms
Lead Channel Setting Sensing Sensitivity: 1.2 mV

## 2019-08-24 NOTE — Progress Notes (Signed)
Remote pacemaker transmission.   

## 2019-09-04 ENCOUNTER — Other Ambulatory Visit: Payer: Self-pay | Admitting: Internal Medicine

## 2019-09-07 ENCOUNTER — Other Ambulatory Visit: Payer: Self-pay | Admitting: Internal Medicine

## 2019-09-07 MED ORDER — HYDROCODONE-ACETAMINOPHEN 10-325 MG PO TABS: 1.0000 | ORAL_TABLET | Freq: Every day | ORAL | 0 refills | Status: DC | PRN

## 2019-09-07 NOTE — Telephone Encounter (Signed)
Sent to Dr. Phillipe. 

## 2019-09-07 NOTE — Telephone Encounter (Signed)
Done erx 

## 2019-09-09 NOTE — Telephone Encounter (Signed)
Already done aug 4 

## 2019-09-11 NOTE — Progress Notes (Deleted)
Cardiology Office Note Date:  09/11/2019  Patient ID:  Jonathan Orozco, DOB 07/26/1928, MRN [id number] PCP:  Biagio Borg, MD  Cardiologist: Dr. Irish Lack Electrophysiologist; Dr. [name]   Chief Complaint:  *** discuss a/c  History of Present Illness: Jonathan Orozco is a 86 y.o. male with history of CAD (03/14/15 CABG  LIMA > LAD, SVG > 2nd Diag and OM 1/2 , also had LAA clipping ), HTN, HFpEF, symptomatic bradycardia w/PPM, uncontrolled rapid AFib, Atypical AFlutter now s/p AVN ablation, RVOT VT intolerant of amiodarone (patient states with sun sensitivity and visual changes), chronic LBP  He saw neurology, Dr. Tomi Likens 12/08/16 2/2 gait instability, exam not felt to suggest intracranial  issue, and referred to PT.  He saw Dr. Jenny Reichmann PMD 12/23/16, his BP was 116/78. Noted however a PT note mentioned patient cancelled remaining visits 2/2 poor health and would have MD reorder when able, also mention elevated BP 170's/90's and ?? Fluctuating HR. 63-83bpm at rest, patient has reported to PT feeling nervous like he was out of rhythm.  I saw him Dec 2018, he was accompanied by his wife.  He reported feeling very well and thankful for this given he was about to be 89.  His wife was in agreement.  I discussed recent notes with PT that he had cancelled his remaining session due to not feeling well, BP, HR concerns.  He states only one day did he feel unusually poor, his BP was elevated and he blamed that on being upset with the staff, and generally felt like the exercises he did there he could do at home, this being the main reason he cancelled.  He felt like his gait is slightly improved.  He denied any kind of CP, palpitations, no SOB or DOE. No dizziness, near syncope or syncope.  He denied any bleeding or signs of bleeding.    No changes were made, he denied HTN, his BP that day 130's, and did not want to be started in any medicines reporting that historically on CCB he devloped low BP  He saw Dr. [name] July  2019, no changes were made to his medicines or device programming.  I saw him again Nov 2020 He is doing well.  He reports som back pain and slight balance issue, but no falls or near falls, reports being very carefuull.  No CP, palpitations or SOB, no dizzy spells, near syncope or syncope.   He is on Eliquis now, doing well, no bleeding or signs of bleeding He mentions his feet feel cold all of the time, no discoloration and inquires about PVD (noted on the wall poster) but decline evaluation of his LE. No changes were made.  He denies dx of HTN and did not want to consider medicines.  He subsequently has had trouble with nose bleeds requiring ER intervention.  He was seen by Dr. Irish Lack in May, was at that time off Eliquis, the patient requesting perhaps stopping a/c.  He reached ut to Dr. [name] who despite LA clipping, this is not the same as ligation and recommended continued [place].    Looks like Dr. [name] discussed with with the patient via telephone 07/27/2019 and the patient was agreeable. Mentioned that he was s/p cautery.   *** more bleeding? *** symptoms otherwise? *** CAD, meds, symptoms *** feet?   Device information MDT single chamber PPM implanted 06/02/16, Dr. [name] DEVICE DEPENDENT s/p AVNode ablation  AAD Intolerant of amiodarone (?sunsensitivity)  Past Medical History:  Diagnosis  Date  . ABUSE, ALCOHOL, IN REMISSION 04/08/2007  . ALLERGIC RHINITIS 09/29/2006  . Arthritis    "hands" (07/02/2016)  . Atrial fibrillation ([place])   . Atrial fibrillation with RVR ([place]) 07/02/2016  . Atrial flutter ([place]) 03/28/2010   a. s/p prior rfca;  b. recurrent paroxysmal flutter 04/2012;  c. pradaxa initiated 04/2012.  [name] BENIGN PROSTATIC HYPERTROPHY 09/29/2006  . BPPV (benign paroxysmal positional vertigo) 04/08/2007  . CAD (coronary artery disease)    a. LHC 2/17: EF 55-65%, LM 75, pLAD 75, oD1 75, oD2 65, D3 85, oLCx 99, oOM1 75, pRCA 25 >> CABG  . Carotid stenosis    a. Carotid US 2/17:   Bilateral ICA 1-39% ICA  . Chronic lower back pain   . COLONIC POLYPS, HX OF 06/23/2007  . 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 Diverticulosis   . ERECTILE DYSFUNCTION 09/29/2006  . GERD (gastroesophageal reflux disease)   . GLUCOSE INTOLERANCE 03/19/2010  . HYPERLIPIDEMIA 09/29/2006  . HYPERTENSION 09/29/2006  . Long term (current) use of anticoagulants 04/26/2010  . MELANOMA, MALIGNANT, SKIN NOS 09/29/2006   other skin cancers -no further melanoma  . OSTEOARTHROSIS NOS, OTHER [facility] SITE 09/29/2006  . Other specified forms of hearing loss 08/10/2009  . Pneumonia ~ 1969  . Presence of permanent cardiac pacemaker 06/02/2016  . VENTRICULAR TACHYCARDIA 09/29/2006    Past Surgical History:  Procedure Laterality Date  . AV NODE ABLATION  07/02/2016  . AV NODE ABLATION N/A 07/02/2016   Procedure: AV Node Ablation;  Surgeon: Deboraha Sprang, MD;  Location: [place] CV LAB;  Service: Cardiovascular;  Laterality: N/A;  . CARDIAC CATHETERIZATION N/A 03/12/2015   Procedure: Left Heart Cath and Coronary Angiography;  Surgeon: [name], MD;  Location: [place] CV LAB;  Service: Cardiovascular;  Laterality: N/A;  . CARDIOVERSION N/A 12/19/2015   Procedure: CARDIOVERSION;  Surgeon: [name], MD;  Location: [facility] ENDOSCOPY;  Service: Cardiovascular;  Laterality: N/A;  . CARDIOVERSION N/A 03/24/2016   Procedure: CARDIOVERSION;  Surgeon: [name], MD;  Location: [place];  Service: Cardiovascular;  Laterality: N/A;  . CATARACT EXTRACTION W/ INTRAOCULAR LENS  IMPLANT, BILATERAL Bilateral   . CLIPPING OF ATRIAL APPENDAGE N/A 03/14/2015   Procedure: CLIPPING OF ATRIAL APPENDAGE;  Surgeon: [name], MD;  Location: [place];  Service: Open Heart Surgery;  Laterality: N/A;  . COLONOSCOPY WITH PROPOFOL N/A 12/08/2014   Procedure: COLONOSCOPY WITH PROPOFOL;  Surgeon: [name], MD;  Location: [facility] ENDOSCOPY;  Service: Endoscopy;  Laterality: N/A;  . CORONARY ARTERY BYPASS GRAFT N/A  03/14/2015   Procedure: CORONARY ARTERY BYPASS GRAFTING (CABG) x 4 (LIMA to LAD, SVG to DIAGONAL 2, SVG SEQUENTIALLY to OM1 and OM2);  Surgeon: [name], MD;  Location: [place];  Service: Open Heart Surgery;  Laterality: N/A;  . INCISION AND DRAINAGE N/A 07/03/2016   Procedure: INCISION AND DRAINAGE of CHEST ABSCESS;  Surgeon: [name], MD;  Location: [place];  Service: Thoracic;  Laterality: N/A;  . INSERT / REPLACE / REMOVE PACEMAKER  06/02/2016  . JOINT REPLACEMENT    . KNEE ARTHROSCOPY Left   . MELANOMA EXCISION     "upper back"  . PACEMAKER IMPLANT N/A 06/02/2016   Procedure: Pacemaker Implant;  Surgeon: Deboraha Sprang, MD;  Location: [place] CV LAB;  Service: Cardiovascular;  Laterality: N/A;  . SHOULDER OPEN ROTATOR CUFF REPAIR Left   . SKIN CANCER EXCISION Right    "cheek; real deep"  .  TEE WITHOUT CARDIOVERSION N/A 03/14/2015   Procedure: TRANSESOPHAGEAL ECHOCARDIOGRAM (TEE);  Surgeon: [name], MD;  Location: [place];  Service: Open Heart Surgery;  Laterality: N/A;  . TEE WITHOUT CARDIOVERSION N/A 12/19/2015   Procedure: TRANSESOPHAGEAL ECHOCARDIOGRAM (TEE);  Surgeon: [name], MD;  Location: [place];  Service: Cardiovascular;  Laterality: N/A;  . TEE WITHOUT CARDIOVERSION N/A 03/24/2016   Procedure: TRANSESOPHAGEAL ECHOCARDIOGRAM (TEE);  Surgeon: [name], MD;  Location: [place];  Service: Cardiovascular;  Laterality: N/A;  . TONSILLECTOMY    . TOTAL KNEE ARTHROPLASTY Right     Current Outpatient Medications  Medication Sig Dispense Refill  . atorvastatin (LIPITOR) 10 MG tablet TAKE 1 TABLET DAILY 90 tablet 3  . b complex vitamins tablet Take 1 tablet by mouth daily.    [name] doxylamine, Sleep, (UNISOM) 25 MG tablet Take 25 mg by mouth at bedtime as needed for sleep.     . fluticasone (FLONASE) 50 MCG/ACT nasal spray Place 2 sprays into both nostrils once for 1 dose. 16 g 0  . HYDROcodone-acetaminophen (NORCO) 10-325 MG tablet  Take 1 tablet by mouth daily as needed. 30 tablet 0  . Multiple Vitamin (MULTIVITAMIN) tablet Take 1 tablet by mouth daily.      [name] omega-3 acid ethyl esters (LOVAZA) 1 g capsule Take by mouth.    . Psyllium (METAMUCIL PO) Take 15 mLs by mouth 3 (three) times daily as needed (constipation).     . tamsulosin (FLOMAX) 0.4 MG CAPS capsule TAKE 1 CAPSULE DAILY 90 capsule 3  . triamcinolone (NASACORT) 55 MCG/ACT AERO nasal inhaler Place 2 sprays into the nose daily. 1 Inhaler 12   No current facility-administered medications for this visit.    Allergies:   Amiodarone hcl and Ace inhibitors   Social History:  The patient  reports that he has quit smoking. His smoking use included cigarettes. He has a 52.00 pack-year smoking history. He has never used smokeless tobacco. He reports that he does not drink alcohol and does not use drugs.   Family History:  The patient's family history includes Diabetes in his brother; Esophageal cancer in his father.  ROS:  Please see the history of present illness.  All other systems are reviewed and otherwise negative.   PHYSICAL EXAM:  VS:  There were no vitals taken for this visit. BMI: There is no height or weight on file to calculate BMI. Well nourished, well developed, in no acute distress  HEENT: normocephalic, atraumatic  Neck: no JVD, carotid bruits or masses Cardiac:  *** RRR (paced) ; soft SM, no rubs, or gallops Lungs:  *** CTA b/l, no wheezing, rhonchi or rales  Abd: soft, nontender MS: no deformity, age appropriate atrophy Ext: *** no edema  Skin: warm and dry, no rash Neuro:  No gross deficits appreciated Psych: euthymic mood, full affect  PPM site is stable, no tethering or discomfort   EKG:  Not done today  PPM interrogation done today and reviewed by myself ***    07/02/16: Procedure: HV measurement and AV junction ablation A total of 1.2 minutes of our energy were applied at sites where the his potential was identified. Complete  heart block ensued. The patient had a junctional escape rate at about 40 bpm.  03/24/16: TEE/DCCV Study Conclusions - Left ventricle: Systolic function was normal. The estimated   ejection fraction was in the range of 55% to 60%. Wall motion was   normal; there were no regional wall motion abnormalities. -  Aortic valve: Structurally normal valve. Trileaflet; normal   thickness leaflets. There was no significant regurgitation. - Mitral valve: There was mild to moderate regurgitation. - Left atrium: No evidence of thrombus in the atrial cavity. - Right ventricle: Systolic function was normal. - Right atrium: No evidence of thrombus in the atrial cavity or   appendage. - Tricuspid valve: There was mild regurgitation. Impressions: - TEE was followed by a successful cardioversion.  03/12/15: LHC 1. Prox RCA to Dist RCA lesion, 25% stenosed. 2. LM lesion, 75% stenosed. 3. Ost Cx lesion, 99% stenosed. 4. Ost 1st Mrg to 1st Mrg lesion, 75% stenosed. 5. Prox LAD to Mid LAD lesion, 75% stenosed. 6. Ost 1st Diag lesion, 75% stenosed. 7. Ost 2nd Diag to 2nd Diag lesion, 65% stenosed. 8. 3rd Diag lesion, 85% stenosed.    CCS class III angina pectoris and dyspnea.  Calcific coronary artery disease with 75% distal left main, segmental 70-80% mid LAD stenosis, moderate 2 and 3 stenosis, 99% ostial circumflex, 70% proximal obtuse marginal 1, and luminal irregularities throughout a dominant right coronary.  Normal left ventricular systolic function with normal hemodynamics. EF greater than 50%. RECOMMENDATIONS:  Elderly but fit a 46-year-old gentleman with severe left main, circumflex, and LAD disease. Class III CCS functional status and progressive over the past 6 weeks.  We'll admit to the hospital, place on IV heparin, get TCTS consult to consider revascularization options.  Recent Labs: 04/29/2019: ALT 20; BUN 14; Creatinine, Ser 0.86; Potassium 5.1; Sodium 134; TSH 0.50 06/20/2019: Hemoglobin  12.8; Platelets 225  04/29/2019: Cholesterol 104; HDL 43.90; LDL Cholesterol 47; Total CHOL/HDL Ratio 2; Triglycerides 65.0; VLDL 13.0   CrCl cannot be calculated (Patient's most recent lab result is older than the maximum 21 days allowed.).   Wt Readings from Last 3 Encounters:  07/14/19 171 lb (77.6 kg)  07/14/19 171 lb 3.2 oz (77.7 kg)  07/01/19 171 lb (77.6 kg)     Other studies reviewed: Additional studies/records reviewed today include: summarized above  ASSESSMENT AND PLAN:  1. Persistent uncontrolled AFib/atypical Aflutter,      s/p AVNode ablation (LA clipping)     *** CHA2DS2Vasc is 4, on Eliquis, appropriately dosed by labs in Sept      2. Bradycardia w/PPM    ***  Intact device function     No programming changes made  3. CAD s/p CABG Feb 2017     *** No anginal complaints, on statin, lipids are monitored and managed with his PMD     No ASA given Eliquis  4. HTN    *** Again, he denies this, reports his BP at home are "perfect", does not want to consider medicines  5. HFpEF     *** No symptoms or exam findings to suggest volume OL  6. He mentions concerns of PVD, with cold feet all of the time     *** Denies calf pain or symptoms of claudication     I offered to examine his feet/pulses, he declined, says he will ask Dr. Jenny Reichmann to take a look     He denies any skin changes, discoloration, or wounds   Disposition: ***  Current medicines are reviewed at length with the patient today.  The patient did not have any concerns regarding medicines.  Venetia Night, PA-C 09/11/2019 10:11 AM     [facility] [address] [place] [address] [phone] (office)  [phone] (fax)

## 2019-09-12 ENCOUNTER — Telehealth: Payer: Self-pay | Admitting: *Deleted

## 2019-09-12 ENCOUNTER — Encounter: Payer: Medicare Other | Admitting: Physician Assistant

## 2019-09-12 NOTE — Telephone Encounter (Signed)
Called and left message on vm on cell addressing appointment today . Patent wife DPR was told that appointment is not needed to day due reason for visit has already been addressed with Dr [name] to remain Taking Eliquis 5 mg twice a day

## 2019-09-15 NOTE — Telephone Encounter (Signed)
Already done aug 4

## 2019-10-05 ENCOUNTER — Telehealth: Payer: Self-pay | Admitting: Internal Medicine

## 2019-10-05 NOTE — Telephone Encounter (Signed)
   1.Medication Requested: atorvastatin (LIPITOR) 10 MG tablet tamsulosin (FLOMAX) 0.4 MG CAPS capsule   2. Pharmacy (Name, [place], City):[facility] [place] [facility]  3. On Med List: yes  4. Last Visit with PCP:   5. Next visit date with PCP:   Agent: Please be advised that RX refills may take up to 3 business days. We ask that you follow-up with your pharmacy.

## 2019-10-07 ENCOUNTER — Other Ambulatory Visit: Payer: Self-pay

## 2019-10-07 MED ORDER — TAMSULOSIN HCL 0.4 MG PO CAPS: 0.4000 mg | ORAL_CAPSULE | Freq: Every day | ORAL | 3 refills | Status: DC

## 2019-10-07 MED ORDER — ATORVASTATIN CALCIUM 10 MG PO TABS: 10.0000 mg | ORAL_TABLET | Freq: Every day | ORAL | 3 refills | Status: DC

## 2019-10-10 ENCOUNTER — Other Ambulatory Visit: Payer: Self-pay | Admitting: Internal Medicine

## 2019-10-11 MED ORDER — HYDROCODONE-ACETAMINOPHEN 10-325 MG PO TABS: 1.0000 | ORAL_TABLET | Freq: Every day | ORAL | 0 refills | Status: DC | PRN

## 2019-10-11 NOTE — Telephone Encounter (Signed)
Done erx 

## 2019-11-10 ENCOUNTER — Encounter: Payer: Self-pay | Admitting: Internal Medicine

## 2019-11-10 ENCOUNTER — Ambulatory Visit (INDEPENDENT_AMBULATORY_CARE_PROVIDER_SITE_OTHER): Payer: Medicare Other | Admitting: Internal Medicine

## 2019-11-10 ENCOUNTER — Other Ambulatory Visit: Payer: Self-pay

## 2019-11-10 ENCOUNTER — Other Ambulatory Visit: Payer: Self-pay | Admitting: Internal Medicine

## 2019-11-10 VITALS — BP 130/80 | HR 72 | Temp 98.1°F | Ht 70.0 in | Wt 168.5 lb

## 2019-11-10 DIAGNOSIS — R195 Other fecal abnormalities: Secondary | ICD-10-CM | POA: Insufficient documentation

## 2019-11-10 DIAGNOSIS — E782 Mixed hyperlipidemia: Secondary | ICD-10-CM | POA: Diagnosis not present

## 2019-11-10 DIAGNOSIS — R2689 Other abnormalities of gait and mobility: Secondary | ICD-10-CM

## 2019-11-10 DIAGNOSIS — Z23 Encounter for immunization: Secondary | ICD-10-CM | POA: Diagnosis not present

## 2019-11-10 DIAGNOSIS — R269 Unspecified abnormalities of gait and mobility: Secondary | ICD-10-CM | POA: Diagnosis not present

## 2019-11-10 DIAGNOSIS — K219 Gastro-esophageal reflux disease without esophagitis: Secondary | ICD-10-CM

## 2019-11-10 DIAGNOSIS — H9193 Unspecified hearing loss, bilateral: Secondary | ICD-10-CM

## 2019-11-10 DIAGNOSIS — R739 Hyperglycemia, unspecified: Secondary | ICD-10-CM | POA: Diagnosis not present

## 2019-11-10 DIAGNOSIS — Z7901 Long term (current) use of anticoagulants: Secondary | ICD-10-CM

## 2019-11-10 LAB — CBC WITH DIFFERENTIAL/PLATELET
Basophils Absolute: 0 10*3/uL (ref 0.0–0.1)
Basophils Relative: 0.2 % (ref 0.0–3.0)
Eosinophils Absolute: 0.1 10*3/uL (ref 0.0–0.7)
Eosinophils Relative: 1.3 % (ref 0.0–5.0)
HCT: 40.3 % (ref 39.0–52.0)
Hemoglobin: 13.5 g/dL (ref 13.0–17.0)
Lymphocytes Relative: 41 % (ref 12.0–46.0)
Lymphs Abs: 2.3 10*3/uL (ref 0.7–4.0)
MCHC: 33.4 g/dL (ref 30.0–36.0)
MCV: 93.4 fl (ref 78.0–100.0)
Monocytes Absolute: 0.6 10*3/uL (ref 0.1–1.0)
Monocytes Relative: 9.7 % (ref 3.0–12.0)
Neutro Abs: 2.7 10*3/uL (ref 1.4–7.7)
Neutrophils Relative %: 47.8 % (ref 43.0–77.0)
Platelets: 228 10*3/uL (ref 150.0–400.0)
RBC: 4.31 Mil/uL (ref 4.22–5.81)
RDW: 15.6 % — ABNORMAL HIGH (ref 11.5–15.5)
WBC: 5.7 10*3/uL (ref 4.0–10.5)

## 2019-11-10 LAB — BASIC METABOLIC PANEL
BUN: 12 mg/dL (ref 6–23)
CO2: 33 mEq/L — ABNORMAL HIGH (ref 19–32)
Calcium: 9.4 mg/dL (ref 8.4–10.5)
Chloride: 98 mEq/L (ref 96–112)
Creatinine, Ser: 0.77 mg/dL (ref 0.40–1.50)
GFR: 80.02 mL/min (ref >60.00)
Glucose, Bld: 109 mg/dL — ABNORMAL HIGH (ref 70–99)
Potassium: 3.9 mEq/L (ref 3.5–5.1)
Sodium: 135 mEq/L (ref 135–145)

## 2019-11-10 LAB — HEPATIC FUNCTION PANEL
ALT: 17 U/L (ref 0–53)
AST: 21 U/L (ref 0–37)
Albumin: 4 g/dL (ref 3.5–5.2)
Alkaline Phosphatase: 92 U/L (ref 39–117)
Bilirubin, Direct: 0.2 mg/dL (ref 0.0–0.3)
Total Bilirubin: 1.1 mg/dL (ref 0.2–1.2)
Total Protein: 7.1 g/dL (ref 6.0–8.3)

## 2019-11-10 LAB — HEMOGLOBIN A1C: Hgb A1c MFr Bld: 6.1 % (ref 4.6–6.5)

## 2019-11-10 MED ORDER — OMEPRAZOLE 40 MG PO CPDR
40.0000 mg | DELAYED_RELEASE_CAPSULE | Freq: Every day | ORAL | 3 refills | Status: DC
Start: 2019-11-10 — End: 2020-01-11

## 2019-11-10 MED ORDER — HYDROCODONE-ACETAMINOPHEN 10-325 MG PO TABS: 1.0000 | ORAL_TABLET | Freq: Every day | ORAL | 0 refills | Status: DC | PRN

## 2019-11-10 NOTE — Progress Notes (Signed)
Subjective:    Patient ID: Jonathan Orozco, male    DOB: 1928-08-01, 86 y.o.   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 Pt denies new neurological symptoms such as new headache, or facial or extremity weakness or numbness, but has worsening gait imbalance recently, no falls.  Pt denies chest pain, increased sob or doe, wheezing, orthopnea, PND, increased LE swelling, palpitations, dizziness or syncope.   Pt denies polydipsia, polyuria,  Pt denies fever, wt loss, night sweats, loss of appetite, or other constitutional symptoms  No other new complaints Past Medical History:  Diagnosis Date  . ABUSE, ALCOHOL, IN REMISSION 04/08/2007  . ALLERGIC RHINITIS 09/29/2006  . Arthritis    "hands" (07/02/2016)  . Atrial fibrillation ([place])   . Atrial fibrillation with RVR ([place]) 07/02/2016  . Atrial flutter ([place]) 03/28/2010   a. s/p prior rfca;  b. recurrent paroxysmal flutter 04/2012;  c. pradaxa initiated 04/2012.  [name] BENIGN PROSTATIC HYPERTROPHY 09/29/2006  . BPPV (benign paroxysmal positional vertigo) 04/08/2007  . CAD (coronary artery disease)    a. LHC 2/17: EF 55-65%, LM 75, pLAD 75, oD1 75, oD2 65, D3 85, oLCx 99, oOM1 75, pRCA 25 >> CABG  . Carotid stenosis    a. Carotid US 2/17:  Bilateral ICA 1-39% ICA  . Chronic lower back pain   . COLONIC POLYPS, HX OF 06/23/2007  . 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 Diverticulosis   . ERECTILE DYSFUNCTION 09/29/2006  . GERD (gastroesophageal reflux disease)   . GLUCOSE INTOLERANCE 03/19/2010  . HYPERLIPIDEMIA 09/29/2006  . HYPERTENSION 09/29/2006  . Long term (current) use of anticoagulants 04/26/2010  . MELANOMA, MALIGNANT, SKIN NOS 09/29/2006   other skin cancers -no further melanoma  . OSTEOARTHROSIS NOS, OTHER [facility] SITE 09/29/2006  . Other specified forms of hearing loss  08/10/2009  . Pneumonia ~ 1969  . Presence of permanent cardiac pacemaker 06/02/2016  . VENTRICULAR TACHYCARDIA 09/29/2006   Past Surgical History:  Procedure Laterality Date  . AV NODE ABLATION  07/02/2016  . AV NODE ABLATION N/A 07/02/2016   Procedure: AV Node Ablation;  Surgeon: Deboraha Sprang, MD;  Location: [place] CV LAB;  Service: Cardiovascular;  Laterality: N/A;  . CARDIAC CATHETERIZATION N/A 03/12/2015   Procedure: Left Heart Cath and Coronary Angiography;  Surgeon: [name], MD;  Location: [place] CV LAB;  Service: Cardiovascular;  Laterality: N/A;  . CARDIOVERSION N/A 12/19/2015   Procedure: CARDIOVERSION;  Surgeon: [name], MD;  Location: [facility] ENDOSCOPY;  Service: Cardiovascular;  Laterality: N/A;  . CARDIOVERSION N/A 03/24/2016   Procedure: CARDIOVERSION;  Surgeon: [name], MD;  Location: [place];  Service: Cardiovascular;  Laterality: N/A;  . CATARACT EXTRACTION W/ INTRAOCULAR LENS  IMPLANT, BILATERAL Bilateral   . CLIPPING OF ATRIAL APPENDAGE N/A 03/14/2015   Procedure: CLIPPING OF ATRIAL APPENDAGE;  Surgeon: [name], MD;  Location: [place];  Service: Open Heart Surgery;  Laterality: N/A;  . COLONOSCOPY WITH PROPOFOL N/A 12/08/2014   Procedure: COLONOSCOPY WITH PROPOFOL;  Surgeon: [name], MD;  Location: [facility] ENDOSCOPY;  Service: Endoscopy;  Laterality: N/A;  . CORONARY ARTERY BYPASS GRAFT N/A 03/14/2015   Procedure: CORONARY ARTERY BYPASS GRAFTING (CABG) x 4 (LIMA to LAD, SVG to DIAGONAL 2, SVG SEQUENTIALLY to OM1 and  OM2);  Surgeon: [name], MD;  Location: [place];  Service: Open Heart Surgery;  Laterality: N/A;  . INCISION AND DRAINAGE N/A 07/03/2016   Procedure: INCISION AND DRAINAGE of CHEST ABSCESS;  Surgeon: [name], MD;  Location: [place];  Service: Thoracic;  Laterality: N/A;  . INSERT / REPLACE / REMOVE PACEMAKER  06/02/2016  . JOINT REPLACEMENT    . KNEE ARTHROSCOPY Left   . MELANOMA EXCISION     "upper  back"  . PACEMAKER IMPLANT N/A 06/02/2016   Procedure: Pacemaker Implant;  Surgeon: Deboraha Sprang, MD;  Location: [place] CV LAB;  Service: Cardiovascular;  Laterality: N/A;  . SHOULDER OPEN ROTATOR CUFF REPAIR Left   . SKIN CANCER EXCISION Right    "cheek; real deep"  . TEE WITHOUT CARDIOVERSION N/A 03/14/2015   Procedure: TRANSESOPHAGEAL ECHOCARDIOGRAM (TEE);  Surgeon: [name], MD;  Location: [place];  Service: Open Heart Surgery;  Laterality: N/A;  . TEE WITHOUT CARDIOVERSION N/A 12/19/2015   Procedure: TRANSESOPHAGEAL ECHOCARDIOGRAM (TEE);  Surgeon: [name], MD;  Location: [place];  Service: Cardiovascular;  Laterality: N/A;  . TEE WITHOUT CARDIOVERSION N/A 03/24/2016   Procedure: TRANSESOPHAGEAL ECHOCARDIOGRAM (TEE);  Surgeon: [name], MD;  Location: [place];  Service: Cardiovascular;  Laterality: N/A;  . TONSILLECTOMY    . TOTAL KNEE ARTHROPLASTY Right     reports that he has quit smoking. His smoking use included cigarettes. He has a 52.00 pack-year smoking history. He has never used smokeless tobacco. He reports that he does not drink alcohol and does not use drugs. family history includes Diabetes in his brother; Esophageal cancer in his father. Allergies  Allergen Reactions  . Amiodarone Hcl Other (See Comments)    Patient reports photosensitivity  . Ace Inhibitors Cough   Current Outpatient Medications on File Prior to Visit  Medication Sig Dispense Refill  . atorvastatin (LIPITOR) 10 MG tablet Take 1 tablet (10 mg total) by mouth daily. 90 tablet 3  . b complex vitamins tablet Take 1 tablet by mouth daily.    [name] doxylamine, Sleep, (UNISOM) 25 MG tablet Take 25 mg by mouth at bedtime as needed for sleep.     [name] ELIQUIS 5 MG TABS tablet Take 5 mg by mouth 2 (two) times daily.    . Multiple Vitamin (MULTIVITAMIN) tablet Take 1 tablet by mouth daily.      [name] omega-3 acid ethyl esters (LOVAZA) 1 g capsule Take by mouth.    . Psyllium (METAMUCIL  PO) Take 15 mLs by mouth 3 (three) times daily as needed (constipation).     . tamsulosin (FLOMAX) 0.4 MG CAPS capsule Take 1 capsule (0.4 mg total) by mouth daily. 90 capsule 3  . triamcinolone (NASACORT) 55 MCG/ACT AERO nasal inhaler Place 2 sprays into the nose daily. 1 Inhaler 12  . fluticasone (FLONASE) 50 MCG/ACT nasal spray Place 2 sprays into both nostrils once for 1 dose. 16 g 0   No current facility-administered medications on file prior to visit.   Review of Systems All otherwise neg per pt    Objective:   Physical Exam BP 130/80 (BP Location: Left Arm, Patient Position: Sitting, Cuff Size: Large)   Pulse 72   Temp 98.1 F (36.7 C) (Oral)   Ht 5\' 10"  (1.778 m)   Wt 168 lb 8 oz (76.4 kg)   SpO2 99%   BMI 24.18 kg/m  VS noted,  Constitutional: Pt appears in NAD HENT: Head: NCAT.  Right Ear: External ear normal.  Left Ear: External ear normal.  Eyes: . Pupils are equal, round, and reactive to light. Conjunctivae and EOM are normal Nose: without d/c or deformity Neck: Neck supple. Gross normal ROM Cardiovascular: Normal rate and regular rhythm.   Pulmonary/Chest: Effort normal and breath sounds without rales or wheezing.  Abd:  Soft, NT, ND, + BS, no organomegaly Neurological: Pt is alert. At baseline orientation, motor grossly intact Skin: Skin is warm. No rashes, other new lesions, no LE edema Psychiatric: Pt behavior is normal without agitation  All otherwise neg per pt Lab Results  Component Value Date   WBC 5.7 11/10/2019   HGB 13.5 11/10/2019   HCT 40.3 11/10/2019   PLT 228.0 11/10/2019   GLUCOSE 109 (H) 11/10/2019   CHOL 104 04/29/2019   TRIG 65.0 04/29/2019   HDL 43.90 04/29/2019   LDLDIRECT 159.2 09/29/2006   LDLCALC 47 04/29/2019   ALT 17 11/10/2019   AST 21 11/10/2019   NA 135 11/10/2019   K 3.9 11/10/2019   CL 98 11/10/2019   CREATININE 0.77 11/10/2019   BUN 12 11/10/2019   CO2 33 (H) 11/10/2019   TSH 0.50 04/29/2019   PSA 3.75 06/12/2016    INR 3.1 (A) 06/01/2018   HGBA1C 6.1 11/10/2019      Assessment & Plan:

## 2019-11-10 NOTE — Patient Instructions (Addendum)
You will be contacted regarding the referral for: physical therapy  Please take all new medication as prescribed - the antacid called omeprazole 40 mg (prilosec 40 mg)  Please continue all other medications as before, and refills have been done if requested.  Please have the pharmacy call with any other refills you may need.  Please continue your efforts at being more active, low cholesterol diet, and weight control.  Please keep your appointments with your specialists as you may have planned  Please go to the LAB at the blood drawing area for the tests to be done  You will be contacted by phone if any changes need to be made immediately.  Otherwise, you will receive a letter about your results with an explanation, but please check with MyChart first.  Please remember to sign up for MyChart if you have not done so, as this will be important to you in the future with finding out test results, communicating by private email, and scheduling acute appointments online when needed.  Please make an Appointment to return in 4 months, or sooner if needed

## 2019-11-12 ENCOUNTER — Encounter: Payer: Self-pay | Admitting: Internal Medicine

## 2019-11-12 DIAGNOSIS — K219 Gastro-esophageal reflux disease without esophagitis: Secondary | ICD-10-CM | POA: Insufficient documentation

## 2019-11-12 NOTE — Assessment & Plan Note (Signed)
Cont eliquis 

## 2019-11-12 NOTE — Assessment & Plan Note (Signed)
stable overall by history and exam, recent data reviewed with pt, and pt to continue medical treatment as before,  to f/u any worsening symptoms or concerns  

## 2019-11-12 NOTE — Assessment & Plan Note (Signed)
Asympt, last colnoscopy 2016 with polyps with dr hung, for cbc and consider f/u dr perry/gi but pt does not want futher procedures

## 2019-11-12 NOTE — Assessment & Plan Note (Signed)
For pT

## 2019-11-12 NOTE — Assessment & Plan Note (Signed)
For restart pPI

## 2019-11-12 NOTE — Assessment & Plan Note (Signed)
Severe, for hearing aids

## 2019-11-12 NOTE — Assessment & Plan Note (Addendum)
For referral PT  I spent 41 minutes in preparing to see the patient by review of recent labs, imaging and procedures, obtaining and reviewing separately obtained history, communicating with the patient and family or caregiver, ordering medications, tests or procedures, and documenting clinical information in the EHR including the differential Dx, treatment, and any further evaluation and other management of gait disorder, imbalance, heme pos stool, hyperglycemia, hld, chronic anticoagulation, gerd, hearaing loss

## 2019-11-21 ENCOUNTER — Ambulatory Visit (INDEPENDENT_AMBULATORY_CARE_PROVIDER_SITE_OTHER): Payer: Medicare Other

## 2019-11-21 DIAGNOSIS — I4891 Unspecified atrial fibrillation: Secondary | ICD-10-CM | POA: Diagnosis not present

## 2019-11-22 ENCOUNTER — Ambulatory Visit: Payer: Medicare Other | Attending: Internal Medicine

## 2019-11-22 ENCOUNTER — Other Ambulatory Visit (HOSPITAL_BASED_OUTPATIENT_CLINIC_OR_DEPARTMENT_OTHER): Payer: Self-pay | Admitting: Internal Medicine

## 2019-11-22 DIAGNOSIS — Z23 Encounter for immunization: Secondary | ICD-10-CM

## 2019-11-22 NOTE — Progress Notes (Signed)
   Covid-19 Vaccination Clinic  Name:  Jonathan Orozco    MRN: [id number] DOB: November 15, 1928  11/22/2019  [name] was observed post Covid-19 immunization for 15 minutes without incident. He was provided with Vaccine Information Sheet and instruction to access the V-Safe system.   [name] was instructed to call 911 with any severe reactions post vaccine: [name] Difficulty breathing  . Swelling of face and throat  . A fast heartbeat  . A bad rash all over body  . Dizziness and weakness

## 2019-11-23 LAB — CUP PACEART REMOTE DEVICE CHECK
Battery Remaining Longevity: 95 mo
Battery Voltage: 3 V
Brady Statistic RV Percent Paced: 99.42 %
Date Time Interrogation Session: 20211020141455
Implantable Lead Implant Date: 20180430
Implantable Lead Location: 753860
Implantable Lead Model: 5076
Implantable Pulse Generator Implant Date: 20180430
Lead Channel Impedance Value: 361 Ohm
Lead Channel Impedance Value: 399 Ohm
Lead Channel Pacing Threshold Amplitude: 0.5 V
Lead Channel Pacing Threshold Pulse Width: 0.4 ms
Lead Channel Sensing Intrinsic Amplitude: 5.75 mV
Lead Channel Sensing Intrinsic Amplitude: 5.75 mV
Lead Channel Setting Pacing Amplitude: 2.5 V
Lead Channel Setting Pacing Pulse Width: 0.4 ms
Lead Channel Setting Sensing Sensitivity: 1.2 mV

## 2019-11-28 NOTE — Progress Notes (Signed)
Remote pacemaker transmission.   

## 2019-11-29 MED FILL — PFIZER-BIONTECH COVID-19 VA: 30 | 1 days supply | Qty: 0 | Fill #0

## 2019-12-05 ENCOUNTER — Ambulatory Visit: Payer: Medicare Other | Admitting: Physical Therapy

## 2019-12-08 ENCOUNTER — Ambulatory Visit: Payer: Medicare Other | Attending: Internal Medicine

## 2019-12-08 ENCOUNTER — Other Ambulatory Visit: Payer: Self-pay

## 2019-12-08 VITALS — HR 74

## 2019-12-08 DIAGNOSIS — M6281 Muscle weakness (generalized): Secondary | ICD-10-CM | POA: Diagnosis present

## 2019-12-08 DIAGNOSIS — R2689 Other abnormalities of gait and mobility: Secondary | ICD-10-CM | POA: Insufficient documentation

## 2019-12-08 DIAGNOSIS — R2681 Unsteadiness on feet: Secondary | ICD-10-CM

## 2019-12-09 NOTE — Therapy (Signed)
[facility] [address] [place], [place], [address] Phone: [phone]   Fax:  [phone]  Physical Therapy Treatment  Patient Details  Name: Jonathan Orozco MRN: [id number] Date of Birth: 25-Sep-1928 Referring Provider (PT): Jonathan Orozco   Encounter Date: 12/08/2019   PT End of Session - 12/08/19 1335    Visit Number 1    Number of Visits 17    Date for PT Re-Evaluation 36/46/80   90 day cert, 60 day poc   Authorization Type [employer] medicare so 10th visit progress note    PT Start Time 1330   pt arrived late   PT Stop Time 1400    PT Time Calculation (min) 30 min    Activity Tolerance Patient tolerated treatment well    Behavior During Therapy Jonathan Orozco for tasks assessed/performed           Past Medical History:  Diagnosis Date  . ABUSE, ALCOHOL, IN REMISSION 04/08/2007  . ALLERGIC RHINITIS 09/29/2006  . Arthritis    "hands" (07/02/2016)  . Atrial fibrillation ([place])   . Atrial fibrillation with RVR ([place]) 07/02/2016  . Atrial flutter ([place]) 03/28/2010   a. s/p prior rfca;  b. recurrent paroxysmal flutter 04/2012;  c. pradaxa initiated 04/2012.  [name] BENIGN PROSTATIC HYPERTROPHY 09/29/2006  . BPPV (benign paroxysmal positional vertigo) 04/08/2007  . CAD (coronary artery disease)    a. LHC 2/17: EF 55-65%, LM 75, pLAD 75, oD1 75, oD2 65, D3 85, oLCx 99, oOM1 75, pRCA 25 >> CABG  . Carotid stenosis    a. Carotid US 2/17:  Bilateral ICA 1-39% ICA  . Chronic lower back pain   . COLONIC POLYPS, HX OF 06/23/2007  . 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 Diverticulosis   . ERECTILE DYSFUNCTION 09/29/2006  . GERD (gastroesophageal reflux disease)   . GLUCOSE INTOLERANCE 03/19/2010  . HYPERLIPIDEMIA 09/29/2006  . HYPERTENSION 09/29/2006  . Long term (current) use of anticoagulants 04/26/2010  . MELANOMA, MALIGNANT, SKIN NOS 09/29/2006   other skin cancers -no further melanoma  . OSTEOARTHROSIS NOS, OTHER [facility] SITE 09/29/2006  . Other  specified forms of hearing loss 08/10/2009  . Pneumonia ~ 1969  . Presence of permanent cardiac pacemaker 06/02/2016  . VENTRICULAR TACHYCARDIA 09/29/2006    Past Surgical History:  Procedure Laterality Date  . AV NODE ABLATION  07/02/2016  . AV NODE ABLATION N/A 07/02/2016   Procedure: AV Node Ablation;  Surgeon: Deboraha Sprang, MD;  Location: [facility];  Service: Cardiovascular;  Laterality: N/A;  . CARDIAC CATHETERIZATION N/A 03/12/2015   Procedure: Left Heart Cath and Coronary Angiography;  Surgeon: [name], MD;  Location: [facility];  Service: Cardiovascular;  Laterality: N/A;  . CARDIOVERSION N/A 12/19/2015   Procedure: CARDIOVERSION;  Surgeon: [name], MD;  Location: [facility];  Service: Cardiovascular;  Laterality: N/A;  . CARDIOVERSION N/A 03/24/2016   Procedure: CARDIOVERSION;  Surgeon: [name], MD;  Location: [facility];  Service: Cardiovascular;  Laterality: N/A;  . CATARACT EXTRACTION W/ INTRAOCULAR LENS  IMPLANT, BILATERAL Bilateral   . CLIPPING OF ATRIAL APPENDAGE N/A 03/14/2015   Procedure: CLIPPING OF ATRIAL APPENDAGE;  Surgeon: [name], MD;  Location: [facility];  Service: Open Heart Surgery;  Laterality: N/A;  . COLONOSCOPY WITH PROPOFOL N/A 12/08/2014   Procedure: COLONOSCOPY WITH PROPOFOL;  Surgeon: [name], MD;  Location: [facility];  Service: Endoscopy;  Laterality: N/A;  . CORONARY ARTERY BYPASS GRAFT N/A  03/14/2015   Procedure: CORONARY ARTERY BYPASS GRAFTING (CABG) x 4 (LIMA to LAD, SVG to DIAGONAL 2, SVG SEQUENTIALLY to OM1 and OM2);  Surgeon: [name], MD;  Location: [facility];  Service: Open Heart Surgery;  Laterality: N/A;  . INCISION AND DRAINAGE N/A 07/03/2016   Procedure: INCISION AND DRAINAGE of CHEST ABSCESS;  Surgeon: [name], MD;  Location: [facility];  Service: Thoracic;  Laterality: N/A;  . INSERT / REPLACE / REMOVE PACEMAKER  06/02/2016  . JOINT REPLACEMENT    . KNEE ARTHROSCOPY Left     . MELANOMA EXCISION     "upper back"  . PACEMAKER IMPLANT N/A 06/02/2016   Procedure: Pacemaker Implant;  Surgeon: Deboraha Sprang, MD;  Location: [facility];  Service: Cardiovascular;  Laterality: N/A;  . SHOULDER OPEN ROTATOR CUFF REPAIR Left   . SKIN CANCER EXCISION Right    "cheek; real deep"  . TEE WITHOUT CARDIOVERSION N/A 03/14/2015   Procedure: TRANSESOPHAGEAL ECHOCARDIOGRAM (TEE);  Surgeon: [name], MD;  Location: [facility];  Service: Open Heart Surgery;  Laterality: N/A;  . TEE WITHOUT CARDIOVERSION N/A 12/19/2015   Procedure: TRANSESOPHAGEAL ECHOCARDIOGRAM (TEE);  Surgeon: [name], MD;  Location: [facility];  Service: Cardiovascular;  Laterality: N/A;  . TEE WITHOUT CARDIOVERSION N/A 03/24/2016   Procedure: TRANSESOPHAGEAL ECHOCARDIOGRAM (TEE);  Surgeon: [name], MD;  Location: [facility];  Service: Cardiovascular;  Laterality: N/A;  . TONSILLECTOMY    . TOTAL KNEE ARTHROPLASTY Right     Vitals:   12/08/19 1336  Pulse: 74  SpO2: 99%     Subjective Assessment - 12/08/19 1331    Subjective Pt was referred for gait disorder and imbalance. He reports that he feels very unsteady walking through any tighter spaces or over anything. Wife reports it has gotten worse the last year or so. Pt has done PT a couple years ago but stopped when COVID started. Pt reports a couple small falls last 6 months when turned quick. Pt was independent with no issues a couple years ago especially after his heart surgery. Pt is hard of hearing with wearing hearing aide on right.    Patient is accompained by: Family member   wife   Pertinent History 86 y.o. male  with history of CAD (03/14/15 CABG  LIMA > LAD, SVG > 2nd Diag and OM 1/2 , also had LAA clipping ), HTN, HFpEF, symptomatic bradycardia w/PPM, uncontrolled rapid AFib, Atypical AFlutter now s/p AVN ablation, RVOT VT intolerant of amiodarone (patient states with sun sensitivity and visual changes), chronic LBP, right  TKR.    Currently in Pain? Yes    Pain Score 0-No pain    Pain Location Back    Aggravating Factors  when more active    Pain Relieving Factors laying down              [facility] PT Assessment - 12/08/19 1339      Assessment   Medical Diagnosis gait disorder, imbalance    Referring Provider (PT) Jonathan Orozco    Onset Date/Surgical Date 11/10/19    Hand Dominance Right      Precautions   Precautions Fall      Balance Screen   Has the patient fallen in the past 6 months Yes    How many times? 2    Has the patient had a decrease in activity level because of a fear of falling?  No    Is the patient reluctant to  leave their home because of a fear of falling?  No      Home Social worker Private residence    Living Arrangements Spouse/significant other    Available Help at Discharge Family    Type of [place] to enter    Entrance Stairs-Number of Steps [place] to live on main level with bedroom/bathroom;Two level    Alternate Level Stairs-Number of Steps 12    Alternate Level Stairs-Rails Right    Home Equipment Walker - 2 wheels;Walker - 4 wheels;Cane - single point;Shower seat;Grab bars - tub/shower      Prior Function   Level of Independence Independent   prior to 2 years ago   Vocation Retired    Leisure travel      Cognition   Overall Cognitive Status Impaired/Different from baseline    Area of Impairment Memory    Memory Decreased short-term memory      Observation/Other Assessments   Observations hard of hearing, wears glasses to read. Wife has been driving more lately      Sensation   Light Touch Appears Intact      ROM / Strength   AROM / PROM / Strength Strength      Strength   Strength Assessment Site Shoulder;Elbow;Hand;Hip;Knee;Ankle    Right/Left Shoulder Right;Left    Right Shoulder Flexion 4+/5    Left Shoulder Flexion 4+/5    Right/Left Elbow Right;Left     Right Elbow Flexion 4+/5    Left Elbow Flexion 4+/5    Right/Left hand Right;Left    Right Hand Gross Grasp Functional    Left Hand Gross Grasp Functional    Right/Left Hip Right;Left    Right Hip Flexion 4+/5    Right Hip ABduction 4+/5    Left Hip Flexion 4+/5    Left Hip ABduction 4+/5    Right/Left Knee Right;Left    Right Knee Flexion 4/5    Right Knee Extension 4/5    Left Knee Flexion 4+/5    Left Knee Extension 5/5    Right/Left Ankle Right;Left    Right Ankle Dorsiflexion 4+/5    Left Ankle Dorsiflexion 4+/5      Transfers   Transfers Sit to Stand;Stand to Sit    Sit to Stand 5: Supervision    Five time sit to stand comments  10.51 sec from chair without hands    Stand to Sit 5: Supervision      Ambulation/Gait   Ambulation/Gait Yes    Ambulation/Gait Assistance 5: Supervision;4: Min guard    Ambulation/Gait Assistance Details unsteady at times especially with turning    Ambulation Distance (Feet) 100 Feet    Assistive device None    Gait Pattern Step-through pattern;Decreased step length - right;Decreased step length - left;Trunk flexed    Ambulation Surface Level;Indoor    Gait velocity 10.15 sec=0.41m/s    Stairs Yes    Stairs Assistance 5: Supervision    Stair Management Technique Two rails;Alternating pattern    Number of Stairs 4      Standardized Balance Assessment   Standardized Balance Assessment Timed Up and Go Test      Timed Up and Go Test   TUG Normal TUG    Normal TUG (seconds) 10.81  PT Education - 12/09/19 0733    Education Details Pt was educated on PT plan of care.    Person(s) Educated Patient;Spouse    Methods Explanation    Comprehension Verbalized understanding            PT Short Term Goals - 12/09/19 0759      PT SHORT TERM GOAL #1   Title Pt will be independent with initial HEP for balance and fall prevention.    Time 4    Period Weeks    Status New    Target Date  01/08/20      PT SHORT TERM GOAL #2   Title Pt will be able to ambulate >500' on level surfaces independently for improved household and community mobility.    Time 4    Period Weeks    Status New    Target Date 01/08/20      PT SHORT TERM GOAL #3   Title FGA will be assessed and LTG written.    Time 4    Period Weeks    Status New    Target Date 01/08/20             PT Long Term Goals - 12/09/19 0801      PT LONG TERM GOAL #1   Title Pt will be independent with progressive HEP for balance to continue gains on own.    Time 8    Period Weeks    Status New    Target Date 02/07/20      PT LONG TERM GOAL #2   Title FGA goal TBD.    Time 4    Period Weeks    Status New    Target Date 02/07/20      PT LONG TERM GOAL #3   Title Pt will ambulate >1000' on varied surfaces including sidewalk/grass/around obstacles independently for improved community mobility.    Time 8    Period Weeks    Status New    Target Date 02/07/20      PT LONG TERM GOAL #4   Title Pt will increase gait speed to >1.46m/s for improved gait safety.    Baseline 12/08/19 0.14m/s    Time 8    Period Weeks    Status New    Target Date 02/07/20                 Plan - 12/09/19 0735    Clinical Impression Statement Pt is 86 y/o male referred for gait disorder and imbalance. Balance has gotten progressively worse the last year or 2. Does have extensive cardiac history with pacemaker and CABG. Reports was doing well after CABG 4 years ago. Pt's strength is groslly 4+/5. His TUG score of 10.81 sec and 5 x sit to stnd of 10.51 sec indicate lower fall risk but pt was unsteady with dynamic gait activities. He has impaired with gait with shorter, shufled steps at times. Gait speed of 0.20m/s is safe for community ambulator but decreased compared to norms. PT was limited due to time contraints as pt running behind to visit. Will perform more dynamic gait/ balance testing next session to further assess with  Functional Gait Assessment. Pt will benefit from skilled PT to address balance and gait deficits to improve safety and independence.    Personal Factors and Comorbidities Comorbidity 3+    Comorbidities CAD (03/14/15 CABG  LIMA > LAD, SVG > 2nd Diag and OM 1/2 , also had LAA clipping ), HTN, HFpEF,  symptomatic bradycardia w/PPM, uncontrolled rapid AFib, Atypical AFlutter now s/p AVN ablation, RVOT VT intolerant of amiodarone (patient states with sun sensitivity and visual changes), chronic LBP    Examination-Activity Limitations Locomotion Level;Transfers;Stairs    Examination-Participation Restrictions Community Activity;Yard Work    [employer] Evolving/Moderate complexity    Clinical Decision Making Moderate    Rehab Potential Good    PT Frequency 2x / week   plus eval   PT Duration 8 weeks    PT Treatment/Interventions ADLs/Self Care Home Management;DME Instruction;Gait training;Stair training;Functional mobility training;Therapeutic activities;Therapeutic exercise;Balance training;Neuromuscular re-education;Manual techniques;Patient/family education;Vestibular    PT Next Visit Plan Assess FGA and update goal. Start balance training with initial HEP.    Consulted and Agree with Plan of Care Patient;Family member/caregiver    Family Member Consulted wife           Patient will benefit from skilled therapeutic intervention in order to improve the following deficits and impairments:  Abnormal gait, Decreased endurance, Decreased strength, Decreased mobility, Decreased balance  Visit Diagnosis: Other abnormalities of gait and mobility  Muscle weakness (generalized)  Unsteadiness on feet     Problem List Patient Active Problem List   Diagnosis Date Noted  . GERD (gastroesophageal reflux disease) 11/12/2019  . Heme positive stool 11/10/2019  . Anticoagulated by anticoagulation treatment 05/31/2019  . Epistaxis 05/31/2019  . Acute gouty arthritis 04/29/2019  .  Left arm swelling 01/05/2018  . IBS (irritable bowel syndrome) 06/30/2017  . Bilateral impacted cerumen 11/11/2016  . Imbalance 11/11/2016  . Encounter for well adult exam with abnormal findings 06/19/2016  . Increased prostate specific antigen (PSA) velocity 06/19/2016  . Gait disorder 01/27/2016  . Chronic anticoagulation 12/18/2015  . Hyperglycemia 06/17/2015  . CAD (coronary artery disease), native coronary artery 03/12/2015  . Atrial fibrillation with rapid ventricular response ([place])   . Solitary pulmonary nodule 05/24/2013  . Anxiety state 05/21/2012  . Chronic low back pain 05/21/2012  . Long term (current) use of anticoagulants 04/24/2012  . Hearing loss of both ears 11/11/2010  . Fatigue 11/16/2009  . VERTIGO 04/08/2007  . MELANOMA, MALIGNANT, SKIN NOS 09/29/2006  . Hyperlipidemia 09/29/2006  . ERECTILE DYSFUNCTION 09/29/2006  . VENTRICULAR TACHYCARDIA 09/29/2006  . BENIGN PROSTATIC HYPERTROPHY 09/29/2006  . OSTEOARTHROSIS NOS, OTHER [facility] SITE 09/29/2006    Electa Sniff, PT, DPT, NCS 12/09/2019, 8:06 AM  [facility] [address] [place], [place], [address] Phone: [phone]   Fax:  [phone]  Name: Jonathan Orozco MRN: [id number] Date of Birth: Nov 05, 1928

## 2019-12-12 ENCOUNTER — Other Ambulatory Visit: Payer: Self-pay | Admitting: Internal Medicine

## 2019-12-12 MED ORDER — HYDROCODONE-ACETAMINOPHEN 10-325 MG PO TABS: 1.0000 | ORAL_TABLET | Freq: Every day | ORAL | 0 refills | Status: DC | PRN

## 2019-12-13 ENCOUNTER — Other Ambulatory Visit: Payer: Self-pay

## 2019-12-13 ENCOUNTER — Ambulatory Visit: Payer: Medicare Other | Admitting: Physical Therapy

## 2019-12-13 DIAGNOSIS — R2689 Other abnormalities of gait and mobility: Secondary | ICD-10-CM | POA: Diagnosis not present

## 2019-12-13 DIAGNOSIS — R2681 Unsteadiness on feet: Secondary | ICD-10-CM

## 2019-12-13 NOTE — Patient Instructions (Signed)
Access Code: [email or web address] URL: [email or web address] Date: 12/13/2019 Prepared by: Mady Haagensen  Exercises Heel Toe Raises with Counter Support - 1 x daily - 5 x weekly - 1-2 sets - 10 reps Side to side weightshift - 1 x daily - 5 x weekly - 1-2 sets - 10 reps Standing Hip Abduction with Counter Support - 1 x daily - 5 x weekly - 1-2 sets - 10 reps

## 2019-12-13 NOTE — Therapy (Signed)
[facility] [address] [place], [place], [address] Phone: [phone]   Fax:  [phone]  Physical Therapy Treatment  Patient Details  Name: Jonathan Orozco MRN: [id number] Date of Birth: January 27, 1929 Referring Provider (PT): [name]   Encounter Date: 12/13/2019   PT End of Session - 12/13/19 2014    Visit Number 2    Number of Visits 17    Date for PT Re-Evaluation 11/30/23   90 day cert, 60 day poc   Authorization Type [facility] so 10th visit progress note    PT Start Time 1102    PT Stop Time 1145    PT Time Calculation (min) 43 min    Activity Tolerance Patient tolerated treatment well    Behavior During Therapy [facility] for tasks assessed/performed           Past Medical History:  Diagnosis Date   ABUSE, ALCOHOL, IN REMISSION 04/08/2007   ALLERGIC RHINITIS 09/29/2006   Arthritis    "hands" (07/02/2016)   Atrial fibrillation ([place])    Atrial fibrillation with RVR ([place]) 07/02/2016   Atrial flutter ([place]) 03/28/2010   a. s/p prior rfca;  b. recurrent paroxysmal flutter 04/2012;  c. pradaxa initiated 04/2012.   BENIGN PROSTATIC HYPERTROPHY 09/29/2006   BPPV (benign paroxysmal positional vertigo) 04/08/2007   CAD (coronary artery disease)    a. LHC 2/17: EF 55-65%, LM 75, pLAD 75, oD1 75, oD2 65, D3 85, oLCx 99, oOM1 75, pRCA 25 >> CABG   Carotid stenosis    a. 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   Diverticulosis    ERECTILE DYSFUNCTION 09/29/2006   GERD (gastroesophageal reflux disease)    GLUCOSE INTOLERANCE 03/19/2010   HYPERLIPIDEMIA 09/29/2006   HYPERTENSION 09/29/2006   Long term (current) use of anticoagulants 04/26/2010   MELANOMA, MALIGNANT, SKIN NOS 09/29/2006   other skin cancers -no further melanoma   OSTEOARTHROSIS NOS, OTHER [facility] SITE 09/29/2006   Other specified forms of  hearing loss 08/10/2009   Pneumonia ~ 1969   Presence of permanent cardiac pacemaker 06/02/2016   VENTRICULAR TACHYCARDIA 09/29/2006    Past Surgical History:  Procedure Laterality Date   AV NODE ABLATION  07/02/2016   AV NODE ABLATION N/A 07/02/2016   Procedure: AV Node Ablation;  Surgeon: Deboraha Sprang, MD;  Location: [facility];  Service: Cardiovascular;  Laterality: N/A;   CARDIAC CATHETERIZATION N/A 03/12/2015   Procedure: Left Heart Cath and Coronary Angiography;  Surgeon: [name], MD;  Location: [facility];  Service: Cardiovascular;  Laterality: N/A;   CARDIOVERSION N/A 12/19/2015   Procedure: CARDIOVERSION;  Surgeon: [name], MD;  Location: [facility] ENDOSCOPY;  Service: Cardiovascular;  Laterality: N/A;   CARDIOVERSION N/A 03/24/2016   Procedure: CARDIOVERSION;  Surgeon: [name], MD;  Location: [facility];  Service: Cardiovascular;  Laterality: N/A;   CATARACT EXTRACTION W/ INTRAOCULAR LENS  IMPLANT, BILATERAL Bilateral    CLIPPING OF ATRIAL APPENDAGE N/A 03/14/2015   Procedure: CLIPPING OF ATRIAL APPENDAGE;  Surgeon: [name], MD;  Location: [facility];  Service: Open Heart Surgery;  Laterality: N/A;   COLONOSCOPY WITH PROPOFOL N/A 12/08/2014   Procedure: COLONOSCOPY WITH PROPOFOL;  Surgeon: [name], MD;  Location: [facility] ENDOSCOPY;  Service: Endoscopy;  Laterality: N/A;   CORONARY ARTERY BYPASS GRAFT N/A 03/14/2015   Procedure:  CORONARY ARTERY BYPASS GRAFTING (CABG) x 4 (LIMA to LAD, SVG to DIAGONAL 2, SVG SEQUENTIALLY to OM1 and OM2);  Surgeon: [name], MD;  Location: [facility];  Service: Open Heart Surgery;  Laterality: N/A;   INCISION AND DRAINAGE N/A 07/03/2016   Procedure: INCISION AND DRAINAGE of CHEST ABSCESS;  Surgeon: [name], MD;  Location: [facility];  Service: Thoracic;  Laterality: N/A;   INSERT / REPLACE / REMOVE PACEMAKER  06/02/2016   JOINT REPLACEMENT     KNEE ARTHROSCOPY Left    MELANOMA  EXCISION     "upper back"   PACEMAKER IMPLANT N/A 06/02/2016   Procedure: Pacemaker Implant;  Surgeon: Deboraha Sprang, MD;  Location: [facility];  Service: Cardiovascular;  Laterality: N/A;   SHOULDER OPEN ROTATOR CUFF REPAIR Left    SKIN CANCER EXCISION Right    "cheek; real deep"   TEE WITHOUT CARDIOVERSION N/A 03/14/2015   Procedure: TRANSESOPHAGEAL ECHOCARDIOGRAM (TEE);  Surgeon: [name], MD;  Location: [facility];  Service: Open Heart Surgery;  Laterality: N/A;   TEE WITHOUT CARDIOVERSION N/A 12/19/2015   Procedure: TRANSESOPHAGEAL ECHOCARDIOGRAM (TEE);  Surgeon: [name], MD;  Location: [facility];  Service: Cardiovascular;  Laterality: N/A;   TEE WITHOUT CARDIOVERSION N/A 03/24/2016   Procedure: TRANSESOPHAGEAL ECHOCARDIOGRAM (TEE);  Surgeon: [name], MD;  [facility] ENDOSCOPY;  Service: Cardiovascular;  Laterality: N/A;   TONSILLECTOMY     TOTAL KNEE ARTHROPLASTY Right     There were no vitals filed for this visit.   Subjective Assessment - 12/13/19 1107    Subjective No falls, no changes.    Patient is accompained by: Family member   wife   Pertinent History 86 y.o. male  with history of CAD (03/14/15 CABG  LIMA > LAD, SVG > 2nd Diag and OM 1/2 , also had LAA clipping ), HTN, HFpEF, symptomatic bradycardia w/PPM, uncontrolled rapid AFib, Atypical AFlutter now s/p AVN ablation, RVOT VT intolerant of amiodarone (patient states with sun sensitivity and visual changes), chronic LBP, right TKR.    Currently in Pain? No/denies              [facility] PT Assessment - 12/13/19 0001      High Level Balance   High Level Balance Comments SLS: <1 sec each leg first trial, then after multiple attempts 5 sec each leg, with increased sway and PT close supervision.      Functional Gait  Assessment   Gait assessed  Yes    Gait Level Surface Walks 20 ft in less than 7 sec but greater than 5.5 sec, uses assistive device, slower speed, mild gait deviations,  or deviates 6-10 in outside of the 12 in walkway width.    Change in Gait Speed Able to change speed, demonstrates mild gait deviations, deviates 6-10 in outside of the 12 in walkway width, or no gait deviations, unable to achieve a major change in velocity, or uses a change in velocity, or uses an assistive device.    Gait with Horizontal Head Turns Performs head turns smoothly with slight change in gait velocity (eg, minor disruption to smooth gait path), deviates 6-10 in outside 12 in walkway width, or uses an assistive device.   9.3   Gait with Vertical Head Turns Performs task with moderate change in gait velocity, slows down, deviates 10-15 in outside 12 in walkway width but recovers, can continue to walk.   12.88   Gait and Pivot Turn Turns  slowly, requires verbal cueing, or requires several small steps to catch balance following turn and stop   2 sec narrow BOS   Step Over Obstacle Is able to step over one shoe box (4.5 in total height) but must slow down and adjust steps to clear box safely. May require verbal cueing.    Gait with Narrow Base of Support Ambulates 4-7 steps.    Gait with Eyes Closed Cannot walk 20 ft without assistance, severe gait deviations or imbalance, deviates greater than 15 in outside 12 in walkway width or will not attempt task.    Ambulating Backwards Walks 20 ft, slow speed, abnormal gait pattern, evidence for imbalance, deviates 10-15 in outside 12 in walkway width.   24.25   Steps Alternating feet, must use rail.    Total Score 13    FGA comment: Scores <22/30 indicate increased fall risk.  PT has close supervision with pt/gait belt throughout FGA testing.                         [facility] Adult PT Treatment/Exercise - 12/13/19 0001      Transfers   Transfers Sit to Stand;Stand to Sit    Sit to Stand 5: Supervision;From bed    Sit to Stand Details Verbal cues for technique;Verbal cues for sequencing    Sit to Stand Details (indicate cue type and  reason) Cues for increased forward lean to initiate transfer, then cues for upright posture upon standing.    Stand to Sit 5: Supervision;To bed    Number of Reps 10 reps      Self-Care   Self-Care Other Self-Care Comments    Other Self-Care Comments  Discussed results of FGA, indicating high fall risk; discussed possibility of using cane or walker (he has cane and rolling walker at home); he does not feel a cane would help but is open to using RW.  Educated wife to supervise pt's HEP, initiated today.               Balance Exercises - 12/13/19 0001      Balance Exercises: Standing   Marching Solid surface;Upper extremity assist 1;Static;10 reps;Limitations    Marching Limitations Cues to widen BOS    Heel Raises Both;10 reps    Toe Raise Both;10 reps    Other Standing Exercises Forward hip kicks x 10 reps, back hip kicks x 10 reps, side hip kicks x 10 reps.  Wide BOS lateral weigthshifting x 10 reps with UE support.    Other Standing Exercises Comments With forward hip kicks and marching, pt has posterior lean.              PT Education - 12/13/19 2014    Education Details Initiated HEP    Person(s) Educated Patient;Spouse    Methods Explanation;Demonstration;Handout    Comprehension Verbalized understanding;Returned demonstration;Verbal cues required            PT Short Term Goals - 12/13/19 2018      PT SHORT TERM GOAL #1   Title Pt will be independent with initial HEP for balance and fall prevention.    Time 4    Period Weeks    Status New    Target Date 01/08/20      PT SHORT TERM GOAL #2   Title Pt will be able to ambulate >500' on level surfaces independently for improved household and community mobility.    Time 4    Period Weeks  Status New    Target Date 01/08/20      PT SHORT TERM GOAL #3   Title FGA will be assessed and LTG written.    Baseline FGA score 12/13/2019 13/24    Time 4    Period Weeks    Status Achieved    Target Date 01/08/20              PT Long Term Goals - 12/13/19 2018      PT LONG TERM GOAL #1   Title Pt will be independent with progressive HEP for balance to continue gains on own.    Time 8    Period Weeks    Status New      PT LONG TERM GOAL #2   Title Pt will improve FGA to at least 18/30 for decreased fall risk.    Baseline 13/30    Time 8    Period Weeks    Status New      PT LONG TERM GOAL #3   Title Pt will ambulate >1000' on varied surfaces including sidewalk/grass/around obstacles independently for improved community mobility.    Time 8    Period Weeks    Status New      PT LONG TERM GOAL #4   Title Pt will increase gait speed to >1.39m/s for improved gait safety.    Baseline 12/08/19 0.40m/s    Time 8    Period Weeks    Status New                 Plan - 12/13/19 2015    Clinical Impression Statement FGA completed today with score of 13/24, with pt having shuffling steps at times and posterior lean/near loss of balance at times wtih standing exercises.  He is at high fall risk per FGA, and PT wonders if gait training with rollator/RW may be helpful for improved stability with gait.  HEP initiated this visit, with education to wife to supervise patient during standing exercises.    Personal Factors and Comorbidities Comorbidity 3+    Comorbidities CAD (03/14/15 CABG  LIMA > LAD, SVG > 2nd Diag and OM 1/2 , also had LAA clipping ), HTN, HFpEF, symptomatic bradycardia w/PPM, uncontrolled rapid AFib, Atypical AFlutter now s/p AVN ablation, RVOT VT intolerant of amiodarone (patient states with sun sensitivity and visual changes), chronic LBP    Examination-Activity Limitations Locomotion Level;Transfers;Stairs    Examination-Participation Restrictions Community Activity;Yard Work    [employer] Evolving/Moderate complexity    Rehab Potential Good    PT Frequency 2x / week   plus eval   PT Duration 8 weeks    PT Treatment/Interventions ADLs/Self Care Home  Management;DME Instruction;Gait training;Stair training;Functional mobility training;Therapeutic activities;Therapeutic exercise;Balance training;Neuromuscular re-education;Manual techniques;Patient/family education;Vestibular    PT Next Visit Plan Review initial HEP and add to as appropriate.  Consider gait training with rollator/RW.    Consulted and Agree with Plan of Care Patient;Family member/caregiver    Family Member Consulted wife           Patient will benefit from skilled therapeutic intervention in order to improve the following deficits and impairments:  Abnormal gait, Decreased endurance, Decreased strength, Decreased mobility, Decreased balance  Visit Diagnosis: Other abnormalities of gait and mobility  Unsteadiness on feet     Problem List Patient Active Problem List   Diagnosis Date Noted   GERD (gastroesophageal reflux disease) 11/12/2019   Heme positive stool 11/10/2019   Anticoagulated by anticoagulation treatment 05/31/2019  Epistaxis 05/31/2019   Acute gouty arthritis 04/29/2019   Left arm swelling 01/05/2018   IBS (irritable bowel syndrome) 06/30/2017   Bilateral impacted cerumen 11/11/2016   Imbalance 11/11/2016   Encounter for well adult exam with abnormal findings 06/19/2016   Increased prostate specific antigen (PSA) velocity 06/19/2016   Gait disorder 01/27/2016   Chronic anticoagulation 12/18/2015   Hyperglycemia 06/17/2015   CAD (coronary artery disease), native coronary artery 03/12/2015   Atrial fibrillation with rapid ventricular response ([place])    Solitary pulmonary nodule 05/24/2013   Anxiety state 05/21/2012   Chronic low back pain 05/21/2012   Long term (current) use of anticoagulants 04/24/2012   Hearing loss of both ears 11/11/2010   Fatigue 11/16/2009   VERTIGO 04/08/2007   MELANOMA, MALIGNANT, SKIN NOS 09/29/2006   Hyperlipidemia 09/29/2006   ERECTILE DYSFUNCTION 09/29/2006   VENTRICULAR TACHYCARDIA  09/29/2006   BENIGN PROSTATIC HYPERTROPHY 09/29/2006   OSTEOARTHROSIS NOS, OTHER SPEC SITE 09/29/2006    Bev Drennen W. 12/13/2019, 8:22 PM Frazier Butt., PT  [facility] [address] [place], [place], [address] Phone: [phone]   Fax:  [phone]  Name: Jonathan Orozco MRN: [id number] Date of Birth: May 22, 1928

## 2019-12-15 ENCOUNTER — Ambulatory Visit: Payer: Medicare Other

## 2019-12-15 ENCOUNTER — Other Ambulatory Visit: Payer: Self-pay

## 2019-12-15 DIAGNOSIS — R2689 Other abnormalities of gait and mobility: Secondary | ICD-10-CM

## 2019-12-15 DIAGNOSIS — R2681 Unsteadiness on feet: Secondary | ICD-10-CM

## 2019-12-15 DIAGNOSIS — M6281 Muscle weakness (generalized): Secondary | ICD-10-CM

## 2019-12-15 NOTE — Therapy (Signed)
[facility] [address] [place], [place], [address] Phone: [phone]   Fax:  [phone]  Physical Therapy Treatment  Patient Details  Name: Jonathan Orozco MRN: [id number] Date of Birth: [birth year] Referring Provider (PT): [name]   Encounter Date: 12/15/2019   PT End of Session - 12/15/19 1405    Visit Number 3    Number of Visits 17    Date for PT Re-Evaluation 37/16/96   90 day cert, 60 day poc   Authorization Type [employer] medicare so 10th visit progress note    PT Start Time 1400    PT Stop Time 1447    PT Time Calculation (min) 47 min    Equipment Utilized During Treatment Gait belt    Activity Tolerance Patient tolerated treatment well    Behavior During Therapy [facility] for tasks assessed/performed           Past Medical History:  Diagnosis Date  . ABUSE, ALCOHOL, IN REMISSION 04/08/2007  . ALLERGIC RHINITIS 09/29/2006  . Arthritis    "hands" (07/02/2016)  . Atrial fibrillation ([place])   . Atrial fibrillation with RVR ([place]) 07/02/2016  . Atrial flutter ([place]) 03/28/2010   a. s/p prior rfca;  b. recurrent paroxysmal flutter 04/2012;  c. pradaxa initiated 04/2012.  [name] BENIGN PROSTATIC HYPERTROPHY 09/29/2006  . BPPV (benign paroxysmal positional vertigo) 04/08/2007  . CAD (coronary artery disease)    a. LHC 2/17: EF 55-65%, LM 75, pLAD 75, oD1 75, oD2 65, D3 85, oLCx 99, oOM1 75, pRCA 25 >> CABG  . Carotid stenosis    a. Carotid US 2/17:  Bilateral ICA 1-39% ICA  . Chronic lower back pain   . COLONIC POLYPS, HX OF 06/23/2007  . 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 Diverticulosis   . ERECTILE DYSFUNCTION 09/29/2006  . GERD (gastroesophageal reflux disease)   . GLUCOSE INTOLERANCE 03/19/2010  . HYPERLIPIDEMIA 09/29/2006  . HYPERTENSION 09/29/2006  . Long term (current) use of anticoagulants 04/26/2010  . MELANOMA, MALIGNANT, SKIN NOS 09/29/2006   other skin cancers -no further melanoma  . OSTEOARTHROSIS NOS, OTHER  [facility] SITE 09/29/2006  . Other specified forms of hearing loss 08/10/2009  . Pneumonia ~ 1969  . Presence of permanent cardiac pacemaker 06/02/2016  . VENTRICULAR TACHYCARDIA 09/29/2006    Past Surgical History:  Procedure Laterality Date  . AV NODE ABLATION  07/02/2016  . AV NODE ABLATION N/A 07/02/2016   Procedure: AV Node Ablation;  Surgeon: Deboraha Sprang, MD;  Location: [place] [facility];  Service: Cardiovascular;  Laterality: N/A;  . CARDIAC CATHETERIZATION N/A 03/12/2015   Procedure: Left Heart Cath and Coronary Angiography;  Surgeon: [name], MD;  Location: [place] [facility];  Service: Cardiovascular;  Laterality: N/A;  . CARDIOVERSION N/A 12/19/2015   Procedure: CARDIOVERSION;  Surgeon: [name], MD;  Location: [facility] ENDOSCOPY;  Service: Cardiovascular;  Laterality: N/A;  . CARDIOVERSION N/A 03/24/2016   Procedure: CARDIOVERSION;  Surgeon: [name], MD;  Location: [place];  Service: Cardiovascular;  Laterality: N/A;  . CATARACT EXTRACTION W/ INTRAOCULAR LENS  IMPLANT, BILATERAL Bilateral   . CLIPPING OF ATRIAL APPENDAGE N/A 03/14/2015   Procedure: CLIPPING OF ATRIAL APPENDAGE;  Surgeon: [name], MD;  Location: [place];  Service: Open Heart Surgery;  Laterality: N/A;  . COLONOSCOPY WITH PROPOFOL N/A 12/08/2014   Procedure: COLONOSCOPY WITH PROPOFOL;  Surgeon: [name], MD;  Location: [facility] ENDOSCOPY;  Service: Endoscopy;  Laterality: N/A;  .  CORONARY ARTERY BYPASS GRAFT N/A 03/14/2015   Procedure: CORONARY ARTERY BYPASS GRAFTING (CABG) x 4 (LIMA to LAD, SVG to DIAGONAL 2, SVG SEQUENTIALLY to OM1 and OM2);  Surgeon: [name], MD;  Location: [place];  Service: Open Heart Surgery;  Laterality: N/A;  . INCISION AND DRAINAGE N/A 07/03/2016   Procedure: INCISION AND DRAINAGE of CHEST ABSCESS;  Surgeon: [name], MD;  Location: [place];  Service: Thoracic;  Laterality: N/A;  . INSERT / REPLACE / REMOVE PACEMAKER  06/02/2016  . JOINT REPLACEMENT     . KNEE ARTHROSCOPY Left   . MELANOMA EXCISION     "upper back"  . PACEMAKER IMPLANT N/A 06/02/2016   Procedure: Pacemaker Implant;  Surgeon: Deboraha Sprang, MD;  Location: [place] [facility];  Service: Cardiovascular;  Laterality: N/A;  . SHOULDER OPEN ROTATOR CUFF REPAIR Left   . SKIN CANCER EXCISION Right    "cheek; real deep"  . TEE WITHOUT CARDIOVERSION N/A 03/14/2015   Procedure: TRANSESOPHAGEAL ECHOCARDIOGRAM (TEE);  Surgeon: [name], MD;  Location: [place];  Service: Open Heart Surgery;  Laterality: N/A;  . TEE WITHOUT CARDIOVERSION N/A 12/19/2015   Procedure: TRANSESOPHAGEAL ECHOCARDIOGRAM (TEE);  Surgeon: [name], MD;  Location: [place];  Service: Cardiovascular;  Laterality: N/A;  . TEE WITHOUT CARDIOVERSION N/A 03/24/2016   Procedure: TRANSESOPHAGEAL ECHOCARDIOGRAM (TEE);  Surgeon: [name], MD;  Location: [place];  Service: Cardiovascular;  Laterality: N/A;  . TONSILLECTOMY    . TOTAL KNEE ARTHROPLASTY Right     There were no vitals filed for this visit.   Subjective Assessment - 12/15/19 1405    Subjective Pt reports that he was told that he will have stop eliquis and switch to coumadin the beginning of the year. He is not happy about that idea.    Patient is accompained by: Family member   wife   Pertinent History 86 y.o. male  with history of CAD (03/14/15 CABG  LIMA > LAD, SVG > 2nd Diag and OM 1/2 , also had LAA clipping ), HTN, HFpEF, symptomatic bradycardia w/PPM, uncontrolled rapid AFib, Atypical AFlutter now s/p AVN ablation, RVOT VT intolerant of amiodarone (patient states with sun sensitivity and visual changes), chronic LBP, right TKR.    Currently in Pain? No/denies                             [facility] Adult PT Treatment/Exercise - 12/15/19 1407      Transfers   Transfers Sit to Stand;Stand to Sit    Sit to Stand 5: Supervision    Stand to Sit 5: Supervision      Ambulation/Gait   Ambulation/Gait Yes     Ambulation/Gait Assistance 5: Supervision    Ambulation/Gait Assistance Details PT trialed gait without AD with pt having decreased stability at times and decreased step length. Then trialed RW which pt did not like and it slowed him down. Last bout with rollator with improved cadence. Verbal cues to stay up tall and keep rollator close. He was steadier with rollator. HR=86 after gait. Pt reports that he notices he tires out quicker than he used to especially when walking outside on hill.     Ambulation Distance (Feet) 345 Feet   115' x 1 with RW, 230' x 1 with rollator.   Assistive device None;Rolling walker;4-wheeled walker    Gait Pattern Step-through pattern;Decreased step length - right;Decreased step length - left  Ambulation Surface Level;Indoor      Neuro Re-ed    Neuro Re-ed Details  Sit to stand x 5 from mat without hands then added in airex under feet 5 x 2. Pt was more challenged and needed multiple attempts to rise at times. Cued to lean forward to help. Standing on airex: feet apart eyes open x 30 sec, eyes closed x 30 sec CGA/min assist. Pt tends to pitch forward some. Marching in place on airex x 10 with 2 HHA. Pt had a couple LOB and was cued to slow down and try to control more. Min assist to correct LOB. Standing on airex with head turns left/right and up/down x 5 each. In corner with chair in front for safety: standing with head turns left/right x 10 and up/down x 10, then eyes closed x 30 sec.       Exercises   Exercises Other Exercises    Other Exercises  Standing raising up on toes/back on heels x 10, hip abd x 10 with verbal cues for form.                  PT Education - 12/15/19 1552    Education Details Added balance exercises in corner to HEP. Emphasized importance of performing in corner with chair in front for safety.    Person(s) Educated Patient    Methods Explanation;Demonstration;Handout    Comprehension Verbalized understanding;Returned demonstration              PT Short Term Goals - 12/13/19 2018      PT SHORT TERM GOAL #1   Title Pt will be independent with initial HEP for balance and fall prevention.    Time 4    Period Weeks    Status New    Target Date 01/08/20      PT SHORT TERM GOAL #2   Title Pt will be able to ambulate >500' on level surfaces independently for improved household and community mobility.    Time 4    Period Weeks    Status New    Target Date 01/08/20      PT SHORT TERM GOAL #3   Title FGA will be assessed and LTG written.    Baseline FGA score 12/13/2019 13/24    Time 4    Period Weeks    Status Achieved    Target Date 01/08/20             PT Long Term Goals - 12/13/19 2018      PT LONG TERM GOAL #1   Title Pt will be independent with progressive HEP for balance to continue gains on own.    Time 8    Period Weeks    Status New      PT LONG TERM GOAL #2   Title Pt will improve FGA to at least 18/30 for decreased fall risk.    Baseline 13/30    Time 8    Period Weeks    Status New      PT LONG TERM GOAL #3   Title Pt will ambulate >1000' on varied surfaces including sidewalk/grass/around obstacles independently for improved community mobility.    Time 8    Period Weeks    Status New      PT LONG TERM GOAL #4   Title Pt will increase gait speed to >1.20m/s for improved gait safety.    Baseline 12/08/19 0.3m/s    Time 8    Period Weeks  Status New                 Plan - 12/15/19 1552    Clinical Impression Statement PT trialed RW and rollator today with patient. He was safest with rollator. Will continue to assess to determine if needs to use all the time. Pt was really challenged with balance on compliant surface today.    Personal Factors and Comorbidities Comorbidity 3+    Comorbidities CAD (03/14/15 CABG  LIMA > LAD, SVG > 2nd Diag and OM 1/2 , also had LAA clipping ), HTN, HFpEF, symptomatic bradycardia w/PPM, uncontrolled rapid AFib, Atypical AFlutter now s/p AVN  ablation, RVOT VT intolerant of amiodarone (patient states with sun sensitivity and visual changes), chronic LBP    Examination-Activity Limitations Locomotion Level;Transfers;Stairs    Examination-Participation Restrictions Community Activity;Yard Work    [employer] Evolving/Moderate complexity    Rehab Potential Good    PT Frequency 2x / week   plus eval   PT Duration 8 weeks    PT Treatment/Interventions ADLs/Self Care Home Management;DME Instruction;Gait training;Stair training;Functional mobility training;Therapeutic activities;Therapeutic exercise;Balance training;Neuromuscular re-education;Manual techniques;Patient/family education;Vestibular    PT Next Visit Plan Continue gait training without AD and with rollator as was safer than RW. Pt not fan of cane at this time. Continue with balance activities progressing to more compliant surfaces as able. Is current HEP on firm surface in corner going ok?    Consulted and Agree with Plan of Care Patient           Patient will benefit from skilled therapeutic intervention in order to improve the following deficits and impairments:  Abnormal gait, Decreased endurance, Decreased strength, Decreased mobility, Decreased balance  Visit Diagnosis: Other abnormalities of gait and mobility  Muscle weakness (generalized)  Unsteadiness on feet     Problem List Patient Active Problem List   Diagnosis Date Noted  . GERD (gastroesophageal reflux disease) 11/12/2019  . Heme positive stool 11/10/2019  . Anticoagulated by anticoagulation treatment 05/31/2019  . Epistaxis 05/31/2019  . Acute gouty arthritis 04/29/2019  . Left arm swelling 01/05/2018  . IBS (irritable bowel syndrome) 06/30/2017  . Bilateral impacted cerumen 11/11/2016  . Imbalance 11/11/2016  . Encounter for well adult exam with abnormal findings 06/19/2016  . Increased prostate specific antigen (PSA) velocity 06/19/2016  . Gait disorder 01/27/2016  .  Chronic anticoagulation 12/18/2015  . Hyperglycemia 06/17/2015  . CAD (coronary artery disease), native coronary artery 03/12/2015  . Atrial fibrillation with rapid ventricular response ([place])   . Solitary pulmonary nodule 05/24/2013  . Anxiety state 05/21/2012  . Chronic low back pain 05/21/2012  . Long term (current) use of anticoagulants 04/24/2012  . Hearing loss of both ears 11/11/2010  . Fatigue 11/16/2009  . VERTIGO 04/08/2007  . MELANOMA, MALIGNANT, SKIN NOS 09/29/2006  . Hyperlipidemia 09/29/2006  . ERECTILE DYSFUNCTION 09/29/2006  . VENTRICULAR TACHYCARDIA 09/29/2006  . BENIGN PROSTATIC HYPERTROPHY 09/29/2006  . OSTEOARTHROSIS NOS, OTHER [facility] SITE 09/29/2006    Electa Sniff, PT, DPT, NCS 12/15/2019, 3:55 PM  [facility] [address] [place], [place], [address] Phone: [phone]   Fax:  [phone]  Name: Jonathan Orozco MRN: [id number] Date of Birth: 1928/05/25

## 2019-12-15 NOTE — Patient Instructions (Signed)
Access Code: [email or web address] URL: [email or web address] Date: 12/15/2019 Prepared by: Cherly Anderson  Exercises Heel Toe Raises with Counter Support - 1 x daily - 5 x weekly - 1-2 sets - 10 reps Side to side weightshift - 1 x daily - 5 x weekly - 1-2 sets - 10 reps Standing Hip Abduction with Counter Support - 1 x daily - 5 x weekly - 1-2 sets - 10 reps Sit to Stand without Arm Support - 2 x daily - 5 x weekly - 2 sets - 5 reps Standing Balance in Corner with Eyes Closed - 2 x daily - 5 x weekly - 1 sets - 3 reps - 20 sec hold Head turns side to side and up/down - 1 x daily - 7 x weekly - 3 sets - 10 reps

## 2019-12-20 ENCOUNTER — Telehealth: Payer: Self-pay | Admitting: Interventional Cardiology

## 2019-12-20 NOTE — Telephone Encounter (Signed)
Will route to Dr. Irish Lack to see if ok to switch pt to Xarelto.

## 2019-12-20 NOTE — Telephone Encounter (Signed)
Pt c/o medication issue:  1. Name of Medication: ELIQUIS 5 MG TABS tablet  2. How are you currently taking this medication (dosage and times per day)? 1 tablet by mouth twice daily   3. Are you having a reaction (difficulty breathing--STAT)? No   4. What is your medication issue? Barnetta Chapel is calling stating Jonathan Orozco's insurance will no longer cover his Eliquis, but advised him to start taking Xarelto instead. She states they advised he could take it the same way as the Eliquis and it would cost him the same as his Eliquis when covered by insurance. They are requesting a prescription be sent to [facility] if Dr. Irish Lack is in agreement with this switch. Please advise.

## 2019-12-21 ENCOUNTER — Ambulatory Visit: Payer: Medicare Other

## 2019-12-21 ENCOUNTER — Other Ambulatory Visit: Payer: Self-pay | Admitting: Pharmacist

## 2019-12-21 ENCOUNTER — Other Ambulatory Visit: Payer: Self-pay

## 2019-12-21 DIAGNOSIS — R2689 Other abnormalities of gait and mobility: Secondary | ICD-10-CM

## 2019-12-21 DIAGNOSIS — R2681 Unsteadiness on feet: Secondary | ICD-10-CM

## 2019-12-21 DIAGNOSIS — M6281 Muscle weakness (generalized): Secondary | ICD-10-CM

## 2019-12-21 MED ORDER — ELIQUIS 5 MG PO TABS: 5.0000 mg | ORAL_TABLET | Freq: Two times a day (BID) | ORAL | 1 refills | Status: DC

## 2019-12-21 NOTE — Telephone Encounter (Signed)
OK to switch to Xarelto. Would check with PharmD about dosage and timing of first dose after ELiquis. [name]

## 2019-12-21 NOTE — Telephone Encounter (Signed)
Patient's wife notified. Prescription sent to [facility]

## 2019-12-21 NOTE — Therapy (Signed)
[facility] [address] [place], [place], [address] Phone: [phone]   Fax:  [phone]  Physical Therapy Treatment  Patient Details  Name: Jonathan Orozco MRN: [id number] Date of Birth: Sep 01, 1928 Referring Provider (PT): [name]   Encounter Date: 12/21/2019   PT End of Session - 12/21/19 1753    Visit Number 4    Number of Visits 17    Date for PT Re-Evaluation 78/24/23   90 day cert, 60 day poc   Authorization Type [employer] medicare so 10th visit progress note    PT Start Time 1616    PT Stop Time 1702    PT Time Calculation (min) 46 min    Equipment Utilized During Treatment Gait belt    Activity Tolerance Patient tolerated treatment well    Behavior During Therapy WFL for tasks assessed/performed           Past Medical History:  Diagnosis Date  . ABUSE, ALCOHOL, IN REMISSION 04/08/2007  . ALLERGIC RHINITIS 09/29/2006  . Arthritis    "hands" (07/02/2016)  . Atrial fibrillation ([place])   . Atrial fibrillation with RVR ([place]) 07/02/2016  . Atrial flutter ([place]) 03/28/2010   a. s/p prior rfca;  b. recurrent paroxysmal flutter 04/2012;  c. pradaxa initiated 04/2012.  [name] BENIGN PROSTATIC HYPERTROPHY 09/29/2006  . BPPV (benign paroxysmal positional vertigo) 04/08/2007  . CAD (coronary artery disease)    a. LHC 2/17: EF 55-65%, LM 75, pLAD 75, oD1 75, oD2 65, D3 85, oLCx 99, oOM1 75, pRCA 25 >> CABG  . Carotid stenosis    a. Carotid US 2/17:  Bilateral ICA 1-39% ICA  . Chronic lower back pain   . COLONIC POLYPS, HX OF 06/23/2007  . 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 Diverticulosis   . ERECTILE DYSFUNCTION 09/29/2006  . GERD (gastroesophageal reflux disease)   . GLUCOSE INTOLERANCE 03/19/2010  . HYPERLIPIDEMIA 09/29/2006  . HYPERTENSION 09/29/2006  . Long term (current) use of anticoagulants 04/26/2010  . MELANOMA, MALIGNANT, SKIN NOS 09/29/2006   other skin cancers -no further melanoma  . OSTEOARTHROSIS NOS, OTHER  [facility] SITE 09/29/2006  . Other specified forms of hearing loss 08/10/2009  . Pneumonia ~ 1969  . Presence of permanent cardiac pacemaker 06/02/2016  . VENTRICULAR TACHYCARDIA 09/29/2006    Past Surgical History:  Procedure Laterality Date  . AV NODE ABLATION  07/02/2016  . AV NODE ABLATION N/A 07/02/2016   Procedure: AV Node Ablation;  Surgeon: Deboraha Sprang, MD;  Location: [facility];  Service: Cardiovascular;  Laterality: N/A;  . CARDIAC CATHETERIZATION N/A 03/12/2015   Procedure: Left Heart Cath and Coronary Angiography;  Surgeon: [name], MD;  Location: [facility] [place] [facility];  Service: Cardiovascular;  Laterality: N/A;  . CARDIOVERSION N/A 12/19/2015   Procedure: CARDIOVERSION;  Surgeon: [name], MD;  Location: [facility] ENDOS[facility];  Service: Cardiovascular;  Laterality: N/A;  . CARDIOVERSION N/A 03/24/2016   Procedure: CARDIOVERSION;  Surgeon: [name], MD;  Location: [facility];  Service: Cardiovascular;  Laterality: N/A;  . CATARACT EXTRACTION W/ INTRAOCULAR LENS  IMPLANT, BILATERAL Bilateral   . CLIPPING OF ATRIAL APPENDAGE N/A 03/14/2015   Procedure: CLIPPING OF ATRIAL APPENDAGE;  Surgeon: [name], MD;  Location: [facility];  Service: Open Heart Surgery;  Laterality: N/A;  . COLONOSCOPY WITH PROPOFOL N/A 12/08/2014   Procedure: COLONOSCOPY WITH PROPOFOL;  Surgeon: [name], MD;  Location: [facility] ENDOSCOPY;  Service: Endoscopy;  Laterality: N/A;  .  CORONARY ARTERY BYPASS GRAFT N/A 03/14/2015   Procedure: CORONARY ARTERY BYPASS GRAFTING (CABG) x 4 (LIMA to LAD, SVG to DIAGONAL 2, SVG SEQUENTIALLY to OM1 and OM2);  Surgeon: [name], MD;  Location: [facility];  Service: Open Heart Surgery;  Laterality: N/A;  . INCISION AND DRAINAGE N/A 07/03/2016   Procedure: INCISION AND DRAINAGE of CHEST ABSCESS;  Surgeon: [name], MD;  Location: [facility];  Service: Thoracic;  Laterality: N/A;  . INSERT / REPLACE / REMOVE PACEMAKER  06/02/2016  . JOINT REPLACEMENT     . KNEE ARTHROSCOPY Left   . MELANOMA EXCISION     "upper back"  . PACEMAKER IMPLANT N/A 06/02/2016   Procedure: Pacemaker Implant;  Surgeon: Deboraha Sprang, MD;  Location: [facility];  Service: Cardiovascular;  Laterality: N/A;  . SHOULDER OPEN ROTATOR CUFF REPAIR Left   . SKIN CANCER EXCISION Right    "cheek; real deep"  . TEE WITHOUT CARDIOVERSION N/A 03/14/2015   Procedure: TRANSESOPHAGEAL ECHOCARDIOGRAM (TEE);  Surgeon: [name], MD;  Location: [facility];  Service: Open Heart Surgery;  Laterality: N/A;  . TEE WITHOUT CARDIOVERSION N/A 12/19/2015   Procedure: TRANSESOPHAGEAL ECHOCARDIOGRAM (TEE);  Surgeon: [name], MD;  Location: [facility];  Service: Cardiovascular;  Laterality: N/A;  . TEE WITHOUT CARDIOVERSION N/A 03/24/2016   Procedure: TRANSESOPHAGEAL ECHOCARDIOGRAM (TEE);  Surgeon: [name], MD;  Location: [facility];  Service: Cardiovascular;  Laterality: N/A;  . TONSILLECTOMY    . TOTAL KNEE ARTHROPLASTY Right     There were no vitals filed for this visit.   Subjective Assessment - 12/21/19 1621    Subjective Pt reports that he has been doing exercises. Pt is having a bad back day. Took 2 [place].    Patient is accompained by: --   wife   Pertinent History 86 y.o. male  with history of CAD (03/14/15 CABG  LIMA > LAD, SVG > 2nd Diag and OM 1/2 , also had LAA clipping ), HTN, HFpEF, symptomatic bradycardia w/PPM, uncontrolled rapid AFib, Atypical AFlutter now s/p AVN ablation, RVOT VT intolerant of amiodarone (patient states with sun sensitivity and visual changes), chronic LBP, right TKR.    Currently in Pain? No/denies                             [facility] Adult PT Treatment/Exercise - 12/21/19 1705      Transfers   Transfers Sit to Stand;Stand to Sit    Sit to Stand 5: Supervision    Stand to Sit 5: Supervision      Ambulation/Gait   Ambulation/Gait Yes    Ambulation/Gait Assistance 5: Supervision;4: Min guard     Ambulation/Gait Assistance Details Pt cued on taking bigger steps (specifically on the RLE) and on maintaining more upright posture.    Ambulation Distance (Feet) 850 Feet    Assistive device 4-wheeled walker;None    Gait Pattern Step-through pattern;Decreased step length - right;Decreased step length - left    Ambulation Surface Outdoor;Unlevel      Neuro Re-ed    Neuro Re-ed Details  In // bars: high marches without UE support with min A for stability. Pt cued to slow down and count to 3 for each knee raise. Pt then instructed in retro walking back with no UE support. with only CGA. Pt complete High marching forward and retro walking back x4 trials. Pt then instructed in airex training including standing with  FT for 1 min, standing with FT and EC x 1 min, standing with FT and EO with alternating shoulder flexion x20 reps, and then forward and backward stepping from airex to 6" step x 20 reps all with CGA-min A with intermittent UE support. Pt then instructed in alternating cone taps while standing on blue airex x 20 reps with no UE support and min A. Pt then instructed in rockerboard training including maintaining balance for 1 min with min A, slow rocking forward and back x 1 min, alternating arm raises x 20 reps, and EC with narrow BOS x 1 min with min A and intermittent UE support on bars.  Pt also instructed in reciprocal stepping over 3 2x4" foam beams in // bars with no UE support with focus on taking bigger steps to fully clear foam beams x 8 trials wit CGA and improved foot clearance throughout.                  PT Education - 12/21/19 1752    Education Details PT reviewed HEP with pt and encouraged pt to continue consistently.    Person(s) Educated Patient    Methods Explanation;Demonstration    Comprehension Verbalized understanding            PT Short Term Goals - 12/13/19 2018      PT SHORT TERM GOAL #1   Title Pt will be independent with initial HEP for balance and fall  prevention.    Time 4    Period Weeks    Status New    Target Date 01/08/20      PT SHORT TERM GOAL #2   Title Pt will be able to ambulate >500' on level surfaces independently for improved household and community mobility.    Time 4    Period Weeks    Status New    Target Date 01/08/20      PT SHORT TERM GOAL #3   Title FGA will be assessed and LTG written.    Baseline FGA score 12/13/2019 13/24    Time 4    Period Weeks    Status Achieved    Target Date 01/08/20             PT Long Term Goals - 12/13/19 2018      PT LONG TERM GOAL #1   Title Pt will be independent with progressive HEP for balance to continue gains on own.    Time 8    Period Weeks    Status New      PT LONG TERM GOAL #2   Title Pt will improve FGA to at least 18/30 for decreased fall risk.    Baseline 13/30    Time 8    Period Weeks    Status New      PT LONG TERM GOAL #3   Title Pt will ambulate >1000' on varied surfaces including sidewalk/grass/around obstacles independently for improved community mobility.    Time 8    Period Weeks    Status New      PT LONG TERM GOAL #4   Title Pt will increase gait speed to >1.5m/s for improved gait safety.    Baseline 12/08/19 0.75m/s    Time 8    Period Weeks    Status New                 Plan - 12/21/19 1750    Clinical Impression Statement Today's skilled PT session focused on outdoor  gait training with rollator vs. no AD and dynamic balance on compliant surfaces as well as reciprocal stepping over obstacles for increasing bilateral step length and improving foot clearance. Pt is very impulsive with balance activities and tends ot take short, choppy steps without AD. Pt needs verbal cues to slow down and focus on proper mechanics. Pt with very positive attitude and very motivated. PT to continue to challenge dynamic balance and improve gait mechanics.    Personal Factors and Comorbidities Comorbidity 3+    Comorbidities CAD (03/14/15 CABG   LIMA > LAD, SVG > 2nd Diag and OM 1/2 , also had LAA clipping ), HTN, HFpEF, symptomatic bradycardia w/PPM, uncontrolled rapid AFib, Atypical AFlutter now s/p AVN ablation, RVOT VT intolerant of amiodarone (patient states with sun sensitivity and visual changes), chronic LBP    Examination-Activity Limitations Locomotion Level;Transfers;Stairs    Examination-Participation Restrictions Community Activity;Yard Work    [employer] Evolving/Moderate complexity    Rehab Potential Good    PT Frequency 2x / week   plus eval   PT Duration 8 weeks    PT Treatment/Interventions ADLs/Self Care Home Management;DME Instruction;Gait training;Stair training;Functional mobility training;Therapeutic activities;Therapeutic exercise;Balance training;Neuromuscular re-education;Manual techniques;Patient/family education;Vestibular    PT Next Visit Plan Continue gait training without AD and with rollator. Pt not fan of cane at this time. Continue with balance activities progressing to more compliant surfaces as able. Progress HEP.    Consulted and Agree with Plan of Care Patient           Patient will benefit from skilled therapeutic intervention in order to improve the following deficits and impairments:  Abnormal gait, Decreased endurance, Decreased strength, Decreased mobility, Decreased balance  Visit Diagnosis: Other abnormalities of gait and mobility  Muscle weakness (generalized)  Unsteadiness on feet     Problem List Patient Active Problem List   Diagnosis Date Noted  . GERD (gastroesophageal reflux disease) 11/12/2019  . Heme positive stool 11/10/2019  . Anticoagulated by anticoagulation treatment 05/31/2019  . Epistaxis 05/31/2019  . Acute gouty arthritis 04/29/2019  . Left arm swelling 01/05/2018  . IBS (irritable bowel syndrome) 06/30/2017  . Bilateral impacted cerumen 11/11/2016  . Imbalance 11/11/2016  . Encounter for well adult exam with abnormal findings  06/19/2016  . Increased prostate specific antigen (PSA) velocity 06/19/2016  . Gait disorder 01/27/2016  . Chronic anticoagulation 12/18/2015  . Hyperglycemia 06/17/2015  . CAD (coronary artery disease), native coronary artery 03/12/2015  . Atrial fibrillation with rapid ventricular response ([place])   . Solitary pulmonary nodule 05/24/2013  . Anxiety state 05/21/2012  . Chronic low back pain 05/21/2012  . Long term (current) use of anticoagulants 04/24/2012  . Hearing loss of both ears 11/11/2010  . Fatigue 11/16/2009  . VERTIGO 04/08/2007  . MELANOMA, MALIGNANT, SKIN NOS 09/29/2006  . Hyperlipidemia 09/29/2006  . ERECTILE DYSFUNCTION 09/29/2006  . VENTRICULAR TACHYCARDIA 09/29/2006  . BENIGN PROSTATIC HYPERTROPHY 09/29/2006  . OSTEOARTHROSIS NOS, OTHER [facility] SITE 09/29/2006    Rosalita Levan, SPT 12/21/2019, 5:56 PM  [facility] [address] [place], [place], [address] Phone: [phone]   Fax:  [phone]  Name: Jonathan Orozco MRN: [id number] Date of Birth: 06/07/1928

## 2019-12-21 NOTE — Telephone Encounter (Addendum)
Pt should not need to change to Xarelto - his plan will continue to cover Eliquis for the rest of this year and I'll submit a prior authorization in January to keep on therapy. Eliquis would be preferred for pt over Xarelto due to lower bleeding risk and patient's advanced age.  Triage, please let pt know this and also send in a 90 day Eliquis refill to his preferred pharmacy (this will allow [place] more time in January to work with his insurance).

## 2019-12-23 ENCOUNTER — Other Ambulatory Visit: Payer: Self-pay

## 2019-12-23 ENCOUNTER — Ambulatory Visit: Payer: Medicare Other | Admitting: Physical Therapy

## 2019-12-23 DIAGNOSIS — R2681 Unsteadiness on feet: Secondary | ICD-10-CM

## 2019-12-23 DIAGNOSIS — R2689 Other abnormalities of gait and mobility: Secondary | ICD-10-CM

## 2019-12-23 NOTE — Therapy (Signed)
[facility] [address] [place], [place], [address] Phone: [phone]   Fax:  [phone]  Physical Therapy Treatment  Patient Details  Name: Jonathan Orozco MRN: [id number] Date of Birth: Oct 09, 1928 Referring Provider (PT): [name]   Encounter Date: 12/23/2019   PT End of Session - 12/23/19 1412    Visit Number 5    Number of Visits 17    Date for PT Re-Evaluation 64/40/34   90 day cert, 60 day poc   Authorization Type [employer] medicare so 10th visit progress note    PT Start Time 1321    PT Stop Time 1400    PT Time Calculation (min) 39 min    Equipment Utilized During Treatment Gait belt    Activity Tolerance Patient tolerated treatment well    Behavior During Therapy WFL for tasks assessed/performed           Past Medical History:  Diagnosis Date  . ABUSE, ALCOHOL, IN REMISSION 04/08/2007  . ALLERGIC RHINITIS 09/29/2006  . Arthritis    "hands" (07/02/2016)  . Atrial fibrillation ([place])   . Atrial fibrillation with RVR ([place]) 07/02/2016  . Atrial flutter ([place]) 03/28/2010   a. s/p prior rfca;  b. recurrent paroxysmal flutter 04/2012;  c. pradaxa initiated 04/2012.  [name] BENIGN PROSTATIC HYPERTROPHY 09/29/2006  . BPPV (benign paroxysmal positional vertigo) 04/08/2007  . CAD (coronary artery disease)    a. LHC 2/17: EF 55-65%, LM 75, pLAD 75, oD1 75, oD2 65, D3 85, oLCx 99, oOM1 75, pRCA 25 >> CABG  . Carotid stenosis    a. Carotid US 2/17:  Bilateral ICA 1-39% ICA  . Chronic lower back pain   . COLONIC POLYPS, HX OF 06/23/2007  . 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 Diverticulosis   . ERECTILE DYSFUNCTION 09/29/2006  . GERD (gastroesophageal reflux disease)   . GLUCOSE INTOLERANCE 03/19/2010  . HYPERLIPIDEMIA 09/29/2006  . HYPERTENSION 09/29/2006  . Long term (current) use of anticoagulants 04/26/2010  . MELANOMA, MALIGNANT, SKIN NOS 09/29/2006   other skin cancers -no further melanoma  . OSTEOARTHROSIS NOS, OTHER  [facility] SITE 09/29/2006  . Other specified forms of hearing loss 08/10/2009  . Pneumonia ~ 1969  . Presence of permanent cardiac pacemaker 06/02/2016  . VENTRICULAR TACHYCARDIA 09/29/2006    Past Surgical History:  Procedure Laterality Date  . AV NODE ABLATION  07/02/2016  . AV NODE ABLATION N/A 07/02/2016   Procedure: AV Node Ablation;  Surgeon: Deboraha Sprang, MD;  Location: [facility];  Service: Cardiovascular;  Laterality: N/A;  . CARDIAC CATHETERIZATION N/A 03/12/2015   Procedure: Left Heart Cath and Coronary Angiography;  Surgeon: [name], MD;  Location: [facility];  Service: Cardiovascular;  Laterality: N/A;  . CARDIOVERSION N/A 12/19/2015   Procedure: CARDIOVERSION;  Surgeon: [name], MD;  Location: [facility] ENDOSCOPY;  Service: Cardiovascular;  Laterality: N/A;  . CARDIOVERSION N/A 03/24/2016   Procedure: CARDIOVERSION;  Surgeon: [name], MD;  Location: [facility];  Service: Cardiovascular;  Laterality: N/A;  . CATARACT EXTRACTION W/ INTRAOCULAR LENS  IMPLANT, BILATERAL Bilateral   . CLIPPING OF ATRIAL APPENDAGE N/A 03/14/2015   Procedure: CLIPPING OF ATRIAL APPENDAGE;  Surgeon: [name], MD;  Location: [facility];  Service: Open Heart Surgery;  Laterality: N/A;  . COLONOSCOPY WITH PROPOFOL N/A 12/08/2014   Procedure: COLONOSCOPY WITH PROPOFOL;  Surgeon: [name], MD;  Location: [facility] ENDOSCOPY;  Service: Endoscopy;  Laterality: N/A;  .  CORONARY ARTERY BYPASS GRAFT N/A 03/14/2015   Procedure: CORONARY ARTERY BYPASS GRAFTING (CABG) x 4 (LIMA to LAD, SVG to DIAGONAL 2, SVG SEQUENTIALLY to OM1 and OM2);  Surgeon: [name], MD;  Location: [facility];  Service: Open Heart Surgery;  Laterality: N/A;  . INCISION AND DRAINAGE N/A 07/03/2016   Procedure: INCISION AND DRAINAGE of CHEST ABSCESS;  Surgeon: [name], MD;  Location: [facility];  Service: Thoracic;  Laterality: N/A;  . INSERT / REPLACE / REMOVE PACEMAKER  06/02/2016  . JOINT REPLACEMENT     . KNEE ARTHROSCOPY Left   . MELANOMA EXCISION     "upper back"  . PACEMAKER IMPLANT N/A 06/02/2016   Procedure: Pacemaker Implant;  Surgeon: Deboraha Sprang, MD;  Location: [facility];  Service: Cardiovascular;  Laterality: N/A;  . SHOULDER OPEN ROTATOR CUFF REPAIR Left   . SKIN CANCER EXCISION Right    "cheek; real deep"  . TEE WITHOUT CARDIOVERSION N/A 03/14/2015   Procedure: TRANSESOPHAGEAL ECHOCARDIOGRAM (TEE);  Surgeon: [name], MD;  Location: [facility];  Service: Open Heart Surgery;  Laterality: N/A;  . TEE WITHOUT CARDIOVERSION N/A 12/19/2015   Procedure: TRANSESOPHAGEAL ECHOCARDIOGRAM (TEE);  Surgeon: [name], MD;  Location: [facility];  Service: Cardiovascular;  Laterality: N/A;  . TEE WITHOUT CARDIOVERSION N/A 03/24/2016   Procedure: TRANSESOPHAGEAL ECHOCARDIOGRAM (TEE);  Surgeon: [name], MD;  Location: [facility];  Service: Cardiovascular;  Laterality: N/A;  . TONSILLECTOMY    . TOTAL KNEE ARTHROPLASTY Right     There were no vitals filed for this visit.              Neuro Re-education: Reviewed full HEP:   Access Code: [email or web address] URL: [email or web address] Date: 12/23/2019 Prepared by: Mady Haagensen  Exercises  . Heel Toe Raises with Counter Support - 1 x daily - 5 x weekly - 1-2 sets - 10 reps . Side to side weightshift - 1 x daily - 5 x weekly - 1-2 sets - 10 reps (cues for optimal weightshift) . Standing Hip Abduction with Counter Support - 1 x daily - 5 x weekly - 1-2 sets - 10 reps . Sit to Stand without Arm Support - 2 x daily - 5 x weekly - 2 sets - 5 reps (cues for technique, upright posture upon standing) . Standing Balance in Corner with Eyes Closed - 2 x daily - 5 x weekly - 1 sets - 3 reps - 20 sec hold . Head turns side to side and up/down - 1 x daily - 7 x weekly - 3 sets - 10 reps              Balance Exercises - 12/23/19 0001      Balance Exercises: Standing   Standing Eyes Opened  Wide (BOA);Foam/compliant surface;Limitations    Standing Eyes Opened Limitations Head nods and head turns x 10 reps each, no UE support, min guard assist    Standing Eyes Closed Wide (BOA);Foam/compliant surface;3 reps;20 secs    Wall Bumps Hip;Eyes opened;20 reps   UE support at chair   Retro Gait Upper extremity support;3 reps;Limitations   Forward/back walking along counter   Retro Gait Limitations Solid and compliant surface, 3 reps each    Sidestepping Foam/compliant support;3 reps;Limitations    Sidestepping Limitations Intermittent UE support, posterior lean, cues for more upright posture    Marching Foam/compliant surface;Upper extremity assist 2;Static;10 reps    Marching Limitations Marching forward along counter/then back x  3 reps UE support.  Solid surface, then compliant surface 3 reps each    Heel Raises Both;10 reps    Toe Raise Both;10 reps    Other Standing Exercises Alternating heel raises, 2 sets x 10 resp               PT Short Term Goals - 12/13/19 2018      PT SHORT TERM GOAL #1   Title Pt will be independent with initial HEP for balance and fall prevention.    Time 4    Period Weeks    Status New    Target Date 01/08/20      PT SHORT TERM GOAL #2   Title Pt will be able to ambulate >500' on level surfaces independently for improved household and community mobility.    Time 4    Period Weeks    Status New    Target Date 01/08/20      PT SHORT TERM GOAL #3   Title FGA will be assessed and LTG written.    Baseline FGA score 12/13/2019 13/24    Time 4    Period Weeks    Status Achieved    Target Date 01/08/20             PT Long Term Goals - 12/13/19 2018      PT LONG TERM GOAL #1   Title Pt will be independent with progressive HEP for balance to continue gains on own.    Time 8    Period Weeks    Status New      PT LONG TERM GOAL #2   Title Pt will improve FGA to at least 18/30 for decreased fall risk.    Baseline 13/30    Time 8     Period Weeks    Status New      PT LONG TERM GOAL #3   Title Pt will ambulate >1000' on varied surfaces including sidewalk/grass/around obstacles independently for improved community mobility.    Time 8    Period Weeks    Status New      PT LONG TERM GOAL #4   Title Pt will increase gait speed to >1.62m/s for improved gait safety.    Baseline 12/08/19 0.22m/s    Time 8    Period Weeks    Status New                 Plan - 12/23/19 1412    Clinical Impression Statement Focus of today's session is compliant surface balance exercises.  Cues for slowed pace with steps with transitions.  At end of session with cues for slowed pace and larger steps, he maintains larger steps x 100 ft.  He will continue to benefit from skilled PT to further work on balance and gait towards goals of improved mobility and decreased fall risk.    Personal Factors and Comorbidities Comorbidity 3+    Comorbidities CAD (03/14/15 CABG  LIMA > LAD, SVG > 2nd Diag and OM 1/2 , also had LAA clipping ), HTN, HFpEF, symptomatic bradycardia w/PPM, uncontrolled rapid AFib, Atypical AFlutter now s/p AVN ablation, RVOT VT intolerant of amiodarone (patient states with sun sensitivity and visual changes), chronic LBP    Examination-Activity Limitations Locomotion Level;Transfers;Stairs    Examination-Participation Restrictions Community Activity;Yard Work    Stability/Clinical Decision Making Evolving/Moderate complexity    Rehab Potential Good    PT Frequency 2x / week   plus eval   PT Duration 8  weeks    PT Treatment/Interventions ADLs/Self Care Home Management;DME Instruction;Gait training;Stair training;Functional mobility training;Therapeutic activities;Therapeutic exercise;Balance training;Neuromuscular re-education;Manual techniques;Patient/family education;Vestibular    PT Next Visit Plan Continue gait training without AD and with rollator. Pt not fan of cane at this time. Continue with balance activities progressing  to more compliant surfaces as able.  SLS and step length exercises. Progress HEP.    Consulted and Agree with Plan of Care Patient           Patient will benefit from skilled therapeutic intervention in order to improve the following deficits and impairments:  Abnormal gait, Decreased endurance, Decreased strength, Decreased mobility, Decreased balance  Visit Diagnosis: Unsteadiness on feet  Other abnormalities of gait and mobility     Problem List Patient Active Problem List   Diagnosis Date Noted  . GERD (gastroesophageal reflux disease) 11/12/2019  . Heme positive stool 11/10/2019  . Anticoagulated by anticoagulation treatment 05/31/2019  . Epistaxis 05/31/2019  . Acute gouty arthritis 04/29/2019  . Left arm swelling 01/05/2018  . IBS (irritable bowel syndrome) 06/30/2017  . Bilateral impacted cerumen 11/11/2016  . Imbalance 11/11/2016  . Encounter for well adult exam with abnormal findings 06/19/2016  . Increased prostate specific antigen (PSA) velocity 06/19/2016  . Gait disorder 01/27/2016  . Chronic anticoagulation 12/18/2015  . Hyperglycemia 06/17/2015  . CAD (coronary artery disease), native coronary artery 03/12/2015  . Atrial fibrillation with rapid ventricular response ([place])   . Solitary pulmonary nodule 05/24/2013  . Anxiety state 05/21/2012  . Chronic low back pain 05/21/2012  . Long term (current) use of anticoagulants 04/24/2012  . Hearing loss of both ears 11/11/2010  . Fatigue 11/16/2009  . VERTIGO 04/08/2007  . MELANOMA, MALIGNANT, SKIN NOS 09/29/2006  . Hyperlipidemia 09/29/2006  . ERECTILE DYSFUNCTION 09/29/2006  . VENTRICULAR TACHYCARDIA 09/29/2006  . BENIGN PROSTATIC HYPERTROPHY 09/29/2006  . OSTEOARTHROSIS NOS, OTHER SPEC SITE 09/29/2006    Jonathan Orozco W. 12/23/2019, 2:16 PM Frazier Butt., PT  [facility] [address] [place], [place], [address] Phone: [phone]   Fax:   [phone]  Name: Jonathan Orozco MRN: [id number] Date of Birth: [birth year]

## 2019-12-26 ENCOUNTER — Ambulatory Visit: Payer: Medicare Other | Admitting: Physical Therapy

## 2019-12-26 ENCOUNTER — Other Ambulatory Visit: Payer: Self-pay

## 2019-12-26 DIAGNOSIS — R2689 Other abnormalities of gait and mobility: Secondary | ICD-10-CM

## 2019-12-26 DIAGNOSIS — M6281 Muscle weakness (generalized): Secondary | ICD-10-CM

## 2019-12-26 DIAGNOSIS — R2681 Unsteadiness on feet: Secondary | ICD-10-CM

## 2019-12-27 ENCOUNTER — Encounter: Payer: Self-pay | Admitting: Physical Therapy

## 2019-12-27 NOTE — Therapy (Signed)
[facility] [address] [place], [place], [address] Phone: [phone]   Fax:  [phone]  Physical Therapy Treatment  Patient Details  Name: Jonathan Orozco MRN: [id number] Date of Birth: 07/01/1928 Referring Provider (PT): [name]   Encounter Date: 12/26/2019   PT End of Session - 12/27/19 1226    Visit Number 6    Number of Visits 17    Date for PT Re-Evaluation 40/81/44   90 day cert, 60 day poc   Authorization Type [employer] medicare so 10th visit progress note    PT Start Time 1324   therapist running behind from previous patient/this pt added to schedule from another therapist last minute   PT Stop Time 1402    PT Time Calculation (min) 38 min    Equipment Utilized During Treatment Gait belt    Activity Tolerance Patient tolerated treatment well    Behavior During Therapy [facility] for tasks assessed/performed           Past Medical History:  Diagnosis Date  . ABUSE, ALCOHOL, IN REMISSION 04/08/2007  . ALLERGIC RHINITIS 09/29/2006  . Arthritis    "hands" (07/02/2016)  . Atrial fibrillation ([facility])   . Atrial fibrillation with RVR ([place]) 07/02/2016  . Atrial flutter ([place]) 03/28/2010   a. s/p prior rfca;  b. recurrent paroxysmal flutter 04/2012;  c. pradaxa initiated 04/2012.  [name] BENIGN PROSTATIC HYPERTROPHY 09/29/2006  . BPPV (benign paroxysmal positional vertigo) 04/08/2007  . CAD (coronary artery disease)    a. LHC 2/17: EF 55-65%, LM 75, pLAD 75, oD1 75, oD2 65, D3 85, oLCx 99, oOM1 75, pRCA 25 >> CABG  . Carotid stenosis    a. Carotid US 2/17:  Bilateral ICA 1-39% ICA  . Chronic lower back pain   . COLONIC POLYPS, HX OF 06/23/2007  . 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 Diverticulosis   . ERECTILE DYSFUNCTION 09/29/2006  . GERD (gastroesophageal reflux disease)   . GLUCOSE INTOLERANCE 03/19/2010  . HYPERLIPIDEMIA 09/29/2006  . HYPERTENSION 09/29/2006  . Long term (current) use of anticoagulants 04/26/2010  .  MELANOMA, MALIGNANT, SKIN NOS 09/29/2006   other skin cancers -no further melanoma  . OSTEOARTHROSIS NOS, OTHER [facility] SITE 09/29/2006  . Other specified forms of hearing loss 08/10/2009  . Pneumonia ~ 1969  . Presence of permanent cardiac pacemaker 06/02/2016  . VENTRICULAR TACHYCARDIA 09/29/2006    Past Surgical History:  Procedure Laterality Date  . AV NODE ABLATION  07/02/2016  . AV NODE ABLATION N/A 07/02/2016   Procedure: AV Node Ablation;  Surgeon: Deboraha Sprang, MD;  Location: [facility];  Service: Cardiovascular;  Laterality: N/A;  . CARDIAC CATHETERIZATION N/A 03/12/2015   Procedure: Left Heart Cath and Coronary Angiography;  Surgeon: [name], MD;  Location: [facility];  Service: Cardiovascular;  Laterality: N/A;  . CARDIOVERSION N/A 12/19/2015   Procedure: CARDIOVERSION;  Surgeon: [name], MD;  Location: [facility] ENDOSCOPY;  Service: Cardiovascular;  Laterality: N/A;  . CARDIOVERSION N/A 03/24/2016   Procedure: CARDIOVERSION;  Surgeon: [name], MD;  Location: [facility];  Service: Cardiovascular;  Laterality: N/A;  . CATARACT EXTRACTION W/ INTRAOCULAR LENS  IMPLANT, BILATERAL Bilateral   . CLIPPING OF ATRIAL APPENDAGE N/A 03/14/2015   Procedure: CLIPPING OF ATRIAL APPENDAGE;  Surgeon: [name], MD;  Location: [facility];  Service: Open Heart Surgery;  Laterality: N/A;  . COLONOSCOPY WITH PROPOFOL N/A 12/08/2014   Procedure: COLONOSCOPY WITH PROPOFOL;  Surgeon: [name], MD;  Location: [facility] ENDOSCOPY;  Service: Endoscopy;  Laterality: N/A;  . CORONARY ARTERY BYPASS GRAFT N/A 03/14/2015   Procedure: CORONARY ARTERY BYPASS GRAFTING (CABG) x 4 (LIMA to LAD, SVG to DIAGONAL 2, SVG SEQUENTIALLY to OM1 and OM2);  Surgeon: [name], MD;  Location: [facility];  Service: Open Heart Surgery;  Laterality: N/A;  . INCISION AND DRAINAGE N/A 07/03/2016   Procedure: INCISION AND DRAINAGE of CHEST ABSCESS;  Surgeon: [name], MD;  Location: [facility];   Service: Thoracic;  Laterality: N/A;  . INSERT / REPLACE / REMOVE PACEMAKER  06/02/2016  . JOINT REPLACEMENT    . KNEE ARTHROSCOPY Left   . MELANOMA EXCISION     "upper back"  . PACEMAKER IMPLANT N/A 06/02/2016   Procedure: Pacemaker Implant;  Surgeon: Deboraha Sprang, MD;  Location: [facility];  Service: Cardiovascular;  Laterality: N/A;  . SHOULDER OPEN ROTATOR CUFF REPAIR Left   . SKIN CANCER EXCISION Right    "cheek; real deep"  . TEE WITHOUT CARDIOVERSION N/A 03/14/2015   Procedure: TRANSESOPHAGEAL ECHOCARDIOGRAM (TEE);  Surgeon: [name], MD;  Location: [facility];  Service: Open Heart Surgery;  Laterality: N/A;  . TEE WITHOUT CARDIOVERSION N/A 12/19/2015   Procedure: TRANSESOPHAGEAL ECHOCARDIOGRAM (TEE);  Surgeon: [name], MD;  Location: [facility];  Service: Cardiovascular;  Laterality: N/A;  . TEE WITHOUT CARDIOVERSION N/A 03/24/2016   Procedure: TRANSESOPHAGEAL ECHOCARDIOGRAM (TEE);  Surgeon: [name], MD;  Location: [facility];  Service: Cardiovascular;  Laterality: N/A;  . TONSILLECTOMY    . TOTAL KNEE ARTHROPLASTY Right     There were no vitals filed for this visit.   Subjective Assessment - 12/26/19 1325    Subjective Everything about the same; no falls since the last visit.    Patient is accompained by: --   wife   Pertinent History 86 y.o. male  with history of CAD (03/14/15 CABG  LIMA > LAD, SVG > 2nd Diag and OM 1/2 , also had LAA clipping ), HTN, HFpEF, symptomatic bradycardia w/PPM, uncontrolled rapid AFib, Atypical AFlutter now s/p AVN ablation, RVOT VT intolerant of amiodarone (patient states with sun sensitivity and visual changes), chronic LBP, right TKR.    Currently in Pain? No/denies                             [facility] Adult PT Treatment/Exercise - 12/27/19 1218      Transfers   Transfers Sit to Stand;Stand to Sit    Sit to Stand 5: Supervision    Sit to Stand Details Verbal cues for technique;Verbal cues for  sequencing    Stand to Sit 5: Supervision    Number of Reps --   At least 5 reps throughout session   Comments PT provides cues for upright posture, including terminal knee extension.      Ambulation/Gait   Ambulation/Gait Yes    Ambulation/Gait Assistance 5: Supervision;4: Min guard    Ambulation Distance (Feet) 800 Feet    Assistive device 4-wheeled walker;None    Gait Pattern Step-through pattern;Decreased step length - right;Decreased step length - left    Ambulation Surface Level;Indoor    Gait Comments Additional gait x 230 ft with cues for upright posture no device, cues for arm swing.  Pt appears to have better balance, decreased episodes of shuffling steps with transitions with cues for better posture.  Balance Exercises - 12/26/19 1325      Balance Exercises: Standing   Stepping Strategy Anterior;Posterior;Lateral;Foam/compliant surface;10 reps;UE support;Limitations    Stepping Strategy Limitations Standing on balance beam, UE support intermittent and min guard assist    Rockerboard Anterior/posterior;Head turns;Limitations    Rockerboard Limitations Anterior/posterior weightshifting for ankle strategy, hip strategy work x 10 reps each, then balancing midline with head turns x 5, head nods x 5 reps; forward step taps then return to midline on board, x 10 reps.    Sidestepping Foam/compliant support;3 reps;Limitations    Sidestepping Limitations On balance beam, UE support    Heel Raises Both;20 reps    Toe Raise Both;20 reps    Other Standing Exercises Standing on balance beam:  forward<>back step and weightshift x 10 reps 1 UE support and min guard    Other Standing Exercises Comments M[place]ats to up on toes x 10 reps, 2 sets on Airex               PT Short Term Goals - 12/13/19 2018      PT SHORT TERM GOAL #1   Title Pt will be independent with initial HEP for balance and fall prevention.    Time 4    Period Weeks    Status New    Target  Date 01/08/20      PT SHORT TERM GOAL #2   Title Pt will be able to ambulate >500' on level surfaces independently for improved household and community mobility.    Time 4    Period Weeks    Status New    Target Date 01/08/20      PT SHORT TERM GOAL #3   Title FGA will be assessed and LTG written.    Baseline FGA score 12/13/2019 13/24    Time 4    Period Weeks    Status Achieved    Target Date 01/08/20             PT Long Term Goals - 12/13/19 2018      PT LONG TERM GOAL #1   Title Pt will be independent with progressive HEP for balance to continue gains on own.    Time 8    Period Weeks    Status New      PT LONG TERM GOAL #2   Title Pt will improve FGA to at least 18/30 for decreased fall risk.    Baseline 13/30    Time 8    Period Weeks    Status New      PT LONG TERM GOAL #3   Title Pt will ambulate >1000' on varied surfaces including sidewalk/grass/around obstacles independently for improved community mobility.    Time 8    Period Weeks    Status New      PT LONG TERM GOAL #4   Title Pt will increase gait speed to >1.41m/s for improved gait safety.    Baseline 12/08/19 0.2m/s    Time 8    Period Weeks    Status New                 Plan - 12/27/19 1227    Clinical Impression Statement Pt more quick to correct posture today, with cues for terminal knee extension and upright posture, which seems to translate to better overall balance and decreased episodes of short, shuffling steps.  He continues to be motivated for therapy sessions and feels he is moving better at home.    Personal  Factors and Comorbidities Comorbidity 3+    Comorbidities CAD (03/14/15 CABG  LIMA > LAD, SVG > 2nd Diag and OM 1/2 , also had LAA clipping ), HTN, HFpEF, symptomatic bradycardia w/PPM, uncontrolled rapid AFib, Atypical AFlutter now s/p AVN ablation, RVOT VT intolerant of amiodarone (patient states with sun sensitivity and visual changes), chronic LBP    Examination-Activity  Limitations Locomotion Level;Transfers;Stairs    Examination-Participation Restrictions Community Activity;Yard Work    [employer] Evolving/Moderate complexity    Rehab Potential Good    PT Frequency 2x / week   plus eval   PT Duration 8 weeks    PT Treatment/Interventions ADLs/Self Care Home Management;DME Instruction;Gait training;Stair training;Functional mobility training;Therapeutic activities;Therapeutic exercise;Balance training;Neuromuscular re-education;Manual techniques;Patient/family education;Vestibular    PT Next Visit Plan Continue gait training without AD and with rollator. Continue with balance activities progressing to more compliant surfaces as able.  SLS and step length exercises. Progress HEP as able    Consulted and Agree with Plan of Care Patient           Patient will benefit from skilled therapeutic intervention in order to improve the following deficits and impairments:  Abnormal gait, Decreased endurance, Decreased strength, Decreased mobility, Decreased balance  Visit Diagnosis: Unsteadiness on feet  Other abnormalities of gait and mobility  Muscle weakness (generalized)     Problem List Patient Active Problem List   Diagnosis Date Noted  . GERD (gastroesophageal reflux disease) 11/12/2019  . Heme positive stool 11/10/2019  . Anticoagulated by anticoagulation treatment 05/31/2019  . Epistaxis 05/31/2019  . Acute gouty arthritis 04/29/2019  . Left arm swelling 01/05/2018  . IBS (irritable bowel syndrome) 06/30/2017  . Bilateral impacted cerumen 11/11/2016  . Imbalance 11/11/2016  . Encounter for well adult exam with abnormal findings 06/19/2016  . Increased prostate specific antigen (PSA) velocity 06/19/2016  . Gait disorder 01/27/2016  . Chronic anticoagulation 12/18/2015  . Hyperglycemia 06/17/2015  . CAD (coronary artery disease), native coronary artery 03/12/2015  . Atrial fibrillation with rapid ventricular response  ([place])   . Solitary pulmonary nodule 05/24/2013  . Anxiety state 05/21/2012  . Chronic low back pain 05/21/2012  . Long term (current) use of anticoagulants 04/24/2012  . Hearing loss of both ears 11/11/2010  . Fatigue 11/16/2009  . VERTIGO 04/08/2007  . MELANOMA, MALIGNANT, SKIN NOS 09/29/2006  . Hyperlipidemia 09/29/2006  . ERECTILE DYSFUNCTION 09/29/2006  . VENTRICULAR TACHYCARDIA 09/29/2006  . BENIGN PROSTATIC HYPERTROPHY 09/29/2006  . OSTEOARTHROSIS NOS, OTHER SPEC SITE 09/29/2006    Shabre Kreher W. 12/27/2019, 12:31 PM  Frazier Butt., PT   [facility] [address] [place], [place], [address] Phone: [phone]   Fax:  [phone]  Name: Jonathan Orozco MRN: [id number] Date of Birth: 1928-02-28

## 2020-01-03 ENCOUNTER — Other Ambulatory Visit: Payer: Self-pay

## 2020-01-03 ENCOUNTER — Encounter: Payer: Self-pay | Admitting: Physical Therapy

## 2020-01-03 ENCOUNTER — Ambulatory Visit: Payer: Medicare Other | Admitting: Physical Therapy

## 2020-01-03 VITALS — BP 166/82 | HR 71

## 2020-01-03 DIAGNOSIS — R2689 Other abnormalities of gait and mobility: Secondary | ICD-10-CM | POA: Diagnosis not present

## 2020-01-03 DIAGNOSIS — R2681 Unsteadiness on feet: Secondary | ICD-10-CM

## 2020-01-03 NOTE — Patient Instructions (Addendum)
Access Code: [email or web address] URL: [email or web address] Date: 01/03/2020 Prepared by: Mady Haagensen  Exercises Heel Toe Raises with Counter Support - 1 x daily - 5 x weekly - 1-2 sets - 10 reps Side to side weightshift - 1 x daily - 5 x weekly - 1-2 sets - 10 reps Standing Hip Abduction with Counter Support - 1 x daily - 5 x weekly - 1-2 sets - 10 reps Sit to Stand without Arm Support - 2 x daily - 5 x weekly - 2 sets - 5 reps Standing Balance in Corner with Eyes Closed - 2 x daily - 5 x weekly - 1 sets - 3 reps - 20 sec hold Head turns side to side and up/down - 1 x daily - 7 x weekly - 3 sets - 10 reps  Added 01/03/2020: Seated Hamstring Stretch - 1 x daily - 7 x weekly - 3 sets - 10 reps

## 2020-01-04 NOTE — Therapy (Signed)
[facility] [address] [place], [place], [address] Phone: [phone]   Fax:  [phone]  Physical Therapy Treatment  Patient Details  Name: Jonathan Orozco MRN: [id number] Date of Birth: 31-May-1928 Referring Provider (PT): [name]   Encounter Date: 01/03/2020   PT End of Session - 01/04/20 0651    Visit Number 7    Number of Visits 17    Date for PT Re-Evaluation 66/44/03   90 day cert, 60 day poc   Authorization Type [employer] medicare so 10th visit progress note    Progress Note Due on Visit 10    PT Start Time 1104    PT Stop Time 1145    PT Time Calculation (min) 41 min    Equipment Utilized During Treatment Gait belt    Activity Tolerance Patient tolerated treatment well   Assessed BP several times in session; elevated initially, but not out of parameters for working in therapy   Behavior During Therapy Jonathan Orozco for tasks assessed/performed           Past Medical History:  Diagnosis Date  . ABUSE, ALCOHOL, IN REMISSION 04/08/2007  . ALLERGIC RHINITIS 09/29/2006  . Arthritis    "hands" (07/02/2016)  . Atrial fibrillation ([place])   . Atrial fibrillation with RVR ([place]) 07/02/2016  . Atrial flutter ([place]) 03/28/2010   a. s/p prior rfca;  b. recurrent paroxysmal flutter 04/2012;  c. pradaxa initiated 04/2012.  [name] BENIGN PROSTATIC HYPERTROPHY 09/29/2006  . BPPV (benign paroxysmal positional vertigo) 04/08/2007  . CAD (coronary artery disease)    a. LHC 2/17: EF 55-65%, LM 75, pLAD 75, oD1 75, oD2 65, D3 85, oLCx 99, oOM1 75, pRCA 25 >> CABG  . Carotid stenosis    a. Carotid US 2/17:  Bilateral ICA 1-39% ICA  . Chronic lower back pain   . COLONIC POLYPS, HX OF 06/23/2007  . 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 Diverticulosis   . ERECTILE DYSFUNCTION 09/29/2006  . GERD (gastroesophageal reflux disease)   . GLUCOSE INTOLERANCE 03/19/2010  . HYPERLIPIDEMIA 09/29/2006  . HYPERTENSION 09/29/2006  . Long term (current) use of  anticoagulants 04/26/2010  . MELANOMA, MALIGNANT, SKIN NOS 09/29/2006   other skin cancers -no further melanoma  . OSTEOARTHROSIS NOS, OTHER [facility] SITE 09/29/2006  . Other specified forms of hearing loss 08/10/2009  . Pneumonia ~ 1969  . Presence of permanent cardiac pacemaker 06/02/2016  . VENTRICULAR TACHYCARDIA 09/29/2006    Past Surgical History:  Procedure Laterality Date  . AV NODE ABLATION  07/02/2016  . AV NODE ABLATION N/A 07/02/2016   Procedure: AV Node Ablation;  Surgeon: Deboraha Sprang, MD;  Location: [facility];  Service: Cardiovascular;  Laterality: N/A;  . CARDIAC CATHETERIZATION N/A 03/12/2015   Procedure: Left Heart Cath and Coronary Angiography;  Surgeon: [name], MD;  Location: [facility];  Service: Cardiovascular;  Laterality: N/A;  . CARDIOVERSION N/A 12/19/2015   Procedure: CARDIOVERSION;  Surgeon: [name], MD;  Location: [facility];  Service: Cardiovascular;  Laterality: N/A;  . CARDIOVERSION N/A 03/24/2016   Procedure: CARDIOVERSION;  Surgeon: [name], MD;  Location: [facility];  Service: Cardiovascular;  Laterality: N/A;  . CATARACT EXTRACTION W/ INTRAOCULAR LENS  IMPLANT, BILATERAL Bilateral   . CLIPPING OF ATRIAL APPENDAGE N/A 03/14/2015   Procedure: CLIPPING OF ATRIAL APPENDAGE;  Surgeon: [name], MD;  Location: [facility];  Service: Open Heart Surgery;  Laterality: N/A;  . COLONOSCOPY  WITH PROPOFOL N/A 12/08/2014   Procedure: COLONOSCOPY WITH PROPOFOL;  Surgeon: [name], MD;  Location: [facility];  Service: Endoscopy;  Laterality: N/A;  . CORONARY ARTERY BYPASS GRAFT N/A 03/14/2015   Procedure: CORONARY ARTERY BYPASS GRAFTING (CABG) x 4 (LIMA to LAD, SVG to DIAGONAL 2, SVG SEQUENTIALLY to OM1 and OM2);  Surgeon: [name], MD;  Location: [facility];  Service: Open Heart Surgery;  Laterality: N/A;  . INCISION AND DRAINAGE N/A 07/03/2016   Procedure: INCISION AND DRAINAGE of CHEST ABSCESS;  Surgeon: [name], MD;  Location: [facility];  Service: Thoracic;  Laterality: N/A;  . INSERT / REPLACE / REMOVE PACEMAKER  06/02/2016  . JOINT REPLACEMENT    . KNEE ARTHROSCOPY Left   . MELANOMA EXCISION     "upper back"  . PACEMAKER IMPLANT N/A 06/02/2016   Procedure: Pacemaker Implant;  Surgeon: Deboraha Sprang, MD;  Location: [facility];  Service: Cardiovascular;  Laterality: N/A;  . SHOULDER OPEN ROTATOR CUFF REPAIR Left   . SKIN CANCER EXCISION Right    "cheek; real deep"  . TEE WITHOUT CARDIOVERSION N/A 03/14/2015   Procedure: TRANSESOPHAGEAL ECHOCARDIOGRAM (TEE);  Surgeon: [name], MD;  Location: [facility];  Service: Open Heart Surgery;  Laterality: N/A;  . TEE WITHOUT CARDIOVERSION N/A 12/19/2015   Procedure: TRANSESOPHAGEAL ECHOCARDIOGRAM (TEE);  Surgeon: [name], MD;  Location: [facility];  Service: Cardiovascular;  Laterality: N/A;  . TEE WITHOUT CARDIOVERSION N/A 03/24/2016   Procedure: TRANSESOPHAGEAL ECHOCARDIOGRAM (TEE);  Surgeon: [name], MD;  Location: [facility];  Service: Cardiovascular;  Laterality: N/A;  . TONSILLECTOMY    . TOTAL KNEE ARTHROPLASTY Right     Vitals:   01/03/20 1109 01/03/20 1114  BP: (!) 171/97 (!) 166/82  Pulse: 88 71     Subjective Assessment - 01/03/20 1106    Subjective No falls, just didn't feel well or was very steady yesterday.  Getting up and moving too fast contributes to me not moving well.  Wife present today and says she was concerned about him last night, not feeling great.    Patient is accompained by: --   wife   Pertinent History 86 y.o. male  with history of CAD (03/14/15 CABG  LIMA > LAD, SVG > 2nd Diag and OM 1/2 , also had LAA clipping ), HTN, HFpEF, symptomatic bradycardia w/PPM, uncontrolled rapid AFib, Atypical AFlutter now s/p AVN ablation, RVOT VT intolerant of amiodarone (patient states with sun sensitivity and visual changes), chronic LBP, right TKR.    Patient Stated Goals (Pt wants to get balance  better)    Currently in Pain? No/denies                             [facility] Adult PT Treatment/Exercise - 01/04/20 0001      Ambulation/Gait   Ambulation/Gait Yes    Ambulation/Gait Assistance 5: Supervision;4: Min guard    Ambulation/Gait Assistance Details No device during session; cues for upright posture, step length, and arm swing.    Ambulation Distance (Feet) 350 Feet    Assistive device None    Gait Pattern Step-through pattern;Decreased step length - right;Decreased step length - left    Ambulation Surface Level;Indoor    Gait Comments Discussed with wife and patient that rollator at home would be best for longer distances to help with posture and step length.  Advised pt/wife to use rollator when he does  walking outside (as he mentioned he wants to do more, but PT advised against long distance walking without device).      Exercises   Exercises Knee/Hip      Knee/Hip Exercises: Stretches   Active Hamstring Stretch Right;Left;3 reps   15-30 seconds   Active Hamstring Stretch Limitations Foot propped on floor; pt needs cues for proper technique each rep      Knee/Hip Exercises: Seated   Sit to Sand 2 sets;5 reps   Review of HEP, then add'l 5 reps with focus on TKE           Neuro Re-education: Performed and reviewed full HEP:   Heel Toe Raises with Counter Support - 1 x daily - 5 x weekly - 1-2 sets - 10 reps  Side to side weightshift - 1 x daily - 5 x weekly - 1-2 sets - 10 reps (cues for optimal weightshift)  Standing Hip Abduction with Counter Support - 1 x daily - 5 x weekly - 1-2 sets - 10 reps  Sit to Stand without Arm Support - 2 x daily - 5 x weekly - 2 sets - 5 reps (cues for technique, upright posture upon standing)  Standing Balance in Corner with Eyes Closed - 2 x daily - 5 x weekly - 1 sets - 3 reps - 20 sec hold  Head turns side to side and up/down - 1 x daily - 7 x weekly - 3 sets - 10 reps    Balance Exercises - 01/03/20 1117       Balance Exercises: Standing   SLS with Vectors Solid surface;Upper extremity assist 2;Limitations    SLS with Vectors Limitations Alternating step taps x 10 reps, 2 sets    Marching Solid surface;Upper extremity assist 1;Static;10 reps    Marching Limitations Cues for widened BOS             PT Education - 01/04/20 0650    Education Details HEP addition for hamstring stretch    Person(s) Educated Patient;Spouse    Methods Explanation;Demonstration;Verbal cues;Tactile cues;Handout    Comprehension Verbalized understanding;Returned demonstration;Verbal cues required            PT Short Term Goals - 12/13/19 2018      PT SHORT TERM GOAL #1   Title Pt will be independent with initial HEP for balance and fall prevention.    Time 4    Period Weeks    Status New    Target Date 01/08/20      PT SHORT TERM GOAL #2   Title Pt will be able to ambulate >500' on level surfaces independently for improved household and community mobility.    Time 4    Period Weeks    Status New    Target Date 01/08/20      PT SHORT TERM GOAL #3   Title FGA will be assessed and LTG written.    Baseline FGA score 12/13/2019 13/24    Time 4    Period Weeks    Status Achieved    Target Date 01/08/20             PT Long Term Goals - 12/13/19 2018      PT LONG TERM GOAL #1   Title Pt will be independent with progressive HEP for balance to continue gains on own.    Time 8    Period Weeks    Status New      PT LONG TERM GOAL #2  Title Pt will improve FGA to at least 18/30 for decreased fall risk.    Baseline 13/30    Time 8    Period Weeks    Status New      PT LONG TERM GOAL #3   Title Pt will ambulate >1000' on varied surfaces including sidewalk/grass/around obstacles independently for improved community mobility.    Time 8    Period Weeks    Status New      PT LONG TERM GOAL #4   Title Pt will increase gait speed to >1.76m/s for improved gait safety.    Baseline 12/08/19 0.84m/s     Time 8    Period Weeks    Status New                 Plan - 01/04/20 8341    Clinical Impression Statement Wife present during this session today, with PT and pt reviewing full HEP and adding hamstring stretch to help offset crouched standing and gait pattern.  He continues to need cues for optimal posture in standing and with gait, including terminal knee extension for full upright posture in standing prior to initiating gait.  Gait inside today, with plans for outdoor gait using rollator next visit.    Personal Factors and Comorbidities Comorbidity 3+    Comorbidities CAD (03/14/15 CABG  LIMA > LAD, SVG > 2nd Diag and OM 1/2 , also had LAA clipping ), HTN, HFpEF, symptomatic bradycardia w/PPM, uncontrolled rapid AFib, Atypical AFlutter now s/p AVN ablation, RVOT VT intolerant of amiodarone (patient states with sun sensitivity and visual changes), chronic LBP    Examination-Activity Limitations Locomotion Level;Transfers;Stairs    Examination-Participation Restrictions Community Activity;Yard Work    [employer] Evolving/Moderate complexity    Rehab Potential Good    PT Frequency 2x / week   plus eval   PT Duration 8 weeks    PT Treatment/Interventions ADLs/Self Care Home Management;DME Instruction;Gait training;Stair training;Functional mobility training;Therapeutic activities;Therapeutic exercise;Balance training;Neuromuscular re-education;Manual techniques;Patient/family education;Vestibular    PT Next Visit Plan Need to assess STGs (make sure pt has appts through POC); continue working on posture, step lnegth, SLS; progress HEP as needed    Consulted and Agree with Plan of Care Patient;Family member/caregiver    Family Member Consulted wife           Patient will benefit from skilled therapeutic intervention in order to improve the following deficits and impairments:  Abnormal gait, Decreased endurance, Decreased strength, Decreased mobility, Decreased  balance  Visit Diagnosis: Unsteadiness on feet  Other abnormalities of gait and mobility     Problem List Patient Active Problem List   Diagnosis Date Noted  . GERD (gastroesophageal reflux disease) 11/12/2019  . Heme positive stool 11/10/2019  . Anticoagulated by anticoagulation treatment 05/31/2019  . Epistaxis 05/31/2019  . Acute gouty arthritis 04/29/2019  . Left arm swelling 01/05/2018  . IBS (irritable bowel syndrome) 06/30/2017  . Bilateral impacted cerumen 11/11/2016  . Imbalance 11/11/2016  . Encounter for well adult exam with abnormal findings 06/19/2016  . Increased prostate specific antigen (PSA) velocity 06/19/2016  . Gait disorder 01/27/2016  . Chronic anticoagulation 12/18/2015  . Hyperglycemia 06/17/2015  . CAD (coronary artery disease), native coronary artery 03/12/2015  . Atrial fibrillation with rapid ventricular response ([place])   . Solitary pulmonary nodule 05/24/2013  . Anxiety state 05/21/2012  . Chronic low back pain 05/21/2012  . Long term (current) use of anticoagulants 04/24/2012  . Hearing loss of both ears 11/11/2010  .  Fatigue 11/16/2009  . VERTIGO 04/08/2007  . MELANOMA, MALIGNANT, SKIN NOS 09/29/2006  . Hyperlipidemia 09/29/2006  . ERECTILE DYSFUNCTION 09/29/2006  . VENTRICULAR TACHYCARDIA 09/29/2006  . BENIGN PROSTATIC HYPERTROPHY 09/29/2006  . OSTEOARTHROSIS NOS, OTHER SPEC SITE 09/29/2006    Jonathan Nawrot W. 01/04/2020, 6:56 AM  Jonathan Butt., PT   [facility] [address] [place], [place], [address] Phone: [phone]   Fax:  [phone]  Name: RIVEN MABILE MRN: [id number] Date of Birth: 1928/02/09

## 2020-01-05 ENCOUNTER — Ambulatory Visit: Payer: Medicare Other | Admitting: Internal Medicine

## 2020-01-05 ENCOUNTER — Ambulatory Visit: Payer: Medicare Other | Attending: Internal Medicine | Admitting: Physical Therapy

## 2020-01-05 ENCOUNTER — Other Ambulatory Visit: Payer: Self-pay

## 2020-01-05 VITALS — BP 147/82

## 2020-01-05 DIAGNOSIS — R2689 Other abnormalities of gait and mobility: Secondary | ICD-10-CM | POA: Diagnosis not present

## 2020-01-05 DIAGNOSIS — R2681 Unsteadiness on feet: Secondary | ICD-10-CM | POA: Diagnosis present

## 2020-01-05 NOTE — Patient Instructions (Signed)

## 2020-01-05 NOTE — Therapy (Signed)
[facility] [address] [place], [place], [address] Phone: [phone]   Fax:  [phone]  Physical Therapy Treatment  Patient Details  Name: Jonathan Orozco MRN: [id number] Date of Birth: 16-Jan-1929 Referring Provider (PT): [name]   Encounter Date: 01/05/2020   PT End of Session - 01/05/20 1601    Visit Number 8    Number of Visits 17    Date for PT Re-Evaluation 58/85/02   90 day cert, 60 day poc   Authorization Type [employer] medicare so 10th visit progress note    Progress Note Due on Visit 10    PT Start Time 1318    PT Stop Time 1400    PT Time Calculation (min) 42 min    Equipment Utilized During Treatment Gait belt    Activity Tolerance Patient tolerated treatment well   Assessed BP several times in session; elevated initially, but not out of parameters for working in therapy   Behavior During Therapy [facility] for tasks assessed/performed           Past Medical History:  Diagnosis Date  . ABUSE, ALCOHOL, IN REMISSION 04/08/2007  . ALLERGIC RHINITIS 09/29/2006  . Arthritis    "hands" (07/02/2016)  . Atrial fibrillation ([place])   . Atrial fibrillation with RVR ([place]) 07/02/2016  . Atrial flutter ([place]) 03/28/2010   a. s/p prior rfca;  b. recurrent paroxysmal flutter 04/2012;  c. pradaxa initiated 04/2012.  [name] BENIGN PROSTATIC HYPERTROPHY 09/29/2006  . BPPV (benign paroxysmal positional vertigo) 04/08/2007  . CAD (coronary artery disease)    a. LHC 2/17: EF 55-65%, LM 75, pLAD 75, oD1 75, oD2 65, D3 85, oLCx 99, oOM1 75, pRCA 25 >> CABG  . Carotid stenosis    a. Carotid US 2/17:  Bilateral ICA 1-39% ICA  . Chronic lower back pain   . COLONIC POLYPS, HX OF 06/23/2007  . 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 Diverticulosis   . ERECTILE DYSFUNCTION 09/29/2006  . GERD (gastroesophageal reflux disease)   . GLUCOSE INTOLERANCE 03/19/2010  . HYPERLIPIDEMIA 09/29/2006  . HYPERTENSION 09/29/2006  . Long term (current) use of  anticoagulants 04/26/2010  . MELANOMA, MALIGNANT, SKIN NOS 09/29/2006   other skin cancers -no further melanoma  . OSTEOARTHROSIS NOS, OTHER [facility] SITE 09/29/2006  . Other specified forms of hearing loss 08/10/2009  . Pneumonia ~ 1969  . Presence of permanent cardiac pacemaker 06/02/2016  . VENTRICULAR TACHYCARDIA 09/29/2006    Past Surgical History:  Procedure Laterality Date  . AV NODE ABLATION  07/02/2016  . AV NODE ABLATION N/A 07/02/2016   Procedure: AV Node Ablation;  Surgeon: Deboraha Sprang, MD;  Location: [place] CV LAB;  Service: Cardiovascular;  Laterality: N/A;  . CARDIAC CATHETERIZATION N/A 03/12/2015   Procedure: Left Heart Cath and Coronary Angiography;  Surgeon: [name], MD;  Location: [place] CV LAB;  Service: Cardiovascular;  Laterality: N/A;  . CARDIOVERSION N/A 12/19/2015   Procedure: CARDIOVERSION;  Surgeon: [name], MD;  Location: [facility] ENDOSCOPY;  Service: Cardiovascular;  Laterality: N/A;  . CARDIOVERSION N/A 03/24/2016   Procedure: CARDIOVERSION;  Surgeon: [name], MD;  Location: [place];  Service: Cardiovascular;  Laterality: N/A;  . CATARACT EXTRACTION W/ INTRAOCULAR LENS  IMPLANT, BILATERAL Bilateral   . CLIPPING OF ATRIAL APPENDAGE N/A 03/14/2015   Procedure: CLIPPING OF ATRIAL APPENDAGE;  Surgeon: [name], MD;  Location: [place];  Service: Open Heart Surgery;  Laterality: N/A;  . COLONOSCOPY  WITH PROPOFOL N/A 12/08/2014   Procedure: COLONOSCOPY WITH PROPOFOL;  Surgeon: [name], MD;  Location: [facility] ENDOSCOPY;  Service: Endoscopy;  Laterality: N/A;  . CORONARY ARTERY BYPASS GRAFT N/A 03/14/2015   Procedure: CORONARY ARTERY BYPASS GRAFTING (CABG) x 4 (LIMA to LAD, SVG to DIAGONAL 2, SVG SEQUENTIALLY to OM1 and OM2);  Surgeon: [name], MD;  Location: [place];  Service: Open Heart Surgery;  Laterality: N/A;  . INCISION AND DRAINAGE N/A 07/03/2016   Procedure: INCISION AND DRAINAGE of CHEST ABSCESS;  Surgeon: [name], MD;  Location: [place];  Service: Thoracic;  Laterality: N/A;  . INSERT / REPLACE / REMOVE PACEMAKER  06/02/2016  . JOINT REPLACEMENT    . KNEE ARTHROSCOPY Left   . MELANOMA EXCISION     "upper back"  . PACEMAKER IMPLANT N/A 06/02/2016   Procedure: Pacemaker Implant;  Surgeon: Deboraha Sprang, MD;  Location: [place] CV LAB;  Service: Cardiovascular;  Laterality: N/A;  . SHOULDER OPEN ROTATOR CUFF REPAIR Left   . SKIN CANCER EXCISION Right    "cheek; real deep"  . TEE WITHOUT CARDIOVERSION N/A 03/14/2015   Procedure: TRANSESOPHAGEAL ECHOCARDIOGRAM (TEE);  Surgeon: [name], MD;  Location: [place];  Service: Open Heart Surgery;  Laterality: N/A;  . TEE WITHOUT CARDIOVERSION N/A 12/19/2015   Procedure: TRANSESOPHAGEAL ECHOCARDIOGRAM (TEE);  Surgeon: [name], MD;  Location: [place];  Service: Cardiovascular;  Laterality: N/A;  . TEE WITHOUT CARDIOVERSION N/A 03/24/2016   Procedure: TRANSESOPHAGEAL ECHOCARDIOGRAM (TEE);  Surgeon: [name], MD;  Location: [place];  Service: Cardiovascular;  Laterality: N/A;  . TONSILLECTOMY    . TOTAL KNEE ARTHROPLASTY Right     Vitals:   01/05/20 1325  BP: (!) 147/82     Subjective Assessment - 01/05/20 1323    Subjective No falls since last visit.  Not sure if the (Omeprizole) medication is messing with my BP.  To see my family doctor later today.    Patient is accompained by: --   wife   Pertinent History 86 y.o. male  with history of CAD (03/14/15 CABG  LIMA > LAD, SVG > 2nd Diag and OM 1/2 , also had LAA clipping ), HTN, HFpEF, symptomatic bradycardia w/PPM, uncontrolled rapid AFib, Atypical AFlutter now s/p AVN ablation, RVOT VT intolerant of amiodarone (patient states with sun sensitivity and visual changes), chronic LBP, right TKR.    Patient Stated Goals (Pt wants to get balance better)    Currently in Pain? No/denies                             [facility] Adult PT Treatment/Exercise -  01/05/20 0001      Ambulation/Gait   Ambulation/Gait Yes    Ambulation/Gait Assistance 5: Supervision;4: Min guard    Ambulation Distance (Feet) 800 Feet    Assistive device Rollator    Gait Pattern Step-through pattern;Decreased step length - right;Decreased step length - left    Ambulation Surface Unlevel;Outdoor;Paved    Gait Comments Gait on sidewalk and on paved parking lot surfaces with slight inclines/declines.  Cues provided ahead of declines for slowed posture and to keep rollator close; cues provided for inclines for foot clearance      Self-Care   Self-Care Other Self-Care Comments    Other Self-Care Comments  Discussed fall prevention education for home environment.      Knee/Hip Exercises: Stretches   Active Hamstring Stretch Right;Left;2  reps    Active Hamstring Stretch Limitations Reviewed HEP from last visit, pt return demo with verbal cues               Balance Exercises - 01/05/20 0001      Balance Exercises: Standing   SLS with Vectors Upper extremity assist 2;Limitations;Foam/compliant surface    SLS with Vectors Limitations Alternating step taps x 10 reps, 2 sets to 6" step and to 12" step, standing on Airex    Tandem Gait Forward;Upper extremity support;Foam/compliant surface;2 reps   On balance beam, min assist from PT   Sidestepping Foam/compliant support;3 reps;Limitations    Sidestepping Limitations On balance beam, UE support    Heel Raises Both;20 reps   On Airex   Toe Raise Both;20 reps   On Airex   Sit to Stand Standard surface;Without upper extremity support   Cues for upright posture, terminal knee extension            PT Education - 01/05/20 1601    Education Details Fall prevention education    Person(s) Educated Patient    Methods Explanation;Handout    Comprehension Verbalized understanding            PT Short Term Goals - 01/05/20 1603      PT SHORT TERM GOAL #1   Title Pt will be independent with initial HEP for balance and  fall prevention.    Baseline needs occasional cues for technique    Time 4    Period Weeks    Status Partially Met    Target Date 01/08/20      PT SHORT TERM GOAL #2   Title Pt will be able to ambulate >500' on level surfaces independently for improved household and community mobility.    Baseline supervision with rollator 800 ft    Time 4    Period Weeks    Status Partially Met    Target Date 01/08/20      PT SHORT TERM GOAL #3   Title FGA will be assessed and LTG written.    Baseline FGA score 12/13/2019 13/24    Time 4    Period Weeks    Status Achieved    Target Date 01/08/20             PT Long Term Goals - 12/13/19 2018      PT LONG TERM GOAL #1   Title Pt will be independent with progressive HEP for balance to continue gains on own.    Time 8    Period Weeks    Status New      PT LONG TERM GOAL #2   Title Pt will improve FGA to at least 18/30 for decreased fall risk.    Baseline 13/30    Time 8    Period Weeks    Status New      PT LONG TERM GOAL #3   Title Pt will ambulate >1000' on varied surfaces including sidewalk/grass/around obstacles independently for improved community mobility.    Time 8    Period Weeks    Status New      PT LONG TERM GOAL #4   Title Pt will increase gait speed to >1.11m/s for improved gait safety.    Baseline 12/08/19 0.14m/s    Time 8    Period Weeks    Status New                 Plan - 01/05/20 1602    Clinical  Impression Statement STGs assessed with STG 1 and 2 partially met, wtih pt needing cues for technique/safety with HEP; pt ambulates 800 ft iwth rollator but with supervision for safety.  He is still impulsive in his movmeent patterns at times, and continues to need UE support for more challenging balance exercises.He will continue to benefit from skilled PT to further address balance and gait for improved functional mobility and decreased fall risk.    Personal Factors and Comorbidities Comorbidity 3+     Comorbidities CAD (03/14/15 CABG  LIMA > LAD, SVG > 2nd Diag and OM 1/2 , also had LAA clipping ), HTN, HFpEF, symptomatic bradycardia w/PPM, uncontrolled rapid AFib, Atypical AFlutter now s/p AVN ablation, RVOT VT intolerant of amiodarone (patient states with sun sensitivity and visual changes), chronic LBP    Examination-Activity Limitations Locomotion Level;Transfers;Stairs    Examination-Participation Restrictions Community Activity;Yard Work    [employer] Evolving/Moderate complexity    Rehab Potential Good    PT Frequency 2x / week   plus eval   PT Duration 8 weeks    PT Treatment/Interventions ADLs/Self Care Home Management;DME Instruction;Gait training;Stair training;Functional mobility training;Therapeutic activities;Therapeutic exercise;Balance training;Neuromuscular re-education;Manual techniques;Patient/family education;Vestibular    PT Next Visit Plan Continue to work on balance, compliant surfaces, step length, SLS; progress HEP as appropriate.    Consulted and Agree with Plan of Care Patient;Family member/caregiver    Family Member Consulted wife           Patient will benefit from skilled therapeutic intervention in order to improve the following deficits and impairments:  Abnormal gait, Decreased endurance, Decreased strength, Decreased mobility, Decreased balance  Visit Diagnosis: Other abnormalities of gait and mobility  Unsteadiness on feet     Problem List Patient Active Problem List   Diagnosis Date Noted  . GERD (gastroesophageal reflux disease) 11/12/2019  . Heme positive stool 11/10/2019  . Anticoagulated by anticoagulation treatment 05/31/2019  . Epistaxis 05/31/2019  . Acute gouty arthritis 04/29/2019  . Left arm swelling 01/05/2018  . IBS (irritable bowel syndrome) 06/30/2017  . Bilateral impacted cerumen 11/11/2016  . Imbalance 11/11/2016  . Encounter for well adult exam with abnormal findings 06/19/2016  . Increased prostate  specific antigen (PSA) velocity 06/19/2016  . Gait disorder 01/27/2016  . Chronic anticoagulation 12/18/2015  . Hyperglycemia 06/17/2015  . CAD (coronary artery disease), native coronary artery 03/12/2015  . Atrial fibrillation with rapid ventricular response ([place])   . Solitary pulmonary nodule 05/24/2013  . Anxiety state 05/21/2012  . Chronic low back pain 05/21/2012  . Long term (current) use of anticoagulants 04/24/2012  . Hearing loss of both ears 11/11/2010  . Fatigue 11/16/2009  . VERTIGO 04/08/2007  . MELANOMA, MALIGNANT, SKIN NOS 09/29/2006  . Hyperlipidemia 09/29/2006  . ERECTILE DYSFUNCTION 09/29/2006  . VENTRICULAR TACHYCARDIA 09/29/2006  . BENIGN PROSTATIC HYPERTROPHY 09/29/2006  . OSTEOARTHROSIS NOS, OTHER SPEC SITE 09/29/2006    Quindarrius [place] W. 01/05/2020, 4:07 PM Frazier Butt., PT  [facility] [address] [place], [place], [address] Phone: [phone]   Fax:  [phone]  Name: MINORU CHAP MRN: [id number] Date of Birth: 10/18/1928

## 2020-01-10 ENCOUNTER — Ambulatory Visit: Payer: Medicare Other | Admitting: Physical Therapy

## 2020-01-10 ENCOUNTER — Other Ambulatory Visit: Payer: Self-pay

## 2020-01-10 DIAGNOSIS — R2681 Unsteadiness on feet: Secondary | ICD-10-CM

## 2020-01-10 DIAGNOSIS — R2689 Other abnormalities of gait and mobility: Secondary | ICD-10-CM

## 2020-01-10 NOTE — Therapy (Signed)
[facility] [address] [place], [place], [address] Phone: [phone]   Fax:  [phone]  Physical Therapy Treatment  Patient Details  Name: Jonathan Orozco MRN: [id number] Date of Birth: 11-Mar-1928 Referring Provider (PT): [name]   Encounter Date: 01/10/2020   PT End of Session - 01/10/20 1415    Visit Number 9    Number of Visits 17    Date for PT Re-Evaluation 68/34/19   90 day cert, 60 day poc   Authorization Type [employer] medicare so 10th visit progress note    Progress Note Due on Visit 10    PT Start Time 1232    PT Stop Time 1315    PT Time Calculation (min) 43 min    Equipment Utilized During Treatment Gait belt    Activity Tolerance Patient tolerated treatment well    Behavior During Therapy [facility] for tasks assessed/performed           Past Medical History:  Diagnosis Date  . ABUSE, ALCOHOL, IN REMISSION 04/08/2007  . ALLERGIC RHINITIS 09/29/2006  . Arthritis    "hands" (07/02/2016)  . Atrial fibrillation ([place])   . Atrial fibrillation with RVR ([place]) 07/02/2016  . Atrial flutter ([place]) 03/28/2010   a. s/p prior rfca;  b. recurrent paroxysmal flutter 04/2012;  c. pradaxa initiated 04/2012.  [name] BENIGN PROSTATIC HYPERTROPHY 09/29/2006  . BPPV (benign paroxysmal positional vertigo) 04/08/2007  . CAD (coronary artery disease)    a. LHC 2/17: EF 55-65%, LM 75, pLAD 75, oD1 75, oD2 65, D3 85, oLCx 99, oOM1 75, pRCA 25 >> CABG  . Carotid stenosis    a. Carotid US 2/17:  Bilateral ICA 1-39% ICA  . Chronic lower back pain   . COLONIC POLYPS, HX OF 06/23/2007  . 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 Diverticulosis   . ERECTILE DYSFUNCTION 09/29/2006  . GERD (gastroesophageal reflux disease)   . GLUCOSE INTOLERANCE 03/19/2010  . HYPERLIPIDEMIA 09/29/2006  . HYPERTENSION 09/29/2006  . Long term (current) use of anticoagulants 04/26/2010  . MELANOMA, MALIGNANT, SKIN NOS 09/29/2006   other skin cancers -no further  melanoma  . OSTEOARTHROSIS NOS, OTHER [facility] SITE 09/29/2006  . Other specified forms of hearing loss 08/10/2009  . Pneumonia ~ 1969  . Presence of permanent cardiac pacemaker 06/02/2016  . VENTRICULAR TACHYCARDIA 09/29/2006    Past Surgical History:  Procedure Laterality Date  . AV NODE ABLATION  07/02/2016  . AV NODE ABLATION N/A 07/02/2016   Procedure: AV Node Ablation;  Surgeon: Deboraha Sprang, MD;  Location: [place] CV LAB;  Service: Cardiovascular;  Laterality: N/A;  . CARDIAC CATHETERIZATION N/A 03/12/2015   Procedure: Left Heart Cath and Coronary Angiography;  Surgeon: [name], MD;  Location: [place] CV LAB;  Service: Cardiovascular;  Laterality: N/A;  . CARDIOVERSION N/A 12/19/2015   Procedure: CARDIOVERSION;  Surgeon: [name], MD;  Location: [facility] ENDOSCOPY;  Service: Cardiovascular;  Laterality: N/A;  . CARDIOVERSION N/A 03/24/2016   Procedure: CARDIOVERSION;  Surgeon: [name], MD;  Location: [place] ;  Service: Cardiovascular;  Laterality: N/A;  . CATARACT EXTRACTION W/ INTRAOCULAR LENS  IMPLANT, BILATERAL Bilateral   . CLIPPING OF ATRIAL APPENDAGE N/A 03/14/2015   Procedure: CLIPPING OF ATRIAL APPENDAGE;  Surgeon: [name], MD;  Location: [place];  Service: Open Heart Surgery;  Laterality: N/A;  . COLONOSCOPY WITH PROPOFOL N/A 12/08/2014   Procedure: COLONOSCOPY WITH PROPOFOL;  Surgeon: [name], MD;  Location: [facility]  ENDOSCOPY;  Service: Endoscopy;  Laterality: N/A;  . CORONARY ARTERY BYPASS GRAFT N/A 03/14/2015   Procedure: CORONARY ARTERY BYPASS GRAFTING (CABG) x 4 (LIMA to LAD, SVG to DIAGONAL 2, SVG SEQUENTIALLY to OM1 and OM2);  Surgeon: [name], MD;  Location: [place];  Service: Open Heart Surgery;  Laterality: N/A;  . INCISION AND DRAINAGE N/A 07/03/2016   Procedure: INCISION AND DRAINAGE of CHEST ABSCESS;  Surgeon: [name], MD;  Location: [place];  Service: Thoracic;  Laterality: N/A;  . INSERT / REPLACE / REMOVE  PACEMAKER  06/02/2016  . JOINT REPLACEMENT    . KNEE ARTHROSCOPY Left   . MELANOMA EXCISION     "upper back"  . PACEMAKER IMPLANT N/A 06/02/2016   Procedure: Pacemaker Implant;  Surgeon: Deboraha Sprang, MD;  Location: [place] CV LAB;  Service: Cardiovascular;  Laterality: N/A;  . SHOULDER OPEN ROTATOR CUFF REPAIR Left   . SKIN CANCER EXCISION Right    "cheek; real deep"  . TEE WITHOUT CARDIOVERSION N/A 03/14/2015   Procedure: TRANSESOPHAGEAL ECHOCARDIOGRAM (TEE);  Surgeon: [name], MD;  Location: [place];  Service: Open Heart Surgery;  Laterality: N/A;  . TEE WITHOUT CARDIOVERSION N/A 12/19/2015   Procedure: TRANSESOPHAGEAL ECHOCARDIOGRAM (TEE);  Surgeon: [name], MD;  Location: [place];  Service: Cardiovascular;  Laterality: N/A;  . TEE WITHOUT CARDIOVERSION N/A 03/24/2016   Procedure: TRANSESOPHAGEAL ECHOCARDIOGRAM (TEE);  Surgeon: [name], MD;  Location: [place];  Service: Cardiovascular;  Laterality: N/A;  . TONSILLECTOMY    . TOTAL KNEE ARTHROPLASTY Right     There were no vitals filed for this visit.   Subjective Assessment - 01/10/20 1235    Subjective No real changes.  No falls, no stumbles.  I'm ready to start the harder exercises.    Patient is accompained by: --    Pertinent History 86 y.o. male  with history of CAD (03/14/15 CABG  LIMA > LAD, SVG > 2nd Diag and OM 1/2 , also had LAA clipping ), HTN, HFpEF, symptomatic bradycardia w/PPM, uncontrolled rapid AFib, Atypical AFlutter now s/p AVN ablation, RVOT VT intolerant of amiodarone (patient states with sun sensitivity and visual changes), chronic LBP, right TKR.    Patient Stated Goals (Pt wants to get balance better)    Currently in Pain? No/denies                             [facility] Adult PT Treatment/Exercise - 01/10/20 0001      High Level Balance   High Level Balance Activities Other (comment)    High Level Balance Comments Toe walking x 3 reps in parallel bars;  toe walking x 3 reps in parallel bars, both with UE support.  Third rep with squat position at knees to help prevent posterior lean through trunk.               Balance Exercises - 01/10/20 0001      Balance Exercises: Standing   SLS with Vectors Upper extremity assist 2;Limitations;Foam/compliant surface    SLS with Vectors Limitations Alternating step taps to balance disks x 10 reps, then attempted with 1 UE support, cues for slowed pace for lifting.    Stepping Strategy Anterior;Posterior;Lateral;Foam/compliant surface;10 reps;UE support;Limitations    Stepping Strategy Limitations Standing on Airex, cues for foot clearance, step length    Rockerboard Anterior/posterior;Head turns;Limitations;Lateral    Rockerboard Limitations Anterior/posterior weightshifting for ankle strategy, hip  strategy work x 10 reps each, then balancing midline with head turns x 5, head nods x 5 reps; forward step taps then return to midline on board x 10 reps, then additional forward step taps with therapist assisting pt with increased tipping board forward prior to stepping x 10 reps.  Lateral weightshifting x 10 reps, then balancing midline on board with alt UE lifts x 10 reps, then head turns/head nods x 5 reps each.  Min guard/min assist for balance, as he has tendency for posterior lean in upper trunk.    Step Over Hurdles / Cones x 10 reps, alternating legs, step over flat 2" obstalce, with BUE support.   Progressed to performing alt step taps x 10 reps with 1 UE support.  Cues throughout for incredase step length     Marching Foam/compliant surface;Upper extremity assist 2;Static;10 reps    Marching Limitations Cues for widened BOS    Heel Raises Both;20 reps    Toe Raise Both;20 reps    Other Standing Exercises Stagger stance forward/back rocking x 10 reps, with pt needing cues for weightshifting through hips.  Pt has difficulty with sequencing weightshifting through ankles               PT Short  Term Goals - 01/05/20 1603      PT SHORT TERM GOAL #1   Title Pt will be independent with initial HEP for balance and fall prevention.    Baseline needs occasional cues for technique    Time 4    Period Weeks    Status Partially Met    Target Date 01/08/20      PT SHORT TERM GOAL #2   Title Pt will be able to ambulate >500' on level surfaces independently for improved household and community mobility.    Baseline supervision with rollator 800 ft    Time 4    Period Weeks    Status Partially Met    Target Date 01/08/20      PT SHORT TERM GOAL #3   Title FGA will be assessed and LTG written.    Baseline FGA score 12/13/2019 13/24    Time 4    Period Weeks    Status Achieved    Target Date 01/08/20             PT Long Term Goals - 12/13/19 2018      PT LONG TERM GOAL #1   Title Pt will be independent with progressive HEP for balance to continue gains on own.    Time 8    Period Weeks    Status New      PT LONG TERM GOAL #2   Title Pt will improve FGA to at least 18/30 for decreased fall risk.    Baseline 13/30    Time 8    Period Weeks    Status New      PT LONG TERM GOAL #3   Title Pt will ambulate >1000' on varied surfaces including sidewalk/grass/around obstacles independently for improved community mobility.    Time 8    Period Weeks    Status New      PT LONG TERM GOAL #4   Title Pt will increase gait speed to >1.69m/s for improved gait safety.    Baseline 12/08/19 0.71m/s    Time 8    Period Weeks    Status New                 Plan -  01/10/20 1416    Clinical Impression Statement Continued to work on balance for SLS and for compliant surface exercises in parallel bars today.  Worked on BUE support>lessening 1 UE support, with pt frequently reaching back to parallel bars to regain balance.  Pt has tendency for posterior lean through upper trunk at times, and has difficulty sequencing some novel tasks for balance today.  Pt will continue to benefit  from skilled PT to further address balance and gait activities for imrpoved overall functional mobility.    Personal Factors and Comorbidities Comorbidity 3+    Comorbidities CAD (03/14/15 CABG  LIMA > LAD, SVG > 2nd Diag and OM 1/2 , also had LAA clipping ), HTN, HFpEF, symptomatic bradycardia w/PPM, uncontrolled rapid AFib, Atypical AFlutter now s/p AVN ablation, RVOT VT intolerant of amiodarone (patient states with sun sensitivity and visual changes), chronic LBP    Examination-Activity Limitations Locomotion Level;Transfers;Stairs    Examination-Participation Restrictions Community Activity;Yard Work    [employer] Evolving/Moderate complexity    Rehab Potential Good    PT Frequency 2x / week   plus eval   PT Duration 8 weeks    PT Treatment/Interventions ADLs/Self Care Home Management;DME Instruction;Gait training;Stair training;Functional mobility training;Therapeutic activities;Therapeutic exercise;Balance training;Neuromuscular re-education;Manual techniques;Patient/family education;Vestibular    PT Next Visit Plan Next visit is 10th visit progress note.  Continue to work on balance, compliant surfaces, step length, SLS; progress HEP as appropriate.    Consulted and Agree with Plan of Care Patient    Family Member Consulted --           Patient will benefit from skilled therapeutic intervention in order to improve the following deficits and impairments:  Abnormal gait, Decreased endurance, Decreased strength, Decreased mobility, Decreased balance  Visit Diagnosis: Unsteadiness on feet  Other abnormalities of gait and mobility     Problem List Patient Active Problem List   Diagnosis Date Noted  . GERD (gastroesophageal reflux disease) 11/12/2019  . Heme positive stool 11/10/2019  . Anticoagulated by anticoagulation treatment 05/31/2019  . Epistaxis 05/31/2019  . Acute gouty arthritis 04/29/2019  . Left arm swelling 01/05/2018  . IBS (irritable bowel  syndrome) 06/30/2017  . Bilateral impacted cerumen 11/11/2016  . Imbalance 11/11/2016  . Encounter for well adult exam with abnormal findings 06/19/2016  . Increased prostate specific antigen (PSA) velocity 06/19/2016  . Gait disorder 01/27/2016  . Chronic anticoagulation 12/18/2015  . Hyperglycemia 06/17/2015  . CAD (coronary artery disease), native coronary artery 03/12/2015  . Atrial fibrillation with rapid ventricular response ([place])   . Solitary pulmonary nodule 05/24/2013  . Anxiety state 05/21/2012  . Chronic low back pain 05/21/2012  . Long term (current) use of anticoagulants 04/24/2012  . Hearing loss of both ears 11/11/2010  . Fatigue 11/16/2009  . VERTIGO 04/08/2007  . MELANOMA, MALIGNANT, SKIN NOS 09/29/2006  . Hyperlipidemia 09/29/2006  . ERECTILE DYSFUNCTION 09/29/2006  . VENTRICULAR TACHYCARDIA 09/29/2006  . BENIGN PROSTATIC HYPERTROPHY 09/29/2006  . OSTEOARTHROSIS NOS, OTHER SPEC SITE 09/29/2006    Frazier Butt. 01/10/2020, 2:21 PM  Frazier Butt., PT   [facility] [address] [place], [place], [address] Phone: [phone]   Fax:  [phone]  Name: Jonathan Orozco MRN: [id number] Date of Birth: 1928/06/13

## 2020-01-11 ENCOUNTER — Encounter: Payer: Self-pay | Admitting: Internal Medicine

## 2020-01-11 ENCOUNTER — Ambulatory Visit (INDEPENDENT_AMBULATORY_CARE_PROVIDER_SITE_OTHER): Payer: Medicare Other | Admitting: Internal Medicine

## 2020-01-11 VITALS — BP 140/100 | HR 89 | Temp 97.7°F | Ht 70.0 in | Wt 165.0 lb

## 2020-01-11 DIAGNOSIS — E782 Mixed hyperlipidemia: Secondary | ICD-10-CM

## 2020-01-11 DIAGNOSIS — R739 Hyperglycemia, unspecified: Secondary | ICD-10-CM | POA: Diagnosis not present

## 2020-01-11 DIAGNOSIS — I10 Essential (primary) hypertension: Secondary | ICD-10-CM | POA: Insufficient documentation

## 2020-01-11 DIAGNOSIS — G8929 Other chronic pain: Secondary | ICD-10-CM | POA: Diagnosis not present

## 2020-01-11 DIAGNOSIS — K219 Gastro-esophageal reflux disease without esophagitis: Secondary | ICD-10-CM

## 2020-01-11 LAB — BASIC METABOLIC PANEL
BUN: 10 mg/dL (ref 6–23)
CO2: 31 mEq/L (ref 19–32)
Calcium: 9.5 mg/dL (ref 8.4–10.5)
Chloride: 100 mEq/L (ref 96–112)
Creatinine, Ser: 0.9 mg/dL (ref 0.40–1.50)
GFR: 75.5 mL/min (ref >60.00)
Glucose, Bld: 98 mg/dL (ref 70–99)
Potassium: 4.6 mEq/L (ref 3.5–5.1)
Sodium: 136 mEq/L (ref 135–145)

## 2020-01-11 LAB — URINALYSIS, ROUTINE W REFLEX MICROSCOPIC
Bilirubin Urine: NEGATIVE
Hgb urine dipstick: NEGATIVE
Ketones, ur: NEGATIVE
Leukocytes,Ua: NEGATIVE
Nitrite: NEGATIVE
Specific Gravity, Urine: 1.01 (ref 1.000–1.030)
Total Protein, Urine: NEGATIVE
Urine Glucose: NEGATIVE
Urobilinogen, UA: 0.2 (ref 0.0–1.0)
pH: 6 (ref 5.0–8.0)

## 2020-01-11 LAB — LIPID PANEL
Cholesterol: 116 mg/dL (ref 0–200)
HDL: 56.1 mg/dL (ref >39.00)
LDL Cholesterol: 51 mg/dL (ref 0–99)
NonHDL: 59.64
Total CHOL/HDL Ratio: 2
Triglycerides: 42 mg/dL (ref 0.0–149.0)
VLDL: 8.4 mg/dL (ref 0.0–40.0)

## 2020-01-11 LAB — CBC WITH DIFFERENTIAL/PLATELET
Basophils Absolute: 0 10*3/uL (ref 0.0–0.1)
Basophils Relative: 0.3 % (ref 0.0–3.0)
Eosinophils Absolute: 0.1 10*3/uL (ref 0.0–0.7)
Eosinophils Relative: 1.8 % (ref 0.0–5.0)
HCT: 42.2 % (ref 39.0–52.0)
Hemoglobin: 14.3 g/dL (ref 13.0–17.0)
Lymphocytes Relative: 43.3 % (ref 12.0–46.0)
Lymphs Abs: 2.2 10*3/uL (ref 0.7–4.0)
MCHC: 34 g/dL (ref 30.0–36.0)
MCV: 95 fl (ref 78.0–100.0)
Monocytes Absolute: 0.6 10*3/uL (ref 0.1–1.0)
Monocytes Relative: 11.1 % (ref 3.0–12.0)
Neutro Abs: 2.3 10*3/uL (ref 1.4–7.7)
Neutrophils Relative %: 43.5 % (ref 43.0–77.0)
Platelets: 214 10*3/uL (ref 150.0–400.0)
RBC: 4.44 Mil/uL (ref 4.22–5.81)
RDW: 13.5 % (ref 11.5–15.5)
WBC: 5.2 10*3/uL (ref 4.0–10.5)

## 2020-01-11 LAB — HEPATIC FUNCTION PANEL
ALT: 21 U/L (ref 0–53)
AST: 27 U/L (ref 0–37)
Albumin: 4.2 g/dL (ref 3.5–5.2)
Alkaline Phosphatase: 101 U/L (ref 39–117)
Bilirubin, Direct: 0.3 mg/dL (ref 0.0–0.3)
Total Bilirubin: 1.3 mg/dL — ABNORMAL HIGH (ref 0.2–1.2)
Total Protein: 7.3 g/dL (ref 6.0–8.3)

## 2020-01-11 LAB — HEMOGLOBIN A1C: Hgb A1c MFr Bld: 5.8 % (ref 4.6–6.5)

## 2020-01-11 LAB — TSH: TSH: 0.79 u[IU]/mL (ref 0.35–4.50)

## 2020-01-11 MED ORDER — PANTOPRAZOLE SODIUM 40 MG PO TBEC: 40.0000 mg | DELAYED_RELEASE_TABLET | Freq: Every day | ORAL | 3 refills | Status: DC

## 2020-01-11 MED ORDER — HYDROCODONE-ACETAMINOPHEN 10-325 MG PO TABS: 1.0000 | ORAL_TABLET | Freq: Every day | ORAL | 0 refills | Status: DC | PRN

## 2020-01-11 MED ORDER — LOSARTAN POTASSIUM 50 MG PO TABS
50.0000 mg | ORAL_TABLET | Freq: Every day | ORAL | 3 refills | Status: DC
Start: 2020-01-11 — End: 2021-01-04

## 2020-01-11 NOTE — Progress Notes (Signed)
Subjective:    Patient ID: Jonathan Orozco, male    DOB: July 16, 1928, 86 y.o.   MRN: [id number]  HPI  Here to f/u; overall doing ok,  Pt denies chest pain, increasing sob or doe, wheezing, orthopnea, PND, increased LE swelling, palpitations, dizziness or syncope.  Pt denies new neurological symptoms such as new headache, or facial or extremity weakness or numbness.  Pt denies polydipsia, polyuria, or low sugar episode.  Pt states overall good compliance with meds, mostly trying to follow appropriate diet, with wt overall down several labs, he thinks due to worsening reflux despite good compliance with prilosec 20 mg.  Denies worsening other abd pain, dysphagia, n/v, bowel change or blood.  Has ongoing chronic pain, due for refill soon, needs uds. Wt Readings from Last 3 Encounters:  01/11/20 165 lb (74.8 kg)  11/10/19 168 lb 8 oz (76.4 kg)  07/14/19 171 lb (77.6 kg)   BP Readings from Last 3 Encounters:  01/11/20 (!) 140/100  01/05/20 (!) 147/82  01/03/20 (!) 166/82   Past Medical History:  Diagnosis Date  . ABUSE, ALCOHOL, IN REMISSION 04/08/2007  . ALLERGIC RHINITIS 09/29/2006  . Arthritis    "hands" (07/02/2016)  . Atrial fibrillation ([place])   . Atrial fibrillation with RVR ([place]) 07/02/2016  . Atrial flutter ([place]) 03/28/2010   a. s/p prior rfca;  b. recurrent paroxysmal flutter 04/2012;  c. pradaxa initiated 04/2012.  [name] BENIGN PROSTATIC HYPERTROPHY 09/29/2006  . BPPV (benign paroxysmal positional vertigo) 04/08/2007  . CAD (coronary artery disease)    a. LHC 2/17: EF 55-65%, LM 75, pLAD 75, oD1 75, oD2 65, D3 85, oLCx 99, oOM1 75, pRCA 25 >> CABG  . Carotid stenosis    a. Carotid US 2/17:  Bilateral ICA 1-39% ICA  . Chronic lower back pain   . COLONIC POLYPS, HX OF 06/23/2007  . 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 Diverticulosis   . ERECTILE DYSFUNCTION 09/29/2006  . GERD (gastroesophageal reflux disease)   . GLUCOSE INTOLERANCE 03/19/2010  . HYPERLIPIDEMIA 09/29/2006  .  HYPERTENSION 09/29/2006  . Long term (current) use of anticoagulants 04/26/2010  . MELANOMA, MALIGNANT, SKIN NOS 09/29/2006   other skin cancers -no further melanoma  . OSTEOARTHROSIS NOS, OTHER [facility] SITE 09/29/2006  . Other specified forms of hearing loss 08/10/2009  . Pneumonia ~ 1969  . Presence of permanent cardiac pacemaker 06/02/2016  . VENTRICULAR TACHYCARDIA 09/29/2006   Past Surgical History:  Procedure Laterality Date  . AV NODE ABLATION  07/02/2016  . AV NODE ABLATION N/A 07/02/2016   Procedure: AV Node Ablation;  Surgeon: Deboraha Sprang, MD;  Location: [place] CV LAB;  Service: Cardiovascular;  Laterality: N/A;  . CARDIAC CATHETERIZATION N/A 03/12/2015   Procedure: Left Heart Cath and Coronary Angiography;  Surgeon: [name], MD;  Location: [place] CV LAB;  Service: Cardiovascular;  Laterality: N/A;  . CARDIOVERSION N/A 12/19/2015   Procedure: CARDIOVERSION;  Surgeon: [name], MD;  Location: [facility] ENDOSCOPY;  Service: Cardiovascular;  Laterality: N/A;  . CARDIOVERSION N/A 03/24/2016   Procedure: CARDIOVERSION;  Surgeon: [name], MD;  Location: [place];  Service: Cardiovascular;  Laterality: N/A;  . CATARACT EXTRACTION W/ INTRAOCULAR LENS  IMPLANT, BILATERAL Bilateral   . CLIPPING OF ATRIAL APPENDAGE N/A 03/14/2015   Procedure: CLIPPING OF ATRIAL APPENDAGE;  Surgeon: [name], MD;  Location: [place];  Service: Open Heart Surgery;  Laterality: N/A;  . COLONOSCOPY WITH PROPOFOL N/A 12/08/2014   Procedure:  COLONOSCOPY WITH PROPOFOL;  Surgeon: [name], MD;  Location: [facility] ENDOSCOPY;  Service: Endoscopy;  Laterality: N/A;  . CORONARY ARTERY BYPASS GRAFT N/A 03/14/2015   Procedure: CORONARY ARTERY BYPASS GRAFTING (CABG) x 4 (LIMA to LAD, SVG to DIAGONAL 2, SVG SEQUENTIALLY to OM1 and OM2);  Surgeon: [name], MD;  Location: [place];  Service: Open Heart Surgery;  Laterality: N/A;  . INCISION AND DRAINAGE N/A 07/03/2016   Procedure: INCISION AND  DRAINAGE of CHEST ABSCESS;  Surgeon: [name], MD;  Location: [place];  Service: Thoracic;  Laterality: N/A;  . INSERT / REPLACE / REMOVE PACEMAKER  06/02/2016  . JOINT REPLACEMENT    . KNEE ARTHROSCOPY Left   . MELANOMA EXCISION     "upper back"  . PACEMAKER IMPLANT N/A 06/02/2016   Procedure: Pacemaker Implant;  Surgeon: Deboraha Sprang, MD;  Location: [place] CV LAB;  Service: Cardiovascular;  Laterality: N/A;  . SHOULDER OPEN ROTATOR CUFF REPAIR Left   . SKIN CANCER EXCISION Right    "cheek; real deep"  . TEE WITHOUT CARDIOVERSION N/A 03/14/2015   Procedure: TRANSESOPHAGEAL ECHOCARDIOGRAM (TEE);  Surgeon: [name], MD;  Location: [place];  Service: Open Heart Surgery;  Laterality: N/A;  . TEE WITHOUT CARDIOVERSION N/A 12/19/2015   Procedure: TRANSESOPHAGEAL ECHOCARDIOGRAM (TEE);  Surgeon: [name], MD;  Location: [place];  Service: Cardiovascular;  Laterality: N/A;  . TEE WITHOUT CARDIOVERSION N/A 03/24/2016   Procedure: TRANSESOPHAGEAL ECHOCARDIOGRAM (TEE);  Surgeon: [name], MD;  Location: [place];  Service: Cardiovascular;  Laterality: N/A;  . TONSILLECTOMY    . TOTAL KNEE ARTHROPLASTY Right     reports that he has quit smoking. His smoking use included cigarettes. He has a 52.00 pack-year smoking history. He has never used smokeless tobacco. He reports that he does not drink alcohol and does not use drugs. family history includes Diabetes in his brother; Esophageal cancer in his father. Allergies  Allergen Reactions  . Amiodarone Hcl Other (See Comments)    Patient reports photosensitivity  . Ace Inhibitors Cough   Current Outpatient Medications on File Prior to Visit  Medication Sig Dispense Refill  . atorvastatin (LIPITOR) 10 MG tablet Take 1 tablet (10 mg total) by mouth daily. 90 tablet 3  . b complex vitamins tablet Take 1 tablet by mouth daily.    [name] doxylamine, Sleep, (UNISOM) 25 MG tablet Take 25 mg by mouth at bedtime as  needed for sleep.     [name] ELIQUIS 5 MG TABS tablet Take 1 tablet (5 mg total) by mouth 2 (two) times daily. 180 tablet 1  . Multiple Vitamin (MULTIVITAMIN) tablet Take 1 tablet by mouth daily.      [name] omega-3 acid ethyl esters (LOVAZA) 1 g capsule Take by mouth.    . Psyllium (METAMUCIL PO) Take 15 mLs by mouth 3 (three) times daily as needed (constipation).     . tamsulosin (FLOMAX) 0.4 MG CAPS capsule Take 1 capsule (0.4 mg total) by mouth daily. 90 capsule 3   No current facility-administered medications on file prior to visit.   Review of Systems All otherwise neg per pt    Objective:   Physical Exam BP (!) 140/100 (BP Location: Left Arm, Patient Position: Sitting, Cuff Size: Large)   Pulse 89   Temp 97.7 F (36.5 C) (Oral)   Ht 5\' 10"  (1.778 m)   Wt 165 lb (74.8 kg)   SpO2 98%   BMI 23.68 kg/m  VS  noted,  Constitutional: Pt appears in NAD HENT: Head: NCAT.  Right Ear: External ear normal.  Left Ear: External ear normal.  Eyes: . Pupils are equal, round, and reactive to light. Conjunctivae and EOM are normal Nose: without d/c or deformity Neck: Neck supple. Gross normal ROM Cardiovascular: Normal rate and regular rhythm.   Pulmonary/Chest: Effort normal and breath sounds without rales or wheezing.  Abd:  Soft, NT, ND, + BS, no organomegaly Neurological: Pt is alert. At baseline orientation, motor grossly intact Skin: Skin is warm. No rashes, other new lesions, no LE edema Psychiatric: Pt behavior is normal without agitation  All otherwise neg per pt Lab Results  Component Value Date   WBC 5.2 01/11/2020   HGB 14.3 01/11/2020   HCT 42.2 01/11/2020   PLT 214.0 01/11/2020   GLUCOSE 98 01/11/2020   CHOL 116 01/11/2020   TRIG 42.0 01/11/2020   HDL 56.10 01/11/2020   LDLDIRECT 159.2 09/29/2006   LDLCALC 51 01/11/2020   ALT 21 01/11/2020   AST 27 01/11/2020   NA 136 01/11/2020   K 4.6 01/11/2020   CL 100 01/11/2020   CREATININE 0.90 01/11/2020   BUN 10 01/11/2020    CO2 31 01/11/2020   TSH 0.79 01/11/2020   PSA 3.75 06/12/2016   INR 3.1 (A) 06/01/2018   HGBA1C 5.8 01/11/2020      Assessment & Plan:

## 2020-01-11 NOTE — Patient Instructions (Addendum)
Please take all new medication as prescribed - the losartan 50 mg per day for blood pressure, and the protonix for acid reflux  Please continue all other medications as before, and refills have been done if requested - the pain medication  Your pain contract was signed today  Please have the pharmacy call with any other refills you may need.  Please continue your efforts at being more active, low cholesterol diet, and weight control.  Please keep your appointments with your specialists as you may have planned  Please go to the LAB at the blood drawing area for the tests to be done  You will be contacted by phone if any changes need to be made immediately.  Otherwise, you will receive a letter about your results with an explanation, but please check with MyChart first.  Please remember to sign up for MyChart if you have not done so, as this will be important to you in the future with finding out test results, communicating by private email, and scheduling acute appointments online when needed.  Please make an Appointment to return in 3 months

## 2020-01-13 ENCOUNTER — Ambulatory Visit: Payer: Medicare Other

## 2020-01-13 LAB — DRUG SCREEN 764883 11+OXYCO+ALC+CRT-BUND
Amphetamines, Urine: NEGATIVE ng/mL
BENZODIAZ UR QL: NEGATIVE ng/mL
Barbiturate: NEGATIVE ng/mL
Cannabinoid Quant, Ur: NEGATIVE ng/mL
Cocaine (Metabolite): NEGATIVE ng/mL
Creatinine: 58.8 mg/dL (ref 20.0–300.0)
Ethanol: NEGATIVE %
Meperidine: NEGATIVE ng/mL
Methadone Screen, Urine: NEGATIVE ng/mL
OPIATE SCREEN URINE: NEGATIVE ng/mL
Oxycodone/Oxymorphone, Urine: NEGATIVE ng/mL
Phencyclidine: NEGATIVE ng/mL
Propoxyphene: NEGATIVE ng/mL
Tramadol: NEGATIVE ng/mL
pH, Urine: 5.9 (ref 4.5–8.9)

## 2020-01-14 ENCOUNTER — Encounter: Payer: Self-pay | Admitting: Internal Medicine

## 2020-01-14 DIAGNOSIS — G8929 Other chronic pain: Secondary | ICD-10-CM | POA: Insufficient documentation

## 2020-01-14 NOTE — Assessment & Plan Note (Signed)
stable overall by history and exam, recent data reviewed with pt, and pt to continue medical treatment as before,  to f/u any worsening symptoms or concerns  

## 2020-01-14 NOTE — Assessment & Plan Note (Signed)
Indication for chronic opioid: chronic lbp Medication and dose: vicodin 10 325 # pills per month: 30 Last UDS date: today Opioid Treatment Agreement signed (Y/N): yes Opioid Treatment Agreement last reviewed with patient:  yes NCCSRS reviewed this encounter (include red flags):  yes  I spent 41 minutes in preparing to see the patient by review of recent labs, imaging and procedures, obtaining and reviewing separately obtained history, communicating with the patient and family or caregiver, ordering medications, tests or procedures, and documenting clinical information in the EHR including the differential Dx, treatment, and any further evaluation and other management of chronic pain, gerd, htn, hld, hyperglycemia

## 2020-01-14 NOTE — Assessment & Plan Note (Signed)
For change prilosec 20 to protonix 40 qd

## 2020-01-14 NOTE — Assessment & Plan Note (Addendum)
To add losartan 50 mg daily, and f/u lab  I spent 31 minutes in preparing to see the patient by review of recent labs, imaging and procedures, obtaining and reviewing separately obtained history, communicating with the patient and family or caregiver, ordering medications, tests or procedures, and documenting clinical information in the EHR including the differential Dx, treatment, and any further evaluation and other management of htn, hld, hyperglycemia, gerd

## 2020-01-17 ENCOUNTER — Ambulatory Visit: Payer: Medicare Other | Admitting: Physical Therapy

## 2020-01-19 ENCOUNTER — Ambulatory Visit: Payer: Medicare Other

## 2020-01-24 ENCOUNTER — Ambulatory Visit: Payer: Medicare Other

## 2020-01-26 ENCOUNTER — Ambulatory Visit: Payer: Medicare Other

## 2020-01-31 ENCOUNTER — Ambulatory Visit: Payer: Medicare Other

## 2020-02-02 ENCOUNTER — Ambulatory Visit: Payer: Medicare Other

## 2020-02-09 ENCOUNTER — Telehealth: Payer: Self-pay | Admitting: Pharmacist

## 2020-02-09 NOTE — Telephone Encounter (Signed)
Called pharmacy for prescription insurance info.  [employer] ID [id number] BIN [id number] PCN [id number]  Prior authorization for Eliquis has been submitted.

## 2020-02-13 NOTE — Telephone Encounter (Signed)
Left message for pt that Eliquis authorization has been approved through 02/09/21.

## 2020-02-20 ENCOUNTER — Ambulatory Visit (INDEPENDENT_AMBULATORY_CARE_PROVIDER_SITE_OTHER): Payer: Medicare Other

## 2020-02-20 DIAGNOSIS — I480 Paroxysmal atrial fibrillation: Secondary | ICD-10-CM | POA: Diagnosis not present

## 2020-02-22 LAB — CUP PACEART REMOTE DEVICE CHECK
Battery Remaining Longevity: 95 mo
Battery Voltage: 3 V
Brady Statistic RV Percent Paced: 99.89 %
Date Time Interrogation Session: 20220118135456
Implantable Lead Implant Date: 20180430
Implantable Lead Location: 753860
Implantable Lead Model: 5076
Implantable Pulse Generator Implant Date: 20180430
Lead Channel Impedance Value: 380 Ohm
Lead Channel Impedance Value: 437 Ohm
Lead Channel Pacing Threshold Amplitude: 0.625 V
Lead Channel Pacing Threshold Pulse Width: 0.4 ms
Lead Channel Sensing Intrinsic Amplitude: 5 mV
Lead Channel Sensing Intrinsic Amplitude: 5 mV
Lead Channel Setting Pacing Amplitude: 2.5 V
Lead Channel Setting Pacing Pulse Width: 0.4 ms
Lead Channel Setting Sensing Sensitivity: 1.2 mV

## 2020-03-05 NOTE — Progress Notes (Signed)
Remote pacemaker transmission.   

## 2020-03-13 ENCOUNTER — Other Ambulatory Visit: Payer: Self-pay | Admitting: Internal Medicine

## 2020-03-13 MED ORDER — HYDROCODONE-ACETAMINOPHEN 10-325 MG PO TABS: 1.0000 | ORAL_TABLET | Freq: Every day | ORAL | 0 refills | Status: DC | PRN

## 2020-03-13 NOTE — Telephone Encounter (Signed)
pmpaware reviewed and low risk

## 2020-03-21 ENCOUNTER — Telehealth: Payer: Self-pay

## 2020-03-21 NOTE — Telephone Encounter (Signed)
Will route to PharmD for rec's re: holding anticoagulation. Richardson Dopp, PA-C    03/21/2020 3:19 PM

## 2020-03-21 NOTE — Telephone Encounter (Signed)
   Primary Cardiologist: Larae Grooms, MD  Chart reviewed as part of pre-operative protocol coverage.   Hx:  CAD s/p CABG in 2017, HFpEF, parox AFib/AFlutter, uncontrolled VR s/p AVN ablation, symptomatic brady s/p pacemaker, HTN, HLD, RVOT Ventricular Tachy,   Last seen by Dr. Irish Lack:  07/01/19  RCRI:  Perioperative Risk of Major Cardiac Event is (%): 6.6 (high risk) DASI:  Functional Capacity in METs is: 4.31 (functional status is fair ) CHA2DS2-VASc Score = 5 [CHF History: Yes, HTN History: Yes, Diabetes History: No, Stroke History: No, Vascular Disease History: Yes, Age Score: 2, Gender Score: 0].    Patient was contacted 03/21/2020 in reference to pre-operative risk assessment for pending surgery as outlined below.  His wife provided the information.  DPR is on file (05/24/19) - Lyn Henri.  Since last seen, BRIDGET WESTBROOKS has done well without chest pain, shortness of breath.  Recommendations:  Therefore, based on ACC/AHA guidelines, the patient would be at acceptable risk for the planned procedure without further cardiovascular testing.   The patient may hold his apixaban (Eliquis) for 2 days prior to his procedure.  It should be resumed as soon as possible after the procedure once it is felt to be safe from a bleeding perspective   Please call with questions. Richardson Dopp, PA-C 03/21/2020, 5:47 PM

## 2020-03-21 NOTE — Telephone Encounter (Signed)
   [facility] Pre-operative Risk Assessment    HEARTCARE STAFF: - Please ensure there is not already an duplicate clearance open for this procedure. - Under Visit Info/Reason for Call, type in Other and utilize the format Clearance MM/DD/YY or Clearance TBD. Do not use dashes or single digits. - If request is for dental extraction, please clarify the # of teeth to be extracted.  Request for surgical clearance:  1. What type of surgery is being performed? EGD and colonoscopy   2. When is this surgery scheduled? 04/06/2020   3. What type of clearance is required (medical clearance vs. Pharmacy clearance to hold med vs. Both)? both  4. Are there any medications that need to be held prior to surgery and how long? Eliquis   5. Practice name and name of physician performing surgery? [facility], Dr. Benson Norway   6. What is the office phone number? [phone]   7.   What is the office fax number? [phone]  8.   Anesthesia type (None, local, MAC, general) ? Propofol    Wheeler Incorvaia R Ihor Meinzer 03/21/2020, 2:37 PM  _________________________________________________________________   (provider comments below)

## 2020-03-21 NOTE — Telephone Encounter (Signed)
Patient with diagnosis of afib on Eliquis for anticoagulation.    Procedure: EGD and colonoscopy Date of procedure: 04/06/20  CHA2DS2-VASc Score = 5  This indicates a 7.2% annual risk of stroke. The patient's score is based upon: CHF History: Yes HTN History: Yes Diabetes History: No Stroke History: No Vascular Disease History: Yes Age Score: 2 Gender Score: 0     CrCl 56 ml/min  Per office protocol, patient can hold Eliquis for 2 days prior to procedure.

## 2020-03-21 NOTE — Telephone Encounter (Signed)
Notes faxed to surgeon. This phone note will be removed from the preop pool. Richardson Dopp, PA-C  03/21/2020 5:48 PM

## 2020-03-22 ENCOUNTER — Other Ambulatory Visit: Payer: Self-pay | Admitting: Gastroenterology

## 2020-03-28 ENCOUNTER — Other Ambulatory Visit: Payer: Self-pay

## 2020-03-28 ENCOUNTER — Ambulatory Visit (INDEPENDENT_AMBULATORY_CARE_PROVIDER_SITE_OTHER): Payer: Medicare Other | Admitting: Internal Medicine

## 2020-03-28 ENCOUNTER — Encounter: Payer: Self-pay | Admitting: Internal Medicine

## 2020-03-28 DIAGNOSIS — E782 Mixed hyperlipidemia: Secondary | ICD-10-CM

## 2020-03-28 DIAGNOSIS — R739 Hyperglycemia, unspecified: Secondary | ICD-10-CM | POA: Diagnosis not present

## 2020-03-28 DIAGNOSIS — I10 Essential (primary) hypertension: Secondary | ICD-10-CM

## 2020-03-28 DIAGNOSIS — H6123 Impacted cerumen, bilateral: Secondary | ICD-10-CM | POA: Diagnosis not present

## 2020-03-28 DIAGNOSIS — Z0000 Encounter for general adult medical examination without abnormal findings: Secondary | ICD-10-CM | POA: Diagnosis not present

## 2020-03-28 DIAGNOSIS — Z0001 Encounter for general adult medical examination with abnormal findings: Secondary | ICD-10-CM

## 2020-03-28 NOTE — Assessment & Plan Note (Signed)
borderiine elevated, declines change in tx at this time BP Readings from Last 3 Encounters:  03/28/20 (!) 140/92  01/11/20 (!) 140/100  01/05/20 (!) 147/82   Stable, pt to continue medical treatment  - losartan 50

## 2020-03-28 NOTE — Assessment & Plan Note (Signed)
Lab Results  Component Value Date   LDLCALC 51 01/11/2020   Stable, pt to continue current statin  - lipitor 10

## 2020-03-28 NOTE — Assessment & Plan Note (Signed)

## 2020-03-28 NOTE — Progress Notes (Signed)
Patient ID: Jonathan Orozco, male   DOB: Mar 09, 1928, 87 y.o.   MRN: [id number]         Chief Complaint:: wellness exam and Cerumen Impaction  with bilateral hearing reduced, HTN, hyperglycemia       HPI:  Jonathan Orozco is a 87 y.o. male here for wellness exam, up to date with immunizations and preventive referrals                        Also BP at home has been borderline, maybe mild lower than 140/90 overall.  Pt denies chest pain, increased sob or doe, wheezing, orthopnea, PND, increased LE swelling, palpitations, dizziness or syncope.   Pt denies polydipsia, polyuria, Denies worsening neuro s/s.   Pt denies fever, wt loss, night sweats, loss of appetite, or other constitutional symptoms  Also has bilateral hearing reduced for 1 wk with wax impacations, asks for irrigation today   Wt Readings from Last 3 Encounters:  03/28/20 174 lb (78.9 kg)  01/11/20 165 lb (74.8 kg)  11/10/19 168 lb 8 oz (76.4 kg)   BP Readings from Last 3 Encounters:  03/28/20 (!) 140/92  01/11/20 (!) 140/100  01/05/20 (!) 147/82   Immunization History  Administered Date(s) Administered  . Fluad Quad(high Dose 65+) 10/08/2018, 11/10/2019  . H1N1 03/14/2008  . Influenza Split 11/11/2010, 10/10/2011  . Influenza Whole 01/20/2007, 11/02/2007, 03/16/2009, 11/16/2009  . Influenza, High Dose Seasonal PF 10/21/2013, 11/20/2015, 11/25/2016, 11/17/2017  . Influenza,inj,Quad PF,6+ Mos 11/09/2012  . Influenza-Unspecified 11/24/2014  . PFIZER(Purple Top)SARS-COV-2 Vaccination 03/17/2019, 04/11/2019, 11/22/2019  . Pneumococcal Conjugate-13 05/24/2013  . Pneumococcal Polysaccharide-23 03/31/2006  . Td 03/31/2006  . Tdap 06/19/2016  There are no preventive care reminders to display for this patient.    Past Medical History:  Diagnosis Date  . ABUSE, ALCOHOL, IN REMISSION 04/08/2007  . ALLERGIC RHINITIS 09/29/2006  . Arthritis    "hands" (07/02/2016)  . Atrial fibrillation ([place])   . Atrial fibrillation with RVR ([place])  07/02/2016  . Atrial flutter ([place]) 03/28/2010   a. s/p prior rfca;  b. recurrent paroxysmal flutter 04/2012;  c. pradaxa initiated 04/2012.  [name] BENIGN PROSTATIC HYPERTROPHY 09/29/2006  . BPPV (benign paroxysmal positional vertigo) 04/08/2007  . CAD (coronary artery disease)    a. LHC 2/17: EF 55-65%, LM 75, pLAD 75, oD1 75, oD2 65, D3 85, oLCx 99, oOM1 75, pRCA 25 >> CABG  . Carotid stenosis    a. Carotid US 2/17:  Bilateral ICA 1-39% ICA  . Chronic lower back pain   . COLONIC POLYPS, HX OF 06/23/2007  . 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 Diverticulosis   . ERECTILE DYSFUNCTION 09/29/2006  . GERD (gastroesophageal reflux disease)   . GLUCOSE INTOLERANCE 03/19/2010  . HYPERLIPIDEMIA 09/29/2006  . HYPERTENSION 09/29/2006  . Long term (current) use of anticoagulants 04/26/2010  . MELANOMA, MALIGNANT, SKIN NOS 09/29/2006   other skin cancers -no further melanoma  . OSTEOARTHROSIS NOS, OTHER [facility] SITE 09/29/2006  . Other specified forms of hearing loss 08/10/2009  . Pneumonia ~ 1969  . Presence of permanent cardiac pacemaker 06/02/2016  . VENTRICULAR TACHYCARDIA 09/29/2006   Past Surgical History:  Procedure Laterality Date  . AV NODE ABLATION  07/02/2016  . AV NODE ABLATION N/A 07/02/2016   Procedure: AV Node Ablation;  Surgeon: Deboraha Sprang, MD;  Location: [place] [facility];  Service: Cardiovascular;  Laterality: N/A;  . CARDIAC CATHETERIZATION N/A 03/12/2015   Procedure: Left Heart  Cath and Coronary Angiography;  Surgeon: [name], MD;  Location: [place] [facility];  Service: Cardiovascular;  Laterality: N/A;  . CARDIOVERSION N/A 12/19/2015   Procedure: CARDIOVERSION;  Surgeon: [name], MD;  Location: [facility];  Service: Cardiovascular;  Laterality: N/A;  . CARDIOVERSION N/A 03/24/2016   Procedure: CARDIOVERSION;  Surgeon: [name], MD;  Location: [place];  Service: Cardiovascular;  Laterality: N/A;  . CATARACT EXTRACTION W/ INTRAOCULAR LENS  IMPLANT,  BILATERAL Bilateral   . CLIPPING OF ATRIAL APPENDAGE N/A 03/14/2015   Procedure: CLIPPING OF ATRIAL APPENDAGE;  Surgeon: [name], MD;  Location: [place];  Service: Open Heart Surgery;  Laterality: N/A;  . COLONOSCOPY WITH PROPOFOL N/A 12/08/2014   Procedure: COLONOSCOPY WITH PROPOFOL;  Surgeon: [name], MD;  Location: [facility];  Service: Endoscopy;  Laterality: N/A;  . CORONARY ARTERY BYPASS GRAFT N/A 03/14/2015   Procedure: CORONARY ARTERY BYPASS GRAFTING (CABG) x 4 (LIMA to LAD, SVG to DIAGONAL 2, SVG SEQUENTIALLY to OM1 and OM2);  Surgeon: [name], MD;  Location: [place];  Service: Open Heart Surgery;  Laterality: N/A;  . INCISION AND DRAINAGE N/A 07/03/2016   Procedure: INCISION AND DRAINAGE of CHEST ABSCESS;  Surgeon: [name], MD;  Location: [place];  Service: Thoracic;  Laterality: N/A;  . INSERT / REPLACE / REMOVE PACEMAKER  06/02/2016  . JOINT REPLACEMENT    . KNEE ARTHROSCOPY Left   . MELANOMA EXCISION     "upper back"  . PACEMAKER IMPLANT N/A 06/02/2016   Procedure: Pacemaker Implant;  Surgeon: Deboraha Sprang, MD;  Location: [place] [facility];  Service: Cardiovascular;  Laterality: N/A;  . SHOULDER OPEN ROTATOR CUFF REPAIR Left   . SKIN CANCER EXCISION Right    "cheek; real deep"  . TEE WITHOUT CARDIOVERSION N/A 03/14/2015   Procedure: TRANSESOPHAGEAL ECHOCARDIOGRAM (TEE);  Surgeon: [name], MD;  Location: [place];  Service: Open Heart Surgery;  Laterality: N/A;  . TEE WITHOUT CARDIOVERSION N/A 12/19/2015   Procedure: TRANSESOPHAGEAL ECHOCARDIOGRAM (TEE);  Surgeon: [name], MD;  Location: [place];  Service: Cardiovascular;  Laterality: N/A;  . TEE WITHOUT CARDIOVERSION N/A 03/24/2016   Procedure: TRANSESOPHAGEAL ECHOCARDIOGRAM (TEE);  Surgeon: [name], MD;  Location: [place];  Service: Cardiovascular;  Laterality: N/A;  . TONSILLECTOMY    . TOTAL KNEE ARTHROPLASTY Right     reports that he has quit smoking.  His smoking use included cigarettes. He has a 52.00 pack-year smoking history. He has never used smokeless tobacco. He reports that he does not drink alcohol and does not use drugs. family history includes Diabetes in his brother; Esophageal cancer in his father. Allergies  Allergen Reactions  . Amiodarone Hcl Other (See Comments)    Patient reports photosensitivity  . Ace Inhibitors Cough   Current Outpatient Medications on File Prior to Visit  Medication Sig Dispense Refill  . atorvastatin (LIPITOR) 10 MG tablet Take 1 tablet (10 mg total) by mouth daily. 90 tablet 3  . b complex vitamins tablet Take 1 tablet by mouth daily.    [name] doxylamine, Sleep, (UNISOM) 25 MG tablet Take 25 mg by mouth at bedtime as needed for sleep.     [name] ELIQUIS 5 MG TABS tablet Take 1 tablet (5 mg total) by mouth 2 (two) times daily. 180 tablet 1  . HYDROcodone-acetaminophen (NORCO) 10-325 MG tablet Take 1 tablet by mouth daily as needed. 90 tablet 0  . losartan (COZAAR) 50  MG tablet Take 1 tablet (50 mg total) by mouth daily. 90 tablet 3  . Multiple Vitamin (MULTIVITAMIN) tablet Take 1 tablet by mouth daily.    [name] omega-3 acid ethyl esters (LOVAZA) 1 g capsule Take by mouth.    . pantoprazole (PROTONIX) 40 MG tablet Take 1 tablet (40 mg total) by mouth daily. 90 tablet 3  . Psyllium (METAMUCIL PO) Take 15 mLs by mouth 3 (three) times daily as needed (constipation).     . tamsulosin (FLOMAX) 0.4 MG CAPS capsule Take 1 capsule (0.4 mg total) by mouth daily. 90 capsule 3   No current facility-administered medications on file prior to visit.        ROS:  All others reviewed and negative.  Objective        PE:  BP (!) 140/92   Pulse 84   Temp 97.9 F (36.6 C) (Oral)   Ht 5\' 10"  (1.778 m)   Wt 174 lb (78.9 kg)   SpO2 99%   BMI 24.97 kg/m                 Constitutional: Pt appears in NAD               HENT: Head: NCAT.                Right Ear: External ear normal.                 Left Ear: External ear  normal.                Eyes: . Pupils are equal, round, and reactive to light. Conjunctivae and EOM are normal               Nose: without d/c or deformity               Neck: Neck supple. Gross normal ROM               Cardiovascular: Normal rate and regular rhythm.                 Pulmonary/Chest: Effort normal and breath sounds without rales or wheezing.                Abd:  Soft, NT, ND, + BS, no organomegaly               Neurological: Pt is alert. At baseline orientation, motor grossly intact               Skin: Skin is warm. No rashes, no other new lesions, LE edema - none               Psychiatric: Pt behavior is normal without agitation   Micro: none  Cardiac tracings I have personally interpreted today:  none  Pertinent Radiological findings (summarize): none   Lab Results  Component Value Date   WBC 5.2 01/11/2020   HGB 14.3 01/11/2020   HCT 42.2 01/11/2020   PLT 214.0 01/11/2020   GLUCOSE 98 01/11/2020   CHOL 116 01/11/2020   TRIG 42.0 01/11/2020   HDL 56.10 01/11/2020   LDLDIRECT 159.2 09/29/2006   LDLCALC 51 01/11/2020   ALT 21 01/11/2020   AST 27 01/11/2020   NA 136 01/11/2020   K 4.6 01/11/2020   CL 100 01/11/2020   CREATININE 0.90 01/11/2020   BUN 10 01/11/2020   CO2 31 01/11/2020   TSH 0.79 01/11/2020   PSA 3.75 06/12/2016   INR 3.1 (  A) 06/01/2018   HGBA1C 5.8 01/11/2020   Assessment/Plan:  Jonathan Orozco is a 87 y.o. White or Caucasian [1] male with  has a past medical history of ABUSE, ALCOHOL, IN REMISSION (04/08/2007), ALLERGIC RHINITIS (09/29/2006), Arthritis, Atrial fibrillation ([place]), Atrial fibrillation with RVR ([place]) (07/02/2016), Atrial flutter ([place]) (03/28/2010), BENIGN PROSTATIC HYPERTROPHY (09/29/2006), BPPV (benign paroxysmal positional vertigo) (04/08/2007), CAD (coronary artery disease), Carotid stenosis, Chronic lower back pain, COLONIC POLYPS, HX OF (06/23/2007), DISORDERS, ORGANIC INSOMNIA NOS (09/29/2006), Diverticulosis, ERECTILE DYSFUNCTION  (09/29/2006), GERD (gastroesophageal reflux disease), GLUCOSE INTOLERANCE (03/19/2010), HYPERLIPIDEMIA (09/29/2006), HYPERTENSION (09/29/2006), Long term (current) use of anticoagulants (04/26/2010), MELANOMA, MALIGNANT, SKIN NOS (09/29/2006), OSTEOARTHROSIS NOS, OTHER SPEC SITE (09/29/2006), Other specified forms of hearing loss (08/10/2009), Pneumonia (~ 1969), Presence of permanent cardiac pacemaker (06/02/2016), and VENTRICULAR TACHYCARDIA (09/29/2006).  Encounter for well adult exam with abnormal findings Age and sex appropriate education and counseling updated with regular exercise and diet Referrals for preventative services - none needed Immunizations addressed - none needed Smoking counseling  - none needed Evidence for depression or other mood disorder - none significant Most recent labs reviewed. I have personally reviewed and have noted: 1) the patient's medical and social history 2) The patient's current medications and supplements 3) The patient's height, weight, and BMI have been recorded in the chart   Hypertension, uncontrolled borderiine elevated, declines change in tx at this time BP Readings from Last 3 Encounters:  03/28/20 (!) 140/92  01/11/20 (!) 140/100  01/05/20 (!) 147/82   Stable, pt to continue medical treatment  - losartan 50  Hyperglycemia Lab Results  Component Value Date   HGBA1C 5.8 01/11/2020   Stable, pt to continue current medical treatment  - diet, declines further lab today  Hyperlipidemia Lab Results  Component Value Date   LDLCALC 51 01/11/2020   Stable, pt to continue current statin  - lipitor 10   Bilateral impacted cerumen Resolved with irrigation, hearing improved,  to f/u any worsening symptoms or concerns  Followup: Return in about 6 months (around 09/25/2020).  [name], MD 03/28/2020 9:41 PM [facility]

## 2020-03-28 NOTE — Patient Instructions (Signed)
Your ears were irrigated today  Please continue all other medications as before, and refills have been done if requested.  Please have the pharmacy call with any other refills you may need.  Please continue your efforts at being more active, low cholesterol diet, and weight control.  You are otherwise up to date with prevention measures today.  Please keep your appointments with your specialists as you may have planned  Please make an Appointment to return in 6 months, or sooner if needed

## 2020-03-28 NOTE — Assessment & Plan Note (Signed)
Resolved with irrigation, hearing improved,  to f/u any worsening symptoms or concerns

## 2020-03-28 NOTE — Assessment & Plan Note (Signed)
Lab Results  Component Value Date   HGBA1C 5.8 01/11/2020   Stable, pt to continue current medical treatment  - diet, declines further lab today

## 2020-04-06 ENCOUNTER — Ambulatory Visit (HOSPITAL_COMMUNITY): Admit: 2020-04-06 | Payer: Medicare Other | Admitting: Gastroenterology

## 2020-04-06 ENCOUNTER — Encounter (HOSPITAL_COMMUNITY): Payer: Self-pay

## 2020-04-06 SURGERY — ESOPHAGOGASTRODUODENOSCOPY (EGD) WITH PROPOFOL
Anesthesia: Monitor Anesthesia Care

## 2020-04-10 ENCOUNTER — Other Ambulatory Visit: Payer: Self-pay | Admitting: Internal Medicine

## 2020-04-11 ENCOUNTER — Telehealth: Payer: Self-pay | Admitting: Internal Medicine

## 2020-04-13 MED ORDER — HYDROCODONE-ACETAMINOPHEN 10-325 MG PO TABS: 1.0000 | ORAL_TABLET | Freq: Every day | ORAL | 0 refills | Status: DC | PRN

## 2020-04-13 NOTE — Telephone Encounter (Signed)
   Please return call to spouse regarding hydrocodone-acetaminophen 10-325MG

## 2020-04-16 ENCOUNTER — Ambulatory Visit: Payer: Medicare Other | Admitting: Internal Medicine

## 2020-04-16 NOTE — Telephone Encounter (Signed)
Patients wife calling, states the pharmacy still has not got the medication

## 2020-04-17 MED ORDER — HYDROCODONE-ACETAMINOPHEN 10-325 MG PO TABS: 1.0000 | ORAL_TABLET | Freq: Every day | ORAL | 0 refills | Status: DC | PRN

## 2020-04-17 NOTE — Addendum Note (Signed)
Addended by: Biagio Borg on: 04/17/2020 12:40 PM   Modules accepted: Orders

## 2020-04-30 ENCOUNTER — Other Ambulatory Visit: Payer: Self-pay | Admitting: Pharmacist

## 2020-04-30 MED ORDER — ELIQUIS 5 MG PO TABS: 5.0000 mg | ORAL_TABLET | Freq: Two times a day (BID) | ORAL | 1 refills | Status: DC

## 2020-05-02 ENCOUNTER — Telehealth: Payer: Self-pay

## 2020-05-02 NOTE — Telephone Encounter (Signed)
The patient wife states he will be getting a new phone. I gave her the number to [facility] support in case the phone is not compatible with the app. I told her if the phone is not compatible then tech support can give him a monitor.

## 2020-05-18 ENCOUNTER — Other Ambulatory Visit: Payer: Self-pay | Admitting: Internal Medicine

## 2020-05-21 ENCOUNTER — Ambulatory Visit (INDEPENDENT_AMBULATORY_CARE_PROVIDER_SITE_OTHER): Payer: Medicare Other

## 2020-05-21 DIAGNOSIS — I480 Paroxysmal atrial fibrillation: Secondary | ICD-10-CM

## 2020-05-21 MED ORDER — HYDROCODONE-ACETAMINOPHEN 10-325 MG PO TABS: 1.0000 | ORAL_TABLET | Freq: Every day | ORAL | 0 refills | Status: DC | PRN

## 2020-05-23 LAB — CUP PACEART REMOTE DEVICE CHECK
Battery Remaining Longevity: 96 mo
Battery Voltage: 3 V
Brady Statistic RV Percent Paced: 99.91 %
Date Time Interrogation Session: 20220417235223
Implantable Lead Implant Date: 20180430
Implantable Lead Location: 753860
Implantable Lead Model: 5076
Implantable Pulse Generator Implant Date: 20180430
Lead Channel Impedance Value: 361 Ohm
Lead Channel Impedance Value: 475 Ohm
Lead Channel Pacing Threshold Amplitude: 0.625 V
Lead Channel Pacing Threshold Pulse Width: 0.4 ms
Lead Channel Sensing Intrinsic Amplitude: 5.125 mV
Lead Channel Sensing Intrinsic Amplitude: 5.125 mV
Lead Channel Setting Pacing Amplitude: 2.5 V
Lead Channel Setting Pacing Pulse Width: 0.4 ms
Lead Channel Setting Sensing Sensitivity: 1.2 mV

## 2020-06-06 NOTE — Progress Notes (Signed)
Remote pacemaker transmission.   

## 2020-06-22 ENCOUNTER — Other Ambulatory Visit: Payer: Self-pay | Admitting: Internal Medicine

## 2020-06-22 MED ORDER — HYDROCODONE-ACETAMINOPHEN 10-325 MG PO TABS: 1.0000 | ORAL_TABLET | Freq: Every day | ORAL | 0 refills | Status: DC | PRN

## 2020-06-22 NOTE — Telephone Encounter (Signed)
Patient is requesting a refill of the following medications: Requested Prescriptions   Pending Prescriptions Disp Refills  . HYDROcodone-acetaminophen (NORCO) 10-325 MG tablet 90 tablet 0    Sig: Take 1 tablet by mouth daily as needed for severe pain.    Date of patient request: 06/22/20  Last office visit: 03/28/20  Date of last refill: 05/21/20  Last refill amount: 90, 0  Follow up time period per chart: 09/27/20

## 2020-07-10 ENCOUNTER — Telehealth: Payer: Self-pay

## 2020-07-10 DIAGNOSIS — R739 Hyperglycemia, unspecified: Secondary | ICD-10-CM

## 2020-07-10 DIAGNOSIS — I10 Essential (primary) hypertension: Secondary | ICD-10-CM

## 2020-07-10 NOTE — Telephone Encounter (Signed)
Ok labs are ordered 

## 2020-07-10 NOTE — Telephone Encounter (Signed)
none

## 2020-07-11 NOTE — Telephone Encounter (Signed)
Left patient a voicemail letting him know that labs has been ordered.

## 2020-07-12 ENCOUNTER — Other Ambulatory Visit (INDEPENDENT_AMBULATORY_CARE_PROVIDER_SITE_OTHER): Payer: Medicare Other

## 2020-07-12 DIAGNOSIS — I10 Essential (primary) hypertension: Secondary | ICD-10-CM

## 2020-07-12 DIAGNOSIS — R739 Hyperglycemia, unspecified: Secondary | ICD-10-CM | POA: Diagnosis not present

## 2020-07-12 LAB — BASIC METABOLIC PANEL
BUN: 13 mg/dL (ref 6–23)
CO2: 28 mEq/L (ref 19–32)
Calcium: 9.2 mg/dL (ref 8.4–10.5)
Chloride: 101 mEq/L (ref 96–112)
Creatinine, Ser: 0.87 mg/dL (ref 0.40–1.50)
GFR: 76.01 mL/min (ref >60.00)
Glucose, Bld: 118 mg/dL — ABNORMAL HIGH (ref 70–99)
Potassium: 4 mEq/L (ref 3.5–5.1)
Sodium: 135 mEq/L (ref 135–145)

## 2020-07-12 LAB — LIPID PANEL
Cholesterol: 101 mg/dL (ref 0–200)
HDL: 49.6 mg/dL (ref >39.00)
LDL Cholesterol: 42 mg/dL (ref 0–99)
NonHDL: 51.82
Total CHOL/HDL Ratio: 2
Triglycerides: 49 mg/dL (ref 0.0–149.0)
VLDL: 9.8 mg/dL (ref 0.0–40.0)

## 2020-07-12 LAB — CBC WITH DIFFERENTIAL/PLATELET
Basophils Absolute: 0 10*3/uL (ref 0.0–0.1)
Basophils Relative: 0.3 % (ref 0.0–3.0)
Eosinophils Absolute: 0.1 10*3/uL (ref 0.0–0.7)
Eosinophils Relative: 2.2 % (ref 0.0–5.0)
HCT: 40.6 % (ref 39.0–52.0)
Hemoglobin: 13.6 g/dL (ref 13.0–17.0)
Lymphocytes Relative: 48.2 % — ABNORMAL HIGH (ref 12.0–46.0)
Lymphs Abs: 3.2 10*3/uL (ref 0.7–4.0)
MCHC: 33.6 g/dL (ref 30.0–36.0)
MCV: 96.6 fl (ref 78.0–100.0)
Monocytes Absolute: 0.6 10*3/uL (ref 0.1–1.0)
Monocytes Relative: 9.6 % (ref 3.0–12.0)
Neutro Abs: 2.7 10*3/uL (ref 1.4–7.7)
Neutrophils Relative %: 39.7 % — ABNORMAL LOW (ref 43.0–77.0)
Platelets: 219 10*3/uL (ref 150.0–400.0)
RBC: 4.2 Mil/uL — ABNORMAL LOW (ref 4.22–5.81)
RDW: 13.4 % (ref 11.5–15.5)
WBC: 6.7 10*3/uL (ref 4.0–10.5)

## 2020-07-12 LAB — HEPATIC FUNCTION PANEL
ALT: 17 U/L (ref 0–53)
AST: 22 U/L (ref 0–37)
Albumin: 4.1 g/dL (ref 3.5–5.2)
Alkaline Phosphatase: 97 U/L (ref 39–117)
Bilirubin, Direct: 0.3 mg/dL (ref 0.0–0.3)
Total Bilirubin: 1.1 mg/dL (ref 0.2–1.2)
Total Protein: 7 g/dL (ref 6.0–8.3)

## 2020-07-12 LAB — HEMOGLOBIN A1C: Hgb A1c MFr Bld: 6 % (ref 4.6–6.5)

## 2020-07-13 ENCOUNTER — Encounter: Payer: Self-pay | Admitting: Internal Medicine

## 2020-07-20 ENCOUNTER — Telehealth: Payer: Self-pay | Admitting: Interventional Cardiology

## 2020-07-20 MED ORDER — ELIQUIS 5 MG PO TABS: 5.0000 mg | ORAL_TABLET | Freq: Two times a day (BID) | ORAL | 0 refills | Status: DC

## 2020-07-20 NOTE — Telephone Encounter (Signed)
    Pt's wife calling back, she is in the pharmacy trying to pick up pt's eliquis. She is now requesting 30 days supply, called Melissa and she said she will send new prescription for 30 days supply right now.[name]

## 2020-07-20 NOTE — Addendum Note (Signed)
Addended by: Marcelle Overlie D on: 07/20/2020 04:42 PM   Modules accepted: Orders

## 2020-07-20 NOTE — Telephone Encounter (Signed)
Patient calling the office for samples of medication:   1.  What medication and dosage are you requesting samples for? Eliquis  2.  Are you currently out of this medication? Need enough until his mail order comes in   If there are no samples, please call in a refill for enough until his order comes in.    *STAT* If patient is at the pharmacy, call can be transferred to refill team.   1. Which medications need to be refilled? (please list name of each medication and dose if known)  new prescription for Eliquis  2. Which pharmacy/location (including street and city if local pharmacy) is medication to be sent to? [facility] [place]  3. Do they need a 30 day or 90 day supply? 10 days supply which is 20

## 2020-07-24 ENCOUNTER — Other Ambulatory Visit: Payer: Self-pay | Admitting: Internal Medicine

## 2020-07-24 MED ORDER — HYDROCODONE-ACETAMINOPHEN 10-325 MG PO TABS: 1.0000 | ORAL_TABLET | Freq: Every day | ORAL | 0 refills | Status: DC | PRN

## 2020-08-03 ENCOUNTER — Other Ambulatory Visit: Payer: Self-pay

## 2020-08-03 ENCOUNTER — Ambulatory Visit (INDEPENDENT_AMBULATORY_CARE_PROVIDER_SITE_OTHER): Payer: Medicare Other | Admitting: Internal Medicine

## 2020-08-03 ENCOUNTER — Encounter: Payer: Self-pay | Admitting: Internal Medicine

## 2020-08-03 VITALS — BP 158/76 | HR 77 | Temp 98.7°F | Ht 70.0 in | Wt 172.0 lb

## 2020-08-03 DIAGNOSIS — K589 Irritable bowel syndrome without diarrhea: Secondary | ICD-10-CM | POA: Diagnosis not present

## 2020-08-03 DIAGNOSIS — I10 Essential (primary) hypertension: Secondary | ICD-10-CM

## 2020-08-03 DIAGNOSIS — R739 Hyperglycemia, unspecified: Secondary | ICD-10-CM | POA: Diagnosis not present

## 2020-08-03 DIAGNOSIS — H6123 Impacted cerumen, bilateral: Secondary | ICD-10-CM

## 2020-08-03 DIAGNOSIS — E782 Mixed hyperlipidemia: Secondary | ICD-10-CM

## 2020-08-03 NOTE — Patient Instructions (Signed)
Your ears were cleared of wax today  Please continue all other medications as before, and refills have been done if requested.  Please have the pharmacy call with any other refills you may need.  Please continue your efforts at being more active, low cholesterol diet, and weight control.  Please keep your appointments with your specialists as you may have planned  Please make an Appointment to return in 6 months, or sooner if needed

## 2020-08-03 NOTE — Progress Notes (Signed)
Patient ID: Jonathan Orozco, male   DOB: [birth year], 87 y.o.   MRN: [id number]        Chief Complaint: follow up HTN, chronic constipation, bilateral hearing reduced, hyperglycemia, hld       HPI:  Jonathan Orozco is a 87 y.o. male here with above, has chronic constipatoin, last seen per GI feb 2022 though he does not recall this or the advice; has ongoing issue despite laxative, prune and fiber 1 and metamucil; he worries his PPI may be causing dizziness so s thinking about stopping it, BP has been better controlled at home, and declines any med change today.  Also has bilateral hearing loss worse in the past wk, without HA, fever, pain, ear d/c, tinnitus or vertigo.         Wt Readings from Last 3 Encounters:  08/03/20 172 lb (78 kg)  03/28/20 174 lb (78.9 kg)  01/11/20 165 lb (74.8 kg)   BP Readings from Last 3 Encounters:  08/03/20 (!) 158/76  03/28/20 (!) 140/92  01/11/20 (!) 140/100         Past Medical History:  Diagnosis Date   ABUSE, ALCOHOL, IN REMISSION 04/08/2007   ALLERGIC RHINITIS 09/29/2006   Arthritis    "hands" (07/02/2016)   Atrial fibrillation (HCC)    Atrial fibrillation with RVR ([place]) 07/02/2016   Atrial flutter ([place]) 03/28/2010   a. s/p prior rfca;  b. recurrent paroxysmal flutter 04/2012;  c. pradaxa initiated 04/2012.   BENIGN PROSTATIC HYPERTROPHY 09/29/2006   BPPV (benign paroxysmal positional vertigo) 04/08/2007   CAD (coronary artery disease)    a. LHC 2/17: EF 55-65%, LM 75, pLAD 75, oD1 75, oD2 65, D3 85, oLCx 99, oOM1 75, pRCA 25 >> CABG   Carotid stenosis    a. 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   Diverticulosis    ERECTILE DYSFUNCTION 09/29/2006   GERD (gastroesophageal reflux disease)    GLUCOSE INTOLERANCE 03/19/2010   HYPERLIPIDEMIA 09/29/2006   HYPERTENSION 09/29/2006   Long term (current) use of anticoagulants 04/26/2010   MELANOMA, MALIGNANT,  SKIN NOS 09/29/2006   other skin cancers -no further melanoma   OSTEOARTHROSIS NOS, OTHER [facility] SITE 09/29/2006   Other specified forms of hearing loss 08/10/2009   Pneumonia ~ 1969   Presence of permanent cardiac pacemaker 06/02/2016   VENTRICULAR TACHYCARDIA 09/29/2006   Past Surgical History:  Procedure Laterality Date   AV NODE ABLATION  07/02/2016   AV NODE ABLATION N/A 07/02/2016   Procedure: AV Node Ablation;  Surgeon: Deboraha Sprang, MD;  Location: [place] CV LAB;  Service: Cardiovascular;  Laterality: N/A;   CARDIAC CATHETERIZATION N/A 03/12/2015   Procedure: Left Heart Cath and Coronary Angiography;  Surgeon: [name], MD;  Location: [place] CV LAB;  Service: Cardiovascular;  Laterality: N/A;   CARDIOVERSION N/A 12/19/2015   Procedure: CARDIOVERSION;  Surgeon: [name], MD;  Location: [facility] ENDOSCOPY;  Service: Cardiovascular;  Laterality: N/A;   CARDIOVERSION N/A 03/24/2016   Procedure: CARDIOVERSION;  Surgeon: [name], MD;  Location: [place];  Service: Cardiovascular;  Laterality: N/A;   CATARACT EXTRACTION W/ INTRAOCULAR LENS  IMPLANT, BILATERAL Bilateral    CLIPPING OF ATRIAL APPENDAGE N/A 03/14/2015   Procedure: CLIPPING OF ATRIAL APPENDAGE;  Surgeon: [name], MD;  Location: [place];  Service: Open Heart Surgery;  Laterality: N/A;  COLONOSCOPY WITH PROPOFOL N/A 12/08/2014   Procedure: COLONOSCOPY WITH PROPOFOL;  Surgeon: [name], MD;  Location: [facility] ENDOSCOPY;  Service: Endoscopy;  Laterality: N/A;   CORONARY ARTERY BYPASS GRAFT N/A 03/14/2015   Procedure: CORONARY ARTERY BYPASS GRAFTING (CABG) x 4 (LIMA to LAD, SVG to DIAGONAL 2, SVG SEQUENTIALLY to OM1 and OM2);  Surgeon: [name], MD;  Location: [place];  Service: Open Heart Surgery;  Laterality: N/A;   INCISION AND DRAINAGE N/A 07/03/2016   Procedure: INCISION AND DRAINAGE of CHEST ABSCESS;  Surgeon: [name], MD;  Location: [place];  Service: Thoracic;  Laterality: N/A;    INSERT / REPLACE / REMOVE PACEMAKER  06/02/2016   JOINT REPLACEMENT     KNEE ARTHROSCOPY Left    MELANOMA EXCISION     "upper back"   PACEMAKER IMPLANT N/A 06/02/2016   Procedure: Pacemaker Implant;  Surgeon: Deboraha Sprang, MD;  Location: [place] CV LAB;  Service: Cardiovascular;  Laterality: N/A;   SHOULDER OPEN ROTATOR CUFF REPAIR Left    SKIN CANCER EXCISION Right    "cheek; real deep"   TEE WITHOUT CARDIOVERSION N/A 03/14/2015   Procedure: TRANSESOPHAGEAL ECHOCARDIOGRAM (TEE);  Surgeon: [name], MD;  Location: [place];  Service: Open Heart Surgery;  Laterality: N/A;   TEE WITHOUT CARDIOVERSION N/A 12/19/2015   Procedure: TRANSESOPHAGEAL ECHOCARDIOGRAM (TEE);  Surgeon: [name], MD;  Location: [place];  Service: Cardiovascular;  Laterality: N/A;   TEE WITHOUT CARDIOVERSION N/A 03/24/2016   Procedure: TRANSESOPHAGEAL ECHOCARDIOGRAM (TEE);  Surgeon: [name], MD;  Location: [place];  Service: Cardiovascular;  Laterality: N/A;   TONSILLECTOMY     TOTAL KNEE ARTHROPLASTY Right     reports that he has quit smoking. His smoking use included cigarettes. He has a 52.00 pack-year smoking history. He has never used smokeless tobacco. He reports that he does not drink alcohol and does not use drugs. family history includes Diabetes in his brother; Esophageal cancer in his father. Allergies  Allergen Reactions   Amiodarone Hcl Other (See Comments)    Patient reports photosensitivity   Ace Inhibitors Cough   Current Outpatient Medications on File Prior to Visit  Medication Sig Dispense Refill   atorvastatin (LIPITOR) 10 MG tablet Take 1 tablet (10 mg total) by mouth daily. 90 tablet 3   b complex vitamins tablet Take 1 tablet by mouth daily.     COVID-19 mRNA vaccine, Pfizer, 30 MCG/0.3ML injection INJECT AS DIRECTED .3 mL 0   doxylamine, Sleep, (UNISOM) 25 MG tablet Take 25 mg by mouth at bedtime as needed for sleep.      ELIQUIS 5 MG TABS tablet Take 1  tablet (5 mg total) by mouth 2 (two) times daily. 60 tablet 0   HYDROcodone-acetaminophen (NORCO) 10-325 MG tablet Take 1 tablet by mouth daily as needed for severe pain. 90 tablet 0   losartan (COZAAR) 50 MG tablet Take 1 tablet (50 mg total) by mouth daily. 90 tablet 3   Multiple Vitamin (MULTIVITAMIN) tablet Take 1 tablet by mouth daily.     omega-3 acid ethyl esters (LOVAZA) 1 g capsule Take by mouth.     pantoprazole (PROTONIX) 40 MG tablet Take 1 tablet (40 mg total) by mouth daily. 90 tablet 3   Psyllium (METAMUCIL PO) Take 15 mLs by mouth 3 (three) times daily as needed (constipation).      tamsulosin (FLOMAX) 0.4 MG CAPS capsule Take 1 capsule (0.4 mg total) by mouth daily. 90 capsule  3   No current facility-administered medications on file prior to visit.        ROS:  All others reviewed and negative.  Objective        PE:  BP (!) 158/76 (BP Location: Left Arm, Patient Position: Sitting, Cuff Size: Normal)   Pulse 77   Temp 98.7 F (37.1 C) (Oral)   Ht 5\' 10"  (1.778 m)   Wt 172 lb (78 kg)   SpO2 98%   BMI 24.68 kg/m                 Constitutional: Pt appears in NAD               HENT: Head: NCAT.                Right Ear: External ear normal.                 Left Ear: External ear normal. , bilat wax impactions cleared with irrigation               Eyes: . Pupils are equal, round, and reactive to light. Conjunctivae and EOM are normal               Nose: without d/c or deformity               Neck: Neck supple. Gross normal ROM               Cardiovascular: Normal rate and regular rhythm.                 Pulmonary/Chest: Effort normal and breath sounds without rales or wheezing.                Abd:  Soft, NT, ND, + BS, no organomegaly               Neurological: Pt is alert. At baseline orientation, motor grossly intact               Skin: Skin is warm. No rashes, no other new lesions, LE edema - none               Psychiatric: Pt behavior is normal without agitation    Micro: none  Cardiac tracings I have personally interpreted today:  none  Pertinent Radiological findings (summarize): none   Lab Results  Component Value Date   WBC 6.7 07/12/2020   HGB 13.6 07/12/2020   HCT 40.6 07/12/2020   PLT 219.0 07/12/2020   GLUCOSE 118 (H) 07/12/2020   CHOL 101 07/12/2020   TRIG 49.0 07/12/2020   HDL 49.60 07/12/2020   LDLDIRECT 159.2 09/29/2006   LDLCALC 42 07/12/2020   ALT 17 07/12/2020   AST 22 07/12/2020   NA 135 07/12/2020   K 4.0 07/12/2020   CL 101 07/12/2020   CREATININE 0.87 07/12/2020   BUN 13 07/12/2020   CO2 28 07/12/2020   TSH 0.79 01/11/2020   PSA 3.75 06/12/2016   INR 3.1 (A) 06/01/2018   HGBA1C 6.0 07/12/2020   Assessment/Plan:  ABB GOBERT is a 87 y.o. White or Caucasian [1] male with  has a past medical history of ABUSE, ALCOHOL, IN REMISSION (04/08/2007), ALLERGIC RHINITIS (09/29/2006), Arthritis, Atrial fibrillation ([place]), Atrial fibrillation with RVR ([place]) (07/02/2016), Atrial flutter ([place]) (03/28/2010), BENIGN PROSTATIC HYPERTROPHY (09/29/2006), BPPV (benign paroxysmal positional vertigo) (04/08/2007), CAD (coronary artery disease), Carotid stenosis, Chronic lower back pain, COLONIC POLYPS, HX OF (06/23/2007), DISORDERS, ORGANIC INSOMNIA NOS (09/29/2006), Diverticulosis,  ERECTILE DYSFUNCTION (09/29/2006), GERD (gastroesophageal reflux disease), GLUCOSE INTOLERANCE (03/19/2010), HYPERLIPIDEMIA (09/29/2006), HYPERTENSION (09/29/2006), Long term (current) use of anticoagulants (04/26/2010), MELANOMA, MALIGNANT, SKIN NOS (09/29/2006), OSTEOARTHROSIS NOS, OTHER SPEC SITE (09/29/2006), Other specified forms of hearing loss (08/10/2009), Pneumonia (~ 1969), Presence of permanent cardiac pacemaker (06/02/2016), and VENTRICULAR TACHYCARDIA (09/29/2006).  Bilateral impacted cerumen Patient agreed to ear lavage due to bilateral cerumen impaction; Ear lavage completed with no difficulty, impaction resolved, TM's look normal; she will continue using OTC kits  regularly for prevention   Hyperglycemia Lab Results  Component Value Date   HGBA1C 6.0 07/12/2020   Stable, pt to continue current medical treatment  - diet   Hyperlipidemia Lab Results  Component Value Date   LDLCALC 42 07/12/2020   Stable, pt to continue current statin lipitor 10   Hypertension, uncontrolled BP Readings from Last 3 Encounters:  08/03/20 (!) 158/76  03/28/20 (!) 140/92  01/11/20 (!) 140/100   uncontrolled, pt to continue medical treatment losartan 50 as declines change, to check bp at home daily for 10 days and call with average  IBS (irritable bowel syndrome) With chronic constipation, per Dr Benson Norway last visit pt is to avoid laxatives as he has likely pelvic floor dysfxn  Followup: Return in about 6 months (around 02/03/2021).  [name], MD 08/04/2020 4:55 PM [facility]

## 2020-08-04 ENCOUNTER — Encounter: Payer: Self-pay | Admitting: Internal Medicine

## 2020-08-04 NOTE — Assessment & Plan Note (Signed)
Lab Results  Component Value Date   LDLCALC 42 07/12/2020   Stable, pt to continue current statin lipitor 10

## 2020-08-04 NOTE — Assessment & Plan Note (Signed)
Lab Results  Component Value Date   HGBA1C 6.0 07/12/2020   Stable, pt to continue current medical treatment  - diet

## 2020-08-04 NOTE — Assessment & Plan Note (Signed)
BP Readings from Last 3 Encounters:  08/03/20 (!) 158/76  03/28/20 (!) 140/92  01/11/20 (!) 140/100   uncontrolled, pt to continue medical treatment losartan 50 as declines change, to check bp at home daily for 10 days and call with average

## 2020-08-04 NOTE — Assessment & Plan Note (Signed)
With chronic constipation, per Dr Benson Norway last visit pt is to avoid laxatives as he has likely pelvic floor dysfxn

## 2020-08-04 NOTE — Assessment & Plan Note (Signed)
Patient agreed to ear lavage due to bilateral cerumen impaction; Ear lavage completed with no difficulty, impaction resolved, TM's look normal; she will continue using OTC kits regularly for prevention

## 2020-08-16 ENCOUNTER — Telehealth: Payer: Self-pay | Admitting: Internal Medicine

## 2020-08-16 NOTE — Telephone Encounter (Signed)
    Requesting order for bone density and also an order for cologuard kit

## 2020-08-16 NOTE — Telephone Encounter (Signed)
Sorry, the cologuard is not recommended at his age  Also there is no routine recommendation for bone density to be done for men as well, thanks

## 2020-08-17 NOTE — Telephone Encounter (Signed)
Left patient a voicmeail of Dr. Jenny Reichmann last note.

## 2020-08-20 ENCOUNTER — Ambulatory Visit (INDEPENDENT_AMBULATORY_CARE_PROVIDER_SITE_OTHER): Payer: Medicare Other

## 2020-08-20 DIAGNOSIS — I480 Paroxysmal atrial fibrillation: Secondary | ICD-10-CM | POA: Diagnosis not present

## 2020-08-21 ENCOUNTER — Ambulatory Visit: Payer: Medicare Other | Attending: Internal Medicine

## 2020-08-21 DIAGNOSIS — Z23 Encounter for immunization: Secondary | ICD-10-CM

## 2020-08-21 LAB — CUP PACEART REMOTE DEVICE CHECK
Battery Remaining Longevity: 97 mo
Battery Voltage: 2.99 V
Brady Statistic RV Percent Paced: 99.94 %
Date Time Interrogation Session: 20220719115940
Implantable Lead Implant Date: 20180430
Implantable Lead Location: 753860
Implantable Lead Model: 5076
Implantable Pulse Generator Implant Date: 20180430
Lead Channel Impedance Value: 361 Ohm
Lead Channel Impedance Value: 532 Ohm
Lead Channel Pacing Threshold Amplitude: 0.75 V
Lead Channel Pacing Threshold Pulse Width: 0.4 ms
Lead Channel Sensing Intrinsic Amplitude: 5.125 mV
Lead Channel Sensing Intrinsic Amplitude: 5.125 mV
Lead Channel Setting Pacing Amplitude: 2.5 V
Lead Channel Setting Pacing Pulse Width: 0.4 ms
Lead Channel Setting Sensing Sensitivity: 1.2 mV

## 2020-08-21 NOTE — Progress Notes (Signed)
   Covid-19 Vaccination Clinic  Name:  HRISHIKESH HOEG    MRN: [id number] DOB: 10-02-1928  08/21/2020  Mr. Almanzar was observed post Covid-19 immunization for 15 minutes without incident. He was provided with Vaccine Information Sheet and instruction to access the V-Safe system.   Mr. Soltys was instructed to call 911 with any severe reactions post vaccine: Difficulty breathing  Swelling of face and throat  A fast heartbeat  A bad rash all over body  Dizziness and weakness   Immunizations Administered     Name Date Dose VIS Date Route   PFIZER Comrnaty(Gray TOP) Covid-19 Vaccine 08/21/2020  1:02 PM 0.3 mL 01/12/2020 Intramuscular   Manufacturer: [place]   Lot: [id number]   [place]: [phone]

## 2020-08-27 ENCOUNTER — Other Ambulatory Visit (HOSPITAL_BASED_OUTPATIENT_CLINIC_OR_DEPARTMENT_OTHER): Payer: Self-pay

## 2020-08-27 MED ORDER — COVID-19 MRNA VAC-TRIS(PFIZER) 30 MCG/0.3ML IM SUSP
INTRAMUSCULAR | 0 refills | Status: DC
Start: 2020-08-24 — End: 2020-10-19
  Filled 2020-08-27: qty 0.3, 1d supply, fill #0

## 2020-09-11 NOTE — Progress Notes (Signed)
Remote pacemaker transmission.   

## 2020-09-17 ENCOUNTER — Ambulatory Visit: Payer: Medicare Other | Admitting: Internal Medicine

## 2020-09-24 ENCOUNTER — Other Ambulatory Visit: Payer: Self-pay | Admitting: Internal Medicine

## 2020-09-24 MED ORDER — HYDROCODONE-ACETAMINOPHEN 10-325 MG PO TABS: 1.0000 | ORAL_TABLET | Freq: Every day | ORAL | 0 refills | Status: DC | PRN

## 2020-09-27 ENCOUNTER — Ambulatory Visit: Payer: Medicare Other | Admitting: Internal Medicine

## 2020-10-09 ENCOUNTER — Other Ambulatory Visit: Payer: Self-pay | Admitting: Internal Medicine

## 2020-10-09 NOTE — Telephone Encounter (Signed)
Please refill as per office routine med refill policy (all routine meds to be refilled for 3 mo or monthly (per pt preference) up to one year from last visit, then month to month grace period for 3 mo, then further med refills will have to be denied) ? ?

## 2020-10-19 ENCOUNTER — Encounter: Payer: Self-pay | Admitting: Internal Medicine

## 2020-10-19 ENCOUNTER — Other Ambulatory Visit: Payer: Self-pay

## 2020-10-19 ENCOUNTER — Ambulatory Visit (INDEPENDENT_AMBULATORY_CARE_PROVIDER_SITE_OTHER): Payer: Medicare Other | Admitting: Internal Medicine

## 2020-10-19 VITALS — BP 132/76 | HR 69 | Temp 98.1°F | Ht 70.0 in | Wt 172.0 lb

## 2020-10-19 DIAGNOSIS — E782 Mixed hyperlipidemia: Secondary | ICD-10-CM

## 2020-10-19 DIAGNOSIS — Z0189 Encounter for other specified special examinations: Secondary | ICD-10-CM

## 2020-10-19 DIAGNOSIS — I10 Essential (primary) hypertension: Secondary | ICD-10-CM | POA: Diagnosis not present

## 2020-10-19 DIAGNOSIS — R739 Hyperglycemia, unspecified: Secondary | ICD-10-CM

## 2020-10-19 DIAGNOSIS — Z23 Encounter for immunization: Secondary | ICD-10-CM

## 2020-10-19 NOTE — Patient Instructions (Signed)
Your letter was given today  Please continue all other medications as before, and refills have been done if requested.  Please have the pharmacy call with any other refills you may need.  Please continue your efforts at being more active, low cholesterol diet, and weight control.  Please keep your appointments with your specialists as you may have planned

## 2020-10-19 NOTE — Progress Notes (Signed)
Patient ID: Jonathan Orozco, male   DOB: May 16, 1928, 87 y.o.   MRN: [id number]        Chief Complaint: follow up HTN, HLD and hyperglycemia, and question of competancy       HPI:  Jonathan Orozco is a 87 y.o. male here with wife, oveall doing well;  Pt denies chest pain, increased sob or doe, wheezing, orthopnea, PND, increased LE swelling, palpitations, dizziness or syncope.   Pt denies polydipsia, polyuria, or new focal neuro s/s.  Remains alert and oriented x 3 with intact ST memory and judgement.   Pt denies fever, wt loss, night sweats, loss of appetite, or other constitutional symptoms        Wt Readings from Last 3 Encounters:  10/19/20 172 lb (78 kg)  08/03/20 172 lb (78 kg)  03/28/20 174 lb (78.9 kg)   BP Readings from Last 3 Encounters:  10/19/20 132/76  08/03/20 (!) 158/76  03/28/20 (!) 140/92         Past Medical History:  Diagnosis Date   ABUSE, ALCOHOL, IN REMISSION 04/08/2007   ALLERGIC RHINITIS 09/29/2006   Arthritis    "hands" (07/02/2016)   Atrial fibrillation (HCC)    Atrial fibrillation with RVR ([place]) 07/02/2016   Atrial flutter ([place]) 03/28/2010   a. s/p prior rfca;  b. recurrent paroxysmal flutter 04/2012;  c. pradaxa initiated 04/2012.   BENIGN PROSTATIC HYPERTROPHY 09/29/2006   BPPV (benign paroxysmal positional vertigo) 04/08/2007   CAD (coronary artery disease)    a. LHC 2/17: EF 55-65%, LM 75, pLAD 75, oD1 75, oD2 65, D3 85, oLCx 99, oOM1 75, pRCA 25 >> CABG   Carotid stenosis    a. 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   Diverticulosis    ERECTILE DYSFUNCTION 09/29/2006   GERD (gastroesophageal reflux disease)    GLUCOSE INTOLERANCE 03/19/2010   HYPERLIPIDEMIA 09/29/2006   HYPERTENSION 09/29/2006   Long term (current) use of anticoagulants 04/26/2010   MELANOMA, MALIGNANT, SKIN NOS 09/29/2006   other skin cancers -no further melanoma   OSTEOARTHROSIS NOS,  OTHER [facility] SITE 09/29/2006   Other specified forms of hearing loss 08/10/2009   Pneumonia ~ 1969   Presence of permanent cardiac pacemaker 06/02/2016   VENTRICULAR TACHYCARDIA 09/29/2006   Past Surgical History:  Procedure Laterality Date   AV NODE ABLATION  07/02/2016   AV NODE ABLATION N/A 07/02/2016   Procedure: AV Node Ablation;  Surgeon: Deboraha Sprang, MD;  Location: [place] CV LAB;  Service: Cardiovascular;  Laterality: N/A;   CARDIAC CATHETERIZATION N/A 03/12/2015   Procedure: Left Heart Cath and Coronary Angiography;  Surgeon: [name], MD;  Location: [place] [facility] LAB;  Service: Cardiovascular;  Laterality: N/A;   CARDIOVERSION N/A 12/19/2015   Procedure: CARDIOVERSION;  Surgeon: [name], MD;  Location: [facility] ENDOSCOPY;  Service: Cardiovascular;  Laterality: N/A;   CARDIOVERSION N/A 03/24/2016   Procedure: CARDIOVERSION;  Surgeon: [name], MD;  Location: [place];  Service: Cardiovascular;  Laterality: N/A;   CATARACT EXTRACTION W/ INTRAOCULAR LENS  IMPLANT, BILATERAL Bilateral    CLIPPING OF ATRIAL APPENDAGE N/A 03/14/2015   Procedure: CLIPPING OF ATRIAL APPENDAGE;  Surgeon: [name], MD;  Location: [place];  Service: Open Heart Surgery;  Laterality: N/A;   COLONOSCOPY WITH PROPOFOL N/A 12/08/2014   Procedure: COLONOSCOPY WITH PROPOFOL;  Surgeon: Saralyn Pilar  Benson Norway, MD;  Location: [facility];  Service: Endoscopy;  Laterality: N/A;   CORONARY ARTERY BYPASS GRAFT N/A 03/14/2015   Procedure: CORONARY ARTERY BYPASS GRAFTING (CABG) x 4 (LIMA to LAD, SVG to DIAGONAL 2, SVG SEQUENTIALLY to OM1 and OM2);  Surgeon: [name], MD;  Location: [place];  Service: Open Heart Surgery;  Laterality: N/A;   INCISION AND DRAINAGE N/A 07/03/2016   Procedure: INCISION AND DRAINAGE of CHEST ABSCESS;  Surgeon: [name], MD;  Location: [place];  Service: Thoracic;  Laterality: N/A;   INSERT / REPLACE / REMOVE PACEMAKER  06/02/2016   JOINT REPLACEMENT     KNEE  ARTHROSCOPY Left    MELANOMA EXCISION     "upper back"   PACEMAKER IMPLANT N/A 06/02/2016   Procedure: Pacemaker Implant;  Surgeon: Deboraha Sprang, MD;  Location: [place] CV LAB;  Service: Cardiovascular;  Laterality: N/A;   SHOULDER OPEN ROTATOR CUFF REPAIR Left    SKIN CANCER EXCISION Right    "cheek; real deep"   TEE WITHOUT CARDIOVERSION N/A 03/14/2015   Procedure: TRANSESOPHAGEAL ECHOCARDIOGRAM (TEE);  Surgeon: [name], MD;  Location: [place];  Service: Open Heart Surgery;  Laterality: N/A;   TEE WITHOUT CARDIOVERSION N/A 12/19/2015   Procedure: TRANSESOPHAGEAL ECHOCARDIOGRAM (TEE);  Surgeon: [name], MD;  Location: [place];  Service: Cardiovascular;  Laterality: N/A;   TEE WITHOUT CARDIOVERSION N/A 03/24/2016   Procedure: TRANSESOPHAGEAL ECHOCARDIOGRAM (TEE);  Surgeon: [name], MD;  Location: [place];  Service: Cardiovascular;  Laterality: N/A;   TONSILLECTOMY     TOTAL KNEE ARTHROPLASTY Right     reports that he has quit smoking. His smoking use included cigarettes. He has a 52.00 pack-year smoking history. He has never used smokeless tobacco. He reports that he does not drink alcohol and does not use drugs. family history includes Diabetes in his brother; Esophageal cancer in his father. Allergies  Allergen Reactions   Amiodarone Hcl Other (See Comments)    Patient reports photosensitivity   Ace Inhibitors Cough   Current Outpatient Medications on File Prior to Visit  Medication Sig Dispense Refill   atorvastatin (LIPITOR) 10 MG tablet TAKE 1 TABLET DAILY 90 tablet 3   b complex vitamins tablet Take 1 tablet by mouth daily.     losartan (COZAAR) 50 MG tablet Take 1 tablet (50 mg total) by mouth daily. 90 tablet 3   Multiple Vitamin (MULTIVITAMIN) tablet Take 1 tablet by mouth daily.     omega-3 acid ethyl esters (LOVAZA) 1 g capsule Take by mouth.     pantoprazole (PROTONIX) 40 MG tablet Take 1 tablet (40 mg total) by mouth daily. 90 tablet  3   Psyllium (METAMUCIL PO) Take 15 mLs by mouth 3 (three) times daily as needed (constipation).      tamsulosin (FLOMAX) 0.4 MG CAPS capsule TAKE 1 CAPSULE DAILY 90 capsule 3   No current facility-administered medications on file prior to visit.        ROS:  All others reviewed and negative.  Objective        PE:  BP 132/76 (BP Location: Left Arm, Patient Position: Sitting, Cuff Size: Normal)   Pulse 69   Temp 98.1 F (36.7 C) (Oral)   Ht '5\' 10"'$  (1.778 m)   Wt 172 lb (78 kg)   SpO2 100%   BMI 24.68 kg/m                 Constitutional: Pt appears in  NAD               HENT: Head: NCAT.                Right Ear: External ear normal.                 Left Ear: External ear normal.                Eyes: . Pupils are equal, round, and reactive to light. Conjunctivae and EOM are normal               Nose: without d/c or deformity               Neck: Neck supple. Gross normal ROM               Cardiovascular: Normal rate and regular rhythm.                 Pulmonary/Chest: Effort normal and breath sounds without rales or wheezing.                Abd:  Soft, NT, ND, + BS, no organomegaly               Neurological: Pt is alert. At baseline orientation, motor grossly intact               Skin: Skin is warm. No rashes, no other new lesions, LE edema - none               Psychiatric: Pt behavior is normal without agitation   Micro: none  Cardiac tracings I have personally interpreted today:  none  Pertinent Radiological findings (summarize): none   Lab Results  Component Value Date   WBC 6.7 07/12/2020   HGB 13.6 07/12/2020   HCT 40.6 07/12/2020   PLT 219.0 07/12/2020   GLUCOSE 118 (H) 07/12/2020   CHOL 101 07/12/2020   TRIG 49.0 07/12/2020   HDL 49.60 07/12/2020   LDLDIRECT 159.2 09/29/2006   LDLCALC 42 07/12/2020   ALT 17 07/12/2020   AST 22 07/12/2020   NA 135 07/12/2020   K 4.0 07/12/2020   CL 101 07/12/2020   CREATININE 0.87 07/12/2020   BUN 13 07/12/2020   CO2 28  07/12/2020   TSH 0.79 01/11/2020   PSA 3.75 06/12/2016   INR 3.1 (A) 06/01/2018   HGBA1C 6.0 07/12/2020   Assessment/Plan:  Jonathan Orozco is a 87 y.o. White or Caucasian [1] male with  has a past medical history of ABUSE, ALCOHOL, IN REMISSION (04/08/2007), ALLERGIC RHINITIS (09/29/2006), Arthritis, Atrial fibrillation ([place]), Atrial fibrillation with RVR ([place]) (07/02/2016), Atrial flutter ([place]) (03/28/2010), BENIGN PROSTATIC HYPERTROPHY (09/29/2006), BPPV (benign paroxysmal positional vertigo) (04/08/2007), CAD (coronary artery disease), Carotid stenosis, Chronic lower back pain, COLONIC POLYPS, HX OF (06/23/2007), DISORDERS, ORGANIC INSOMNIA NOS (09/29/2006), Diverticulosis, ERECTILE DYSFUNCTION (09/29/2006), GERD (gastroesophageal reflux disease), GLUCOSE INTOLERANCE (03/19/2010), HYPERLIPIDEMIA (09/29/2006), HYPERTENSION (09/29/2006), Long term (current) use of anticoagulants (04/26/2010), MELANOMA, MALIGNANT, SKIN NOS (09/29/2006), OSTEOARTHROSIS NOS, OTHER SPEC SITE (09/29/2006), Other specified forms of hearing loss (08/10/2009), Pneumonia (~ 1969), Presence of permanent cardiac pacemaker (06/02/2016), and VENTRICULAR TACHYCARDIA (09/29/2006).  Encounter for competency evaluation Letter given, pt found competant for personal health and financial concern management  Hypertension, uncontrolled BP Readings from Last 3 Encounters:  10/19/20 132/76  08/03/20 (!) 158/76  03/28/20 (!) 140/92   Stable, pt to continue medical treatment losartan 50   Hyperlipidemia Lab Results  Component Value Date   [facility]  42 07/12/2020   Stable, pt to continue current statin lipitor 10    Hyperglycemia Lab Results  Component Value Date   HGBA1C 6.0 07/12/2020   Stable, pt to continue current medical treatment  - diet  Followup: Return in about 3 months (around 01/18/2021).  Jonathan Cower, MD 10/27/2020 2:56 PM [facility]

## 2020-10-25 ENCOUNTER — Other Ambulatory Visit: Payer: Self-pay | Admitting: Internal Medicine

## 2020-10-25 MED ORDER — HYDROCODONE-ACETAMINOPHEN 10-325 MG PO TABS: 1.0000 | ORAL_TABLET | Freq: Every day | ORAL | 0 refills | Status: DC | PRN

## 2020-10-26 ENCOUNTER — Other Ambulatory Visit: Payer: Self-pay | Admitting: Interventional Cardiology

## 2020-10-26 NOTE — Telephone Encounter (Signed)
Pt last saw Dr Irish Lack 07/01/19, pt is overdue for follow-up.  Pt has an appt scheduled to see Dr Irish Lack 11/07/20.  Last labs 07/12/20 Creat 0.87, age 87, weight 78kg, based on specified criteria pt is on appropriate dosage of Eliquis 5mg  BID.  Will refill rx to get pt to upcoming appt with Dr Irish Lack.

## 2020-10-27 ENCOUNTER — Encounter: Payer: Self-pay | Admitting: Internal Medicine

## 2020-10-27 DIAGNOSIS — Z0189 Encounter for other specified special examinations: Secondary | ICD-10-CM | POA: Insufficient documentation

## 2020-10-27 NOTE — Assessment & Plan Note (Signed)
Lab Results  Component Value Date   HGBA1C 6.0 07/12/2020   Stable, pt to continue current medical treatment  - diet

## 2020-10-27 NOTE — Assessment & Plan Note (Signed)
BP Readings from Last 3 Encounters:  10/19/20 132/76  08/03/20 (!) 158/76  03/28/20 (!) 140/92   Stable, pt to continue medical treatment losartan 50

## 2020-10-27 NOTE — Assessment & Plan Note (Signed)
Lab Results  Component Value Date   LDLCALC 42 07/12/2020   Stable, pt to continue current statin lipitor 10

## 2020-10-27 NOTE — Assessment & Plan Note (Signed)
Letter given, pt found competant for personal health and financial concern management

## 2020-11-01 ENCOUNTER — Ambulatory Visit: Payer: Medicare Other

## 2020-11-07 ENCOUNTER — Other Ambulatory Visit: Payer: Self-pay

## 2020-11-07 ENCOUNTER — Ambulatory Visit (INDEPENDENT_AMBULATORY_CARE_PROVIDER_SITE_OTHER): Payer: Medicare Other | Admitting: Interventional Cardiology

## 2020-11-07 ENCOUNTER — Encounter: Payer: Self-pay | Admitting: Interventional Cardiology

## 2020-11-07 VITALS — BP 150/80 | HR 84 | Ht 70.0 in | Wt 169.6 lb

## 2020-11-07 DIAGNOSIS — I2510 Atherosclerotic heart disease of native coronary artery without angina pectoris: Secondary | ICD-10-CM

## 2020-11-07 DIAGNOSIS — I10 Essential (primary) hypertension: Secondary | ICD-10-CM | POA: Diagnosis not present

## 2020-11-07 DIAGNOSIS — Z950 Presence of cardiac pacemaker: Secondary | ICD-10-CM

## 2020-11-07 DIAGNOSIS — I480 Paroxysmal atrial fibrillation: Secondary | ICD-10-CM | POA: Diagnosis not present

## 2020-11-07 DIAGNOSIS — I5032 Chronic diastolic (congestive) heart failure: Secondary | ICD-10-CM | POA: Diagnosis not present

## 2020-11-07 NOTE — Progress Notes (Signed)
Cardiology Office Note   Date:  11/07/2020   ID:  Jonathan Orozco, DOB 1928/06/13, MRN [id number]  PCP:  Biagio Borg, MD    No chief complaint on file.  CAD  Wt Readings from Last 3 Encounters:  11/07/20 169 lb 9.6 oz (76.9 kg)  10/19/20 172 lb (78 kg)  08/03/20 172 lb (78 kg)       History of Present Illness: Jonathan Orozco is a 87 y.o. male   with history of CAD (03/14/15 CABG  LIMA > LAD, SVG > 2nd Diag and OM 1/2 , also had LAA clipping ), HTN, HFpEF, symptomatic bradycardia w/PPM, uncontrolled rapid AFib, Atypical AFlutter now s/p AVN ablation, RVOT VT intolerant of amiodarone (patient states with sun sensitivity and visual changes), chronic LBP. S/p left atrial appendage clipping.   He saw neurology, Dr. Tomi Likens 12/08/16 2/2 gait instability, exam not felt to suggest intracranial  issue, and referred to PT.     He is sent here for further eval due to severe nosebleeds.  He went to the ER and was treated with packing and sliver nitrates.  Eliquis was held briefly, but he had more nosebleeds when he restarted.  He went back to the ER.  Since 06/16/19, he has stayed of of Eliquis.  He saw ENT and had something cauterized in his nose on 5/21 with silver nitrate sticks.   Denies : Chest pain. Dizziness. Leg edema. Nitroglycerin use. Orthopnea. Palpitations. Paroxysmal nocturnal dyspnea. Shortness of breath. Syncope.    Notes that his balance is decreased.  No recent falls.  No recent bleeding issues.  Past Medical History:  Diagnosis Date   ABUSE, ALCOHOL, IN REMISSION 04/08/2007   ALLERGIC RHINITIS 09/29/2006   Arthritis    "hands" (07/02/2016)   Atrial fibrillation (HCC)    Atrial fibrillation with RVR ([place]) 07/02/2016   Atrial flutter ([place]) 03/28/2010   a. s/p prior rfca;  b. recurrent paroxysmal flutter 04/2012;  c. pradaxa initiated 04/2012.   BENIGN PROSTATIC HYPERTROPHY 09/29/2006   BPPV (benign paroxysmal positional vertigo) 04/08/2007   CAD (coronary artery disease)    a. LHC  2/17: EF 55-65%, LM 75, pLAD 75, oD1 75, oD2 65, D3 85, oLCx 99, oOM1 75, pRCA 25 >> CABG   Carotid stenosis    a. 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   Diverticulosis    ERECTILE DYSFUNCTION 09/29/2006   GERD (gastroesophageal reflux disease)    GLUCOSE INTOLERANCE 03/19/2010   HYPERLIPIDEMIA 09/29/2006   HYPERTENSION 09/29/2006   Long term (current) use of anticoagulants 04/26/2010   MELANOMA, MALIGNANT, SKIN NOS 09/29/2006   other skin cancers -no further melanoma   OSTEOARTHROSIS NOS, OTHER [facility] SITE 09/29/2006   Other specified forms of hearing loss 08/10/2009   Pneumonia ~ 1969   Presence of permanent cardiac pacemaker 06/02/2016   VENTRICULAR TACHYCARDIA 09/29/2006    Past Surgical History:  Procedure Laterality Date   AV NODE ABLATION  07/02/2016   AV NODE ABLATION N/A 07/02/2016   Procedure: AV Node Ablation;  Surgeon: Deboraha Sprang, MD;  Location: [place] [facility];  Service: Cardiovascular;  Laterality: N/A;   CARDIAC CATHETERIZATION N/A 03/12/2015   Procedure: Left Heart Cath and Coronary Angiography;  Surgeon: [name], MD;  Location: [place] [facility];  Service: Cardiovascular;  Laterality: N/A;   CARDIOVERSION N/A 12/19/2015  Procedure: CARDIOVERSION;  Surgeon: [name], MD;  Location: [facility] ENDOSCOPY;  Service: Cardiovascular;  Laterality: N/A;   CARDIOVERSION N/A 03/24/2016   Procedure: CARDIOVERSION;  Surgeon: [name], MD;  Location: [place];  Service: Cardiovascular;  Laterality: N/A;   CATARACT EXTRACTION W/ INTRAOCULAR LENS  IMPLANT, BILATERAL Bilateral    CLIPPING OF ATRIAL APPENDAGE N/A 03/14/2015   Procedure: CLIPPING OF ATRIAL APPENDAGE;  Surgeon: [name], MD;  Location: [place];  Service: Open Heart Surgery;  Laterality: N/A;   COLONOSCOPY WITH PROPOFOL N/A 12/08/2014   Procedure: COLONOSCOPY WITH PROPOFOL;  Surgeon: [name], MD;  Location: [facility] ENDOSCOPY;  Service: Endoscopy;  Laterality: N/A;   CORONARY ARTERY BYPASS GRAFT N/A 03/14/2015   Procedure: CORONARY ARTERY BYPASS GRAFTING (CABG) x 4 (LIMA to LAD, SVG to DIAGONAL 2, SVG SEQUENTIALLY to OM1 and OM2);  Surgeon: [name], MD;  Location: [place];  Service: Open Heart Surgery;  Laterality: N/A;   INCISION AND DRAINAGE N/A 07/03/2016   Procedure: INCISION AND DRAINAGE of CHEST ABSCESS;  Surgeon: [name], MD;  Location: [place];  Service: Thoracic;  Laterality: N/A;   INSERT / REPLACE / REMOVE PACEMAKER  06/02/2016   JOINT REPLACEMENT     KNEE ARTHROSCOPY Left    MELANOMA EXCISION     "upper back"   PACEMAKER IMPLANT N/A 06/02/2016   Procedure: Pacemaker Implant;  Surgeon: Deboraha Sprang, MD;  Location: [place] [facility];  Service: Cardiovascular;  Laterality: N/A;   SHOULDER OPEN ROTATOR CUFF REPAIR Left    SKIN CANCER EXCISION Right    "cheek; real deep"   TEE WITHOUT CARDIOVERSION N/A 03/14/2015   Procedure: TRANSESOPHAGEAL ECHOCARDIOGRAM (TEE);  Surgeon: [name], MD;  Location: [place];  Service: Open Heart Surgery;  Laterality: N/A;   TEE WITHOUT CARDIOVERSION N/A 12/19/2015   Procedure: TRANSESOPHAGEAL ECHOCARDIOGRAM (TEE);  Surgeon: [name], MD;  Location: [place];  Service: Cardiovascular;  Laterality: N/A;   TEE WITHOUT CARDIOVERSION N/A 03/24/2016   Procedure: TRANSESOPHAGEAL ECHOCARDIOGRAM (TEE);  Surgeon: [name], MD;  Location: [facility] ENDOSCOPY;  Service: Cardiovascular;  Laterality: N/A;   TONSILLECTOMY     TOTAL KNEE ARTHROPLASTY Right      Current Outpatient Medications  Medication Sig Dispense Refill   atorvastatin (LIPITOR) 10 MG tablet TAKE 1 TABLET DAILY 90 tablet 3   b complex vitamins tablet Take 1 tablet by mouth daily.     ELIQUIS 5 MG TABS tablet Take 1 tablet (5 mg total) by mouth 2 (two) times daily. Pt is overdue for follow-up, MUST keep MD appt for FUTURE refills. 60 tablet 1    HYDROcodone-acetaminophen (NORCO) 10-325 MG tablet Take 1 tablet by mouth daily as needed for severe pain. 90 tablet 0   losartan (COZAAR) 50 MG tablet Take 1 tablet (50 mg total) by mouth daily. 90 tablet 3   Multiple Vitamin (MULTIVITAMIN) tablet Take 1 tablet by mouth daily.     omega-3 acid ethyl esters (LOVAZA) 1 g capsule Take by mouth.     pantoprazole (PROTONIX) 40 MG tablet Take 1 tablet (40 mg total) by mouth daily. 90 tablet 3   Psyllium (METAMUCIL PO) Take 15 mLs by mouth 3 (three) times daily as needed (constipation).      tamsulosin (FLOMAX) 0.4 MG CAPS capsule TAKE 1 CAPSULE DAILY 90 capsule 3   No current facility-administered medications for this visit.    Allergies:   Amiodarone hcl and Ace inhibitors  Social History:  The patient  reports that he has quit smoking. His smoking use included cigarettes. He has a 52.00 pack-year smoking history. He has never used smokeless tobacco. He reports that he does not drink alcohol and does not use drugs.   Family History:  The patient's family history includes Diabetes in his brother; Esophageal cancer in his father.    ROS:  Please see the history of present illness.   Otherwise, review of systems are positive for some balance problems- no falls.   All other systems are reviewed and negative.    PHYSICAL EXAM: VS:  BP (!) 150/80   Pulse 84   Ht 5\' 10"  (1.778 m)   Wt 169 lb 9.6 oz (76.9 kg)   SpO2 99%   BMI 24.34 kg/m  , BMI Body mass index is 24.34 kg/m. GEN: Well nourished, well developed, in no acute distress HEENT: normal Neck: no JVD, carotid bruits, or masses Cardiac: RRR; no murmurs, rubs, or gallops,no edema  Respiratory:  clear to auscultation bilaterally, normal work of breathing GI: soft, nontender, nondistended, + BS MS: no deformity or atrophy Skin: warm and dry, no rash Neuro:  Strength and sensation are intact Psych: euthymic mood, full affect   EKG:   The ekg ordered today demonstrates NSR, V-paced  rhythm   Recent Labs: 01/11/2020: TSH 0.79 07/12/2020: ALT 17; BUN 13; Creatinine, Ser 0.87; Hemoglobin 13.6; Platelets 219.0; Potassium 4.0; Sodium 135   Lipid Panel    Component Value Date/Time   CHOL 101 07/12/2020 1418   TRIG 49.0 07/12/2020 1418   HDL 49.60 07/12/2020 1418   CHOLHDL 2 07/12/2020 1418   VLDL 9.8 07/12/2020 1418   LDLCALC 42 07/12/2020 1418   LDLDIRECT 159.2 09/29/2006 1112     Other studies Reviewed: Additional studies/ records that were reviewed today with results demonstrating: labs reviewed.   ASSESSMENT AND PLAN:  CAD: No angina.  COntinue aggressive secondary prevention. LDL 42.  COntinue atorvastatin.  Atrial fibrillation: Eliquis for stroke prevention.  Watch for bleeding issues.  Had nasal vessels cauterized due to nosebleeds.  No further issues since then.  Would have to consider decreasing apixaban if he had bleeding issues.  Currently, he does qualify for the 5 mg twice daily dose.  If his weight were to decrease or his creatinine were to increase, this would need to be reconsidered. HTN: The current medical regimen is effective;  continue present plan and medications. Chronic diastolic heart failure: Appears euvolemic.  Pacer: Follows with EP.  Avoid falls.  Continue using walker.  Watch for any bleeding issues.  Current medicines are reviewed at length with the patient today.  The patient concerns regarding his medicines were addressed.  The following changes have been made:  No change  Labs/ tests ordered today include:  No orders of the defined types were placed in this encounter.   Recommend 150 minutes/week of aerobic exercise Low fat, low carb, high fiber diet recommended  Disposition:   FU in 1 year   Signed, Larae Grooms, MD  11/07/2020 3:52 PM    [facility] [place], [place], [place]  [address] Phone: [phone]; Fax: [phone]

## 2020-11-07 NOTE — Patient Instructions (Signed)
Medication Instructions:  Your physician recommends that you continue on your current medications as directed. Please refer to the Current Medication list given to you today.  *If you need a refill on your cardiac medications before your next appointment, please call your pharmacy*   Lab Work: none If you have labs (blood work) drawn today and your tests are completely normal, you will receive your results only by: [place] (if you have MyChart) OR A paper copy in the mail If you have any lab test that is abnormal or we need to change your treatment, we will call you to review the results.   Testing/Procedures: none   Follow-Up: At [facility], you and your health needs are our priority.  As part of our continuing mission to provide you with exceptional heart care, we have created designated Provider Care Teams.  These Care Teams include your primary Cardiologist (physician) and Advanced Practice Providers (APPs -  Physician Assistants and Nurse Practitioners) who all work together to provide you with the care you need, when you need it.  We recommend signing up for the patient portal called "MyChart".  Sign up information is provided on this After Visit Summary.  MyChart is used to connect with patients for Virtual Visits (Telemedicine).  Patients are able to view lab/test results, encounter notes, upcoming appointments, etc.  Non-urgent messages can be sent to your provider as well.   To learn more about what you can do with MyChart, go to [email or web address].    Your next appointment:   12 month(s)  The format for your next appointment:   In Person  Provider:   You may see Larae Grooms, MD or one of the following Advanced Practice Providers on your designated Care Team:   Melina Copa, PA-C Ermalinda Barrios, PA-C   Other Instructions Please schedule follow up with Dr Belva Chimes team

## 2020-11-12 ENCOUNTER — Other Ambulatory Visit: Payer: Self-pay

## 2020-11-12 ENCOUNTER — Ambulatory Visit (INDEPENDENT_AMBULATORY_CARE_PROVIDER_SITE_OTHER): Payer: Medicare Other

## 2020-11-12 VITALS — BP 150/80 | HR 60 | Temp 98.1°F | Ht 70.0 in | Wt 170.0 lb

## 2020-11-12 DIAGNOSIS — Z0000 Encounter for general adult medical examination without abnormal findings: Secondary | ICD-10-CM

## 2020-11-12 NOTE — Patient Instructions (Signed)
[name] , Thank you for taking time to come for your Medicare Wellness Visit. I appreciate your ongoing commitment to your health goals. Please review the following plan we discussed and let me know if I can assist you in the future.   Screening recommendations/referrals: Colonoscopy: Not a candidate for screening due to age Recommended yearly ophthalmology/optometry visit for glaucoma screening and checkup Recommended yearly dental visit for hygiene and checkup  Vaccinations: Influenza vaccine: 10/19/2020 Pneumococcal vaccine: 03/31/2006, 05/24/2013 Tdap vaccine: 06/19/2016; due every 10 years Shingles vaccine: never done   Covid-19: 03/17/2019, 04/11/2019, 11/22/2019, 08/21/2020  Advanced directives: Please bring a copy of your health care power of attorney and living will to the office at your convenience.  Conditions/risks identified: Yes; Client understands the importance of follow-up with providers by attending scheduled visits and discussed goals to eat healthier, increase physical activity, exercise the brain, socialize more, get enough sleep and make time for laughter.  Next appointment: Please schedule your next Medicare Wellness Visit with your Nurse Health Advisor in 1 year by calling [phone].  Preventive Care 19 Years and Older, Male Preventive care refers to lifestyle choices and visits with your health care provider that can promote health and wellness. What does preventive care include? A yearly physical exam. This is also called an annual well check. Dental exams once or twice a year. Routine eye exams. Ask your health care provider how often you should have your eyes checked. Personal lifestyle choices, including: Daily care of your teeth and gums. Regular physical activity. Eating a healthy diet. Avoiding tobacco and drug use. Limiting alcohol use. Practicing safe sex. Taking low doses of aspirin every day. Taking vitamin and mineral supplements as recommended by  your health care provider. What happens during an annual well check? The services and screenings done by your health care provider during your annual well check will depend on your age, overall health, lifestyle risk factors, and family history of disease. Counseling  Your health care provider may ask you questions about your: Alcohol use. Tobacco use. Drug use. Emotional well-being. Home and relationship well-being. Sexual activity. Eating habits. History of falls. Memory and ability to understand (cognition). Work and work Statistician. Screening  You may have the following tests or measurements: Height, weight, and BMI. Blood pressure. Lipid and cholesterol levels. These may be checked every 5 years, or more frequently if you are over 54 years old. Skin check. Lung cancer screening. You may have this screening every year starting at age 45 if you have a 30-pack-year history of smoking and currently smoke or have quit within the past 15 years. Fecal occult blood test (FOBT) of the stool. You may have this test every year starting at age 66. Flexible sigmoidoscopy or colonoscopy. You may have a sigmoidoscopy every 5 years or a colonoscopy every 10 years starting at age 56. Prostate cancer screening. Recommendations will vary depending on your family history and other risks. Hepatitis C blood test. Hepatitis B blood test. Sexually transmitted disease (STD) testing. Diabetes screening. This is done by checking your blood sugar (glucose) after you have not eaten for a while (fasting). You may have this done every 1-3 years. Abdominal aortic aneurysm (AAA) screening. You may need this if you are a current or former smoker. Osteoporosis. You may be screened starting at age 67 if you are at high risk. Talk with your health care provider about your test results, treatment options, and if necessary, the need for more tests. Vaccines  Your health  care provider may recommend certain vaccines,  such as: Influenza vaccine. This is recommended every year. Tetanus, diphtheria, and acellular pertussis (Tdap, Td) vaccine. You may need a Td booster every 10 years. Zoster vaccine. You may need this after age 76. Pneumococcal 13-valent conjugate (PCV13) vaccine. One dose is recommended after age 35. Pneumococcal polysaccharide (PPSV23) vaccine. One dose is recommended after age 56. Talk to your health care provider about which screenings and vaccines you need and how often you need them. This information is not intended to replace advice given to you by your health care provider. Make sure you discuss any questions you have with your health care provider. Document Released: 02/16/2015 Document Revised: 10/10/2015 Document Reviewed: 11/21/2014 Elsevier Interactive Patient Education  2017 [place] Prevention in the Home Falls can cause injuries. They can happen to people of all ages. There are many things you can do to make your home safe and to help prevent falls. What can I do on the outside of my home? Regularly fix the edges of walkways and driveways and fix any cracks. Remove anything that might make you trip as you walk through a door, such as a raised step or threshold. Trim any bushes or trees on the path to your home. Use bright outdoor lighting. Clear any walking paths of anything that might make someone trip, such as rocks or tools. Regularly check to see if handrails are loose or broken. Make sure that both sides of any steps have handrails. Any raised decks and porches should have guardrails on the edges. Have any leaves, snow, or ice cleared regularly. Use sand or salt on walking paths during winter. Clean up any spills in your garage right away. This includes oil or grease spills. What can I do in the bathroom? Use night lights. Install grab bars by the toilet and in the tub and shower. Do not use towel bars as grab bars. Use non-skid mats or decals in the tub or  shower. If you need to sit down in the shower, use a plastic, non-slip stool. Keep the floor dry. Clean up any water that spills on the floor as soon as it happens. Remove soap buildup in the tub or shower regularly. Attach bath mats securely with double-sided non-slip rug tape. Do not have throw rugs and other things on the floor that can make you trip. What can I do in the bedroom? Use night lights. Make sure that you have a light by your bed that is easy to reach. Do not use any sheets or blankets that are too big for your bed. They should not hang down onto the floor. Have a firm chair that has side arms. You can use this for support while you get dressed. Do not have throw rugs and other things on the floor that can make you trip. What can I do in the kitchen? Clean up any spills right away. Avoid walking on wet floors. Keep items that you use a lot in easy-to-reach places. If you need to reach something above you, use a strong step stool that has a grab bar. Keep electrical cords out of the way. Do not use floor polish or wax that makes floors slippery. If you must use wax, use non-skid floor wax. Do not have throw rugs and other things on the floor that can make you trip. What can I do with my stairs? Do not leave any items on the stairs. Make sure that there are handrails on  both sides of the stairs and use them. Fix handrails that are broken or loose. Make sure that handrails are as long as the stairways. Check any carpeting to make sure that it is firmly attached to the stairs. Fix any carpet that is loose or worn. Avoid having throw rugs at the top or bottom of the stairs. If you do have throw rugs, attach them to the floor with carpet tape. Make sure that you have a light switch at the top of the stairs and the bottom of the stairs. If you do not have them, ask someone to add them for you. What else can I do to help prevent falls? Wear shoes that: Do not have high heels. Have  rubber bottoms. Are comfortable and fit you well. Are closed at the toe. Do not wear sandals. If you use a stepladder: Make sure that it is fully opened. Do not climb a closed stepladder. Make sure that both sides of the stepladder are locked into place. Ask someone to hold it for you, if possible. Clearly mark and make sure that you can see: Any grab bars or handrails. First and last steps. Where the edge of each step is. Use tools that help you move around (mobility aids) if they are needed. These include: Canes. Walkers. Scooters. Crutches. Turn on the lights when you go into a dark area. Replace any light bulbs as soon as they burn out. Set up your furniture so you have a clear path. Avoid moving your furniture around. If any of your floors are uneven, fix them. If there are any pets around you, be aware of where they are. Review your medicines with your doctor. Some medicines can make you feel dizzy. This can increase your chance of falling. Ask your doctor what other things that you can do to help prevent falls. This information is not intended to replace advice given to you by your health care provider. Make sure you discuss any questions you have with your health care provider. Document Released: 11/16/2008 Document Revised: 06/28/2015 Document Reviewed: 02/24/2014 Elsevier Interactive Patient Education  2017 [employer].

## 2020-11-12 NOTE — Progress Notes (Addendum)
Subjective:   RHONIN TROTT is a 87 y.o. male who presents for Medicare Annual/Subsequent preventive examination.  Review of Systems     Cardiac Risk Factors include: advanced age (>23men, >65 women);dyslipidemia;hypertension;male gender     Objective:    There were no vitals filed for this visit. There is no height or weight on file to calculate BMI.  Advanced Directives 11/12/2020 12/08/2019 07/14/2019 06/20/2019 05/30/2019 05/30/2019 05/27/2019  Does Patient Have a Medical Advance Directive? Yes Yes Yes No No No No  Type of Advance Directive - Healthcare Power of [place];Living will [place];Living will - - - -  Does patient want to make changes to medical advance directive? No - Patient declined - No - Patient declined - - - -  Copy of Press photographer in Chart? - - - - - - -  Would patient like information on creating a medical advance directive? - - - No - Patient declined - No - Patient declined -  Pre-existing out of facility DNR order (yellow form or pink MOST form) - - - - - - -    Current Medications (verified) Outpatient Encounter Medications as of 11/12/2020  Medication Sig   atorvastatin (LIPITOR) 10 MG tablet TAKE 1 TABLET DAILY   b complex vitamins tablet Take 1 tablet by mouth daily.   ELIQUIS 5 MG TABS tablet Take 1 tablet (5 mg total) by mouth 2 (two) times daily. Pt is overdue for follow-up, MUST keep MD appt for FUTURE refills.   HYDROcodone-acetaminophen (NORCO) 10-325 MG tablet Take 1 tablet by mouth daily as needed for severe pain.   losartan (COZAAR) 50 MG tablet Take 1 tablet (50 mg total) by mouth daily.   Multiple Vitamin (MULTIVITAMIN) tablet Take 1 tablet by mouth daily.   omega-3 acid ethyl esters (LOVAZA) 1 g capsule Take by mouth.   pantoprazole (PROTONIX) 40 MG tablet Take 1 tablet (40 mg total) by mouth daily.   Psyllium (METAMUCIL PO) Take 15 mLs by mouth 3 (three) times daily as needed (constipation).    tamsulosin  (FLOMAX) 0.4 MG CAPS capsule TAKE 1 CAPSULE DAILY   No facility-administered encounter medications on file as of 11/12/2020.    Allergies (verified) Amiodarone hcl and Ace inhibitors   History: Past Medical History:  Diagnosis Date   ABUSE, ALCOHOL, IN REMISSION 04/08/2007   ALLERGIC RHINITIS 09/29/2006   Arthritis    "hands" (07/02/2016)   Atrial fibrillation (HCC)    Atrial fibrillation with RVR ([place]) 07/02/2016   Atrial flutter ([place]) 03/28/2010   a. s/p prior rfca;  b. recurrent paroxysmal flutter 04/2012;  c. pradaxa initiated 04/2012.   BENIGN PROSTATIC HYPERTROPHY 09/29/2006   BPPV (benign paroxysmal positional vertigo) 04/08/2007   CAD (coronary artery disease)    a. LHC 2/17: EF 55-65%, LM 75, pLAD 75, oD1 75, oD2 65, D3 85, oLCx 99, oOM1 75, pRCA 25 >> CABG   Carotid stenosis    a. 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   Diverticulosis    ERECTILE DYSFUNCTION 09/29/2006   GERD (gastroesophageal reflux disease)    GLUCOSE INTOLERANCE 03/19/2010   HYPERLIPIDEMIA 09/29/2006   HYPERTENSION 09/29/2006   Long term (current) use of anticoagulants 04/26/2010   MELANOMA, MALIGNANT, SKIN NOS 09/29/2006   other skin cancers -no further melanoma   OSTEOARTHROSIS NOS, OTHER [facility] SITE 09/29/2006  Other specified forms of hearing loss 08/10/2009   Pneumonia ~ 1969   Presence of permanent cardiac pacemaker 06/02/2016   VENTRICULAR TACHYCARDIA 09/29/2006   Past Surgical History:  Procedure Laterality Date   AV NODE ABLATION  07/02/2016   AV NODE ABLATION N/A 07/02/2016   Procedure: AV Node Ablation;  Surgeon: Deboraha Sprang, MD;  Location: [place] [facility];  Service: Cardiovascular;  Laterality: N/A;   CARDIAC CATHETERIZATION N/A 03/12/2015   Procedure: Left Heart Cath and Coronary Angiography;  Surgeon: [name], MD;  Location: [place] [facility];  Service: Cardiovascular;   Laterality: N/A;   CARDIOVERSION N/A 12/19/2015   Procedure: CARDIOVERSION;  Surgeon: [name], MD;  Location: [facility] ENDOSCOPY;  Service: Cardiovascular;  Laterality: N/A;   CARDIOVERSION N/A 03/24/2016   Procedure: CARDIOVERSION;  Surgeon: [name], MD;  Location: [place];  Service: Cardiovascular;  Laterality: N/A;   CATARACT EXTRACTION W/ INTRAOCULAR LENS  IMPLANT, BILATERAL Bilateral    CLIPPING OF ATRIAL APPENDAGE N/A 03/14/2015   Procedure: CLIPPING OF ATRIAL APPENDAGE;  Surgeon: [name], MD;  Location: [place];  Service: Open Heart Surgery;  Laterality: N/A;   COLONOSCOPY WITH PROPOFOL N/A 12/08/2014   Procedure: COLONOSCOPY WITH PROPOFOL;  Surgeon: [name], MD;  Location: [facility] ENDOSCOPY;  Service: Endoscopy;  Laterality: N/A;   CORONARY ARTERY BYPASS GRAFT N/A 03/14/2015   Procedure: CORONARY ARTERY BYPASS GRAFTING (CABG) x 4 (LIMA to LAD, SVG to DIAGONAL 2, SVG SEQUENTIALLY to OM1 and OM2);  Surgeon: [name], MD;  Location: [place];  Service: Open Heart Surgery;  Laterality: N/A;   INCISION AND DRAINAGE N/A 07/03/2016   Procedure: INCISION AND DRAINAGE of CHEST ABSCESS;  Surgeon: [name], MD;  Location: [place];  Service: Thoracic;  Laterality: N/A;   INSERT / REPLACE / REMOVE PACEMAKER  06/02/2016   JOINT REPLACEMENT     KNEE ARTHROSCOPY Left    MELANOMA EXCISION     "upper back"   PACEMAKER IMPLANT N/A 06/02/2016   Procedure: Pacemaker Implant;  Surgeon: Deboraha Sprang, MD;  Location: [place] [facility];  Service: Cardiovascular;  Laterality: N/A;   SHOULDER OPEN ROTATOR CUFF REPAIR Left    SKIN CANCER EXCISION Right    "cheek; real deep"   TEE WITHOUT CARDIOVERSION N/A 03/14/2015   Procedure: TRANSESOPHAGEAL ECHOCARDIOGRAM (TEE);  Surgeon: [name], MD;  Location: [place];  Service: Open Heart Surgery;  Laterality: N/A;   TEE WITHOUT CARDIOVERSION N/A 12/19/2015   Procedure: TRANSESOPHAGEAL ECHOCARDIOGRAM (TEE);  Surgeon:  [name], MD;  Location: [place];  Service: Cardiovascular;  Laterality: N/A;   TEE WITHOUT CARDIOVERSION N/A 03/24/2016   Procedure: TRANSESOPHAGEAL ECHOCARDIOGRAM (TEE);  Surgeon: [name], MD;  Location: [facility] ENDOSCOPY;  Service: Cardiovascular;  Laterality: N/A;   TONSILLECTOMY     TOTAL KNEE ARTHROPLASTY Right    Family History  Problem Relation Age of Onset   Diabetes Brother    Esophageal cancer Father    Colon cancer Neg Hx    Stomach cancer Neg Hx    Social History   Socioeconomic History   Marital status: Married    Spouse name: Not on file   Number of children: Not on file   Years of education: Not on file   Highest education level: Not on file  Occupational History   Occupation: Retired, Press photographer  Tobacco Use   Smoking status: Former    Packs/day: 1.00    Years: 52.00  Pack years: 52.00    Types: Cigarettes   Smokeless tobacco: Never   Tobacco comments:    07/02/2016 "quit before 2000"  Vaping Use   Vaping Use: Never used  Substance and Sexual Activity   Alcohol use: No    Comment: 07/02/2016 "quit before 2000"   Drug use: No   Sexual activity: Never  Other Topics Concern   Not on file  Social History Narrative   Married, 3 children. Retired Press photographer. Lives in [place] with  wife and kids. He is retired from Press photographer.    Social Determinants of Health   Financial Resource Strain: Low Risk    Difficulty of Paying Living Expenses: Not hard at all  Food Insecurity: No Food Insecurity   Worried About Charity fundraiser in the Last Year: Never true   [place] in the Last Year: Never true  Transportation Needs: No Transportation Needs   Lack of Transportation (Medical): No   Lack of Transportation (Non-Medical): No  Physical Activity: Sufficiently Active   Days of Exercise per Week: 5 days   Minutes of Exercise per Session: 30 min  Stress: No Stress Concern Present   Feeling of Stress : Not at all  Social Connections: Socially Integrated    Frequency of Communication with Friends and Family: More than three times a week   Frequency of Social Gatherings with Friends and Family: More than three times a week   Attends Religious Services: More than 4 times per year   Active Member of [employer] or Organizations: Yes   Attends Archivist Meetings: More than 4 times per year   Marital Status: Married    Tobacco Counseling Counseling given: Not Answered Tobacco comments: 07/02/2016 "quit before 2000"   Clinical Intake:  Pre-visit preparation completed: Yes  Pain : No/denies pain     BMI - recorded: 24.34 Nutritional Status: BMI of 19-24  Normal Nutritional Risks: Other (Comment) Diabetes: No  How often do you need to have someone help you when you read instructions, pamphlets, or other written materials from your doctor or pharmacy?: 1 - Never What is the last grade level you completed in school?: Bachelor's Degree from [employer]  Diabetic? no  Interpreter Needed?: No  Information entered by :: Lisette Abu, LPN   Activities of Daily Living In your present state of health, do you have any difficulty performing the following activities: 11/12/2020 08/03/2020  Hearing? Tempie Donning  Vision? N Y  Difficulty concentrating or making decisions? N N  Walking or climbing stairs? N N  Dressing or bathing? N N  Doing errands, shopping? N Y  Conservation officer, nature and eating ? N -  Using the Toilet? N -  In the past six months, have you accidently leaked urine? N -  Do you have problems with loss of bowel control? N -  Managing your Medications? N -  Managing your Finances? N -  Housekeeping or managing your Housekeeping? N -  Some recent data might be hidden    Patient Care Team: Biagio Borg, MD as PCP - General (Internal Medicine) Jettie Booze, MD as PCP - Cardiology (Cardiology)  Indicate any recent Medical Services you may have received from other than [facility] providers in the past year (date may be approximate).      Assessment:   This is a routine wellness examination for Kaid.  Hearing/Vision screen Hearing Screening - Comments:: Patient wears hearing aids. Vision Screening - Comments:: Patient wears readers.  Eye  exam done annually by: Dr. Luberta Mutter.  Dietary issues and exercise activities discussed: Current Exercise Habits: Home exercise routine, Type of exercise: walking, Time (Minutes): 30, Frequency (Times/Week): 5, Weekly Exercise (Minutes/Week): 150, Intensity: Mild, Exercise limited by: None identified   Goals Addressed   None   Depression Screen PHQ 2/9 Scores 11/12/2020 08/03/2020 07/14/2019 04/29/2019 07/02/2018 06/30/2017 12/23/2016  PHQ - 2 Score 0 0 0 0 0 0 0    Fall Risk Fall Risk  11/12/2020 08/03/2020 07/14/2019 04/29/2019 07/02/2018  Falls in the past year? 0 0 0 0 0  Number falls in past yr: 0 0 0 - -  Comment - - - - -  Injury with Fall? 0 0 0 - -  Comment - - - - -  Risk for fall due to : No Fall Risks - Orthopedic patient - -  Follow up Falls evaluation completed - Falls evaluation completed;Education provided - -    FALL RISK PREVENTION PERTAINING TO THE HOME:  Any stairs in or around the home? Yes  If so, are there any without handrails? No  Home free of loose throw rugs in walkways, pet beds, electrical cords, etc? Yes  Adequate lighting in your home to reduce risk of falls? Yes   ASSISTIVE DEVICES UTILIZED TO PREVENT FALLS:  Life alert? No  Use of a cane, walker or w/c? No  Grab bars in the bathroom? Yes  Shower chair or bench in shower? Yes  Elevated toilet seat or a handicapped toilet? Yes   TIMED UP AND GO:  Was the test performed? Yes .  Length of time to ambulate 10 feet: 9 sec.   Gait slow and steady without use of assistive device  Cognitive Function:Normal cognitive status assessed by direct observation by this Nurse Health Advisor. No abnormalities found.          Immunizations Immunization History  Administered Date(s) Administered    Fluad Quad(high Dose 65+) 10/08/2018, 11/10/2019, 10/19/2020   H1N1 03/14/2008   Influenza Split 11/11/2010, 10/10/2011   Influenza Whole 01/20/2007, 11/02/2007, 03/16/2009, 11/16/2009   Influenza, High Dose Seasonal PF 10/21/2013, 11/20/2015, 11/25/2016, 11/17/2017   Influenza,inj,Quad PF,6+ Mos 11/09/2012   Influenza-Unspecified 11/24/2014   PFIZER Comirnaty(Gray Top)Covid-19 Tri-Sucrose Vaccine 08/21/2020   PFIZER(Purple Top)SARS-COV-2 Vaccination 03/17/2019, 04/11/2019, 11/22/2019   Pneumococcal Conjugate-13 05/24/2013   Pneumococcal Polysaccharide-23 03/31/2006   Td 03/31/2006   Tdap 06/19/2016    TDAP status: Up to date  Flu Vaccine status: Up to date  Pneumococcal vaccine status: Up to date  Covid-19 vaccine status: Completed vaccines  Qualifies for Shingles Vaccine? Yes   Zostavax completed No   Shingrix Completed?: No.    Education has been provided regarding the importance of this vaccine. Patient has been advised to call insurance company to determine out of pocket expense if they have not yet received this vaccine. Advised may also receive vaccine at local pharmacy or Health Dept. Verbalized acceptance and understanding.  Screening Tests Health Maintenance  Topic Date Due   Zoster Vaccines- Shingrix (1 of 2) Never done   COVID-19 Vaccine (5 - Booster for Pfizer series) 12/22/2020   TETANUS/TDAP  06/20/2026   INFLUENZA VACCINE  Completed   HPV VACCINES  Aged Out    Health Maintenance  Health Maintenance Due  Topic Date Due   Zoster Vaccines- Shingrix (1 of 2) Never done    Colorectal cancer screening: No longer required.   Lung Cancer Screening: (Low Dose CT Chest recommended if Age 70-80 years, 30 pack-year  currently smoking OR have quit w/in 15years.) does not qualify.   Lung Cancer Screening Referral: no  Additional Screening:  Hepatitis C Screening: does not qualify; Completed no  Vision Screening: Recommended annual ophthalmology exams for early  detection of glaucoma and other disorders of the eye. Is the patient up to date with their annual eye exam?  Yes  Who is the provider or what is the name of the office in which the patient attends annual eye exams? Luberta Mutter, MD. If pt is not established with a provider, would they like to be referred to a provider to establish care? No .   Dental Screening: Recommended annual dental exams for proper oral hygiene  Community Resource Referral / Chronic Care Management: CRR required this visit?  No   CCM required this visit?  No      Plan:     I have personally reviewed and noted the following in the patient's chart:   Medical and social history Use of alcohol, tobacco or illicit drugs  Current medications and supplements including opioid prescriptions. Patient is not currently taking opioid prescriptions. Functional ability and status Nutritional status Physical activity Advanced directives List of other physicians Hospitalizations, surgeries, and ER visits in previous 12 months Vitals Screenings to include cognitive, depression, and falls Referrals and appointments  In addition, I have reviewed and discussed with patient certain preventive protocols, quality metrics, and best practice recommendations. A written personalized care plan for preventive services as well as general preventive health recommendations were provided to patient.     Sheral Flow, LPN   [id number]   Nurse Notes:  Hearing Screening - Comments:: Patient wears hearing aids. Vision Screening - Comments:: Patient wears readers.  Eye exam done annually by: Dr. Luberta Mutter.    Medical screening examination/treatment/procedure(s) were performed by non-physician practitioner and as supervising physician I was immediately available for consultation/collaboration.  I agree with above. [name], MD

## 2020-11-19 ENCOUNTER — Ambulatory Visit (INDEPENDENT_AMBULATORY_CARE_PROVIDER_SITE_OTHER): Payer: Medicare Other

## 2020-11-19 DIAGNOSIS — I480 Paroxysmal atrial fibrillation: Secondary | ICD-10-CM

## 2020-11-21 LAB — CUP PACEART REMOTE DEVICE CHECK
Battery Remaining Longevity: 95 mo
Battery Voltage: 2.99 V
Brady Statistic RV Percent Paced: 99.95 %
Date Time Interrogation Session: 20221017171215
Implantable Lead Implant Date: 20180430
Implantable Lead Location: 753860
Implantable Lead Model: 5076
Implantable Pulse Generator Implant Date: 20180430
Lead Channel Impedance Value: 342 Ohm
Lead Channel Impedance Value: 532 Ohm
Lead Channel Pacing Threshold Amplitude: 0.75 V
Lead Channel Pacing Threshold Pulse Width: 0.4 ms
Lead Channel Sensing Intrinsic Amplitude: 5.125 mV
Lead Channel Sensing Intrinsic Amplitude: 5.125 mV
Lead Channel Setting Pacing Amplitude: 2.5 V
Lead Channel Setting Pacing Pulse Width: 0.4 ms
Lead Channel Setting Sensing Sensitivity: 1.2 mV

## 2020-11-28 ENCOUNTER — Other Ambulatory Visit: Payer: Self-pay | Admitting: Internal Medicine

## 2020-11-28 NOTE — Progress Notes (Signed)
Remote pacemaker transmission.   

## 2020-11-29 MED ORDER — HYDROCODONE-ACETAMINOPHEN 10-325 MG PO TABS: 1.0000 | ORAL_TABLET | Freq: Every day | ORAL | 0 refills | Status: DC | PRN

## 2020-12-10 ENCOUNTER — Encounter: Payer: Self-pay | Admitting: Student

## 2020-12-10 ENCOUNTER — Other Ambulatory Visit: Payer: Self-pay

## 2020-12-10 ENCOUNTER — Ambulatory Visit (INDEPENDENT_AMBULATORY_CARE_PROVIDER_SITE_OTHER): Payer: Medicare Other | Admitting: Student

## 2020-12-10 VITALS — BP 128/88 | HR 89 | Ht 70.0 in | Wt 172.0 lb

## 2020-12-10 DIAGNOSIS — I2510 Atherosclerotic heart disease of native coronary artery without angina pectoris: Secondary | ICD-10-CM | POA: Diagnosis not present

## 2020-12-10 DIAGNOSIS — I10 Essential (primary) hypertension: Secondary | ICD-10-CM | POA: Diagnosis not present

## 2020-12-10 DIAGNOSIS — I480 Paroxysmal atrial fibrillation: Secondary | ICD-10-CM

## 2020-12-10 DIAGNOSIS — I5032 Chronic diastolic (congestive) heart failure: Secondary | ICD-10-CM

## 2020-12-10 LAB — CUP PACEART INCLINIC DEVICE CHECK
Battery Remaining Longevity: 95 mo
Battery Voltage: 2.99 V
Brady Statistic RV Percent Paced: 98.98 %
Date Time Interrogation Session: 20221107124420
Implantable Lead Implant Date: 20180430
Implantable Lead Location: 753860
Implantable Lead Model: 5076
Implantable Pulse Generator Implant Date: 20180430
Lead Channel Impedance Value: 361 Ohm
Lead Channel Impedance Value: 551 Ohm
Lead Channel Pacing Threshold Amplitude: 0.875 V
Lead Channel Pacing Threshold Pulse Width: 0.4 ms
Lead Channel Sensing Intrinsic Amplitude: 14 mV
Lead Channel Sensing Intrinsic Amplitude: 6.875 mV
Lead Channel Setting Pacing Amplitude: 2.5 V
Lead Channel Setting Pacing Pulse Width: 0.4 ms
Lead Channel Setting Sensing Sensitivity: 1.2 mV

## 2020-12-10 LAB — BASIC METABOLIC PANEL
BUN/Creatinine Ratio: 15 (ref 10–24)
BUN: 13 mg/dL (ref 10–36)
CO2: 25 mmol/L (ref 20–29)
Calcium: 9.4 mg/dL (ref 8.6–10.2)
Chloride: 99 mmol/L (ref 96–106)
Creatinine, Ser: 0.88 mg/dL (ref 0.76–1.27)
Glucose: 88 mg/dL (ref 70–99)
Potassium: 4.6 mmol/L (ref 3.5–5.2)
Sodium: 136 mmol/L (ref 134–144)
eGFR: 82 mL/min/{1.73_m2} (ref >59)

## 2020-12-10 LAB — CBC
Hematocrit: 39.2 % (ref 37.5–51.0)
Hemoglobin: 13.6 g/dL (ref 13.0–17.7)
MCH: 32.7 pg (ref 26.6–33.0)
MCHC: 34.7 g/dL (ref 31.5–35.7)
MCV: 94 fL (ref 79–97)
Platelets: 249 10*3/uL (ref 150–450)
RBC: 4.16 x10E6/uL (ref 4.14–5.80)
RDW: 11.9 % (ref 11.6–15.4)
WBC: 7.4 10*3/uL (ref 3.4–10.8)

## 2020-12-10 NOTE — Patient Instructions (Signed)
Medication Instructions:  Your physician recommends that you continue on your current medications as directed. Please refer to the Current Medication list given to you today.  *If you need a refill on your cardiac medications before your next appointment, please call your pharmacy*   Lab Work: TODAY: BMET, CBC  If you have labs (blood work) drawn today and your tests are completely normal, you will receive your results only by: MyChart Message (if you have MyChart) OR A paper copy in the mail If you have any lab test that is abnormal or we need to change your treatment, we will call you to review the results.   Follow-Up: At [facility], you and your health needs are our priority.  As part of our continuing mission to provide you with exceptional heart care, we have created designated Provider Care Teams.  These Care Teams include your primary Cardiologist (physician) and Advanced Practice Providers (APPs -  Physician Assistants and Nurse Practitioners) who all work together to provide you with the care you need, when you need it.   Your next appointment:   1 year(s)  The format for your next appointment:   In Person  Provider:   You may see Steven Klein, MD or one of the following Advanced Practice Providers on your designated Care Team:   Renee Ursuy, PA-C Michael "Andy" Tillery, PA-C    

## 2020-12-10 NOTE — Progress Notes (Signed)
Electrophysiology Office Note Date: 12/10/2020  ID:  Jonathan Orozco, DOB May 13, 1928, MRN [id number]  PCP: Biagio Borg, MD Primary Cardiologist: Larae Grooms, MD Electrophysiologist: Virl Axe, MD   CC: Pacemaker follow-up  Jonathan Orozco is a 87 y.o. male seen today for Virl Axe, MD for routine electrophysiology followup.  Since last being seen in our clinic the patient reports doing very well.  he denies chest pain, palpitations, dyspnea, PND, orthopnea, nausea, vomiting, dizziness, syncope, edema, weight gain, or early satiety.  Device information MDT single chamber PPM implanted 06/02/16, Dr. [name] DEVICE DEPENDENT s/p AVNode ablation  Past Medical History:  Diagnosis Date   ABUSE, ALCOHOL, IN REMISSION 04/08/2007   ALLERGIC RHINITIS 09/29/2006   Arthritis    "hands" (07/02/2016)   Atrial fibrillation (HCC)    Atrial fibrillation with RVR ([place]) 07/02/2016   Atrial flutter ([place]) 03/28/2010   a. s/p prior rfca;  b. recurrent paroxysmal flutter 04/2012;  c. pradaxa initiated 04/2012.   BENIGN PROSTATIC HYPERTROPHY 09/29/2006   BPPV (benign paroxysmal positional vertigo) 04/08/2007   CAD (coronary artery disease)    a. LHC 2/17: EF 55-65%, LM 75, pLAD 75, oD1 75, oD2 65, D3 85, oLCx 99, oOM1 75, pRCA 25 >> CABG   Carotid stenosis    a. 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   Diverticulosis    ERECTILE DYSFUNCTION 09/29/2006   GERD (gastroesophageal reflux disease)    GLUCOSE INTOLERANCE 03/19/2010   HYPERLIPIDEMIA 09/29/2006   HYPERTENSION 09/29/2006   Long term (current) use of anticoagulants 04/26/2010   MELANOMA, MALIGNANT, SKIN NOS 09/29/2006   other skin cancers -no further melanoma   OSTEOARTHROSIS NOS, OTHER [facility] SITE 09/29/2006   Other specified forms of hearing loss 08/10/2009   Pneumonia ~ 1969   Presence of permanent cardiac pacemaker 06/02/2016    VENTRICULAR TACHYCARDIA 09/29/2006   Past Surgical History:  Procedure Laterality Date   AV NODE ABLATION  07/02/2016   AV NODE ABLATION N/A 07/02/2016   Procedure: AV Node Ablation;  Surgeon: Deboraha Sprang, MD;  Location: [employer] CV LAB;  Service: Cardiovascular;  Laterality: N/A;   CARDIAC CATHETERIZATION N/A 03/12/2015   Procedure: Left Heart Cath and Coronary Angiography;  Surgeon: [name], MD;  Location: [place] CV LAB;  Service: Cardiovascular;  Laterality: N/A;   CARDIOVERSION N/A 12/19/2015   Procedure: CARDIOVERSION;  Surgeon: [name], MD;  Location: [facility] ENDOSCOPY;  Service: Cardiovascular;  Laterality: N/A;   CARDIOVERSION N/A 03/24/2016   Procedure: CARDIOVERSION;  Surgeon: [name], MD;  Location: [place];  Service: Cardiovascular;  Laterality: N/A;   CATARACT EXTRACTION W/ INTRAOCULAR LENS  IMPLANT, BILATERAL Bilateral    CLIPPING OF ATRIAL APPENDAGE N/A 03/14/2015   Procedure: CLIPPING OF ATRIAL APPENDAGE;  Surgeon: [name], MD;  Location: [place];  Service: Open Heart Surgery;  Laterality: N/A;   COLONOSCOPY WITH PROPOFOL N/A 12/08/2014   Procedure: COLONOSCOPY WITH PROPOFOL;  Surgeon: [name], MD;  Location: [facility] ENDOSCOPY;  Service: Endoscopy;  Laterality: N/A;   CORONARY ARTERY BYPASS GRAFT N/A 03/14/2015   Procedure: CORONARY ARTERY BYPASS GRAFTING (CABG) x 4 (LIMA to LAD, SVG to DIAGONAL 2, SVG SEQUENTIALLY to OM1 and OM2);  Surgeon: [name], MD;  Location: [place];  Service: Open Heart Surgery;  Laterality: N/A;   INCISION AND DRAINAGE N/A 07/03/2016  Procedure: INCISION AND DRAINAGE of CHEST ABSCESS;  Surgeon: [name], MD;  Location: [place];  Service: Thoracic;  Laterality: N/A;   INSERT / REPLACE / REMOVE PACEMAKER  06/02/2016   JOINT REPLACEMENT     KNEE ARTHROSCOPY Left    MELANOMA EXCISION     "upper back"   PACEMAKER IMPLANT N/A 06/02/2016   Procedure: Pacemaker Implant;  Surgeon: Deboraha Sprang, MD;   Location: [place] CV LAB;  Service: Cardiovascular;  Laterality: N/A;   SHOULDER OPEN ROTATOR CUFF REPAIR Left    SKIN CANCER EXCISION Right    "cheek; real deep"   TEE WITHOUT CARDIOVERSION N/A 03/14/2015   Procedure: TRANSESOPHAGEAL ECHOCARDIOGRAM (TEE);  Surgeon: [name], MD;  Location: [place];  Service: Open Heart Surgery;  Laterality: N/A;   TEE WITHOUT CARDIOVERSION N/A 12/19/2015   Procedure: TRANSESOPHAGEAL ECHOCARDIOGRAM (TEE);  Surgeon: [name], MD;  Location: [place];  Service: Cardiovascular;  Laterality: N/A;   TEE WITHOUT CARDIOVERSION N/A 03/24/2016   Procedure: TRANSESOPHAGEAL ECHOCARDIOGRAM (TEE);  Surgeon: [name], MD;  Location: [facility] ENDOSCOPY;  Service: Cardiovascular;  Laterality: N/A;   TONSILLECTOMY     TOTAL KNEE ARTHROPLASTY Right     Current Outpatient Medications  Medication Sig Dispense Refill   atorvastatin (LIPITOR) 10 MG tablet TAKE 1 TABLET DAILY 90 tablet 3   b complex vitamins tablet Take 1 tablet by mouth daily.     ELIQUIS 5 MG TABS tablet Take 1 tablet (5 mg total) by mouth 2 (two) times daily. Pt is overdue for follow-up, MUST keep MD appt for FUTURE refills. 60 tablet 1   HYDROcodone-acetaminophen (NORCO) 10-325 MG tablet Take 1 tablet by mouth daily as needed for severe pain. 90 tablet 0   losartan (COZAAR) 50 MG tablet Take 1 tablet (50 mg total) by mouth daily. 90 tablet 3   Multiple Vitamin (MULTIVITAMIN) tablet Take 1 tablet by mouth daily.     omega-3 acid ethyl esters (LOVAZA) 1 g capsule Take by mouth.     pantoprazole (PROTONIX) 40 MG tablet Take 1 tablet (40 mg total) by mouth daily. 90 tablet 3   Psyllium (METAMUCIL PO) Take 15 mLs by mouth 3 (three) times daily as needed (constipation).      tamsulosin (FLOMAX) 0.4 MG CAPS capsule TAKE 1 CAPSULE DAILY 90 capsule 3   No current facility-administered medications for this visit.    Allergies:   Amiodarone hcl and Ace inhibitors   Social History: Social  History   Socioeconomic History   Marital status: Married    Spouse name: Not on file   Number of children: Not on file   Years of education: Not on file   Highest education level: Not on file  Occupational History   Occupation: Retired, Press photographer  Tobacco Use   Smoking status: Former    Packs/day: 1.00    Years: 52.00    Pack years: 52.00    Types: Cigarettes   Smokeless tobacco: Never   Tobacco comments:    07/02/2016 "quit before 2000"  Vaping Use   Vaping Use: Never used  Substance and Sexual Activity   Alcohol use: No    Comment: 07/02/2016 "quit before 2000"   Drug use: No   Sexual activity: Never  Other Topics Concern   Not on file  Social History Narrative   Married, 3 children. Retired Press photographer. Lives in [place] with  wife and kids. He is retired from Press photographer.    Social Determinants of  Health   Financial Resource Strain: Low Risk    Difficulty of Paying Living Expenses: Not hard at all  Food Insecurity: No Food Insecurity   Worried About [place] in the Last Year: Never true   Ran Out of Food in the Last Year: Never true  Transportation Needs: No Transportation Needs   Lack of Transportation (Medical): No   Lack of Transportation (Non-Medical): No  Physical Activity: Sufficiently Active   Days of Exercise per Week: 5 days   Minutes of Exercise per Session: 30 min  Stress: No Stress Concern Present   Feeling of Stress : Not at all  Social Connections: Socially Integrated   Frequency of Communication with Friends and Family: More than three times a week   Frequency of Social Gatherings with Friends and Family: More than three times a week   Attends Religious Services: More than 4 times per year   Active Member of [employer] or Organizations: Yes   Attends Music therapist: More than 4 times per year   Marital Status: Married  Human resources officer Violence: Not At Risk   Fear of Current or Ex-Partner: No   Emotionally Abused: No   Physically Abused: No    Sexually Abused: No    Family History: Family History  Problem Relation Age of Onset   Diabetes Brother    Esophageal cancer Father    Colon cancer Neg Hx    Stomach cancer Neg Hx      Review of Systems: All other systems reviewed and are otherwise negative except as noted above.  Physical Exam: Vitals:   12/10/20 1152  BP: 128/88  Pulse: 89  SpO2: 98%  Weight: 172 lb (78 kg)  Height: 5\' 10"  (1.778 m)     GEN- The patient is well appearing, alert and oriented x 3 today.   HEENT: normocephalic, atraumatic; sclera clear, conjunctiva pink; hearing intact; oropharynx clear; neck supple  Lungs- Clear to ausculation bilaterally, normal work of breathing.  No wheezes, rales, rhonchi Heart- Regular rate and rhythm, no murmurs, rubs or gallops  GI- soft, non-tender, non-distended, bowel sounds present  Extremities- no clubbing or cyanosis. No edema MS- no significant deformity or atrophy Skin- warm and dry, no rash or lesion; PPM pocket well healed Psych- euthymic mood, full affect Neuro- strength and sensation are intact  PPM Interrogation- reviewed in detail today,  See PACEART report  EKG:  EKG is not ordered today. EKG 11/07/20 NSR/V paced at 84 bpm, QRS 168ms   Recent Labs: 01/11/2020: TSH 0.79 07/12/2020: ALT 17; BUN 13; Creatinine, Ser 0.87; Hemoglobin 13.6; Platelets 219.0; Potassium 4.0; Sodium 135   Wt Readings from Last 3 Encounters:  12/10/20 172 lb (78 kg)  11/12/20 170 lb (77.1 kg)  11/07/20 169 lb 9.6 oz (76.9 kg)     Other studies Reviewed: Additional studies/ records that were reviewed today include: Previous EP office notes, Previous remote checks, Most recent labwork.   Assessment and Plan:  1. Symptomatic bradycardia s/p Medtronic PPM  Normal PPM function See Pace Art report No changes today  2. Persistent uncontrolled AFib/atypical Aflutter s/p AVNode ablation (LA clipping) Continue Eliquis for CHA2DS2Vasc is 4     3. CAD s/p CABG Feb 2017 No  anginal complaints, on statin, lipids are monitored and managed with his PMD No ASA given Eliquis   4. HTN Stable on current regimen    5. HFpEF No symptoms or exam findings to suggest volume OL Conservative management.  Current  medicines are reviewed at length with the patient today.    Labs/ tests ordered today include:  Orders Placed This Encounter  Procedures   Basic metabolic panel   CBC   Disposition:   Follow up with Dr. [name] in 12 months   Signed, Shirley Friar, PA-C  12/10/2020 12:45 PM  [place] [address] [place] [address] [phone] (office) [phone] (fax)

## 2021-01-01 ENCOUNTER — Other Ambulatory Visit: Payer: Self-pay | Admitting: Internal Medicine

## 2021-01-01 MED ORDER — HYDROCODONE-ACETAMINOPHEN 10-325 MG PO TABS: 1.0000 | ORAL_TABLET | Freq: Every day | ORAL | 0 refills | Status: DC | PRN

## 2021-01-04 ENCOUNTER — Other Ambulatory Visit: Payer: Self-pay | Admitting: Internal Medicine

## 2021-01-04 NOTE — Telephone Encounter (Signed)
Please refill as per office routine med refill policy (all routine meds to be refilled for 3 mo or monthly (per pt preference) up to one year from last visit, then month to month grace period for 3 mo, then further med refills will have to be denied) ? ?

## 2021-01-11 ENCOUNTER — Other Ambulatory Visit: Payer: Self-pay | Admitting: Interventional Cardiology

## 2021-01-11 DIAGNOSIS — I4891 Unspecified atrial fibrillation: Secondary | ICD-10-CM

## 2021-01-11 DIAGNOSIS — Z7901 Long term (current) use of anticoagulants: Secondary | ICD-10-CM

## 2021-01-11 NOTE — Telephone Encounter (Signed)
Eliquis 5mg  refill request received. Patient is 87 years old, weight-78kg, Crea-0.88 on 12/10/2020, Diagnosis-Afib, and last seen by Oda Kilts on 12/10/2020. Dose is appropriate based on dosing criteria. Will send in refill to requested pharmacy.

## 2021-02-05 ENCOUNTER — Other Ambulatory Visit: Payer: Self-pay | Admitting: Internal Medicine

## 2021-02-05 MED ORDER — HYDROCODONE-ACETAMINOPHEN 10-325 MG PO TABS: 1.0000 | ORAL_TABLET | Freq: Every day | ORAL | 0 refills | Status: DC | PRN

## 2021-02-12 ENCOUNTER — Telehealth: Payer: Self-pay

## 2021-02-12 MED ORDER — HYDROCODONE-ACETAMINOPHEN 10-325 MG PO TABS: 1.0000 | ORAL_TABLET | Freq: Every day | ORAL | 0 refills | Status: DC | PRN

## 2021-02-12 NOTE — Telephone Encounter (Signed)
Pt wife is calling to request a new Rx for HYDROcodone-acetaminophen (NORCO) 10-325 MG tablet  To be sent to a different Pharmacy due to them being out of stock.  Pt wife requesting it be sent to [facility]. She has called to verify that they have it in stock. [facility]

## 2021-02-12 NOTE — Telephone Encounter (Signed)
Done erx 

## 2021-02-15 ENCOUNTER — Other Ambulatory Visit: Payer: Self-pay | Admitting: Internal Medicine

## 2021-02-15 NOTE — Telephone Encounter (Signed)
Please refill as per office routine med refill policy (all routine meds to be refilled for 3 mo or monthly (per pt preference) up to one year from last visit, then month to month grace period for 3 mo, then further med refills will have to be denied) ? ?

## 2021-02-18 ENCOUNTER — Ambulatory Visit (INDEPENDENT_AMBULATORY_CARE_PROVIDER_SITE_OTHER): Payer: Medicare Other

## 2021-02-18 DIAGNOSIS — I480 Paroxysmal atrial fibrillation: Secondary | ICD-10-CM

## 2021-02-19 LAB — CUP PACEART REMOTE DEVICE CHECK
Battery Remaining Longevity: 92 mo
Battery Voltage: 2.99 V
Brady Statistic RV Percent Paced: 99.93 %
Date Time Interrogation Session: 20230116101551
Implantable Lead Implant Date: 20180430
Implantable Lead Location: 753860
Implantable Lead Model: 5076
Implantable Pulse Generator Implant Date: 20180430
Lead Channel Impedance Value: 342 Ohm
Lead Channel Impedance Value: 513 Ohm
Lead Channel Pacing Threshold Amplitude: 0.875 V
Lead Channel Pacing Threshold Pulse Width: 0.4 ms
Lead Channel Sensing Intrinsic Amplitude: 14 mV
Lead Channel Sensing Intrinsic Amplitude: 6.875 mV
Lead Channel Setting Pacing Amplitude: 2.5 V
Lead Channel Setting Pacing Pulse Width: 0.4 ms
Lead Channel Setting Sensing Sensitivity: 1.2 mV

## 2021-02-28 NOTE — Progress Notes (Signed)
Remote pacemaker transmission.   

## 2021-03-19 ENCOUNTER — Telehealth: Payer: Self-pay | Admitting: Internal Medicine

## 2021-03-19 MED ORDER — HYDROCODONE-ACETAMINOPHEN 10-325 MG PO TABS: 1.0000 | ORAL_TABLET | Freq: Every day | ORAL | 0 refills | Status: DC | PRN

## 2021-03-19 NOTE — Telephone Encounter (Signed)
1.Medication Requested: HYDROcodone-acetaminophen (NORCO) 10-325 MG tablet  2. Pharmacy (Name, Street, [place]): [facility] [place]  3. On Med List: yes  4. Last Visit with PCP: 10-19-2020  5. Next visit date with PCP: n/a   Agent: Please be advised that RX refills may take up to 3 business days. We ask that you follow-up with your pharmacy.

## 2021-04-23 ENCOUNTER — Other Ambulatory Visit: Payer: Self-pay | Admitting: Internal Medicine

## 2021-04-23 MED ORDER — HYDROCODONE-ACETAMINOPHEN 10-325 MG PO TABS: 1.0000 | ORAL_TABLET | Freq: Every day | ORAL | 0 refills | Status: DC | PRN

## 2021-05-20 ENCOUNTER — Ambulatory Visit (INDEPENDENT_AMBULATORY_CARE_PROVIDER_SITE_OTHER): Payer: Medicare Other

## 2021-05-20 DIAGNOSIS — I472 Ventricular tachycardia, unspecified: Secondary | ICD-10-CM

## 2021-05-20 DIAGNOSIS — I4729 Other ventricular tachycardia: Secondary | ICD-10-CM | POA: Diagnosis not present

## 2021-05-22 LAB — CUP PACEART REMOTE DEVICE CHECK
Battery Remaining Longevity: 91 mo
Battery Voltage: 2.99 V
Brady Statistic RV Percent Paced: 99.87 %
Date Time Interrogation Session: 20230417081030
Implantable Lead Implant Date: 20180430
Implantable Lead Location: 753860
Implantable Lead Model: 5076
Implantable Pulse Generator Implant Date: 20180430
Lead Channel Impedance Value: 361 Ohm
Lead Channel Impedance Value: 532 Ohm
Lead Channel Pacing Threshold Amplitude: 0.75 V
Lead Channel Pacing Threshold Pulse Width: 0.4 ms
Lead Channel Sensing Intrinsic Amplitude: 14 mV
Lead Channel Sensing Intrinsic Amplitude: 6.875 mV
Lead Channel Setting Pacing Amplitude: 2.5 V
Lead Channel Setting Pacing Pulse Width: 0.4 ms
Lead Channel Setting Sensing Sensitivity: 1.2 mV

## 2021-05-24 ENCOUNTER — Other Ambulatory Visit: Payer: Self-pay | Admitting: Internal Medicine

## 2021-05-24 MED ORDER — HYDROCODONE-ACETAMINOPHEN 10-325 MG PO TABS: 1.0000 | ORAL_TABLET | Freq: Every day | ORAL | 0 refills | Status: DC | PRN

## 2021-05-24 NOTE — Telephone Encounter (Signed)
Done erx 

## 2021-06-07 NOTE — Progress Notes (Signed)
Remote pacemaker transmission.   

## 2021-06-24 ENCOUNTER — Other Ambulatory Visit: Payer: Self-pay | Admitting: Internal Medicine

## 2021-06-24 MED ORDER — HYDROCODONE-ACETAMINOPHEN 10-325 MG PO TABS: 1.0000 | ORAL_TABLET | Freq: Every day | ORAL | 0 refills | Status: DC | PRN

## 2021-06-24 NOTE — Telephone Encounter (Signed)
LOV: 10/19/20

## 2021-07-18 ENCOUNTER — Other Ambulatory Visit: Payer: Self-pay | Admitting: Interventional Cardiology

## 2021-07-18 DIAGNOSIS — I4891 Unspecified atrial fibrillation: Secondary | ICD-10-CM

## 2021-07-18 DIAGNOSIS — Z7901 Long term (current) use of anticoagulants: Secondary | ICD-10-CM

## 2021-07-18 NOTE — Telephone Encounter (Signed)
Prescription refill request for Eliquis received. Indication: Afib  Last office visit: 12/10/20 Chalmers Cater) Scr: 0.88 (12/10/20)  Age: 88 Weight: 78kg  Appropriate dose and refill sent to requested pharmacy.

## 2021-07-25 ENCOUNTER — Other Ambulatory Visit: Payer: Self-pay | Admitting: Internal Medicine

## 2021-07-25 MED ORDER — HYDROCODONE-ACETAMINOPHEN 10-325 MG PO TABS: 1.0000 | ORAL_TABLET | Freq: Every day | ORAL | 0 refills | Status: DC | PRN

## 2021-08-19 ENCOUNTER — Ambulatory Visit (INDEPENDENT_AMBULATORY_CARE_PROVIDER_SITE_OTHER): Payer: Medicare Other

## 2021-08-19 DIAGNOSIS — I480 Paroxysmal atrial fibrillation: Secondary | ICD-10-CM | POA: Diagnosis not present

## 2021-08-21 LAB — CUP PACEART REMOTE DEVICE CHECK
Battery Remaining Longevity: 89 mo
Battery Voltage: 2.98 V
Brady Statistic RV Percent Paced: 99.92 %
Date Time Interrogation Session: 20230718120323
Implantable Lead Implant Date: 20180430
Implantable Lead Location: 753860
Implantable Lead Model: 5076
Implantable Pulse Generator Implant Date: 20180430
Lead Channel Impedance Value: 361 Ohm
Lead Channel Impedance Value: 570 Ohm
Lead Channel Pacing Threshold Amplitude: 0.75 V
Lead Channel Pacing Threshold Pulse Width: 0.4 ms
Lead Channel Sensing Intrinsic Amplitude: 14.875 mV
Lead Channel Sensing Intrinsic Amplitude: 14.875 mV
Lead Channel Setting Pacing Amplitude: 2.5 V
Lead Channel Setting Pacing Pulse Width: 0.4 ms
Lead Channel Setting Sensing Sensitivity: 1.2 mV

## 2021-08-26 ENCOUNTER — Other Ambulatory Visit: Payer: Self-pay | Admitting: Internal Medicine

## 2021-08-26 MED ORDER — HYDROCODONE-ACETAMINOPHEN 10-325 MG PO TABS: 1.0000 | ORAL_TABLET | Freq: Every day | ORAL | 0 refills | Status: DC | PRN

## 2021-08-29 ENCOUNTER — Telehealth: Payer: Self-pay

## 2021-08-29 ENCOUNTER — Ambulatory Visit (INDEPENDENT_AMBULATORY_CARE_PROVIDER_SITE_OTHER): Payer: Medicare Other | Admitting: Internal Medicine

## 2021-08-29 VITALS — BP 132/84 | HR 90 | Temp 97.6°F | Ht 70.0 in | Wt 174.1 lb

## 2021-08-29 DIAGNOSIS — E559 Vitamin D deficiency, unspecified: Secondary | ICD-10-CM

## 2021-08-29 DIAGNOSIS — R269 Unspecified abnormalities of gait and mobility: Secondary | ICD-10-CM | POA: Diagnosis not present

## 2021-08-29 DIAGNOSIS — K589 Irritable bowel syndrome without diarrhea: Secondary | ICD-10-CM

## 2021-08-29 DIAGNOSIS — H6123 Impacted cerumen, bilateral: Secondary | ICD-10-CM

## 2021-08-29 DIAGNOSIS — N401 Enlarged prostate with lower urinary tract symptoms: Secondary | ICD-10-CM

## 2021-08-29 DIAGNOSIS — E782 Mixed hyperlipidemia: Secondary | ICD-10-CM

## 2021-08-29 DIAGNOSIS — E538 Deficiency of other specified B group vitamins: Secondary | ICD-10-CM

## 2021-08-29 DIAGNOSIS — R739 Hyperglycemia, unspecified: Secondary | ICD-10-CM

## 2021-08-29 DIAGNOSIS — Z0001 Encounter for general adult medical examination with abnormal findings: Secondary | ICD-10-CM | POA: Diagnosis not present

## 2021-08-29 DIAGNOSIS — R350 Frequency of micturition: Secondary | ICD-10-CM

## 2021-08-29 LAB — BASIC METABOLIC PANEL
BUN: 13 mg/dL (ref 6–23)
CO2: 29 mEq/L (ref 19–32)
Calcium: 9.5 mg/dL (ref 8.4–10.5)
Chloride: 99 mEq/L (ref 96–112)
Creatinine, Ser: 0.87 mg/dL (ref 0.40–1.50)
GFR: 75.4 mL/min (ref >60.00)
Glucose, Bld: 103 mg/dL — ABNORMAL HIGH (ref 70–99)
Potassium: 4.7 mEq/L (ref 3.5–5.1)
Sodium: 134 mEq/L — ABNORMAL LOW (ref 135–145)

## 2021-08-29 LAB — CBC WITH DIFFERENTIAL/PLATELET
Basophils Absolute: 0 10*3/uL (ref 0.0–0.1)
Basophils Relative: 0.2 % (ref 0.0–3.0)
Eosinophils Absolute: 0.1 10*3/uL (ref 0.0–0.7)
Eosinophils Relative: 1.4 % (ref 0.0–5.0)
HCT: 42.3 % (ref 39.0–52.0)
Hemoglobin: 14.3 g/dL (ref 13.0–17.0)
Lymphocytes Relative: 45.1 % (ref 12.0–46.0)
Lymphs Abs: 3.2 10*3/uL (ref 0.7–4.0)
MCHC: 33.7 g/dL (ref 30.0–36.0)
MCV: 97.2 fl (ref 78.0–100.0)
Monocytes Absolute: 0.7 10*3/uL (ref 0.1–1.0)
Monocytes Relative: 10.4 % (ref 3.0–12.0)
Neutro Abs: 3 10*3/uL (ref 1.4–7.7)
Neutrophils Relative %: 42.9 % — ABNORMAL LOW (ref 43.0–77.0)
Platelets: 204 10*3/uL (ref 150.0–400.0)
RBC: 4.35 Mil/uL (ref 4.22–5.81)
RDW: 13.2 % (ref 11.5–15.5)
WBC: 7 10*3/uL (ref 4.0–10.5)

## 2021-08-29 LAB — URINALYSIS, ROUTINE W REFLEX MICROSCOPIC
Bilirubin Urine: NEGATIVE
Hgb urine dipstick: NEGATIVE
Ketones, ur: NEGATIVE
Leukocytes,Ua: NEGATIVE
Nitrite: NEGATIVE
RBC / HPF: NONE SEEN (ref 0–?)
Specific Gravity, Urine: 1.015 (ref 1.000–1.030)
Total Protein, Urine: NEGATIVE
Urine Glucose: NEGATIVE
Urobilinogen, UA: 0.2 (ref 0.0–1.0)
pH: 6.5 (ref 5.0–8.0)

## 2021-08-29 LAB — LIPID PANEL
Cholesterol: 153 mg/dL (ref 0–200)
HDL: 56.1 mg/dL (ref >39.00)
LDL Cholesterol: 84 mg/dL (ref 0–99)
NonHDL: 96.41
Total CHOL/HDL Ratio: 3
Triglycerides: 63 mg/dL (ref 0.0–149.0)
VLDL: 12.6 mg/dL (ref 0.0–40.0)

## 2021-08-29 LAB — VITAMIN D 25 HYDROXY (VIT D DEFICIENCY, FRACTURES): VITD: 45.52 ng/mL (ref 30.00–100.00)

## 2021-08-29 LAB — HEMOGLOBIN A1C: Hgb A1c MFr Bld: 5.9 % (ref 4.6–6.5)

## 2021-08-29 LAB — HEPATIC FUNCTION PANEL
ALT: 12 U/L (ref 0–53)
AST: 17 U/L (ref 0–37)
Albumin: 4.2 g/dL (ref 3.5–5.2)
Alkaline Phosphatase: 95 U/L (ref 39–117)
Bilirubin, Direct: 0.2 mg/dL (ref 0.0–0.3)
Total Bilirubin: 1.2 mg/dL (ref 0.2–1.2)
Total Protein: 7.5 g/dL (ref 6.0–8.3)

## 2021-08-29 LAB — TSH: TSH: 0.77 u[IU]/mL (ref 0.35–5.50)

## 2021-08-29 LAB — VITAMIN B12: Vitamin B-12: 435 pg/mL (ref 211–911)

## 2021-08-29 MED ORDER — TAMSULOSIN HCL 0.4 MG PO CAPS: 0.4000 mg | ORAL_CAPSULE | Freq: Two times a day (BID) | ORAL | 3 refills | Status: DC

## 2021-08-29 NOTE — Progress Notes (Signed)
Patient ID: Jonathan Orozco, male   DOB: 13-Jan-1929, 88 y.o.   MRN: [id number]         Chief Complaint:: wellness exam and worsening bilateral hearing over baseline, constipation, gait worsening, BPH       HPI:  Jonathan Orozco is a 88 y.o. male here for wellness exam; declines shingrix, o/w up to date                        Also has worsening urinary stream, asks for increased med tx and urology.  Has worsening bilateral hearing in the past 2 wks over baseline, suspects possible wax agai.  Has persistent constipation in the past 2 mo, and dulcolox otc seems too harsh.  Also has worsening gait and balance with several near falls, asks for rollater.  Pt denies chest pain, increased sob or doe, wheezing, orthopnea, PND, increased LE swelling, palpitations, dizziness or syncope.   Pt denies polydipsia, polyuria, or new focal neuro s/s.    Pt denies fever, wt loss, night sweats, loss of appetite, or other constitutional symptoms     Wt Readings from Last 3 Encounters:  08/29/21 174 lb 2 oz (79 kg)  12/10/20 172 lb (78 kg)  11/12/20 170 lb (77.1 kg)   BP Readings from Last 3 Encounters:  08/29/21 132/84  12/10/20 128/88  11/12/20 (!) 150/80   Immunization History  Administered Date(s) Administered   Fluad Quad(high Dose 65+) 10/08/2018, 11/10/2019, 10/19/2020   H1N1 03/14/2008   Influenza Split 11/11/2010, 10/10/2011   Influenza Whole 01/20/2007, 11/02/2007, 03/16/2009, 11/16/2009   Influenza, High Dose Seasonal PF 10/21/2013, 11/20/2015, 11/25/2016, 11/17/2017   Influenza,inj,Quad PF,6+ Mos 11/09/2012   Influenza-Unspecified 11/24/2014   PFIZER Comirnaty(Gray Top)Covid-19 Tri-Sucrose Vaccine 08/21/2020   PFIZER(Purple Top)SARS-COV-2 Vaccination 03/17/2019, 04/11/2019, 11/22/2019   Pneumococcal Conjugate-13 05/24/2013   Pneumococcal Polysaccharide-23 03/31/2006   Td 03/31/2006   Tdap 06/19/2016   There are no preventive care reminders to display for this patient.     Past Medical History:   Diagnosis Date   ABUSE, ALCOHOL, IN REMISSION 04/08/2007   ALLERGIC RHINITIS 09/29/2006   Arthritis    "hands" (07/02/2016)   Atrial fibrillation (HCC)    Atrial fibrillation with RVR ([place]) 07/02/2016   Atrial flutter ([place]) 03/28/2010   a. s/p prior rfca;  b. recurrent paroxysmal flutter 04/2012;  c. pradaxa initiated 04/2012.   BENIGN PROSTATIC HYPERTROPHY 09/29/2006   BPPV (benign paroxysmal positional vertigo) 04/08/2007   CAD (coronary artery disease)    a. LHC 2/17: EF 55-65%, LM 75, pLAD 75, oD1 75, oD2 65, D3 85, oLCx 99, oOM1 75, pRCA 25 >> CABG   Carotid stenosis    a. 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   Diverticulosis    ERECTILE DYSFUNCTION 09/29/2006   GERD (gastroesophageal reflux disease)    GLUCOSE INTOLERANCE 03/19/2010   HYPERLIPIDEMIA 09/29/2006   HYPERTENSION 09/29/2006   Long term (current) use of anticoagulants 04/26/2010   MELANOMA, MALIGNANT, SKIN NOS 09/29/2006   other skin cancers -no further melanoma   OSTEOARTHROSIS NOS, OTHER [facility] SITE 09/29/2006   Other specified forms of hearing loss 08/10/2009   Pneumonia ~ 1969   Presence of permanent cardiac pacemaker 06/02/2016   VENTRICULAR TACHYCARDIA 09/29/2006   Past Surgical History:  Procedure Laterality Date   AV NODE ABLATION  07/02/2016   AV  NODE ABLATION N/A 07/02/2016   Procedure: AV Node Ablation;  Surgeon: Deboraha Sprang, MD;  Location: [place] CV LAB;  Service: Cardiovascular;  Laterality: N/A;   CARDIAC CATHETERIZATION N/A 03/12/2015   Procedure: Left Heart Cath and Coronary Angiography;  Surgeon: [name], MD;  Location: [place] CV LAB;  Service: Cardiovascular;  Laterality: N/A;   CARDIOVERSION N/A 12/19/2015   Procedure: CARDIOVERSION;  Surgeon: [name], MD;  Location[facility] ENDOSCOPY;  Service: Cardiovascular;  Laterality: N/A;   CARDIOVERSION N/A 03/24/2016   Procedure:  CARDIOVERSION;  Surgeon: [name], MD;  Location: [place];  Service: Cardiovascular;  Laterality: N/A;   CATARACT EXTRACTION W/ INTRAOCULAR LENS  IMPLANT, BILATERAL Bilateral    CLIPPING OF ATRIAL APPENDAGE N/A 03/14/2015   Procedure: CLIPPING OF ATRIAL APPENDAGE;  Surgeon: [name], MD;  Location: [place];  Service: Open Heart Surgery;  Laterality: N/A;   COLONOSCOPY WITH PROPOFOL N/A 12/08/2014   Procedure: COLONOSCOPY WITH PROPOFOL;  Surgeon: [name], MD;  Location: [facility] ENDOSCOPY;  Service: Endoscopy;  Laterality: N/A;   CORONARY ARTERY BYPASS GRAFT N/A 03/14/2015   Procedure: CORONARY ARTERY BYPASS GRAFTING (CABG) x 4 (LIMA to LAD, SVG to DIAGONAL 2, SVG SEQUENTIALLY to OM1 and OM2);  Surgeon: [name], MD;  Location: [place];  Service: Open Heart Surgery;  Laterality: N/A;   INCISION AND DRAINAGE N/A 07/03/2016   Procedure: INCISION AND DRAINAGE of CHEST ABSCESS;  Surgeon: [name], MD;  Location: [place];  Service: Thoracic;  Laterality: N/A;   INSERT / REPLACE / REMOVE PACEMAKER  06/02/2016   JOINT REPLACEMENT     KNEE ARTHROSCOPY Left    MELANOMA EXCISION     "upper back"   PACEMAKER IMPLANT N/A 06/02/2016   Procedure: Pacemaker Implant;  Surgeon: Deboraha Sprang, MD;  Location: [place] CV LAB;  Service: Cardiovascular;  Laterality: N/A;   SHOULDER OPEN ROTATOR CUFF REPAIR Left    SKIN CANCER EXCISION Right    "cheek; real deep"   TEE WITHOUT CARDIOVERSION N/A 03/14/2015   Procedure: TRANSESOPHAGEAL ECHOCARDIOGRAM (TEE);  Surgeon: [name], MD;  Location: [place];  Service: Open Heart Surgery;  Laterality: N/A;   TEE WITHOUT CARDIOVERSION N/A 12/19/2015   Procedure: TRANSESOPHAGEAL ECHOCARDIOGRAM (TEE);  Surgeon: [name], MD;  Location: [place];  Service: Cardiovascular;  Laterality: N/A;   TEE WITHOUT CARDIOVERSION N/A 03/24/2016   Procedure: TRANSESOPHAGEAL ECHOCARDIOGRAM (TEE);  Surgeon: [name], MD;   Location: [place];  Service: Cardiovascular;  Laterality: N/A;   TONSILLECTOMY     TOTAL KNEE ARTHROPLASTY Right     reports that he has quit smoking. His smoking use included cigarettes. He has a 52.00 pack-year smoking history. He has never used smokeless tobacco. He reports that he does not drink alcohol and does not use drugs. family history includes Diabetes in his brother; Esophageal cancer in his father. Allergies  Allergen Reactions   Amiodarone Hcl Other (See Comments)    Patient reports photosensitivity   Ace Inhibitors Cough   Current Outpatient Medications on File Prior to Visit  Medication Sig Dispense Refill   atorvastatin (LIPITOR) 10 MG tablet TAKE 1 TABLET DAILY 90 tablet 3   b complex vitamins tablet Take 1 tablet by mouth daily.     ELIQUIS 5 MG TABS tablet TAKE 1 TABLET TWICE A DAY 180 tablet 1   HYDROcodone-acetaminophen (NORCO) 10-325 MG tablet Take 1 tablet by mouth daily as needed for severe  pain. 90 tablet 0   losartan (COZAAR) 50 MG tablet Take 1 tablet by mouth once daily 90 tablet 3   Multiple Vitamin (MULTIVITAMIN) tablet Take 1 tablet by mouth daily.     omega-3 acid ethyl esters (LOVAZA) 1 g capsule Take by mouth.     pantoprazole (PROTONIX) 40 MG tablet Take 1 tablet (40 mg total) by mouth daily. 90 tablet 3   Psyllium (METAMUCIL PO) Take 15 mLs by mouth 3 (three) times daily as needed (constipation).      No current facility-administered medications on file prior to visit.        ROS:  All others reviewed and negative.  Objective        PE:  BP 132/84   Pulse 90   Temp 97.6 F (36.4 C) (Oral)   Ht '5\' 10"'$  (1.778 m)   Wt 174 lb 2 oz (79 kg)   SpO2 96%   BMI 24.98 kg/m                 Constitutional: Pt appears in NAD               HENT: Head: NCAT.                Right Ear: External ear normal.                 Left Ear: External ear normal. Bilateral wax impactions resolved with irrigation and hearing improved               Eyes: .  Pupils are equal, round, and reactive to light. Conjunctivae and EOM are normal               Nose: without d/c or deformity               Neck: Neck supple. Gross normal ROM               Cardiovascular: Normal rate and regular rhythm.                 Pulmonary/Chest: Effort normal and breath sounds without rales or wheezing.                Abd:  Soft, NT, ND, + BS, no organomegaly               Neurological: Pt is alert. At baseline orientation, motor grossly intact               Skin: Skin is warm. No rashes, no other new lesions, LE edema - trace bilateal               Psychiatric: Pt behavior is normal without agitation   Micro: none  Cardiac tracings I have personally interpreted today:  none  Pertinent Radiological findings (summarize): none   Lab Results  Component Value Date   WBC 7.0 08/29/2021   HGB 14.3 08/29/2021   HCT 42.3 08/29/2021   PLT 204.0 08/29/2021   GLUCOSE 103 (H) 08/29/2021   CHOL 153 08/29/2021   TRIG 63.0 08/29/2021   HDL 56.10 08/29/2021   LDLDIRECT 159.2 09/29/2006   LDLCALC 84 08/29/2021   ALT 12 08/29/2021   AST 17 08/29/2021   NA 134 (L) 08/29/2021   K 4.7 08/29/2021   CL 99 08/29/2021   CREATININE 0.87 08/29/2021   BUN 13 08/29/2021   CO2 29 08/29/2021   TSH 0.77 08/29/2021   PSA 3.75 06/12/2016   INR 3.1 (A)  06/01/2018   HGBA1C 5.9 08/29/2021   Assessment/Plan:  Jonathan Orozco is a 88 y.o. White or Caucasian [1] male with  has a past medical history of ABUSE, ALCOHOL, IN REMISSION (04/08/2007), ALLERGIC RHINITIS (09/29/2006), Arthritis, Atrial fibrillation ([place]), Atrial fibrillation with RVR ([place]) (07/02/2016), Atrial flutter ([place]) (03/28/2010), BENIGN PROSTATIC HYPERTROPHY (09/29/2006), BPPV (benign paroxysmal positional vertigo) (04/08/2007), CAD (coronary artery disease), Carotid stenosis, Chronic lower back pain, COLONIC POLYPS, HX OF (06/23/2007), DISORDERS, ORGANIC INSOMNIA NOS (09/29/2006), Diverticulosis, ERECTILE DYSFUNCTION (09/29/2006), GERD  (gastroesophageal reflux disease), GLUCOSE INTOLERANCE (03/19/2010), HYPERLIPIDEMIA (09/29/2006), HYPERTENSION (09/29/2006), Long term (current) use of anticoagulants (04/26/2010), MELANOMA, MALIGNANT, SKIN NOS (09/29/2006), OSTEOARTHROSIS NOS, OTHER SPEC SITE (09/29/2006), Other specified forms of hearing loss (08/10/2009), Pneumonia (~ 1969), Presence of permanent cardiac pacemaker (06/02/2016), and VENTRICULAR TACHYCARDIA (09/29/2006).  Encounter for well adult exam with abnormal findings Age and sex appropriate education and counseling updated with regular exercise and diet Referrals for preventative services - none needed Immunizations addressed - decliens shingrix Smoking counseling  - none needed Evidence for depression or other mood disorder - none significant Most recent labs reviewed. I have personally reviewed and have noted: 1) the patient's medical and social history 2) The patient's current medications and supplements 3) The patient's height, weight, and BMI have been recorded in the chart   Hyperlipidemia Lab Results  Component Value Date   LDLCALC 84 08/29/2021   Stable, pt to continue current statin lipitor 10 mg qd   Hyperglycemia Lab Results  Component Value Date   HGBA1C 5.9 08/29/2021   Stable, pt to continue current medical treatment  - diet, wt control, excercise   Bilateral impacted cerumen Bilateral wax impactions resolved with irrigation and hearing improved  Ceruminosis is noted.  Wax is removed by syringing and manual debridement. Instructions for home care to prevent wax buildup are given.   Gait disorder For rollater asd,  to f/u any worsening symptoms or concerns  Benign prostatic hyperplasia with urinary frequency Worsening, for flomax .4 qd, refer urology  IBS (irritable bowel syndrome) With constipation - pt encouraged to change the dulcolox to otc daily miralax asd  Followup: Return in about 6 months (around 03/01/2022).  [name], MD  08/31/2021 3:37 PM [facility]

## 2021-08-29 NOTE — Patient Instructions (Addendum)
Please have your Shingrix (shingles) shots done at your local pharmacy.  You are given the Roll[place] prescription today  Your ears were cleared of wax today  Please take all new medication as prescribed - the prostate medication (generic flomax) at 2 times per day  Ok to use the miralax instead of the dulcolox for constipation (or OTC glycerin suppository)  Please continue all other medications as before, and refills have been done if requested.  Please have the pharmacy call with any other refills you may need.  Please continue your efforts at being more active, low cholesterol diet, and weight control.  You are otherwise up to date with prevention measures today.  Please keep your appointments with your specialists as you may have planned  You will be contacted regarding the referral for: urology  Please go to the LAB at the blood drawing area for the tests to be done  You will be contacted by phone if any changes need to be made immediately.  Otherwise, you will receive a letter about your results with an explanation, but please check with MyChart first.  Please remember to sign up for MyChart if you have not done so, as this will be important to you in the future with finding out test results, communicating by private email, and scheduling acute appointments online when needed.  Please make an Appointment to return in 6 months, or sooner if needed

## 2021-08-29 NOTE — Telephone Encounter (Signed)
Spoke with pt's wife, DPR and advised Handicap parking placard form has been completed and placed up fron for pick up.  Pt's wife verbalizes understanding and agrees with current plan.

## 2021-08-29 NOTE — Progress Notes (Signed)
PRE-PROCEDURE EXAM: Bilateral TMs cannot be visualized due to total occlusion/impaction of the ear canal.  PROCEDURE INDICATION: remove wax to visualize ear drum & relieve discomfort  CONSENT:  Verbal     PROCEDURE NOTE:     BILATERAL EAR:  I used a plastic wax curette under direct vision with an otoscope to free the wax bolus from the ear wall and then successfully removed a small bit of wax from the left ear, but not the right. Both ears were then irrigated with warm water to remove wax. Wax in right ear was not all removed due to patient not tolerating the right ear irrigation well.  POST- PROCEDURE EXAM: TM successfully visualized in left ear and found to have no erythema. Liquid softener placed in patient's right ear to loosen up the remaining wax. Advised patient to contact the office if any questions or concerns.

## 2021-08-31 ENCOUNTER — Encounter: Payer: Self-pay | Admitting: Internal Medicine

## 2021-08-31 DIAGNOSIS — N401 Enlarged prostate with lower urinary tract symptoms: Secondary | ICD-10-CM | POA: Insufficient documentation

## 2021-08-31 NOTE — Assessment & Plan Note (Signed)
Lab Results  Component Value Date   LDLCALC 84 08/29/2021   Stable, pt to continue current statin lipitor 10 mg qd

## 2021-08-31 NOTE — Assessment & Plan Note (Signed)
With constipation - pt encouraged to change the dulcolox to otc daily miralax asd

## 2021-08-31 NOTE — Assessment & Plan Note (Signed)
Worsening, for flomax .4 qd, refer urology

## 2021-08-31 NOTE — Assessment & Plan Note (Signed)
Lab Results  Component Value Date   HGBA1C 5.9 08/29/2021   Stable, pt to continue current medical treatment  - diet, wt control, excercise  

## 2021-08-31 NOTE — Assessment & Plan Note (Signed)
Bilateral wax impactions resolved with irrigation and hearing improved  Ceruminosis is noted.  Wax is removed by syringing and manual debridement. Instructions for home care to prevent wax buildup are given.

## 2021-08-31 NOTE — Assessment & Plan Note (Signed)
For rollater asd,  to f/u any worsening symptoms or concerns

## 2021-08-31 NOTE — Assessment & Plan Note (Signed)
Age and sex appropriate education and counseling updated with regular exercise and diet Referrals for preventative services - none needed Immunizations addressed - decliens shingrix Smoking counseling  - none needed Evidence for depression or other mood disorder - none significant Most recent labs reviewed. I have personally reviewed and have noted: 1) the patient's medical and social history 2) The patient's current medications and supplements 3) The patient's height, weight, and BMI have been recorded in the chart

## 2021-09-16 NOTE — Progress Notes (Signed)
Remote pacemaker transmission.   

## 2021-09-27 ENCOUNTER — Other Ambulatory Visit: Payer: Self-pay | Admitting: Internal Medicine

## 2021-09-27 MED ORDER — HYDROCODONE-ACETAMINOPHEN 10-325 MG PO TABS: 1.0000 | ORAL_TABLET | Freq: Every day | ORAL | 0 refills | Status: DC | PRN

## 2021-10-18 ENCOUNTER — Telehealth: Payer: Self-pay | Admitting: Internal Medicine

## 2021-10-18 NOTE — Telephone Encounter (Signed)
Please refill as per office routine med refill policy (all routine meds to be refilled for 3 mo or monthly (per pt preference) up to one year from last visit, then month to month grace period for 3 mo, then further med refills will have to be denied) ? ?

## 2021-10-25 NOTE — Telephone Encounter (Signed)
Please refill this per patient request.  Patient states that they still have not received this medication

## 2021-10-28 ENCOUNTER — Other Ambulatory Visit: Payer: Self-pay | Admitting: Internal Medicine

## 2021-10-28 MED ORDER — HYDROCODONE-ACETAMINOPHEN 10-325 MG PO TABS: 1.0000 | ORAL_TABLET | Freq: Every day | ORAL | 0 refills | Status: DC | PRN

## 2021-10-30 ENCOUNTER — Telehealth: Payer: Self-pay | Admitting: Internal Medicine

## 2021-10-30 MED ORDER — TAMSULOSIN HCL 0.4 MG PO CAPS: 0.4000 mg | ORAL_CAPSULE | Freq: Two times a day (BID) | ORAL | 3 refills | Status: DC

## 2021-10-30 NOTE — Telephone Encounter (Signed)
Patient is requesting his flomax be sent to [facility]

## 2021-10-30 NOTE — Addendum Note (Signed)
Addended by: Max Sane on: 10/30/2021 10:50 AM   Modules accepted: Orders

## 2021-11-04 ENCOUNTER — Ambulatory Visit (INDEPENDENT_AMBULATORY_CARE_PROVIDER_SITE_OTHER): Payer: Medicare Other | Admitting: Nurse Practitioner

## 2021-11-04 ENCOUNTER — Encounter: Payer: Self-pay | Admitting: Nurse Practitioner

## 2021-11-04 VITALS — BP 120/72 | HR 76 | Ht 70.0 in | Wt 175.0 lb

## 2021-11-04 DIAGNOSIS — K5909 Other constipation: Secondary | ICD-10-CM | POA: Diagnosis not present

## 2021-11-04 NOTE — Patient Instructions (Addendum)
Your water intake is inadequate. Need to increase water intake to 60 oz of water daily  Toilet hygiene reviewed ( elevated legs on stool, sit upright, relax, etc).  Add two colace stools softeners at bedtime.  Add glycerin suppositories. Insert in rectum as needed to facilitate evacuation of stool   We have scheduled your follow up with Jonathan Orozco for 12/16/21 at 10 am.  It was a pleasure to see you today!  Thank you for trusting me with your gastrointestinal care!

## 2021-11-04 NOTE — Progress Notes (Signed)
Chief Complaint:  constipation   Assessment &  Plan   # 88 yo male with chronic ( years) of altered bowel habits, predominantly constipation with incomplete evacuation.  water intake is inadequate. Need to increase water intake to 60 oz of water daily  Toilet hygiene discussed ( elevate legs on stool, sit upright, relax, etc).  Add two colace stools softeners at bedtime.  Continue BID Metamucil Glycerin suppositories. Insert into rectum as needed to facilitate evacuation of stool  Follow up with me in 6 weeks   # History of diminutive adenomas.  Last colonoscopy 2016.  Diverticulosis as well.  Aged out of surveillance  # Atrial fibrillation, on Eliquis   HPI   Jonathan Orozco is a 88 y.o. male known to Dr.  Henrene Pastor with multiple medical problems not limited to A-fib/a flutter , permanent pacemaker CAD , carotid stenosis , hypertension , HLD, GERD, colon polyps. See PMH /PSH for additional history   Jonathan Orozco was last seen in April 2021 for irregular bowel habits which were improving with TID metamucil.   Interval History:  Patient is HOH, didn't bring hearing aids. Wife helps with history.  He is here for evaluation chronic constipation. He describes incomplete evacuation. He gets urge to have a BM about every other day but is not able to evacuate rectum.  He takes takes Metamucil twice daily. He drinks only about 24 oz of water a day. He has tried stool softeners but not on a consistent basis. He hasn't had any blood in his stools. No abdominal pain. His weight is stable.   Liver chemistries, renal function, CBC  late July 2023 were normal.   Previous GI Evaluation   Last complete colonoscopy by Dr. [name] November 2016 revealed diverticulosis, incidental lipoma, and diminutive adenomas    Labs:     Latest Ref Rng & Units 08/29/2021    2:26 PM 12/10/2020   12:13 PM 07/12/2020    2:18 PM  CBC  WBC 4.0 - 10.5 K/uL 7.0  7.4  6.7   Hemoglobin 13.0 - 17.0 g/dL 14.3  13.6   13.6   Hematocrit 39.0 - 52.0 % 42.3  39.2  40.6   Platelets 150.0 - 400.0 K/uL 204.0  249  219.0        Latest Ref Rng & Units 08/29/2021    2:26 PM 07/12/2020    2:18 PM 01/11/2020   12:43 PM  Hepatic Function  Total Protein 6.0 - 8.3 g/dL 7.5  7.0  7.3   Albumin 3.5 - 5.2 g/dL 4.2  4.1  4.2   AST 0 - 37 U/L _0 ALT 0 - 53 U/L _1 Alk Phosphatase 39 - 117 U/L 95  97  101   Total Bilirubin 0.2 - 1.2 mg/dL 1.2  1.1  1.3   Bilirubin, Direct 0.0 - 0.3 mg/dL 0.2  0.3  0.3      Past Medical History:  Diagnosis Date   ABUSE, ALCOHOL, IN REMISSION 04/08/2007   ALLERGIC RHINITIS 09/29/2006   Arthritis    "hands" (07/02/2016)   Atrial fibrillation ([place])    Atrial fibrillation with RVR ([place]) 07/02/2016   Atrial flutter ([place]) 03/28/2010   a. s/p prior rfca;  b. recurrent paroxysmal flutter 04/2012;  c. pradaxa initiated 04/2012.   BENIGN PROSTATIC HYPERTROPHY 09/29/2006   BPPV (benign paroxysmal positional vertigo) 04/08/2007   CAD (coronary artery disease)  a. LHC 2/17: EF 55-65%, LM 75, pLAD 75, oD1 75, oD2 65, D3 85, oLCx 99, oOM1 75, pRCA 25 >> CABG   Carotid stenosis    a. 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   Diverticulosis    ERECTILE DYSFUNCTION 09/29/2006   GERD (gastroesophageal reflux disease)    GLUCOSE INTOLERANCE 03/19/2010   HYPERLIPIDEMIA 09/29/2006   HYPERTENSION 09/29/2006   Long term (current) use of anticoagulants 04/26/2010   MELANOMA, MALIGNANT, SKIN NOS 09/29/2006   other skin cancers -no further melanoma   OSTEOARTHROSIS NOS, OTHER [facility] SITE 09/29/2006   Other specified forms of hearing loss 08/10/2009   Pneumonia ~ 1969   Presence of permanent cardiac pacemaker 06/02/2016   VENTRICULAR TACHYCARDIA 09/29/2006    Past Surgical History:  Procedure Laterality Date   AV NODE ABLATION  07/02/2016   AV NODE ABLATION N/A 07/02/2016   Procedure:  AV Node Ablation;  Surgeon: Deboraha Sprang, MD;  Location: [place] CV LAB;  Service: Cardiovascular;  Laterality: N/A;   CARDIAC CATHETERIZATION N/A 03/12/2015   Procedure: Left Heart Cath and Coronary Angiography;  Surgeon: [name], MD;  Location: [place] CV LAB;  Service: Cardiovascular;  Laterality: N/A;   CARDIOVERSION N/A 12/19/2015   Procedure: CARDIOVERSION;  Surgeon: [name], MD;  Location: [facility] ENDOSCOPY;  Service: Cardiovascular;  Laterality: N/A;   CARDIOVERSION N/A 03/24/2016   Procedure: CARDIOVERSION;  Surgeon: [name], MD;  Location: [place];  Service: Cardiovascular;  Laterality: N/A;   CATARACT EXTRACTION W/ INTRAOCULAR LENS  IMPLANT, BILATERAL Bilateral    CLIPPING OF ATRIAL APPENDAGE N/A 03/14/2015   Procedure: CLIPPING OF ATRIAL APPENDAGE;  Surgeon: [name], MD;  Location: [place];  Service: Open Heart Surgery;  Laterality: N/A;   COLONOSCOPY WITH PROPOFOL N/A 12/08/2014   Procedure: COLONOSCOPY WITH PROPOFOL;  Surgeon: [name], MD;  Location: [facility] ENDOSCOPY;  Service: Endoscopy;  Laterality: N/A;   CORONARY ARTERY BYPASS GRAFT N/A 03/14/2015   Procedure: CORONARY ARTERY BYPASS GRAFTING (CABG) x 4 (LIMA to LAD, SVG to DIAGONAL 2, SVG SEQUENTIALLY to OM1 and OM2);  Surgeon: [name], MD;  Location: [place];  Service: Open Heart Surgery;  Laterality: N/A;   INCISION AND DRAINAGE N/A 07/03/2016   Procedure: INCISION AND DRAINAGE of CHEST ABSCESS;  Surgeon: [name], MD;  Location: [place];  Service: Thoracic;  Laterality: N/A;   INSERT / REPLACE / REMOVE PACEMAKER  06/02/2016   JOINT REPLACEMENT     KNEE ARTHROSCOPY Left    MELANOMA EXCISION     "upper back"   PACEMAKER IMPLANT N/A 06/02/2016   Procedure: Pacemaker Implant;  Surgeon: Deboraha Sprang, MD;  Location: [place] CV LAB;  Service: Cardiovascular;  Laterality: N/A;   SHOULDER OPEN ROTATOR CUFF REPAIR Left    SKIN CANCER EXCISION Right    "cheek; real  deep"   TEE WITHOUT CARDIOVERSION N/A 03/14/2015   Procedure: TRANSESOPHAGEAL ECHOCARDIOGRAM (TEE);  Surgeon: [name], MD;  Location: [place];  Service: Open Heart Surgery;  Laterality: N/A;   TEE WITHOUT CARDIOVERSION N/A 12/19/2015   Procedure: TRANSESOPHAGEAL ECHOCARDIOGRAM (TEE);  Surgeon: [name], MD;  Location: [place];  Service: Cardiovascular;  Laterality: N/A;   TEE WITHOUT CARDIOVERSION N/A 03/24/2016   Procedure: TRANSESOPHAGEAL ECHOCARDIOGRAM (TEE);  Surgeon: [name], MD;  Location: [place];  Service: Cardiovascular;  Laterality: N/A;   TONSILLECTOMY     TOTAL KNEE ARTHROPLASTY Right     Current Medications, Allergies, Family History and Social History were reviewed in [employer] record.     Current Outpatient Medications  Medication Sig Dispense Refill   b complex vitamins tablet Take 1 tablet by mouth daily.     ELIQUIS 5 MG TABS tablet TAKE 1 TABLET TWICE A DAY 180 tablet 1   HYDROcodone-acetaminophen (NORCO) 10-325 MG tablet Take 1 tablet by mouth daily as needed for severe pain. 90 tablet 0   Multiple Vitamin (MULTIVITAMIN) tablet Take 1 tablet by mouth daily.     omega-3 acid ethyl esters (LOVAZA) 1 g capsule Take by mouth.     pantoprazole (PROTONIX) 40 MG tablet Take 1 tablet (40 mg total) by mouth daily. 90 tablet 3   Psyllium (METAMUCIL PO) Take 15 mLs by mouth 3 (three) times daily as needed (constipation).      tamsulosin (FLOMAX) 0.4 MG CAPS capsule Take 1 capsule (0.4 mg total) by mouth in the morning and at bedtime. 180 capsule 3   atorvastatin (LIPITOR) 10 MG tablet TAKE 1 TABLET DAILY (Patient not taking: Reported on 11/04/2021) 90 tablet 3   losartan (COZAAR) 50 MG tablet Take 1 tablet by mouth once daily (Patient not taking: Reported on 11/04/2021) 90 tablet 3   No current facility-administered medications for this visit.    Review of Systems: No chest pain. No shortness of breath. No urinary  complaints.    Physical Exam  Wt Readings from Last 3 Encounters:  11/04/21 175 lb (79.4 kg)  08/29/21 174 lb 2 oz (79 kg)  12/10/20 172 lb (78 kg)    BP 120/72   Pulse 76   Ht _0  (1.778 m)   Wt 175 lb (79.4 kg)   SpO2 98%   BMI 25.11 kg/m  Constitutional:  Generally well appearing male in no acute distress. Psychiatric: Pleasant. Normal mood and affect. Behavior is normal. EENT: Pupils normal.  Conjunctivae are normal. No scleral icterus. Neck supple.  Cardiovascular: Normal rate, regular rhythm. No edema Pulmonary/chest: Effort normal and breath sounds normal. No wheezing, rales or rhonchi. Abdominal: Soft, nondistended, nontender. Bowel sounds active throughout. There are no masses palpable. No hepatomegaly. Neurological: Alert and oriented to person place and time. Skin: Skin is warm and dry. No rashes noted.  I spent 30 minutes total reviewing records, obtaining history, performing exam, counseling patient and documenting visit / findings.   Tye Savoy, NP  11/04/2021, 10:25 AM

## 2021-11-04 NOTE — Progress Notes (Signed)
Assessment and plan noted ?

## 2021-11-18 ENCOUNTER — Ambulatory Visit (INDEPENDENT_AMBULATORY_CARE_PROVIDER_SITE_OTHER): Payer: Medicare Other

## 2021-11-18 DIAGNOSIS — I480 Paroxysmal atrial fibrillation: Secondary | ICD-10-CM

## 2021-11-19 ENCOUNTER — Ambulatory Visit (INDEPENDENT_AMBULATORY_CARE_PROVIDER_SITE_OTHER): Payer: Medicare Other

## 2021-11-19 VITALS — Ht 70.0 in

## 2021-11-19 DIAGNOSIS — Z0000 Encounter for general adult medical examination without abnormal findings: Secondary | ICD-10-CM | POA: Diagnosis not present

## 2021-11-19 LAB — CUP PACEART REMOTE DEVICE CHECK
Battery Remaining Longevity: 88 mo
Battery Voltage: 2.98 V
Brady Statistic RV Percent Paced: 99.78 %
Date Time Interrogation Session: 20231015223739
Implantable Lead Implant Date: 20180430
Implantable Lead Location: 753860
Implantable Lead Model: 5076
Implantable Pulse Generator Implant Date: 20180430
Lead Channel Impedance Value: 380 Ohm
Lead Channel Impedance Value: 589 Ohm
Lead Channel Pacing Threshold Amplitude: 0.625 V
Lead Channel Pacing Threshold Pulse Width: 0.4 ms
Lead Channel Sensing Intrinsic Amplitude: 14.875 mV
Lead Channel Sensing Intrinsic Amplitude: 14.875 mV
Lead Channel Setting Pacing Amplitude: 2.5 V
Lead Channel Setting Pacing Pulse Width: 0.4 ms
Lead Channel Setting Sensing Sensitivity: 1.2 mV

## 2021-11-19 NOTE — Progress Notes (Signed)
Virtual Visit via Telephone Note  I connected with  Jonathan Orozco on 11/19/21 at  1:00 PM EDT by telephone and verified that I am speaking with the correct person using two identifiers.  Location: Patient and Wife: Home Provider: [place] Persons participating in the virtual visit: [place]   I discussed the limitations, risks, security and privacy concerns of performing an evaluation and management service by telephone and the availability of in person appointments. The patient expressed understanding and agreed to proceed.  Interactive audio and video telecommunications were attempted between this nurse and patient, however failed, due to patient having technical difficulties OR patient did not have access to video capability.  We continued and completed visit with audio only.  Some vital signs may be absent or patient reported.   Sheral Flow, LPN  Subjective:   Jonathan Orozco is a 88 y.o. male who presents for Medicare Annual/Subsequent preventive examination.  Review of Systems     Cardiac Risk Factors include: advanced age (>25mn, >>6women);dyslipidemia;hypertension;sedentary lifestyle;male gender     Objective:    Today's Vitals   11/19/21 1303  Height: '5\' 10"'$  (1.778 m)  PainSc: 0-No pain   Body mass index is 25.11 kg/m.     11/19/2021    1:09 PM 11/12/2020   12:12 PM 12/08/2019    1:31 PM 07/14/2019    1:15 PM 06/20/2019    6:38 AM 05/30/2019    3:35 PM 05/30/2019   11:34 AM  Advanced Directives  Does Patient Have a Medical Advance Directive? Yes Yes Yes Yes No No No  Type of AParamed[place]of [place] will  [place]Living will [place] will     Does patient want to make changes to medical advance directive?  No - Patient declined  No - Patient declined     Copy of [place] Chart? No - copy requested        Would patient like  information on creating a medical advance directive?     No - Patient declined  No - Patient declined    Current Medications (verified) Outpatient Encounter Medications as of 11/19/2021  Medication Sig   atorvastatin (LIPITOR) 10 MG tablet TAKE 1 TABLET DAILY (Patient not taking: Reported on 11/04/2021)   b complex vitamins tablet Take 1 tablet by mouth daily.   ELIQUIS 5 MG TABS tablet TAKE 1 TABLET TWICE A DAY   HYDROcodone-acetaminophen (NORCO) 10-325 MG tablet Take 1 tablet by mouth daily as needed for severe pain.   losartan (COZAAR) 50 MG tablet Take 1 tablet by mouth once daily (Patient not taking: Reported on 11/04/2021)   Multiple Vitamin (MULTIVITAMIN) tablet Take 1 tablet by mouth daily.   omega-3 acid ethyl esters (LOVAZA) 1 g capsule Take by mouth.   pantoprazole (PROTONIX) 40 MG tablet Take 1 tablet (40 mg total) by mouth daily.   Psyllium (METAMUCIL PO) Take 15 mLs by mouth 3 (three) times daily as needed (constipation).    tamsulosin (FLOMAX) 0.4 MG CAPS capsule Take 1 capsule (0.4 mg total) by mouth in the morning and at bedtime.   No facility-administered encounter medications on file as of 11/19/2021.    Allergies (verified) Amiodarone hcl and Ace inhibitors   History: Past Medical History:  Diagnosis Date   ABUSE, ALCOHOL, IN REMISSION 04/08/2007   ALLERGIC RHINITIS 09/29/2006   Arthritis    "hands" (07/02/2016)   Atrial fibrillation ([place]  Atrial fibrillation with RVR ([place]) 07/02/2016   Atrial flutter ([place]) 03/28/2010   a. s/p prior rfca;  b. recurrent paroxysmal flutter 04/2012;  c. pradaxa initiated 04/2012.   BENIGN PROSTATIC HYPERTROPHY 09/29/2006   BPPV (benign paroxysmal positional vertigo) 04/08/2007   CAD (coronary artery disease)    a. LHC 2/17: EF 55-65%, LM 75, pLAD 75, oD1 75, oD2 65, D3 85, oLCx 99, oOM1 75, pRCA 25 >> CABG   Carotid stenosis    a. 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   Diverticulosis    ERECTILE DYSFUNCTION 09/29/2006   GERD (gastroesophageal reflux disease)    GLUCOSE INTOLERANCE 03/19/2010   HYPERLIPIDEMIA 09/29/2006   HYPERTENSION 09/29/2006   Long term (current) use of anticoagulants 04/26/2010   MELANOMA, MALIGNANT, SKIN NOS 09/29/2006   other skin cancers -no further melanoma   OSTEOARTHROSIS NOS, OTHER [facility] SITE 09/29/2006   Other specified forms of hearing loss 08/10/2009   Pneumonia ~ 1969   Presence of permanent cardiac pacemaker 06/02/2016   VENTRICULAR TACHYCARDIA 09/29/2006   Past Surgical History:  Procedure Laterality Date   AV NODE ABLATION  07/02/2016   AV NODE ABLATION N/A 07/02/2016   Procedure: AV Node Ablation;  Surgeon: Deboraha Sprang, MD;  Location: [place] [facility];  Service: Cardiovascular;  Laterality: N/A;   CARDIAC CATHETERIZATION N/A 03/12/2015   Procedure: Left Heart Cath and Coronary Angiography;  Surgeon: [name], MD;  Location: [place] CV LAB;  Service: Cardiovascular;  Laterality: N/A;   CARDIOVERSION N/A 12/19/2015   Procedure: CARDIOVERSION;  Surgeon: [name], MD;  Location: [facility] ENDOSCOPY;  Service: Cardiovascular;  Laterality: N/A;   CARDIOVERSION N/A 03/24/2016   Procedure: CARDIOVERSION;  Surgeon: [name], MD;  Location: [place];  Service: Cardiovascular;  Laterality: N/A;   CATARACT EXTRACTION W/ INTRAOCULAR LENS  IMPLANT, BILATERAL Bilateral    CLIPPING OF ATRIAL APPENDAGE N/A 03/14/2015   Procedure: CLIPPING OF ATRIAL APPENDAGE;  Surgeon: [name], MD;  Location: [place];  Service: Open Heart Surgery;  Laterality: N/A;   COLONOSCOPY WITH PROPOFOL N/A 12/08/2014   Procedure: COLONOSCOPY WITH PROPOFOL;  Surgeon: [name], MD;  Location: [facility] ENDOSCOPY;  Service: Endoscopy;  Laterality: N/A;   CORONARY ARTERY BYPASS GRAFT N/A 03/14/2015   Procedure: CORONARY ARTERY BYPASS GRAFTING (CABG) x 4 (LIMA to LAD, SVG to DIAGONAL 2, SVG SEQUENTIALLY  to OM1 and OM2);  Surgeon: [name], MD;  Location: [place];  Service: Open Heart Surgery;  Laterality: N/A;   INCISION AND DRAINAGE N/A 07/03/2016   Procedure: INCISION AND DRAINAGE of CHEST ABSCESS;  Surgeon: [name], MD;  Location: [place];  Service: Thoracic;  Laterality: N/A;   INSERT / REPLACE / REMOVE PACEMAKER  06/02/2016   JOINT REPLACEMENT     KNEE ARTHROSCOPY Left    MELANOMA EXCISION     "upper back"   PACEMAKER IMPLANT N/A 06/02/2016   Procedure: Pacemaker Implant;  Surgeon: Deboraha Sprang, MD;  Location: [place] CV LAB;  Service: Cardiovascular;  Laterality: N/A;   SHOULDER OPEN ROTATOR CUFF REPAIR Left    SKIN CANCER EXCISION Right    "cheek; real deep"   TEE WITHOUT CARDIOVERSION N/A 03/14/2015   Procedure: TRANSESOPHAGEAL ECHOCARDIOGRAM (TEE);  Surgeon: [name], MD;  Location: [place];  Service: Open Heart Surgery;  Laterality: N/A;  TEE WITHOUT CARDIOVERSION N/A 12/19/2015   Procedure: TRANSESOPHAGEAL ECHOCARDIOGRAM (TEE);  Surgeon: [name], MD;  Location: [place];  Service: Cardiovascular;  Laterality: N/A;   TEE WITHOUT CARDIOVERSION N/A 03/24/2016   Procedure: TRANSESOPHAGEAL ECHOCARDIOGRAM (TEE);  Surgeon: [name], MD;  Location[facility] ENDOSCOPY;  Service: Cardiovascular;  Laterality: N/A;   TONSILLECTOMY     TOTAL KNEE ARTHROPLASTY Right    Family History  Problem Relation Age of Onset   Diabetes Brother    Esophageal cancer Father    Colon cancer Neg Hx    Stomach cancer Neg Hx    Social History   Socioeconomic History   Marital status: Married    Spouse name: Not on file   Number of children: Not on file   Years of education: Not on file   Highest education level: Not on file  Occupational History   Occupation: Retired, Press photographer  Tobacco Use   Smoking status: Former    Packs/day: 1.00    Years: 52.00    Total pack years: 52.00    Types: Cigarettes   Smokeless tobacco: Never   Tobacco comments:     07/02/2016 "quit before 2000"  Vaping Use   Vaping Use: Never used  Substance and Sexual Activity   Alcohol use: No    Comment: 07/02/2016 "quit before 2000"   Drug use: No   Sexual activity: Never  Other Topics Concern   Not on file  Social History Narrative   Married, 3 children. Retired Press photographer. Lives in [place] with  wife and kids. He is retired from Press photographer.    Social Determinants of Health   Financial Resource Strain: Low Risk  (11/19/2021)   Overall Financial Resource Strain (CARDIA)    Difficulty of Paying Living Expenses: Not hard at all  Food Insecurity: No Food Insecurity (11/19/2021)   Hunger Vital Sign    Worried About Running Out of Food in the Last Year: Never true    Ran Out of Food in the Last Year: Never true  Transportation Needs: No Transportation Needs (11/19/2021)   PRAPARE - Hydrologist (Medical): No    Lack of Transportation (Non-Medical): No  Physical Activity: Inactive (11/19/2021)   Exercise Vital Sign    Days of Exercise per Week: 0 days    Minutes of Exercise per Session: 0 min  Stress: No Stress Concern Present (11/19/2021)   [place]    Feeling of Stress : Not at all  Social Connections: [place] (11/19/2021)   Social Connection and Isolation Panel [NHANES]    Frequency of Communication with Friends and Family: More than three times a week    Frequency of Social Gatherings with Friends and Family: More than three times a week    Attends Religious Services: More than 4 times per year    Active Member of [employer] or Organizations: Yes    Attends Music therapist: More than 4 times per year    Marital Status: Married    Tobacco Counseling Counseling given: Not Answered Tobacco comments: 07/02/2016 "quit before 2000"   Clinical Intake:  Pre-visit preparation completed: Yes  Pain : No/denies pain Pain Score: 0-No pain      Nutritional Risks: None Diabetes: No  How often do you need to have someone help you when you read instructions, pamphlets, or other written materials from your doctor or pharmacy?: 1 - Never What is the last grade level  you completed in school?: HSG; BACHELOR'S DEGREE  Diabetic? no  Interpreter Needed?: No  Information entered by :: Lisette Abu, LPN.   Activities of Daily Living    11/19/2021    1:18 PM  In your present state of health, do you have any difficulty performing the following activities:  Hearing? 1  Vision? 0  Difficulty concentrating or making decisions? 1  Walking or climbing stairs? 0  Dressing or bathing? 0  Doing errands, shopping? 1  Preparing Food and eating ? N  Using the Toilet? N  In the past six months, have you accidently leaked urine? N  Do you have problems with loss of bowel control? N  Managing your Medications? N  Managing your Finances? N  Housekeeping or managing your Housekeeping? N    Patient Care Team: Biagio Borg, MD as PCP - General (Internal Medicine) Jettie Booze, MD as PCP - Cardiology (Cardiology) Deboraha Sprang, MD as PCP - Electrophysiology (Cardiology) Luberta Mutter, MD as Consulting Physician (Ophthalmology) Allyn Kenner, MD as Consulting Physician (Dermatology) Izora Gala, MD as Consulting Physician (Otolaryngology)  Indicate any recent Medical Services you may have received from other than [facility] providers in the past year (date may be approximate).     Assessment:   This is a routine wellness examination for [place].  Hearing/Vision screen Hearing Screening - Comments:: Patient wears hearing aids. Vision Screening - Comments:: Wears rx glasses - up to date with routine eye exams with Luberta Mutter, MD.   Dietary issues and exercise activities discussed: Current Exercise Habits: The patient does not participate in regular exercise at present, Exercise limited by: cardiac condition(s)   Goals  Addressed   None   Depression Screen    11/19/2021    1:13 PM 08/29/2021    1:37 PM 08/29/2021    1:07 PM 11/12/2020   12:08 PM 08/03/2020   11:11 AM 07/14/2019    1:17 PM 04/29/2019   10:38 AM  PHQ 2/9 Scores  PHQ - 2 Score 0 0 0 0 0 0 0    Fall Risk    11/19/2021    1:10 PM 08/29/2021    1:37 PM 08/29/2021    1:13 PM 08/29/2021    1:06 PM 11/12/2020   12:16 PM  [place] in the past year? 0 0 0  0  Number falls in past yr: 0 0 0 0 0  Injury with Fall? 0 0 0 0 0  Risk for fall due to : No Fall Risks  No Fall Risks No Fall Risks No Fall Risks  Follow up Falls prevention discussed  Falls evaluation completed Falls evaluation completed Falls evaluation completed    [place]:  Any stairs in or around the home? Yes  If so, are there any without handrails? No  Home free of loose throw rugs in walkways, pet beds, electrical cords, etc? Yes  Adequate lighting in your home to reduce risk of falls? Yes   ASSISTIVE DEVICES UTILIZED TO PREVENT FALLS:  Life alert? No  Use of a cane, walker or w/c? No  Grab bars in the bathroom? Yes  Shower chair or bench in shower? Yes  Elevated toilet seat or a handicapped toilet? Yes   TIMED UP AND GO:  Was the test performed? No . Phone Visit  Cognitive Function:        11/19/2021    1:23 PM  6CIT Screen  What Year? 0 points  What month? 0 points  What time? 0 points  Count back from 20 0 points  Months in reverse 0 points  Repeat phrase 0 points  Total Score 0 points    Immunizations Immunization History  Administered Date(s) Administered   Fluad Quad(high Dose 65+) 10/08/2018, 11/10/2019, 10/19/2020, 10/30/2021   H1N1 03/14/2008   Influenza Split 11/11/2010, 10/10/2011   Influenza Whole 01/20/2007, 11/02/2007, 03/16/2009, 11/16/2009   Influenza, High Dose Seasonal PF 10/21/2013, 11/20/2015, 11/25/2016, 11/17/2017   Influenza,inj,Quad PF,6+ Mos 11/09/2012   Influenza-Unspecified  11/24/2014   PFIZER Comirnaty(Gray Top)Covid-19 Tri-Sucrose Vaccine 08/21/2020, 10/30/2021   PFIZER(Purple Top)SARS-COV-2 Vaccination 03/17/2019, 04/11/2019, 11/22/2019   Pneumococcal Conjugate-13 05/24/2013   Pneumococcal Polysaccharide-23 03/31/2006   Td 03/31/2006   Tdap 06/19/2016    TDAP status: Up to date  Flu Vaccine status: Up to date  Pneumococcal vaccine status: Up to date  Covid-19 vaccine status: Completed vaccines  Qualifies for Shingles Vaccine? Yes   Zostavax completed No   Shingrix Completed?: No.    Education has been provided regarding the importance of this vaccine. Patient has been advised to call insurance company to determine out of pocket expense if they have not yet received this vaccine. Advised may also receive vaccine at local pharmacy or Health Dept. Verbalized acceptance and understanding.  Screening Tests Health Maintenance  Topic Date Due   Zoster Vaccines- Shingrix (1 of 2) 11/29/2021 (Originally 02/04/1947)   COVID-19 Vaccine (7 - Pfizer risk series) 12/25/2021   TETANUS/TDAP  06/20/2026   Pneumonia Vaccine 32+ Years old  Completed   INFLUENZA VACCINE  Completed   HPV VACCINES  Aged Out    Health Maintenance  There are no preventive care reminders to display for this patient.  Colorectal cancer screening: No longer required.   Lung Cancer Screening: (Low Dose CT Chest recommended if Age 63-80 years, 30 pack-year currently smoking OR have quit w/in 15years.) does not qualify.   Lung Cancer Screening Referral: no  Additional Screening:  Hepatitis C Screening: does not qualify; Completed no  Vision Screening: Recommended annual ophthalmology exams for early detection of glaucoma and other disorders of the eye. Is the patient up to date with their annual eye exam?  Yes  Who is the provider or what is the name of the office in which the patient attends annual eye exams? Luberta Mutter, MD. If pt is not established with a provider, would  they like to be referred to a provider to establish care? No .   Dental Screening: Recommended annual dental exams for proper oral hygiene  Community Resource Referral / Chronic Care Management: CRR required this visit?  No   CCM required this visit?  No      Plan:     I have personally reviewed and noted the following in the patient's chart:   Medical and social history Use of alcohol, tobacco or illicit drugs  Current medications and supplements including opioid prescriptions. Patient is currently taking opioid prescriptions. Information provided to patient regarding non-opioid alternatives. Patient advised to discuss non-opioid treatment plan with their provider. Functional ability and status Nutritional status Physical activity Advanced directives List of other physicians Hospitalizations, surgeries, and ER visits in previous 12 months Vitals Screenings to include cognitive, depression, and falls Referrals and appointments  In addition, I have reviewed and discussed with patient certain preventive protocols, quality metrics, and best practice recommendations. A written personalized care plan for preventive services as well as general preventive health recommendations were provided to patient.  Sheral Flow, LPN   [id number]   Nurse Notes: N/A

## 2021-11-19 NOTE — Patient Instructions (Signed)
Jonathan Orozco , Thank you for taking time to come for your Medicare Wellness Visit. I appreciate your ongoing commitment to your health goals. Please review the following plan we discussed and let me know if I can assist you in the future.   These are the goals we discussed:  Goals       Client understands the importance of follow-up with providers by attending scheduled visits (pt-stated)      To see next year.        This is a list of the screening recommended for you and due dates:  Health Maintenance  Topic Date Due   Zoster (Shingles) Vaccine (1 of 2) 11/29/2021*   COVID-19 Vaccine (7 - Pfizer risk series) 12/25/2021   Tetanus Vaccine  06/20/2026   Pneumonia Vaccine  Completed   Flu Shot  Completed   HPV Vaccine  Aged Out  *Topic was postponed. The date shown is not the original due date.    Advanced directives: Yes  Conditions/risks identified: Yes  Next appointment: Follow up in one year for your annual wellness visit.   Preventive Care 63 Years and Older, Male  Preventive care refers to lifestyle choices and visits with your health care provider that can promote health and wellness. What does preventive care include? A yearly physical exam. This is also called an annual well check. Dental exams once or twice a year. Routine eye exams. Ask your health care provider how often you should have your eyes checked. Personal lifestyle choices, including: Daily care of your teeth and gums. Regular physical activity. Eating a healthy diet. Avoiding tobacco and drug use. Limiting alcohol use. Practicing safe sex. Taking low doses of aspirin every day. Taking vitamin and mineral supplements as recommended by your health care provider. What happens during an annual well check? The services and screenings done by your health care provider during your annual well check will depend on your age, overall health, lifestyle risk factors, and family history of disease. Counseling   Your health care provider may ask you questions about your: Alcohol use. Tobacco use. Drug use. Emotional well-being. Home and relationship well-being. Sexual activity. Eating habits. History of falls. Memory and ability to understand (cognition). Work and work Statistician. Screening  You may have the following tests or measurements: Height, weight, and BMI. Blood pressure. Lipid and cholesterol levels. These may be checked every 5 years, or more frequently if you are over 33 years old. Skin check. Lung cancer screening. You may have this screening every year starting at age 30 if you have a 30-pack-year history of smoking and currently smoke or have quit within the past 15 years. Fecal occult blood test (FOBT) of the stool. You may have this test every year starting at age 81. Flexible sigmoidoscopy or colonoscopy. You may have a sigmoidoscopy every 5 years or a colonoscopy every 10 years starting at age 39. Prostate cancer screening. Recommendations will vary depending on your family history and other risks. Hepatitis C blood test. Hepatitis B blood test. Sexually transmitted disease (STD) testing. Diabetes screening. This is done by checking your blood sugar (glucose) after you have not eaten for a while (fasting). You may have this done every 1-3 years. Abdominal aortic aneurysm (AAA) screening. You may need this if you are a current or former smoker. Osteoporosis. You may be screened starting at age 4 if you are at high risk. Talk with your health care provider about your test results, treatment options, and if necessary, the  need for more tests. Vaccines  Your health care provider may recommend certain vaccines, such as: Influenza vaccine. This is recommended every year. Tetanus, diphtheria, and acellular pertussis (Tdap, Td) vaccine. You may need a Td booster every 10 years. Zoster vaccine. You may need this after age 46. Pneumococcal 13-valent conjugate (PCV13) vaccine.  One dose is recommended after age 14. Pneumococcal polysaccharide (PPSV23) vaccine. One dose is recommended after age 24. Talk to your health care provider about which screenings and vaccines you need and how often you need them. This information is not intended to replace advice given to you by your health care provider. Make sure you discuss any questions you have with your health care provider. Document Released: 02/16/2015 Document Revised: 10/10/2015 Document Reviewed: 11/21/2014 Elsevier Interactive Patient Education  2017 [place] Prevention in the Home Falls can cause injuries. They can happen to people of all ages. There are many things you can do to make your home safe and to help prevent falls. What can I do on the outside of my home? Regularly fix the edges of walkways and driveways and fix any cracks. Remove anything that might make you trip as you walk through a door, such as a raised step or threshold. Trim any bushes or trees on the path to your home. Use bright outdoor lighting. Clear any walking paths of anything that might make someone trip, such as rocks or tools. Regularly check to see if handrails are loose or broken. Make sure that both sides of any steps have handrails. Any raised decks and porches should have guardrails on the edges. Have any leaves, snow, or ice cleared regularly. Use sand or salt on walking paths during winter. Clean up any spills in your garage right away. This includes oil or grease spills. What can I do in the bathroom? Use night lights. Install grab bars by the toilet and in the tub and shower. Do not use towel bars as grab bars. Use non-skid mats or decals in the tub or shower. If you need to sit down in the shower, use a plastic, non-slip stool. Keep the floor dry. Clean up any water that spills on the floor as soon as it happens. Remove soap buildup in the tub or shower regularly. Attach bath mats securely with double-sided  non-slip rug tape. Do not have throw rugs and other things on the floor that can make you trip. What can I do in the bedroom? Use night lights. Make sure that you have a light by your bed that is easy to reach. Do not use any sheets or blankets that are too big for your bed. They should not hang down onto the floor. Have a firm chair that has side arms. You can use this for support while you get dressed. Do not have throw rugs and other things on the floor that can make you trip. What can I do in the kitchen? Clean up any spills right away. Avoid walking on wet floors. Keep items that you use a lot in easy-to-reach places. If you need to reach something above you, use a strong step stool that has a grab bar. Keep electrical cords out of the way. Do not use floor polish or wax that makes floors slippery. If you must use wax, use non-skid floor wax. Do not have throw rugs and other things on the floor that can make you trip. What can I do with my stairs? Do not leave any items on the  stairs. Make sure that there are handrails on both sides of the stairs and use them. Fix handrails that are broken or loose. Make sure that handrails are as long as the stairways. Check any carpeting to make sure that it is firmly attached to the stairs. Fix any carpet that is loose or worn. Avoid having throw rugs at the top or bottom of the stairs. If you do have throw rugs, attach them to the floor with carpet tape. Make sure that you have a light switch at the top of the stairs and the bottom of the stairs. If you do not have them, ask someone to add them for you. What else can I do to help prevent falls? Wear shoes that: Do not have high heels. Have rubber bottoms. Are comfortable and fit you well. Are closed at the toe. Do not wear sandals. If you use a stepladder: Make sure that it is fully opened. Do not climb a closed stepladder. Make sure that both sides of the stepladder are locked into place. Ask  someone to hold it for you, if possible. Clearly mark and make sure that you can see: Any grab bars or handrails. First and last steps. Where the edge of each step is. Use tools that help you move around (mobility aids) if they are needed. These include: Canes. Walkers. Scooters. Crutches. Turn on the lights when you go into a dark area. Replace any light bulbs as soon as they burn out. Set up your furniture so you have a clear path. Avoid moving your furniture around. If any of your floors are uneven, fix them. If there are any pets around you, be aware of where they are. Review your medicines with your doctor. Some medicines can make you feel dizzy. This can increase your chance of falling. Ask your doctor what other things that you can do to help prevent falls. This information is not intended to replace advice given to you by your health care provider. Make sure you discuss any questions you have with your health care provider. Document Released: 11/16/2008 Document Revised: 06/28/2015 Document Reviewed: 02/24/2014 Elsevier Interactive Patient Education  2017 [employer].

## 2021-11-27 ENCOUNTER — Other Ambulatory Visit: Payer: Self-pay | Admitting: Internal Medicine

## 2021-11-27 MED ORDER — HYDROCODONE-ACETAMINOPHEN 10-325 MG PO TABS: 1.0000 | ORAL_TABLET | Freq: Every day | ORAL | 0 refills | Status: DC | PRN

## 2021-11-29 ENCOUNTER — Telehealth: Payer: Self-pay

## 2021-11-29 NOTE — Telephone Encounter (Signed)
Patient's wife is calling in stating looking at the bottle of tamsulosin (FLOMAX) 0.4 MG CAPS capsule they noticed that instructions say to take 2 daily when she has only ever given one. Wondering if they can get clarification.

## 2021-12-06 ENCOUNTER — Ambulatory Visit: Payer: Medicare Other | Admitting: Internal Medicine

## 2021-12-12 NOTE — Progress Notes (Signed)
Remote pacemaker transmission.   

## 2021-12-16 ENCOUNTER — Ambulatory Visit: Payer: Medicare Other | Admitting: Nurse Practitioner

## 2021-12-17 ENCOUNTER — Ambulatory Visit (INDEPENDENT_AMBULATORY_CARE_PROVIDER_SITE_OTHER): Payer: Medicare Other | Admitting: Internal Medicine

## 2021-12-17 VITALS — BP 122/64 | HR 79 | Temp 97.9°F | Ht 70.0 in | Wt 179.0 lb

## 2021-12-17 DIAGNOSIS — I10 Essential (primary) hypertension: Secondary | ICD-10-CM

## 2021-12-17 DIAGNOSIS — S51009A Unspecified open wound of unspecified elbow, initial encounter: Secondary | ICD-10-CM | POA: Insufficient documentation

## 2021-12-17 DIAGNOSIS — S51001A Unspecified open wound of right elbow, initial encounter: Secondary | ICD-10-CM | POA: Diagnosis not present

## 2021-12-17 DIAGNOSIS — R269 Unspecified abnormalities of gait and mobility: Secondary | ICD-10-CM

## 2021-12-17 DIAGNOSIS — R739 Hyperglycemia, unspecified: Secondary | ICD-10-CM

## 2021-12-17 NOTE — Assessment & Plan Note (Signed)
Lab Results  Component Value Date   HGBA1C 5.9 08/29/2021   Stable, pt to continue current medical treatment  - diet, wt control, excercise

## 2021-12-17 NOTE — Assessment & Plan Note (Signed)
Pt encouraged to walk with cane at all times outside of the home

## 2021-12-17 NOTE — Assessment & Plan Note (Signed)
BP Readings from Last 3 Encounters:  12/17/21 122/64  11/04/21 120/72  08/29/21 132/84   Stable, pt to continue medical treatment losartan 50 mg qd

## 2021-12-17 NOTE — Patient Instructions (Addendum)
Please start using the cane outside the home from now on to avoid more falls  Please continue all other medications as before, and refills have been done if requested.  Please have the pharmacy call with any other refills you may need.  Please keep your appointments with your specialists as you may have planned - GI for rectal discomfort  Please make an Appointment to return in Mar 04 2022, or sooner if needed

## 2021-12-17 NOTE — Assessment & Plan Note (Signed)
Wound rather large but scabbed and no s/s infection, pt reassured, ok to follow

## 2021-12-17 NOTE — Progress Notes (Signed)
Patient ID: FAITH BRANAN, male   DOB: 1928-07-17, 88 y.o.   MRN: [id number]        Chief Complaint: follow up recent fall with wound at right elbow, gait difficulty, htn, hyperglycemia       HPI:  CARMON SAHLI is a 88 y.o. male here with wife, after 10 days was walking on uneven ground in the yard, got off balance and fell to right elbow, causing laceration, bleeding stopped fairly quickly but wound has been scabbed and still tender sore to touch, but no redness, worsening or swelling; Has reused up to now to walk with cane.  Pt denies chest pain, increased sob or doe, wheezing, orthopnea, PND, increased LE swelling, palpitations, dizziness or syncope.   Pt denies polydipsia, polyuria, or new focal neuro s/s.          Wt Readings from Last 3 Encounters:  12/17/21 179 lb (81.2 kg)  11/04/21 175 lb (79.4 kg)  08/29/21 174 lb 2 oz (79 kg)   BP Readings from Last 3 Encounters:  12/17/21 122/64  11/04/21 120/72  08/29/21 132/84         Past Medical History:  Diagnosis Date   ABUSE, ALCOHOL, IN REMISSION 04/08/2007   ALLERGIC RHINITIS 09/29/2006   Arthritis    "hands" (07/02/2016)   Atrial fibrillation (HCC)    Atrial fibrillation with RVR ([place]) 07/02/2016   Atrial flutter ([place]) 03/28/2010   a. s/p prior rfca;  b. recurrent paroxysmal flutter 04/2012;  c. pradaxa initiated 04/2012.   BENIGN PROSTATIC HYPERTROPHY 09/29/2006   BPPV (benign paroxysmal positional vertigo) 04/08/2007   CAD (coronary artery disease)    a. LHC 2/17: EF 55-65%, LM 75, pLAD 75, oD1 75, oD2 65, D3 85, oLCx 99, oOM1 75, pRCA 25 >> CABG   Carotid stenosis    a. 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   Diverticulosis    ERECTILE DYSFUNCTION 09/29/2006   GERD (gastroesophageal reflux disease)    GLUCOSE INTOLERANCE 03/19/2010   HYPERLIPIDEMIA 09/29/2006   HYPERTENSION 09/29/2006   Long term (current) use of  anticoagulants 04/26/2010   MELANOMA, MALIGNANT, SKIN NOS 09/29/2006   other skin cancers -no further melanoma   OSTEOARTHROSIS NOS, OTHER [facility] SITE 09/29/2006   Other specified forms of hearing loss 08/10/2009   Pneumonia ~ 1969   Presence of permanent cardiac pacemaker 06/02/2016   VENTRICULAR TACHYCARDIA 09/29/2006   Past Surgical History:  Procedure Laterality Date   AV NODE ABLATION  07/02/2016   AV NODE ABLATION N/A 07/02/2016   Procedure: AV Node Ablation;  Surgeon: Deboraha Sprang, MD;  Location: [place] [facility];  Service: Cardiovascular;  Laterality: N/A;   CARDIAC CATHETERIZATION N/A 03/12/2015   Procedure: Left Heart Cath and Coronary Angiography;  Surgeon: [name], MD;  Location: [place] [facility];  Service: Cardiovascular;  Laterality: N/A;   CARDIOVERSION N/A 12/19/2015   Procedure: CARDIOVERSION;  Surgeon: [name], MD;  Location: [facility];  Service: Cardiovascular;  Laterality: N/A;   CARDIOVERSION N/A 03/24/2016   Procedure: CARDIOVERSION;  Surgeon: [name], MD;  Location: [place];  Service: Cardiovascular;  Laterality: N/A;   CATARACT EXTRACTION W/ INTRAOCULAR LENS  IMPLANT, BILATERAL Bilateral    CLIPPING OF ATRIAL APPENDAGE N/A 03/14/2015   Procedure: CLIPPING OF ATRIAL APPENDAGE;  Surgeon: [name], MD;  Location: [place];  Service: Open Heart Surgery;  Laterality: N/A;   COLONOSCOPY WITH PROPOFOL N/A 12/08/2014   Procedure: COLONOSCOPY WITH PROPOFOL;  Surgeon: [name], MD;  Location: [facility] ENDOSCOPY;  Service: Endoscopy;  Laterality: N/A;   CORONARY ARTERY BYPASS GRAFT N/A 03/14/2015   Procedure: CORONARY ARTERY BYPASS GRAFTING (CABG) x 4 (LIMA to LAD, SVG to DIAGONAL 2, SVG SEQUENTIALLY to OM1 and OM2);  Surgeon: [name], MD;  Location: [place];  Service: Open Heart Surgery;  Laterality: N/A;   INCISION AND DRAINAGE N/A 07/03/2016   Procedure: INCISION AND DRAINAGE of CHEST ABSCESS;  Surgeon: [name], MD;   Location: [place];  Service: Thoracic;  Laterality: N/A;   INSERT / REPLACE / REMOVE PACEMAKER  06/02/2016   JOINT REPLACEMENT     KNEE ARTHROSCOPY Left    MELANOMA EXCISION     "upper back"   PACEMAKER IMPLANT N/A 06/02/2016   Procedure: Pacemaker Implant;  Surgeon: Deboraha Sprang, MD;  Location: [place] [facility];  Service: Cardiovascular;  Laterality: N/A;   SHOULDER OPEN ROTATOR CUFF REPAIR Left    SKIN CANCER EXCISION Right    "cheek; real deep"   TEE WITHOUT CARDIOVERSION N/A 03/14/2015   Procedure: TRANSESOPHAGEAL ECHOCARDIOGRAM (TEE);  Surgeon: [name], MD;  Location: [place];  Service: Open Heart Surgery;  Laterality: N/A;   TEE WITHOUT CARDIOVERSION N/A 12/19/2015   Procedure: TRANSESOPHAGEAL ECHOCARDIOGRAM (TEE);  Surgeon: [name], MD;  Location: [place];  Service: Cardiovascular;  Laterality: N/A;   TEE WITHOUT CARDIOVERSION N/A 03/24/2016   Procedure: TRANSESOPHAGEAL ECHOCARDIOGRAM (TEE);  Surgeon: [name], MD;  Location: [place];  Service: Cardiovascular;  Laterality: N/A;   TONSILLECTOMY     TOTAL KNEE ARTHROPLASTY Right     reports that he has quit smoking. His smoking use included cigarettes. He has a 52.00 pack-year smoking history. He has never used smokeless tobacco. He reports that he does not drink alcohol and does not use drugs. family history includes Diabetes in his brother; Esophageal cancer in his father. Allergies  Allergen Reactions   Amiodarone Hcl Other (See Comments)    Patient reports photosensitivity   Ace Inhibitors Cough   Current Outpatient Medications on File Prior to Visit  Medication Sig Dispense Refill   b complex vitamins tablet Take 1 tablet by mouth daily.     ELIQUIS 5 MG TABS tablet TAKE 1 TABLET TWICE A DAY 180 tablet 1   HYDROcodone-acetaminophen (NORCO) 10-325 MG tablet Take 1 tablet by mouth daily as needed for severe pain. 90 tablet 0   losartan (COZAAR) 50 MG tablet Take 1 tablet by mouth once daily  90 tablet 3   Multiple Vitamin (MULTIVITAMIN) tablet Take 1 tablet by mouth daily.     omega-3 acid ethyl esters (LOVAZA) 1 g capsule Take by mouth.     pantoprazole (PROTONIX) 40 MG tablet Take 1 tablet (40 mg total) by mouth daily. 90 tablet 3   Psyllium (METAMUCIL PO) Take 15 mLs by mouth 3 (three) times daily as needed (constipation).      tamsulosin (FLOMAX) 0.4 MG CAPS capsule Take 1 capsule (0.4 mg total) by mouth in the morning and at bedtime. 180 capsule 3   atorvastatin (LIPITOR) 10 MG tablet TAKE 1 TABLET DAILY (Patient not taking: Reported on 12/17/2021) 90 tablet 3   Omega-3 Fatty Acids (FISH OIL PO) Fish Oil     Psyllium (METAMUCIL PO) Metamucil     No current facility-administered medications on file prior  to visit.        ROS:  All others reviewed and negative.  Objective        PE:  BP 122/64 (BP Location: Left Arm, Patient Position: Sitting, Cuff Size: Large)   Pulse 79   Temp 97.9 F (36.6 C) (Oral)   Ht '5\' 10"'$  (1.778 m)   Wt 179 lb (81.2 kg)   SpO2 99%   BMI 25.68 kg/m                 Constitutional: Pt appears in NAD               HENT: Head: NCAT.                Right Ear: External ear normal.                 Left Ear: External ear normal.                Eyes: . Pupils are equal, round, and reactive to light. Conjunctivae and EOM are normal               Nose: without d/c or deformity               Neck: Neck supple. Gross normal ROM               Cardiovascular: Normal rate and regular rhythm.                 Pulmonary/Chest: Effort normal and breath sounds without rales or wheezing.                Abd:  Soft, NT, ND, + BS, no organomegaly               Neurological: Pt is alert. At baseline orientation, motor grossly intact               Skin:  LE edema - none, right elbow with 3 cm laceration injury now well scabbed, no bleeding, no redness or other undue swelling or drainage, no red streaks               Psychiatric: Pt behavior is normal without  agitation   Micro: none  Cardiac tracings I have personally interpreted today:  none  Pertinent Radiological findings (summarize): none   Lab Results  Component Value Date   WBC 7.0 08/29/2021   HGB 14.3 08/29/2021   HCT 42.3 08/29/2021   PLT 204.0 08/29/2021   GLUCOSE 103 (H) 08/29/2021   CHOL 153 08/29/2021   TRIG 63.0 08/29/2021   HDL 56.10 08/29/2021   LDLDIRECT 159.2 09/29/2006   LDLCALC 84 08/29/2021   ALT 12 08/29/2021   AST 17 08/29/2021   NA 134 (L) 08/29/2021   K 4.7 08/29/2021   CL 99 08/29/2021   CREATININE 0.87 08/29/2021   BUN 13 08/29/2021   CO2 29 08/29/2021   TSH 0.77 08/29/2021   PSA 3.75 06/12/2016   INR 3.1 (A) 06/01/2018   HGBA1C 5.9 08/29/2021   Assessment/Plan:  ROMAIN ERION is a 88 y.o. White or Caucasian [1] male with  has a past medical history of ABUSE, ALCOHOL, IN REMISSION (04/08/2007), ALLERGIC RHINITIS (09/29/2006), Arthritis, Atrial fibrillation ([place]), Atrial fibrillation with RVR ([place]) (07/02/2016), Atrial flutter ([place]) (03/28/2010), BENIGN PROSTATIC HYPERTROPHY (09/29/2006), BPPV (benign paroxysmal positional vertigo) (04/08/2007), CAD (coronary artery disease), Carotid stenosis, Chronic lower back pain, COLONIC POLYPS, HX OF (06/23/2007), DISORDERS, ORGANIC INSOMNIA NOS (09/29/2006), Diverticulosis, ERECTILE  DYSFUNCTION (09/29/2006), GERD (gastroesophageal reflux disease), GLUCOSE INTOLERANCE (03/19/2010), HYPERLIPIDEMIA (09/29/2006), HYPERTENSION (09/29/2006), Long term (current) use of anticoagulants (04/26/2010), MELANOMA, MALIGNANT, SKIN NOS (09/29/2006), OSTEOARTHROSIS NOS, OTHER SPEC SITE (09/29/2006), Other specified forms of hearing loss (08/10/2009), Pneumonia (~ 1969), Presence of permanent cardiac pacemaker (06/02/2016), and VENTRICULAR TACHYCARDIA (09/29/2006).  Hyperglycemia Lab Results  Component Value Date   HGBA1C 5.9 08/29/2021   Stable, pt to continue current medical treatment  - diet, wt control, excercise   Hypertension,  uncontrolled BP Readings from Last 3 Encounters:  12/17/21 122/64  11/04/21 120/72  08/29/21 132/84   Stable, pt to continue medical treatment losartan 50 mg qd   Gait disorder Pt encouraged to walk with cane at all times outside of the home  Elbow wound Wound rather large but scabbed and no s/s infection, pt reassured, ok to follow  Followup: Return if symptoms worsen or fail to improve.  [name], MD 12/17/2021 4:36 PM [facility]

## 2021-12-30 ENCOUNTER — Other Ambulatory Visit: Payer: Self-pay | Admitting: Internal Medicine

## 2021-12-30 MED ORDER — HYDROCODONE-ACETAMINOPHEN 10-325 MG PO TABS: 1.0000 | ORAL_TABLET | Freq: Every day | ORAL | 0 refills | Status: DC | PRN

## 2022-01-06 NOTE — Telephone Encounter (Signed)
Ok to continue the flomax BID  - script is good I think

## 2022-01-06 NOTE — Telephone Encounter (Signed)
Spoke with the pts wife and was bale to confirm with her per pcp for pt to take BID.

## 2022-01-16 ENCOUNTER — Other Ambulatory Visit: Payer: Self-pay | Admitting: Internal Medicine

## 2022-01-16 DIAGNOSIS — I4891 Unspecified atrial fibrillation: Secondary | ICD-10-CM

## 2022-01-16 DIAGNOSIS — Z7901 Long term (current) use of anticoagulants: Secondary | ICD-10-CM

## 2022-01-16 NOTE — Telephone Encounter (Signed)
Eliquis '5mg'$  refill request received. Patient is 88 years old, weight-81.2kg, Crea-0.87 on 08/29/2021, Diagnosis-Afib, and last seen by Oda Kilts on 12/10/2020 and has an appt on 02/05/2022. Dose is appropriate based on dosing criteria. Will send in refill to requested pharmacy.

## 2022-01-29 ENCOUNTER — Other Ambulatory Visit: Payer: Self-pay | Admitting: Internal Medicine

## 2022-01-30 ENCOUNTER — Other Ambulatory Visit: Payer: Self-pay | Admitting: Internal Medicine

## 2022-01-30 NOTE — Telephone Encounter (Signed)
Please advise if new script is appropriate

## 2022-01-30 NOTE — Telephone Encounter (Signed)
Please refill as per office routine med refill policy (all routine meds to be refilled for 3 mo or monthly (per pt preference) up to one year from last visit, then month to month grace period for 3 mo, then further med refills will have to be denied) ? ?

## 2022-02-05 ENCOUNTER — Encounter: Payer: Self-pay | Admitting: Internal Medicine

## 2022-02-05 ENCOUNTER — Telehealth: Payer: Self-pay | Admitting: Internal Medicine

## 2022-02-05 ENCOUNTER — Ambulatory Visit: Payer: Medicare Other | Attending: Internal Medicine | Admitting: Internal Medicine

## 2022-02-05 VITALS — BP 126/84 | HR 81 | Ht 70.0 in | Wt 180.0 lb

## 2022-02-05 DIAGNOSIS — R001 Bradycardia, unspecified: Secondary | ICD-10-CM | POA: Diagnosis not present

## 2022-02-05 DIAGNOSIS — Z950 Presence of cardiac pacemaker: Secondary | ICD-10-CM | POA: Insufficient documentation

## 2022-02-05 DIAGNOSIS — I4819 Other persistent atrial fibrillation: Secondary | ICD-10-CM | POA: Diagnosis not present

## 2022-02-05 DIAGNOSIS — I4891 Unspecified atrial fibrillation: Secondary | ICD-10-CM | POA: Insufficient documentation

## 2022-02-05 DIAGNOSIS — I5030 Unspecified diastolic (congestive) heart failure: Secondary | ICD-10-CM | POA: Insufficient documentation

## 2022-02-05 DIAGNOSIS — I5032 Chronic diastolic (congestive) heart failure: Secondary | ICD-10-CM | POA: Diagnosis not present

## 2022-02-05 MED ORDER — HYDROCODONE-ACETAMINOPHEN 10-325 MG PO TABS: 1.0000 | ORAL_TABLET | Freq: Every day | ORAL | 0 refills | Status: DC | PRN

## 2022-02-05 NOTE — Telephone Encounter (Signed)
Patients wife called and is requesting that her husband's hydrocodone 10 mg be sent in to [facility]  She states that it had been requested previously and 90 days was called in.  Pharmacy told patient that it should only be sent in for 30 day supply.  They can not fill the 90 day supply  Patients #  [phone]  Last visit:  12/17/2021  Next visit:  03/04/2022

## 2022-02-05 NOTE — Patient Instructions (Signed)
Medication Instructions:  Your physician recommends that you continue on your current medications as directed. Please refer to the Current Medication list given to you today.  *If you need a refill on your cardiac medications before your next appointment, please call your pharmacy*   Lab Work: None ordered.  If you have labs (blood work) drawn today and your tests are completely normal, you will receive your results only by: MyChart Message (if you have MyChart) OR A paper copy in the mail If you have any lab test that is abnormal or we need to change your treatment, we will call you to review the results.   Testing/Procedures: None ordered.    Follow-Up: At [facility], you and your health needs are our priority.  As part of our continuing mission to provide you with exceptional heart care, we have created designated Provider Care Teams.  These Care Teams include your primary Cardiologist (physician) and Advanced Practice Providers (APPs -  Physician Assistants and Nurse Practitioners) who all work together to provide you with the care you need, when you need it.  We recommend signing up for the patient portal called "MyChart".  Sign up information is provided on this After Visit Summary.  MyChart is used to connect with patients for Virtual Visits (Telemedicine).  Patients are able to view lab/test results, encounter notes, upcoming appointments, etc.  Non-urgent messages can be sent to your provider as well.   To learn more about what you can do with MyChart, go to [email or web address].    Your next appointment:   12 months with Dr Klein  Important Information About Sugar       

## 2022-02-05 NOTE — Telephone Encounter (Signed)
Ok done erx 

## 2022-02-05 NOTE — Progress Notes (Signed)
Patient Care Team: Biagio Borg, MD as PCP - General (Internal Medicine) Jettie Booze, MD as PCP - Cardiology (Cardiology) Deboraha Sprang, MD as PCP - Electrophysiology (Cardiology) Luberta Mutter, MD as Consulting Physician (Ophthalmology) Allyn Kenner, MD as Consulting Physician (Dermatology) Izora Gala, MD as Consulting Physician (Otolaryngology)   HPI  Jonathan Orozco is a 89 y.o. male Seen in followup for outflow tract ectopy and atrial flutters for which  he underwent EP testing and 3-D mapping which failed to produce stable arrhythmias; medical therapy was undertaken.  He has been intolerant of amiodarone.-year-old atrial fibrillation.  Wanted to go see Dr. Judith Part for second opinion who recommended AV junction ablation and pacemaker implantation which was accomplished 5/18   Coronary artery disease catheterization 2/17 left main and three-vessel disease>>CABG  he also has a history of ventricular tachycardia described as emerging from the right ventricular outflow tract.   \ No chest pain.  No edema.  Some sensations that his heart is beating too fast.  Accompanied by dyspnea.  Feels like his heart rate does not slow down well after exercise   DATE TEST EF   2//17 LHC  LM and 3V CAD  2/18 TEE 55-60%           Date Cr K Hgb  7/23  0.87 4.4 14.3      Past Medical History:  Diagnosis Date   ABUSE, ALCOHOL, IN REMISSION 04/08/2007   ALLERGIC RHINITIS 09/29/2006   Arthritis    "hands" (07/02/2016)   Atrial fibrillation (HCC)    Atrial fibrillation with RVR ([place]) 07/02/2016   Atrial flutter ([place]) 03/28/2010   a. s/p prior rfca;  b. recurrent paroxysmal flutter 04/2012;  c. pradaxa initiated 04/2012.   BENIGN PROSTATIC HYPERTROPHY 09/29/2006   BPPV (benign paroxysmal positional vertigo) 04/08/2007   CAD (coronary artery disease)    a. LHC 2/17: EF 55-65%, LM 75, pLAD 75, oD1 75, oD2 65, D3 85, oLCx 99, oOM1 75, pRCA 25 >> CABG   Carotid stenosis    a. Carotid US 2/17:   Bilateral ICA 1-39% ICA   Chronic lower back pain    COLONIC POLYPS, HX OF 06/23/2007   DISORDERS, ORGANIC INSOMNIA NOS 09/29/2006   Diverticulosis    ERECTILE DYSFUNCTION 09/29/2006   GERD (gastroesophageal reflux disease)    GLUCOSE INTOLERANCE 03/19/2010   HYPERLIPIDEMIA 09/29/2006   HYPERTENSION 09/29/2006   Long term (current) use of anticoagulants 04/26/2010   MELANOMA, MALIGNANT, SKIN NOS 09/29/2006   other skin cancers -no further melanoma   OSTEOARTHROSIS NOS, OTHER [facility] SITE 09/29/2006   Other specified forms of hearing loss 08/10/2009   Pneumonia ~ 1969   Presence of permanent cardiac pacemaker 06/02/2016   VENTRICULAR TACHYCARDIA 09/29/2006    Past Surgical History:  Procedure Laterality Date   AV NODE ABLATION  07/02/2016   AV NODE ABLATION N/A 07/02/2016   Procedure: AV Node Ablation;  Surgeon: Deboraha Sprang, MD;  Location: [place] CV [facility];  Service: Cardiovascular;  Laterality: N/A;   CARDIAC CATHETERIZATION N/A 03/12/2015   Procedure: Left Heart Cath and Coronary Angiography;  Surgeon: [name], MD;  Location: [place] [facility];  Service: Cardiovascular;  Laterality: N/A;   CARDIOVERSION N/A 12/19/2015   Procedure: CARDIOVERSION;  Surgeon: [name], MD;  Location: [place];  Service: Cardiovascular;  Laterality: N/A;   CARDIOVERSION N/A 03/24/2016   Procedure: CARDIOVERSION;  Surgeon: [name], MD;  Location: [place];  Service: Cardiovascular;  Laterality: N/A;  CATARACT EXTRACTION W/ INTRAOCULAR LENS  IMPLANT, BILATERAL Bilateral    CLIPPING OF ATRIAL APPENDAGE N/A 03/14/2015   Procedure: CLIPPING OF ATRIAL APPENDAGE;  Surgeon: [name], MD;  Location: [place];  Service: Open Heart Surgery;  Laterality: N/A;   COLONOSCOPY WITH PROPOFOL N/A 12/08/2014   Procedure: COLONOSCOPY WITH PROPOFOL;  Surgeon: [name], MD;  [facility];  Service: Endoscopy;  Laterality: N/A;   CORONARY ARTERY BYPASS GRAFT N/A 03/14/2015   Procedure:  CORONARY ARTERY BYPASS GRAFTING (CABG) x 4 (LIMA to LAD, SVG to DIAGONAL 2, SVG SEQUENTIALLY to OM1 and OM2);  Surgeon: [name], MD;  Location: [place];  Service: Open Heart Surgery;  Laterality: N/A;   INCISION AND DRAINAGE N/A 07/03/2016   Procedure: INCISION AND DRAINAGE of CHEST ABSCESS;  Surgeon: [name], MD;  Location: [place];  Service: Thoracic;  Laterality: N/A;   INSERT / REPLACE / REMOVE PACEMAKER  06/02/2016   JOINT REPLACEMENT     KNEE ARTHROSCOPY Left    MELANOMA EXCISION     "upper back"   PACEMAKER IMPLANT N/A 06/02/2016   Procedure: Pacemaker Implant;  Surgeon: Deboraha Sprang, MD;  Location: [place] CV LAB;  Service: Cardiovascular;  Laterality: N/A;   SHOULDER OPEN ROTATOR CUFF REPAIR Left    SKIN CANCER EXCISION Right    "cheek; real deep"   TEE WITHOUT CARDIOVERSION N/A 03/14/2015   Procedure: TRANSESOPHAGEAL ECHOCARDIOGRAM (TEE);  Surgeon: [name], MD;  Location: [place];  Service: Open Heart Surgery;  Laterality: N/A;   TEE WITHOUT CARDIOVERSION N/A 12/19/2015   Procedure: TRANSESOPHAGEAL ECHOCARDIOGRAM (TEE);  Surgeon: [name], MD;  Location: [place];  Service: Cardiovascular;  Laterality: N/A;   TEE WITHOUT CARDIOVERSION N/A 03/24/2016   Procedure: TRANSESOPHAGEAL ECHOCARDIOGRAM (TEE);  Surgeon: [name], MD;  Location: [facility];  Service: Cardiovascular;  Laterality: N/A;   TONSILLECTOMY     TOTAL KNEE ARTHROPLASTY Right     Current Outpatient Medications  Medication Sig Dispense Refill   b complex vitamins tablet Take 1 tablet by mouth daily.     ELIQUIS 5 MG TABS tablet TAKE 1 TABLET TWICE A DAY 180 tablet 1   HYDROcodone-acetaminophen (NORCO) 10-325 MG tablet Take 1 tablet by mouth daily as needed for severe pain. 90 tablet 0   losartan (COZAAR) 50 MG tablet Take 1 tablet by mouth once daily 90 tablet 3   Multiple Vitamin (MULTIVITAMIN) tablet Take 1 tablet by mouth daily.     Omega-3 Fatty Acids (FISH  OIL PO) Fish Oil     pantoprazole (PROTONIX) 40 MG tablet Take 1 tablet by mouth once daily 90 tablet 3   Psyllium (METAMUCIL PO) Take 15 mLs by mouth 3 (three) times daily as needed (constipation).      Psyllium (METAMUCIL PO) Metamucil     tamsulosin (FLOMAX) 0.4 MG CAPS capsule Take 1 capsule (0.4 mg total) by mouth in the morning and at bedtime. 180 capsule 3   atorvastatin (LIPITOR) 10 MG tablet TAKE 1 TABLET DAILY (Patient not taking: Reported on 02/05/2022) 90 tablet 3   omega-3 acid ethyl esters (LOVAZA) 1 g capsule Take by mouth. (Patient not taking: Reported on 02/05/2022)     No current facility-administered medications for this visit.    Allergies  Allergen Reactions   Amiodarone Hcl Other (See Comments)    Patient reports photosensitivity   Ace Inhibitors Cough    Review of Systems negative except from HPI and PMH  Physical Exam BP 126/84   Pulse 81   Ht '5\' 10"'$  (1.778 m)   Wt 180 lb (81.6 kg)   SpO2 99%   BMI 25.83 kg/m  Well developed and well nourished in no acute distress HENT normal Neck supple   Clear Device pocket well healed; without hematoma or erythema.  There is no tethering  Regular rate and rhythm, no murmur Abd-soft with active BS No Clubbing cyanosis   edema Skin-warm and dry A & Oriented  Grossly normal sensory and motor function  ECG atrial fibrillation with ventricular pacing at 67 with  Device function is normal. Programming changes were made to decrease the rate response in the ADL rate from 95--90 and the slope for 3--1.  After after changing the slope 3--2 we walked him.  Heart rates were rapidly at 90.  Hence the further down programming See Paceart for details      Assessment and  Plan  Atrial fibrillation/flutter permanent  Complete heart block  Pacemaker Medtronic   Hypertension  CAD s/p CABG   HFpEF     PM Med[employer]onic    No ischemia.  Continue his Eliquis.  Pressure is well-controlled.  Continue him on his  losartan  Euvolemic.[name]  Not on any diuretics  Reprogramming as above.

## 2022-02-06 NOTE — Telephone Encounter (Signed)
Patient aware.

## 2022-02-06 NOTE — Telephone Encounter (Signed)
LVM that prescription sent

## 2022-02-11 ENCOUNTER — Ambulatory Visit: Payer: Medicare Other | Admitting: Internal Medicine

## 2022-02-17 ENCOUNTER — Ambulatory Visit (INDEPENDENT_AMBULATORY_CARE_PROVIDER_SITE_OTHER): Payer: Medicare Other

## 2022-02-17 DIAGNOSIS — I4819 Other persistent atrial fibrillation: Secondary | ICD-10-CM

## 2022-02-18 ENCOUNTER — Other Ambulatory Visit: Payer: Self-pay

## 2022-02-18 ENCOUNTER — Telehealth: Payer: Self-pay | Admitting: Internal Medicine

## 2022-02-18 LAB — CUP PACEART REMOTE DEVICE CHECK
Battery Remaining Longevity: 83 mo
Battery Voltage: 2.98 V
Brady Statistic RV Percent Paced: 99.93 %
Date Time Interrogation Session: 20240115084333
Implantable Lead Connection Status: 753985
Implantable Lead Implant Date: 20180430
Implantable Lead Location: 753860
Implantable Lead Model: 5076
Implantable Pulse Generator Implant Date: 20180430
Lead Channel Impedance Value: 361 Ohm
Lead Channel Impedance Value: 589 Ohm
Lead Channel Pacing Threshold Amplitude: 0.75 V
Lead Channel Pacing Threshold Pulse Width: 0.4 ms
Lead Channel Sensing Intrinsic Amplitude: 11 mV
Lead Channel Sensing Intrinsic Amplitude: 6.5 mV
Lead Channel Setting Pacing Amplitude: 2.5 V
Lead Channel Setting Pacing Pulse Width: 0.4 ms
Lead Channel Setting Sensing Sensitivity: 1.2 mV
Zone Setting Status: 755011

## 2022-02-18 MED ORDER — LOSARTAN POTASSIUM 50 MG PO TABS: 50.0000 mg | ORAL_TABLET | Freq: Every day | ORAL | 3 refills | Status: DC

## 2022-02-18 NOTE — Telephone Encounter (Signed)
Rx request sent to pharmacy, patient and spouse notified

## 2022-02-18 NOTE — Telephone Encounter (Signed)
Patient needs a refill on his losartan 50 mg. For a 90 day supply from [employer] [facility].  Patient's number:  [phone]  Next visit:  03/04/2022

## 2022-03-04 ENCOUNTER — Encounter: Payer: Self-pay | Admitting: Internal Medicine

## 2022-03-04 ENCOUNTER — Ambulatory Visit (INDEPENDENT_AMBULATORY_CARE_PROVIDER_SITE_OTHER): Payer: Medicare Other | Admitting: Internal Medicine

## 2022-03-04 VITALS — BP 124/66 | HR 70 | Temp 98.4°F | Ht 70.0 in | Wt 179.0 lb

## 2022-03-04 DIAGNOSIS — E782 Mixed hyperlipidemia: Secondary | ICD-10-CM

## 2022-03-04 DIAGNOSIS — R739 Hyperglycemia, unspecified: Secondary | ICD-10-CM

## 2022-03-04 DIAGNOSIS — C439 Malignant melanoma of skin, unspecified: Secondary | ICD-10-CM | POA: Diagnosis not present

## 2022-03-04 DIAGNOSIS — Z0001 Encounter for general adult medical examination with abnormal findings: Secondary | ICD-10-CM | POA: Diagnosis not present

## 2022-03-04 DIAGNOSIS — R972 Elevated prostate specific antigen [PSA]: Secondary | ICD-10-CM | POA: Diagnosis not present

## 2022-03-04 DIAGNOSIS — H6123 Impacted cerumen, bilateral: Secondary | ICD-10-CM | POA: Diagnosis not present

## 2022-03-04 DIAGNOSIS — I10 Essential (primary) hypertension: Secondary | ICD-10-CM

## 2022-03-04 DIAGNOSIS — E559 Vitamin D deficiency, unspecified: Secondary | ICD-10-CM

## 2022-03-04 DIAGNOSIS — E538 Deficiency of other specified B group vitamins: Secondary | ICD-10-CM

## 2022-03-04 LAB — HEPATIC FUNCTION PANEL
ALT: 15 U/L (ref 0–53)
AST: 21 U/L (ref 0–37)
Albumin: 4.2 g/dL (ref 3.5–5.2)
Alkaline Phosphatase: 99 U/L (ref 39–117)
Bilirubin, Direct: 0.1 mg/dL (ref 0.0–0.3)
Total Bilirubin: 0.6 mg/dL (ref 0.2–1.2)
Total Protein: 6.6 g/dL (ref 6.0–8.3)

## 2022-03-04 LAB — CBC WITH DIFFERENTIAL/PLATELET
Basophils Absolute: 0 10*3/uL (ref 0.0–0.1)
Basophils Relative: 0.2 % (ref 0.0–3.0)
Eosinophils Absolute: 0.1 10*3/uL (ref 0.0–0.7)
Eosinophils Relative: 1.3 % (ref 0.0–5.0)
HCT: 40.7 % (ref 39.0–52.0)
Hemoglobin: 13.8 g/dL (ref 13.0–17.0)
Lymphocytes Relative: 51.2 % — ABNORMAL HIGH (ref 12.0–46.0)
Lymphs Abs: 3.8 10*3/uL (ref 0.7–4.0)
MCHC: 33.9 g/dL (ref 30.0–36.0)
MCV: 96.1 fl (ref 78.0–100.0)
Monocytes Absolute: 0.8 10*3/uL (ref 0.1–1.0)
Monocytes Relative: 10.2 % (ref 3.0–12.0)
Neutro Abs: 2.7 10*3/uL (ref 1.4–7.7)
Neutrophils Relative %: 37.1 % — ABNORMAL LOW (ref 43.0–77.0)
Platelets: 230 10*3/uL (ref 150.0–400.0)
RBC: 4.24 Mil/uL (ref 4.22–5.81)
RDW: 13.3 % (ref 11.5–15.5)
WBC: 7.4 10*3/uL (ref 4.0–10.5)

## 2022-03-04 LAB — LIPID PANEL
Cholesterol: 178 mg/dL (ref 0–200)
HDL: 59.2 mg/dL (ref >39.00)
LDL Cholesterol: 106 mg/dL — ABNORMAL HIGH (ref 0–99)
NonHDL: 118.76
Total CHOL/HDL Ratio: 3
Triglycerides: 65 mg/dL (ref 0.0–149.0)
VLDL: 13 mg/dL (ref 0.0–40.0)

## 2022-03-04 LAB — URINALYSIS, ROUTINE W REFLEX MICROSCOPIC
Bilirubin Urine: NEGATIVE
Hgb urine dipstick: NEGATIVE
Ketones, ur: NEGATIVE
Leukocytes,Ua: NEGATIVE
Nitrite: NEGATIVE
RBC / HPF: NONE SEEN (ref 0–?)
Specific Gravity, Urine: 1.02 (ref 1.000–1.030)
Total Protein, Urine: NEGATIVE
Urine Glucose: NEGATIVE
Urobilinogen, UA: 0.2 (ref 0.0–1.0)
pH: 6 (ref 5.0–8.0)

## 2022-03-04 LAB — BASIC METABOLIC PANEL
BUN: 16 mg/dL (ref 6–23)
CO2: 28 mEq/L (ref 19–32)
Calcium: 9.4 mg/dL (ref 8.4–10.5)
Chloride: 99 mEq/L (ref 96–112)
Creatinine, Ser: 0.93 mg/dL (ref 0.40–1.50)
GFR: 71.5 mL/min (ref >60.00)
Glucose, Bld: 94 mg/dL (ref 70–99)
Potassium: 5 mEq/L (ref 3.5–5.1)
Sodium: 134 mEq/L — ABNORMAL LOW (ref 135–145)

## 2022-03-04 LAB — VITAMIN B12: Vitamin B-12: 385 pg/mL (ref 211–911)

## 2022-03-04 LAB — HEMOGLOBIN A1C: Hgb A1c MFr Bld: 5.7 % (ref 4.6–6.5)

## 2022-03-04 LAB — TSH: TSH: 0.9 u[IU]/mL (ref 0.35–5.50)

## 2022-03-04 LAB — PSA: PSA: 15.86 ng/mL — ABNORMAL HIGH (ref 0.10–4.00)

## 2022-03-04 LAB — VITAMIN D 25 HYDROXY (VIT D DEFICIENCY, FRACTURES): VITD: 53.48 ng/mL (ref 30.00–100.00)

## 2022-03-04 NOTE — Progress Notes (Signed)
Patient ID: Jonathan Orozco, male   DOB: [birth year], 89 y.o.   MRN: [id number]         Chief Complaint:: wellness exam and bilateral ear wax impactions with reduced hearing, htn, hyperglycemia, hld       HPI:  Jonathan Orozco is a 89 y.o. male here for wellness exam; declines covid booster, for shingrix at pharmacy, o/w up to date                        Also here with 1 wk acute on chronic hearing loss and bialteral wax impactions unable to be resolved with home irrigation; Pt denies chest pain, increased sob or doe, wheezing, orthopnea, PND, increased LE swelling, palpitations, dizziness or syncope.   Pt denies polydipsia, polyuria, or new focal neuro s/s.    Pt denies fever, wt loss, night sweats, loss of appetite, or other constitutional symptoms    Wt Readings from Last 3 Encounters:  03/04/22 179 lb (81.2 kg)  02/05/22 180 lb (81.6 kg)  12/17/21 179 lb (81.2 kg)   BP Readings from Last 3 Encounters:  03/04/22 124/66  02/05/22 126/84  12/17/21 122/64   Immunization History  Administered Date(s) Administered   Fluad Quad(high Dose 65+) 10/08/2018, 11/10/2019, 10/19/2020, 10/30/2021   H1N1 03/14/2008   Influenza Split 11/11/2010, 10/10/2011   Influenza Whole 01/20/2007, 11/02/2007, 03/16/2009, 11/16/2009   Influenza, High Dose Seasonal PF 10/21/2013, 11/20/2015, 11/25/2016, 11/17/2017   Influenza,inj,Quad PF,6+ Mos 11/09/2012   Influenza-Unspecified 11/24/2014   PFIZER Comirnaty(Gray Top)Covid-19 Tri-Sucrose Vaccine 08/21/2020, 10/30/2021   PFIZER(Purple Top)SARS-COV-2 Vaccination 03/17/2019, 04/11/2019, 11/22/2019   Pfizer Covid-19 Vaccine Bivalent Booster 65yr & up 01/10/2021   Pneumococcal Conjugate-13 05/24/2013   Pneumococcal Polysaccharide-23 03/31/2006   Td 03/31/2006   Tdap 06/19/2016  There are no preventive care reminders to display for this patient.    Past Medical History:  Diagnosis Date   ABUSE, ALCOHOL, IN REMISSION 04/08/2007   ALLERGIC RHINITIS 09/29/2006    Arthritis    "hands" (07/02/2016)   [place]    BENIGN PROSTATIC HYPERTROPHY 09/29/2006   BPPV (benign paroxysmal positional vertigo) 04/08/2007   CAD (coronary artery disease)    a. LHC 2/17: EF 55-65%, LM 75, pLAD 75, oD1 75, oD2 65, D3 85, oLCx 99, oOM1 75, pRCA 25 >> CABG   Carotid stenosis    a. Carotid [place]:  Bilateral ICA 1-39% ICA   Chronic lower back pain    COLONIC POLYPS, HX OF 06/23/2007   DISORDERS, ORGANIC INSOMNIA NOS 09/29/2006   Diverticulosis    ERECTILE DYSFUNCTION 09/29/2006   GERD (gastroesophageal reflux disease)    GLUCOSE INTOLERANCE 03/19/2010   HYPERLIPIDEMIA 09/29/2006   HYPERTENSION 09/29/2006   Long term (current) use of anticoagulants 04/26/2010   MELANOMA, MALIGNANT, SKIN NOS 09/29/2006   other skin cancers -no further melanoma   [facility]   Other specified forms of hearing loss 08/10/2009   Pneumonia ~ 1969   Presence of permanent cardiac pacemaker 06/02/2016   VENTRICULAR TACHYCARDIA 09/29/2006   Past Surgical History:  Procedure Laterality Date   AV NODE ABLATION N/A 07/02/2016   Procedure: AV Node Ablation;  Surgeon: KDeboraha Sprang MD;  [facility] [place];  Service: Cardiovascular;  Laterality: N/A;   CARDIAC CATHETERIZATION N/A 03/12/2015   Procedure: Left Heart Cath and Coronary Angiography;  Surgeon: HBelva Crome MD;  [place];  Service: Cardiovascular;  Laterality: N/A;  CARDIOVERSION N/A 12/19/2015   Procedure: CARDIOVERSION;  Surgeon: [name], MD;  Location: [place];  Service: Cardiovascular;  Laterality: N/A;   CARDIOVERSION N/A 03/24/2016   Procedure: CARDIOVERSION;  Surgeon: [name], MD;  Location: [place];  Service: Cardiovascular;  Laterality: N/A;   CATARACT EXTRACTION W/ INTRAOCULAR LENS  IMPLANT, BILATERAL Bilateral    CLIPPING OF ATRIAL APPENDAGE N/A 03/14/2015   Procedure: CLIPPING OF ATRIAL APPENDAGE;  Surgeon:  [name], MD;  Location: [place];  Service: Open Heart Surgery;  Laterality: N/A;   COLONOSCOPY WITH PROPOFOL N/A 12/08/2014   Procedure: COLONOSCOPY WITH PROPOFOL;  Surgeon: [name], MD;  [facility];  Service: Endoscopy;  Laterality: N/A;   CORONARY ARTERY BYPASS GRAFT N/A 03/14/2015   Procedure: CORONARY ARTERY BYPASS GRAFTING (CABG) x 4 (LIMA to LAD, SVG to DIAGONAL 2, SVG SEQUENTIALLY to OM1 and OM2);  Surgeon: [name], MD;  Location: [place];  Service: Open Heart Surgery;  Laterality: N/A;   INCISION AND DRAINAGE N/A 07/03/2016   Procedure: INCISION AND DRAINAGE of CHEST ABSCESS;  Surgeon: [name], MD;  Location: [place];  Service: Thoracic;  Laterality: N/A;   JOINT REPLACEMENT     KNEE ARTHROSCOPY Left    MELANOMA EXCISION     "upper back"   PACEMAKER IMPLANT N/A 06/02/2016   Procedure: Pacemaker Implant;  Surgeon: Deboraha Sprang, MD;  Location: [place] [facility] LAB;  Service: Cardiovascular;  Laterality: N/A;   SHOULDER OPEN ROTATOR CUFF REPAIR Left    SKIN CANCER EXCISION Right    "cheek; real deep"   TEE WITHOUT CARDIOVERSION N/A 03/14/2015   Procedure: TRANSESOPHAGEAL ECHOCARDIOGRAM (TEE);  Surgeon: [name], MD;  Location: [place];  Service: Open Heart Surgery;  Laterality: N/A;   TEE WITHOUT CARDIOVERSION N/A 12/19/2015   Procedure: TRANSESOPHAGEAL ECHOCARDIOGRAM (TEE);  Surgeon: [name], MD;  Location: [place];  Service: Cardiovascular;  Laterality: N/A;   TEE WITHOUT CARDIOVERSION N/A 03/24/2016   Procedure: TRANSESOPHAGEAL ECHOCARDIOGRAM (TEE);  Surgeon: [name], MD;  Location: [place];  Service: Cardiovascular;  Laterality: N/A;   TONSILLECTOMY     TOTAL KNEE ARTHROPLASTY Right     reports that he has quit smoking. His smoking use included cigarettes. He has a 52.00 pack-year smoking history. He has never used smokeless tobacco. He reports that he does not drink alcohol and does not use  drugs. family history includes Diabetes in his brother; Esophageal cancer in his father. Allergies  Allergen Reactions   Amiodarone Hcl Other (See Comments)    Patient reports photosensitivity   Ace Inhibitors Cough   Current Outpatient Medications on File Prior to Visit  Medication Sig Dispense Refill   b complex vitamins tablet Take 1 tablet by mouth daily.     ELIQUIS 5 MG TABS tablet TAKE 1 TABLET TWICE A DAY 180 tablet 1   HYDROcodone-acetaminophen (NORCO) 10-325 MG tablet Take 1 tablet by mouth daily as needed for severe pain. 90 tablet 0   losartan (COZAAR) 50 MG tablet Take 1 tablet (50 mg total) by mouth daily. 90 tablet 3   Multiple Vitamin (MULTIVITAMIN) tablet Take 1 tablet by mouth daily.     Omega-3 Fatty Acids (FISH OIL PO) Fish Oil     pantoprazole (PROTONIX) 40 MG tablet Take 1 tablet by mouth once daily 90 tablet 3   Psyllium (METAMUCIL PO) Take 15 mLs by mouth 3 (three) times daily as needed (constipation).  Psyllium (METAMUCIL PO) Metamucil     tamsulosin (FLOMAX) 0.4 MG CAPS capsule Take 1 capsule (0.4 mg total) by mouth in the morning and at bedtime. 180 capsule 3   atorvastatin (LIPITOR) 10 MG tablet TAKE 1 TABLET DAILY (Patient not taking: Reported on 02/05/2022) 90 tablet 3   omega-3 acid ethyl esters (LOVAZA) 1 g capsule Take by mouth. (Patient not taking: Reported on 02/05/2022)     No current facility-administered medications on file prior to visit.        ROS:  All others reviewed and negative.  Objective        PE:  BP 124/66 (BP Location: Left Arm, Patient Position: Sitting, Cuff Size: Large)   Pulse 70   Temp 98.4 F (36.9 C) (Oral)   Ht '5\' 10"'$  (1.778 m)   Wt 179 lb (81.2 kg)   SpO2 98%   BMI 25.68 kg/m                 Constitutional: Pt appears in NAD               HENT: Head: NCAT.                Right Ear: External ear normal.  , bilateral canal wax impactions noted               Left Ear: External ear normal.                Eyes: . Pupils  are equal, round, and reactive to light. Conjunctivae and EOM are normal               Nose: without d/c or deformity               Neck: Neck supple. Gross normal ROM               Cardiovascular: Normal rate and regular rhythm.                 Pulmonary/Chest: Effort normal and breath sounds without rales or wheezing.                Abd:  Soft, NT, ND, + BS, no organomegaly               Neurological: Pt is alert. At baseline orientation, motor grossly intact               Skin: Skin is warm. No rashes, no other new lesions, LE edema - none               Psychiatric: Pt behavior is normal without agitation   Micro: none  Cardiac tracings I have personally interpreted today:  none  Pertinent Radiological findings (summarize): none   Lab Results  Component Value Date   WBC 7.4 03/04/2022   HGB 13.8 03/04/2022   HCT 40.7 03/04/2022   PLT 230.0 03/04/2022   GLUCOSE 94 03/04/2022   CHOL 178 03/04/2022   TRIG 65.0 03/04/2022   HDL 59.20 03/04/2022   LDLDIRECT 159.2 09/29/2006   LDLCALC 106 (H) 03/04/2022   ALT 15 03/04/2022   AST 21 03/04/2022   NA 134 (L) 03/04/2022   K 5.0 03/04/2022   CL 99 03/04/2022   CREATININE 0.93 03/04/2022   BUN 16 03/04/2022   CO2 28 03/04/2022   TSH 0.90 03/04/2022   PSA 15.86 (H) 03/04/2022   INR 3.1 (A) 06/01/2018   HGBA1C 5.7 03/04/2022   Assessment/Plan:  Elyse Jarvis  Dorton is a 89 y.o. White or Caucasian [1] male with  has a past medical history of ABUSE, ALCOHOL, IN REMISSION (04/08/2007), ALLERGIC RHINITIS (09/29/2006), Arthritis, Atrial fibrillation ([place]), BENIGN PROSTATIC HYPERTROPHY (09/29/2006), BPPV (benign paroxysmal positional vertigo) (04/08/2007), CAD (coronary artery disease), Carotid stenosis, Chronic lower back pain, COLONIC POLYPS, HX OF (06/23/2007), DISORDERS, ORGANIC INSOMNIA NOS (09/29/2006), Diverticulosis, ERECTILE DYSFUNCTION (09/29/2006), GERD (gastroesophageal reflux disease), GLUCOSE INTOLERANCE (03/19/2010), HYPERLIPIDEMIA  (09/29/2006), HYPERTENSION (09/29/2006), Long term (current) use of anticoagulants (04/26/2010), MELANOMA, MALIGNANT, SKIN NOS (09/29/2006), OSTEOARTHROSIS NOS, OTHER SPEC SITE (09/29/2006), Other specified forms of hearing loss (08/10/2009), Pneumonia (~ 1969), Presence of permanent cardiac pacemaker (06/02/2016), and VENTRICULAR TACHYCARDIA (09/29/2006).  Encounter for well adult exam with abnormal findings Age and sex appropriate education and counseling updated with regular exercise and diet Referrals for preventative services - none needed Immunizations addressed - delcines covid booster, for shingrix at pharmacy Smoking counseling  - none needed Evidence for depression or other mood disorder - none significant Most recent labs reviewed. I have personally reviewed and have noted: 1) the patient's medical and social history 2) The patient's current medications and supplements 3) The patient's height, weight, and BMI have been recorded in the chart   Bilateral impacted cerumen With recent wrosening hearing acute on chronic, pt requests ENT referral  Hyperglycemia Lab Results  Component Value Date   HGBA1C 5.7 03/04/2022   Stable, pt to continue current medical treatment  - diet, wt control   Hyperlipidemia Lab Results  Component Value Date   LDLCALC 106 (H) 03/04/2022   Uncontrolled,  pt to continue current statin liptior 10 mg as declines change for now, also for lower chol diet   Hypertension, uncontrolled BP Readings from Last 3 Encounters:  03/04/22 124/66  02/05/22 126/84  12/17/21 122/64   Stable, pt to continue medical treatment losartan 50 mg qd   PSA elevation Pt requests psa  Lab Results  Component Value Date   PSA 15.86 (H) 03/04/2022   PSA 3.75 06/12/2016   PSA 1.96 03/16/2009    Now elevated, pt to consider whether he wants to see urology and let us know  Followup: Return in about 6 months (around 09/02/2022).  [name], MD 03/04/2022 7:30  PM [facility]

## 2022-03-04 NOTE — Assessment & Plan Note (Signed)
Age and sex appropriate education and counseling updated with regular exercise and diet Referrals for preventative services - none needed Immunizations addressed - delcines covid booster, for shingrix at pharmacy Smoking counseling  - none needed Evidence for depression or other mood disorder - none significant Most recent labs reviewed. I have personally reviewed and have noted: 1) the patient's medical and social history 2) The patient's current medications and supplements 3) The patient's height, weight, and BMI have been recorded in the chart

## 2022-03-04 NOTE — Assessment & Plan Note (Signed)
Lab Results  Component Value Date   LDLCALC 106 (H) 03/04/2022   Uncontrolled,  pt to continue current statin liptior 10 mg as declines change for now, also for lower chol diet

## 2022-03-04 NOTE — Assessment & Plan Note (Signed)
BP Readings from Last 3 Encounters:  03/04/22 124/66  02/05/22 126/84  12/17/21 122/64   Stable, pt to continue medical treatment losartan 50 mg qd

## 2022-03-04 NOTE — Assessment & Plan Note (Signed)
Lab Results  Component Value Date   HGBA1C 5.7 03/04/2022   Stable, pt to continue current medical treatment  - diet, wt control

## 2022-03-04 NOTE — Assessment & Plan Note (Signed)
With recent wrosening hearing acute on chronic, pt requests ENT referral

## 2022-03-04 NOTE — Assessment & Plan Note (Signed)
Pt requests psa  Lab Results  Component Value Date   PSA 15.86 (H) 03/04/2022   PSA 3.75 06/12/2016   PSA 1.96 03/16/2009    Now elevated, pt to consider whether he wants to see urology and let us know

## 2022-03-04 NOTE — Patient Instructions (Addendum)
Please have your Shingrix (shingles) shots done at your local pharmacy.  Please see Dr Constance Holster ENT as you mentioned for the ear wax removal  Please continue all other medications as before, and refills have been done if requested.  Please have the pharmacy call with any other refills you may need.  Please continue your efforts at being more active, low cholesterol diet, and weight control.  You are otherwise up to date with prevention measures today.  Please keep your appointments with your specialists as you may have planned  Please go to the LAB at the blood drawing area for the tests to be done  You will be contacted by phone if any changes need to be made immediately.  Otherwise, you will receive a letter about your results with an explanation, but please check with MyChart first.  Please remember to sign up for MyChart if you have not done so, as this will be important to you in the future with finding out test results, communicating by private email, and scheduling acute appointments online when needed.  Please make an Appointment to return in 6 months, or sooner if needed

## 2022-03-10 ENCOUNTER — Other Ambulatory Visit: Payer: Self-pay | Admitting: Internal Medicine

## 2022-03-10 MED ORDER — HYDROCODONE-ACETAMINOPHEN 10-325 MG PO TABS: 1.0000 | ORAL_TABLET | Freq: Every day | ORAL | 0 refills | Status: DC | PRN

## 2022-03-11 ENCOUNTER — Other Ambulatory Visit: Payer: Self-pay | Admitting: Internal Medicine

## 2022-03-11 DIAGNOSIS — M1990 Unspecified osteoarthritis, unspecified site: Secondary | ICD-10-CM

## 2022-03-11 MED ORDER — HYDROCODONE-ACETAMINOPHEN 10-325 MG PO TABS: 1.0000 | ORAL_TABLET | Freq: Every day | ORAL | 0 refills | Status: DC | PRN

## 2022-03-25 NOTE — Progress Notes (Unsigned)
Cardiology Office Note   Date:  03/26/2022   ID:  Jonathan Orozco, DOB 15-Apr-1928, MRN [id number]  PCP:  Biagio Borg, MD    No chief complaint on file.  AFib  Wt Readings from Last 3 Encounters:  03/26/22 177 lb 3.2 oz (80.4 kg)  03/04/22 179 lb (81.2 kg)  02/05/22 180 lb (81.6 kg)       History of Present Illness: Jonathan Orozco is a 89 y.o. male  with history of CAD (03/14/15 CABG  LIMA > LAD, SVG > 2nd Diag and OM 1/2 , also had LAA clipping ), HTN, HFpEF, symptomatic bradycardia w/PPM, uncontrolled rapid AFib, Atypical AFlutter now s/p AVN ablation, RVOT VT intolerant of amiodarone (patient states with sun sensitivity and visual changes), chronic LBP. S/p left atrial appendage clipping.   He saw neurology, Dr. Tomi Likens 12/08/16 2/2 gait instability, exam not felt to suggest intracranial  issue, and referred to PT.     He is sent here for further eval due to severe nosebleeds.  He went to the ER and was treated with packing and sliver nitrates.  Eliquis was held briefly, but he had more nosebleeds when he restarted.  He went back to the ER.  Since 06/16/19, he has stayed of of Eliquis.  He saw ENT and had something cauterized in his nose on 5/21 with silver nitrate sticks.     Past Medical History:  Diagnosis Date   ABUSE, ALCOHOL, IN REMISSION 04/08/2007   ALLERGIC RHINITIS 09/29/2006   Arthritis    "hands" (07/02/2016)   Atrial fibrillation ([place])    BENIGN PROSTATIC HYPERTROPHY 09/29/2006   BPPV (benign paroxysmal positional vertigo) 04/08/2007   CAD (coronary artery disease)    a. LHC 2/17: EF 55-65%, LM 75, pLAD 75, oD1 75, oD2 65, D3 85, oLCx 99, oOM1 75, pRCA 25 >> CABG   Carotid stenosis    a. 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   Diverticulosis    ERECTILE DYSFUNCTION 09/29/2006   GERD (gastroesophageal reflux disease)    GLUCOSE INTOLERANCE 03/19/2010    HYPERLIPIDEMIA 09/29/2006   HYPERTENSION 09/29/2006   Long term (current) use of anticoagulants 04/26/2010   MELANOMA, MALIGNANT, SKIN NOS 09/29/2006   other skin cancers -no further melanoma   OSTEOARTHROSIS NOS, OTHER [facility] SITE 09/29/2006   Other specified forms of hearing loss 08/10/2009   Pneumonia ~ 1969   Presence of permanent cardiac pacemaker 06/02/2016   VENTRICULAR TACHYCARDIA 09/29/2006    Past Surgical History:  Procedure Laterality Date   AV NODE ABLATION N/A 07/02/2016   Procedure: AV Node Ablation;  Surgeon: Deboraha Sprang, MD;  Location: [place] CV LAB;  Service: Cardiovascular;  Laterality: N/A;   CARDIAC CATHETERIZATION N/A 03/12/2015   Procedure: Left Heart Cath and Coronary Angiography;  Surgeon: [name], MD;  Location: [place] CV LAB;  Service: Cardiovascular;  Laterality: N/A;   CARDIOVERSION N/A 12/19/2015   Procedure: CARDIOVERSION;  Surgeon: [name], MD;  Location: [facility] ENDOSCOPY;  Service: Cardiovascular;  Laterality: N/A;   CARDIOVERSION N/A 03/24/2016   Procedure: CARDIOVERSION;  Surgeon: [name], MD;  Location: [place];  Service: Cardiovascular;  Laterality: N/A;   CATARACT EXTRACTION W/ INTRAOCULAR LENS  IMPLANT, BILATERAL Bilateral    CLIPPING OF ATRIAL APPENDAGE N/A 03/14/2015   Procedure: CLIPPING OF ATRIAL APPENDAGE;  Surgeon:  [name], MD;  Location: [place];  Service: Open Heart Surgery;  Laterality: N/A;   COLONOSCOPY WITH PROPOFOL N/A 12/08/2014   Procedure: COLONOSCOPY WITH PROPOFOL;  Surgeon: [name], MD;  Location: [facility] ENDOSCOPY;  Service: Endoscopy;  Laterality: N/A;   CORONARY ARTERY BYPASS GRAFT N/A 03/14/2015   Procedure: CORONARY ARTERY BYPASS GRAFTING (CABG) x 4 (LIMA to LAD, SVG to DIAGONAL 2, SVG SEQUENTIALLY to OM1 and OM2);  Surgeon: [name], MD;  Location: [place];  Service: Open Heart Surgery;  Laterality: N/A;   INCISION AND DRAINAGE N/A 07/03/2016   Procedure: INCISION  AND DRAINAGE of CHEST ABSCESS;  Surgeon: [name], MD;  Location: [place];  Service: Thoracic;  Laterality: N/A;   JOINT REPLACEMENT     KNEE ARTHROSCOPY Left    MELANOMA EXCISION     "upper back"   PACEMAKER IMPLANT N/A 06/02/2016   Procedure: Pacemaker Implant;  Surgeon: Deboraha Sprang, MD;  Location: [place] CV LAB;  Service: Cardiovascular;  Laterality: N/A;   SHOULDER OPEN ROTATOR CUFF REPAIR Left    SKIN CANCER EXCISION Right    "cheek; real deep"   TEE WITHOUT CARDIOVERSION N/A 03/14/2015   Procedure: TRANSESOPHAGEAL ECHOCARDIOGRAM (TEE);  Surgeon: [name], MD;  Location: [place];  Service: Open Heart Surgery;  Laterality: N/A;   TEE WITHOUT CARDIOVERSION N/A 12/19/2015   Procedure: TRANSESOPHAGEAL ECHOCARDIOGRAM (TEE);  Surgeon: [name], MD;  Location: [place];  Service: Cardiovascular;  Laterality: N/A;   TEE WITHOUT CARDIOVERSION N/A 03/24/2016   Procedure: TRANSESOPHAGEAL ECHOCARDIOGRAM (TEE);  Surgeon: [name], MD;  Location: [facility] ENDOSCOPY;  Service: Cardiovascular;  Laterality: N/A;   TONSILLECTOMY     TOTAL KNEE ARTHROPLASTY Right      Current Outpatient Medications  Medication Sig Dispense Refill   atorvastatin (LIPITOR) 10 MG tablet TAKE 1 TABLET DAILY 90 tablet 3   b complex vitamins tablet Take 1 tablet by mouth daily.     ELIQUIS 5 MG TABS tablet TAKE 1 TABLET TWICE A DAY 180 tablet 1   HYDROcodone-acetaminophen (NORCO) 10-325 MG tablet Take 1 tablet by mouth daily as needed for severe pain. 90 tablet 0   losartan (COZAAR) 50 MG tablet Take 1 tablet (50 mg total) by mouth daily. 90 tablet 3   Multiple Vitamin (MULTIVITAMIN) tablet Take 1 tablet by mouth daily.     omega-3 acid ethyl esters (LOVAZA) 1 g capsule Take by mouth.     Omega-3 Fatty Acids (FISH OIL PO) Fish Oil     pantoprazole (PROTONIX) 40 MG tablet Take 1 tablet by mouth once daily 90 tablet 3   Psyllium (METAMUCIL PO) Take 15 mLs by mouth 3 (three) times  daily as needed (constipation).      tamsulosin (FLOMAX) 0.4 MG CAPS capsule Take 1 capsule (0.4 mg total) by mouth in the morning and at bedtime. 180 capsule 3   No current facility-administered medications for this visit.    Allergies:   Amiodarone hcl and Ace inhibitors    Social History:  The patient  reports that he has quit smoking. His smoking use included cigarettes. He has a 52.00 pack-year smoking history. He has never used smokeless tobacco. He reports that he does not drink alcohol and does not use drugs.   Family History:  The patient's family history includes Diabetes in his brother; Esophageal cancer in his father.    ROS:  Please see the history of present illness.   Otherwise, review  of systems are positive for balance is not normal.   All other systems are reviewed and negative.    PHYSICAL EXAM: VS:  BP (!) 162/78   Pulse 72   Ht 5' 10"$  (1.778 m)   Wt 177 lb 3.2 oz (80.4 kg)   SpO2 98%   BMI 25.43 kg/m  , BMI Body mass index is 25.43 kg/m. GEN: Well nourished, well developed, in no acute distress HEENT: normal Neck: no JVD, carotid bruits, or masses Cardiac: RRR; no murmurs, rubs, or gallops,no edema  Respiratory:  clear to auscultation bilaterally, normal work of breathing GI: soft, nontender, nondistended, + BS MS: no deformity or atrophy Skin: warm and dry, no rash Neuro:  Strength and sensation are intact Psych: euthymic mood, full affect; probably mild memory issue- some repeating of himself   EKG:   The ekg ordered Jan 2024 demonstrates V paced rhythm    Recent Labs: 03/04/2022: ALT 15; BUN 16; Creatinine, Ser 0.93; Hemoglobin 13.8; Platelets 230.0; Potassium 5.0; Sodium 134; TSH 0.90   Lipid Panel    Component Value Date/Time   CHOL 178 03/04/2022 1407   TRIG 65.0 03/04/2022 1407   HDL 59.20 03/04/2022 1407   CHOLHDL 3 03/04/2022 1407   VLDL 13.0 03/04/2022 1407   LDLCALC 106 (H) 03/04/2022 1407   LDLDIRECT 159.2 09/29/2006 1112      Other studies Reviewed: Additional studies/ records that were reviewed today with results demonstrating: labs reviewed.  LDL 106   ASSESSMENT AND PLAN:  CAD: Continue atorvastatin. No angina.  No NTG use.  AFib: Have considered lower dose ELiquis due to falls risk.  No recent falls. I asked him to use a walker or cane.  Family with him states that he refuses.   HTN: Blood pressure is well-controlled at home.  [id number] systolic. CHronic diastolic heart failure: Appears euvolemic.   Pacer: follows with EP.    Current medicines are reviewed at length with the patient today.  The patient concerns regarding his medicines were addressed.  The following changes have been made:  No change  Labs/ tests ordered today include:  No orders of the defined types were placed in this encounter.   Recommend 150 minutes/week of aerobic exercise Low fat, low carb, high fiber diet recommended  Disposition:   FU in 1 year   Signed, Larae Grooms, MD  03/26/2022 4:03 PM    [facility] [address], [place], [place]  [address] Phone: [phone]; Fax: [phone]

## 2022-03-26 ENCOUNTER — Ambulatory Visit: Payer: Medicare Other | Attending: Interventional Cardiology | Admitting: Interventional Cardiology

## 2022-03-26 ENCOUNTER — Encounter: Payer: Self-pay | Admitting: Interventional Cardiology

## 2022-03-26 VITALS — BP 162/78 | HR 72 | Ht 70.0 in | Wt 177.2 lb

## 2022-03-26 DIAGNOSIS — I2510 Atherosclerotic heart disease of native coronary artery without angina pectoris: Secondary | ICD-10-CM | POA: Diagnosis not present

## 2022-03-26 DIAGNOSIS — I5032 Chronic diastolic (congestive) heart failure: Secondary | ICD-10-CM | POA: Diagnosis not present

## 2022-03-26 DIAGNOSIS — I4819 Other persistent atrial fibrillation: Secondary | ICD-10-CM

## 2022-03-26 DIAGNOSIS — Z950 Presence of cardiac pacemaker: Secondary | ICD-10-CM

## 2022-03-26 DIAGNOSIS — I10 Essential (primary) hypertension: Secondary | ICD-10-CM

## 2022-03-26 MED ORDER — NITROGLYCERIN 0.4 MG SL SUBL: SUBLINGUAL_TABLET | SUBLINGUAL | 3 refills | Status: DC

## 2022-03-26 MED ORDER — NITROGLYCERIN 0.4 MG SL SUBL: SUBLINGUAL_TABLET | SUBLINGUAL | 3 refills | Status: AC

## 2022-03-26 NOTE — Patient Instructions (Signed)
Medication Instructions:  Your physician has recommended you make the following change in your medication:  1- Take 1 Nitroglycerin (NTG), under your tongue, while sitting.  If no relief of pain may repeat NTG, one tab every 5 minutes up to 3 tablets total over 15 minutes.  If no relief CALL 911.  If you have dizziness/lightheadness  while taking NTG, stop taking and call 911.    *If you need a refill on your cardiac medications before your next appointment, please call your pharmacy*  Lab Work: If you have labs (blood work) drawn today and your tests are completely normal, you will receive your results only by: [place] (if you have MyChart) OR A paper copy in the mail If you have any lab test that is abnormal or we need to change your treatment, we will call you to review the results.  Testing/Procedures: None ordered today.  Follow-Up: At [facility], you and your health needs are our priority.  As part of our continuing mission to provide you with exceptional heart care, we have created designated Provider Care Teams.  These Care Teams include your primary Cardiologist (physician) and Advanced Practice Providers (APPs -  Physician Assistants and Nurse Practitioners) who all work together to provide you with the care you need, when you need it.  We recommend signing up for the patient portal called "MyChart".  Sign up information is provided on this After Visit Summary.  MyChart is used to connect with patients for Virtual Visits (Telemedicine).  Patients are able to view lab/test results, encounter notes, upcoming appointments, etc.  Non-urgent messages can be sent to your provider as well.   To learn more about what you can do with MyChart, go to [email or web address].    Your next appointment:   1 year(s)  Provider:   Larae Grooms, MD

## 2022-04-09 NOTE — Progress Notes (Signed)
Remote pacemaker transmission.   

## 2022-04-10 ENCOUNTER — Other Ambulatory Visit: Payer: Self-pay | Admitting: Internal Medicine

## 2022-04-10 MED ORDER — HYDROCODONE-ACETAMINOPHEN 10-325 MG PO TABS: 1.0000 | ORAL_TABLET | Freq: Every day | ORAL | 0 refills | Status: DC | PRN

## 2022-04-17 ENCOUNTER — Telehealth: Payer: Self-pay | Admitting: Internal Medicine

## 2022-04-17 MED ORDER — HYDROCODONE-ACETAMINOPHEN 10-325 MG PO TABS: 1.0000 | ORAL_TABLET | Freq: Every day | ORAL | 0 refills | Status: DC | PRN

## 2022-04-17 NOTE — Telephone Encounter (Signed)
Patient's wife called and said HYDROcodone-acetaminophen ([place]) 10-325 MG tablet  was sent in for a 90 day supply, but insurance only covers a 30 day supply. They need it sent in to the [place] [place] again as a 30 day supply.  They said this happens often and would like for it to be noted that it should only be sent in as a 30 day supply.  Best callback is [phone].

## 2022-04-17 NOTE — Telephone Encounter (Signed)
Ok this is done 

## 2022-04-23 ENCOUNTER — Telehealth: Payer: Self-pay

## 2022-04-23 NOTE — Telephone Encounter (Signed)
The patient got an adjustment on his pacemaker in January. The patient wife states he has been tired ever since. She wanted to know if the adjustment has something to do with him being tired. He has had a transmission on 04/21/2022.They spoke with Benjamine Mola and they decided to leave the pacemaker programmed as is.

## 2022-05-19 ENCOUNTER — Other Ambulatory Visit: Payer: Self-pay | Admitting: Internal Medicine

## 2022-05-19 ENCOUNTER — Ambulatory Visit (INDEPENDENT_AMBULATORY_CARE_PROVIDER_SITE_OTHER): Payer: Medicare Other

## 2022-05-19 DIAGNOSIS — I4819 Other persistent atrial fibrillation: Secondary | ICD-10-CM | POA: Diagnosis not present

## 2022-05-19 MED ORDER — HYDROCODONE-ACETAMINOPHEN 10-325 MG PO TABS: 1.0000 | ORAL_TABLET | Freq: Every day | ORAL | 0 refills | Status: DC | PRN

## 2022-05-20 LAB — CUP PACEART REMOTE DEVICE CHECK
Battery Remaining Longevity: 80 mo
Battery Voltage: 2.98 V
Brady Statistic RV Percent Paced: 99.89 %
Date Time Interrogation Session: 20240415042525
Implantable Lead Connection Status: 753985
Implantable Lead Implant Date: 20180430
Implantable Lead Location: 753860
Implantable Lead Model: 5076
Implantable Pulse Generator Implant Date: 20180430
Lead Channel Impedance Value: 342 Ohm
Lead Channel Impedance Value: 551 Ohm
Lead Channel Pacing Threshold Amplitude: 0.75 V
Lead Channel Pacing Threshold Pulse Width: 0.4 ms
Lead Channel Sensing Intrinsic Amplitude: 11 mV
Lead Channel Sensing Intrinsic Amplitude: 6.5 mV
Lead Channel Setting Pacing Amplitude: 2.5 V
Lead Channel Setting Pacing Pulse Width: 0.4 ms
Lead Channel Setting Sensing Sensitivity: 1.2 mV
Zone Setting Status: 755011

## 2022-06-09 ENCOUNTER — Telehealth: Payer: Self-pay

## 2022-06-09 NOTE — Telephone Encounter (Signed)
The patient wife had question about a life device the patient received. I let her know she could let it hang from his belt loop or buckle. That way it will not be near his pacemaker.

## 2022-06-18 ENCOUNTER — Other Ambulatory Visit: Payer: Self-pay | Admitting: Internal Medicine

## 2022-06-18 MED ORDER — HYDROCODONE-ACETAMINOPHEN 10-325 MG PO TABS: 1.0000 | ORAL_TABLET | Freq: Every day | ORAL | 0 refills | Status: DC | PRN

## 2022-06-26 NOTE — Progress Notes (Signed)
Remote pacemaker transmission.   

## 2022-07-22 ENCOUNTER — Other Ambulatory Visit: Payer: Self-pay | Admitting: Internal Medicine

## 2022-07-22 MED ORDER — HYDROCODONE-ACETAMINOPHEN 10-325 MG PO TABS: 1.0000 | ORAL_TABLET | Freq: Every day | ORAL | 0 refills | Status: DC | PRN

## 2022-07-25 ENCOUNTER — Other Ambulatory Visit: Payer: Self-pay | Admitting: Internal Medicine

## 2022-07-25 DIAGNOSIS — I4891 Unspecified atrial fibrillation: Secondary | ICD-10-CM

## 2022-07-25 DIAGNOSIS — Z7901 Long term (current) use of anticoagulants: Secondary | ICD-10-CM

## 2022-07-25 NOTE — Telephone Encounter (Signed)
Prescription refill request for Eliquis received. Indication:AFIB Last office visit:2/24 Scr:0.93  1/24 Age: 89 Weight:80.4  KG  PRESCRIPTION REFILLED

## 2022-08-14 ENCOUNTER — Encounter: Payer: Self-pay | Admitting: Internal Medicine

## 2022-08-14 ENCOUNTER — Ambulatory Visit (INDEPENDENT_AMBULATORY_CARE_PROVIDER_SITE_OTHER): Payer: Medicare Other | Admitting: Internal Medicine

## 2022-08-14 VITALS — BP 124/72 | HR 75 | Temp 98.2°F | Ht 70.0 in | Wt 175.0 lb

## 2022-08-14 DIAGNOSIS — R739 Hyperglycemia, unspecified: Secondary | ICD-10-CM | POA: Diagnosis not present

## 2022-08-14 DIAGNOSIS — E782 Mixed hyperlipidemia: Secondary | ICD-10-CM | POA: Diagnosis not present

## 2022-08-14 DIAGNOSIS — M545 Low back pain, unspecified: Secondary | ICD-10-CM | POA: Diagnosis not present

## 2022-08-14 DIAGNOSIS — G8929 Other chronic pain: Secondary | ICD-10-CM

## 2022-08-14 DIAGNOSIS — R972 Elevated prostate specific antigen [PSA]: Secondary | ICD-10-CM | POA: Diagnosis not present

## 2022-08-14 DIAGNOSIS — F32A Depression, unspecified: Secondary | ICD-10-CM | POA: Diagnosis not present

## 2022-08-14 LAB — BASIC METABOLIC PANEL
BUN: 18 mg/dL (ref 6–23)
CO2: 26 mEq/L (ref 19–32)
Calcium: 9.5 mg/dL (ref 8.4–10.5)
Chloride: 99 mEq/L (ref 96–112)
Creatinine, Ser: 0.93 mg/dL (ref 0.40–1.50)
GFR: 71.28 mL/min (ref >60.00)
Glucose, Bld: 119 mg/dL — ABNORMAL HIGH (ref 70–99)
Potassium: 4.7 mEq/L (ref 3.5–5.1)
Sodium: 131 mEq/L — ABNORMAL LOW (ref 135–145)

## 2022-08-14 LAB — LIPID PANEL
Cholesterol: 157 mg/dL (ref 0–200)
HDL: 50.9 mg/dL (ref >39.00)
LDL Cholesterol: 93 mg/dL (ref 0–99)
NonHDL: 105.73
Total CHOL/HDL Ratio: 3
Triglycerides: 64 mg/dL (ref 0.0–149.0)
VLDL: 12.8 mg/dL (ref 0.0–40.0)

## 2022-08-14 LAB — HEPATIC FUNCTION PANEL
ALT: 13 U/L (ref 0–53)
AST: 20 U/L (ref 0–37)
Albumin: 4.1 g/dL (ref 3.5–5.2)
Alkaline Phosphatase: 93 U/L (ref 39–117)
Bilirubin, Direct: 0.2 mg/dL (ref 0.0–0.3)
Total Bilirubin: 1.1 mg/dL (ref 0.2–1.2)
Total Protein: 6.7 g/dL (ref 6.0–8.3)

## 2022-08-14 LAB — CBC WITH DIFFERENTIAL/PLATELET
Basophils Absolute: 0 10*3/uL (ref 0.0–0.1)
Basophils Relative: 0.2 % (ref 0.0–3.0)
Eosinophils Absolute: 0.1 10*3/uL (ref 0.0–0.7)
Eosinophils Relative: 1.2 % (ref 0.0–5.0)
HCT: 40.6 % (ref 39.0–52.0)
Hemoglobin: 13.5 g/dL (ref 13.0–17.0)
Lymphocytes Relative: 39 % (ref 12.0–46.0)
Lymphs Abs: 3.1 10*3/uL (ref 0.7–4.0)
MCHC: 33.1 g/dL (ref 30.0–36.0)
MCV: 97.8 fl (ref 78.0–100.0)
Monocytes Absolute: 0.7 10*3/uL (ref 0.1–1.0)
Monocytes Relative: 8.4 % (ref 3.0–12.0)
Neutro Abs: 4 10*3/uL (ref 1.4–7.7)
Neutrophils Relative %: 51.2 % (ref 43.0–77.0)
Platelets: 223 10*3/uL (ref 150.0–400.0)
RBC: 4.16 Mil/uL — ABNORMAL LOW (ref 4.22–5.81)
RDW: 13.4 % (ref 11.5–15.5)
WBC: 7.8 10*3/uL (ref 4.0–10.5)

## 2022-08-14 LAB — TSH: TSH: 0.81 u[IU]/mL (ref 0.35–5.50)

## 2022-08-14 LAB — HEMOGLOBIN A1C: Hgb A1c MFr Bld: 5.7 % (ref 4.6–6.5)

## 2022-08-14 MED ORDER — CITALOPRAM HYDROBROMIDE 10 MG PO TABS: 10.0000 mg | ORAL_TABLET | Freq: Every day | ORAL | 3 refills | Status: DC

## 2022-08-14 NOTE — Assessment & Plan Note (Signed)
Followed per urology, considering MRI prostate, for f/u lab at 4 mo for now

## 2022-08-14 NOTE — Patient Instructions (Signed)
Please take all new medication as prescribed - the celexa 10 mg per day ° °Please continue all other medications as before, and refills have been done if requested. ° °Please have the pharmacy call with any other refills you may need. ° °Please continue your efforts at being more active, low cholesterol diet, and weight control. ° °You are otherwise up to date with prevention measures today. ° °Please keep your appointments with your specialists as you may have planned ° °Please go to the LAB at the blood drawing area for the tests to be done ° °You will be contacted by phone if any changes need to be made immediately.  Otherwise, you will receive a letter about your results with an explanation, but please check with MyChart first. ° °Please remember to sign up for MyChart if you have not done so, as this will be important to you in the future with finding out test results, communicating by private email, and scheduling acute appointments online when needed. ° °Please make an Appointment to return in 6 months, or sooner if needed °

## 2022-08-14 NOTE — Progress Notes (Signed)
Patient ID: BARETT WHIDBEE, male   DOB: [birth year], 89 y.o.   MRN: [id number]        Chief Complaint: follow up fatigue, anxiety depression, chronic pain, hyperglycemia, hld       HPI:  Jonathan Orozco is a 89 y.o. male here with increased anxiety depression with fatigue and low mood, wt loss several lbs.  Pt denies chest pain, increased sob or doe, wheezing, orthopnea, PND, increased LE swelling, palpitations, dizziness or syncope.   Pt denies polydipsia, polyuria, or new focal neuro s/s.    Pt denies fever, wt loss, night sweats, loss of appetite, or other constitutional symptoms    Chronic pain stable on current hydrocodone prn      Wt Readings from Last 3 Encounters:  08/14/22 175 lb (79.4 kg)  03/26/22 177 lb 3.2 oz (80.4 kg)  03/04/22 179 lb (81.2 kg)   BP Readings from Last 3 Encounters:  08/14/22 124/72  03/26/22 (!) 162/78  03/04/22 124/66         Past Medical History:  Diagnosis Date   ABUSE, ALCOHOL, IN REMISSION 04/08/2007   ALLERGIC RHINITIS 09/29/2006   Arthritis    "hands" (07/02/2016)   Atrial fibrillation ([facility])    BENIGN PROSTATIC HYPERTROPHY 09/29/2006   BPPV (benign paroxysmal positional vertigo) 04/08/2007   CAD (coronary artery disease)    a. LHC 2/17: EF 55-65%, LM 75, pLAD 75, oD1 75, oD2 65, D3 85, oLCx 99, oOM1 75, pRCA 25 >> CABG   Carotid stenosis    a. 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   Diverticulosis    ERECTILE DYSFUNCTION 09/29/2006   GERD (gastroesophageal reflux disease)    GLUCOSE INTOLERANCE 03/19/2010   HYPERLIPIDEMIA 09/29/2006   HYPERTENSION 09/29/2006   Long term (current) use of anticoagulants 04/26/2010   MELANOMA, MALIGNANT, SKIN NOS 09/29/2006   other skin cancers -no further melanoma   OSTEOARTHROSIS NOS, OTHER [facility] SITE 09/29/2006   Other specified forms of hearing loss 08/10/2009   Pneumonia ~ 1969   Presence of  permanent cardiac pacemaker 06/02/2016   VENTRICULAR TACHYCARDIA 09/29/2006   Past Surgical History:  Procedure Laterality Date   AV NODE ABLATION N/A 07/02/2016   Procedure: AV Node Ablation;  Surgeon: Duke Salvia, MD;  Location: [facility] INVASIVE CV LAB;  Service: Cardiovascular;  Laterality: N/A;   CARDIAC CATHETERIZATION N/A 03/12/2015   Procedure: Left Heart Cath and Coronary Angiography;  Surgeon: Lyn Records, MD;  Location: [facility] INVASIVE CV LAB;  Service: Cardiovascular;  Laterality: N/A;   CARDIOVERSION N/A 12/19/2015   Procedure: CARDIOVERSION;  Surgeon: Jake Bathe, MD;  Location: [facility] ENDOSCOPY;  Service: Cardiovascular;  Laterality: N/A;   CARDIOVERSION N/A 03/24/2016   Procedure: CARDIOVERSION;  Surgeon: Lars Masson, MD;  Location: [facility] ENDOSCOPY;  Service: Cardiovascular;  Laterality: N/A;   CATARACT EXTRACTION W/ INTRAOCULAR LENS  IMPLANT, BILATERAL Bilateral    CLIPPING OF ATRIAL APPENDAGE N/A 03/14/2015   Procedure: CLIPPING OF ATRIAL APPENDAGE;  Surgeon: Loreli Slot, MD;  Location: [facility] OR;  Service: Open Heart Surgery;  Laterality: N/A;   COLONOSCOPY WITH PROPOFOL N/A 12/08/2014   Procedure: COLONOSCOPY WITH PROPOFOL;  Surgeon: Jeani Hawking, MD;  Location: [facility] ENDOSCOPY;  Service: Endoscopy;  Laterality: N/A;   CORONARY ARTERY BYPASS GRAFT N/A 03/14/2015   Procedure: CORONARY ARTERY BYPASS GRAFTING (CABG) x 4 (LIMA to LAD, SVG  to DIAGONAL 2, SVG SEQUENTIALLY to OM1 and OM2);  Surgeon: Loreli Slot, MD;  Location: [facility] OR;  Service: Open Heart Surgery;  Laterality: N/A;   INCISION AND DRAINAGE N/A 07/03/2016   Procedure: INCISION AND DRAINAGE of CHEST ABSCESS;  Surgeon: Loreli Slot, MD;  Location: [facility] OR;  Service: Thoracic;  Laterality: N/A;   JOINT REPLACEMENT     KNEE ARTHROSCOPY Left    MELANOMA EXCISION     "upper back"   PACEMAKER IMPLANT N/A 06/02/2016   Procedure: Pacemaker Implant;  Surgeon: Duke Salvia, MD;  Location: [facility] INVASIVE CV  LAB;  Service: Cardiovascular;  Laterality: N/A;   SHOULDER OPEN ROTATOR CUFF REPAIR Left    SKIN CANCER EXCISION Right    "cheek; real deep"   TEE WITHOUT CARDIOVERSION N/A 03/14/2015   Procedure: TRANSESOPHAGEAL ECHOCARDIOGRAM (TEE);  Surgeon: Loreli Slot, MD;  Location: [facility] OR;  Service: Open Heart Surgery;  Laterality: N/A;   TEE WITHOUT CARDIOVERSION N/A 12/19/2015   Procedure: TRANSESOPHAGEAL ECHOCARDIOGRAM (TEE);  Surgeon: Jake Bathe, MD;  Location: [facility] ENDOSCOPY;  Service: Cardiovascular;  Laterality: N/A;   TEE WITHOUT CARDIOVERSION N/A 03/24/2016   Procedure: TRANSESOPHAGEAL ECHOCARDIOGRAM (TEE);  Surgeon: Lars Masson, MD;  Location: [facility] ENDOSCOPY;  Service: Cardiovascular;  Laterality: N/A;   TONSILLECTOMY     TOTAL KNEE ARTHROPLASTY Right     reports that he has quit smoking. His smoking use included cigarettes. He has a 52 pack-year smoking history. He has never used smokeless tobacco. He reports that he does not drink alcohol and does not use drugs. family history includes Diabetes in his brother; Esophageal cancer in his father. Allergies  Allergen Reactions   Amiodarone Hcl Other (See Comments)    Patient reports photosensitivity   Ace Inhibitors Cough   Current Outpatient Medications on File Prior to Visit  Medication Sig Dispense Refill   atorvastatin (LIPITOR) 10 MG tablet TAKE 1 TABLET DAILY 90 tablet 3   b complex vitamins tablet Take 1 tablet by mouth daily.     ELIQUIS 5 MG TABS tablet TAKE 1 TABLET TWICE A DAY 180 tablet 1   HYDROcodone-acetaminophen (NORCO) 10-325 MG tablet Take 1 tablet by mouth daily as needed for severe pain. 30 tablet 0   losartan (COZAAR) 50 MG tablet Take 1 tablet (50 mg total) by mouth daily. 90 tablet 3   Multiple Vitamin (MULTIVITAMIN) tablet Take 1 tablet by mouth daily.     nitroGLYCERIN (NITROSTAT) 0.4 MG SL tablet Take 1 tablet under your tongue, while sitting.  If no relief of pain may repeat NTG, one tab every 5  minutes up to 3 tablets total over 15 minutes.  If no relief CALL 911. 25 tablet 3   omega-3 acid ethyl esters (LOVAZA) 1 g capsule Take by mouth.     Omega-3 Fatty Acids (FISH OIL PO) Fish Oil     pantoprazole (PROTONIX) 40 MG tablet Take 1 tablet by mouth once daily 90 tablet 3   Psyllium (METAMUCIL PO) Take 15 mLs by mouth 3 (three) times daily as needed (constipation).      tamsulosin (FLOMAX) 0.4 MG CAPS capsule Take 1 capsule (0.4 mg total) by mouth in the morning and at bedtime. 180 capsule 3   No current facility-administered medications on file prior to visit.        ROS:  All others reviewed and negative.  Objective        PE:  BP 124/72 (BP Location: Right Arm, Patient  Position: Sitting, Cuff Size: Normal)   Pulse 75   Temp 98.2 F (36.8 C) (Oral)   Ht 5\' 10"  (1.778 m)   Wt 175 lb (79.4 kg)   SpO2 98%   BMI 25.11 kg/m                 Constitutional: Pt appears in NAD               HENT: Head: NCAT.                Right Ear: External ear normal.                 Left Ear: External ear normal.                Eyes: . Pupils are equal, round, and reactive to light. Conjunctivae and EOM are normal               Nose: without d/c or deformity               Neck: Neck supple. Gross normal ROM               Cardiovascular: Normal rate and regular rhythm.                 Pulmonary/Chest: Effort normal and breath sounds without rales or wheezing.                Abd:  Soft, NT, ND, + BS, no organomegaly               Neurological: Pt is alert. At baseline orientation, motor grossly intact               Skin: Skin is warm. No rashes, no other new lesions, LE edema - none               Psychiatric: Pt behavior is normal without agitation   Micro: none  Cardiac tracings I have personally interpreted today:  none  Pertinent Radiological findings (summarize): none   Lab Results  Component Value Date   WBC 7.8 08/14/2022   HGB 13.5 08/14/2022   HCT 40.6 08/14/2022   PLT 223.0  08/14/2022   GLUCOSE 119 (H) 08/14/2022   CHOL 157 08/14/2022   TRIG 64.0 08/14/2022   HDL 50.90 08/14/2022   LDLDIRECT 159.2 09/29/2006   LDLCALC 93 08/14/2022   ALT 13 08/14/2022   AST 20 08/14/2022   NA 131 (L) 08/14/2022   K 4.7 08/14/2022   CL 99 08/14/2022   CREATININE 0.93 08/14/2022   BUN 18 08/14/2022   CO2 26 08/14/2022   TSH 0.81 08/14/2022   PSA 15.86 (H) 03/04/2022   INR 3.1 (A) 06/01/2018   HGBA1C 5.7 08/14/2022   Assessment/Plan:  ODES LOLLI is a 89 y.o. White or Caucasian [1] male with  has a past medical history of ABUSE, ALCOHOL, IN REMISSION (04/08/2007), ALLERGIC RHINITIS (09/29/2006), Arthritis, Atrial fibrillation (HCC), BENIGN PROSTATIC HYPERTROPHY (09/29/2006), BPPV (benign paroxysmal positional vertigo) (04/08/2007), CAD (coronary artery disease), Carotid stenosis, Chronic lower back pain, COLONIC POLYPS, HX OF (06/23/2007), DISORDERS, ORGANIC INSOMNIA NOS (09/29/2006), Diverticulosis, ERECTILE DYSFUNCTION (09/29/2006), GERD (gastroesophageal reflux disease), GLUCOSE INTOLERANCE (03/19/2010), HYPERLIPIDEMIA (09/29/2006), HYPERTENSION (09/29/2006), Long term (current) use of anticoagulants (04/26/2010), MELANOMA, MALIGNANT, SKIN NOS (09/29/2006), OSTEOARTHROSIS NOS, OTHER [facility] SITE (09/29/2006), Other specified forms of hearing loss (08/10/2009), Pneumonia (~ 1969), Presence of permanent cardiac pacemaker (06/02/2016), and VENTRICULAR TACHYCARDIA (09/29/2006).  Elevated PSA Followed per urology, considering  MRI prostate, for f/u lab at 4 mo for now  Hyperglycemia Lab Results  Component Value Date   HGBA1C 5.7 08/14/2022   Stable, pt to continue current medical treatment   - diet, wt control   Hyperlipidemia Lab Results  Component Value Date   LDLCALC 93 08/14/2022   Uncontrolled,, pt to continue current statin   10 mg lipitor as declines change for now  Depression Mild to mod, no SI or Hi, for celexa 10 every day,  to f/u any worsening symptoms or  concerns  Chronic low back pain Stable overall, cont hydrocodone prn  Followup: Return in about 6 months (around 02/14/2023).  Oliver Barre, MD 08/16/2022 8:12 PM  Medical Group [facility] - [facility] Medicine

## 2022-08-15 LAB — URINALYSIS, ROUTINE W REFLEX MICROSCOPIC
Bilirubin Urine: NEGATIVE
Hgb urine dipstick: NEGATIVE
Ketones, ur: NEGATIVE
Leukocytes,Ua: NEGATIVE
Nitrite: NEGATIVE
RBC / HPF: NONE SEEN (ref 0–?)
Specific Gravity, Urine: 1.015 (ref 1.000–1.030)
Total Protein, Urine: NEGATIVE
Urine Glucose: NEGATIVE
Urobilinogen, UA: 0.2 (ref 0.0–1.0)
pH: 6 (ref 5.0–8.0)

## 2022-08-15 NOTE — Progress Notes (Signed)
The test results show that your current treatment is OK, as the tests are stable.  Please continue the same plan.  There is no other need for change of treatment or further evaluation based on these results, at this time.  thanks 

## 2022-08-16 ENCOUNTER — Encounter: Payer: Self-pay | Admitting: Internal Medicine

## 2022-08-16 DIAGNOSIS — F32A Depression, unspecified: Secondary | ICD-10-CM | POA: Insufficient documentation

## 2022-08-16 NOTE — Assessment & Plan Note (Signed)
Stable overall, cont hydrocodone prn

## 2022-08-16 NOTE — Assessment & Plan Note (Signed)
Lab Results  Component Value Date   HGBA1C 5.7 08/14/2022   Stable, pt to continue current medical treatment   - diet, wt control

## 2022-08-16 NOTE — Assessment & Plan Note (Signed)
Mild to mod, no SI or Hi, for celexa 10 every day,  to f/u any worsening symptoms or concerns

## 2022-08-16 NOTE — Assessment & Plan Note (Signed)
Lab Results  Component Value Date   LDLCALC 93 08/14/2022   Uncontrolled,, pt to continue current statin   10 mg lipitor as declines change for now

## 2022-08-18 ENCOUNTER — Ambulatory Visit (INDEPENDENT_AMBULATORY_CARE_PROVIDER_SITE_OTHER): Payer: Medicare Other

## 2022-08-18 DIAGNOSIS — I4819 Other persistent atrial fibrillation: Secondary | ICD-10-CM | POA: Diagnosis not present

## 2022-08-19 LAB — CUP PACEART REMOTE DEVICE CHECK
Battery Remaining Longevity: 76 mo
Battery Voltage: 2.97 V
Brady Statistic RV Percent Paced: 99.94 %
Date Time Interrogation Session: 20240715095015
Implantable Lead Connection Status: 753985
Implantable Lead Implant Date: 20180430
Implantable Lead Location: 753860
Implantable Lead Model: 5076
Implantable Pulse Generator Implant Date: 20180430
Lead Channel Impedance Value: 361 Ohm
Lead Channel Impedance Value: 551 Ohm
Lead Channel Pacing Threshold Amplitude: 0.625 V
Lead Channel Pacing Threshold Pulse Width: 0.4 ms
Lead Channel Sensing Intrinsic Amplitude: 11 mV
Lead Channel Sensing Intrinsic Amplitude: 6.5 mV
Lead Channel Setting Pacing Amplitude: 2.5 V
Lead Channel Setting Pacing Pulse Width: 0.4 ms
Lead Channel Setting Sensing Sensitivity: 1.2 mV
Zone Setting Status: 755011

## 2022-08-21 ENCOUNTER — Other Ambulatory Visit: Payer: Self-pay | Admitting: Internal Medicine

## 2022-08-21 MED ORDER — HYDROCODONE-ACETAMINOPHEN 10-325 MG PO TABS: 1.0000 | ORAL_TABLET | Freq: Every day | ORAL | 0 refills | Status: DC | PRN

## 2022-09-03 NOTE — Progress Notes (Signed)
Remote pacemaker transmission.   

## 2022-09-22 ENCOUNTER — Other Ambulatory Visit: Payer: Self-pay | Admitting: Internal Medicine

## 2022-09-22 MED ORDER — HYDROCODONE-ACETAMINOPHEN 10-325 MG PO TABS: 1.0000 | ORAL_TABLET | Freq: Every day | ORAL | 0 refills | Status: DC | PRN

## 2022-10-23 ENCOUNTER — Other Ambulatory Visit: Payer: Self-pay | Admitting: Internal Medicine

## 2022-10-23 MED ORDER — HYDROCODONE-ACETAMINOPHEN 10-325 MG PO TABS: 1.0000 | ORAL_TABLET | Freq: Every day | ORAL | 0 refills | Status: DC | PRN

## 2022-11-11 ENCOUNTER — Ambulatory Visit (INDEPENDENT_AMBULATORY_CARE_PROVIDER_SITE_OTHER): Payer: Medicare Other | Admitting: Internal Medicine

## 2022-11-11 ENCOUNTER — Encounter: Payer: Self-pay | Admitting: Internal Medicine

## 2022-11-11 VITALS — BP 122/64 | HR 75 | Temp 97.7°F | Ht 70.0 in | Wt 177.0 lb

## 2022-11-11 DIAGNOSIS — I10 Essential (primary) hypertension: Secondary | ICD-10-CM

## 2022-11-11 DIAGNOSIS — F411 Generalized anxiety disorder: Secondary | ICD-10-CM

## 2022-11-11 DIAGNOSIS — R972 Elevated prostate specific antigen [PSA]: Secondary | ICD-10-CM

## 2022-11-11 DIAGNOSIS — E782 Mixed hyperlipidemia: Secondary | ICD-10-CM | POA: Diagnosis not present

## 2022-11-11 DIAGNOSIS — R739 Hyperglycemia, unspecified: Secondary | ICD-10-CM | POA: Diagnosis not present

## 2022-11-11 MED ORDER — CITALOPRAM HYDROBROMIDE 10 MG PO TABS: 10.0000 mg | ORAL_TABLET | Freq: Every day | ORAL | 3 refills | Status: DC

## 2022-11-11 MED ORDER — REPATHA SURECLICK 140 MG/ML ~~LOC~~ SOAJ: 140.0000 mg | SUBCUTANEOUS | 3 refills | Status: DC

## 2022-11-11 NOTE — Progress Notes (Unsigned)
Patient ID: Jonathan Orozco, male   DOB: 1928-04-29, 89 y.o.   MRN: [id number]        Chief Complaint: follow up hld, elevated psa, anxiety, htn, hyperglycemia       HPI:  Jonathan Orozco is a 89 y.o. male here overall ok but with increased anxiety and difficult to live with per wife, Denies worsening depressive symptoms, suicidal ideation, or panic; has ongoing anxiety worsening,  Pt willing to take repatha but declines statin restart.  Denies urinary symptoms such as dysuria, frequency, urgency, flank pain, hematuria or n/v, fever, chills, but did have recent elevated psa and has seen urology with planned f/u soon.   Pt denies polydipsia, polyuria, or new focal neuro s/s.    Pt denies fever, wt loss, night sweats, loss of appetite, or other constitutional symptoms         Wt Readings from Last 3 Encounters:  11/11/22 177 lb (80.3 kg)  08/14/22 175 lb (79.4 kg)  03/26/22 177 lb 3.2 oz (80.4 kg)   BP Readings from Last 3 Encounters:  11/11/22 122/64  08/14/22 124/72  03/26/22 (!) 162/78         Past Medical History:  Diagnosis Date   ABUSE, ALCOHOL, IN REMISSION 04/08/2007   ALLERGIC RHINITIS 09/29/2006   Arthritis    "hands" (07/02/2016)   Atrial fibrillation (HCC)    BENIGN PROSTATIC HYPERTROPHY 09/29/2006   BPPV (benign paroxysmal positional vertigo) 04/08/2007   CAD (coronary artery disease)    a. LHC 2/17: EF 55-65%, LM 75, pLAD 75, oD1 75, oD2 65, D3 85, oLCx 99, oOM1 75, pRCA 25 >> CABG   Carotid stenosis    a. 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   Diverticulosis    ERECTILE DYSFUNCTION 09/29/2006   GERD (gastroesophageal reflux disease)    GLUCOSE INTOLERANCE 03/19/2010   HYPERLIPIDEMIA 09/29/2006   HYPERTENSION 09/29/2006   Long term (current) use of anticoagulants 04/26/2010   MELANOMA, MALIGNANT, SKIN NOS 09/29/2006   other skin cancers -no further melanoma    OSTEOARTHROSIS NOS, OTHER [facility] SITE 09/29/2006   Other specified forms of hearing loss 08/10/2009   Pneumonia ~ 1969   Presence of permanent cardiac pacemaker 06/02/2016   VENTRICULAR TACHYCARDIA 09/29/2006   Past Surgical History:  Procedure Laterality Date   AV NODE ABLATION N/A 07/02/2016   Procedure: AV Node Ablation;  Surgeon: Duke Salvia, MD;  Location: [facility] INVASIVE CV LAB;  Service: Cardiovascular;  Laterality: N/A;   CARDIAC CATHETERIZATION N/A 03/12/2015   Procedure: Left Heart Cath and Coronary Angiography;  Surgeon: Lyn Records, MD;  Location: [facility] INVASIVE CV LAB;  Service: Cardiovascular;  Laterality: N/A;   CARDIOVERSION N/A 12/19/2015   Procedure: CARDIOVERSION;  Surgeon: Jake Bathe, MD;  Location: [facility] ENDOSCOPY;  Service: Cardiovascular;  Laterality: N/A;   CARDIOVERSION N/A 03/24/2016   Procedure: CARDIOVERSION;  Surgeon: Lars Masson, MD;  Location: [facility] ENDOSCOPY;  Service: Cardiovascular;  Laterality: N/A;   CATARACT EXTRACTION W/ INTRAOCULAR LENS  IMPLANT, BILATERAL Bilateral    CLIPPING OF ATRIAL APPENDAGE N/A 03/14/2015   Procedure: CLIPPING OF ATRIAL APPENDAGE;  Surgeon: Loreli Slot, MD;  Location: [facility] OR;  Service: Open Heart Surgery;  Laterality: N/A;   COLONOSCOPY WITH PROPOFOL N/A 12/08/2014   Procedure: COLONOSCOPY WITH PROPOFOL;  Surgeon: Jeani Hawking, MD;  Location: [facility] ENDOSCOPY;  Service: Endoscopy;  Laterality: N/A;   CORONARY ARTERY BYPASS GRAFT N/A 03/14/2015   Procedure: CORONARY ARTERY BYPASS GRAFTING (CABG) x 4 (LIMA to LAD, SVG to DIAGONAL 2, SVG SEQUENTIALLY to OM1 and OM2);  Surgeon: Loreli Slot, MD;  Location: [facility] OR;  Service: Open Heart Surgery;  Laterality: N/A;   INCISION AND DRAINAGE N/A 07/03/2016   Procedure: INCISION AND DRAINAGE of CHEST ABSCESS;  Surgeon: Loreli Slot, MD;  Location: [facility] OR;  Service: Thoracic;  Laterality: N/A;   JOINT REPLACEMENT     KNEE ARTHROSCOPY Left    MELANOMA EXCISION     "upper  back"   PACEMAKER IMPLANT N/A 06/02/2016   Procedure: Pacemaker Implant;  Surgeon: Duke Salvia, MD;  Location: [facility] INVASIVE CV LAB;  Service: Cardiovascular;  Laterality: N/A;   SHOULDER OPEN ROTATOR CUFF REPAIR Left    SKIN CANCER EXCISION Right    "cheek; real deep"   TEE WITHOUT CARDIOVERSION N/A 03/14/2015   Procedure: TRANSESOPHAGEAL ECHOCARDIOGRAM (TEE);  Surgeon: Loreli Slot, MD;  Location: [facility] OR;  Service: Open Heart Surgery;  Laterality: N/A;   TEE WITHOUT CARDIOVERSION N/A 12/19/2015   Procedure: TRANSESOPHAGEAL ECHOCARDIOGRAM (TEE);  Surgeon: Jake Bathe, MD;  Location: [facility] ENDOSCOPY;  Service: Cardiovascular;  Laterality: N/A;   TEE WITHOUT CARDIOVERSION N/A 03/24/2016   Procedure: TRANSESOPHAGEAL ECHOCARDIOGRAM (TEE);  Surgeon: Lars Masson, MD;  Location: [facility] ENDOSCOPY;  Service: Cardiovascular;  Laterality: N/A;   TONSILLECTOMY     TOTAL KNEE ARTHROPLASTY Right     reports that he has quit smoking. His smoking use included cigarettes. He has a 52 pack-year smoking history. He has never used smokeless tobacco. He reports that he does not drink alcohol and does not use drugs. family history includes Diabetes in his brother; Esophageal cancer in his father. Allergies  Allergen Reactions   Amiodarone Hcl Other (See Comments)    Patient reports photosensitivity   Ace Inhibitors Cough   Current Outpatient Medications on File Prior to Visit  Medication Sig Dispense Refill   b complex vitamins tablet Take 1 tablet by mouth daily.     ELIQUIS 5 MG TABS tablet TAKE 1 TABLET TWICE A DAY 180 tablet 1   HYDROcodone-acetaminophen (NORCO) 10-325 MG tablet Take 1 tablet by mouth daily as needed for severe pain. 30 tablet 0   losartan (COZAAR) 50 MG tablet Take 1 tablet (50 mg total) by mouth daily. 90 tablet 3   Multiple Vitamin (MULTIVITAMIN) tablet Take 1 tablet by mouth daily.     nitroGLYCERIN (NITROSTAT) 0.4 MG SL tablet Take 1 tablet under your tongue, while  sitting.  If no relief of pain may repeat NTG, one tab every 5 minutes up to 3 tablets total over 15 minutes.  If no relief CALL 911. 25 tablet 3   omega-3 acid ethyl esters (LOVAZA) 1 g capsule Take by mouth.     Omega-3 Fatty Acids (FISH OIL PO) Fish Oil     pantoprazole (PROTONIX) 40 MG tablet Take 1 tablet by mouth once daily 90 tablet 3   Psyllium (METAMUCIL PO) Take 15 mLs by mouth 3 (three) times daily as needed (constipation).      tamsulosin (FLOMAX) 0.4 MG CAPS capsule Take 1 capsule (0.4 mg total) by mouth in the morning and at bedtime. 180 capsule 3   No current facility-administered medications on file prior to visit.        ROS:  All others reviewed and negative.  Objective  PE:  BP 122/64 (BP Location: Right Arm, Patient Position: Sitting, Cuff Size: Normal)   Pulse 75   Temp 97.7 F (36.5 C) (Oral)   Ht 5\' 10"  (1.778 m)   Wt 177 lb (80.3 kg)   SpO2 100%   BMI 25.40 kg/m                 Constitutional: Pt appears in NAD               HENT: Head: NCAT.                Right Ear: External ear normal.                 Left Ear: External ear normal.                Eyes: . Pupils are equal, round, and reactive to light. Conjunctivae and EOM are normal               Nose: without d/c or deformity               Neck: Neck supple. Gross normal ROM               Cardiovascular: Normal rate and regular rhythm.                 Pulmonary/Chest: Effort normal and breath sounds without rales or wheezing.                Abd:  Soft, NT, ND, + BS, no organomegaly               Neurological: Pt is alert. At baseline orientation, motor grossly intact               Skin: Skin is warm. No rashes, no other new lesions, LE edema - none               Psychiatric: Pt behavior is normal without agitation , mod anxiety  Micro: none  Cardiac tracings I have personally interpreted today:  none  Pertinent Radiological findings (summarize): none   Lab Results  Component Value Date    WBC 7.8 08/14/2022   HGB 13.5 08/14/2022   HCT 40.6 08/14/2022   PLT 223.0 08/14/2022   GLUCOSE 119 (H) 08/14/2022   CHOL 157 08/14/2022   TRIG 64.0 08/14/2022   HDL 50.90 08/14/2022   LDLDIRECT 159.2 09/29/2006   LDLCALC 93 08/14/2022   ALT 13 08/14/2022   AST 20 08/14/2022   NA 131 (L) 08/14/2022   K 4.7 08/14/2022   CL 99 08/14/2022   CREATININE 0.93 08/14/2022   BUN 18 08/14/2022   CO2 26 08/14/2022   TSH 0.81 08/14/2022   PSA 15.86 (H) 03/04/2022   INR 3.1 (A) 06/01/2018   HGBA1C 5.7 08/14/2022   Assessment/Plan:  KINNIE LAMBERTI is a 89 y.o. White or Caucasian [1] male with  has a past medical history of ABUSE, ALCOHOL, IN REMISSION (04/08/2007), ALLERGIC RHINITIS (09/29/2006), Arthritis, Atrial fibrillation (HCC), BENIGN PROSTATIC HYPERTROPHY (09/29/2006), BPPV (benign paroxysmal positional vertigo) (04/08/2007), CAD (coronary artery disease), Carotid stenosis, Chronic lower back pain, COLONIC POLYPS, HX OF (06/23/2007), DISORDERS, ORGANIC INSOMNIA NOS (09/29/2006), Diverticulosis, ERECTILE DYSFUNCTION (09/29/2006), GERD (gastroesophageal reflux disease), GLUCOSE INTOLERANCE (03/19/2010), HYPERLIPIDEMIA (09/29/2006), HYPERTENSION (09/29/2006), Long term (current) use of anticoagulants (04/26/2010), MELANOMA, MALIGNANT, SKIN NOS (09/29/2006), OSTEOARTHROSIS NOS, OTHER SPEC SITE (09/29/2006), Other specified forms of hearing loss (08/10/2009), Pneumonia (~ 1969), Presence of permanent cardiac pacemaker (06/02/2016),  and VENTRICULAR TACHYCARDIA (09/29/2006).  Hyperlipidemia Lab Results  Component Value Date   LDLCALC 93 08/14/2022   Uncontrolled, pt to start repatha 140 q 2 wks   Anxiety state Mild to mod, for citalopram 10 every day,,  to f/u any worsening symptoms or concerns   Hyperglycemia Lab Results  Component Value Date   HGBA1C 5.7 08/14/2022   Stable, pt to continue current medical treatment  - diet, wt control   Hypertension, uncontrolled BP Readings from  Last 3 Encounters:  11/11/22 122/64  08/14/22 124/72  03/26/22 (!) 162/78   Stable, pt to continue medical treatment losartan 50 qd   PSA elevation Asympt, pt has f/u urology oct 29  Followup: Return in about 4 months (around 03/14/2023).  Oliver Barre, MD 11/13/2022 9:12 PM [facility] [place] [facility] - [facility] Internal Medicine

## 2022-11-11 NOTE — Patient Instructions (Signed)
Please take all new medication as prescribed - the repatha for cholesterol, and citalopram 10 mg for nerves  Please continue all other medications as before, and refills have been done if requested.  Please have the pharmacy call with any other refills you may need.  Please continue your efforts at being more active, low cholesterol diet, and weight control.  Please keep your appointments with your specialists as you may have planned  Please make an Appointment to return in 4 months, or sooner if needed

## 2022-11-13 ENCOUNTER — Encounter: Payer: Self-pay | Admitting: Internal Medicine

## 2022-11-13 NOTE — Assessment & Plan Note (Signed)
Lab Results  Component Value Date   LDLCALC 93 08/14/2022   Uncontrolled, pt to start repatha 140 q 2 wks

## 2022-11-13 NOTE — Assessment & Plan Note (Signed)
Mild to mod, for citalopram 10 every day,,  to f/u any worsening symptoms or concerns

## 2022-11-13 NOTE — Assessment & Plan Note (Signed)
Lab Results  Component Value Date   HGBA1C 5.7 08/14/2022   Stable, pt to continue current medical treatment   - diet, wt control

## 2022-11-13 NOTE — Assessment & Plan Note (Signed)
BP Readings from Last 3 Encounters:  11/11/22 122/64  08/14/22 124/72  03/26/22 (!) 162/78   Stable, pt to continue medical treatment losartan 50 qd

## 2022-11-13 NOTE — Assessment & Plan Note (Signed)
Asympt, pt has f/u urology oct 29

## 2022-11-17 ENCOUNTER — Ambulatory Visit (INDEPENDENT_AMBULATORY_CARE_PROVIDER_SITE_OTHER): Payer: Medicare Other

## 2022-11-17 DIAGNOSIS — I4819 Other persistent atrial fibrillation: Secondary | ICD-10-CM | POA: Diagnosis not present

## 2022-11-19 LAB — CUP PACEART REMOTE DEVICE CHECK
Battery Remaining Longevity: 73 mo
Battery Voltage: 2.97 V
Brady Statistic RV Percent Paced: 99.92 %
Date Time Interrogation Session: 20241014031426
Implantable Lead Connection Status: 753985
Implantable Lead Implant Date: 20180430
Implantable Lead Location: 753860
Implantable Lead Model: 5076
Implantable Pulse Generator Implant Date: 20180430
Lead Channel Impedance Value: 380 Ohm
Lead Channel Impedance Value: 570 Ohm
Lead Channel Pacing Threshold Amplitude: 0.75 V
Lead Channel Pacing Threshold Pulse Width: 0.4 ms
Lead Channel Sensing Intrinsic Amplitude: 11 mV
Lead Channel Sensing Intrinsic Amplitude: 6.5 mV
Lead Channel Setting Pacing Amplitude: 2.5 V
Lead Channel Setting Pacing Pulse Width: 0.4 ms
Lead Channel Setting Sensing Sensitivity: 1.2 mV
Zone Setting Status: 755011

## 2022-11-20 ENCOUNTER — Telehealth: Payer: Self-pay | Admitting: Internal Medicine

## 2022-11-20 MED ORDER — ATORVASTATIN CALCIUM 10 MG PO TABS: 10.0000 mg | ORAL_TABLET | Freq: Every day | ORAL | 3 refills | Status: DC

## 2022-11-20 NOTE — Telephone Encounter (Signed)
See below

## 2022-11-20 NOTE — Telephone Encounter (Signed)
Ok sure this can be done ,  thanks

## 2022-11-20 NOTE — Telephone Encounter (Signed)
And will like it to be sent to his pharmacy is : [facility] - [place], [place] - [address] [facility] AT Portal to [employer]

## 2022-11-20 NOTE — Telephone Encounter (Signed)
Pt wife called and stated that Dr. Jonny Ruiz gave him a prescription called Evolocumab (REPATHA SURECLICK) 140 MG/ML Ivory Broad [[id number]]  PT researched it and decided he was NOT going to take it. He wants to know if he could get this alterative called LIPITOR (atorvastatin calcium) tablets.[name]

## 2022-11-25 ENCOUNTER — Other Ambulatory Visit: Payer: Self-pay | Admitting: Internal Medicine

## 2022-11-25 MED ORDER — HYDROCODONE-ACETAMINOPHEN 10-325 MG PO TABS: 1.0000 | ORAL_TABLET | Freq: Every day | ORAL | 0 refills | Status: DC | PRN

## 2022-11-28 ENCOUNTER — Telehealth: Payer: Self-pay | Admitting: Internal Medicine

## 2022-11-28 ENCOUNTER — Other Ambulatory Visit: Payer: Self-pay

## 2022-11-28 MED ORDER — TAMSULOSIN HCL 0.4 MG PO CAPS: 0.4000 mg | ORAL_CAPSULE | Freq: Two times a day (BID) | ORAL | 3 refills | Status: DC

## 2022-11-28 NOTE — Telephone Encounter (Signed)
Prescription Request  11/28/2022  LOV: 11/11/2022  What is the name of the medication or equipment? tamsulosin (FLOMAX) 0.4 MG CAPS capsul   Have you contacted your pharmacy to request a refill? No   Which pharmacy would you like this sent to?     [facility] - [place], [place] - [address] [facility] AT Portal to [address] [employer] [address] [place] [address] Phone: [phone] Fax: [phone]  Patient notified that their request is being sent to the clinical staff for review and that they should receive a response within 2 business days.   Please advise at Mobile [phone] (mobile)

## 2022-12-03 NOTE — Progress Notes (Signed)
Remote pacemaker transmission.   

## 2022-12-17 ENCOUNTER — Encounter: Payer: Self-pay | Admitting: Internal Medicine

## 2022-12-17 ENCOUNTER — Ambulatory Visit (INDEPENDENT_AMBULATORY_CARE_PROVIDER_SITE_OTHER): Payer: Medicare Other | Admitting: Internal Medicine

## 2022-12-17 VITALS — BP 122/74 | HR 76 | Temp 98.1°F | Ht 70.0 in | Wt 176.0 lb

## 2022-12-17 DIAGNOSIS — I10 Essential (primary) hypertension: Secondary | ICD-10-CM

## 2022-12-17 DIAGNOSIS — E559 Vitamin D deficiency, unspecified: Secondary | ICD-10-CM

## 2022-12-17 DIAGNOSIS — R739 Hyperglycemia, unspecified: Secondary | ICD-10-CM | POA: Diagnosis not present

## 2022-12-17 DIAGNOSIS — R531 Weakness: Secondary | ICD-10-CM | POA: Diagnosis not present

## 2022-12-17 DIAGNOSIS — E782 Mixed hyperlipidemia: Secondary | ICD-10-CM

## 2022-12-17 LAB — URINALYSIS, ROUTINE W REFLEX MICROSCOPIC
Bilirubin Urine: NEGATIVE
Hgb urine dipstick: NEGATIVE
Ketones, ur: NEGATIVE
Leukocytes,Ua: NEGATIVE
Nitrite: NEGATIVE
RBC / HPF: NONE SEEN (ref 0–?)
Specific Gravity, Urine: 1.015 (ref 1.000–1.030)
Total Protein, Urine: NEGATIVE
Urine Glucose: NEGATIVE
Urobilinogen, UA: 0.2 (ref 0.0–1.0)
WBC, UA: NONE SEEN (ref 0–?)
pH: 6.5 (ref 5.0–8.0)

## 2022-12-17 LAB — CBC WITH DIFFERENTIAL/PLATELET
Basophils Absolute: 0 10*3/uL (ref 0.0–0.1)
Basophils Relative: 0.2 % (ref 0.0–3.0)
Eosinophils Absolute: 0.1 10*3/uL (ref 0.0–0.7)
Eosinophils Relative: 1 % (ref 0.0–5.0)
HCT: 42.6 % (ref 39.0–52.0)
Hemoglobin: 14.3 g/dL (ref 13.0–17.0)
Lymphocytes Relative: 40 % (ref 12.0–46.0)
Lymphs Abs: 2.8 10*3/uL (ref 0.7–4.0)
MCHC: 33.5 g/dL (ref 30.0–36.0)
MCV: 98.9 fL (ref 78.0–100.0)
Monocytes Absolute: 0.6 10*3/uL (ref 0.1–1.0)
Monocytes Relative: 8.6 % (ref 3.0–12.0)
Neutro Abs: 3.5 10*3/uL (ref 1.4–7.7)
Neutrophils Relative %: 50.2 % (ref 43.0–77.0)
Platelets: 235 10*3/uL (ref 150.0–400.0)
RBC: 4.3 Mil/uL (ref 4.22–5.81)
RDW: 12.9 % (ref 11.5–15.5)
WBC: 6.9 10*3/uL (ref 4.0–10.5)

## 2022-12-17 LAB — BASIC METABOLIC PANEL
BUN: 14 mg/dL (ref 6–23)
CO2: 28 meq/L (ref 19–32)
Calcium: 9.8 mg/dL (ref 8.4–10.5)
Chloride: 97 meq/L (ref 96–112)
Creatinine, Ser: 0.9 mg/dL (ref 0.40–1.50)
GFR: 73.96 mL/min (ref >60.00)
Glucose, Bld: 111 mg/dL — ABNORMAL HIGH (ref 70–99)
Potassium: 4.8 meq/L (ref 3.5–5.1)
Sodium: 133 meq/L — ABNORMAL LOW (ref 135–145)

## 2022-12-17 LAB — HEMOGLOBIN A1C: Hgb A1c MFr Bld: 5.8 % (ref 4.6–6.5)

## 2022-12-17 LAB — LIPID PANEL
Cholesterol: 127 mg/dL (ref 0–200)
HDL: 57.6 mg/dL (ref >39.00)
LDL Cholesterol: 59 mg/dL (ref 0–99)
NonHDL: 69.13
Total CHOL/HDL Ratio: 2
Triglycerides: 53 mg/dL (ref 0.0–149.0)
VLDL: 10.6 mg/dL (ref 0.0–40.0)

## 2022-12-17 LAB — HEPATIC FUNCTION PANEL
ALT: 18 U/L (ref 0–53)
AST: 23 U/L (ref 0–37)
Albumin: 4.3 g/dL (ref 3.5–5.2)
Alkaline Phosphatase: 95 U/L (ref 39–117)
Bilirubin, Direct: 0.2 mg/dL (ref 0.0–0.3)
Total Bilirubin: 1.1 mg/dL (ref 0.2–1.2)
Total Protein: 7.4 g/dL (ref 6.0–8.3)

## 2022-12-17 LAB — TSH: TSH: 0.57 u[IU]/mL (ref 0.35–5.50)

## 2022-12-17 LAB — VITAMIN D 25 HYDROXY (VIT D DEFICIENCY, FRACTURES): VITD: 78.43 ng/mL (ref 30.00–100.00)

## 2022-12-17 NOTE — Progress Notes (Signed)
Patient ID: Jonathan Orozco, male   DOB: 01-Dec-1928, 89 y.o.   MRN: [id number]        Chief Complaint: follow up HTN, HLD and hyperglycemia , general weakness       HPI:  Jonathan Orozco is a 89 y.o. male here overall doing ok, Pt denies chest pain, increased sob or doe, wheezing, orthopnea, PND, increased LE swelling, palpitations, dizziness or syncope.   Pt denies polydipsia, polyuria, or new focal neuro s/s.    Pt denies fever, wt loss, night sweats, loss of appetite, or other constitutional symptoms  Does have worsening generalized weakness,   Recent PSA stable at 15 per wife per recent urology, started gemtesa 75 mg daily.  Does c/o ongoing fatigue, but denies signficant daytime hypersomnolence.  Denies worsening depressive symptoms, suicidal ideation, or panic  BP Readings from Last 3 Encounters:  12/17/22 122/74  11/11/22 122/64  08/14/22 124/72         Past Medical History:  Diagnosis Date   ABUSE, ALCOHOL, IN REMISSION 04/08/2007   ALLERGIC RHINITIS 09/29/2006   Arthritis    "hands" (07/02/2016)   Atrial fibrillation (HCC)    BENIGN PROSTATIC HYPERTROPHY 09/29/2006   BPPV (benign paroxysmal positional vertigo) 04/08/2007   CAD (coronary artery disease)    a. LHC 2/17: EF 55-65%, LM 75, pLAD 75, oD1 75, oD2 65, D3 85, oLCx 99, oOM1 75, pRCA 25 >> CABG   Carotid stenosis    a. 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   Diverticulosis    ERECTILE DYSFUNCTION 09/29/2006   GERD (gastroesophageal reflux disease)    GLUCOSE INTOLERANCE 03/19/2010   HYPERLIPIDEMIA 09/29/2006   HYPERTENSION 09/29/2006   Long term (current) use of anticoagulants 04/26/2010   MELANOMA, MALIGNANT, SKIN NOS 09/29/2006   other skin cancers -no further melanoma   OSTEOARTHROSIS NOS, OTHER [facility] SITE 09/29/2006   Other specified forms of hearing loss 08/10/2009   Pneumonia ~ 1969   Presence of  permanent cardiac pacemaker 06/02/2016   VENTRICULAR TACHYCARDIA 09/29/2006   Past Surgical History:  Procedure Laterality Date   AV NODE ABLATION N/A 07/02/2016   Procedure: AV Node Ablation;  Surgeon: Duke Salvia, MD;  Location: [facility] INVASIVE CV LAB;  Service: Cardiovascular;  Laterality: N/A;   CARDIAC CATHETERIZATION N/A 03/12/2015   Procedure: Left Heart Cath and Coronary Angiography;  Surgeon: Lyn Records, MD;  Location: [facility] INVASIVE CV LAB;  Service: Cardiovascular;  Laterality: N/A;   CARDIOVERSION N/A 12/19/2015   Procedure: CARDIOVERSION;  Surgeon: Jake Bathe, MD;  Location: [facility] ENDOSCOPY;  Service: Cardiovascular;  Laterality: N/A;   CARDIOVERSION N/A 03/24/2016   Procedure: CARDIOVERSION;  Surgeon: Lars Masson, MD;  Location: [facility] ENDOSCOPY;  Service: Cardiovascular;  Laterality: N/A;   CATARACT EXTRACTION W/ INTRAOCULAR LENS  IMPLANT, BILATERAL Bilateral    CLIPPING OF ATRIAL APPENDAGE N/A 03/14/2015   Procedure: CLIPPING OF ATRIAL APPENDAGE;  Surgeon: Loreli Slot, MD;  Location: [facility] OR;  Service: Open Heart Surgery;  Laterality: N/A;   COLONOSCOPY WITH PROPOFOL N/A 12/08/2014   Procedure: COLONOSCOPY WITH PROPOFOL;  Surgeon: Jeani Hawking, MD;  Location: [facility] ENDOSCOPY;  Service: Endoscopy;  Laterality: N/A;   CORONARY ARTERY BYPASS GRAFT N/A 03/14/2015   Procedure: CORONARY ARTERY BYPASS GRAFTING (CABG) x 4 (LIMA to LAD, SVG to DIAGONAL 2, SVG SEQUENTIALLY to OM1 and OM2);  Surgeon: Viviann Spare  Lars Pinks, MD;  Location: [facility] OR;  Service: Open Heart Surgery;  Laterality: N/A;   INCISION AND DRAINAGE N/A 07/03/2016   Procedure: INCISION AND DRAINAGE of CHEST ABSCESS;  Surgeon: Loreli Slot, MD;  Location: [facility] OR;  Service: Thoracic;  Laterality: N/A;   JOINT REPLACEMENT     KNEE ARTHROSCOPY Left    MELANOMA EXCISION     "upper back"   PACEMAKER IMPLANT N/A 06/02/2016   Procedure: Pacemaker Implant;  Surgeon: Duke Salvia, MD;  Location: [facility] INVASIVE CV  LAB;  Service: Cardiovascular;  Laterality: N/A;   SHOULDER OPEN ROTATOR CUFF REPAIR Left    SKIN CANCER EXCISION Right    "cheek; real deep"   TEE WITHOUT CARDIOVERSION N/A 03/14/2015   Procedure: TRANSESOPHAGEAL ECHOCARDIOGRAM (TEE);  Surgeon: Loreli Slot, MD;  Location: [facility] OR;  Service: Open Heart Surgery;  Laterality: N/A;   TEE WITHOUT CARDIOVERSION N/A 12/19/2015   Procedure: TRANSESOPHAGEAL ECHOCARDIOGRAM (TEE);  Surgeon: Jake Bathe, MD;  Location: [facility] ENDOSCOPY;  Service: Cardiovascular;  Laterality: N/A;   TEE WITHOUT CARDIOVERSION N/A 03/24/2016   Procedure: TRANSESOPHAGEAL ECHOCARDIOGRAM (TEE);  Surgeon: Lars Masson, MD;  Location: [facility] ENDOSCOPY;  Service: Cardiovascular;  Laterality: N/A;   TONSILLECTOMY     TOTAL KNEE ARTHROPLASTY Right     reports that he has quit smoking. His smoking use included cigarettes. He has a 52 pack-year smoking history. He has never used smokeless tobacco. He reports that he does not drink alcohol and does not use drugs. family history includes Diabetes in his brother; Esophageal cancer in his father. Allergies  Allergen Reactions   Amiodarone Hcl Other (See Comments)    Patient reports photosensitivity   Ace Inhibitors Cough   Current Outpatient Medications on File Prior to Visit  Medication Sig Dispense Refill   atorvastatin (LIPITOR) 10 MG tablet Take 1 tablet (10 mg total) by mouth daily. 90 tablet 3   b complex vitamins tablet Take 1 tablet by mouth daily.     citalopram (CELEXA) 10 MG tablet Take 1 tablet (10 mg total) by mouth daily. 90 tablet 3   ELIQUIS 5 MG TABS tablet TAKE 1 TABLET TWICE A DAY 180 tablet 1   Evolocumab (REPATHA SURECLICK) 140 MG/ML SOAJ Inject 140 mg into the skin every 14 (fourteen) days. 6 mL 3   GEMTESA 75 MG TABS Take 1 tablet by mouth daily.     HYDROcodone-acetaminophen (NORCO) 10-325 MG tablet Take 1 tablet by mouth daily as needed for severe pain (pain score 7-10). 30 tablet 0   losartan  (COZAAR) 50 MG tablet Take 1 tablet (50 mg total) by mouth daily. 90 tablet 3   Multiple Vitamin (MULTIVITAMIN) tablet Take 1 tablet by mouth daily.     nitroGLYCERIN (NITROSTAT) 0.4 MG SL tablet Take 1 tablet under your tongue, while sitting.  If no relief of pain may repeat NTG, one tab every 5 minutes up to 3 tablets total over 15 minutes.  If no relief CALL 911. 25 tablet 3   omega-3 acid ethyl esters (LOVAZA) 1 g capsule Take by mouth.     Omega-3 Fatty Acids (FISH OIL PO) Fish Oil     pantoprazole (PROTONIX) 40 MG tablet Take 1 tablet by mouth once daily 90 tablet 3   Psyllium (METAMUCIL PO) Take 15 mLs by mouth 3 (three) times daily as needed (constipation).      tamsulosin (FLOMAX) 0.4 MG CAPS capsule Take 1 capsule (0.4 mg total) by mouth in the  morning and at bedtime. 180 capsule 3   triamcinolone cream (KENALOG) 0.1 % SMARTSIG:Topical 1-2 Times Daily PRN     No current facility-administered medications on file prior to visit.        ROS:  All others reviewed and negative.  Objective        PE:  BP 122/74 (BP Location: Left Arm, Patient Position: Sitting, Cuff Size: Normal)   Pulse 76   Temp 98.1 F (36.7 C) (Oral)   Ht 5\' 10"  (1.778 m)   Wt 176 lb (79.8 kg)   SpO2 99%   BMI 25.25 kg/m                 Constitutional: Pt appears in NAD               HENT: Head: NCAT.                Right Ear: External ear normal.                 Left Ear: External ear normal.                Eyes: . Pupils are equal, round, and reactive to light. Conjunctivae and EOM are normal               Nose: without d/c or deformity               Neck: Neck supple. Gross normal ROM               Cardiovascular: Normal rate and regular rhythm.                 Pulmonary/Chest: Effort normal and breath sounds without rales or wheezing.                Abd:  Soft, NT, ND, + BS, no organomegaly               Neurological: Pt is alert. At baseline orientation, motor grossly intact               Skin: Skin  is warm. No rashes, no other new lesions, LE edema - none               Psychiatric: Pt behavior is normal without agitation   Micro: none  Cardiac tracings I have personally interpreted today:  none  Pertinent Radiological findings (summarize): none   Lab Results  Component Value Date   WBC 6.9 12/17/2022   HGB 14.3 12/17/2022   HCT 42.6 12/17/2022   PLT 235.0 12/17/2022   GLUCOSE 111 (H) 12/17/2022   CHOL 127 12/17/2022   TRIG 53.0 12/17/2022   HDL 57.60 12/17/2022   LDLDIRECT 159.2 09/29/2006   LDLCALC 59 12/17/2022   ALT 18 12/17/2022   AST 23 12/17/2022   NA 133 (L) 12/17/2022   K 4.8 12/17/2022   CL 97 12/17/2022   CREATININE 0.90 12/17/2022   BUN 14 12/17/2022   CO2 28 12/17/2022   TSH 0.57 12/17/2022   PSA 15.86 (H) 03/04/2022   INR 3.1 (A) 06/01/2018   HGBA1C 5.8 12/17/2022   Assessment/Plan:  CARTAVIOUS WILLMANN is a 89 y.o. White or Caucasian [1] male with  has a past medical history of ABUSE, ALCOHOL, IN REMISSION (04/08/2007), ALLERGIC RHINITIS (09/29/2006), Arthritis, Atrial fibrillation (HCC), BENIGN PROSTATIC HYPERTROPHY (09/29/2006), BPPV (benign paroxysmal positional vertigo) (04/08/2007), CAD (coronary artery disease), Carotid stenosis, Chronic lower back pain, COLONIC POLYPS, HX OF (06/23/2007), DISORDERS,  ORGANIC INSOMNIA NOS (09/29/2006), Diverticulosis, ERECTILE DYSFUNCTION (09/29/2006), GERD (gastroesophageal reflux disease), GLUCOSE INTOLERANCE (03/19/2010), HYPERLIPIDEMIA (09/29/2006), HYPERTENSION (09/29/2006), Long term (current) use of anticoagulants (04/26/2010), MELANOMA, MALIGNANT, SKIN NOS (09/29/2006), OSTEOARTHROSIS NOS, OTHER SPEC SITE (09/29/2006), Other specified forms of hearing loss (08/10/2009), Pneumonia (~ 1969), Presence of permanent cardiac pacemaker (06/02/2016), and VENTRICULAR TACHYCARDIA (09/29/2006).  Hypertension, uncontrolled BP Readings from Last 3 Encounters:  12/17/22 122/74  11/11/22 122/64  08/14/22 124/72   Stable, pt to  continue medical treatment losartan 50 qd   Hyperlipidemia Lab Results  Component Value Date   LDLCALC 59 12/17/2022   Stable, pt to continue current statin lipitor 10 qd   Hyperglycemia Lab Results  Component Value Date   HGBA1C 5.8 12/17/2022   Stable, pt to continue current medical treatment  - diet ,w t control   Generalized weakness Etiology unclear, appears to be geriatric decline, for referral PT  Followup: Return in about 6 months (around 06/16/2023).  Oliver Barre, MD 12/20/2022 2:11 PM [facility]  Primary Care - [facility] Internal Medicine

## 2022-12-17 NOTE — Progress Notes (Signed)
The test results show that your current treatment is OK, as the tests are stable.  Please continue the same plan.  There is no other need for change of treatment or further evaluation based on these results, at this time.  thanks 

## 2022-12-17 NOTE — Patient Instructions (Addendum)
Please continue all other medications as before, and refills have been done if requested.  Please have the pharmacy call with any other refills you may need.  Please continue your efforts at being more active, low cholesterol diet, and weight control.  Please keep your appointments with your specialists as you may have planned  You will be contacted regarding the referral for: PT  Please go to the LAB at the blood drawing area for the tests to be done  You will be contacted by phone if any changes need to be made immediately.  Otherwise, you will receive a letter about your results with an explanation, but please check with MyChart first.  Please make an Appointment to return in 6 months, or sooner if needed

## 2022-12-20 ENCOUNTER — Encounter: Payer: Self-pay | Admitting: Internal Medicine

## 2022-12-20 DIAGNOSIS — R531 Weakness: Secondary | ICD-10-CM | POA: Insufficient documentation

## 2022-12-20 NOTE — Assessment & Plan Note (Signed)
BP Readings from Last 3 Encounters:  12/17/22 122/74  11/11/22 122/64  08/14/22 124/72   Stable, pt to continue medical treatment losartan 50 qd

## 2022-12-20 NOTE — Assessment & Plan Note (Signed)
Lab Results  Component Value Date   HGBA1C 5.8 12/17/2022   Stable, pt to continue current medical treatment  - diet ,w t control

## 2022-12-20 NOTE — Assessment & Plan Note (Signed)
Lab Results  Component Value Date   LDLCALC 59 12/17/2022   Stable, pt to continue current statin lipitor 10 qd

## 2022-12-20 NOTE — Assessment & Plan Note (Signed)
Etiology unclear, appears to be geriatric decline, for referral PT

## 2022-12-20 NOTE — Addendum Note (Signed)
Addended by: Corwin Levins on: 12/20/2022 02:12 PM   Modules accepted: Orders

## 2022-12-26 ENCOUNTER — Other Ambulatory Visit: Payer: Self-pay | Admitting: Internal Medicine

## 2022-12-26 MED ORDER — HYDROCODONE-ACETAMINOPHEN 10-325 MG PO TABS: 1.0000 | ORAL_TABLET | Freq: Every day | ORAL | 0 refills | Status: DC | PRN

## 2023-01-13 ENCOUNTER — Encounter: Payer: Medicare Other | Admitting: Internal Medicine

## 2023-01-15 ENCOUNTER — Ambulatory Visit: Payer: Medicare Other | Admitting: Physical Therapy

## 2023-01-19 ENCOUNTER — Other Ambulatory Visit: Payer: Self-pay | Admitting: Internal Medicine

## 2023-01-19 DIAGNOSIS — I4891 Unspecified atrial fibrillation: Secondary | ICD-10-CM

## 2023-01-19 DIAGNOSIS — Z7901 Long term (current) use of anticoagulants: Secondary | ICD-10-CM

## 2023-01-19 NOTE — Telephone Encounter (Signed)
Prescription refill request for Eliquis received. Indication: AF Last office visit: 03/26/22  Catalina Gravel MD Scr: 0.90 on 12/17/22  Epic Age: 89 Weight: 80.4kg  Based on above findings Eliquis 5mg  twice daily is the appropriate dose.  Refill approved.

## 2023-01-20 ENCOUNTER — Other Ambulatory Visit: Payer: Self-pay | Admitting: Internal Medicine

## 2023-01-20 ENCOUNTER — Telehealth: Payer: Self-pay | Admitting: Internal Medicine

## 2023-01-20 MED ORDER — LOSARTAN POTASSIUM 50 MG PO TABS: 50.0000 mg | ORAL_TABLET | Freq: Every day | ORAL | 3 refills | Status: DC

## 2023-01-20 MED ORDER — HYDROCODONE-ACETAMINOPHEN 10-325 MG PO TABS: 1.0000 | ORAL_TABLET | Freq: Every day | ORAL | 0 refills | Status: DC | PRN

## 2023-01-20 NOTE — Telephone Encounter (Signed)
Prescription Request  01/20/2023  LOV: 12/17/2022  What is the name of the medication or equipment?  losartan (COZAAR) 50 MG tablet [   Have you contacted your pharmacy to request a refill? No   Which pharmacy would you like this sent to?     [facility] - [place], [place] - [address] [facility] AT Portal to Registered [facility] [address] [place] [address] Phone: [phone] Fax: [phone]  Patient notified that their request is being sent to the clinical staff for review and that they should receive a response within 2 business days.   Please advise at Mobile [phone] (mobile)

## 2023-02-06 ENCOUNTER — Ambulatory Visit: Payer: Medicare Other | Attending: Internal Medicine | Admitting: Internal Medicine

## 2023-02-06 ENCOUNTER — Encounter: Payer: Self-pay | Admitting: Internal Medicine

## 2023-02-06 VITALS — BP 148/62 | HR 72 | Ht 70.0 in | Wt 179.0 lb

## 2023-02-06 DIAGNOSIS — Z950 Presence of cardiac pacemaker: Secondary | ICD-10-CM | POA: Diagnosis not present

## 2023-02-06 DIAGNOSIS — I5032 Chronic diastolic (congestive) heart failure: Secondary | ICD-10-CM

## 2023-02-06 DIAGNOSIS — I4819 Other persistent atrial fibrillation: Secondary | ICD-10-CM

## 2023-02-06 LAB — CUP PACEART INCLINIC DEVICE CHECK
Battery Remaining Longevity: 71 mo
Battery Voltage: 2.97 V
Brady Statistic RV Percent Paced: 99.91 %
Date Time Interrogation Session: 20250103160134
Implantable Lead Connection Status: 753985
Implantable Lead Implant Date: 20180430
Implantable Lead Location: 753860
Implantable Lead Model: 5076
Implantable Pulse Generator Implant Date: 20180430
Lead Channel Impedance Value: 380 Ohm
Lead Channel Impedance Value: 570 Ohm
Lead Channel Pacing Threshold Amplitude: 0.625 V
Lead Channel Pacing Threshold Pulse Width: 0.4 ms
Lead Channel Sensing Intrinsic Amplitude: 4.875 mV
Lead Channel Sensing Intrinsic Amplitude: 5.625 mV
Lead Channel Setting Pacing Amplitude: 2.5 V
Lead Channel Setting Pacing Pulse Width: 0.4 ms
Lead Channel Setting Sensing Sensitivity: 1.2 mV
Zone Setting Status: 755011

## 2023-02-06 NOTE — Progress Notes (Signed)
 Patient Care Team: Norleen Lynwood ORN, MD as PCP - General (Internal Medicine) Dann Candyce RAMAN, MD as PCP - Cardiology (Cardiology) Fernande Elspeth BROCKS, MD as PCP - Electrophysiology (Cardiology) Leslee Reusing, MD as Consulting Physician (Ophthalmology) Shona Norleen, MD as Consulting Physician (Dermatology) Jesus Oliphant, MD as Consulting Physician (Otolaryngology)   HPI  Jonathan Orozco is a 89 y.o. male Seen in followup for outflow tract ectopy and atrial flutters for which  he underwent EP testing and 3-D mapping which failed to produce stable arrhythmias; medical therapy was undertaken.  He has been intolerant of amiodarone .-year-old atrial fibrillation.  Wanted to go see Dr. TAMSEN for second opinion who recommended AV junction ablation and pacemaker implantation which was accomplished 5/18   Coronary artery disease catheterization 2/17 left main and three-vessel disease>>CABG  The patient denies chest pain, shortness of breath, nocturnal dyspnea, orthopnea or peripheral edema.  There have been no palpitations, lightheadedness or syncope.   h  DATE TEST EF   2//17 LHC  LM and 3V CAD  2/18 TEE 55-60%           Date Cr K Hgb  7/23  0.87 4.4 14.3  11/24 0.9 4.8 14.3      Past Medical History:  Diagnosis Date   ABUSE, ALCOHOL, IN REMISSION 04/08/2007   ALLERGIC RHINITIS 09/29/2006   Arthritis    hands (07/02/2016)   Atrial fibrillation (HCC)    BENIGN PROSTATIC HYPERTROPHY 09/29/2006   BPPV (benign paroxysmal positional vertigo) 04/08/2007   CAD (coronary artery disease)    a. LHC 2/17: EF 55-65%, LM 75, pLAD 75, oD1 75, oD2 65, D3 85, oLCx 99, oOM1 75, pRCA 25 >> CABG   Carotid stenosis    a. Carotid US  2/17:  Bilateral ICA 1-39% ICA   Chronic lower back pain    COLONIC POLYPS, HX OF 06/23/2007   DISORDERS, ORGANIC INSOMNIA NOS 09/29/2006   Diverticulosis    ERECTILE DYSFUNCTION 09/29/2006   GERD (gastroesophageal reflux disease)    GLUCOSE INTOLERANCE 03/19/2010    HYPERLIPIDEMIA 09/29/2006   HYPERTENSION 09/29/2006   Long term (current) use of anticoagulants 04/26/2010   MELANOMA, MALIGNANT, SKIN NOS 09/29/2006   other skin cancers -no further melanoma   OSTEOARTHROSIS NOS, OTHER [facility] SITE 09/29/2006   Other specified forms of hearing loss 08/10/2009   Pneumonia ~ 1969   Presence of permanent cardiac pacemaker 06/02/2016   VENTRICULAR TACHYCARDIA 09/29/2006    Past Surgical History:  Procedure Laterality Date   AV NODE ABLATION N/A 07/02/2016   Procedure: AV Node Ablation;  Surgeon: Fernande Elspeth BROCKS, MD;  Location: [facility] INVASIVE CV LAB;  Service: Cardiovascular;  Laterality: N/A;   CARDIAC CATHETERIZATION N/A 03/12/2015   Procedure: Left Heart Cath and Coronary Angiography;  Surgeon: Victory ORN Sharps, MD;  Location: [facility] INVASIVE CV LAB;  Service: Cardiovascular;  Laterality: N/A;   CARDIOVERSION N/A 12/19/2015   Procedure: CARDIOVERSION;  Surgeon: Oneil BROCKS Parchment, MD;  Location: [facility] ENDOSCOPY;  Service: Cardiovascular;  Laterality: N/A;   CARDIOVERSION N/A 03/24/2016   Procedure: CARDIOVERSION;  Surgeon: Leim VEAR Moose, MD;  Location: [facility] ENDOSCOPY;  Service: Cardiovascular;  Laterality: N/A;   CATARACT EXTRACTION W/ INTRAOCULAR LENS  IMPLANT, BILATERAL Bilateral    CLIPPING OF ATRIAL APPENDAGE N/A 03/14/2015   Procedure: CLIPPING OF ATRIAL APPENDAGE;  Surgeon: Elspeth BROCKS Millers, MD;  Location: [facility] OR;  Service: Open Heart Surgery;  Laterality: N/A;   COLONOSCOPY WITH PROPOFOL  N/A 12/08/2014   Procedure: COLONOSCOPY WITH PROPOFOL ;  Surgeon: Belvie  Rollin, MD;  Location: [facility];  Service: Endoscopy;  Laterality: N/A;   CORONARY ARTERY BYPASS GRAFT N/A 03/14/2015   Procedure: CORONARY ARTERY BYPASS GRAFTING (CABG) x 4 (LIMA to LAD, SVG to DIAGONAL 2, SVG SEQUENTIALLY to OM1 and OM2);  Surgeon: Elspeth JAYSON Millers, MD;  Location: [facility] OR;  Service: Open Heart Surgery;  Laterality: N/A;   INCISION AND DRAINAGE N/A 07/03/2016   Procedure: INCISION AND  DRAINAGE of CHEST ABSCESS;  Surgeon: Millers Elspeth JAYSON, MD;  Location: [facility] OR;  Service: Thoracic;  Laterality: N/A;   JOINT REPLACEMENT     KNEE ARTHROSCOPY Left    MELANOMA EXCISION     upper back   PACEMAKER IMPLANT N/A 06/02/2016   Procedure: Pacemaker Implant;  Surgeon: Elspeth JAYSON Sage, MD;  Location: [facility] INVASIVE CV LAB;  Service: Cardiovascular;  Laterality: N/A;   SHOULDER OPEN ROTATOR CUFF REPAIR Left    SKIN CANCER EXCISION Right    cheek; real deep   TEE WITHOUT CARDIOVERSION N/A 03/14/2015   Procedure: TRANSESOPHAGEAL ECHOCARDIOGRAM (TEE);  Surgeon: Elspeth JAYSON Millers, MD;  Location: [facility] OR;  Service: Open Heart Surgery;  Laterality: N/A;   TEE WITHOUT CARDIOVERSION N/A 12/19/2015   Procedure: TRANSESOPHAGEAL ECHOCARDIOGRAM (TEE);  Surgeon: Oneil JAYSON Parchment, MD;  Location: [facility] ENDOSCOPY;  Service: Cardiovascular;  Laterality: N/A;   TEE WITHOUT CARDIOVERSION N/A 03/24/2016   Procedure: TRANSESOPHAGEAL ECHOCARDIOGRAM (TEE);  Surgeon: Leim VEAR Moose, MD;  Location: [facility] ENDOSCOPY;  Service: Cardiovascular;  Laterality: N/A;   TONSILLECTOMY     TOTAL KNEE ARTHROPLASTY Right     Current Outpatient Medications  Medication Sig Dispense Refill   atorvastatin  (LIPITOR) 10 MG tablet Take 1 tablet (10 mg total) by mouth daily. 90 tablet 3   citalopram  (CELEXA ) 10 MG tablet Take 1 tablet (10 mg total) by mouth daily. 90 tablet 3   ELIQUIS  5 MG TABS tablet TAKE 1 TABLET TWICE A DAY 180 tablet 1   HYDROcodone -acetaminophen  (NORCO) 10-325 MG tablet Take 1 tablet by mouth daily as needed for severe pain (pain score 7-10). 30 tablet 0   losartan  (COZAAR ) 50 MG tablet Take 1 tablet (50 mg total) by mouth daily. 90 tablet 3   Multiple Vitamin (MULTIVITAMIN) tablet Take 1 tablet by mouth daily.     nitroGLYCERIN  (NITROSTAT ) 0.4 MG SL tablet Take 1 tablet under your tongue, while sitting.  If no relief of pain may repeat NTG, one tab every 5 minutes up to 3 tablets total over 15 minutes.  If  no relief CALL 911. (Patient taking differently: as needed. Take 1 tablet under your tongue, while sitting.  If no relief of pain may repeat NTG, one tab every 5 minutes up to 3 tablets total over 15 minutes.  If no relief CALL 911.) 25 tablet 3   Omega-3 Fatty Acids  (FISH OIL PO) Take 1,000 mg by mouth daily.     pantoprazole  (PROTONIX ) 40 MG tablet Take 1 tablet by mouth once daily 90 tablet 3   Psyllium (METAMUCIL PO) Take 15 mLs by mouth 3 (three) times daily as needed (constipation).      tamsulosin  (FLOMAX ) 0.4 MG CAPS capsule Take 1 capsule (0.4 mg total) by mouth in the morning and at bedtime. 180 capsule 3   triamcinolone  cream (KENALOG ) 0.1 % SMARTSIG:Topical 1-2 Times Daily PRN     No current facility-administered medications for this visit.    Allergies  Allergen Reactions   Amiodarone  Hcl Other (See Comments)    Patient reports photosensitivity  Ace Inhibitors Cough    Review of Systems negative except from HPI and PMH  Physical Exam BP 94/62   Pulse 72   Ht 5' 10 (1.778 m)   Wt 179 lb (81.2 kg)   SpO2 99%   BMI 25.68 kg/m  Well developed and well nourished in no acute distress HENT normal Neck supple with JVP-flat Clear Device pocket well healed; without hematoma or erythema.  There is no tethering  Regular rate and rhythm, no  gallop No  murmur Abd-soft with active BS No Clubbing cyanosis  edema Skin-warm and dry A & Oriented  Grossly normal sensory and motor function  ECG atrial flutter with a ventricular pacing   Device function is normal. Programming changes none  See Paceart for details      Assessment and  Plan  Atrial fibrillation/flutter permanent  Complete heart block  Pacemaker Medtronic   Hypertension  CAD s/p CABG   HFpEF     PM Medtronic    No angina continue Apixaban   and atorvastatin   Euvolemic prn diuretics  BP repeat was normal ; continue losartan  although may need to decrease dose   No bleeding with afib,c ontinue  Apixaban ; CBC normal

## 2023-02-06 NOTE — Patient Instructions (Signed)
Medication Instructions:  Your physician recommends that you continue on your current medications as directed. Please refer to the Current Medication list given to you today.  *If you need a refill on your cardiac medications before your next appointment, please call your pharmacy*   Lab Work: None ordered.  If you have labs (blood work) drawn today and your tests are completely normal, you will receive your results only by: MyChart Message (if you have MyChart) OR A paper copy in the mail If you have any lab test that is abnormal or we need to change your treatment, we will call you to review the results.   Testing/Procedures: None ordered.    Follow-Up: At  [facility], you and your health needs are our priority.  As part of our continuing mission to provide you with exceptional heart care, we have created designated Provider Care Teams.  These Care Teams include your primary Cardiologist (physician) and Advanced Practice Providers (APPs -  Physician Assistants and Nurse Practitioners) who all work together to provide you with the care you need, when you need it.  We recommend signing up for the patient portal called "MyChart".  Sign up information is provided on this After Visit Summary.  MyChart is used to connect with patients for Virtual Visits (Telemedicine).  Patients are able to view lab/test results, encounter notes, upcoming appointments, etc.  Non-urgent messages can be sent to your provider as well.   To learn more about what you can do with MyChart, go to [email or web address].    Your next appointment:   9 months with Dr Klein 

## 2023-02-16 ENCOUNTER — Ambulatory Visit (INDEPENDENT_AMBULATORY_CARE_PROVIDER_SITE_OTHER): Payer: Medicare Other

## 2023-02-16 DIAGNOSIS — I5032 Chronic diastolic (congestive) heart failure: Secondary | ICD-10-CM | POA: Diagnosis not present

## 2023-02-16 DIAGNOSIS — I480 Paroxysmal atrial fibrillation: Secondary | ICD-10-CM

## 2023-02-17 LAB — CUP PACEART REMOTE DEVICE CHECK
Battery Remaining Longevity: 69 mo
Battery Voltage: 2.97 V
Brady Statistic RV Percent Paced: 99.86 %
Date Time Interrogation Session: 20250113092604
Implantable Lead Connection Status: 753985
Implantable Lead Implant Date: 20180430
Implantable Lead Location: 753860
Implantable Lead Model: 5076
Implantable Pulse Generator Implant Date: 20180430
Lead Channel Impedance Value: 361 Ohm
Lead Channel Impedance Value: 532 Ohm
Lead Channel Pacing Threshold Amplitude: 0.625 V
Lead Channel Pacing Threshold Pulse Width: 0.4 ms
Lead Channel Sensing Intrinsic Amplitude: 4.875 mV
Lead Channel Sensing Intrinsic Amplitude: 5.625 mV
Lead Channel Setting Pacing Amplitude: 2.5 V
Lead Channel Setting Pacing Pulse Width: 0.4 ms
Lead Channel Setting Sensing Sensitivity: 1.2 mV
Zone Setting Status: 755011

## 2023-02-20 ENCOUNTER — Other Ambulatory Visit: Payer: Self-pay | Admitting: Internal Medicine

## 2023-02-20 MED ORDER — HYDROCODONE-ACETAMINOPHEN 10-325 MG PO TABS: 1.0000 | ORAL_TABLET | Freq: Every day | ORAL | 0 refills | Status: DC | PRN

## 2023-03-13 ENCOUNTER — Telehealth: Payer: Self-pay | Admitting: Internal Medicine

## 2023-03-13 DIAGNOSIS — M545 Low back pain, unspecified: Secondary | ICD-10-CM

## 2023-03-13 DIAGNOSIS — I4891 Unspecified atrial fibrillation: Secondary | ICD-10-CM

## 2023-03-13 DIAGNOSIS — I5030 Unspecified diastolic (congestive) heart failure: Secondary | ICD-10-CM

## 2023-03-13 DIAGNOSIS — R269 Unspecified abnormalities of gait and mobility: Secondary | ICD-10-CM

## 2023-03-13 DIAGNOSIS — R531 Weakness: Secondary | ICD-10-CM

## 2023-03-13 NOTE — Telephone Encounter (Signed)
 Copied from CRM [id number]. Topic: Clinical - Medical Advice >> Mar 13, 2023  9:24 AM [name] wrote: Reason for CRM: Patient is calling to see if Dr. Norleen can write a prescription for a sliding transfer chair for his bathtub. Please call wife at [phone] when completed and she will pick up the order.

## 2023-03-13 NOTE — Telephone Encounter (Signed)
Ok done hardcopy to cma 

## 2023-03-13 NOTE — Telephone Encounter (Signed)
 Called and let Pt wife know it is ready for pickup and will be placed up front.

## 2023-04-01 ENCOUNTER — Encounter: Payer: Self-pay | Admitting: Internal Medicine

## 2023-04-01 NOTE — Addendum Note (Signed)
 Addended by: Geralyn Flash D on: 04/01/2023 11:31 AM   Modules accepted: Orders

## 2023-04-01 NOTE — Progress Notes (Signed)
 Remote pacemaker transmission.

## 2023-04-06 ENCOUNTER — Other Ambulatory Visit: Payer: Self-pay | Admitting: Internal Medicine

## 2023-04-06 MED ORDER — HYDROCODONE-ACETAMINOPHEN 10-325 MG PO TABS: 1.0000 | ORAL_TABLET | Freq: Every day | ORAL | 0 refills | Status: DC | PRN

## 2023-04-13 ENCOUNTER — Ambulatory Visit (INDEPENDENT_AMBULATORY_CARE_PROVIDER_SITE_OTHER)

## 2023-04-13 VITALS — BP 132/78 | HR 78 | Ht 69.0 in | Wt 173.5 lb

## 2023-04-13 DIAGNOSIS — Z0000 Encounter for general adult medical examination without abnormal findings: Secondary | ICD-10-CM | POA: Diagnosis not present

## 2023-04-13 NOTE — Progress Notes (Signed)
 Subjective:   Jonathan Orozco is a 89 y.o. who presents for a Medicare Wellness preventive visit.  Visit Complete: In person  AWV Questionnaire: No: Patient Medicare AWV questionnaire was not completed prior to this visit.  Cardiac Risk Factors include: advanced age (>70men, >37 women);hypertension;dyslipidemia;male gender     Objective:    Today's Vitals   04/13/23 1404  BP: 132/78  Pulse: 78  SpO2: 98%  Weight: 173 lb 8 oz (78.7 kg)  Height: 5\' 9"  (1.753 m)   Body mass index is 25.62 kg/m.     04/13/2023    2:02 PM 11/19/2021    1:09 PM 11/12/2020   12:12 PM 12/08/2019    1:31 PM 07/14/2019    1:15 PM 06/20/2019    6:38 AM 05/30/2019    3:35 PM  Advanced Directives  Does Patient Have a Medical Advance Directive? Yes Yes Yes Yes Yes No No  Type of Estate agent of [place];Living will Healthcare Power of [place];Living will  Healthcare Power of [place];Living will Healthcare Power of [place];Living will    Does patient want to make changes to medical advance directive?   No - Patient declined  No - Patient declined    Copy of Healthcare Power of Attorney in Chart? No - copy requested No - copy requested       Would patient like information on creating a medical advance directive?      No - Patient declined     Current Medications (verified) Outpatient Encounter Medications as of 04/13/2023  Medication Sig   atorvastatin (LIPITOR) 10 MG tablet Take 1 tablet (10 mg total) by mouth daily.   ELIQUIS 5 MG TABS tablet TAKE 1 TABLET TWICE A DAY   HYDROcodone-acetaminophen (NORCO) 10-325 MG tablet Take 1 tablet by mouth daily as needed for severe pain (pain score 7-10).   losartan (COZAAR) 50 MG tablet Take 1 tablet (50 mg total) by mouth daily.   Multiple Vitamin (MULTIVITAMIN) tablet Take 1 tablet by mouth daily.   nitroGLYCERIN (NITROSTAT) 0.4 MG SL tablet Take 1 tablet under your tongue, while sitting.  If no relief of pain may repeat NTG, one tab  every 5 minutes up to 3 tablets total over 15 minutes.  If no relief CALL 911. (Patient taking differently: as needed. Take 1 tablet under your tongue, while sitting.  If no relief of pain may repeat NTG, one tab every 5 minutes up to 3 tablets total over 15 minutes.  If no relief CALL 911.)   Omega-3 Fatty Acids (FISH OIL PO) Take 1,000 mg by mouth daily.   pantoprazole (PROTONIX) 40 MG tablet Take 1 tablet by mouth once daily   Psyllium (METAMUCIL PO) Take 15 mLs by mouth 3 (three) times daily as needed (constipation).    tamsulosin (FLOMAX) 0.4 MG CAPS capsule Take 1 capsule (0.4 mg total) by mouth in the morning and at bedtime.   triamcinolone cream (KENALOG) 0.1 % SMARTSIG:Topical 1-2 Times Daily PRN   [DISCONTINUED] citalopram (CELEXA) 10 MG tablet Take 1 tablet (10 mg total) by mouth daily.   No facility-administered encounter medications on file as of 04/13/2023.    Allergies (verified) Amiodarone hcl and Ace inhibitors   History: Past Medical History:  Diagnosis Date   ABUSE, ALCOHOL, IN REMISSION 04/08/2007   ALLERGIC RHINITIS 09/29/2006   Arthritis    "hands" (07/02/2016)   Atrial fibrillation (HCC)    BENIGN PROSTATIC HYPERTROPHY 09/29/2006   BPPV (benign paroxysmal positional vertigo) 04/08/2007  CAD (coronary artery disease)    a. LHC 2/17: EF 55-65%, LM 75, pLAD 75, oD1 75, oD2 65, D3 85, oLCx 99, oOM1 75, pRCA 25 >> CABG   Carotid stenosis    a. 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   Diverticulosis    ERECTILE DYSFUNCTION 09/29/2006   GERD (gastroesophageal reflux disease)    GLUCOSE INTOLERANCE 03/19/2010   HYPERLIPIDEMIA 09/29/2006   HYPERTENSION 09/29/2006   Long term (current) use of anticoagulants 04/26/2010   MELANOMA, MALIGNANT, SKIN NOS 09/29/2006   other skin cancers -no further melanoma   OSTEOARTHROSIS NOS, OTHER [facility] SITE 09/29/2006    Other specified forms of hearing loss 08/10/2009   Pneumonia ~ 1969   Presence of permanent cardiac pacemaker 06/02/2016   VENTRICULAR TACHYCARDIA 09/29/2006   Past Surgical History:  Procedure Laterality Date   AV NODE ABLATION N/A 07/02/2016   Procedure: AV Node Ablation;  Surgeon: Duke Salvia, MD;  Location: [facility] INVASIVE CV LAB;  Service: Cardiovascular;  Laterality: N/A;   CARDIAC CATHETERIZATION N/A 03/12/2015   Procedure: Left Heart Cath and Coronary Angiography;  Surgeon: Lyn Records, MD;  Location: [facility] INVASIVE CV LAB;  Service: Cardiovascular;  Laterality: N/A;   CARDIOVERSION N/A 12/19/2015   Procedure: CARDIOVERSION;  Surgeon: Jake Bathe, MD;  Location: [facility] ENDOSCOPY;  Service: Cardiovascular;  Laterality: N/A;   CARDIOVERSION N/A 03/24/2016   Procedure: CARDIOVERSION;  Surgeon: Lars Masson, MD;  Location: [facility] ENDOSCOPY;  Service: Cardiovascular;  Laterality: N/A;   CATARACT EXTRACTION W/ INTRAOCULAR LENS  IMPLANT, BILATERAL Bilateral    CLIPPING OF ATRIAL APPENDAGE N/A 03/14/2015   Procedure: CLIPPING OF ATRIAL APPENDAGE;  Surgeon: Loreli Slot, MD;  Location: [facility] OR;  Service: Open Heart Surgery;  Laterality: N/A;   COLONOSCOPY WITH PROPOFOL N/A 12/08/2014   Procedure: COLONOSCOPY WITH PROPOFOL;  Surgeon: Jeani Hawking, MD;  Location: [facility] ENDOSCOPY;  Service: Endoscopy;  Laterality: N/A;   CORONARY ARTERY BYPASS GRAFT N/A 03/14/2015   Procedure: CORONARY ARTERY BYPASS GRAFTING (CABG) x 4 (LIMA to LAD, SVG to DIAGONAL 2, SVG SEQUENTIALLY to OM1 and OM2);  Surgeon: Loreli Slot, MD;  Location: [facility] OR;  Service: Open Heart Surgery;  Laterality: N/A;   INCISION AND DRAINAGE N/A 07/03/2016   Procedure: INCISION AND DRAINAGE of CHEST ABSCESS;  Surgeon: Loreli Slot, MD;  Location: [facility] OR;  Service: Thoracic;  Laterality: N/A;   JOINT REPLACEMENT     KNEE ARTHROSCOPY Left    MELANOMA EXCISION     "upper back"   PACEMAKER IMPLANT N/A 06/02/2016    Procedure: Pacemaker Implant;  Surgeon: Duke Salvia, MD;  Location: [facility] INVASIVE CV LAB;  Service: Cardiovascular;  Laterality: N/A;   SHOULDER OPEN ROTATOR CUFF REPAIR Left    SKIN CANCER EXCISION Right    "cheek; real deep"   TEE WITHOUT CARDIOVERSION N/A 03/14/2015   Procedure: TRANSESOPHAGEAL ECHOCARDIOGRAM (TEE);  Surgeon: Loreli Slot, MD;  Location: [facility] OR;  Service: Open Heart Surgery;  Laterality: N/A;   TEE WITHOUT CARDIOVERSION N/A 12/19/2015   Procedure: TRANSESOPHAGEAL ECHOCARDIOGRAM (TEE);  Surgeon: Jake Bathe, MD;  Location: [facility] ENDOSCOPY;  Service: Cardiovascular;  Laterality: N/A;   TEE WITHOUT CARDIOVERSION N/A 03/24/2016   Procedure: TRANSESOPHAGEAL ECHOCARDIOGRAM (TEE);  Surgeon: Lars Masson, MD;  Location: [facility] ENDOSCOPY;  Service: Cardiovascular;  Laterality: N/A;   TONSILLECTOMY  TOTAL KNEE ARTHROPLASTY Right    Family History  Problem Relation Age of Onset   Diabetes Brother    Esophageal cancer Father    Colon cancer Neg Hx    Stomach cancer Neg Hx    Social History   Socioeconomic History   Marital status: Married    Spouse name: Not on file   Number of children: Not on file   Years of education: Not on file   Highest education level: Not on file  Occupational History   Occupation: Retired, Airline pilot  Tobacco Use   Smoking status: Former    Current packs/day: 0.00    Average packs/day: 1 pack/day for 50.0 years (50.0 ttl pk-yrs)    Types: Cigarettes    Start date: 02/04/1948    Quit date: 02/03/1998    Years since quitting: 25.2    Passive exposure: Past   Smokeless tobacco: Never   Tobacco comments:    07/02/2016 "quit before 2000"  Vaping Use   Vaping status: Never Used  Substance and Sexual Activity   Alcohol use: No    Comment: 07/02/2016 "quit before 2000"   Drug use: No   Sexual activity: Never  Other Topics Concern   Not on file  Social History Narrative   Married, 3 children. Retired Airline pilot. Lives in [place] with  wife and kids.  He is retired from Airline pilot.    Social Drivers of Corporate investment banker Strain: Low Risk  (04/13/2023)   Overall Financial Resource Strain (CARDIA)    Difficulty of Paying Living Expenses: Not hard at all  Food Insecurity: No Food Insecurity (04/13/2023)   Hunger Vital Sign    Worried About Running Out of Food in the Last Year: Never true    Ran Out of Food in the Last Year: Never true  Transportation Needs: No Transportation Needs (04/13/2023)   PRAPARE - Administrator, Civil Service (Medical): No    Lack of Transportation (Non-Medical): No  Physical Activity: Insufficiently Active (04/13/2023)   Exercise Vital Sign    Days of Exercise per Week: 3 days    Minutes of Exercise per Session: 10 min  Stress: No Stress Concern Present (04/13/2023)   [employer] of Occupational Health - Occupational Stress Questionnaire    Feeling of Stress : Not at all  Social Connections: Socially Isolated (04/13/2023)   Social Connection and Isolation Panel [NHANES]    Frequency of Communication with Friends and Family: Never    Frequency of Social Gatherings with Friends and Family: Never    Attends Religious Services: Never    Database administrator or Organizations: No    Attends Banker Meetings: Never    Marital Status: Married    Tobacco Counseling Counseling given: Yes Tobacco comments: 07/02/2016 "quit before 2000"    Clinical Intake:  Pre-visit preparation completed: Yes  Pain : No/denies pain     BMI - recorded: 25.62 Nutritional Status: BMI 25 -29 Overweight Nutritional Risks: None Diabetes: No  How often do you need to have someone help you when you read instructions, pamphlets, or other written materials from your doctor or pharmacy?: 1 - Never  Interpreter Needed?: No  Information entered by :: Hassell Halim, CMA   Activities of Daily Living     04/13/2023    2:10 PM  In your present state of health, do you have any difficulty  performing the following activities:  Hearing? 1  Comment wears hearing aids but has build  up of wax often - sees Dr Pollyann Kennedy (ENT)  Vision? 0  Difficulty concentrating or making decisions? 0  Walking or climbing stairs? 0  Dressing or bathing? 0  Doing errands, shopping? 0  Preparing Food and eating ? N  Using the Toilet? N  In the past six months, have you accidently leaked urine? N  Do you have problems with loss of bowel control? N  Managing your Medications? N  Managing your Finances? N  Housekeeping or managing your Housekeeping? N    Patient Care Team: Corwin Levins, MD as PCP - General (Internal Medicine) Corky Crafts, MD as PCP - Cardiology (Cardiology) Duke Salvia, MD as PCP - Electrophysiology (Cardiology) Maris Berger, MD as Consulting Physician (Ophthalmology) Nita Sells, MD as Consulting Physician (Dermatology) Serena Colonel, MD as Consulting Physician (Otolaryngology)  Indicate any recent Medical Services you may have received from other than [facility] providers in the past year (date may be approximate).     Assessment:   This is a routine wellness examination for Jlen.  Hearing/Vision screen Hearing Screening - Comments:: Wears hearing aids &amp; sees ENT (Dr Pollyann Kennedy) Vision Screening - Comments:: Wears rx glasses - up to date with routine eye exams with Dr Charlotte Sanes   Goals Addressed               This Visit's Progress     Patient Stated (pt-stated)        Patient stated that he'd like to lose weight (about 20lbs).       Depression Screen     04/13/2023    2:18 PM 12/17/2022    1:08 PM 11/11/2022    1:34 PM 08/14/2022    2:31 PM 03/04/2022    1:32 PM 03/04/2022    1:12 PM 11/19/2021    1:13 PM  PHQ 2/9 Scores  PHQ - 2 Score 0 0 0 0 0 0 0  PHQ- 9 Score 0     0     Fall Risk     04/13/2023    2:20 PM 12/17/2022    1:07 PM 11/11/2022    1:34 PM 08/14/2022    2:30 PM 03/04/2022    1:32 PM  Fall Risk   Falls in the past year? 0 0 0 0 0   Number falls in past yr: 0 0 0 0 0  Injury with Fall? 0 0 0 0 0  Risk for fall due to : No Fall Risks No Fall Risks No Fall Risks No Fall Risks   Follow up Falls prevention discussed;Falls evaluation completed Falls evaluation completed Falls evaluation completed Falls evaluation completed     MEDICARE RISK AT HOME:  Medicare Risk at Home Any stairs in or around the home?: Yes (inside/outside) If so, are there any without handrails?: No Home free of loose throw rugs in walkways, pet beds, electrical cords, etc?: Yes Adequate lighting in your home to reduce risk of falls?: Yes Life alert?: Yes Use of a cane, walker or w/c?: Yes (cane) Grab bars in the bathroom?: Yes Shower chair or bench in shower?: Yes Elevated toilet seat or a handicapped toilet?: Yes  TIMED UP AND GO:  Was the test performed?  No  Cognitive Function: 6CIT completed        04/13/2023    2:21 PM 11/19/2021    1:23 PM  6CIT Screen  What Year? 0 points 0 points  What month? 0 points 0 points  What time? 0 points 0 points  Count back from 20 0 points 0 points  Months in reverse 2 points 0 points  Repeat phrase 0 points 0 points  Total Score 2 points 0 points    Immunizations Immunization History  Administered Date(s) Administered   Fluad Quad(high Dose 65+) 10/08/2018, 11/10/2019, 10/19/2020, 10/30/2021, 11/07/2022   H1N1 03/14/2008   Influenza Split 11/11/2010, 10/10/2011   Influenza Whole 01/20/2007, 11/02/2007, 03/16/2009, 11/16/2009   Influenza, High Dose Seasonal PF 10/21/2013, 11/20/2015, 11/25/2016, 11/17/2017   Influenza,inj,Quad PF,6+ Mos 11/09/2012   Influenza-Unspecified 11/24/2014   PFIZER Comirnaty(Gray Top)Covid-19 Tri-Sucrose Vaccine 08/21/2020, 10/30/2021   PFIZER(Purple Top)SARS-COV-2 Vaccination 03/17/2019, 04/11/2019, 11/22/2019   Pfizer Covid-19 Vaccine Bivalent Booster 29yrs & up 01/10/2021, 11/07/2022   Pneumococcal Conjugate-13 05/24/2013   Pneumococcal Polysaccharide-23  03/31/2006   Td 03/31/2006   Tdap 06/19/2016    Screening Tests Health Maintenance  Topic Date Due   Zoster Vaccines- Shingrix (1 of 2) Never done   COVID-19 Vaccine (8 - 2024-25 season) 01/02/2023   Medicare Annual Wellness (AWV)  04/12/2024   DTaP/Tdap/Td (3 - Td or Tdap) 06/20/2026   Pneumonia Vaccine 41+ Years old  Completed   INFLUENZA VACCINE  Completed   HPV VACCINES  Aged Out    Health Maintenance  Health Maintenance Due  Topic Date Due   Zoster Vaccines- Shingrix (1 of 2) Never done   COVID-19 Vaccine (8 - 2024-25 season) 01/02/2023   Health Maintenance Items Addressed:04/13/2023   Additional Screening:  Vision Screening: Recommended annual ophthalmology exams for early detection of glaucoma and other disorders of the eye. Pt sees Dr Charlotte Sanes every 2 years for a routine eye exam.  Pt sees Dr Pollyann Kennedy (ENT) for cleaning of ears 1-3x a year to assist with using hearing aids.  Dental Screening: Recommended annual dental exams for proper oral hygiene  Community Resource Referral / Chronic Care Management: CRR required this visit?  No   CCM required this visit?  No     Plan:     I have personally reviewed and noted the following in the patient's chart:   Medical and social history Use of alcohol, tobacco or illicit drugs  Current medications and supplements including opioid prescriptions. Patient is currently taking opioid prescriptions. Information provided to patient regarding non-opioid alternatives. Patient advised to discuss non-opioid treatment plan with their provider. Functional ability and status Nutritional status Physical activity Advanced directives List of other physicians Hospitalizations, surgeries, and ER visits in previous 12 months Vitals Screenings to include cognitive, depression, and falls Referrals and appointments  In addition, I have reviewed and discussed with patient certain preventive protocols, quality metrics, and best practice  recommendations. A written personalized care plan for preventive services as well as general preventive health recommendations were provided to patient.     Darreld Mclean, CMA   04/13/2023   After Visit Summary: (MyChart) Due to this being a telephonic visit, the after visit summary with patients personalized plan was offered to patient via MyChart   Notes: Nothing significant to report at this time.

## 2023-04-13 NOTE — Patient Instructions (Addendum)
 Jonathan Orozco , Thank you for taking time to come for your Medicare Wellness Visit. I appreciate your ongoing commitment to your health goals. Please review the following plan we discussed and let me know if I can assist you in the future.   Referrals/Orders/Follow-Ups/Clinician Recommendations: Aim for 30 minutes of exercise or brisk walking, 6-8 glasses of water, and 5 servings of fruits and vegetables each day.   This is a list of the screening recommended for you and due dates:  Health Maintenance  Topic Date Due   Zoster (Shingles) Vaccine (1 of 2) Never done   COVID-19 Vaccine (8 - 2024-25 season) 01/02/2023   Medicare Annual Wellness Visit  04/12/2024   DTaP/Tdap/Td vaccine (3 - Td or Tdap) 06/20/2026   Pneumonia Vaccine  Completed   Flu Shot  Completed   HPV Vaccine  Aged Out    Advanced directives: (Copy Requested) Please bring a copy of your health care power of attorney and living will to the office to be added to your chart at your convenience. You can mail to [facility] [address] [place], [place] [address] or email to [email or web address]  Next Medicare Annual Wellness Visit scheduled for next year: Yes   Managing Pain Without Opioids Opioids are strong medicines used to treat moderate to severe pain. For some people, especially those who have long-term (chronic) pain, opioids may not be the best choice for pain management due to: Side effects like nausea, constipation, and sleepiness. The risk of addiction (opioid use disorder). The longer you take opioids, the greater your risk of addiction. Pain that lasts for more than 3 months is called chronic pain. Managing chronic pain usually requires more than one approach and is often provided by a team of health care providers working together (multidisciplinary approach). Pain management may be done at a pain management center or pain clinic. How to manage pain without the use of opioids Use non-opioid  medicines Non-opioid medicines for pain may include: Over-the-counter or prescription non-steroidal anti-inflammatory drugs (NSAIDs). These may be the first medicines used for pain. They work well for muscle and bone pain, and they reduce swelling. Acetaminophen. This over-the-counter medicine may work well for milder pain but not swelling. Antidepressants. These may be used to treat chronic pain. A certain type of antidepressant (tricyclics) is often used. These medicines are given in lower doses for pain than when used for depression. Anticonvulsants. These are usually used to treat seizures but may also reduce nerve (neuropathic) pain. Muscle relaxants. These relieve pain caused by sudden muscle tightening (spasms). You may also use a pain medicine that is applied to the skin as a patch, cream, or gel (topical analgesic), such as a numbing medicine. These may cause fewer side effects than medicines taken by mouth. Do certain therapies as directed Some therapies can help with pain management. They include: Physical therapy. You will do exercises to gain strength and flexibility. A physical therapist may teach you exercises to move and stretch parts of your body that are weak, stiff, or painful. You can learn these exercises at physical therapy visits and practice them at home. Physical therapy may also involve: Massage. Heat wraps or applying heat or cold to affected areas. Electrical signals that interrupt pain signals (transcutaneous electrical nerve stimulation, TENS). Weak lasers that reduce pain and swelling (low-level laser therapy). Signals from your body that help you learn to regulate pain (biofeedback). Occupational therapy. This helps you to learn ways to function at home  and work with less pain. Recreational therapy. This involves trying new activities or hobbies, such as a physical activity or drawing. Mental health therapy, including: Cognitive behavioral therapy (CBT). This helps  you learn coping skills for dealing with pain. Acceptance and commitment therapy (ACT) to change the way you think and react to pain. Relaxation therapies, including muscle relaxation exercises and mindfulness-based stress reduction. Pain management counseling. This may be individual, family, or group counseling.  Receive medical treatments Medical treatments for pain management include: Nerve block injections. These may include a pain blocker and anti-inflammatory medicines. You may have injections: Near the spine to relieve chronic back or neck pain. Into joints to relieve back or joint pain. Into nerve areas that supply a painful area to relieve body pain. Into muscles (trigger point injections) to relieve some painful muscle conditions. A medical device placed near your spine to help block pain signals and relieve nerve pain or chronic back pain (spinal cord stimulation device). Acupuncture. Follow these instructions at home Medicines Take over-the-counter and prescription medicines only as told by your health care provider. If you are taking pain medicine, ask your health care providers about possible side effects to watch out for. Do not drive or use heavy machinery while taking prescription opioid pain medicine. Lifestyle  Do not use drugs or alcohol to reduce pain. If you drink alcohol, limit how much you have to: 0-1 drink a day for women who are not pregnant. 0-2 drinks a day for men. Know how much alcohol is in a drink. In the [place] one drink equals one 12 oz bottle of beer (355 mL), one 5 oz glass of wine (148 mL), or one 1 oz glass of hard liquor (44 mL). Do not use any products that contain nicotine or tobacco. These products include cigarettes, chewing tobacco, and vaping devices, such as e-cigarettes. If you need help quitting, ask your health care provider. Eat a healthy diet and maintain a healthy weight. Poor diet and excess weight may make pain worse. Eat foods that  are high in fiber. These include fresh fruits and vegetables, whole grains, and beans. Limit foods that are high in fat and processed sugars, such as fried and sweet foods. Exercise regularly. Exercise lowers stress and may help relieve pain. Ask your health care provider what activities and exercises are safe for you. If your health care provider approves, join an exercise class that combines movement and stress reduction. Examples include yoga and tai chi. Get enough sleep. Lack of sleep may make pain worse. Lower stress as much as possible. Practice stress reduction techniques as told by your therapist. General instructions Work with all your pain management providers to find the treatments that work best for you. You are an important member of your pain management team. There are many things you can do to reduce pain on your own. Consider joining an online or in-person support group for people who have chronic pain. Keep all follow-up visits. This is important. Where to find more information You can find more information about managing pain without opioids from: American Academy of Pain Medicine: painmed.org Institute for Chronic Pain: instituteforchronicpain.org American Chronic Pain Association: theacpa.org Contact a health care provider if: You have side effects from pain medicine. Your pain gets worse or does not get better with treatments or home therapy. You are struggling with anxiety or depression. Summary Many types of pain can be managed without opioids. Chronic pain may respond better to pain management without opioids. Pain is  best managed when you and a team of health care providers work together. Pain management without opioids may include non-opioid medicines, medical treatments, physical therapy, mental health therapy, and lifestyle changes. Tell your health care providers if your pain gets worse or is not being managed well enough. This information is not intended to  replace advice given to you by your health care provider. Make sure you discuss any questions you have with your health care provider. Document Revised: 05/02/2020 Document Reviewed: 05/02/2020 Elsevier Patient Education  2024 [employer].

## 2023-04-20 NOTE — Progress Notes (Signed)
 HPI: Follow-up atrial fibrillation.  Previously followed by Dr. Eldridge Dace now transitioning to me.  Cardiac catheterization February 2017 showed 75% left main, 99% ostial circumflex, 75% first marginal, 75% LAD, 75% first diagonal and 85% third diagonal; ejection fraction 50%.  Patient underwent coronary bypass and graft February 2017 with LIMA to the LAD, saphenous vein graft to the second diagonal, OM1 and OM 2; also with left atrial appendage clipping).  Transesophageal echocardiogram February 2019 showed normal LV function, mild to moderate mitral regurgitation and mild tricuspid regurgitation.  Patient is also status post AV node ablation with pacemaker placement.  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  Since last seen he denies dyspnea, chest pain, palpitations or syncope.  No bleeding.  Current Outpatient Medications  Medication Sig Dispense Refill   atorvastatin (LIPITOR) 10 MG tablet Take 1 tablet (10 mg total) by mouth daily. 90 tablet 3   ELIQUIS 5 MG TABS tablet TAKE 1 TABLET TWICE A DAY 180 tablet 1   HYDROcodone-acetaminophen (NORCO) 10-325 MG tablet Take 1 tablet by mouth daily as needed for severe pain (pain score 7-10). 30 tablet 0   losartan (COZAAR) 50 MG tablet Take 1 tablet (50 mg total) by mouth daily. 90 tablet 3   Multiple Vitamin (MULTIVITAMIN) tablet Take 1 tablet by mouth daily.     nitroGLYCERIN (NITROSTAT) 0.4 MG SL tablet Take 1 tablet under your tongue, while sitting.  If no relief of pain may repeat NTG, one tab every 5 minutes up to 3 tablets total over 15 minutes.  If no relief CALL 911. (Patient taking differently: as needed. Take 1 tablet under your tongue, while sitting.  If no relief of pain may repeat NTG, one tab every 5 minutes up to 3 tablets total over 15 minutes.  If no relief CALL 911.) 25 tablet 3   Omega-3 Fatty Acids (FISH OIL PO) Take 1,000 mg by mouth daily.     pantoprazole (PROTONIX) 40 MG  tablet Take 1 tablet by mouth once daily (Patient taking differently: Take 40 mg by mouth as needed.) 90 tablet 3   Psyllium (METAMUCIL PO) Take 15 mLs by mouth 3 (three) times daily as needed (constipation).      tamsulosin (FLOMAX) 0.4 MG CAPS capsule Take 1 capsule (0.4 mg total) by mouth in the morning and at bedtime. 180 capsule 3   triamcinolone cream (KENALOG) 0.1 % SMARTSIG:Topical 1-2 Times Daily PRN     No current facility-administered medications for this visit.     Past Medical History:  Diagnosis Date   ABUSE, ALCOHOL, IN REMISSION 04/08/2007   ALLERGIC RHINITIS 09/29/2006   Arthritis    "hands" (07/02/2016)   Atrial fibrillation (HCC)    BENIGN PROSTATIC HYPERTROPHY 09/29/2006   BPPV (benign paroxysmal positional vertigo) 04/08/2007   CAD (coronary artery disease)    a. LHC 2/17: EF 55-65%, LM 75, pLAD 75, oD1 75, oD2 65, D3 85, oLCx 99, oOM1 75, pRCA 25 >> CABG   Carotid stenosis    a. 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   Diverticulosis    ERECTILE DYSFUNCTION 09/29/2006   GERD (gastroesophageal reflux disease)    GLUCOSE INTOLERANCE 03/19/2010   HYPERLIPIDEMIA 09/29/2006   HYPERTENSION 09/29/2006   Long term (current) use of anticoagulants 04/26/2010   MELANOMA, MALIGNANT, SKIN NOS  09/29/2006   other skin cancers -no further melanoma   OSTEOARTHROSIS NOS, OTHER [facility] SITE 09/29/2006   Other specified forms of hearing loss 08/10/2009   Pneumonia ~ 1969   Presence of permanent cardiac pacemaker 06/02/2016   VENTRICULAR TACHYCARDIA 09/29/2006    Past Surgical History:  Procedure Laterality Date   AV NODE ABLATION N/A 07/02/2016   Procedure: AV Node Ablation;  Surgeon: Duke Salvia, MD;  Location: [facility] INVASIVE CV LAB;  Service: Cardiovascular;  Laterality: N/A;   CARDIAC CATHETERIZATION N/A 03/12/2015   Procedure: Left Heart Cath and Coronary  Angiography;  Surgeon: Lyn Records, MD;  Location: [facility] INVASIVE CV LAB;  Service: Cardiovascular;  Laterality: N/A;   CARDIOVERSION N/A 12/19/2015   Procedure: CARDIOVERSION;  Surgeon: Jake Bathe, MD;  Location: [facility] ENDOSCOPY;  Service: Cardiovascular;  Laterality: N/A;   CARDIOVERSION N/A 03/24/2016   Procedure: CARDIOVERSION;  Surgeon: Lars Masson, MD;  Location: [facility] ENDOSCOPY;  Service: Cardiovascular;  Laterality: N/A;   CATARACT EXTRACTION W/ INTRAOCULAR LENS  IMPLANT, BILATERAL Bilateral    CLIPPING OF ATRIAL APPENDAGE N/A 03/14/2015   Procedure: CLIPPING OF ATRIAL APPENDAGE;  Surgeon: Loreli Slot, MD;  Location: [facility] OR;  Service: Open Heart Surgery;  Laterality: N/A;   COLONOSCOPY WITH PROPOFOL N/A 12/08/2014   Procedure: COLONOSCOPY WITH PROPOFOL;  Surgeon: Jeani Hawking, MD;  Location: [facility] ENDOSCOPY;  Service: Endoscopy;  Laterality: N/A;   CORONARY ARTERY BYPASS GRAFT N/A 03/14/2015   Procedure: CORONARY ARTERY BYPASS GRAFTING (CABG) x 4 (LIMA to LAD, SVG to DIAGONAL 2, SVG SEQUENTIALLY to OM1 and OM2);  Surgeon: Loreli Slot, MD;  Location: [facility] OR;  Service: Open Heart Surgery;  Laterality: N/A;   INCISION AND DRAINAGE N/A 07/03/2016   Procedure: INCISION AND DRAINAGE of CHEST ABSCESS;  Surgeon: Loreli Slot, MD;  Location: [facility] OR;  Service: Thoracic;  Laterality: N/A;   JOINT REPLACEMENT     KNEE ARTHROSCOPY Left    MELANOMA EXCISION     "upper back"   PACEMAKER IMPLANT N/A 06/02/2016   Procedure: Pacemaker Implant;  Surgeon: Duke Salvia, MD;  Location: [facility] INVASIVE CV LAB;  Service: Cardiovascular;  Laterality: N/A;   SHOULDER OPEN ROTATOR CUFF REPAIR Left    SKIN CANCER EXCISION Right    "cheek; real deep"   TEE WITHOUT CARDIOVERSION N/A 03/14/2015   Procedure: TRANSESOPHAGEAL ECHOCARDIOGRAM (TEE);  Surgeon: Loreli Slot, MD;  Location: [facility] OR;  Service: Open Heart Surgery;  Laterality: N/A;   TEE WITHOUT CARDIOVERSION N/A 12/19/2015    Procedure: TRANSESOPHAGEAL ECHOCARDIOGRAM (TEE);  Surgeon: Jake Bathe, MD;  Location: [facility] ENDOSCOPY;  Service: Cardiovascular;  Laterality: N/A;   TEE WITHOUT CARDIOVERSION N/A 03/24/2016   Procedure: TRANSESOPHAGEAL ECHOCARDIOGRAM (TEE);  Surgeon: Lars Masson, MD;  Location: [facility] ENDOSCOPY;  Service: Cardiovascular;  Laterality: N/A;   TONSILLECTOMY     TOTAL KNEE ARTHROPLASTY Right     Social History   Socioeconomic History   Marital status: Married    Spouse name: Not on file   Number of children: Not on file   Years of education: Not on file   Highest education level: Not on file  Occupational History   Occupation: Retired, Airline pilot  Tobacco Use   Smoking status: Former    Current packs/day: 0.00    Average packs/day: 1 pack/day for 50.0 years (50.0 ttl pk-yrs)    Types: Cigarettes    Start date: 02/04/1948    Quit date: 02/03/1998    Years since  quitting: 25.2    Passive exposure: Past   Smokeless tobacco: Never   Tobacco comments:    07/02/2016 "quit before 2000"  Vaping Use   Vaping status: Never Used  Substance and Sexual Activity   Alcohol use: No    Comment: 07/02/2016 "quit before 2000"   Drug use: No   Sexual activity: Never  Other Topics Concern   Not on file  Social History Narrative   Married, 3 children. Retired Airline pilot. Lives in [place] with  wife and kids. He is retired from Airline pilot.    Social Drivers of Corporate investment banker Strain: Low Risk  (04/13/2023)   Overall Financial Resource Strain (CARDIA)    Difficulty of Paying Living Expenses: Not hard at all  Food Insecurity: No Food Insecurity (04/13/2023)   Hunger Vital Sign    Worried About Running Out of Food in the Last Year: Never true    Ran Out of Food in the Last Year: Never true  Transportation Needs: No Transportation Needs (04/13/2023)   PRAPARE - Administrator, Civil Service (Medical): No    Lack of Transportation (Non-Medical): No  Physical Activity: Insufficiently Active  (04/13/2023)   Exercise Vital Sign    Days of Exercise per Week: 3 days    Minutes of Exercise per Session: 10 min  Stress: No Stress Concern Present (04/13/2023)   [employer] of Occupational Health - Occupational Stress Questionnaire    Feeling of Stress : Not at all  Social Connections: Socially Isolated (04/13/2023)   Social Connection and Isolation Panel [NHANES]    Frequency of Communication with Friends and Family: Never    Frequency of Social Gatherings with Friends and Family: Never    Attends Religious Services: Never    Database administrator or Organizations: No    Attends Banker Meetings: Never    Marital Status: Married  Catering manager Violence: Not At Risk (04/13/2023)   Humiliation, Afraid, Rape, and Kick questionnaire    Fear of Current or Ex-Partner: No    Emotionally Abused: No    Physically Abused: No    Sexually Abused: No    Family History  Problem Relation Age of Onset   Diabetes Brother    Esophageal cancer Father    Colon cancer Neg Hx    Stomach cancer Neg Hx     ROS: no fevers or chills, productive cough, hemoptysis, dysphasia, odynophagia, melena, hematochezia, dysuria, hematuria, rash, seizure activity, orthopnea, PND, pedal edema, claudication. Remaining systems are negative.  Physical Exam: Well-developed well-nourished in no acute distress.  Skin is warm and dry.  HEENT is normal.  Neck is supple.  Chest is clear to auscultation with normal expansion.  Cardiovascular exam is regular rate and rhythm.  Abdominal exam nontender or distended. No masses palpated. Extremities show no edema. neuro grossly intact   A/P  1 coronary artery disease-continue statin.  He is not on aspirin given need for apixaban.  Note 15 minutes spent reviewing patient's chart prior to his arrival on previous records.  2 permanent atrial fibrillation-patient is status post AV node ablation.  Continue apixaban.  3 hypertension-blood pressure  controlled.  Continue present medications.  4 history of pacemaker-Per EP.  5 chronic diastolic congestive heart failure-patient is euvolemic.  Olga Millers, MD

## 2023-04-29 ENCOUNTER — Encounter: Payer: Self-pay | Admitting: Cardiology

## 2023-04-29 ENCOUNTER — Ambulatory Visit: Payer: Medicare Other | Attending: Cardiology | Admitting: Cardiology

## 2023-04-29 VITALS — BP 122/72 | HR 78 | Ht 70.0 in | Wt 178.6 lb

## 2023-04-29 DIAGNOSIS — I2510 Atherosclerotic heart disease of native coronary artery without angina pectoris: Secondary | ICD-10-CM | POA: Diagnosis not present

## 2023-04-29 DIAGNOSIS — I10 Essential (primary) hypertension: Secondary | ICD-10-CM

## 2023-04-29 DIAGNOSIS — I4819 Other persistent atrial fibrillation: Secondary | ICD-10-CM | POA: Diagnosis not present

## 2023-04-29 DIAGNOSIS — Z950 Presence of cardiac pacemaker: Secondary | ICD-10-CM | POA: Diagnosis not present

## 2023-04-29 NOTE — Patient Instructions (Signed)
    Follow-Up: At [facility], you and your health needs are our priority.  As part of our continuing mission to provide you with exceptional heart care, we have created designated Provider Care Teams.  These Care Teams include your primary Cardiologist (physician) and Advanced Practice Providers (APPs -  Physician Assistants and Nurse Practitioners) who all work together to provide you with the care you need, when you need it.   Your next appointment:   12 month(s)  Provider:   Olga Millers MD

## 2023-05-08 ENCOUNTER — Other Ambulatory Visit: Payer: Self-pay | Admitting: Internal Medicine

## 2023-05-08 MED ORDER — HYDROCODONE-ACETAMINOPHEN 10-325 MG PO TABS: 1.0000 | ORAL_TABLET | Freq: Every day | ORAL | 0 refills | Status: DC | PRN

## 2023-05-18 ENCOUNTER — Ambulatory Visit: Payer: Medicare Other

## 2023-05-18 DIAGNOSIS — I4819 Other persistent atrial fibrillation: Secondary | ICD-10-CM | POA: Diagnosis not present

## 2023-05-20 LAB — CUP PACEART REMOTE DEVICE CHECK
Battery Remaining Longevity: 66 mo
Battery Voltage: 2.97 V
Brady Statistic RV Percent Paced: 99.8 %
Date Time Interrogation Session: 20250414071750
Implantable Lead Connection Status: 753985
Implantable Lead Implant Date: 20180430
Implantable Lead Location: 753860
Implantable Lead Model: 5076
Implantable Pulse Generator Implant Date: 20180430
Lead Channel Impedance Value: 361 Ohm
Lead Channel Impedance Value: 532 Ohm
Lead Channel Pacing Threshold Amplitude: 0.75 V
Lead Channel Pacing Threshold Pulse Width: 0.4 ms
Lead Channel Sensing Intrinsic Amplitude: 4.875 mV
Lead Channel Sensing Intrinsic Amplitude: 5.625 mV
Lead Channel Setting Pacing Amplitude: 2.5 V
Lead Channel Setting Pacing Pulse Width: 0.4 ms
Lead Channel Setting Sensing Sensitivity: 1.2 mV
Zone Setting Status: 755011

## 2023-05-28 ENCOUNTER — Encounter: Payer: Self-pay | Admitting: Internal Medicine

## 2023-05-29 ENCOUNTER — Telehealth: Payer: Self-pay | Admitting: Internal Medicine

## 2023-05-29 NOTE — Telephone Encounter (Signed)
 Inbound call from patients wife requesting a call to discuss if patient can have a sigmoidoscopy. Requesting a call back to discuss. Please advise.

## 2023-05-29 NOTE — Telephone Encounter (Signed)
 Left message for pt to call back.  Pts wife called and states her husband was told he could not have a colonoscopy due to his age/condition. He wants to know if he might be able to have a flex-sig.

## 2023-06-01 NOTE — Telephone Encounter (Signed)
 Left message for patient to call back. He should have office visit for any GI concerns. He was last seen 2023.

## 2023-06-02 NOTE — Telephone Encounter (Signed)
 I have spoken to patient's wife (caller)who states that patient has been told previously that he is not a candidate for colonoscopy but she wonders if he may be a candidate for flexible sigmoidoscopy. When questioned about GI symptoms, wife says that patient has alternating constipation with abdominal pain followed by diarrhea.   Patient has been scheduled to see Dr Elvin Hammer on 07/21/23 (prefers to see Dr Elvin Hammer vs extender with earlier appointment) to discuss bowel habits changes. Wife verbalizes understanding.

## 2023-06-08 ENCOUNTER — Other Ambulatory Visit: Payer: Self-pay | Admitting: Internal Medicine

## 2023-06-08 MED ORDER — HYDROCODONE-ACETAMINOPHEN 10-325 MG PO TABS: 1.0000 | ORAL_TABLET | Freq: Every day | ORAL | 0 refills | Status: DC | PRN

## 2023-07-02 ENCOUNTER — Ambulatory Visit: Admitting: Physician Assistant

## 2023-07-09 ENCOUNTER — Other Ambulatory Visit: Payer: Self-pay | Admitting: Internal Medicine

## 2023-07-09 MED ORDER — HYDROCODONE-ACETAMINOPHEN 10-325 MG PO TABS: 1.0000 | ORAL_TABLET | Freq: Every day | ORAL | 0 refills | Status: DC | PRN

## 2023-07-09 NOTE — Telephone Encounter (Signed)
 Copied from CRM [id number]. Topic: Clinical - Medication Refill >> Jul 09, 2023  3:39 PM Alyse July wrote: Medication: HYDROcodone -acetaminophen  (NORCO) 10-325 MG tablet  Has the patient contacted their pharmacy? Yes  This is the patient's preferred pharmacy:  [facility] [address], [place] - [address] [place] [address] Phone: [phone] Fax: [phone]  Is this the correct pharmacy for this prescription? Yes If no, delete pharmacy and type the correct one.   Has the prescription been filled recently? No  Is the patient out of the medication? No  Has the patient been seen for an appointment in the last year OR does the patient have an upcoming appointment? Yes  Can we respond through MyChart? No  Agent: Please be advised that Rx refills may take up to 3 business days. We ask that you follow-up with your pharmacy.

## 2023-07-09 NOTE — Progress Notes (Signed)
 Remote pacemaker transmission.

## 2023-07-09 NOTE — Addendum Note (Signed)
 Addended by: Edra Govern D on: 07/09/2023 02:08 PM   Modules accepted: Orders

## 2023-07-20 ENCOUNTER — Other Ambulatory Visit: Payer: Self-pay

## 2023-07-20 DIAGNOSIS — Z7901 Long term (current) use of anticoagulants: Secondary | ICD-10-CM

## 2023-07-20 DIAGNOSIS — I4891 Unspecified atrial fibrillation: Secondary | ICD-10-CM

## 2023-07-20 MED ORDER — ELIQUIS 5 MG PO TABS: 5.0000 mg | ORAL_TABLET | Freq: Two times a day (BID) | ORAL | 1 refills | Status: DC

## 2023-07-20 NOTE — Telephone Encounter (Signed)
 Prescription refill request for Eliquis  received. Indication:afib Last office visit:3/25 Scr:1.16  5/25 Age: 89 Weight:81  kg  Prescription refilled

## 2023-07-21 ENCOUNTER — Ambulatory Visit: Admitting: Internal Medicine

## 2023-07-24 ENCOUNTER — Encounter: Payer: Self-pay | Admitting: Podiatry

## 2023-07-24 ENCOUNTER — Ambulatory Visit (INDEPENDENT_AMBULATORY_CARE_PROVIDER_SITE_OTHER)

## 2023-07-24 ENCOUNTER — Ambulatory Visit: Admitting: Podiatry

## 2023-07-24 DIAGNOSIS — M778 Other enthesopathies, not elsewhere classified: Secondary | ICD-10-CM | POA: Diagnosis not present

## 2023-07-24 DIAGNOSIS — M25571 Pain in right ankle and joints of right foot: Secondary | ICD-10-CM

## 2023-07-24 DIAGNOSIS — M7671 Peroneal tendinitis, right leg: Secondary | ICD-10-CM | POA: Diagnosis not present

## 2023-07-24 MED ORDER — TRIAMCINOLONE ACETONIDE 10 MG/ML IJ SUSP
10.0000 mg | Freq: Once | INTRAMUSCULAR | Status: AC
  Administered 2023-07-24: 10 mg

## 2023-07-24 NOTE — Progress Notes (Signed)
 With complaint of pain around the ankle.  The lateral ankle right.  Began several weeks ago has been getting worse hurts most when he is walking he lives with his foot during gait.  Sometimes aches a little bit when he is sitting has noticed any redness or ecchymosis.  Does not recall any specific injury   Physical exam:  General appearance: Pleasant, and in no acute distress. AOx3.  Vascular: Pedal pulses: DP 2/4 bilaterally, PT 1/4 bilaterally.  Mild edema lower legs bilaterally. Capillary fill time immediate.  Neurological: Light touch intact feet bilaterally.  Normal Achilles reflex bilaterally.  No clonus or spasticity noted.  Negative Tinel sign sural nerve right  Dermatologic:   Skin normal temperature bilaterally.  Thin somewhat thin and atrophic with no hair growth lower extremity bilaterally   musculoskeletal: Tenderness at sinus tarsi and with range of motion subtalar joint.  Tenderness along the peroneus brevis and longus especially at the peroneal tubercle.  +5 or +5 muscle strength in eversion right.  Radiographs: 3 views right foot: Nones any fractures or dislocations.  Calcified vessels noted in foot.  Osteophytic changes plantar and posterior aspect calcaneus.  No erosive changes noted.  Diagnosis: 1.  Arthralgia subtalar joint right. 2.  Peroneal tendinitis right. 3.  Capsulitis foot right.  Plan: -New patient visit office level 3 for evaluation and management modifier 25. - Discussed with him the pain along the tendon and the subtalar joint.  Recommended wearing good supportive shoe.  OTC NSAIDs - RICE -injected 3cc 2:1 mixture 0.5 cc Marcaine :Kenolog 10mg /11ml at Peroneal tendon sheaths of longus and brevis at the peroneal tubercle right   Return 2 weeks follow-up injection peroneal tendon sheath RT

## 2023-08-11 ENCOUNTER — Telehealth: Payer: Self-pay | Admitting: Internal Medicine

## 2023-08-11 ENCOUNTER — Ambulatory Visit: Admitting: Podiatry

## 2023-08-11 MED ORDER — HYDROCODONE-ACETAMINOPHEN 10-325 MG PO TABS: 1.0000 | ORAL_TABLET | Freq: Every day | ORAL | 0 refills | Status: DC | PRN

## 2023-08-11 NOTE — Telephone Encounter (Signed)
 Copied from CRM [id number]. Topic: Clinical - Medication Refill >> Aug 11, 2023  3:42 PM [place] M wrote: Medication: HYDROcodone -acetaminophen  (NORCO) 10-325 MG tablet  Has the patient contacted their pharmacy? Yes (Agent: If no, request that the patient contact the pharmacy for the refill. If patient does not wish to contact the pharmacy document the reason why and proceed with request.) (Agent: If yes, when and what did the pharmacy advise?)  This is the patient's preferred pharmacy:  [facility] [address], [place] - [address] [place] [address] Phone: [phone] Fax: [phone]   Is this the correct pharmacy for this prescription? Yes If no, delete pharmacy and type the correct one.   Has the prescription been filled recently? Yes  Is the patient out of the medication? Yes  Has the patient been seen for an appointment in the last year OR does the patient have an upcoming appointment? Yes  Can we respond through MyChart? Yes  Agent: Please be advised that Rx refills may take up to 3 business days. We ask that you follow-up with your pharmacy.

## 2023-08-17 ENCOUNTER — Ambulatory Visit: Payer: Medicare Other

## 2023-08-17 DIAGNOSIS — I4819 Other persistent atrial fibrillation: Secondary | ICD-10-CM | POA: Diagnosis not present

## 2023-08-18 LAB — CUP PACEART REMOTE DEVICE CHECK
Battery Remaining Longevity: 61 mo
Battery Voltage: 2.96 V
Brady Statistic RV Percent Paced: 97.32 %
Date Time Interrogation Session: 20250714083304
Implantable Lead Connection Status: 753985
Implantable Lead Implant Date: 20180430
Implantable Lead Location: 753860
Implantable Lead Model: 5076
Implantable Pulse Generator Implant Date: 20180430
Lead Channel Impedance Value: 361 Ohm
Lead Channel Impedance Value: 532 Ohm
Lead Channel Pacing Threshold Amplitude: 0.75 V
Lead Channel Pacing Threshold Pulse Width: 0.4 ms
Lead Channel Sensing Intrinsic Amplitude: 4.875 mV
Lead Channel Sensing Intrinsic Amplitude: 5.625 mV
Lead Channel Setting Pacing Amplitude: 2.5 V
Lead Channel Setting Pacing Pulse Width: 0.4 ms
Lead Channel Setting Sensing Sensitivity: 1.2 mV
Zone Setting Status: 755011

## 2023-09-09 ENCOUNTER — Other Ambulatory Visit: Payer: Self-pay | Admitting: Internal Medicine

## 2023-09-14 ENCOUNTER — Telehealth: Payer: Self-pay | Admitting: Internal Medicine

## 2023-09-14 NOTE — Telephone Encounter (Unsigned)
 Copied from CRM [id number]. Topic: General - Other >> Sep 14, 2023 10:51 AM Gennette ORN wrote: Reason for CRM: Patient wife's called in to cancel patient appointment and stated also he was in pain that she has to figure out what's going on. No swelling or anything just pain. She stated she was going to call his other dr about. Patient's wife decline speaking with nurse.Please contact patient for further assistance.

## 2023-09-15 ENCOUNTER — Encounter: Admitting: Internal Medicine

## 2023-09-15 NOTE — Telephone Encounter (Signed)
 FYI called patient spouse for more information. Right sided ankle, foot pain. No swelling present. Will be following up with ortho instead

## 2023-09-18 ENCOUNTER — Ambulatory Visit (INDEPENDENT_AMBULATORY_CARE_PROVIDER_SITE_OTHER): Admitting: Gastroenterology

## 2023-09-18 ENCOUNTER — Encounter: Payer: Self-pay | Admitting: Gastroenterology

## 2023-09-18 VITALS — BP 122/70 | HR 71 | Ht 70.0 in | Wt 175.5 lb

## 2023-09-18 DIAGNOSIS — K5909 Other constipation: Secondary | ICD-10-CM | POA: Diagnosis not present

## 2023-09-18 DIAGNOSIS — K219 Gastro-esophageal reflux disease without esophagitis: Secondary | ICD-10-CM | POA: Diagnosis not present

## 2023-09-18 NOTE — Progress Notes (Signed)
 Chief Complaint: constipation Primary GI Doctor: Dr. Abran  HPI:  Patient is a  89  year old male patient known to Dr.  Abran with multiple medical problems not limited to A-fib/a flutter , permanent pacemaker CAD , carotid stenosis , hypertension , HLD, GERD, colon polyps. See PMH /PSH for additional history.  Patient last seen in the GI office on 11/04/2021 by Vina, NP for constipation.   Previous GI Evaluation    Last complete colonoscopy by Dr. Belvie Just November 2016 revealed diverticulosis, incidental lipoma, and diminutive adenomas  Interval History     He is self-referred today regarding complaints with intermittent constipation, accompanied by his wife. He reports he is currently taking OTC laxatives from the store. He reports he hasn't had any recent issues with constipation. He tries to avoid daily use. He has occasional diarrhea if he eats certain foods like cherries.     Patient has history of GERD and taking pantoprazole  40 mg and OTC antiacids prn. He denies dysphagia. He admits he eats late at night sometimes which he knows he should avoid. He also sometimes enjoys pizza which can give him heartburn.   Patient still walking on his treadmill at home. [place].   Wt Readings from Last 3 Encounters:  09/18/23 175 lb 8 oz (79.6 kg)  04/29/23 178 lb 9.6 oz (81 kg)  04/13/23 173 lb 8 oz (78.7 kg)    Past Medical History:  Diagnosis Date   ABUSE, ALCOHOL, IN REMISSION 04/08/2007   ALLERGIC RHINITIS 09/29/2006   Arthritis    hands (07/02/2016)   Atrial fibrillation (HCC)    BENIGN PROSTATIC HYPERTROPHY 09/29/2006   BPPV (benign paroxysmal positional vertigo) 04/08/2007   CAD (coronary artery disease)    a. LHC 2/17: EF 55-65%, LM 75, pLAD 75, oD1 75, oD2 65, D3 85, oLCx 99, oOM1 75, pRCA 25 >> CABG   Carotid stenosis    a. Carotid US  2/17:  Bilateral ICA 1-39% ICA   Chronic lower back pain    COLONIC POLYPS, HX OF 06/23/2007    DISORDERS, ORGANIC INSOMNIA NOS 09/29/2006   Diverticulosis    ERECTILE DYSFUNCTION 09/29/2006   GERD (gastroesophageal reflux disease)    GLUCOSE INTOLERANCE 03/19/2010   HYPERLIPIDEMIA 09/29/2006   HYPERTENSION 09/29/2006   Long term (current) use of anticoagulants 04/26/2010   MELANOMA, MALIGNANT, SKIN NOS 09/29/2006   other skin cancers -no further melanoma   OSTEOARTHROSIS NOS, OTHER [facility] SITE 09/29/2006   Other specified forms of hearing loss 08/10/2009   Pneumonia ~ 1969   Presence of permanent cardiac pacemaker 06/02/2016   VENTRICULAR TACHYCARDIA 09/29/2006    Past Surgical History:  Procedure Laterality Date   AV NODE ABLATION N/A 07/02/2016   Procedure: AV Node Ablation;  Surgeon: Fernande Elspeth BROCKS, MD;  Location: [facility] INVASIVE CV LAB;  Service: Cardiovascular;  Laterality: N/A;   CARDIAC CATHETERIZATION N/A 03/12/2015   Procedure: Left Heart Cath and Coronary Angiography;  Surgeon: Victory LELON Sharps, MD;  Location: [facility] INVASIVE CV LAB;  Service: Cardiovascular;  Laterality: N/A;   CARDIOVERSION N/A 12/19/2015   Procedure: CARDIOVERSION;  Surgeon: Oneil BROCKS Parchment, MD;  Location: [facility] ENDOSCOPY;  Service: Cardiovascular;  Laterality: N/A;   CARDIOVERSION N/A 03/24/2016   Procedure: CARDIOVERSION;  Surgeon: Leim VEAR Moose, MD;  Location: [facility] ENDOSCOPY;  Service: Cardiovascular;  Laterality: N/A;   CATARACT EXTRACTION W/ INTRAOCULAR LENS  IMPLANT, BILATERAL Bilateral    CLIPPING OF ATRIAL APPENDAGE N/A 03/14/2015  Procedure: CLIPPING OF ATRIAL APPENDAGE;  Surgeon: Elspeth JAYSON Millers, MD;  Location: [facility] OR;  Service: Open Heart Surgery;  Laterality: N/A;   COLONOSCOPY WITH PROPOFOL  N/A 12/08/2014   Procedure: COLONOSCOPY WITH PROPOFOL ;  Surgeon: Belvie Just, MD;  Location: [facility] ENDOSCOPY;  Service: Endoscopy;  Laterality: N/A;   CORONARY ARTERY BYPASS GRAFT N/A 03/14/2015   Procedure: CORONARY ARTERY BYPASS GRAFTING (CABG) x 4 (LIMA to LAD, SVG to DIAGONAL 2, SVG SEQUENTIALLY to OM1 and  OM2);  Surgeon: Elspeth JAYSON Millers, MD;  Location: [facility] OR;  Service: Open Heart Surgery;  Laterality: N/A;   INCISION AND DRAINAGE N/A 07/03/2016   Procedure: INCISION AND DRAINAGE of CHEST ABSCESS;  Surgeon: Millers Elspeth JAYSON, MD;  Location: [facility] OR;  Service: Thoracic;  Laterality: N/A;   JOINT REPLACEMENT     KNEE ARTHROSCOPY Left    MELANOMA EXCISION     upper back   PACEMAKER IMPLANT N/A 06/02/2016   Procedure: Pacemaker Implant;  Surgeon: Elspeth JAYSON Sage, MD;  Location: [facility] INVASIVE CV LAB;  Service: Cardiovascular;  Laterality: N/A;   SHOULDER OPEN ROTATOR CUFF REPAIR Left    SKIN CANCER EXCISION Right    cheek; real deep   TEE WITHOUT CARDIOVERSION N/A 03/14/2015   Procedure: TRANSESOPHAGEAL ECHOCARDIOGRAM (TEE);  Surgeon: Elspeth JAYSON Millers, MD;  Location: [facility] OR;  Service: Open Heart Surgery;  Laterality: N/A;   TEE WITHOUT CARDIOVERSION N/A 12/19/2015   Procedure: TRANSESOPHAGEAL ECHOCARDIOGRAM (TEE);  Surgeon: Oneil JAYSON Parchment, MD;  Location: [facility] ENDOSCOPY;  Service: Cardiovascular;  Laterality: N/A;   TEE WITHOUT CARDIOVERSION N/A 03/24/2016   Procedure: TRANSESOPHAGEAL ECHOCARDIOGRAM (TEE);  Surgeon: Leim VEAR Moose, MD;  Location: [facility] ENDOSCOPY;  Service: Cardiovascular;  Laterality: N/A;   TONSILLECTOMY     TOTAL KNEE ARTHROPLASTY Right     Current Outpatient Medications  Medication Sig Dispense Refill   atorvastatin  (LIPITOR) 10 MG tablet TAKE 1 TABLET DAILY 90 tablet 3   B Complex Vitamins (VITAMIN B COMPLEX 100 IJ) Vitamin B Complex     ELIQUIS  5 MG TABS tablet Take 1 tablet (5 mg total) by mouth 2 (two) times daily. 180 tablet 1   HYDROcodone -acetaminophen  (NORCO) 10-325 MG tablet Take 1 tablet by mouth daily as needed for severe pain (pain score 7-10). 30 tablet 0   losartan  (COZAAR ) 50 MG tablet Take 1 tablet (50 mg total) by mouth daily. 90 tablet 3   Multiple Vitamin (MULTIVITAMIN) tablet Take 1 tablet by mouth daily.     nitroGLYCERIN  (NITROSTAT ) 0.4 MG SL  tablet Take 1 tablet under your tongue, while sitting.  If no relief of pain Tyron Manetta repeat NTG, one tab every 5 minutes up to 3 tablets total over 15 minutes.  If no relief CALL 911. (Patient taking differently: as needed. Take 1 tablet under your tongue, while sitting.  If no relief of pain Stefanee Mckell repeat NTG, one tab every 5 minutes up to 3 tablets total over 15 minutes.  If no relief CALL 911.) 25 tablet 3   Omega-3 Fatty Acids  (FISH OIL PO) Take 1,000 mg by mouth daily.     pantoprazole  (PROTONIX ) 40 MG tablet Take 1 tablet by mouth once daily (Patient taking differently: Take 40 mg by mouth as needed.) 90 tablet 3   Psyllium (METAMUCIL PO) Take 15 mLs by mouth 3 (three) times daily as needed (constipation).      tamsulosin  (FLOMAX ) 0.4 MG CAPS capsule Take 1 capsule (0.4 mg total) by mouth in the morning and at bedtime. 180  capsule 3   triamcinolone  cream (KENALOG ) 0.1 % SMARTSIG:Topical 1-2 Times Daily PRN     No current facility-administered medications for this visit.    Allergies as of 09/18/2023 - Review Complete 09/18/2023  Allergen Reaction Noted   Amiodarone  hcl Other (See Comments) 03/14/2008   Ace inhibitors Cough 03/14/2008    Family History  Problem Relation Age of Onset   Diabetes Brother    Esophageal cancer Father    Colon cancer Neg Hx    Stomach cancer Neg Hx     Review of Systems:    Constitutional: No weight loss, fever, chills, weakness or fatigue HEENT: Eyes: No change in vision               Ears, Nose, Throat:  No change in hearing or congestion Skin: No rash or itching Cardiovascular: No chest pain, chest pressure or palpitations   Respiratory: No SOB or cough Gastrointestinal: See HPI and otherwise negative Genitourinary: No dysuria or change in urinary frequency Neurological: No headache, dizziness or syncope Musculoskeletal: No new muscle or joint pain Hematologic: No bleeding or bruising Psychiatric: No history of depression or anxiety    Physical  Exam:  Vital signs: BP 122/70 (BP Location: Left Arm, Patient Position: Sitting)   Pulse 71   Ht 5' 10 (1.778 m) Comment: height without shoes  Wt 175 lb 8 oz (79.6 kg)   BMI 25.18 kg/m   Constitutional:   Pleasant male appears to be in NAD, Well developed, Well nourished, alert and cooperative Throat: Oral cavity and pharynx without inflammation, swelling or lesion.  Respiratory: Respirations even and unlabored. Lungs clear to auscultation bilaterally.   No wheezes, crackles, or rhonchi.  Cardiovascular: Normal S1, S2. Regular rate and rhythm. No peripheral edema, cyanosis or pallor.  Gastrointestinal:  Soft, nondistended, nontender. No rebound or guarding. Normal bowel sounds. No appreciable masses or hepatomegaly. Rectal:  Not performed.  Msk:  Symmetrical without gross deformities. Without edema, no deformity or joint abnormality.  Neurologic:  Alert and  oriented x4;  grossly normal neurologically.  Skin:   Dry and intact without significant lesions or rashes.  RELEVANT LABS AND IMAGING: CBC    Latest Ref Rng & Units 12/17/2022    2:12 PM 08/14/2022    3:21 PM 03/04/2022    2:07 PM  CBC  WBC 4.0 - 10.5 K/uL 6.9  7.8  7.4   Hemoglobin 13.0 - 17.0 g/dL 85.6  86.4  86.1   Hematocrit 39.0 - 52.0 % 42.6  40.6  40.7   Platelets 150.0 - 400.0 K/uL 235.0  223.0  230.0      CMP     Latest Ref Rng & Units 12/17/2022    2:12 PM 08/14/2022    3:21 PM 03/04/2022    2:07 PM  CMP  Glucose 70 - 99 mg/dL 888  880  94   BUN 6 - 23 mg/dL 14  18  16    Creatinine 0.40 - 1.50 mg/dL 9.09  9.06  9.06   Sodium 135 - 145 mEq/L 133  131  134   Potassium 3.5 - 5.1 mEq/L 4.8  4.7  5.0   Chloride 96 - 112 mEq/L 97  99  99   CO2 19 - 32 mEq/L 28  26  28    Calcium  8.4 - 10.5 mg/dL 9.8  9.5  9.4   Total Protein 6.0 - 8.3 g/dL 7.4  6.7  6.6   Total Bilirubin 0.2 - 1.2 mg/dL 1.1  1.1  0.6   Alkaline Phos 39 - 117 U/L 95  93  99   AST 0 - 37 U/L 23  20  21    ALT 0 - 53 U/L 18  13  15       Lab  Results  Component Value Date   TSH 0.57 12/17/2022     Assessment: Encounter Diagnoses  Name Primary?   Chronic constipation Yes   Gastroesophageal reflux disease, unspecified whether esophagitis present     89 year old male patient with chronic constipation who presents for follow-up.  Patient states he was having issues a month or 2 ago but since then has resolved.  Patient uses over-the-counter laxatives as needed.     Patient also has GERD controlled with antiacids as needed and following strict GERD diet and not eating too late at night.  Patient denies dysphagia.  Overall patient states he is doing very well.  Plan: -Continue high fiber diet -Continue OTC laxative as needed -Continue GERD diet, no late meals 3-4 hours before lying down -Continue Pantoprazole  40 mg po daily   Thank you for the courtesy of this consult. Please call me with any questions or concerns.   Janie Strothman, FNP-C [facility] 09/18/2023, 4:08 PM  Cc: Norleen Lynwood ORN, MD

## 2023-09-19 NOTE — Progress Notes (Signed)
 Noted

## 2023-09-21 ENCOUNTER — Ambulatory Visit: Payer: Self-pay

## 2023-09-21 NOTE — Telephone Encounter (Signed)
 FYI Only or Action Required?: FYI only for provider.  Patient was last seen in primary care on 12/17/2022 by Norleen Lynwood ORN, MD.  Called Nurse Triage reporting Fall.  Symptoms began today.  Interventions attempted: Nothing.  Symptoms are: gradually worsening.  Triage Disposition: Call PCP Within 24 Hours  Patient/caregiver understands and will follow disposition?: Yes      Copied from CRM #[id number]. Topic: Clinical - Red Word Triage >> Sep 21, 2023  4:15 PM Roselie BROCKS wrote: Red Word that prompted transfer to Nurse Triage: patient fell hit his head really hard, he keeps falling and very weak. He demanded to get out of bed and get into a chair and fell out of chair and hit his head very hard, wife called 911,but he refused to go to ER. She said he is getting worse. Reason for Disposition  [1] Caller has NON-URGENT question AND [2] triager unable to answer question  Answer Assessment - Initial Assessment Questions 1. MECHANISM: How did the fall happen?     Shuffles when he walks tripped and fell 2. DOMESTIC VIOLENCE AND ELDER ABUSE SCREENING: Did you fall because someone pushed you or tried to hurt you? If Yes, ask: Are you safe now?     no 3. ONSET: When did the fall happen? (e.g., minutes, hours, or days ago)     yesterday 4. LOCATION: What part of the body hit the ground? (e.g., back, buttocks, head, hips, knees, hands, head, stomach)      5. INJURY: Did you hurt (injure) yourself when you fell? If Yes, ask: What did you injure? Tell me more about this? (e.g., body area; type of injury; pain severity)     Hit head, weakness all over, 6. PAIN: Is there any pain? If Yes, ask: How bad is the pain? (e.g., Scale 0-10; or none, mild,      unknown 7. SIZE: For cuts, bruises, or swelling, ask: How large is it? (e.g., inches or centimeters)      na 8. PREGNANCY: Is there any chance you are pregnant? When was your last menstrual period?     na 9. OTHER  SYMPTOMS: Do you have any other symptoms? (e.g., dizziness, fever, weakness; new-onset or worsening).      na 10. CAUSE: What do you think caused the fall (or falling)? (e.g., dizzy spell, tripped)       tripped  Pt fell last week hit his head/  fell again last night, fell again today  Protocols used: Falls and [facility]

## 2023-09-22 ENCOUNTER — Telehealth (INDEPENDENT_AMBULATORY_CARE_PROVIDER_SITE_OTHER): Admitting: Internal Medicine

## 2023-09-22 ENCOUNTER — Telehealth: Payer: Self-pay

## 2023-09-22 ENCOUNTER — Encounter: Payer: Self-pay | Admitting: Internal Medicine

## 2023-09-22 ENCOUNTER — Other Ambulatory Visit: Payer: Self-pay

## 2023-09-22 ENCOUNTER — Encounter (HOSPITAL_COMMUNITY): Payer: Self-pay | Admitting: Internal Medicine

## 2023-09-22 ENCOUNTER — Inpatient Hospital Stay (HOSPITAL_COMMUNITY)
Admission: EM | Admit: 2023-09-22 | Discharge: 2023-09-28 | DRG: 641 | Disposition: A | Discharge status: Skilled Nursing Facility | Attending: Internal Medicine | Admitting: Internal Medicine

## 2023-09-22 ENCOUNTER — Emergency Department (HOSPITAL_COMMUNITY)

## 2023-09-22 VITALS — BP 120/86 | Ht 70.0 in | Wt 170.0 lb

## 2023-09-22 DIAGNOSIS — Z85828 Personal history of other malignant neoplasm of skin: Secondary | ICD-10-CM | POA: Diagnosis not present

## 2023-09-22 DIAGNOSIS — Z951 Presence of aortocoronary bypass graft: Secondary | ICD-10-CM

## 2023-09-22 DIAGNOSIS — W19XXXA Unspecified fall, initial encounter: Principal | ICD-10-CM

## 2023-09-22 DIAGNOSIS — F1011 Alcohol abuse, in remission: Secondary | ICD-10-CM | POA: Diagnosis present

## 2023-09-22 DIAGNOSIS — N400 Enlarged prostate without lower urinary tract symptoms: Secondary | ICD-10-CM | POA: Diagnosis present

## 2023-09-22 DIAGNOSIS — Z66 Do not resuscitate: Secondary | ICD-10-CM | POA: Diagnosis present

## 2023-09-22 DIAGNOSIS — E876 Hypokalemia: Secondary | ICD-10-CM | POA: Diagnosis not present

## 2023-09-22 DIAGNOSIS — H9190 Unspecified hearing loss, unspecified ear: Secondary | ICD-10-CM | POA: Diagnosis present

## 2023-09-22 DIAGNOSIS — E785 Hyperlipidemia, unspecified: Secondary | ICD-10-CM | POA: Diagnosis present

## 2023-09-22 DIAGNOSIS — Z742 Need for assistance at home and no other household member able to render care: Secondary | ICD-10-CM | POA: Diagnosis present

## 2023-09-22 DIAGNOSIS — Z7901 Long term (current) use of anticoagulants: Secondary | ICD-10-CM

## 2023-09-22 DIAGNOSIS — E871 Hypo-osmolality and hyponatremia: Secondary | ICD-10-CM | POA: Diagnosis present

## 2023-09-22 DIAGNOSIS — R739 Hyperglycemia, unspecified: Secondary | ICD-10-CM

## 2023-09-22 DIAGNOSIS — F0150 Vascular dementia without behavioral disturbance: Secondary | ICD-10-CM | POA: Diagnosis present

## 2023-09-22 DIAGNOSIS — Z6824 Body mass index (BMI) 24.0-24.9, adult: Secondary | ICD-10-CM

## 2023-09-22 DIAGNOSIS — R296 Repeated falls: Secondary | ICD-10-CM

## 2023-09-22 DIAGNOSIS — I4430 Unspecified atrioventricular block: Secondary | ICD-10-CM | POA: Diagnosis present

## 2023-09-22 DIAGNOSIS — E860 Dehydration: Secondary | ICD-10-CM | POA: Diagnosis present

## 2023-09-22 DIAGNOSIS — I2510 Atherosclerotic heart disease of native coronary artery without angina pectoris: Secondary | ICD-10-CM | POA: Diagnosis present

## 2023-09-22 DIAGNOSIS — R531 Weakness: Secondary | ICD-10-CM | POA: Diagnosis not present

## 2023-09-22 DIAGNOSIS — Z888 Allergy status to other drugs, medicaments and biological substances status: Secondary | ICD-10-CM

## 2023-09-22 DIAGNOSIS — Z87891 Personal history of nicotine dependence: Secondary | ICD-10-CM

## 2023-09-22 DIAGNOSIS — K219 Gastro-esophageal reflux disease without esophagitis: Secondary | ICD-10-CM | POA: Diagnosis present

## 2023-09-22 DIAGNOSIS — F05 Delirium due to known physiological condition: Secondary | ICD-10-CM | POA: Diagnosis not present

## 2023-09-22 DIAGNOSIS — Z96651 Presence of right artificial knee joint: Secondary | ICD-10-CM | POA: Diagnosis present

## 2023-09-22 DIAGNOSIS — Z85820 Personal history of malignant melanoma of skin: Secondary | ICD-10-CM

## 2023-09-22 DIAGNOSIS — R627 Adult failure to thrive: Secondary | ICD-10-CM | POA: Diagnosis present

## 2023-09-22 DIAGNOSIS — Z950 Presence of cardiac pacemaker: Secondary | ICD-10-CM

## 2023-09-22 DIAGNOSIS — I4892 Unspecified atrial flutter: Secondary | ICD-10-CM | POA: Diagnosis present

## 2023-09-22 DIAGNOSIS — I119 Hypertensive heart disease without heart failure: Secondary | ICD-10-CM | POA: Diagnosis present

## 2023-09-22 DIAGNOSIS — Z833 Family history of diabetes mellitus: Secondary | ICD-10-CM | POA: Diagnosis not present

## 2023-09-22 DIAGNOSIS — Z800 Family history of malignant neoplasm of digestive organs: Secondary | ICD-10-CM

## 2023-09-22 DIAGNOSIS — Z79899 Other long term (current) drug therapy: Secondary | ICD-10-CM

## 2023-09-22 LAB — TROPONIN I (HIGH SENSITIVITY)
Troponin I (High Sensitivity): 33 ng/L — ABNORMAL HIGH (ref <18)
Troponin I (High Sensitivity): 28 ng/L — ABNORMAL HIGH (ref <18)

## 2023-09-22 LAB — CBC WITH DIFFERENTIAL/PLATELET
Abs Immature Granulocytes: 0.01 K/uL (ref 0.00–0.07)
Basophils Absolute: 0 K/uL (ref 0.0–0.1)
Basophils Relative: 0 %
Eosinophils Absolute: 0 K/uL (ref 0.0–0.5)
Eosinophils Relative: 0 %
HCT: 40.6 % (ref 39.0–52.0)
Hemoglobin: 13.8 g/dL (ref 13.0–17.0)
Immature Granulocytes: 0 %
Lymphocytes Relative: 27 %
Lymphs Abs: 1.2 K/uL (ref 0.7–4.0)
MCH: 33.1 pg (ref 26.0–34.0)
MCHC: 34 g/dL (ref 30.0–36.0)
MCV: 97.4 fL (ref 80.0–100.0)
Monocytes Absolute: 1 K/uL (ref 0.1–1.0)
Monocytes Relative: 23 %
Neutro Abs: 2.2 K/uL (ref 1.7–7.7)
Neutrophils Relative %: 50 %
Platelets: 165 K/uL (ref 150–400)
RBC: 4.17 MIL/uL — ABNORMAL LOW (ref 4.22–5.81)
RDW: 12.4 % (ref 11.5–15.5)
WBC: 4.5 K/uL (ref 4.0–10.5)
nRBC: 0 % (ref 0.0–0.2)

## 2023-09-22 LAB — URINALYSIS, ROUTINE W REFLEX MICROSCOPIC
Bilirubin Urine: NEGATIVE
Glucose, UA: NEGATIVE mg/dL
Hgb urine dipstick: NEGATIVE
Ketones, ur: 5 mg/dL — AB
Leukocytes,Ua: NEGATIVE
Nitrite: NEGATIVE
Protein, ur: NEGATIVE mg/dL
Specific Gravity, Urine: 1.013 (ref 1.005–1.030)
pH: 6 (ref 5.0–8.0)

## 2023-09-22 LAB — COMPREHENSIVE METABOLIC PANEL WITH GFR
ALT: 23 U/L (ref 0–44)
AST: 45 U/L — ABNORMAL HIGH (ref 15–41)
Albumin: 3.2 g/dL — ABNORMAL LOW (ref 3.5–5.0)
Alkaline Phosphatase: 77 U/L (ref 38–126)
Anion gap: 10 (ref 5–15)
BUN: 14 mg/dL (ref 8–23)
CO2: 23 mmol/L (ref 22–32)
Calcium: 8.7 mg/dL — ABNORMAL LOW (ref 8.9–10.3)
Chloride: 93 mmol/L — ABNORMAL LOW (ref 98–111)
Creatinine, Ser: 0.93 mg/dL (ref 0.61–1.24)
GFR, Estimated: >60 mL/min (ref >60)
Glucose, Bld: 90 mg/dL (ref 70–99)
Potassium: 4.3 mmol/L (ref 3.5–5.1)
Sodium: 126 mmol/L — ABNORMAL LOW (ref 135–145)
Total Bilirubin: 0.9 mg/dL (ref 0.0–1.2)
Total Protein: 6.3 g/dL — ABNORMAL LOW (ref 6.5–8.1)

## 2023-09-22 LAB — PROTIME-INR
INR: 1.3 — ABNORMAL HIGH (ref 0.8–1.2)
Prothrombin Time: 17.2 s — ABNORMAL HIGH (ref 11.4–15.2)

## 2023-09-22 LAB — TSH: TSH: 0.91 u[IU]/mL (ref 0.350–4.500)

## 2023-09-22 LAB — OSMOLALITY: Osmolality: 270 mosm/kg — ABNORMAL LOW (ref 275–295)

## 2023-09-22 LAB — CREATININE, URINE, RANDOM: Creatinine, Urine: 89 mg/dL

## 2023-09-22 LAB — BRAIN NATRIURETIC PEPTIDE: B Natriuretic Peptide: 135.8 pg/mL — ABNORMAL HIGH (ref 0.0–100.0)

## 2023-09-22 LAB — OSMOLALITY, URINE: Osmolality, Ur: 514 mosm/kg (ref 300–900)

## 2023-09-22 MED ORDER — LACTATED RINGERS IV SOLN: INTRAVENOUS | Status: DC

## 2023-09-22 MED ORDER — MAGNESIUM CITRATE PO SOLN: 1.0000 | Freq: Once | ORAL | Status: DC | PRN

## 2023-09-22 MED ORDER — APIXABAN 5 MG PO TABS
5.0000 mg | ORAL_TABLET | Freq: Two times a day (BID) | ORAL | Status: DC
  Administered 2023-09-22 – 2023-09-28 (×12): 5 mg via ORAL
  Filled 2023-09-22 (×12): qty 1

## 2023-09-22 MED ORDER — OMEGA-3-ACID ETHYL ESTERS 1 G PO CAPS
1.0000 g | ORAL_CAPSULE | Freq: Every day | ORAL | Status: DC
  Administered 2023-09-23 – 2023-09-28 (×6): 1 g via ORAL
  Filled 2023-09-22 (×6): qty 1

## 2023-09-22 MED ORDER — TAMSULOSIN HCL 0.4 MG PO CAPS
0.4000 mg | ORAL_CAPSULE | Freq: Every day | ORAL | Status: DC
  Filled 2023-09-22: qty 1

## 2023-09-22 MED ORDER — ATORVASTATIN CALCIUM 10 MG PO TABS
10.0000 mg | ORAL_TABLET | Freq: Every day | ORAL | Status: DC
  Administered 2023-09-23 – 2023-09-28 (×6): 10 mg via ORAL
  Filled 2023-09-22 (×6): qty 1

## 2023-09-22 MED ORDER — ACETAMINOPHEN 650 MG RE SUPP: 650.0000 mg | Freq: Four times a day (QID) | RECTAL | Status: DC | PRN

## 2023-09-22 MED ORDER — ADULT MULTIVITAMIN W/MINERALS CH
1.0000 | ORAL_TABLET | Freq: Every day | ORAL | Status: DC
  Administered 2023-09-23 – 2023-09-28 (×6): 1 via ORAL
  Filled 2023-09-22 (×7): qty 1

## 2023-09-22 MED ORDER — HYDRALAZINE HCL 50 MG PO TABS
50.0000 mg | ORAL_TABLET | Freq: Three times a day (TID) | ORAL | Status: DC
  Administered 2023-09-22 – 2023-09-27 (×15): 50 mg via ORAL
  Filled 2023-09-22 (×17): qty 1

## 2023-09-22 MED ORDER — PROMETHAZINE HCL 12.5 MG PO TABS
12.5000 mg | ORAL_TABLET | Freq: Four times a day (QID) | ORAL | Status: DC | PRN
Start: 2023-09-22 — End: 2023-09-27

## 2023-09-22 MED ORDER — TAMSULOSIN HCL 0.4 MG PO CAPS
0.4000 mg | ORAL_CAPSULE | Freq: Every day | ORAL | Status: DC
  Administered 2023-09-22 – 2023-09-27 (×6): 0.4 mg via ORAL
  Filled 2023-09-22 (×7): qty 1

## 2023-09-22 MED ORDER — ACETAMINOPHEN 325 MG PO TABS
650.0000 mg | ORAL_TABLET | Freq: Four times a day (QID) | ORAL | Status: DC | PRN
  Administered 2023-09-23: 650 mg via ORAL
  Filled 2023-09-22: qty 2

## 2023-09-22 MED ORDER — NITROGLYCERIN 0.4 MG SL SUBL: 0.4000 mg | SUBLINGUAL_TABLET | SUBLINGUAL | Status: DC | PRN

## 2023-09-22 NOTE — Telephone Encounter (Signed)
 Copied from CRM [id number]. Topic: Clinical - Medical Advice >> Sep 22, 2023 11:04 AM Chiquita SQUIBB wrote: Reason for CRM: Patients wife is calling in requesting an antibiotic for a cough and possibly UTI. Patient has a virtual visit today at 1:20,  but is requesting if they could possibly have something called in before then. Please advise patients wife.

## 2023-09-22 NOTE — ED Triage Notes (Addendum)
 Patient from home where he has had three falls without LOC over the last month. Patient denies pain. Most recent fall on Sunday. Did hit head and is on Eliquis . Refused EMS transport over the weekend at time of prior fall. A/O x 4, GCS 15.

## 2023-09-22 NOTE — Assessment & Plan Note (Signed)
 Etiology unclear, but differential includes low volume and infection; pt appears quite ill, I was able to tell him he really needs to go to the ED now, and we will call the ambulance for him.

## 2023-09-22 NOTE — Assessment & Plan Note (Signed)
 Denies CP , cont same tx

## 2023-09-22 NOTE — Assessment & Plan Note (Signed)
 I suspect as part of his eval and tx he will need inpatient status, with PT

## 2023-09-22 NOTE — Progress Notes (Signed)
 Patient ID: Jonathan Orozco, male   DOB: 05/24/1928, 89 y.o.   MRN: [id number]  Virtual Visit via Video Note  I connected with Jonathan Orozco on 09/22/23 at  1:20 PM EDT by a video enabled telemedicine application and verified that I am speaking with the correct person using two identifiers.  Location of all participants today Patient: at home with wife giving most of hx Provider: at office   I discussed the limitations of evaluation and management by telemedicine and the availability of in person appointments. The patient expressed understanding and agreed to proceed.  History of Present Illness: Here with wife crying and tearful over his pitiful situation;  has gotten steadily weaker and fallen x 3 at least in the past wk.  Wife had called EMS yesterday but he refused transport to ED, and is not overly confused  He can't stand well and she cannot even move him in the bed.   Pt denies chest pain,  wheezing, orthopnea, PND, increased LE swelling, palpitations, dizziness or syncope, but has had worsening cough and sob in the past wk, as well some concern about UTI with frequency and off color urine.   Past Medical History:  Diagnosis Date   ABUSE, ALCOHOL, IN REMISSION 04/08/2007   ALLERGIC RHINITIS 09/29/2006   Arthritis    hands (07/02/2016)   Atrial fibrillation (HCC)    BENIGN PROSTATIC HYPERTROPHY 09/29/2006   BPPV (benign paroxysmal positional vertigo) 04/08/2007   CAD (coronary artery disease)    a. LHC 2/17: EF 55-65%, LM 75, pLAD 75, oD1 75, oD2 65, D3 85, oLCx 99, oOM1 75, pRCA 25 >> CABG   Carotid stenosis    a. Carotid US  2/17:  Bilateral ICA 1-39% ICA   Chronic lower back pain    COLONIC POLYPS, HX OF 06/23/2007   DISORDERS, ORGANIC INSOMNIA NOS 09/29/2006   Diverticulosis    ERECTILE DYSFUNCTION 09/29/2006   GERD (gastroesophageal reflux disease)    GLUCOSE INTOLERANCE 03/19/2010   HYPERLIPIDEMIA 09/29/2006   HYPERTENSION 09/29/2006   Long term (current) use of  anticoagulants 04/26/2010   MELANOMA, MALIGNANT, SKIN NOS 09/29/2006   other skin cancers -no further melanoma   OSTEOARTHROSIS NOS, OTHER [facility] SITE 09/29/2006   Other specified forms of hearing loss 08/10/2009   Pneumonia ~ 1969   Presence of permanent cardiac pacemaker 06/02/2016   VENTRICULAR TACHYCARDIA 09/29/2006   Past Surgical History:  Procedure Laterality Date   AV NODE ABLATION N/A 07/02/2016   Procedure: AV Node Ablation;  Surgeon: Fernande Elspeth JAYSON, MD;  Location: [facility] INVASIVE CV LAB;  Service: Cardiovascular;  Laterality: N/A;   CARDIAC CATHETERIZATION N/A 03/12/2015   Procedure: Left Heart Cath and Coronary Angiography;  Surgeon: Victory LELON Sharps, MD;  Location: [facility] INVASIVE CV LAB;  Service: Cardiovascular;  Laterality: N/A;   CARDIOVERSION N/A 12/19/2015   Procedure: CARDIOVERSION;  Surgeon: Oneil JAYSON Parchment, MD;  Location: [facility] ENDOSCOPY;  Service: Cardiovascular;  Laterality: N/A;   CARDIOVERSION N/A 03/24/2016   Procedure: CARDIOVERSION;  Surgeon: Leim VEAR Moose, MD;  Location: [facility] ENDOSCOPY;  Service: Cardiovascular;  Laterality: N/A;   CATARACT EXTRACTION W/ INTRAOCULAR LENS  IMPLANT, BILATERAL Bilateral    CLIPPING OF ATRIAL APPENDAGE N/A 03/14/2015   Procedure: CLIPPING OF ATRIAL APPENDAGE;  Surgeon: Elspeth JAYSON Millers, MD;  Location: [facility] OR;  Service: Open Heart Surgery;  Laterality: N/A;   COLONOSCOPY WITH PROPOFOL  N/A 12/08/2014   Procedure: COLONOSCOPY WITH PROPOFOL ;  Surgeon: Belvie Just, MD;  Location: [facility] ENDOSCOPY;  Service: Endoscopy;  Laterality: N/A;   CORONARY ARTERY BYPASS GRAFT N/A 03/14/2015   Procedure: CORONARY ARTERY BYPASS GRAFTING (CABG) x 4 (LIMA to LAD, SVG to DIAGONAL 2, SVG SEQUENTIALLY to OM1 and OM2);  Surgeon: Elspeth JAYSON Millers, MD;  Location: [facility] OR;  Service: Open Heart Surgery;  Laterality: N/A;   INCISION AND DRAINAGE N/A 07/03/2016   Procedure: INCISION AND DRAINAGE of CHEST ABSCESS;  Surgeon: Millers Elspeth JAYSON, MD;  Location: [facility] OR;   Service: Thoracic;  Laterality: N/A;   JOINT REPLACEMENT     KNEE ARTHROSCOPY Left    MELANOMA EXCISION     upper back   PACEMAKER IMPLANT N/A 06/02/2016   Procedure: Pacemaker Implant;  Surgeon: Elspeth JAYSON Sage, MD;  Location: [facility] INVASIVE CV LAB;  Service: Cardiovascular;  Laterality: N/A;   SHOULDER OPEN ROTATOR CUFF REPAIR Left    SKIN CANCER EXCISION Right    cheek; real deep   TEE WITHOUT CARDIOVERSION N/A 03/14/2015   Procedure: TRANSESOPHAGEAL ECHOCARDIOGRAM (TEE);  Surgeon: Elspeth JAYSON Millers, MD;  Location: [facility] OR;  Service: Open Heart Surgery;  Laterality: N/A;   TEE WITHOUT CARDIOVERSION N/A 12/19/2015   Procedure: TRANSESOPHAGEAL ECHOCARDIOGRAM (TEE);  Surgeon: Oneil JAYSON Parchment, MD;  Location: [facility] ENDOSCOPY;  Service: Cardiovascular;  Laterality: N/A;   TEE WITHOUT CARDIOVERSION N/A 03/24/2016   Procedure: TRANSESOPHAGEAL ECHOCARDIOGRAM (TEE);  Surgeon: Leim VEAR Moose, MD;  Location: [facility] ENDOSCOPY;  Service: Cardiovascular;  Laterality: N/A;   TONSILLECTOMY     TOTAL KNEE ARTHROPLASTY Right     reports that he quit smoking about 25 years ago. His smoking use included cigarettes. He started smoking about 75 years ago. He has a 50 pack-year smoking history. He has been exposed to tobacco smoke. He has never used smokeless tobacco. He reports that he does not drink alcohol and does not use drugs. family history includes Diabetes in his brother; Esophageal cancer in his father. Allergies  Allergen Reactions   Amiodarone  Hcl Other (See Comments)    Patient reports photosensitivity   Ace Inhibitors Cough   Current Outpatient Medications on File Prior to Visit  Medication Sig Dispense Refill   atorvastatin  (LIPITOR) 10 MG tablet TAKE 1 TABLET DAILY 90 tablet 3   B Complex Vitamins (VITAMIN B COMPLEX 100 IJ) Vitamin B Complex     ELIQUIS  5 MG TABS tablet Take 1 tablet (5 mg total) by mouth 2 (two) times daily. 180 tablet 1   HYDROcodone -acetaminophen  (NORCO) 10-325 MG tablet  Take 1 tablet by mouth daily as needed for severe pain (pain score 7-10). 30 tablet 0   losartan  (COZAAR ) 50 MG tablet Take 1 tablet (50 mg total) by mouth daily. 90 tablet 3   Multiple Vitamin (MULTIVITAMIN) tablet Take 1 tablet by mouth daily.     nitroGLYCERIN  (NITROSTAT ) 0.4 MG SL tablet Take 1 tablet under your tongue, while sitting.  If no relief of pain may repeat NTG, one tab every 5 minutes up to 3 tablets total over 15 minutes.  If no relief CALL 911. (Patient taking differently: as needed. Take 1 tablet under your tongue, while sitting.  If no relief of pain may repeat NTG, one tab every 5 minutes up to 3 tablets total over 15 minutes.  If no relief CALL 911.) 25 tablet 3   Omega-3 Fatty Acids  (FISH OIL PO) Take 1,000 mg by mouth daily.     pantoprazole  (PROTONIX ) 40 MG tablet Take 1 tablet by mouth once daily (Patient taking differently: Take 40 mg by mouth as needed.)  90 tablet 3   Psyllium (METAMUCIL PO) Take 15 mLs by mouth 3 (three) times daily as needed (constipation).      tamsulosin  (FLOMAX ) 0.4 MG CAPS capsule Take 1 capsule (0.4 mg total) by mouth in the morning and at bedtime. 180 capsule 3   triamcinolone  cream (KENALOG ) 0.1 % SMARTSIG:Topical 1-2 Times Daily PRN     No current facility-administered medications on file prior to visit.    Observations/Objective: Alert, NAD but appears moderate to severe ill, appropriate mood and affect, resps normal, cn 2-12 intact, moves all 4s, no visible rash or swelling Lab Results  Component Value Date   WBC 6.9 12/17/2022   HGB 14.3 12/17/2022   HCT 42.6 12/17/2022   PLT 235.0 12/17/2022   GLUCOSE 111 (H) 12/17/2022   CHOL 127 12/17/2022   TRIG 53.0 12/17/2022   HDL 57.60 12/17/2022   LDLDIRECT 159.2 09/29/2006   LDLCALC 59 12/17/2022   ALT 18 12/17/2022   AST 23 12/17/2022   NA 133 (L) 12/17/2022   K 4.8 12/17/2022   CL 97 12/17/2022   CREATININE 0.90 12/17/2022   BUN 14 12/17/2022   CO2 28 12/17/2022   TSH 0.57  12/17/2022   PSA 15.86 (H) 03/04/2022   INR 3.1 (A) 06/01/2018   HGBA1C 5.8 12/17/2022   Assessment and Plan: See notes  Follow Up Instructions: See notes   I discussed the assessment and treatment plan with the patient. The patient was provided an opportunity to ask questions and all were answered. The patient agreed with the plan and demonstrated an understanding of the instructions.   The patient was advised to call back or seek an in-person evaluation if the symptoms worsen or if the condition fails to improve as anticipated.   Lynwood Rush, MD

## 2023-09-22 NOTE — Patient Instructions (Signed)
 Please continue all other medications as before, and refills have been done if requested.  Please have the pharmacy call with any other refills you may need.  Please keep your appointments with your specialists as you may have planned  We will call 911 for you now to go to the ED

## 2023-09-22 NOTE — H&P (Signed)
 [facility] H&P   Patient Demographics:    Jonathan Orozco, is a 89 y.o. male  MRN: [id number]   DOB - 1928/03/27  Admit Date - 09/22/2023  Outpatient Primary MD for the patient is Norleen Lynwood ORN, MD  Patient coming from: Home  Chief Complaint  Patient presents with   Fall      HPI:    Jonathan Orozco  is a 89 y.o. male, with history of atrial fibrillation on Eliquis , history of ablation and pacemaker placement, CAD s/p CABG, hypertension, dyslipidemia, diverticulosis, colonic polyps, BPPV, BPH who was in good health and lives with his elderly wife, however about 10 days ago while getting out of the bathtub he had a fall hit his head on the sink, at that time EMS came to the house but he refused to come to the hospital for evaluation, subsequently over the last 10 days he has had 2 other falls, has very difficult time getting around the house with a walker as his balance is very poor.    He denies any headache, no fever chills, no exposure to sick contacts, no cough or shortness of breath, no chest pain or palpitations, no diarrhea or dysuria, no nausea vomiting, no focal weakness, however he generally feels very weak all over.  In the ER he was found to be dehydrated, had very poor balance, multiple falls in the setting of Eliquis , living with her elderly wife and unable to care for himself.  I was called to admit him    Review of systems:    A full 10 point Review of Systems was done, except as stated above, all other Review of Systems were negative.   With Past History of the following :    Past Medical History:  Diagnosis Date   ABUSE, ALCOHOL, IN REMISSION 04/08/2007   ALLERGIC RHINITIS 09/29/2006   Arthritis     hands (07/02/2016)   Atrial fibrillation (HCC)    BENIGN PROSTATIC HYPERTROPHY 09/29/2006   BPPV (benign paroxysmal positional vertigo) 04/08/2007   CAD (coronary artery disease)    a. LHC 2/17: EF 55-65%, LM 75, pLAD 75, oD1 75, oD2 65, D3 85, oLCx 99, oOM1 75, pRCA 25 >> CABG   Carotid stenosis    a. 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   Diverticulosis    ERECTILE DYSFUNCTION 09/29/2006   GERD (gastroesophageal reflux disease)    GLUCOSE INTOLERANCE 03/19/2010   HYPERLIPIDEMIA 09/29/2006   HYPERTENSION 09/29/2006   Long term (current) use of anticoagulants 04/26/2010   MELANOMA, MALIGNANT, SKIN NOS 09/29/2006   other skin cancers -no further melanoma   OSTEOARTHROSIS NOS, OTHER [facility] SITE 09/29/2006   Other specified forms of hearing loss 08/10/2009   Pneumonia ~ 1969   Presence of permanent cardiac pacemaker 06/02/2016   VENTRICULAR TACHYCARDIA 09/29/2006      Past Surgical History:  Procedure Laterality Date   AV NODE ABLATION N/A 07/02/2016   Procedure: AV Node Ablation;  Surgeon: Fernande Elspeth BROCKS, MD;  Location: [facility] INVASIVE CV LAB;  Service: Cardiovascular;  Laterality: N/A;   CARDIAC CATHETERIZATION N/A 03/12/2015   Procedure: Left Heart Cath and Coronary Angiography;  Surgeon: Victory LELON Sharps, MD;  Location: [facility] INVASIVE CV LAB;  Service: Cardiovascular;  Laterality: N/A;   CARDIOVERSION N/A 12/19/2015   Procedure: CARDIOVERSION;  Surgeon: Oneil BROCKS Parchment, MD;  Location: [facility] ENDOSCOPY;  Service: Cardiovascular;  Laterality: N/A;   CARDIOVERSION N/A 03/24/2016   Procedure: CARDIOVERSION;  Surgeon: Leim VEAR Moose, MD;  Location: [facility] ENDOSCOPY;  Service: Cardiovascular;  Laterality: N/A;   CATARACT EXTRACTION W/ INTRAOCULAR LENS  IMPLANT, BILATERAL Bilateral    CLIPPING OF ATRIAL APPENDAGE N/A 03/14/2015   Procedure: CLIPPING OF ATRIAL APPENDAGE;  Surgeon: Elspeth BROCKS Millers, MD;  Location: [facility] OR;  Service: Open Heart Surgery;  Laterality: N/A;   COLONOSCOPY WITH PROPOFOL  N/A 12/08/2014   Procedure: COLONOSCOPY WITH PROPOFOL ;  Surgeon: Belvie Just, MD;  Location: [facility] ENDOSCOPY;  Service: Endoscopy;  Laterality: N/A;   CORONARY ARTERY BYPASS GRAFT N/A 03/14/2015   Procedure: CORONARY ARTERY BYPASS GRAFTING (CABG) x 4 (LIMA to LAD, SVG to DIAGONAL 2, SVG SEQUENTIALLY to OM1 and OM2);  Surgeon: Elspeth BROCKS Millers, MD;  Location: [facility] OR;  Service: Open Heart Surgery;  Laterality: N/A;   INCISION AND DRAINAGE N/A 07/03/2016   Procedure: INCISION AND DRAINAGE of CHEST ABSCESS;  Surgeon: Millers Elspeth BROCKS, MD;  Location: [facility] OR;  Service: Thoracic;  Laterality: N/A;   JOINT REPLACEMENT     KNEE ARTHROSCOPY Left    MELANOMA EXCISION     upper back   PACEMAKER IMPLANT N/A 06/02/2016   Procedure: Pacemaker Implant;  Surgeon: Elspeth BROCKS Fernande, MD;  Location: [facility] INVASIVE CV LAB;  Service: Cardiovascular;  Laterality: N/A;   SHOULDER OPEN ROTATOR CUFF REPAIR Left    SKIN CANCER EXCISION Right    cheek; real deep   TEE WITHOUT CARDIOVERSION N/A 03/14/2015   Procedure: TRANSESOPHAGEAL ECHOCARDIOGRAM (TEE);  Surgeon: Elspeth BROCKS Millers, MD;  Location: [facility] OR;  Service: Open Heart Surgery;  Laterality: N/A;   TEE WITHOUT CARDIOVERSION N/A 12/19/2015   Procedure: TRANSESOPHAGEAL ECHOCARDIOGRAM (TEE);  Surgeon: Oneil BROCKS Parchment, MD;  Location: [facility] ENDOSCOPY;  Service: Cardiovascular;  Laterality: N/A;   TEE WITHOUT CARDIOVERSION N/A 03/24/2016   Procedure: TRANSESOPHAGEAL ECHOCARDIOGRAM (TEE);  Surgeon: Leim VEAR Moose, MD;  Location: [facility] ENDOSCOPY;  Service: Cardiovascular;  Laterality: N/A;   TONSILLECTOMY     TOTAL KNEE ARTHROPLASTY Right       Social History:     Social History   Tobacco Use   Smoking status: Former    Current packs/day: 0.00    Average packs/day: 1 pack/day for 50.0 years (50.0 ttl pk-yrs)    Types: Cigarettes  Start date:  02/04/1948    Quit date: 02/03/1998    Years since quitting: 25.6    Passive exposure: Past   Smokeless tobacco: Never   Tobacco comments:    07/02/2016 quit before 2000  Substance Use Topics   Alcohol use: No    Comment: 07/02/2016 quit before 2000         Family History :     Family History  Problem Relation Age of Onset   Diabetes Brother    Esophageal cancer Father    Colon cancer Neg Hx    Stomach cancer Neg Hx        Home Medications:   Prior to Admission medications   Medication Sig Start Date End Date Taking? Authorizing Provider  atorvastatin  (LIPITOR) 10 MG tablet TAKE 1 TABLET DAILY 09/09/23  Yes Laszlo, James W, MD  b complex vitamins capsule Take 1 capsule by mouth daily.   Yes [provider]  ELIQUIS  5 MG TABS tablet Take 1 tablet (5 mg total) by mouth 2 (two) times daily. 07/20/23  Yes Pietro Redell RAMAN, MD  losartan  (COZAAR ) 50 MG tablet Take 50 mg by mouth daily.   Yes [provider]  Multiple Vitamin (MULTIVITAMIN) tablet Take 1 tablet by mouth daily.   Yes [provider]  nitroGLYCERIN  (NITROSTAT ) 0.4 MG SL tablet Take 1 tablet under your tongue, while sitting.  If no relief of pain may repeat NTG, one tab every 5 minutes up to 3 tablets total over 15 minutes.  If no relief CALL 911. Patient taking differently: as needed. Take 1 tablet under your tongue, while sitting.  If no relief of pain may repeat NTG, one tab every 5 minutes up to 3 tablets total over 15 minutes.  If no relief CALL 911. 03/26/22  Yes Dann Candyce RAMAN, MD  Omega-3 Fatty Acids  (FISH OIL PO) Take 1,000 mg by mouth daily.   Yes [provider]  pantoprazole  (PROTONIX ) 40 MG tablet Take 1 tablet by mouth once daily Patient taking differently: Take 40 mg by mouth daily as needed. 01/30/22  Yes Norleen Lynwood ORN, MD  tamsulosin  (FLOMAX ) 0.4 MG CAPS capsule Take 1 capsule (0.4 mg total) by mouth in the morning and at bedtime. 11/28/22  Yes Norleen Lynwood ORN, MD      Allergies:     Allergies  Allergen Reactions   Amiodarone  Hcl Other (See Comments)    Patient reports photosensitivity   Ace Inhibitors Cough     Physical Exam:   Vitals  Blood pressure (!) 148/75, pulse 70, temperature 98.9 F (37.2 C), temperature source Oral, resp. rate 16, SpO2 98%.   1. General Pleasant elderly Caucasian male lying in hospital bed appears dehydrated and weak, extremely hard of hearing,  2. Normal affect and insight, Not Suicidal or Homicidal, Awake Alert,   3. No F.N deficits, ALL C.Nerves Intact, Strength 5/5 all 4 extremities, Sensation intact all 4 extremities, Plantars down going.  4. Ears and Eyes appear Normal, Conjunctivae clear, PERRLA. Moist Oral Mucosa.  5. Supple Neck, No JVD, No cervical lymphadenopathy appriciated, No Carotid Bruits.  6. Symmetrical Chest wall movement, Good air movement bilaterally, CTAB.  Left chest wall pacemaker site appears stable,  7. RRR, No Gallops, Rubs or Murmurs, No Parasternal Heave.  8. Positive Bowel Sounds, Abdomen Soft, No tenderness, No organomegaly appriciated,No rebound -guarding or rigidity.  9.  No Cyanosis, reduced skin Turgor, No Skin Rash or Bruise.  10. Good muscle tone,  joints appear  normal , no effusions, Normal ROM.  11. No Palpable Lymph Nodes in Neck or Axillae      Data Review:   Recent Labs  Lab 09/22/23 1504  WBC 4.5  HGB 13.8  HCT 40.6  PLT 165  MCV 97.4  MCH 33.1  MCHC 34.0  RDW 12.4  LYMPHSABS 1.2  MONOABS 1.0  EOSABS 0.0  BASOSABS 0.0    Recent Labs  Lab 09/22/23 1504 09/22/23 1713  NA 126*  --   K 4.3  --   CL 93*  --   CO2 23  --   ANIONGAP 10  --   GLUCOSE 90  --   BUN 14  --   CREATININE 0.93  --   AST 45*  --   ALT 23  --   ALKPHOS 77  --   BILITOT 0.9  --   ALBUMIN  3.2*  --   INR  --  1.3*  CALCIUM  8.7*  --     Lab Results  Component Value Date   CHOL 127 12/17/2022   HDL 57.60 12/17/2022   LDLCALC 59 12/17/2022   LDLDIRECT 159.2  09/29/2006   TRIG 53.0 12/17/2022   CHOLHDL 2 12/17/2022    Recent Labs  Lab 09/22/23 1504 09/22/23 1713  INR  --  1.3*  CALCIUM  8.7*  --     Recent Labs  Lab 09/22/23 1504  WBC 4.5  PLT 165  CREATININE 0.93    Urinalysis    Component Value Date/Time   COLORURINE YELLOW 09/22/2023 1504   APPEARANCEUR HAZY (A) 09/22/2023 1504   LABSPEC 1.013 09/22/2023 1504   PHURINE 6.0 09/22/2023 1504   GLUCOSEU NEGATIVE 09/22/2023 1504   GLUCOSEU NEGATIVE 12/17/2022 1412   HGBUR NEGATIVE 09/22/2023 1504   BILIRUBINUR NEGATIVE 09/22/2023 1504   KETONESUR 5 (A) 09/22/2023 1504   PROTEINUR NEGATIVE 09/22/2023 1504   UROBILINOGEN 0.2 12/17/2022 1412   NITRITE NEGATIVE 09/22/2023 1504   LEUKOCYTESUR NEGATIVE 09/22/2023 1504      Imaging Results:    DG Chest 1 View Result Date: 09/22/2023 CLINICAL DATA:  Weakness EXAM: CHEST  1 VIEW COMPARISON:  Chest x-ray 06/03/2016.  CT of the chest 06/12/2014. FINDINGS: Left-sided pacemaker is present. Patient is status post cardiac surgery. Cardiac silhouette is mildly enlarged. There is central pulmonary vascular congestion. Interstitial opacities are again seen throughout both lungs, not significantly changed. There is no new focal lung consolidation, pleural effusion or pneumothorax. No acute fractures are seen. IMPRESSION: 1. Cardiomegaly with central pulmonary vascular congestion. 2. Interstitial opacities throughout both lungs, not significantly changed. Findings may be related to chronic interstitial lung disease, interstitial edema or infection. Electronically Signed   By: Greig Pique M.D.   On: 09/22/2023 16:11   CT CERVICAL SPINE WO CONTRAST Result Date: 09/22/2023 CLINICAL DATA:  Multiple falls, hit head, anticoagulated EXAM: CT CERVICAL SPINE WITHOUT CONTRAST TECHNIQUE: Multidetector CT imaging of the cervical spine was performed without intravenous contrast. Multiplanar CT image reconstructions were also generated. RADIATION DOSE  REDUCTION: This exam was performed according to the departmental dose-optimization program which includes automated exposure control, adjustment of the mA and/or kV according to patient size and/or use of iterative reconstruction technique. COMPARISON:  None Available. FINDINGS: Alignment: There is degenerative anterolisthesis of C2 on C3 and C3 on C4. Otherwise alignment is anatomic. Skull base and vertebrae: No acute fracture. No primary bone lesion or focal pathologic process. Soft tissues and spinal canal: No prevertebral fluid or swelling. No visible canal hematoma. Disc  levels: Diffuse facet hypertrophic changes greatest from C2-3 through C4-5. Bony fusion across the left C4-5 facet. Multilevel spondylosis with disc space narrowing most pronounced from C4-5 through C6-7. Hypertrophic changes at the C1-C2 interface. Upper chest: Airway is patent. Emphysematous changes are seen at the lung apices. Other: Reconstructed images demonstrate no additional findings. IMPRESSION: 1. No acute cervical spine fracture. 2. Multilevel cervical degenerative changes as above. Electronically Signed   By: Ozell Daring M.D.   On: 09/22/2023 15:25   CT HEAD WO CONTRAST ( ) Result Date: 09/22/2023 CLINICAL DATA:  Multiple falls over the last month, hit head, anticoagulated EXAM: CT HEAD WITHOUT CONTRAST TECHNIQUE: Contiguous axial images were obtained from the base of the skull through the vertex without intravenous contrast. RADIATION DOSE REDUCTION: This exam was performed according to the departmental dose-optimization program which includes automated exposure control, adjustment of the mA and/or kV according to patient size and/or use of iterative reconstruction technique. COMPARISON:  None Available. FINDINGS: Brain: Confluent hypodensities are seen throughout the periventricular and subcortical white matter, most compatible with chronic small vessel ischemic changes. No evidence of acute infarct or hemorrhage. Lateral  ventricles and remaining midline structures are unremarkable. No acute extra-axial fluid collections. No mass effect. Age-appropriate cerebral cortical atrophy. Vascular: No hyperdense vessel or unexpected calcification. Skull: Normal. Negative for fracture or focal lesion. Sinuses/Orbits: No acute finding. Other: None. IMPRESSION: 1. No acute intracranial process. 2. Chronic small-vessel ischemic changes throughout the white matter and diffuse age-appropriate cerebral cortical atrophy. Electronically Signed   By: Ozell Daring M.D.   On: 09/22/2023 15:23    My personal review of EKG: Paced rhythm.   Assessment & Plan:    1.  Multiple falls, failure to thrive, dehydration and hyponatremia.  In elderly patient who is unable to care for himself at home, lives with elderly wife, on Eliquis .  At this time appears it appears that patient is dehydrated and could be causing his multiple falls, will obtain serum osmolality, urine electrolytes, check TSH, hydrate with IV fluids, check orthostatics and echocardiogram as well, PT eval, he has no headache or focal deficits.  Head CT is unremarkable.     2.  History of atrial fibrillation, ablation, pacemaker placement.  Monitor on telemetry, check echocardiogram, troponin mildly elevated but in non-ACS pattern and trend is downtrending, no chest pain, EKG is paced.  Continue home Eliquis .  3.  History of CAD with CABG.  Chest pain-free, EKG is paced, troponin trend is flat and in non-ACS pattern, no chest pain, obtain echocardiogram, continue Eliquis  and statin and monitor.    4.Hypertension.  Since he is dehydrated will avoid ARB, hydralazine  for now and monitor.   5.  Dyslipidemia.  Continue home dose statin.    6. BPH.  Continue Flomax     DVT Prophylaxis Eliquis   AM Labs Ordered, also please review Full Orders  Family Communication: Admission, patients condition and plan of care including tests being ordered have been discussed with the patient  and wife who indicate understanding and agree with the plan and Code Status.  Code Status Full  Likely DC to  TBD  Condition GUARDED    Consults called: None    Admission status: Inpt    Time spent in minutes : 35  Signature  -    Lavada Stank M.D on 09/22/2023 at 6:18 PM   -  To page go to [email or web address]

## 2023-09-22 NOTE — ED Provider Notes (Signed)
  EMERGENCY DEPARTMENT AT [facility] Provider Note   CSN: [id number] Arrival date & time: 09/22/23  1451     Patient presents with: Jonathan Orozco is a 89 y.o. male.   89 y.o male with a PMH of HTN, BBPV, presents to the ED via EMS with a chief complaint of fall from home on eliquis . Patient has had multiple falls over the last couple of days, he tells me he was standing then suddenly on the ground. Multiple falls over the weekend and refused EMS transport as well. He lives at home with his 23 year old wife. Not complaining of any pain at this time. No dizziness but has unsteady gate. No chest pain or SOB.   The history is provided by the patient.  Fall Pertinent negatives include no chest pain, no abdominal pain and no shortness of breath.       Prior to Admission medications   Medication Sig Start Date End Date Taking? Authorizing Provider  atorvastatin  (LIPITOR) 10 MG tablet TAKE 1 TABLET DAILY 09/09/23  Yes Dailon, James W, MD  b complex vitamins capsule Take 1 capsule by mouth daily.   Yes [provider]  ELIQUIS  5 MG TABS tablet Take 1 tablet (5 mg total) by mouth 2 (two) times daily. 07/20/23  Yes Pietro Redell RAMAN, MD  losartan  (COZAAR ) 50 MG tablet Take 50 mg by mouth daily.   Yes [provider]  Multiple Vitamin (MULTIVITAMIN) tablet Take 1 tablet by mouth daily.   Yes [provider]  nitroGLYCERIN  (NITROSTAT ) 0.4 MG SL tablet Take 1 tablet under your tongue, while sitting.  If no relief of pain may repeat NTG, one tab every 5 minutes up to 3 tablets total over 15 minutes.  If no relief CALL 911. Patient taking differently: as needed. Take 1 tablet under your tongue, while sitting.  If no relief of pain may repeat NTG, one tab every 5 minutes up to 3 tablets total over 15 minutes.  If no relief CALL 911. 03/26/22  Yes Dann Candyce RAMAN, MD  Omega-3 Fatty Acids  (FISH OIL PO) Take 1,000 mg by mouth daily.   Yes [provider]  pantoprazole  (PROTONIX ) 40 MG tablet Take 1 tablet by mouth once daily Patient taking differently: Take 40 mg by mouth daily as needed. 01/30/22  Yes Norleen Lynwood ORN, MD  tamsulosin  (FLOMAX ) 0.4 MG CAPS capsule Take 1 capsule (0.4 mg total) by mouth in the morning and at bedtime. 11/28/22  Yes Norleen Lynwood ORN, MD    Allergies: Amiodarone  hcl and Ace inhibitors    Review of Systems  Constitutional:  Negative for fever.  Respiratory:  Negative for shortness of breath.   Cardiovascular:  Negative for chest pain.  Gastrointestinal:  Negative for abdominal pain, nausea and vomiting.  Musculoskeletal:  Negative for back pain.  All other systems reviewed and are negative.   Updated Vital Signs BP (!) 147/77   Pulse 69   Temp 99.2 F (37.3 C) (Oral)   Resp 20   Ht 5' 10 (1.778 m)   Wt 78.5 kg   SpO2 95%   BMI 24.83 kg/m   Physical Exam Vitals and nursing note reviewed.  HENT:     Head: Normocephalic.     Comments: No obvious deformity.     Mouth/Throat:     Mouth: Mucous membranes are moist.  Cardiovascular:     Rate and Rhythm: Rhythm irregular.  Pulmonary:  Effort: Pulmonary effort is normal.     Breath sounds: No wheezing or rales.     Comments: No wheezing or rales.  Abdominal:     General: Abdomen is flat.     Palpations: Abdomen is soft.     Tenderness: There is no abdominal tenderness. There is no guarding.     Comments: No guarding.   Musculoskeletal:        General: Tenderness present.     Cervical back: Normal range of motion and neck supple.     Comments: Slight abrasion to the left knee.   Skin:    General: Skin is warm and dry.  Neurological:     Mental Status: He is alert. Mental status is at baseline.     (all labs ordered are listed, but only abnormal results are displayed) Labs Reviewed  COMPREHENSIVE METABOLIC PANEL WITH GFR - Abnormal; Notable for the following components:      Result Value   Sodium 126 (*)    Chloride 93 (*)     Calcium  8.7 (*)    Total Protein 6.3 (*)    Albumin  3.2 (*)    AST 45 (*)    All other components within normal limits  CBC WITH DIFFERENTIAL/PLATELET - Abnormal; Notable for the following components:   RBC 4.17 (*)    All other components within normal limits  URINALYSIS, ROUTINE W REFLEX MICROSCOPIC - Abnormal; Notable for the following components:   APPearance HAZY (*)    Ketones, ur 5 (*)    All other components within normal limits  BRAIN NATRIURETIC PEPTIDE - Abnormal; Notable for the following components:   B Natriuretic Peptide 135.8 (*)    All other components within normal limits  PROTIME-INR - Abnormal; Notable for the following components:   Prothrombin Time 17.2 (*)    INR 1.3 (*)    All other components within normal limits  OSMOLALITY - Abnormal; Notable for the following components:   Osmolality 270 (*)    All other components within normal limits  URIC ACID - Abnormal; Notable for the following components:   Uric Acid, Serum 2.8 (*)    All other components within normal limits  MAGNESIUM  - Abnormal; Notable for the following components:   Magnesium  1.6 (*)    All other components within normal limits  CBC WITH DIFFERENTIAL/PLATELET - Abnormal; Notable for the following components:   RBC 3.89 (*)    Hemoglobin 12.9 (*)    HCT 36.8 (*)    All other components within normal limits  COMPREHENSIVE METABOLIC PANEL WITH GFR - Abnormal; Notable for the following components:   Sodium 126 (*)    Chloride 92 (*)    Calcium  8.1 (*)    Total Protein 5.7 (*)    Albumin  2.8 (*)    AST 44 (*)    All other components within normal limits  CBC WITH DIFFERENTIAL/PLATELET - Abnormal; Notable for the following components:   RBC 4.17 (*)    Neutro Abs 1.6 (*)    All other components within normal limits  COMPREHENSIVE METABOLIC PANEL WITH GFR - Abnormal; Notable for the following components:   Sodium 127 (*)    Potassium 3.4 (*)    Chloride 93 (*)    Glucose, Bld 108  (*)    Calcium  8.2 (*)    Total Protein 5.7 (*)    Albumin  2.7 (*)    AST 43 (*)    All other components within normal limits  TROPONIN  I (HIGH SENSITIVITY) - Abnormal; Notable for the following components:   Troponin I (High Sensitivity) 33 (*)    All other components within normal limits  TROPONIN I (HIGH SENSITIVITY) - Abnormal; Notable for the following components:   Troponin I (High Sensitivity) 28 (*)    All other components within normal limits  OSMOLALITY, URINE  CREATININE, URINE, RANDOM  TSH  SODIUM, URINE, RANDOM  MAGNESIUM   PHOSPHORUS  SODIUM, URINE, RANDOM  UREA NITROGEN, URINE    EKG: None  Radiology: DG Chest 1 View Result Date: 09/22/2023 CLINICAL DATA:  Weakness EXAM: CHEST  1 VIEW COMPARISON:  Chest x-ray 06/03/2016.  CT of the chest 06/12/2014. FINDINGS: Left-sided pacemaker is present. Patient is status post cardiac surgery. Cardiac silhouette is mildly enlarged. There is central pulmonary vascular congestion. Interstitial opacities are again seen throughout both lungs, not significantly changed. There is no new focal lung consolidation, pleural effusion or pneumothorax. No acute fractures are seen. IMPRESSION: 1. Cardiomegaly with central pulmonary vascular congestion. 2. Interstitial opacities throughout both lungs, not significantly changed. Findings may be related to chronic interstitial lung disease, interstitial edema or infection. Electronically Signed   By: Greig Pique M.D.   On: 09/22/2023 16:11   CT CERVICAL SPINE WO CONTRAST Result Date: 09/22/2023 CLINICAL DATA:  Multiple falls, hit head, anticoagulated EXAM: CT CERVICAL SPINE WITHOUT CONTRAST TECHNIQUE: Multidetector CT imaging of the cervical spine was performed without intravenous contrast. Multiplanar CT image reconstructions were also generated. RADIATION DOSE REDUCTION: This exam was performed according to the departmental dose-optimization program which includes automated exposure control,  adjustment of the mA and/or kV according to patient size and/or use of iterative reconstruction technique. COMPARISON:  None Available. FINDINGS: Alignment: There is degenerative anterolisthesis of C2 on C3 and C3 on C4. Otherwise alignment is anatomic. Skull base and vertebrae: No acute fracture. No primary bone lesion or focal pathologic process. Soft tissues and spinal canal: No prevertebral fluid or swelling. No visible canal hematoma. Disc levels: Diffuse facet hypertrophic changes greatest from C2-3 through C4-5. Bony fusion across the left C4-5 facet. Multilevel spondylosis with disc space narrowing most pronounced from C4-5 through C6-7. Hypertrophic changes at the C1-C2 interface. Upper chest: Airway is patent. Emphysematous changes are seen at the lung apices. Other: Reconstructed images demonstrate no additional findings. IMPRESSION: 1. No acute cervical spine fracture. 2. Multilevel cervical degenerative changes as above. Electronically Signed   By: Ozell Daring M.D.   On: 09/22/2023 15:25   CT HEAD WO CONTRAST ( ) Result Date: 09/22/2023 CLINICAL DATA:  Multiple falls over the last month, hit head, anticoagulated EXAM: CT HEAD WITHOUT CONTRAST TECHNIQUE: Contiguous axial images were obtained from the base of the skull through the vertex without intravenous contrast. RADIATION DOSE REDUCTION: This exam was performed according to the departmental dose-optimization program which includes automated exposure control, adjustment of the mA and/or kV according to patient size and/or use of iterative reconstruction technique. COMPARISON:  None Available. FINDINGS: Brain: Confluent hypodensities are seen throughout the periventricular and subcortical white matter, most compatible with chronic small vessel ischemic changes. No evidence of acute infarct or hemorrhage. Lateral ventricles and remaining midline structures are unremarkable. No acute extra-axial fluid collections. No mass effect. Age-appropriate  cerebral cortical atrophy. Vascular: No hyperdense vessel or unexpected calcification. Skull: Normal. Negative for fracture or focal lesion. Sinuses/Orbits: No acute finding. Other: None. IMPRESSION: 1. No acute intracranial process. 2. Chronic small-vessel ischemic changes throughout the white matter and diffuse age-appropriate cerebral cortical atrophy. Electronically Signed  By: Ozell Daring M.D.   On: 09/22/2023 15:23     Procedures   Medications Ordered in the ED  atorvastatin  (LIPITOR) tablet 10 mg (10 mg Oral Given 09/24/23 0915)  apixaban  (ELIQUIS ) tablet 5 mg (5 mg Oral Given 09/24/23 0916)  nitroGLYCERIN  (NITROSTAT ) SL tablet 0.4 mg (has no administration in time range)  omega-3 acid ethyl esters (LOVAZA ) capsule 1 g (1 g Oral Given 09/24/23 0915)  multivitamin with minerals tablet 1 tablet (1 tablet Oral Given 09/24/23 0915)  acetaminophen  (TYLENOL ) tablet 650 mg (650 mg Oral Given 09/23/23 2142)    Or  acetaminophen  (TYLENOL ) suppository 650 mg ( Rectal See Alternative 09/23/23 2142)  magnesium  citrate solution 1 Bottle (has no administration in time range)  promethazine  (PHENERGAN ) tablet 12.5 mg (has no administration in time range)  hydrALAZINE  (APRESOLINE ) tablet 50 mg (50 mg Oral Given 09/24/23 0424)  tamsulosin  (FLOMAX ) capsule 0.4 mg (0.4 mg Oral Given 09/23/23 1701)  sodium chloride  tablet 1 g (1 g Oral Given 09/24/23 0916)  feeding supplement (ENSURE PLUS HIGH PROTEIN) liquid 237 mL (237 mLs Oral Given 09/24/23 1100)  traZODone  (DESYREL ) tablet 100 mg (100 mg Oral Given 09/23/23 2251)  haloperidol  lactate (HALDOL ) injection 2 mg (2 mg Intramuscular Given 09/24/23 0421)  magic mouthwash (has no administration in time range)  ofloxacin  (OCUFLOX ) 0.3 % ophthalmic solution 1 drop (has no administration in time range)  magnesium  sulfate IVPB 2 g 50 mL (0 g Intravenous Stopped 09/23/23 1549)  magnesium  sulfate IVPB 1 g 100 mL (1 g Intravenous New Bag/Given 09/24/23 1059)  potassium  chloride SA (KLOR-CON  M) CR tablet 40 mEq (40 mEq Oral Given 09/24/23 0915)    Clinical Course as of 09/24/23 1417  Tue Sep 22, 2023  1656 Sodium(!): 126 [JS]    Clinical Course User Index [JS] Sid Greener, PA-C                                 Medical Decision Making Amount and/or Complexity of Data Reviewed Labs: ordered. Decision-making details documented in ED Course. Radiology: ordered.  Risk Decision regarding hospitalization.   This patient presents to the ED for concern of fall, this involves a number of treatment options, and is a complaint that carries with it a high risk of complications and morbidity.  The differential diagnosis includes syncope, mechanical fall versus infection.    Co morbidities: Discussed in HPI   Brief History:  See HPI.   EMR reviewed including pt PMHx, past surgical history and past visits to ER.   See HPI for more details   Lab Tests:  I ordered and independently interpreted labs.  The pertinent results include:    CMP with electrolytes such as hyponatremia with a sodium of 126, the rest of his electrolytes are within normal limits aside from some slight decrease in his chloride.  Creatinine levels unremarkable.  LFTs slight elevation in AST, does have a prior history of alcohol abuse several years ago.  CBC with no leukocytosis, hemoglobin is within normal limits.  UA with no nitrates or leukocytes to suggest infection at this time.  First troponin is elevated at 33 will obtain delta.  He denies any chest pain on today's visit.   Imaging Studies:  Xray of the chest showed: IMPRESSION:  1. Cardiomegaly with central pulmonary vascular congestion.  2. Interstitial opacities throughout both lungs, not significantly  changed. Findings may be related to chronic interstitial  lung  disease, interstitial edema or infection.    Cardiac Monitoring:  The patient was maintained on a cardiac monitor.  I personally viewed and interpreted  the cardiac monitored which showed an underlying rhythm of:  EKG non-ischemic   Medicines ordered:  N/A  Reevaluation:  After the interventions noted above I re-evaluated patient and found that they have :stayed the same  Social Determinants of Health:  The patient's social determinants of health were a factor in the care of this patient  Problem List / ED Course:  Patient present to the emergency department after multiple falls over the weekend.  Has refused transport to the emergency department over the last couple of days.  Today he suffered another fall, he tells me that he did not strike his head or lose consciousness.  On evaluation there is no obvious deformity noted, he is very hard of hearing but he is alert and oriented x 3.  He does not have any obvious deformity to his head, CT head and cervical spine are negative without any acute findings.  According to his records he is anticoagulated on Eliquis  took the last dose this morning.SABRA  He moves all upper and lower extremities.  He has once again suffered multiple falls over the weekend therefore lab work was obtained.  CBC is unremarkable, hemoglobin stable.  CMP remarkable for some severe hyponatremia with a sodium of 126, does have a prior history of hyponatremia however this is the lowest it has been.  He does have a prior history of alcohol abuse but reports no longer drinking at this time.  His AST is slightly elevated at 45 but there is no abdominal pain, no guarding to suggest abdominal etiology at this time.  Chest x-ray with some vascular congestion and cardiomegaly.  He is satting at 98% on room air here without any tachypnea or tachycardia.  He tells me he has no fevers, no cough to suggest infection I do feel that this is likely volume overload component.  BNP has been added. Spoke to hospitalist service who agrees on admission at this time, will complete EKG prior to admission.  EKG with pacemaker rhythm present.  He is  hemodynamically stable for admission.  Dispostion:  After consideration of the diagnostic results and the patients response to treatment, I feel that the patent would benefit from admission for further management of their hyponatremia along with recurrent falls.    Portions of this note were generated with Scientist, clinical (histocompatibility and immunogenetics). Dictation errors may occur despite best attempts at proofreading.   Final diagnoses:  Fall, initial encounter  Hyponatremia    ED Discharge Orders     None          Nicholl Onstott, PA-C 09/24/23 1417    [place], [place], MD 09/27/23 [phone]

## 2023-09-22 NOTE — Assessment & Plan Note (Signed)
 Lab Results  Component Value Date   HGBA1C 5.8 12/17/2022   Stable, pt to continue current medical treatment  - diet ,w t control

## 2023-09-23 ENCOUNTER — Inpatient Hospital Stay (HOSPITAL_COMMUNITY)

## 2023-09-23 DIAGNOSIS — E871 Hypo-osmolality and hyponatremia: Secondary | ICD-10-CM | POA: Diagnosis not present

## 2023-09-23 LAB — CBC WITH DIFFERENTIAL/PLATELET
Abs Immature Granulocytes: 0.01 K/uL (ref 0.00–0.07)
Basophils Absolute: 0 K/uL (ref 0.0–0.1)
Basophils Relative: 0 %
Eosinophils Absolute: 0 K/uL (ref 0.0–0.5)
Eosinophils Relative: 0 %
HCT: 36.8 % — ABNORMAL LOW (ref 39.0–52.0)
Hemoglobin: 12.9 g/dL — ABNORMAL LOW (ref 13.0–17.0)
Immature Granulocytes: 0 %
Lymphocytes Relative: 25 %
Lymphs Abs: 1.3 K/uL (ref 0.7–4.0)
MCH: 33.2 pg (ref 26.0–34.0)
MCHC: 35.1 g/dL (ref 30.0–36.0)
MCV: 94.6 fL (ref 80.0–100.0)
Monocytes Absolute: 0.7 K/uL (ref 0.1–1.0)
Monocytes Relative: 14 %
Neutro Abs: 3.1 K/uL (ref 1.7–7.7)
Neutrophils Relative %: 61 %
Platelets: 168 K/uL (ref 150–400)
RBC: 3.89 MIL/uL — ABNORMAL LOW (ref 4.22–5.81)
RDW: 12.4 % (ref 11.5–15.5)
WBC: 5 K/uL (ref 4.0–10.5)
nRBC: 0 % (ref 0.0–0.2)

## 2023-09-23 LAB — COMPREHENSIVE METABOLIC PANEL WITH GFR
ALT: 24 U/L (ref 0–44)
AST: 44 U/L — ABNORMAL HIGH (ref 15–41)
Albumin: 2.8 g/dL — ABNORMAL LOW (ref 3.5–5.0)
Alkaline Phosphatase: 69 U/L (ref 38–126)
Anion gap: 12 (ref 5–15)
BUN: 12 mg/dL (ref 8–23)
CO2: 22 mmol/L (ref 22–32)
Calcium: 8.1 mg/dL — ABNORMAL LOW (ref 8.9–10.3)
Chloride: 92 mmol/L — ABNORMAL LOW (ref 98–111)
Creatinine, Ser: 0.91 mg/dL (ref 0.61–1.24)
GFR, Estimated: >60 mL/min (ref >60)
Glucose, Bld: 87 mg/dL (ref 70–99)
Potassium: 3.8 mmol/L (ref 3.5–5.1)
Sodium: 126 mmol/L — ABNORMAL LOW (ref 135–145)
Total Bilirubin: 0.8 mg/dL (ref 0.0–1.2)
Total Protein: 5.7 g/dL — ABNORMAL LOW (ref 6.5–8.1)

## 2023-09-23 LAB — ECHOCARDIOGRAM COMPLETE
Height: 70 in
Weight: 2768.98 [oz_av]

## 2023-09-23 LAB — URIC ACID: Uric Acid, Serum: 2.8 mg/dL — ABNORMAL LOW (ref 3.7–8.6)

## 2023-09-23 LAB — MAGNESIUM: Magnesium: 1.6 mg/dL — ABNORMAL LOW (ref 1.7–2.4)

## 2023-09-23 LAB — SODIUM, URINE, RANDOM: Sodium, Ur: 81 mmol/L

## 2023-09-23 MED ORDER — SODIUM CHLORIDE 0.9 % IV SOLN: INTRAVENOUS | Status: DC

## 2023-09-23 MED ORDER — SODIUM CHLORIDE 1 G PO TABS
1.0000 g | ORAL_TABLET | Freq: Two times a day (BID) | ORAL | Status: DC
  Administered 2023-09-23 – 2023-09-28 (×9): 1 g via ORAL
  Filled 2023-09-23 (×10): qty 1

## 2023-09-23 MED ORDER — TRAZODONE HCL 50 MG PO TABS
100.0000 mg | ORAL_TABLET | Freq: Every evening | ORAL | Status: DC | PRN
  Administered 2023-09-23 – 2023-09-26 (×4): 100 mg via ORAL
  Filled 2023-09-23 (×4): qty 2

## 2023-09-23 MED ORDER — ENSURE PLUS HIGH PROTEIN PO LIQD
237.0000 mL | Freq: Three times a day (TID) | ORAL | Status: DC
  Administered 2023-09-23 – 2023-09-26 (×9): 237 mL via ORAL
  Filled 2023-09-23: qty 237

## 2023-09-23 MED ORDER — HALOPERIDOL LACTATE 5 MG/ML IJ SOLN: 1.0000 mg | Freq: Four times a day (QID) | INTRAMUSCULAR | Status: DC | PRN

## 2023-09-23 MED ORDER — MAGNESIUM SULFATE 2 GM/50ML IV SOLN
2.0000 g | Freq: Once | INTRAVENOUS | Status: AC
  Administered 2023-09-23: 2 g via INTRAVENOUS
  Filled 2023-09-23: qty 50

## 2023-09-23 NOTE — Progress Notes (Signed)
 The patient declined the exam. The nurse and myself attempted to convince the patient to complete the exam. But he repeatedly declined it. Please attempt again if tomorrow or cancel, whatever is applicable.

## 2023-09-23 NOTE — Progress Notes (Signed)
 PROGRESS NOTE    Jonathan Orozco  FMW:[id number] DOB: 24-Oct-1928 DOA: 09/22/2023 PCP: Norleen Lynwood ORN, MD   Chief Complaint  Patient presents with   Fall    Brief Narrative:  Jonathan Orozco  is a 89 y.o. male, with history of atrial fibrillation on Eliquis , history of ablation and pacemaker placement, CAD s/p CABG, hypertension, dyslipidemia, diverticulosis, colonic polyps, BPPV, BPH who was in good health and lives with his elderly wife, however about 10 days ago while getting out of the bathtub he had a fall hit his head on the sink, at that time EMS came to the house but he refused to come to the hospital for evaluation, subsequently over the last 10 days he has had 2 other falls, has very difficult time getting around the house with a walker as his balance is very poor.     He denies any headache, no fever chills, no exposure to sick contacts, no cough or shortness of breath, no chest pain or palpitations, no diarrhea or dysuria, no nausea vomiting, no focal weakness, however he generally feels very weak all over.   In the ER he was found to be dehydrated, had very poor balance, multiple falls in the setting of Eliquis , living with her elderly wife and unable to care for himself. [facility] was called to admit.     Assessment & Plan:   Principal Problem:   Hyponatremia      Multiple falls, failure to thrive, dehydration and hyponatremia.   - Remains significantly frail, deconditioned, PT, OT consulted, likely will need SNF placement. -Mains with significant hyponatremia despite receiving IV fluids, serum osmolality is low, urine sodium is pending, concern for SIADH, will keep on fluid restriction, salt tablets - TSH within normal limit   History of atrial fibrillation, ablation, pacemaker placement.   - Continue with Eliquis  - 2D echo is pending  History of CAD with CABG.  -Chest pain-free, EKG is paced, troponin trend is flat and in non-ACS pattern, no chest pain, obtain  echocardiogram, continue Eliquis  and statin and monitor.     Hypertension.   - His ARB, continue with since he is dehydrated will avoid ARB, hydralazine  for now and monitor.   Hypomagnesemia -Replaced   Dyslipidemia.  Continue home dose statin.     BPH.  Continue Flomax    DVT prophylaxis: Eliquis  Code Status: Full Family Communication: none at bedside Disposition:   Status is: Inpatient    Consultants:  none   Subjective:  No significant events overnight as discussed with staff, does report generalized weakness, and unsteady gait  Objective: Vitals:   09/23/23 0329 09/23/23 0515 09/23/23 0800 09/23/23 1143  BP: (!) 140/76 132/83 132/83 (!) 140/89  Pulse: 69  70 70  Resp: 17  18 20   Temp: 97.8 F (36.6 C)  99.6 F (37.6 C) 98.5 F (36.9 C)  TempSrc: Oral  Oral Oral  SpO2: 91%  92%   Weight:      Height:        Intake/Output Summary (Last 24 hours) at 09/23/2023 1341 Last data filed at 09/23/2023 1100 Gross per 24 hour  Intake 1575.4 ml  Output 700 ml  Net 875.4 ml   Filed Weights   09/22/23 2116  Weight: 78.5 kg    Examination:  Awake Alert, extremely frail, deconditioned, chronically ill-appearing. Symmetrical Chest wall movement, Good air movement bilaterally, CTAB RRR,No Gallops,Rubs or new Murmurs, No Parasternal Heave +ve B.Sounds, Abd Soft, No tenderness, No rebound - guarding or  rigidity. No Cyanosis, Clubbing or edema, No new Rash or bruise      Data Reviewed: I have personally reviewed following labs and imaging studies  CBC: Recent Labs  Lab 09/22/23 1504 09/23/23 0514  WBC 4.5 5.0  NEUTROABS 2.2 3.1  HGB 13.8 12.9*  HCT 40.6 36.8*  MCV 97.4 94.6  PLT 165 168    Basic Metabolic Panel: Recent Labs  Lab 09/22/23 1504 09/23/23 0514  NA 126* 126*  K 4.3 3.8  CL 93* 92*  CO2 23 22  GLUCOSE 90 87  BUN 14 12  CREATININE 0.93 0.91  CALCIUM  8.7* 8.1*  MG  --  1.6*    GFR: Estimated Creatinine Clearance: 52.4 mL/min (by  C-G formula based on SCr of 0.91 mg/dL).  Liver Function Tests: Recent Labs  Lab 09/22/23 1504 09/23/23 0514  AST 45* 44*  ALT 23 24  ALKPHOS 77 69  BILITOT 0.9 0.8  PROT 6.3* 5.7*  ALBUMIN  3.2* 2.8*    CBG: No results for input(s): GLUCAP in the last 168 hours.   No results found for this or any previous visit (from the past 240 hours).       Radiology Studies: DG Chest 1 View Result Date: 09/22/2023 CLINICAL DATA:  Weakness EXAM: CHEST  1 VIEW COMPARISON:  Chest x-ray 06/03/2016.  CT of the chest 06/12/2014. FINDINGS: Left-sided pacemaker is present. Patient is status post cardiac surgery. Cardiac silhouette is mildly enlarged. There is central pulmonary vascular congestion. Interstitial opacities are again seen throughout both lungs, not significantly changed. There is no new focal lung consolidation, pleural effusion or pneumothorax. No acute fractures are seen. IMPRESSION: 1. Cardiomegaly with central pulmonary vascular congestion. 2. Interstitial opacities throughout both lungs, not significantly changed. Findings may be related to chronic interstitial lung disease, interstitial edema or infection. Electronically Signed   By: Greig Pique M.D.   On: 09/22/2023 16:11   CT CERVICAL SPINE WO CONTRAST Result Date: 09/22/2023 CLINICAL DATA:  Multiple falls, hit head, anticoagulated EXAM: CT CERVICAL SPINE WITHOUT CONTRAST TECHNIQUE: Multidetector CT imaging of the cervical spine was performed without intravenous contrast. Multiplanar CT image reconstructions were also generated. RADIATION DOSE REDUCTION: This exam was performed according to the departmental dose-optimization program which includes automated exposure control, adjustment of the mA and/or kV according to patient size and/or use of iterative reconstruction technique. COMPARISON:  None Available. FINDINGS: Alignment: There is degenerative anterolisthesis of C2 on C3 and C3 on C4. Otherwise alignment is anatomic. Skull  base and vertebrae: No acute fracture. No primary bone lesion or focal pathologic process. Soft tissues and spinal canal: No prevertebral fluid or swelling. No visible canal hematoma. Disc levels: Diffuse facet hypertrophic changes greatest from C2-3 through C4-5. Bony fusion across the left C4-5 facet. Multilevel spondylosis with disc space narrowing most pronounced from C4-5 through C6-7. Hypertrophic changes at the C1-C2 interface. Upper chest: Airway is patent. Emphysematous changes are seen at the lung apices. Other: Reconstructed images demonstrate no additional findings. IMPRESSION: 1. No acute cervical spine fracture. 2. Multilevel cervical degenerative changes as above. Electronically Signed   By: Ozell Daring M.D.   On: 09/22/2023 15:25   CT HEAD WO CONTRAST ( ) Result Date: 09/22/2023 CLINICAL DATA:  Multiple falls over the last month, hit head, anticoagulated EXAM: CT HEAD WITHOUT CONTRAST TECHNIQUE: Contiguous axial images were obtained from the base of the skull through the vertex without intravenous contrast. RADIATION DOSE REDUCTION: This exam was performed according to the departmental dose-optimization program which includes  automated exposure control, adjustment of the mA and/or kV according to patient size and/or use of iterative reconstruction technique. COMPARISON:  None Available. FINDINGS: Brain: Confluent hypodensities are seen throughout the periventricular and subcortical white matter, most compatible with chronic small vessel ischemic changes. No evidence of acute infarct or hemorrhage. Lateral ventricles and remaining midline structures are unremarkable. No acute extra-axial fluid collections. No mass effect. Age-appropriate cerebral cortical atrophy. Vascular: No hyperdense vessel or unexpected calcification. Skull: Normal. Negative for fracture or focal lesion. Sinuses/Orbits: No acute finding. Other: None. IMPRESSION: 1. No acute intracranial process. 2. Chronic small-vessel  ischemic changes throughout the white matter and diffuse age-appropriate cerebral cortical atrophy. Electronically Signed   By: Ozell Daring M.D.   On: 09/22/2023 15:23        Scheduled Meds:  apixaban   5 mg Oral BID   atorvastatin   10 mg Oral Daily   hydrALAZINE   50 mg Oral Q8H   multivitamin with minerals  1 tablet Oral Daily   omega-3 acid ethyl esters  1 g Oral Daily   tamsulosin   0.4 mg Oral QPC supper   Continuous Infusions:  sodium chloride      lactated ringers  75 mL/hr at 09/23/23 9160   magnesium  sulfate bolus IVPB       LOS: 1 day       Brayton Lye, MD [facility]   To contact the attending provider between 7A-7P or the covering provider during after hours 7P-7A, please log into the web site [email or web address] and access using universal [place] password for that web site. If you do not have the password, please call the hospital operator.  09/23/2023, 1:41 PM

## 2023-09-23 NOTE — Evaluation (Addendum)
 Physical Therapy Evaluation Patient Details Name: Jonathan Orozco MRN: [id number] DOB: [birth year] Today's Date: 09/23/2023  History of Present Illness  Pt is a 89 y.o. M who presents 09/22/2023 who presents with multiple falls, failure to thrive, dehydration, and hyponatremia. Head CT is unremarkable. Significant PMH: atrial fibrillation on Eliquis , history of ablation and pacemaker placement, CAD s/p CABG, HTN, BPPV.  Clinical Impression  Pt admitted with above. Unable to obtain PLOF and home living due to pt extremely HOH (hearing aids not charged) and pt unable to read lips. Pt can read written communication. Attempted to call pt spouse on home and cell number and went to voicemail. Pt presents with generalized weakness, impaired sitting and standing balance, debility/frailty, and cognitive deficits. Pt requiring two person min assist for transitions to standing and pivoting to and from bedside commode. Assist provided for peri care. Pt with very poor insight and awareness into deficits. He is at high risk for falls. Patient will benefit from continued inpatient follow up therapy, <3 hours/day.      If plan is discharge home, recommend the following: A lot of help with walking and/or transfers;A lot of help with bathing/dressing/bathroom;Assistance with cooking/housework;Assist for transportation;Help with stairs or ramp for entrance;Supervision due to cognitive status;Direct supervision/assist for medications management;Direct supervision/assist for financial management   Can travel by private vehicle   No    Equipment Recommendations BSC/3in1;Wheelchair (measurements PT);Wheelchair cushion (measurements PT)  Recommendations for Other Services       Functional Status Assessment Patient has had a recent decline in their functional status and demonstrates the ability to make significant improvements in function in a reasonable and predictable amount of time.     Precautions / Restrictions  Precautions Precautions: Fall Restrictions Weight Bearing Restrictions Per Provider Order: No      Mobility  Bed Mobility Overal bed mobility: Needs Assistance Bed Mobility: Supine to Sit, Sit to Supine     Supine to sit: Min assist, HOB elevated, Used rails Sit to supine: Mod assist   General bed mobility comments: Increased time, use of both rails, assist to scoot L hip out to edge of bed with use of bed. LE elevation provided to get back into bed    Transfers Overall transfer level: Needs assistance Equipment used: Rolling walker (2 wheels) Transfers: Sit to/from Stand, Bed to chair/wheelchair/BSC Sit to Stand: Min assist, +2 physical assistance Stand pivot transfers: Min assist, +2 physical assistance         General transfer comment: MinA + 2 to power up to stand, hand over hand guidance for hand placement, pivoting to R to bedside commode with cueing for sequencing/initiation. Shuffling steps and decreased bilateral foot clearance, increased trunk/hip flexion    Ambulation/Gait                  Stairs            Wheelchair Mobility     Tilt Bed    Modified Rankin (Stroke Patients Only)       Balance Overall balance assessment: Needs assistance Sitting-balance support: Feet supported Sitting balance-Leahy Scale: Fair     Standing balance support: Bilateral upper extremity supported Standing balance-Leahy Scale: Poor                               Pertinent Vitals/Pain Pain Assessment Pain Assessment: Faces Faces Pain Scale: No hurt    Home Living Family/patient expects to be discharged to::  Private residence Living Arrangements: Spouse/significant other Available Help at Discharge: Family               Additional Comments: Unable to obtain home living    Prior Function Prior Level of Function : Needs assist             Mobility Comments: Using walker per chart review ADLs Comments: Unable to obtain PLOF      Extremity/Trunk Assessment   Upper Extremity Assessment Upper Extremity Assessment: Generalized weakness    Lower Extremity Assessment Lower Extremity Assessment: Generalized weakness    Cervical / Trunk Assessment Cervical / Trunk Assessment: Kyphotic  Communication   Communication Communication: Impaired Factors Affecting Communication: Hearing impaired    Cognition Arousal: Alert Behavior During Therapy: WFL for tasks assessed/performed   PT - Cognitive impairments: Difficult to assess, Awareness, Attention, Initiation, Sequencing, Problem solving, Safety/Judgement Difficult to assess due to: Hard of hearing/deaf                     PT - Cognition Comments: Pt extremely HOH, hearing aids were not charged, able to read written communication and respond appropriately. Pt with very poor insight into deficits. Pt reporting, I will feel better when my wife is here, and also has high fear of falling Following commands: Impaired Following commands impaired: Follows one step commands with increased time     Cueing Cueing Techniques: Verbal cues, Gestural cues, Visual cues     General Comments  BP sitting 148/67 (99), SpO2 95% on RA, HR 70    Exercises     Assessment/Plan    PT Assessment Patient needs continued PT services  PT Problem List Decreased strength;Decreased activity tolerance;Decreased mobility;Decreased balance;Decreased cognition;Decreased safety awareness       PT Treatment Interventions DME instruction;Gait training;Functional mobility training;Therapeutic activities;Therapeutic exercise;Balance training;Patient/family education    PT Goals (Current goals can be found in the Care Plan section)  Acute Rehab PT Goals Patient Stated Goal: not fall PT Goal Formulation: With patient Time For Goal Achievement: 10/07/23 Potential to Achieve Goals: Fair    Frequency Min 2X/week     Co-evaluation PT/OT/SLP Co-Evaluation/Treatment: Yes Reason  for Co-Treatment: Complexity of the patient's impairments (multi-system involvement);Necessary to address cognition/behavior during functional activity;For patient/therapist safety;To address functional/ADL transfers PT goals addressed during session: Mobility/safety with mobility         AM-PAC PT 6 Clicks Mobility  Outcome Measure Help needed turning from your back to your side while in a flat bed without using bedrails?: A Little Help needed moving from lying on your back to sitting on the side of a flat bed without using bedrails?: A Little Help needed moving to and from a bed to a chair (including a wheelchair)?: A Lot Help needed standing up from a chair using your arms (e.g., wheelchair or bedside chair)?: A Lot Help needed to walk in hospital room?: Total Help needed climbing 3-5 steps with a railing? : Total 6 Click Score: 12    End of Session Equipment Utilized During Treatment: Gait belt Activity Tolerance: Patient tolerated treatment well Patient left: in bed;with call bell/phone within reach;with bed alarm set Nurse Communication: Mobility status PT Visit Diagnosis: Unsteadiness on feet (R26.81);Muscle weakness (generalized) (M62.81);Difficulty in walking, not elsewhere classified (R26.2);History of falling (Z91.81)    Time: 9145-9068 PT Time Calculation (min) (ACUTE ONLY): 37 min   Charges:   PT Evaluation $PT Eval Moderate Complexity: 1 Mod   PT General Charges $$ ACUTE PT VISIT: 1  Visit       Aleck Daring, PT, DPT Acute Rehabilitation Services Office [phone]   Alayne ONEIDA Daring 09/23/2023, 10:16 AM

## 2023-09-23 NOTE — Evaluation (Signed)
 Occupational Therapy Evaluation Patient Details Name: Jonathan Orozco MRN: [id number] DOB: 1928-07-27 Today's Date: 09/23/2023   History of Present Illness   Pt is a 89 y.o. M who presents 09/22/2023 who presents with multiple falls, failure to thrive, dehydration, and hyponatremia. Head CT is unremarkable. Significant PMH: atrial fibrillation on Eliquis , history of ablation and pacemaker placement, CAD s/p CABG, HTN, BPPV.     Clinical Impressions Pt EOB with PT on arrival and PT agreeable to dovetail for initial transfer assessment. Pt very hard of hearing and thus difficulty providing PLOF; also seems to be a poor historian. Upon eval, pt presents with decreased strength, balance, safety, awareness. Pt needing up to min A for UB ADL and mod-max for LB ADL. Pt to continue to benefit from acute OT services. Patient will benefit from continued inpatient follow up therapy, <3 hours/day      If plan is discharge home, recommend the following:   A lot of help with walking and/or transfers;Two people to help with walking and/or transfers;A lot of help with bathing/dressing/bathroom;Assistance with cooking/housework;Assist for transportation;Help with stairs or ramp for entrance     Functional Status Assessment   Patient has had a recent decline in their functional status and demonstrates the ability to make significant improvements in function in a reasonable and predictable amount of time.     Equipment Recommendations   Other (comment) (defer)     Recommendations for Other Services         Precautions/Restrictions   Precautions Precautions: Fall Restrictions Weight Bearing Restrictions Per Provider Order: No     Mobility Bed Mobility Overal bed mobility: Needs Assistance Bed Mobility: Supine to Sit, Sit to Supine     Supine to sit: Min assist, HOB elevated, Used rails Sit to supine: Mod assist   General bed mobility comments: Increased time, use of both rails,  assist to scoot L hip out to edge of bed with use of bed. LE elevation provided to get back into bed    Transfers Overall transfer level: Needs assistance Equipment used: Rolling walker (2 wheels) Transfers: Sit to/from Stand, Bed to chair/wheelchair/BSC Sit to Stand: Min assist, +2 physical assistance Stand pivot transfers: Min assist, +2 physical assistance         General transfer comment: MinA + 2 to power up to stand, hand over hand guidance for hand placement, pivoting to R to bedside commode with cueing for sequencing/initiation. Shuffling steps and decreased bilateral foot clearance, increased trunk/hip flexion      Balance Overall balance assessment: Needs assistance Sitting-balance support: Feet supported Sitting balance-Leahy Scale: Fair     Standing balance support: Bilateral upper extremity supported Standing balance-Leahy Scale: Poor                             ADL either performed or assessed with clinical judgement   ADL Overall ADL's : Needs assistance/impaired Eating/Feeding: Set up;Bed level   Grooming: Minimal assistance;Sitting   Upper Body Bathing: Sitting;Minimal assistance   Lower Body Bathing: Maximal assistance;Sit to/from stand   Upper Body Dressing : Minimal assistance;Sitting   Lower Body Dressing: Maximal assistance;Sit to/from stand   Toilet Transfer: Minimal assistance;+2 for safety/equipment;+2 for physical assistance;Stand-pivot;BSC/3in1;Rolling walker (2 wheels)   Toileting- Clothing Manipulation and Hygiene: Total assistance;Sit to/from stand Toileting - Clothing Manipulation Details (indicate cue type and reason): BM in session and total A for pericare     Functional mobility during ADLs: Minimal assistance;+2  for physical assistance;+2 for safety/equipment (SPT)       Vision   Additional Comments: not formally assessed. Able to locate items in room     Perception         Praxis         Pertinent  Vitals/Pain Pain Assessment Pain Assessment: Faces Faces Pain Scale: No hurt     Extremity/Trunk Assessment Upper Extremity Assessment Upper Extremity Assessment: Generalized weakness   Lower Extremity Assessment Lower Extremity Assessment: Generalized weakness   Cervical / Trunk Assessment Cervical / Trunk Assessment: Kyphotic   Communication Communication Communication: Impaired Factors Affecting Communication: Hearing impaired   Cognition Arousal: Alert Behavior During Therapy: WFL for tasks assessed/performed Cognition: Difficult to assess Difficult to assess due to: Hard of hearing/deaf           OT - Cognition Comments: can follow commands but limited by hearing.                 Following commands: Impaired Following commands impaired: Follows one step commands with increased time     Cueing  General Comments   Cueing Techniques: Verbal cues;Gestural cues;Visual cues      Exercises     Shoulder Instructions      Home Living Family/patient expects to be discharged to:: Private residence Living Arrangements: Spouse/significant other Available Help at Discharge: Family                             Additional Comments: Unable to obtain home living      Prior Functioning/Environment Prior Level of Function : Needs assist             Mobility Comments: Using walker per chart review ADLs Comments: Unable to obtain PLOF    OT Problem List: Decreased strength;Decreased activity tolerance;Impaired balance (sitting and/or standing);Decreased safety awareness;Decreased knowledge of use of DME or AE   OT Treatment/Interventions: Self-care/ADL training;Therapeutic exercise;DME and/or AE instruction;Therapeutic activities;Patient/family education;Balance training      OT Goals(Current goals can be found in the care plan section)   Acute Rehab OT Goals Patient Stated Goal: get better OT Goal Formulation: With patient Time For Goal  Achievement: 10/07/23 Potential to Achieve Goals: Good   OT Frequency:  Min 2X/week    Co-evaluation PT/OT/SLP Co-Evaluation/Treatment: Yes Reason for Co-Treatment: Complexity of the patient's impairments (multi-system involvement);Necessary to address cognition/behavior during functional activity;For patient/therapist safety;To address functional/ADL transfers PT goals addressed during session: Mobility/safety with mobility OT goals addressed during session: ADL's and self-care      AM-PAC OT 6 Clicks Daily Activity     Outcome Measure Help from another person eating meals?: A Little Help from another person taking care of personal grooming?: A Little Help from another person toileting, which includes using toliet, bedpan, or urinal?: A Lot Help from another person bathing (including washing, rinsing, drying)?: A Lot Help from another person to put on and taking off regular upper body clothing?: A Little Help from another person to put on and taking off regular lower body clothing?: A Lot 6 Click Score: 15   End of Session Equipment Utilized During Treatment: Gait belt;Rolling walker (2 wheels);Other (comment) [facility]) Nurse Communication: Mobility status  Activity Tolerance: Patient tolerated treatment well Patient left: in bed;with call bell/phone within reach;with bed alarm set  OT Visit Diagnosis: Unsteadiness on feet (R26.81);Muscle weakness (generalized) (M62.81);History of falling (Z91.81);Repeated falls (R29.6)  Time: 0910-0930 OT Time Calculation (min): 20 min Charges:  OT General Charges $OT Visit: 1 Visit OT Evaluation $OT Eval Low Complexity: 1 Low  Elma JONETTA Lebron FREDERICK, OTR/L [facility] Acute Rehabilitation Office: [phone]   Elma JONETTA Lebron 09/23/2023, 12:50 PM

## 2023-09-23 NOTE — Progress Notes (Signed)
 Attempted Echocardiogram, patient refused. He explained his heart is in good shape and he doesn't need any additional testing.

## 2023-09-23 NOTE — Plan of Care (Signed)

## 2023-09-23 NOTE — Progress Notes (Signed)
 [facility] Dr. Sundil paged via chat that patient was pulling off leads and attempting to remove IV Mittens were placed and Tylenol  650 mg given. New orders received

## 2023-09-23 NOTE — [place] FL2 (Signed)
 [place]  MEDICAID FL2 LEVEL OF CARE FORM     IDENTIFICATION  Patient Name: Jonathan Orozco Birthdate: 1928-07-19 Sex: male Admission Date (Current Location): 09/22/2023  [facility] and [place] Number:  Producer, television/film/video and Address:  [facility], [address], [place], [place] [address]      Provider Number: [id number]  Attending Physician Name and Address:  Elgergawy, Brayton RAMAN, MD  Relative Name and Phone Number:       Current Level of Care: Hospital Recommended Level of Care: Skilled Nursing Facility Prior Approval Number:    Date Approved/Denied:   PASRR Number: [id number]  Discharge Plan: SNF    Current Diagnoses: Patient Active Problem List   Diagnosis Date Noted   Recurrent falls 09/22/2023   Hyponatremia 09/22/2023   Generalized weakness 12/20/2022   Depression 08/16/2022   Generalized arthritis 03/11/2022   PSA elevation 03/04/2022   Symptomatic bradycardia 02/05/2022   Atrial fibrillation (HCC) 02/05/2022   (HFpEF) heart failure with preserved ejection fraction (HCC) 02/05/2022   Pacemaker 02/05/2022   Elbow wound 12/17/2021   Benign prostatic hyperplasia with urinary frequency 08/31/2021   Encounter for competency evaluation 10/27/2020   Encounter for chronic pain management 01/14/2020   Hypertension, uncontrolled 01/11/2020   GERD (gastroesophageal reflux disease) 11/12/2019   Heme positive stool 11/10/2019   Anticoagulated by anticoagulation treatment 05/31/2019   Epistaxis 05/31/2019   Acute gouty arthritis 04/29/2019   Left arm swelling 01/05/2018   IBS (irritable bowel syndrome) 06/30/2017   Bilateral impacted cerumen 11/11/2016   Imbalance 11/11/2016   Encounter for well adult exam with abnormal findings 06/19/2016   Elevated PSA 06/19/2016   Gait disorder 01/27/2016   Chronic anticoagulation 12/18/2015   Hyperglycemia 06/17/2015   CAD (coronary artery disease), native coronary artery 03/12/2015   Atrial  fibrillation with rapid ventricular response (HCC)    Solitary pulmonary nodule 05/24/2013   Anxiety state 05/21/2012   Chronic low back pain 05/21/2012   Long term (current) use of anticoagulants 04/24/2012   Hearing loss of both ears 11/11/2010   Fatigue 11/16/2009   VERTIGO 04/08/2007   MELANOMA, MALIGNANT, SKIN NOS 09/29/2006   Hyperlipidemia 09/29/2006   ERECTILE DYSFUNCTION 09/29/2006   VENTRICULAR TACHYCARDIA 09/29/2006   BENIGN PROSTATIC HYPERTROPHY 09/29/2006   OSTEOARTHROSIS NOS, OTHER SPEC SITE 09/29/2006    Orientation RESPIRATION BLADDER Height & Weight     Self, Time, Place, Situation  Normal Incontinent, External catheter Weight: 173 lb 1 oz (78.5 kg) Height:  5' 10 (177.8 cm)  BEHAVIORAL SYMPTOMS/MOOD NEUROLOGICAL BOWEL NUTRITION STATUS      Continent Diet (See dc summary)  AMBULATORY STATUS COMMUNICATION OF NEEDS Skin   Extensive Assist Verbally Normal                       Personal Care Assistance Level of Assistance  Bathing, Feeding, Dressing Bathing Assistance: Maximum assistance Feeding assistance: Limited assistance Dressing Assistance: Limited assistance     Functional Limitations Info  Sight, Hearing Sight Info: Impaired Hearing Info: Impaired      SPECIAL CARE FACTORS FREQUENCY  PT (By licensed PT), OT (By licensed OT)     PT Frequency: 5x/week OT Frequency: 5x/week            Contractures Contractures Info: Not present    Additional Factors Info  Code Status, Allergies Code Status Info: Full Allergies Info: Amiodarone  Hcl, Ace Inhibitors           Current Medications (09/23/2023):  This is the current hospital active medication list Current Facility-Administered Medications  Medication Dose Route Frequency Provider Last Rate Last Admin   acetaminophen  (TYLENOL ) tablet 650 mg  650 mg Oral Q6H PRN Singh, Prashant K, MD       Or   acetaminophen  (TYLENOL ) suppository 650 mg  650 mg Rectal Q6H PRN Singh, Prashant K, MD        apixaban  (ELIQUIS ) tablet 5 mg  5 mg Oral BID Singh, Prashant K, MD   5 mg at 09/23/23 0836   atorvastatin  (LIPITOR) tablet 10 mg  10 mg Oral Daily Singh, Prashant K, MD   10 mg at 09/23/23 0836   hydrALAZINE  (APRESOLINE ) tablet 50 mg  50 mg Oral Q8H Singh, Prashant K, MD   50 mg at 09/23/23 0515   lactated ringers  infusion   Intravenous Continuous Singh, Prashant K, MD 75 mL/hr at 09/23/23 0839 New Bag at 09/23/23 0839   magnesium  citrate solution 1 Bottle  1 Bottle Oral Once PRN Singh, Prashant K, MD       multivitamin with minerals tablet 1 tablet  1 tablet Oral Daily Singh, Prashant K, MD   1 tablet at 09/23/23 9163   nitroGLYCERIN  (NITROSTAT ) SL tablet 0.4 mg  0.4 mg Sublingual Q5 min PRN Singh, Prashant K, MD       omega-3 acid ethyl esters (LOVAZA ) capsule 1 g  1 g Oral Daily Singh, Prashant K, MD   1 g at 09/23/23 9163   promethazine  (PHENERGAN ) tablet 12.5 mg  12.5 mg Oral Q6H PRN Singh, Prashant K, MD       tamsulosin  (FLOMAX ) capsule 0.4 mg  0.4 mg Oral QPC supper Segars, Jonathan, MD   0.4 mg at 09/22/23 2303     Discharge Medications: Please see discharge summary for a list of discharge medications.  Relevant Imaging Results:  Relevant Lab Results:   Additional Information SSN  [id number]  Inocente GORMAN Kindle, LCSW

## 2023-09-23 NOTE — TOC Initial Note (Addendum)
 Transition of Care [facility]) - Initial/Assessment Note    Patient Details  Name: Jonathan Orozco MRN: [id number] Date of Birth: 06/10/1928  Transition [facility]) CM/SW Contact:    Inocente GORMAN Kindle, LCSW Phone Number: 09/23/2023, 12:25 PM  Clinical Narrative:                 12:25pm-CSW received consult for possible SNF placement at time of discharge. CSW spoke with patient's spouse. Patient reported that patient's spouse is currently unable to care for patient at their home given patient's current physical needs and fall risk. Patient's spouse expressed understanding of PT recommendation and is agreeable to SNF placement at time of discharge. Patient's spouse reports preference for [facility] and 2-[facility]. CSW discussed insurance authorization process and will provide Medicare SNF ratings list. CSW will send out referrals for review and provide bed offers as available.   3:27 PM-[facility] able to accept patient tomorrow if stable. CSW updated patient's spouse and received insurance approval, Ref# [id number], effective 09/24/2023-09/28/2023.   Patient will require PTAR for transport per wife.      Skilled Nursing Rehab Facilities-   [email or web address]   Ratings out of 5 stars (5 the highest)  Name Address  Phone # Quality Care Staffing Health Inspection Overall  [facility] [address] [phone] 2 1 2 1   [facility] [address] [place] [phone] 5 2 4 5   [facility] [address], [facility] [phone] 2 1 1 1   [facility] [address] [place] [phone] 4 3 4 4   [facility]  [address], [place] [phone] 4 3 5 5   [facility] [address] [facility] [phone] 5 3 2 3   [facility] [address], [place] [phone] 5 1 2 2   [facility] [address] [place] [phone] 3 4 4 4   [address] ([facility]) [address] [place]  [phone] 1 3 3 2   [facility] [address] [place], [place] [phone] 4 2 2 2   [facility] ([place]) [address], [place] [phone] 2 1 1 1   Clotilda Pereyra [address] [place] [phone] 2 4 4 4   [facility] [address] [place] [phone] 3 1 3 2   [facility] ([facility]) [address], [place] [phone] 1 2 4 3           [facility] [address], [place] [phone] 3 1 5 4   [facility] [address], [place] [phone] 4 2 1 1   [facility]  [address] [place] [phone] 2 4 1 1   [facility] [address] [phone] 2 2 5 5   [facility] [address] [place] [address], [place] [phone] 2 2 3 3           [facility] [address], [place] [phone] 2 1 2 1   [facility] (No [facility]) [address], [place] [phone] 4 3 4 4   [facility] [address], [facility] [phone] 3  5 5   [facility] [address], [place] [phone] 4 2 1 1   [facility] [address] [place] [phone] 3 1 2 1   [facility] [address], [place] [phone] 1 3 3 2   [facility] [address] [place] [phone] 2 2 3 3   [facility] [address] [place], [place] [phone] 2 1 4 3   [facility] [address],  [place] [phone] 2 1 1 1   [facility] [address] [place] [phone] 2 1 2 1   [facility] [address] [place] [phone] 3 1 2 1           [facility] [address], [place] [phone] 4 1 3 2   [facility] [address], [place]  [phone] 2 4 4 4   [facility] (No Hum[employer]) [address] [place] [phone] 3 2 5 5   [facility]) [address], [place] [phone] 3 2 3 3   [facility] [address] [place] [phone] 5 3 5 5   [facility] [address], [place] [phone] 2 1 1 1   [facility] [address] [place], [place] [phone] 3 5 5 5           [facility] [address] [place], [place] [phone] 5 4 5 5   [facility])  [address], [place] [phone] 1 1 2 1   [facility]) [address] [place] [phone]  2 4 4   [facility] [address], [place] [phone] 3 5 5 5   [address] [place], [place] [phone] 4 2 2 2   [facility] [address] [place] [phone] 1 1 3 1   [facility] [address] [place] [phone] 2 2 2 2      Expected Discharge Plan: Skilled Nursing Facility Barriers to Discharge: Continued Medical Work up, English as a second language teacher, SNF Pending bed offer   Patient Goals and CMS Choice Patient states their goals for this hospitalization and ongoing recovery are:: Rehab CMS Medicare.gov Compare Post Acute Care list provided to:: Patient Represenative (must comment) Choice offered to / list presented to : Spouse [place] ownership interest in [facility].provided to:: Spouse    Expected Discharge Plan and Services In-house Referral: Clinical Social Work   Post Acute Care Choice: Skilled Nursing Facility Living arrangements for the past 2 months: Single Family Home                                      Prior Living Arrangements/Services Living arrangements for the past 2 months: Single Family Home Lives with:: Spouse Patient language and need for interpreter reviewed:: Yes Do you feel safe going back to the place where you live?: Yes      Need for Family Participation in Patient Care: Yes (Comment) Care giver support system in place?: Yes (comment)   Criminal Activity/Legal Involvement Pertinent to Current Situation/Hospitalization: No - Comment as needed  Activities of Daily Living   ADL Screening (condition at  time of admission) Independently performs ADLs?: No Does the patient have a NEW difficulty with bathing/dressing/toileting/self-feeding that is expected to last >3 days?: Yes (Initiates electronic notice to provider for possible OT consult) Does the patient have a NEW difficulty with getting in/out of bed, walking, or climbing stairs that is expected to last >3 days?: Yes (Initiates electronic notice to provider for possible PT consult) Does the patient have a NEW difficulty with communication that is expected to last >3 days?: Yes (Initiates electronic notice to provider for possible SLP consult) Is the patient deaf or have difficulty hearing?: Yes Does the patient have difficulty seeing, even when wearing glasses/contacts?: No Does the patient have difficulty concentrating, remembering, or making decisions?: No  Permission Sought/Granted  Permission sought to share information with : Facility Medical sales representative, Family Supports Permission granted to share information with : Yes, Verbal Permission Granted  Share Information with NAME: Quiggle,Catherine -Spouse [phone]  Permission granted to share info w AGENCY: SNFs        Emotional Assessment Appearance:: Appears stated age Attitude/Demeanor/Rapport: Engaged Affect (typically observed): Accepting Orientation: : Oriented to Self, Oriented to Place, Oriented to  Time, Oriented to Situation Alcohol / Substance Use: Not Applicable Psych Involvement: No (comment)  Admission diagnosis:  Hyponatremia [E87.1] Fall, initial encounter [W19.XXXA] Patient Active Problem List   Diagnosis Date Noted   Recurrent falls 09/22/2023   Hyponatremia 09/22/2023   Generalized weakness 12/20/2022   Depression 08/16/2022   Generalized arthritis 03/11/2022   PSA elevation 03/04/2022   Symptomatic bradycardia 02/05/2022   Atrial fibrillation (HCC) 02/05/2022   (HFpEF) heart failure with preserved ejection fraction (HCC) 02/05/2022   Pacemaker  02/05/2022   Elbow wound 12/17/2021   Benign prostatic hyperplasia with urinary frequency 08/31/2021   Encounter for competency evaluation 10/27/2020   Encounter for chronic pain management 01/14/2020   Hypertension, uncontrolled 01/11/2020   GERD (gastroesophageal reflux disease) 11/12/2019   Heme positive stool 11/10/2019   Anticoagulated by anticoagulation treatment 05/31/2019   Epistaxis 05/31/2019   Acute gouty arthritis 04/29/2019   Left arm swelling 01/05/2018   IBS (irritable bowel syndrome) 06/30/2017   Bilateral impacted cerumen 11/11/2016   Imbalance 11/11/2016   Encounter for well adult exam with abnormal findings 06/19/2016   Elevated PSA 06/19/2016   Gait disorder 01/27/2016   Chronic anticoagulation 12/18/2015   Hyperglycemia 06/17/2015   CAD (coronary artery disease), native coronary artery 03/12/2015   Atrial fibrillation with rapid ventricular response (HCC)    Solitary pulmonary nodule 05/24/2013   Anxiety state 05/21/2012   Chronic low back pain 05/21/2012   Long term (current) use of anticoagulants 04/24/2012   Hearing loss of both ears 11/11/2010   Fatigue 11/16/2009   VERTIGO 04/08/2007   MELANOMA, MALIGNANT, SKIN NOS 09/29/2006   Hyperlipidemia 09/29/2006   ERECTILE DYSFUNCTION 09/29/2006   VENTRICULAR TACHYCARDIA 09/29/2006   BENIGN PROSTATIC HYPERTROPHY 09/29/2006   OSTEOARTHROSIS NOS, OTHER SPEC SITE 09/29/2006   PCP:  Norleen Lynwood ORN, MD Pharmacy:   [facility] [address] [place] - [address] [place] [address] Phone: [phone] Fax: [phone]  [facility] [place], [place] - [facility] AT Portal to Registered [facility] [address] [place] [address] Phone: [phone] Fax: [phone]     Social Drivers of Health (SDOH) Social History: SDOH Screenings   Food Insecurity: No Food Insecurity (09/22/2023)  Housing: Low Risk   (09/22/2023)  Transportation Needs: No Transportation Needs (09/22/2023)  Utilities: Not At Risk (09/22/2023)  Alcohol Screen: Low Risk  (04/13/2023)  Depression (PHQ2-9): Low Risk  (09/22/2023)  Financial Resource Strain: Low Risk  (04/13/2023)  Physical Activity: Insufficiently Active (04/13/2023)  Social Connections: Moderately Integrated (09/22/2023)  Stress: No Stress Concern Present (04/13/2023)  Tobacco Use: Medium Risk (09/22/2023)  Health Literacy: Adequate Health Literacy (04/13/2023)   SDOH Interventions:     Readmission Risk Interventions     No data to display

## 2023-09-23 NOTE — Plan of Care (Signed)
  Problem: Clinical Measurements: Goal: Ability to maintain clinical measurements within normal limits will improve Outcome: Progressing Goal: Will remain free from infection Outcome: Progressing Goal: Diagnostic test results will improve Outcome: Progressing Goal: Respiratory complications will improve Outcome: Progressing Goal: Cardiovascular complication will be avoided Outcome: Progressing   Problem: Activity: Goal: Risk for activity intolerance will decrease Outcome: Progressing   Problem: Nutrition: Goal: Adequate nutrition will be maintained Outcome: Progressing   Problem: Coping: Goal: Level of anxiety will decrease Outcome: Progressing   Problem: Elimination: Goal: Will not experience complications related to bowel motility Outcome: Progressing Goal: Will not experience complications related to urinary retention Outcome: Progressing   Problem: Pain Managment: Goal: General experience of comfort will improve and/or be controlled Outcome: Progressing   Problem: Safety: Goal: Ability to remain free from injury will improve Outcome: Progressing

## 2023-09-24 ENCOUNTER — Inpatient Hospital Stay (HOSPITAL_COMMUNITY)

## 2023-09-24 DIAGNOSIS — E871 Hypo-osmolality and hyponatremia: Secondary | ICD-10-CM | POA: Diagnosis not present

## 2023-09-24 LAB — COMPREHENSIVE METABOLIC PANEL WITH GFR
ALT: 26 U/L (ref 0–44)
AST: 43 U/L — ABNORMAL HIGH (ref 15–41)
Albumin: 2.7 g/dL — ABNORMAL LOW (ref 3.5–5.0)
Alkaline Phosphatase: 71 U/L (ref 38–126)
Anion gap: 10 (ref 5–15)
BUN: 12 mg/dL (ref 8–23)
CO2: 24 mmol/L (ref 22–32)
Calcium: 8.2 mg/dL — ABNORMAL LOW (ref 8.9–10.3)
Chloride: 93 mmol/L — ABNORMAL LOW (ref 98–111)
Creatinine, Ser: 0.88 mg/dL (ref 0.61–1.24)
GFR, Estimated: >60 mL/min (ref >60)
Glucose, Bld: 108 mg/dL — ABNORMAL HIGH (ref 70–99)
Potassium: 3.4 mmol/L — ABNORMAL LOW (ref 3.5–5.1)
Sodium: 127 mmol/L — ABNORMAL LOW (ref 135–145)
Total Bilirubin: 0.7 mg/dL (ref 0.0–1.2)
Total Protein: 5.7 g/dL — ABNORMAL LOW (ref 6.5–8.1)

## 2023-09-24 LAB — CBC WITH DIFFERENTIAL/PLATELET
Abs Immature Granulocytes: 0.01 K/uL (ref 0.00–0.07)
Basophils Absolute: 0 K/uL (ref 0.0–0.1)
Basophils Relative: 0 %
Eosinophils Absolute: 0 K/uL (ref 0.0–0.5)
Eosinophils Relative: 0 %
HCT: 39.5 % (ref 39.0–52.0)
Hemoglobin: 13.7 g/dL (ref 13.0–17.0)
Immature Granulocytes: 0 %
Lymphocytes Relative: 47 %
Lymphs Abs: 1.9 K/uL (ref 0.7–4.0)
MCH: 32.9 pg (ref 26.0–34.0)
MCHC: 34.7 g/dL (ref 30.0–36.0)
MCV: 94.7 fL (ref 80.0–100.0)
Monocytes Absolute: 0.6 K/uL (ref 0.1–1.0)
Monocytes Relative: 14 %
Neutro Abs: 1.6 K/uL — ABNORMAL LOW (ref 1.7–7.7)
Neutrophils Relative %: 39 %
Platelets: 178 K/uL (ref 150–400)
RBC: 4.17 MIL/uL — ABNORMAL LOW (ref 4.22–5.81)
RDW: 12.1 % (ref 11.5–15.5)
WBC: 4.1 K/uL (ref 4.0–10.5)
nRBC: 0 % (ref 0.0–0.2)

## 2023-09-24 LAB — MAGNESIUM: Magnesium: 1.8 mg/dL (ref 1.7–2.4)

## 2023-09-24 LAB — PHOSPHORUS: Phosphorus: 2.9 mg/dL (ref 2.5–4.6)

## 2023-09-24 MED ORDER — MAGNESIUM SULFATE IN D5W 1-5 GM/100ML-% IV SOLN
1.0000 g | Freq: Once | INTRAVENOUS | Status: AC
  Administered 2023-09-24: 1 g via INTRAVENOUS
  Filled 2023-09-24 (×2): qty 100

## 2023-09-24 MED ORDER — MAGIC MOUTHWASH
5.0000 mL | Freq: Four times a day (QID) | ORAL | Status: AC
  Administered 2023-09-24 – 2023-09-26 (×10): 5 mL via ORAL
  Filled 2023-09-24 (×12): qty 5

## 2023-09-24 MED ORDER — QUETIAPINE FUMARATE 25 MG PO TABS
25.0000 mg | ORAL_TABLET | Freq: Every day | ORAL | Status: DC
  Administered 2023-09-24 – 2023-09-27 (×4): 25 mg via ORAL
  Filled 2023-09-24 (×4): qty 1

## 2023-09-24 MED ORDER — HALOPERIDOL LACTATE 5 MG/ML IJ SOLN
2.0000 mg | Freq: Four times a day (QID) | INTRAMUSCULAR | Status: DC | PRN
  Administered 2023-09-24 – 2023-09-26 (×2): 2 mg via INTRAMUSCULAR
  Filled 2023-09-24 (×2): qty 1

## 2023-09-24 MED ORDER — POTASSIUM CHLORIDE CRYS ER 20 MEQ PO TBCR
40.0000 meq | EXTENDED_RELEASE_TABLET | Freq: Once | ORAL | Status: AC
  Administered 2023-09-24: 40 meq via ORAL
  Filled 2023-09-24: qty 2

## 2023-09-24 MED ORDER — OFLOXACIN 0.3 % OP SOLN
1.0000 [drp] | Freq: Four times a day (QID) | OPHTHALMIC | Status: DC
  Administered 2023-09-24 – 2023-09-28 (×15): 1 [drp] via OPHTHALMIC
  Filled 2023-09-24: qty 5

## 2023-09-24 NOTE — Progress Notes (Signed)
 Dr. Sundil  have Haldol  IM patient removed IV and attempting to get OOB

## 2023-09-24 NOTE — Plan of Care (Signed)
 Patient is agitated and delirious.  He removed his IV access and very agitated.  Given patient has high risk of fall with advanced age and development of delirium in hospital setting plan to continue IM Haldol  2 mg every 6 hours as needed, continue fall precaution, continue delirium precaution and requesting for bedside TeleSitter for safety monitoring specifically for fall precaution agitation and anxiety.

## 2023-09-24 NOTE — Progress Notes (Signed)
 Attempted Echocardiogram, patient is unable to sit through the exam. Per MD exam is cancelled and will reorder if needed.

## 2023-09-24 NOTE — Plan of Care (Signed)
  Problem: Education: Goal: Knowledge of General Education information will improve Description: Including pain rating scale, medication(s)/side effects and non-pharmacologic comfort measures Outcome: Not Progressing   Problem: Clinical Measurements: Goal: Ability to maintain clinical measurements within normal limits will improve Outcome: Progressing Goal: Cardiovascular complication will be avoided Outcome: Progressing   Problem: Coping: Goal: Level of anxiety will decrease Outcome: Not Progressing

## 2023-09-24 NOTE — Progress Notes (Signed)
 PROGRESS NOTE    Jonathan Orozco  FMW:[id number] DOB: 1928-04-08 DOA: 09/22/2023 PCP: Norleen Lynwood ORN, MD   Chief Complaint  Patient presents with   Fall    Brief Narrative:  Jonathan Orozco  is a 89 y.o. male, with history of atrial fibrillation on Eliquis , history of ablation and pacemaker placement, CAD s/p CABG, hypertension, dyslipidemia, diverticulosis, colonic polyps, BPPV, BPH who was in good health and lives with his elderly wife, however about 10 days ago while getting out of the bathtub he had a fall hit his head on the sink, at that time EMS came to the house but he refused to come to the hospital for evaluation, subsequently over the last 10 days he has had 2 other falls, has very difficult time getting around the house with a walker as his balance is very poor.     He denies any headache, no fever chills, no exposure to sick contacts, no cough or shortness of breath, no chest pain or palpitations, no diarrhea or dysuria, no nausea vomiting, no focal weakness, however he generally feels very weak all over.   In the ER he was found to be dehydrated, had very poor balance, multiple falls in the setting of Eliquis , living with her elderly wife and unable to care for himself. [facility] was called to admit.     Assessment & Plan:   Principal Problem:   Hyponatremia      Multiple falls, failure to thrive, dehydration and hyponatremia.   - Remains significantly frail, deconditioned, PT, OT consulted, will need SNF placement. 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 - TSH within normal limit  Acute metabolic encephalopathy Hospital delirium -The wife reports that he is with a gradual incline, she has noted significant decline on Sunday, I will obtain MRI brain to rule out acute CVA. - He is with significant confusion over the light  in the setting of hospital delirium I will start on low-dose Seroquel    History of atrial fibrillation, ablation, pacemaker placement.   - Continue with Eliquis  - 2D echo is pending (he has refused couple times)  History of CAD with CABG.  -Chest pain-free, EKG is paced, troponin trend is flat and in non-ACS pattern, no chest pain, obtain echocardiogram, continue Eliquis  and statin and monitor.     Hypertension.   - His ARB, continue with since he is dehydrated will avoid ARB, hydralazine  for now and monitor.   Hypomagnesemia -Replaced   Dyslipidemia.  Continue home dose statin.    Hypokalemia -Replaced   BPH.  Continue Flomax    DVT prophylaxis: Eliquis  Code Status: Full Family Communication: Discussed with wife at bedside Disposition:   Status is: Inpatient    Consultants:  none   Subjective:  He is with some confusion and restlessness overnight, appetite was poor this morning Objective: Vitals:   09/24/23 0751 09/24/23 0800 09/24/23 1154 09/24/23 1200  BP: 126/75 128/84 (!) 149/82 (!) 147/77  Pulse:  73  69  Resp:  15 20 20   Temp: (!) 97.5 F (36.4 C)  99.2 F (37.3 C)   TempSrc: Oral  Oral   SpO2:  94%  95%  Weight:      Height:        Intake/Output Summary (Last 24 hours) at 09/24/2023 1502 Last data filed at 09/24/2023 1100 Gross per  24 hour  Intake 538.57 ml  Output 1650 ml  Net -1111.43 ml   Filed Weights   09/22/23 2116  Weight: 78.5 kg    Examination:  Awake Alert, extremely frail, deconditioned, chronically ill-appearing.,  Confused Symmetrical Chest wall movement, Good air movement bilaterally, CTAB RRR,No Gallops,Rubs or new Murmurs, No Parasternal Heave +ve B.Sounds, Abd Soft, No tenderness, No rebound - guarding or rigidity. No Cyanosis, Clubbing or edema, No new Rash or bruise       Data Reviewed: I have personally reviewed following labs and imaging studies  CBC: Recent Labs  Lab 09/22/23 1504 09/23/23 0514  09/24/23 0453  WBC 4.5 5.0 4.1  NEUTROABS 2.2 3.1 1.6*  HGB 13.8 12.9* 13.7  HCT 40.6 36.8* 39.5  MCV 97.4 94.6 94.7  PLT 165 168 178    Basic Metabolic Panel: Recent Labs  Lab 09/22/23 1504 09/23/23 0514 09/24/23 0453  NA 126* 126* 127*  K 4.3 3.8 3.4*  CL 93* 92* 93*  CO2 23 22 24   GLUCOSE 90 87 108*  BUN 14 12 12   CREATININE 0.93 0.91 0.88  CALCIUM  8.7* 8.1* 8.2*  MG  --  1.6* 1.8  PHOS  --   --  2.9    GFR: Estimated Creatinine Clearance: 54.2 mL/min (by C-G formula based on SCr of 0.88 mg/dL).  Liver Function Tests: Recent Labs  Lab 09/22/23 1504 09/23/23 0514 09/24/23 0453  AST 45* 44* 43*  ALT 23 24 26   ALKPHOS 77 69 71  BILITOT 0.9 0.8 0.7  PROT 6.3* 5.7* 5.7*  ALBUMIN  3.2* 2.8* 2.7*    CBG: No results for input(s): GLUCAP in the last 168 hours.   No results found for this or any previous visit (from the past 240 hours).       Radiology Studies: DG Chest 1 View Result Date: 09/22/2023 CLINICAL DATA:  Weakness EXAM: CHEST  1 VIEW COMPARISON:  Chest x-ray 06/03/2016.  CT of the chest 06/12/2014. FINDINGS: Left-sided pacemaker is present. Patient is status post cardiac surgery. Cardiac silhouette is mildly enlarged. There is central pulmonary vascular congestion. Interstitial opacities are again seen throughout both lungs, not significantly changed. There is no new focal lung consolidation, pleural effusion or pneumothorax. No acute fractures are seen. IMPRESSION: 1. Cardiomegaly with central pulmonary vascular congestion. 2. Interstitial opacities throughout both lungs, not significantly changed. Findings may be related to chronic interstitial lung disease, interstitial edema or infection. Electronically Signed   By: Greig Pique M.D.   On: 09/22/2023 16:11   CT CERVICAL SPINE WO CONTRAST Result Date: 09/22/2023 CLINICAL DATA:  Multiple falls, hit head, anticoagulated EXAM: CT CERVICAL SPINE WITHOUT CONTRAST TECHNIQUE: Multidetector CT imaging of  the cervical spine was performed without intravenous contrast. Multiplanar CT image reconstructions were also generated. RADIATION DOSE REDUCTION: This exam was performed according to the departmental dose-optimization program which includes automated exposure control, adjustment of the mA and/or kV according to patient size and/or use of iterative reconstruction technique. COMPARISON:  None Available. FINDINGS: Alignment: There is degenerative anterolisthesis of C2 on C3 and C3 on C4. Otherwise alignment is anatomic. Skull base and vertebrae: No acute fracture. No primary bone lesion or focal pathologic process. Soft tissues and spinal canal: No prevertebral fluid or swelling. No visible canal hematoma. Disc levels: Diffuse facet hypertrophic changes greatest from C2-3 through C4-5. Bony fusion across the left C4-5 facet. Multilevel spondylosis with disc space narrowing most pronounced from C4-5 through C6-7. Hypertrophic changes at the C1-C2 interface. Upper chest: Airway  is patent. Emphysematous changes are seen at the lung apices. Other: Reconstructed images demonstrate no additional findings. IMPRESSION: 1. No acute cervical spine fracture. 2. Multilevel cervical degenerative changes as above. Electronically Signed   By: Ozell Daring M.D.   On: 09/22/2023 15:25   CT HEAD WO CONTRAST ( ) Result Date: 09/22/2023 CLINICAL DATA:  Multiple falls over the last month, hit head, anticoagulated EXAM: CT HEAD WITHOUT CONTRAST TECHNIQUE: Contiguous axial images were obtained from the base of the skull through the vertex without intravenous contrast. RADIATION DOSE REDUCTION: This exam was performed according to the departmental dose-optimization program which includes automated exposure control, adjustment of the mA and/or kV according to patient size and/or use of iterative reconstruction technique. COMPARISON:  None Available. FINDINGS: Brain: Confluent hypodensities are seen throughout the periventricular and  subcortical white matter, most compatible with chronic small vessel ischemic changes. No evidence of acute infarct or hemorrhage. Lateral ventricles and remaining midline structures are unremarkable. No acute extra-axial fluid collections. No mass effect. Age-appropriate cerebral cortical atrophy. Vascular: No hyperdense vessel or unexpected calcification. Skull: Normal. Negative for fracture or focal lesion. Sinuses/Orbits: No acute finding. Other: None. IMPRESSION: 1. No acute intracranial process. 2. Chronic small-vessel ischemic changes throughout the white matter and diffuse age-appropriate cerebral cortical atrophy. Electronically Signed   By: Ozell Daring M.D.   On: 09/22/2023 15:23        Scheduled Meds:  apixaban   5 mg Oral BID   atorvastatin   10 mg Oral Daily   feeding supplement  237 mL Oral TID BM   hydrALAZINE   50 mg Oral Q8H   magic mouthwash  5 mL Oral QID   multivitamin with minerals  1 tablet Oral Daily   ofloxacin   1 drop Both Eyes QID   omega-3 acid ethyl esters  1 g Oral Daily   sodium chloride   1 g Oral BID WC   tamsulosin   0.4 mg Oral QPC supper   Continuous Infusions:     LOS: 2 days       Brayton Lye, MD [facility]   To contact the attending provider between 7A-7P or the covering provider during after hours 7P-7A, please log into the web site [email or web address] and access using universal [place] password for that web site. If you do not have the password, please call the hospital operator.  09/24/2023, 3:02 PM

## 2023-09-25 ENCOUNTER — Inpatient Hospital Stay (HOSPITAL_COMMUNITY)

## 2023-09-25 DIAGNOSIS — E871 Hypo-osmolality and hyponatremia: Secondary | ICD-10-CM | POA: Diagnosis not present

## 2023-09-25 LAB — COMPREHENSIVE METABOLIC PANEL WITH GFR
ALT: 24 U/L (ref 0–44)
AST: 36 U/L (ref 15–41)
Albumin: 2.7 g/dL — ABNORMAL LOW (ref 3.5–5.0)
Alkaline Phosphatase: 68 U/L (ref 38–126)
Anion gap: 8 (ref 5–15)
BUN: 12 mg/dL (ref 8–23)
CO2: 24 mmol/L (ref 22–32)
Calcium: 8.2 mg/dL — ABNORMAL LOW (ref 8.9–10.3)
Chloride: 99 mmol/L (ref 98–111)
Creatinine, Ser: 0.94 mg/dL (ref 0.61–1.24)
GFR, Estimated: >60 mL/min (ref >60)
Glucose, Bld: 97 mg/dL (ref 70–99)
Potassium: 4 mmol/L (ref 3.5–5.1)
Sodium: 131 mmol/L — ABNORMAL LOW (ref 135–145)
Total Bilirubin: 1 mg/dL (ref 0.0–1.2)
Total Protein: 5.7 g/dL — ABNORMAL LOW (ref 6.5–8.1)

## 2023-09-25 LAB — CBC WITH DIFFERENTIAL/PLATELET
Abs Immature Granulocytes: 0.01 K/uL (ref 0.00–0.07)
Basophils Absolute: 0 K/uL (ref 0.0–0.1)
Basophils Relative: 0 %
Eosinophils Absolute: 0 K/uL (ref 0.0–0.5)
Eosinophils Relative: 0 %
HCT: 39.2 % (ref 39.0–52.0)
Hemoglobin: 13.4 g/dL (ref 13.0–17.0)
Immature Granulocytes: 0 %
Lymphocytes Relative: 40 %
Lymphs Abs: 1.7 K/uL (ref 0.7–4.0)
MCH: 32.2 pg (ref 26.0–34.0)
MCHC: 34.2 g/dL (ref 30.0–36.0)
MCV: 94.2 fL (ref 80.0–100.0)
Monocytes Absolute: 0.5 K/uL (ref 0.1–1.0)
Monocytes Relative: 12 %
Neutro Abs: 2 K/uL (ref 1.7–7.7)
Neutrophils Relative %: 48 %
Platelets: 177 K/uL (ref 150–400)
RBC: 4.16 MIL/uL — ABNORMAL LOW (ref 4.22–5.81)
RDW: 12.3 % (ref 11.5–15.5)
WBC: 4.2 K/uL (ref 4.0–10.5)
nRBC: 0 % (ref 0.0–0.2)

## 2023-09-25 LAB — MAGNESIUM: Magnesium: 1.9 mg/dL (ref 1.7–2.4)

## 2023-09-25 MED ORDER — LORAZEPAM 2 MG/ML IJ SOLN
0.5000 mg | Freq: Once | INTRAMUSCULAR | Status: AC
  Administered 2023-09-25: 0.5 mg via INTRAVENOUS
  Filled 2023-09-25: qty 1

## 2023-09-25 NOTE — Plan of Care (Signed)
   Problem: Education: Goal: Knowledge of General Education information will improve Description Including pain rating scale, medication(s)/side effects and non-pharmacologic comfort measures Outcome: Progressing   Problem: Health Behavior/Discharge Planning: Goal: Ability to manage health-related needs will improve Outcome: Progressing

## 2023-09-25 NOTE — TOC Progression Note (Signed)
 Transition of Care [facility]) - Progression Note    Patient Details  Name: Jonathan Orozco MRN: [id number] Date of Birth: 1928/07/02  Transition of Care [facility]) CM/SW Contact  Inocente GORMAN Kindle, LCSW Phone Number: 09/25/2023, 3:31 PM  Clinical Narrative:    CSW provided update to [place]. Insurance approval effective until 8/26 at midnight.    Expected Discharge Plan: Skilled Nursing Facility Barriers to Discharge: Continued Medical Work up               Expected Discharge Plan and Services In-house Referral: Clinical Social Work   Post Acute Care Choice: Skilled Nursing Facility Living arrangements for the past 2 months: Single Family Home                                       Social Drivers of Health (SDOH) Interventions SDOH Screenings   Food Insecurity: No Food Insecurity (09/22/2023)  Housing: Low Risk  (09/22/2023)  Transportation Needs: No Transportation Needs (09/22/2023)  Utilities: Not At Risk (09/22/2023)  Alcohol Screen: Low Risk  (04/13/2023)  Depression (PHQ2-9): Low Risk  (09/22/2023)  Financial Resource Strain: Low Risk  (04/13/2023)  Physical Activity: Insufficiently Active (04/13/2023)  Social Connections: Moderately Integrated (09/22/2023)  Stress: No Stress Concern Present (04/13/2023)  Tobacco Use: Medium Risk (09/22/2023)  Health Literacy: Adequate Health Literacy (04/13/2023)    Readmission Risk Interventions     No data to display

## 2023-09-25 NOTE — Progress Notes (Signed)
 Physical Therapy Treatment Patient Details Name: Jonathan Orozco MRN: [id number] DOB: [birth year] Today's Date: 09/25/2023   History of Present Illness Pt is a 89 y.o. M who presents 09/22/2023 who presents with multiple falls, failure to thrive, dehydration, and hyponatremia. Head CT is unremarkable, MRI brain pending. Significant PMH: atrial fibrillation on Eliquis , history of ablation and pacemaker placement, CAD s/p CABG, HTN, BPPV.   PT Comments  Pt was lethargic upon arrival with RN mentioning pt was sedated for procedure this morning. TotalAx2 required for bed mobility with improved alertness once seated on EOB. MinA/CGA required for seated balance due to anterior and slight left lateral lean. Pt with significantly forward flexed trunk and inability to lift head against gravity. Assist for neck extension PROM with resistance noted. Returned pt back to supine and moved into chair position. Difficulty understanding pt throughout with occasional nonsensical speech. Continue to recommend <3hrs post acute rehab to decrease caregiver burden and improve quality of life. Acute PT to follow.    If plan is discharge home, recommend the following: A lot of help with walking and/or transfers;A lot of help with bathing/dressing/bathroom;Assistance with cooking/housework;Assist for transportation;Help with stairs or ramp for entrance;Supervision due to cognitive status;Direct supervision/assist for medications management;Direct supervision/assist for financial management   Can travel by private vehicle     No  Equipment Recommendations  BSC/3in1;Wheelchair (measurements PT);Wheelchair cushion (measurements PT);Hospital bed;Hoyer lift       Precautions / Restrictions Precautions Precautions: Fall Recall of Precautions/Restrictions: Impaired Restrictions Weight Bearing Restrictions Per Provider Order: No     Mobility  Bed Mobility Overal bed mobility: Needs Assistance Bed Mobility: Supine to Sit, Sit  to Supine    Supine to sit: Total assist, +2 for physical assistance, +2 for safety/equipment Sit to supine: Total assist, +2 for physical assistance, +2 for safety/equipment   General bed mobility comments: unable to assist physically with TotalAx2 and use of bed pad    Transfers    General transfer comment: Deferred 2/2 safety    Balance Overall balance assessment: Needs assistance, History of Falls Sitting-balance support: Feet supported Sitting balance-Leahy Scale: Poor Sitting balance - Comments: MinA to CGA for anterior and slightly left lateral lean Postural control: Left lateral lean, Other (comment) (anterior)     Communication Communication Communication: Impaired Factors Affecting Communication: Hearing impaired;Difficulty expressing self;Reduced clarity of speech  Cognition Arousal: Lethargic Behavior During Therapy: Flat affect   PT - Cognitive impairments: Difficult to assess Difficult to assess due to: Hard of hearing/deaf, Level of arousal, Impaired communication    PT - Cognition Comments: lethargic upon arrival with increased alertness once seated on EOB. Difficult to understand with occasional nonsensical speech. Following commands: Impaired Following commands impaired: Follows one step commands inconsistently    Cueing Cueing Techniques: Verbal cues, Gestural cues, Visual cues         Pertinent Vitals/Pain Pain Assessment Pain Assessment: PAINAD Breathing: normal Negative Vocalization: none Facial Expression: smiling or inexpressive Body Language: tense, distressed pacing, fidgeting Consolability: no need to console PAINAD Score: 1 Pain Intervention(s): Limited activity within patient's tolerance, Monitored during session, Repositioned     PT Goals (current goals can now be found in the care plan section) Acute Rehab PT Goals PT Goal Formulation: With patient Time For Goal Achievement: 10/07/23 Potential to Achieve Goals: Fair Progress towards  PT goals: Not progressing toward goals - comment (decreased alertness and ability to follow commands)    Frequency    Min 2X/week  Co-evaluation   Reason for Co-Treatment: Complexity of the patient's impairments (multi-system involvement);Necessary to address cognition/behavior during functional activity;For patient/therapist safety;To address functional/ADL transfers PT goals addressed during session: Mobility/safety with mobility;Balance OT goals addressed during session: ADL's and self-care      AM-PAC PT 6 Clicks Mobility   Outcome Measure  Help needed turning from your back to your side while in a flat bed without using bedrails?: Total Help needed moving from lying on your back to sitting on the side of a flat bed without using bedrails?: Total Help needed moving to and from a bed to a chair (including a wheelchair)?: Total Help needed standing up from a chair using your arms (e.g., wheelchair or bedside chair)?: Total Help needed to walk in hospital room?: Total Help needed climbing 3-5 steps with a railing? : Total 6 Click Score: 6    End of Session   Activity Tolerance: Other (comment) (limited by cognition) Patient left: in bed;with call bell/phone within reach;with bed alarm set Nurse Communication: Mobility status PT Visit Diagnosis: Unsteadiness on feet (R26.81);Muscle weakness (generalized) (M62.81);Difficulty in walking, not elsewhere classified (R26.2);History of falling (Z91.81)     Time: 8697-8668 PT Time Calculation (min) (ACUTE ONLY): 29 min  Charges:    $Therapeutic Activity: 8-22 mins PT General Charges $$ ACUTE PT VISIT: 1 Visit                     Kate ORN, PT, DPT Secure Chat Preferred  Rehab Office [phone]   Kate BRAVO Wendolyn 09/25/2023, 2:11 PM

## 2023-09-25 NOTE — Progress Notes (Signed)
 PROGRESS NOTE    Jonathan Orozco  FMW:[id number] DOB: [birth year] DOA: 09/22/2023 PCP: Norleen Lynwood ORN, MD   Chief Complaint  Patient presents with   Fall    Brief Narrative:  Jonathan Orozco  is a 89 y.o. male, with history of atrial fibrillation on Eliquis , history of ablation and pacemaker placement, CAD s/p CABG, hypertension, dyslipidemia, diverticulosis, colonic polyps, BPPV, BPH who was in good health and lives with his elderly wife, however about 10 days ago while getting out of the bathtub he had a fall hit his head on the sink, at that time EMS came to the house but he refused to come to the hospital for evaluation, subsequently over the last 10 days he has had 2 other falls, has very difficult time getting around the house with a walker as his balance is very poor.     He denies any headache, no fever chills, no exposure to sick contacts, no cough or shortness of breath, no chest pain or palpitations, no diarrhea or dysuria, no nausea vomiting, no focal weakness, however he generally feels very weak all over.   In the ER he was found to be dehydrated, had very poor balance, multiple falls in the setting of Eliquis , living with her elderly wife and unable to care for himself. [facility] was called to admit.     Assessment & Plan:   Principal Problem:   Hyponatremia      Multiple falls, failure to thrive, dehydration and hyponatremia.   - Remains significantly frail, deconditioned, PT, OT consulted, will need SNF placement. 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 - TSH within normal limit  Acute metabolic encephalopathy Hospital delirium -The wife reports that he is with a gradual incline, she has noted significant decline on Sunday, I will obtain MRI brain to rule out acute CVA.  Right brain still pending - Continue with low-dose  Seroquel  at bedtime   History of atrial fibrillation, ablation, pacemaker placement.   - Continue with Eliquis  - Been noncompliant with echo exam, so has been canceled for for now  History of CAD with CABG.  -Chest pain-free, EKG is paced, troponin trend is flat and in non-ACS pattern, no chest pain, obtain echocardiogram, continue Eliquis  and statin and monitor.     Hypertension.   - His ARB, continue with since he is dehydrated will avoid ARB, hydralazine  for now and monitor.   Hypomagnesemia -Replaced   Dyslipidemia.  Continue home dose statin.    Hypokalemia -Replaced   BPH.  Continue Flomax    DVT prophylaxis: Eliquis  Code Status: Full Family Communication: Discussed with wife at bedside 8/21 Disposition:   Status is: Inpatient    Consultants:  none   Subjective:  No significant events overnight as discussed with staff, he denies any complaints today, but he is confused and unreliable. Objective: Vitals:   09/25/23 0447 09/25/23 0739 09/25/23 0800 09/25/23 1217  BP: 111/74 135/73 131/75 111/72  Pulse: 69  70   Resp: 18  18   Temp: 97.9 F (36.6 C) 98.6 F (37 C)  (!) 96.4 F (35.8 C)  TempSrc: Oral Oral  Axillary  SpO2: 95%  95%   Weight:      Height:        Intake/Output Summary (Last 24 hours) at 09/25/2023 1341 Last data filed at 09/25/2023 [phone]  Gross per 24 hour  Intake 100 ml  Output 900 ml  Net -800 ml   Filed Weights   09/22/23 2116  Weight: 78.5 kg    Examination:  He is more somnolent today, remains confused, frail, deconditioned and chronically ill-appearing  Symmetrical Chest wall movement, Good air movement bilaterally, CTAB RRR,No Gallops,Rubs or new Murmurs, No Parasternal Heave +ve B.Sounds, Abd Soft, No tenderness, No rebound - guarding or rigidity. No Cyanosis, Clubbing or edema, No new Rash or bruise       Data Reviewed: I have personally reviewed following labs and imaging studies  CBC: Recent Labs  Lab  09/22/23 1504 09/23/23 0514 09/24/23 0453 09/25/23 0554  WBC 4.5 5.0 4.1 4.2  NEUTROABS 2.2 3.1 1.6* 2.0  HGB 13.8 12.9* 13.7 13.4  HCT 40.6 36.8* 39.5 39.2  MCV 97.4 94.6 94.7 94.2  PLT 165 168 178 177    Basic Metabolic Panel: Recent Labs  Lab 09/22/23 1504 09/23/23 0514 09/24/23 0453 09/25/23 0554  NA 126* 126* 127* 131*  K 4.3 3.8 3.4* 4.0  CL 93* 92* 93* 99  CO2 23 22 24 24   GLUCOSE 90 87 108* 97  BUN 14 12 12 12   CREATININE 0.93 0.91 0.88 0.94  CALCIUM  8.7* 8.1* 8.2* 8.2*  MG  --  1.6* 1.8 1.9  PHOS  --   --  2.9  --     GFR: Estimated Creatinine Clearance: 50.7 mL/min (by C-G formula based on SCr of 0.94 mg/dL).  Liver Function Tests: Recent Labs  Lab 09/22/23 1504 09/23/23 0514 09/24/23 0453 09/25/23 0554  AST 45* 44* 43* 36  ALT 23 24 26 24   ALKPHOS 77 69 71 68  BILITOT 0.9 0.8 0.7 1.0  PROT 6.3* 5.7* 5.7* 5.7*  ALBUMIN  3.2* 2.8* 2.7* 2.7*    CBG: No results for input(s): GLUCAP in the last 168 hours.   No results found for this or any previous visit (from the past 240 hours).       Radiology Studies: No results found.       Scheduled Meds:  apixaban   5 mg Oral BID   atorvastatin   10 mg Oral Daily   feeding supplement  237 mL Oral TID BM   hydrALAZINE   50 mg Oral Q8H   magic mouthwash  5 mL Oral QID   multivitamin with minerals  1 tablet Oral Daily   ofloxacin   1 drop Both Eyes QID   omega-3 acid ethyl esters  1 g Oral Daily   QUEtiapine   25 mg Oral QHS   sodium chloride   1 g Oral BID WC   tamsulosin   0.4 mg Oral QPC supper   Continuous Infusions:     LOS: 3 days       Brayton Lye, MD [facility]   To contact the attending provider between 7A-7P or the covering provider during after hours 7P-7A, please log into the web site [email or web address] and access using universal [place] password for that web site. If you do not have the password, please call the hospital operator.  09/25/2023, 1:41 PM

## 2023-09-25 NOTE — CV Procedure (Signed)
  Device system confirmed to be MRI conditional, with implant date > 6 weeks ago, and no evidence of abandoned or epicardial leads in review of most recent CXR  Device last cleared by EP Provider: Prentice Passey 09/24/23  Clearance is good through for 1 year as long as parameters remain stable at time of check. If pt undergoes a cardiac device procedure during that time, they should be re-cleared.   Tachy-therapies to be programmed off if applicable with device back to pre-MRI settings after completion of exam.  [employer] - Programming recommendation received through [employer] App/Tablet  Rocky Catalan, RT  09/25/2023 7:55 AM

## 2023-09-25 NOTE — Plan of Care (Signed)

## 2023-09-25 NOTE — Care Management Important Message (Signed)
 Important Message  Patient Details  Name: Jonathan Orozco MRN: [id number] Date of Birth: 1928/12/11   Important Message Given:  Yes - Medicare IM     Claretta Deed 09/25/2023, 4:38 PM

## 2023-09-25 NOTE — Progress Notes (Signed)
 Occupational Therapy Treatment Patient Details Name: Jonathan Orozco MRN: [id number] DOB: 12-Aug-1928 Today's Date: 09/25/2023   History of present illness Pt is a 89 y.o. M who presents 09/22/2023 who presents with multiple falls, failure to thrive, dehydration, and hyponatremia. Head CT is unremarkable, MRI brain pending. Significant PMH: atrial fibrillation on Eliquis , history of ablation and pacemaker placement, CAD s/p CABG, HTN, BPPV.   OT comments  Pt awakened for therapy and lethargic until end of session when he began talking, but speech is largely unintelligible. +2 total assist for bed mobility. Demonstrated poor sitting balance with flexed posture. Total assist to eat sherbet and drink from straw. RN reports pt had sedating medication earlier in the day. Patient will benefit from continued inpatient follow up therapy, <3 hours/day.      If plan is discharge home, recommend the following:  Two people to help with walking and/or transfers;A lot of help with bathing/dressing/bathroom;Assistance with cooking/housework;Assistance with feeding;Direct supervision/assist for medications management;Direct supervision/assist for financial management;Assist for transportation;Help with stairs or ramp for entrance   Equipment Recommendations  Other (comment) (defer)    Recommendations for Other Services      Precautions / Restrictions Precautions Precautions: Fall Recall of Precautions/Restrictions: Impaired Restrictions Weight Bearing Restrictions Per Provider Order: No       Mobility Bed Mobility Overal bed mobility: Needs Assistance Bed Mobility: Supine to Sit, Sit to Supine     Supine to sit: Total assist, +2 for physical assistance, +2 for safety/equipment Sit to supine: Total assist, +2 for physical assistance, +2 for safety/equipment        Transfers                   General transfer comment: Deferred 2/2 safety     Balance Overall balance assessment: Needs  assistance, History of Falls Sitting-balance support: Feet supported Sitting balance-Leahy Scale: Poor Sitting balance - Comments: MinA to CGA for anterior and slightly left lateral lean                                   ADL either performed or assessed with clinical judgement   ADL   Eating/Feeding: Total assistance;Sitting   Grooming: Wash/dry face;Sitting;Total assistance               Lower Body Dressing: Total assistance;Bed level                      Extremity/Trunk Assessment              Vision       Perception     Praxis     Communication Communication Communication: Impaired Factors Affecting Communication: Hearing impaired;Difficulty expressing self;Reduced clarity of speech   Cognition Arousal: Lethargic Behavior During Therapy: Flat affect Cognition: Cognition impaired Difficult to assess due to: Hard of hearing/deaf       Attention impairment (select first level of impairment): Focused attention Executive functioning impairment (select all impairments): Initiation, Organization, Sequencing, Reasoning, Problem solving OT - Cognition Comments: no command following, nonsensical speech                 Following commands: Impaired        Cueing      Exercises      Shoulder Instructions       General Comments      Pertinent Vitals/ Pain       Pain Assessment  Pain Assessment: PAINAD Faces Pain Scale: No hurt Breathing: normal Negative Vocalization: none Facial Expression: smiling or inexpressive Body Language: tense, distressed pacing, fidgeting Consolability: no need to console PAINAD Score: 1  Home Living                                          Prior Functioning/Environment              Frequency  Min 2X/week        Progress Toward Goals  OT Goals(current goals can now be found in the care plan section)  Progress towards OT goals: Not progressing toward goals -  comment  Acute Rehab OT Goals OT Goal Formulation: Patient unable to participate in goal setting Time For Goal Achievement: 10/07/23 Potential to Achieve Goals: Fair  Plan      Co-evaluation    PT/OT/SLP Co-Evaluation/Treatment: Yes Reason for Co-Treatment: For patient/therapist safety;Necessary to address cognition/behavior during functional activity PT goals addressed during session: Mobility/safety with mobility;Balance OT goals addressed during session: ADL's and self-care;Strengthening/ROM      AM-PAC OT 6 Clicks Daily Activity     Outcome Measure   Help from another person eating meals?: Total Help from another person taking care of personal grooming?: Total Help from another person toileting, which includes using toliet, bedpan, or urinal?: Total Help from another person bathing (including washing, rinsing, drying)?: Total Help from another person to put on and taking off regular upper body clothing?: Total Help from another person to put on and taking off regular lower body clothing?: Total 6 Click Score: 6    End of Session    OT Visit Diagnosis: Unsteadiness on feet (R26.81);Muscle weakness (generalized) (M62.81);History of falling (Z91.81);Repeated falls (R29.6);Other symptoms and signs involving cognitive function   Activity Tolerance Patient limited by lethargy   Patient Left in bed;with call bell/phone within reach;with bed alarm set   Nurse Communication          Time: 8696-8667 OT Time Calculation (min): 29 min  Charges: OT General Charges $OT Visit: 1 Visit OT Treatments $Self Care/Home Management : 8-22 mins  Mliss HERO, OTR/L Acute Rehabilitation Services Office: [phone]   Kennth Mliss Helling 09/25/2023, 3:16 PM

## 2023-09-26 DIAGNOSIS — E871 Hypo-osmolality and hyponatremia: Secondary | ICD-10-CM | POA: Diagnosis not present

## 2023-09-26 LAB — COMPREHENSIVE METABOLIC PANEL WITH GFR
ALT: 22 U/L (ref 0–44)
AST: 32 U/L (ref 15–41)
Albumin: 2.5 g/dL — ABNORMAL LOW (ref 3.5–5.0)
Alkaline Phosphatase: 63 U/L (ref 38–126)
Anion gap: 2 — ABNORMAL LOW (ref 5–15)
BUN: 15 mg/dL (ref 8–23)
CO2: 26 mmol/L (ref 22–32)
Calcium: 7.9 mg/dL — ABNORMAL LOW (ref 8.9–10.3)
Chloride: 101 mmol/L (ref 98–111)
Creatinine, Ser: 1.05 mg/dL (ref 0.61–1.24)
GFR, Estimated: >60 mL/min (ref >60)
Glucose, Bld: 90 mg/dL (ref 70–99)
Potassium: 3.6 mmol/L (ref 3.5–5.1)
Sodium: 129 mmol/L — ABNORMAL LOW (ref 135–145)
Total Bilirubin: 0.8 mg/dL (ref 0.0–1.2)
Total Protein: 5.3 g/dL — ABNORMAL LOW (ref 6.5–8.1)

## 2023-09-26 LAB — CBC WITH DIFFERENTIAL/PLATELET
Abs Immature Granulocytes: 0 K/uL (ref 0.00–0.07)
Basophils Absolute: 0 K/uL (ref 0.0–0.1)
Basophils Relative: 0 %
Eosinophils Absolute: 0.1 K/uL (ref 0.0–0.5)
Eosinophils Relative: 2 %
HCT: 37.6 % — ABNORMAL LOW (ref 39.0–52.0)
Hemoglobin: 12.8 g/dL — ABNORMAL LOW (ref 13.0–17.0)
Immature Granulocytes: 0 %
Lymphocytes Relative: 49 %
Lymphs Abs: 1.8 K/uL (ref 0.7–4.0)
MCH: 32.2 pg (ref 26.0–34.0)
MCHC: 34 g/dL (ref 30.0–36.0)
MCV: 94.7 fL (ref 80.0–100.0)
Monocytes Absolute: 0.5 K/uL (ref 0.1–1.0)
Monocytes Relative: 13 %
Neutro Abs: 1.3 K/uL — ABNORMAL LOW (ref 1.7–7.7)
Neutrophils Relative %: 36 %
Platelets: 168 K/uL (ref 150–400)
RBC: 3.97 MIL/uL — ABNORMAL LOW (ref 4.22–5.81)
RDW: 12.3 % (ref 11.5–15.5)
WBC: 3.7 K/uL — ABNORMAL LOW (ref 4.0–10.5)
nRBC: 0 % (ref 0.0–0.2)

## 2023-09-26 LAB — MAGNESIUM: Magnesium: 1.9 mg/dL (ref 1.7–2.4)

## 2023-09-26 MED ORDER — POLYETHYLENE GLYCOL 3350 17 G PO PACK: 17.0000 g | PACK | Freq: Two times a day (BID) | ORAL | Status: DC | PRN

## 2023-09-26 MED ORDER — ORAL CARE MOUTH RINSE
15.0000 mL | OROMUCOSAL | Status: DC
  Administered 2023-09-26 – 2023-09-28 (×6): 15 mL via OROMUCOSAL

## 2023-09-26 MED ORDER — MENTHOL 3 MG MT LOZG
1.0000 | LOZENGE | Freq: Two times a day (BID) | OROMUCOSAL | Status: AC
  Administered 2023-09-26 – 2023-09-27 (×4): 3 mg via ORAL
  Filled 2023-09-26: qty 9

## 2023-09-26 MED ORDER — ORAL CARE MOUTH RINSE: 15.0000 mL | OROMUCOSAL | Status: DC | PRN

## 2023-09-26 MED ORDER — PHENOL 1.4 % MT LIQD
1.0000 | Freq: Two times a day (BID) | OROMUCOSAL | Status: DC
  Administered 2023-09-26 (×2): 1 via OROMUCOSAL
  Filled 2023-09-26: qty 177

## 2023-09-26 NOTE — Progress Notes (Signed)
 PROGRESS NOTE    Jonathan Orozco  FMW:[id number] DOB: 01/11/1929 DOA: 09/22/2023 PCP: Norleen Lynwood ORN, MD   Chief Complaint  Patient presents with   Fall    Brief Narrative:   Jonathan Orozco  is a 89 y.o. male, with history of atrial fibrillation on Eliquis , history of ablation and pacemaker placement, CAD s/p CABG, hypertension, dyslipidemia, diverticulosis, colonic polyps, BPPV, BPH who was in good health and lives with his elderly wife, however about 10 days ago while getting out of the bathtub he had a fall hit his head on the sink, at that time EMS came to the house but he refused to come to the hospital for evaluation, subsequently over the last 10 days he has had 2 other falls, has very difficult time getting around the house with a walker as his balance is very poor.     He denies any headache, no fever chills, no exposure to sick contacts, no cough or shortness of breath, no chest pain or palpitations, no diarrhea or dysuria, no nausea vomiting, no focal weakness, however he generally feels very weak all over.   In the ER he was found to be dehydrated, had very poor balance, multiple falls in the setting of Eliquis , living with her elderly wife and unable to care for himself. [facility] was called to admit.     Assessment & Plan:   Principal Problem:   Hyponatremia      Multiple falls, failure to thrive, dehydration and hyponatremia.   - Remains significantly frail, deconditioned, PT, OT consulted, will need SNF placement. 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 - TSH within normal limit  Acute metabolic encephalopathy Hospital delirium - Wife describe it as sudden decline in his functional/mental capacity, but upon further reviewing it does appear he has gradual decline  - MRI brain without acute ischemic event, significant  for generalized for limb loss due to vascular dementia - Improved low-dose Seroquel  at bedtime   History of atrial fibrillation, ablation, pacemaker placement.   - Continue with Eliquis  -  noncompliant with echo exam, so has been canceled for for now  History of CAD with CABG.  -Chest pain-free, EKG is paced, troponin trend is flat and in non-ACS pattern, no chest pain, obtain echocardiogram, continue Eliquis  and statin and monitor.     Hypertension.   - His ARB, continue with since he is dehydrated will avoid ARB, hydralazine  for now and monitor.   Hypomagnesemia -Replaced   Dyslipidemia.  Continue home dose statin.    Hypokalemia -Replaced   BPH.  Continue Flomax    DVT prophylaxis: Eliquis  Code Status: Full Family Communication: Discussed with wife at bedside 8/21 Disposition:   Status is: Inpatient    Consultants:  none   Subjective:  No significant events as discussed with staff, patient reports sore throat. Objective: Vitals:   09/26/23 0800 09/26/23 1157 09/26/23 1158 09/26/23 1200  BP: (!) 121/93 131/72  120/78  Pulse: 71 70    Resp: 19 19  19   Temp: 98 F (36.7 C) 98.4 F (36.9 C) 98.4 F (36.9 C) 98.4 F (36.9 C)  TempSrc: Oral Oral  Oral  SpO2: 93%     Weight:      Height:        Intake/Output Summary (Last 24 hours) at 09/26/2023 1331 Last data filed  at 09/26/2023 0600 Gross per 24 hour  Intake 360 ml  Output 1300 ml  Net -940 ml   Filed Weights   09/22/23 2116  Weight: 78.5 kg    Examination:  Awake Alert, teamly frail, deconditioned, pleasant, mildly confused  No pharyngeal erythema or tonsillar swelling noted CTAB RRR,No Gallops,Rubs or new Murmurs, No Parasternal Heave +ve B.Sounds, Abd Soft, No tenderness, No rebound - guarding or rigidity. No Cyanosis, Clubbing or edema, No new Rash or bruise       Data Reviewed: I have personally reviewed following labs and imaging studies  CBC: Recent Labs  Lab 09/22/23 1504  09/23/23 0514 09/24/23 0453 09/25/23 0554 09/26/23 0530  WBC 4.5 5.0 4.1 4.2 3.7*  NEUTROABS 2.2 3.1 1.6* 2.0 1.3*  HGB 13.8 12.9* 13.7 13.4 12.8*  HCT 40.6 36.8* 39.5 39.2 37.6*  MCV 97.4 94.6 94.7 94.2 94.7  PLT 165 168 178 177 168    Basic Metabolic Panel: Recent Labs  Lab 09/22/23 1504 09/23/23 0514 09/24/23 0453 09/25/23 0554 09/26/23 0530  NA 126* 126* 127* 131* 129*  K 4.3 3.8 3.4* 4.0 3.6  CL 93* 92* 93* 99 101  CO2 23 22 24 24 26   GLUCOSE 90 87 108* 97 90  BUN 14 12 12 12 15   CREATININE 0.93 0.91 0.88 0.94 1.05  CALCIUM  8.7* 8.1* 8.2* 8.2* 7.9*  MG  --  1.6* 1.8 1.9 1.9  PHOS  --   --  2.9  --   --     GFR: Estimated Creatinine Clearance: 45.4 mL/min (by C-G formula based on SCr of 1.05 mg/dL).  Liver Function Tests: Recent Labs  Lab 09/22/23 1504 09/23/23 0514 09/24/23 0453 09/25/23 0554 09/26/23 0530  AST 45* 44* 43* 36 32  ALT 23 24 26 24 22   ALKPHOS 77 69 71 68 63  BILITOT 0.9 0.8 0.7 1.0 0.8  PROT 6.3* 5.7* 5.7* 5.7* 5.3*  ALBUMIN  3.2* 2.8* 2.7* 2.7* 2.5*    CBG: No results for input(s): GLUCAP in the last 168 hours.   No results found for this or any previous visit (from the past 240 hours).       Radiology Studies: MR BRAIN WO CONTRAST Result Date: 09/25/2023 CLINICAL DATA:  Altered mental status beginning about 10 days ago after a fall with trauma to the head. EXAM: MRI HEAD WITHOUT CONTRAST TECHNIQUE: Multiplanar, multiecho pulse sequences of the brain and surrounding structures were obtained without intravenous contrast. COMPARISON:  Head CT 3 days ago. FINDINGS: Brain: Diffusion imaging does not show any acute or subacute infarction or other cause of restricted diffusion. No focal abnormality affects the cerebellum. There is a focus of susceptibility artifact in the right side of the pons which could be due to an old small vessel stroke or a tiny cavernoma. Cerebral hemispheres show generalized age related volume loss without  subjective lobar predominance. There are advanced chronic small-vessel ischemic changes affecting the white matter. No cortical or large vessel territory stroke. No mass lesion, acute hemorrhage, structure hydrocephalus or extra-axial collection. Vascular: Major vessels at the base of the brain show flow. Skull and upper cervical spine: Negative Sinuses/Orbits: Clear/normal Other: None IMPRESSION: 1. No acute or reversible finding. Generalized age related volume loss without subjective lobar predominance. Advanced chronic small-vessel ischemic changes of the cerebral hemispheric white matter. 2. Focus of susceptibility artifact in the right side of the pons which could be due to an old small vessel stroke or a tiny cavernoma. Electronically Signed  By: Oneil Officer M.D.   On: 09/25/2023 13:49         Scheduled Meds:  apixaban   5 mg Oral BID   atorvastatin   10 mg Oral Daily   feeding supplement  237 mL Oral TID BM   hydrALAZINE   50 mg Oral Q8H   magic mouthwash  5 mL Oral QID   menthol -cetylpyridinium  1 lozenge Oral BID   multivitamin with minerals  1 tablet Oral Daily   ofloxacin   1 drop Both Eyes QID   omega-3 acid ethyl esters  1 g Oral Daily   mouth rinse  15 mL Mouth Rinse 4 times per day   phenol  1 spray Mouth/Throat BID   QUEtiapine   25 mg Oral QHS   sodium chloride   1 g Oral BID WC   tamsulosin   0.4 mg Oral QPC supper   Continuous Infusions:     LOS: 4 days       Brayton Lye, MD [facility]   To contact the attending provider between 7A-7P or the covering provider during after hours 7P-7A, please log into the web site [email or web address] and access using universal [place] password for that web site. If you do not have the password, please call the hospital operator.  09/26/2023, 1:31 PM

## 2023-09-26 NOTE — Progress Notes (Signed)
 Initial Nutrition Assessment  DOCUMENTATION CODES:   Not applicable  INTERVENTION:   Ensure Plus High Protein po TID, each supplement provides 350 kcal and 20 grams of protein  Magic cup TID with meals, each supplement provides 290 kcal and 9 grams of protein  MVI po daily   Pt at refeed risk; recommend monitor potassium, magnesium  and phosphorus labs daily until stable  Daily weights   NUTRITION DIAGNOSIS:   Inadequate oral intake related to acute illness as evidenced by meal completion < 50%.  GOAL:   Patient will meet greater than or equal to 90% of their needs  MONITOR:   PO intake, Supplement acceptance, Labs, Weight trends, Skin, I & O's  REASON FOR ASSESSMENT:   Consult Assessment of nutrition requirement/status  ASSESSMENT:   89 y/o male with h/o HLD, BPH, anxiety, depression, CAD s/p CABG, Afib, IBS, GERD, HTN, CHF, BPPV, etoh abuse (remote), chronic pain and frequent falls who is admitted with hyponatremia, weakness and falls.  RD working remotely.  Pt with decreased appetite and oral intake pta. Pt eating 50% of meals in hospital. Recommend continue Ensure and MVI. RD will add Magic Cups to meal trays. Pt is at refeed risk. No BM since 8/18; MD notified. Per chart, pt appears weight stable at baseline. RD will obtain history and exam at follow up. Pt is at high risk for malnutrition.   Medications reviewed and include: MVI, NaCl tabs  Labs reviewed: Na 129(L), K 3.6 wnl, Mg 1.9 wnl Wbc- 3.7(L)  NUTRITION - FOCUSED PHYSICAL EXAM: RD unable to perform at this time   Diet Order:   Diet Order             Diet regular Room service appropriate? Yes; Fluid consistency: Thin  Diet effective now                  EDUCATION NEEDS:   No education needs have been identified at this time  Skin:  Skin Assessment: Reviewed RN Assessment (ecchymosis)  Last BM:  8/18  Height:   Ht Readings from Last 1 Encounters:  09/22/23 5' 10 (1.778 m)     Weight:   Wt Readings from Last 1 Encounters:  09/22/23 78.5 kg    Ideal Body Weight:  75.45 kg  BMI:  Body mass index is 24.83 kg/m.  Estimated Nutritional Needs:   Kcal:  1900-2200kcal/day  Protein:  95-110g/day  Fluid:  1.9-2.2L/day  Augustin Shams MS, RD, LDN If unable to be reached, please send secure chat to RD inpatient available from 8:00a-4:00p daily

## 2023-09-26 NOTE — Plan of Care (Signed)

## 2023-09-27 DIAGNOSIS — E871 Hypo-osmolality and hyponatremia: Secondary | ICD-10-CM | POA: Diagnosis not present

## 2023-09-27 LAB — CBC WITH DIFFERENTIAL/PLATELET
Abs Immature Granulocytes: 0.01 K/uL (ref 0.00–0.07)
Basophils Absolute: 0 K/uL (ref 0.0–0.1)
Basophils Relative: 0 %
Eosinophils Absolute: 0.3 K/uL (ref 0.0–0.5)
Eosinophils Relative: 6 %
HCT: 39.8 % (ref 39.0–52.0)
Hemoglobin: 13.7 g/dL (ref 13.0–17.0)
Immature Granulocytes: 0 %
Lymphocytes Relative: 41 %
Lymphs Abs: 2 K/uL (ref 0.7–4.0)
MCH: 32.5 pg (ref 26.0–34.0)
MCHC: 34.4 g/dL (ref 30.0–36.0)
MCV: 94.5 fL (ref 80.0–100.0)
Monocytes Absolute: 0.4 K/uL (ref 0.1–1.0)
Monocytes Relative: 9 %
Neutro Abs: 2.1 K/uL (ref 1.7–7.7)
Neutrophils Relative %: 44 %
Platelets: 164 K/uL (ref 150–400)
RBC: 4.21 MIL/uL — ABNORMAL LOW (ref 4.22–5.81)
RDW: 12.1 % (ref 11.5–15.5)
WBC: 4.8 K/uL (ref 4.0–10.5)
nRBC: 0 % (ref 0.0–0.2)

## 2023-09-27 LAB — COMPREHENSIVE METABOLIC PANEL WITH GFR
ALT: 18 U/L (ref 0–44)
AST: 33 U/L (ref 15–41)
Albumin: 2.5 g/dL — ABNORMAL LOW (ref 3.5–5.0)
Alkaline Phosphatase: 64 U/L (ref 38–126)
Anion gap: 7 (ref 5–15)
BUN: 15 mg/dL (ref 8–23)
CO2: 23 mmol/L (ref 22–32)
Calcium: 8.2 mg/dL — ABNORMAL LOW (ref 8.9–10.3)
Chloride: 103 mmol/L (ref 98–111)
Creatinine, Ser: 0.82 mg/dL (ref 0.61–1.24)
GFR, Estimated: >60 mL/min (ref >60)
Glucose, Bld: 84 mg/dL (ref 70–99)
Potassium: 3.9 mmol/L (ref 3.5–5.1)
Sodium: 133 mmol/L — ABNORMAL LOW (ref 135–145)
Total Bilirubin: 1 mg/dL (ref 0.0–1.2)
Total Protein: 5.4 g/dL — ABNORMAL LOW (ref 6.5–8.1)

## 2023-09-27 LAB — MAGNESIUM: Magnesium: 1.9 mg/dL (ref 1.7–2.4)

## 2023-09-27 LAB — PHOSPHORUS: Phosphorus: 2.9 mg/dL (ref 2.5–4.6)

## 2023-09-27 MED ORDER — TRAZODONE HCL 50 MG PO TABS: 25.0000 mg | ORAL_TABLET | Freq: Every evening | ORAL | Status: DC | PRN

## 2023-09-27 MED ORDER — PANTOPRAZOLE SODIUM 40 MG PO TBEC
40.0000 mg | DELAYED_RELEASE_TABLET | Freq: Every day | ORAL | Status: DC
  Administered 2023-09-27 – 2023-09-28 (×2): 40 mg via ORAL
  Filled 2023-09-27 (×2): qty 1

## 2023-09-27 NOTE — Plan of Care (Signed)

## 2023-09-27 NOTE — Progress Notes (Signed)
 PROGRESS NOTE    Jonathan Orozco  FMW:[id number] DOB: Dec 16, 1928 DOA: 09/22/2023 PCP: Norleen Lynwood ORN, MD   Chief Complaint  Patient presents with   Fall    Brief Narrative:   Jonathan Orozco  is a 89 y.o. male, with history of atrial fibrillation on Eliquis , history of ablation and pacemaker placement, CAD s/p CABG, hypertension, dyslipidemia, diverticulosis, colonic polyps, BPPV, BPH who was in good health and lives with his elderly wife, however about 10 days ago while getting out of the bathtub he had a fall hit his head on the sink, at that time EMS came to the house but he refused to come to the hospital for evaluation, subsequently over the last 10 days he has had 2 other falls, has very difficult time getting around the house with a walker as his balance is very poor.     He denies any headache, no fever chills, no exposure to sick contacts, no cough or shortness of breath, no chest pain or palpitations, no diarrhea or dysuria, no nausea vomiting, no focal weakness, however he generally feels very weak all over.   In the ER he was found to be dehydrated, had very poor balance, multiple falls in the setting of Eliquis , living with her elderly wife and unable to care for himself. [facility] was called to admit.     Assessment & Plan:   Principal Problem:   Hyponatremia      Multiple falls, failure to thrive, dehydration and hyponatremia.   - Remains significantly frail, deconditioned, PT, OT consulted, will need SNF placement. 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 - TSH within normal limit  Acute metabolic encephalopathy Hospital delirium - Wife describe it as sudden decline in his functional/mental capacity, but upon further reviewing it does appear he has gradual decline  - MRI brain without acute ischemic event, significant  for generalized for limb loss due to vascular dementia - Improved low-dose Seroquel  at bedtime - He has been somnolent earlier today, I will decrease his as needed trazodone     History of atrial fibrillation, ablation, pacemaker placement.   - Continue with Eliquis  -  noncompliant with echo exam, so has been canceled for for now  History of CAD with CABG.  -Chest pain-free, EKG is paced, troponin trend is flat and in non-ACS pattern, no chest pain, obtain echocardiogram, continue Eliquis  and statin and monitor.     Hypertension.   - Blood pressure has been acceptable, so we will continue to hold his losartan  especially he presents with dehydration .   Hypomagnesemia -Replaced   Dyslipidemia.  Continue home dose statin.    Hypokalemia -Replaced   BPH.  Continue Flomax    DVT prophylaxis: Eliquis  Code Status: Full Family Communication: Discussed with wife at bedside 8/21, 8/23 Disposition:   Status is: Inpatient    Consultants:  none   Subjective:  No significant events overnight as discussed with staff, has been somnolent this morning, Objective: Vitals:   09/26/23 2303 09/27/23 0340 09/27/23 0824 09/27/23 1209  BP: 102/67 131/76 (!) 91/52 (!) 140/75  Pulse: 78 75 69 70  Resp: 16 14 16 19   Temp: 98 F (36.7 C) 98 F (36.7 C) 97.6 F (36.4 C) 98 F (36.7 C)  TempSrc: Oral Oral Axillary Oral  SpO2:      Weight:  Height:        Intake/Output Summary (Last 24 hours) at 09/27/2023 1442 Last data filed at 09/27/2023 0000 Gross per 24 hour  Intake 0 ml  Output 600 ml  Net -600 ml   Filed Weights   09/22/23 2116  Weight: 78.5 kg    Examination:  Somnolent this morning, but he is more awake appropriate, pleasant in the early afternoon Symmetrical Chest wall movement, Good air movement bilaterally, CTAB RRR,No Gallops,Rubs or new Murmurs, No Parasternal Heave +ve B.Sounds, Abd Soft, No tenderness, No rebound - guarding or rigidity. No Cyanosis,  Clubbing or edema, No new Rash or bruise        Data Reviewed: I have personally reviewed following labs and imaging studies  CBC: Recent Labs  Lab 09/23/23 0514 09/24/23 0453 09/25/23 0554 09/26/23 0530 09/27/23 0618  WBC 5.0 4.1 4.2 3.7* 4.8  NEUTROABS 3.1 1.6* 2.0 1.3* 2.1  HGB 12.9* 13.7 13.4 12.8* 13.7  HCT 36.8* 39.5 39.2 37.6* 39.8  MCV 94.6 94.7 94.2 94.7 94.5  PLT 168 178 177 168 164    Basic Metabolic Panel: Recent Labs  Lab 09/23/23 0514 09/24/23 0453 09/25/23 0554 09/26/23 0530 09/27/23 0618  NA 126* 127* 131* 129* 133*  K 3.8 3.4* 4.0 3.6 3.9  CL 92* 93* 99 101 103  CO2 22 24 24 26 23   GLUCOSE 87 108* 97 90 84  BUN 12 12 12 15 15   CREATININE 0.91 0.88 0.94 1.05 0.82  CALCIUM  8.1* 8.2* 8.2* 7.9* 8.2*  MG 1.6* 1.8 1.9 1.9 1.9  PHOS  --  2.9  --   --  2.9    GFR: Estimated Creatinine Clearance: 58.1 mL/min (by C-G formula based on SCr of 0.82 mg/dL).  Liver Function Tests: Recent Labs  Lab 09/23/23 0514 09/24/23 0453 09/25/23 0554 09/26/23 0530 09/27/23 0618  AST 44* 43* 36 32 33  ALT 24 26 24 22 18   ALKPHOS 69 71 68 63 64  BILITOT 0.8 0.7 1.0 0.8 1.0  PROT 5.7* 5.7* 5.7* 5.3* 5.4*  ALBUMIN  2.8* 2.7* 2.7* 2.5* 2.5*    CBG: No results for input(s): GLUCAP in the last 168 hours.   No results found for this or any previous visit (from the past 240 hours).       Radiology Studies: No results found.        Scheduled Meds:  apixaban   5 mg Oral BID   atorvastatin   10 mg Oral Daily   feeding supplement  237 mL Oral TID BM   hydrALAZINE   50 mg Oral Q8H   menthol -cetylpyridinium  1 lozenge Oral BID   multivitamin with minerals  1 tablet Oral Daily   ofloxacin   1 drop Both Eyes QID   omega-3 acid ethyl esters  1 g Oral Daily   mouth rinse  15 mL Mouth Rinse 4 times per day   pantoprazole   40 mg Oral Daily   phenol  1 spray Mouth/Throat BID   QUEtiapine   25 mg Oral QHS   sodium chloride   1 g Oral BID WC   tamsulosin   0.4  mg Oral QPC supper   Continuous Infusions:     LOS: 5 days       Brayton Lye, MD [facility]   To contact the attending provider between 7A-7P or the covering provider during after hours 7P-7A, please log into the web site [email or web address] and access using universal [place] password for that web site. If you do not have the  password, please call the hospital operator.  09/27/2023, 2:42 PM

## 2023-09-28 DIAGNOSIS — E871 Hypo-osmolality and hyponatremia: Secondary | ICD-10-CM | POA: Diagnosis not present

## 2023-09-28 MED ORDER — ENSURE PLUS HIGH PROTEIN PO LIQD
237.0000 mL | Freq: Three times a day (TID) | ORAL | Status: AC
Start: 2023-09-28 — End: ?

## 2023-09-28 MED ORDER — TRAZODONE HCL 50 MG PO TABS: 25.0000 mg | ORAL_TABLET | Freq: Every evening | ORAL | Status: DC | PRN

## 2023-09-28 MED ORDER — POLYETHYLENE GLYCOL 3350 17 G PO PACK: 17.0000 g | PACK | Freq: Two times a day (BID) | ORAL | Status: DC | PRN

## 2023-09-28 MED ORDER — HYDRALAZINE HCL 50 MG PO TABS: 50.0000 mg | ORAL_TABLET | Freq: Three times a day (TID) | ORAL | Status: DC

## 2023-09-28 MED ORDER — OFLOXACIN 0.3 % OP SOLN: 1.0000 [drp] | Freq: Four times a day (QID) | OPHTHALMIC | Status: AC

## 2023-09-28 MED ORDER — QUETIAPINE FUMARATE 25 MG PO TABS: 25.0000 mg | ORAL_TABLET | Freq: Every day | ORAL | Status: DC

## 2023-09-28 MED ORDER — ACETAMINOPHEN 325 MG PO TABS: 650.0000 mg | ORAL_TABLET | Freq: Four times a day (QID) | ORAL | Status: AC | PRN

## 2023-09-28 NOTE — Progress Notes (Signed)
 DC order noted per MD. DC RN at bedside. Patient is schedule for transfer to [facility] via [facility]. Transfer packet complete with AVS, medical necessity, and DNR.

## 2023-09-28 NOTE — Plan of Care (Signed)

## 2023-09-28 NOTE — Evaluation (Signed)
 Speech Language Pathology Evaluation Patient Details Name: Jonathan Orozco MRN: [id number] DOB: November 07, 1928 Today's Date: 09/28/2023 Time: 8881-8870 SLP Time Calculation (min) (ACUTE ONLY): 11 min  Problem List:  Patient Active Problem List   Diagnosis Date Noted   Recurrent falls 09/22/2023   Hyponatremia 09/22/2023   Generalized weakness 12/20/2022   Depression 08/16/2022   Generalized arthritis 03/11/2022   PSA elevation 03/04/2022   Symptomatic bradycardia 02/05/2022   Atrial fibrillation (HCC) 02/05/2022   (HFpEF) heart failure with preserved ejection fraction (HCC) 02/05/2022   Pacemaker 02/05/2022   Elbow wound 12/17/2021   Benign prostatic hyperplasia with urinary frequency 08/31/2021   Encounter for competency evaluation 10/27/2020   Encounter for chronic pain management 01/14/2020   Hypertension, uncontrolled 01/11/2020   GERD (gastroesophageal reflux disease) 11/12/2019   Heme positive stool 11/10/2019   Anticoagulated by anticoagulation treatment 05/31/2019   Epistaxis 05/31/2019   Acute gouty arthritis 04/29/2019   Left arm swelling 01/05/2018   IBS (irritable bowel syndrome) 06/30/2017   Bilateral impacted cerumen 11/11/2016   Imbalance 11/11/2016   Encounter for well adult exam with abnormal findings 06/19/2016   Elevated PSA 06/19/2016   Gait disorder 01/27/2016   Chronic anticoagulation 12/18/2015   Hyperglycemia 06/17/2015   CAD (coronary artery disease), native coronary artery 03/12/2015   Atrial fibrillation with rapid ventricular response (HCC)    Solitary pulmonary nodule 05/24/2013   Anxiety state 05/21/2012   Chronic low back pain 05/21/2012   Long term (current) use of anticoagulants 04/24/2012   Hearing loss of both ears 11/11/2010   Fatigue 11/16/2009   VERTIGO 04/08/2007   MELANOMA, MALIGNANT, SKIN NOS 09/29/2006   Hyperlipidemia 09/29/2006   ERECTILE DYSFUNCTION 09/29/2006   VENTRICULAR TACHYCARDIA 09/29/2006   BENIGN PROSTATIC HYPERTROPHY  09/29/2006   OSTEOARTHROSIS NOS, OTHER SPEC SITE 09/29/2006   Past Medical History:  Past Medical History:  Diagnosis Date   ABUSE, ALCOHOL, IN REMISSION 04/08/2007   ALLERGIC RHINITIS 09/29/2006   Arthritis    hands (07/02/2016)   Atrial fibrillation (HCC)    BENIGN PROSTATIC HYPERTROPHY 09/29/2006   BPPV (benign paroxysmal positional vertigo) 04/08/2007   CAD (coronary artery disease)    a. LHC 2/17: EF 55-65%, LM 75, pLAD 75, oD1 75, oD2 65, D3 85, oLCx 99, oOM1 75, pRCA 25 >> CABG   Carotid stenosis    a. Carotid US  2/17:  Bilateral ICA 1-39% ICA   Chronic lower back pain    COLONIC POLYPS, HX OF 06/23/2007   DISORDERS, ORGANIC INSOMNIA NOS 09/29/2006   Diverticulosis    ERECTILE DYSFUNCTION 09/29/2006   GERD (gastroesophageal reflux disease)    GLUCOSE INTOLERANCE 03/19/2010   HYPERLIPIDEMIA 09/29/2006   HYPERTENSION 09/29/2006   Long term (current) use of anticoagulants 04/26/2010   MELANOMA, MALIGNANT, SKIN NOS 09/29/2006   other skin cancers -no further melanoma   OSTEOARTHROSIS NOS, OTHER [facility] SITE 09/29/2006   Other specified forms of hearing loss 08/10/2009   Pneumonia ~ 1969   Presence of permanent cardiac pacemaker 06/02/2016   VENTRICULAR TACHYCARDIA 09/29/2006   Past Surgical History:  Past Surgical History:  Procedure Laterality Date   AV NODE ABLATION N/A 07/02/2016   Procedure: AV Node Ablation;  Surgeon: Fernande Elspeth JAYSON, MD;  Location: [facility] INVASIVE CV LAB;  Service: Cardiovascular;  Laterality: N/A;   CARDIAC CATHETERIZATION N/A 03/12/2015   Procedure: Left Heart Cath and Coronary Angiography;  Surgeon: Victory LELON Sharps, MD;  Location: [facility] INVASIVE CV LAB;  Service: Cardiovascular;  Laterality: N/A;   CARDIOVERSION N/A  12/19/2015   Procedure: CARDIOVERSION;  Surgeon: Oneil JAYSON Parchment, MD;  Location: [facility] ENDOSCOPY;  Service: Cardiovascular;  Laterality: N/A;   CARDIOVERSION N/A 03/24/2016   Procedure: CARDIOVERSION;  Surgeon: Leim VEAR Moose, MD;  Location: [facility]  ENDOSCOPY;  Service: Cardiovascular;  Laterality: N/A;   CATARACT EXTRACTION W/ INTRAOCULAR LENS  IMPLANT, BILATERAL Bilateral    CLIPPING OF ATRIAL APPENDAGE N/A 03/14/2015   Procedure: CLIPPING OF ATRIAL APPENDAGE;  Surgeon: Elspeth JAYSON Millers, MD;  Location: [facility] OR;  Service: Open Heart Surgery;  Laterality: N/A;   COLONOSCOPY WITH PROPOFOL  N/A 12/08/2014   Procedure: COLONOSCOPY WITH PROPOFOL ;  Surgeon: Belvie Just, MD;  Location: [facility] ENDOSCOPY;  Service: Endoscopy;  Laterality: N/A;   CORONARY ARTERY BYPASS GRAFT N/A 03/14/2015   Procedure: CORONARY ARTERY BYPASS GRAFTING (CABG) x 4 (LIMA to LAD, SVG to DIAGONAL 2, SVG SEQUENTIALLY to OM1 and OM2);  Surgeon: Elspeth JAYSON Millers, MD;  Location: [facility] OR;  Service: Open Heart Surgery;  Laterality: N/A;   INCISION AND DRAINAGE N/A 07/03/2016   Procedure: INCISION AND DRAINAGE of CHEST ABSCESS;  Surgeon: Millers Elspeth JAYSON, MD;  Location: [facility] OR;  Service: Thoracic;  Laterality: N/A;   JOINT REPLACEMENT     KNEE ARTHROSCOPY Left    MELANOMA EXCISION     upper back   PACEMAKER IMPLANT N/A 06/02/2016   Procedure: Pacemaker Implant;  Surgeon: Elspeth JAYSON Sage, MD;  Location: [facility] INVASIVE CV LAB;  Service: Cardiovascular;  Laterality: N/A;   SHOULDER OPEN ROTATOR CUFF REPAIR Left    SKIN CANCER EXCISION Right    cheek; real deep   TEE WITHOUT CARDIOVERSION N/A 03/14/2015   Procedure: TRANSESOPHAGEAL ECHOCARDIOGRAM (TEE);  Surgeon: Elspeth JAYSON Millers, MD;  Location: [facility] OR;  Service: Open Heart Surgery;  Laterality: N/A;   TEE WITHOUT CARDIOVERSION N/A 12/19/2015   Procedure: TRANSESOPHAGEAL ECHOCARDIOGRAM (TEE);  Surgeon: Oneil JAYSON Parchment, MD;  Location: [facility] ENDOSCOPY;  Service: Cardiovascular;  Laterality: N/A;   TEE WITHOUT CARDIOVERSION N/A 03/24/2016   Procedure: TRANSESOPHAGEAL ECHOCARDIOGRAM (TEE);  Surgeon: Leim VEAR Moose, MD;  Location: [facility] ENDOSCOPY;  Service: Cardiovascular;  Laterality: N/A;   TONSILLECTOMY     TOTAL KNEE  ARTHROPLASTY Right    HPI:  Pt is a 89 y.o. M who presents 09/22/2023 who presents with multiple falls, failure to thrive, dehydration, and hyponatremia. Head CT is unremarkable, MRI brain pending. Significant PMH: atrial fibrillation on Eliquis , history of ablation and pacemaker placement, CAD s/p CABG, HTN, BPPV.   Assessment / Plan / Recommendation Clinical Impression  Pt seen without family at bedside. Prior SLP spoke with wife who reported she is responsible for pt's medications and bill paying and stated cognitive skill have declined suddently however MD reports this decline has likely been more gradual. His speech appeeared to approve since PT's initial eval where they reported it was nonsensical. Today pt exhibits cognitive impairments but was able to express his thoughts is sentences that were intelligible. He stated wife's name was oriented to city but not place but looked at board for room number. Difficulty storing information as he was told he was in the hospital but unable to recall at end of session independently (recalled with choice cue demonstrating retrieval impairments). He followed one step commands and independently identified call bell when needing assistance. Pt appears to have chronic cognitive impairments that may have improved somewhat since initial admission. Recommend he follow up with SLP at next venue and is scheduled to be discharged today to SNF.    SLP  Assessment  SLP Recommendation/Assessment: All further Speech Language Pathology needs can be addressed in the next venue of care SLP Visit Diagnosis: Cognitive communication deficit (R41.841)     Assistance Recommended at Discharge  Frequent or constant Supervision/Assistance  Functional Status Assessment Patient has not had a recent decline in their functional status  Frequency and Duration           SLP Evaluation Cognition  Overall Cognitive Status: No family/caregiver present to determine baseline  cognitive functioning (suspect history- uncertain if at baseline but may be improved per PT initial assessment.) Arousal/Alertness: Awake/alert Orientation Level: Oriented to person;Disoriented to place;Disoriented to time;Disoriented to situation Attention: Sustained Sustained Attention: Appears intact Memory:  (could not recall hospital stated 5 min earlier) Awareness: Impaired Awareness Impairment: Intellectual impairment;Emergent impairment;Anticipatory impairment Problem Solving: Impaired Safety/Judgment: Impaired       Comprehension  Auditory Comprehension Overall Auditory Comprehension: Appears within functional limits for tasks assessed Visual Recognition/Discrimination Discrimination: Not tested Reading Comprehension Reading Status: Not tested    Expression Expression Primary Mode of Expression: Verbal Verbal Expression Overall Verbal Expression: Appears within functional limits for tasks assessed Initiation: No impairment Level of Generative/Spontaneous Verbalization: Sentence Repetition:  (NT) Naming: Not tested Pragmatics: No impairment Written Expression Written Expression: Not tested   Oral / Motor  Oral Motor/Sensory Function Overall Oral Motor/Sensory Function: Within functional limits Motor Speech Overall Motor Speech: Appears within functional limits for tasks assessed Respiration: Within functional limits Phonation: Normal Resonance: Within functional limits Articulation: Within functional limitis Intelligibility: Intelligible Motor Planning: Within functional limits            Dustin Olam Bull 09/28/2023, 11:57 AM

## 2023-09-28 NOTE — TOC Progression Note (Signed)
 Transition of Care [facility]) - Progression Note    Patient Details  Name: Jonathan Orozco MRN: [id number] Date of Birth: Jan 28, 1929  [facility] of [facility]) CM/SW Contact  Inocente GORMAN Kindle, LCSW Phone Number: 09/28/2023, 11:55 AM  Clinical Narrative:    [facility] able to accept patient today. CSW updated spouse who requested PTAR for transport. She will arrive at the hospital around 1pm and would like to be present for transport.    Expected Discharge Plan: Skilled Nursing Facility Barriers to Discharge: Barriers Resolved               Expected Discharge Plan and Services In-house Referral: Clinical Social Work   Post Acute Care Choice: Skilled Nursing Facility Living arrangements for the past 2 months: Single Family Home Expected Discharge Date: 09/28/23                                     Social Drivers of Health (SDOH) Interventions SDOH Screenings   Food Insecurity: No Food Insecurity (09/22/2023)  Housing: Low Risk  (09/22/2023)  Transportation Needs: No Transportation Needs (09/22/2023)  Utilities: Not At Risk (09/22/2023)  Alcohol Screen: Low Risk  (04/13/2023)  Depression (PHQ2-9): Low Risk  (09/22/2023)  Financial Resource Strain: Low Risk  (04/13/2023)  Physical Activity: Insufficiently Active (04/13/2023)  Social Connections: Moderately Integrated (09/22/2023)  Stress: No Stress Concern Present (04/13/2023)  Tobacco Use: Medium Risk (09/22/2023)  Health Literacy: Adequate Health Literacy (04/13/2023)    Readmission Risk Interventions     No data to display

## 2023-09-28 NOTE — Progress Notes (Signed)
 Report given to [facility] at [facility] at [phone]. All questions asked and answered. IV removed. Pt belongings sent with wife. Pt in no acute distress. PTAR at bedside.

## 2023-09-28 NOTE — TOC Transition Note (Signed)
 Transition of Care [facility]) - Discharge Note   Patient Details  Name: Jonathan Orozco MRN: [id number] Date of Birth: Mar 18, 1928  [facility]) CM/SW Contact:  Inocente GORMAN Kindle, LCSW Phone Number: 09/28/2023, 1:19 PM   Clinical Narrative:    Patient will DC to: [place] Anticipated DC date: 09/28/23 Family notified: Spouse Transport by: [facility]   Per MD patient ready for DC to [place]. RN to call report prior to discharge [phone] room 601p). RN, patient, patient's family, and facility notified of DC. Discharge Summary and FL2 sent to facility. DC packet on chart including signed DNR. Ambulance transport requested for patient.   CSW will sign off for now as social work intervention is no longer needed. Please consult us  again if new needs arise.     Final next level of care: Skilled Nursing Facility Barriers to Discharge: Barriers Resolved   Patient Goals and CMS Choice Patient states their goals for this hospitalization and ongoing recovery are:: Rehab CMS Medicare.gov Compare Post Acute Care list provided to:: Patient Represenative (must comment) Choice offered to / list presented to : Spouse [place] ownership interest in [facility].provided to:: Spouse    Discharge Placement   Existing PASRR number confirmed : 09/28/23          Patient [facility] bed at: [facility] Patient to be transferred to facility by: PTAR Name of family member notified: Spouse Patient and family notified of of transfer: 09/28/23  Discharge Plan and Services Additional resources added to the After Visit Summary for   In-house Referral: Clinical Social Work   Post Acute Care Choice: Skilled Nursing Facility                               Social Drivers of Health (SDOH) Interventions SDOH Screenings   Food Insecurity: No Food Insecurity (09/22/2023)  Housing: Low Risk  (09/22/2023)  Transportation Needs: No Transportation Needs (09/22/2023)  Utilities: Not At Risk  (09/22/2023)  Alcohol Screen: Low Risk  (04/13/2023)  Depression (PHQ2-9): Low Risk  (09/22/2023)  Financial Resource Strain: Low Risk  (04/13/2023)  Physical Activity: Insufficiently Active (04/13/2023)  Social Connections: Moderately Integrated (09/22/2023)  Stress: No Stress Concern Present (04/13/2023)  Tobacco Use: Medium Risk (09/22/2023)  Health Literacy: Adequate Health Literacy (04/13/2023)     Readmission Risk Interventions     No data to display

## 2023-09-28 NOTE — Discharge Summary (Signed)
 Physician Discharge Summary  Jonathan Orozco FMW:[id number] DOB: 30-Mar-1928 DOA: 09/22/2023  PCP: Norleen Lynwood ORN, MD  Admit date: 09/22/2023 Discharge date: 09/28/2023  Admitted From: (Home) Disposition:  (SNF)  Recommendations for Outpatient Follow-up:  Please obtain BMP/CBC in one week  Diet recommendation:  Regular   CODE STATUS: DNR, confirmed by wife  Brief/Interim Summary:  Jonathan Orozco  is a 89 y.o. male, with history of atrial fibrillation on Eliquis , history of ablation and pacemaker placement, CAD s/p CABG, hypertension, dyslipidemia, diverticulosis, colonic polyps, BPPV, BPH who was in good health and lives with his elderly wife, however about 10 days ago while getting out of the bathtub he had a fall hit his head on the sink, at that time EMS came to the house but he refused to come to the hospital for evaluation, subsequently over the last 10 days he has had 2 other falls, has very difficult time getting around the house with a walker as his balance is very poor.    In the ER he was found to be dehydrated, had very poor balance, multiple falls in the setting of Eliquis , living with her elderly wife and unable to care for himself.  Will workup feels significant hyponatremia.      Multiple falls, failure to thrive, dehydration and Hyponatremia.   - Remains significantly frail, deconditioned, PT, OT consulted, will need SNF placement. - Hyponatremia most likely due to his decreased solute intake, he was encouraged to drink Ensure, discussed with wife who encouraged to bring in food from home if he likes .  He was offered Ensure, as well appetite has improved when wife was bringing him food, and required salt tablets as well, sodium much improved, it was 126 on admission, currently at 133 on discharge - TSH within normal limit   Acute metabolic encephalopathy Hospital delirium - Wife describe it as sudden decline in his functional/mental capacity, but upon further reviewing it does  appear he has gradual decline, most likely slowly progressive dementia and cognitive decline. - MRI brain without acute ischemic event, significant for generalized for limb loss due to vascular dementia - Improved low-dose Seroquel  at bedtime - Overall mentation has improved, but his appetite appears poor but he is having good oral intake whenever wife brings him food, mentation is good as long he has a good night sleep and he is more interactive daytime.   History of atrial fibrillation, ablation, pacemaker placement.   - Continue with Eliquis  -  noncompliant with echo exam, so has been canceled for for now   History of CAD with CABG.  -Chest pain-free, EKG is paced, troponin trend is flat and in non-ACS pattern, no chest pain, continue Eliquis  and statin and monitor.     Hypertension.   - Initial blood pressure acceptable, but started to increase, given his hyponatremia and poor oral intake, so losartan  has been discontinued and he was started on hydralazine  instead.    Hypomagnesemia -Replaced   Dyslipidemia.  Continue home dose statin.     Hypokalemia -Replaced   BPH.  Continue Flomax       Discharge Diagnoses:  Principal Problem:   Hyponatremia    Discharge Instructions  Discharge Instructions     Diet - low sodium heart healthy   Complete by: As directed    Discharge instructions   Complete by: As directed    Follow with Primary MD Norleen Lynwood ORN, MD /SNF physician  Get CBC, CMP, checked  by Primary MD next visit.  Activity: As tolerated with Full fall precautions use walker/cane & assistance as needed   Disposition SNF   Diet: Regular diet.   On your next visit with your primary care physician please Get Medicines reviewed and adjusted.   Please request your Prim.MD to go over all Hospital Tests and Procedure/Radiological results at the follow up, please get all Hospital records sent to your Prim MD by signing hospital release before you go  home.   If you experience worsening of your admission symptoms, develop shortness of breath, life threatening emergency, suicidal or homicidal thoughts you must seek medical attention immediately by calling 911 or calling your MD immediately  if symptoms less severe.  You Must read complete instructions/literature along with all the possible adverse reactions/side effects for all the Medicines you take and that have been prescribed to you. Take any new Medicines after you have completely understood and accpet all the possible adverse reactions/side effects.   Do not drive, operating heavy machinery, perform activities at heights, swimming or participation in water activities or provide baby sitting services if your were admitted for syncope or siezures until you have seen by Primary MD or a Neurologist and advised to do so again.  Do not drive when taking Pain medications.    Do not take more than prescribed Pain, Sleep and Anxiety Medications  Special Instructions: If you have smoked or chewed Tobacco  in the last 2 yrs please stop smoking, stop any regular Alcohol  and or any Recreational drug use.  Wear Seat belts while driving.   Please note  You were cared for by a hospitalist during your hospital stay. If you have any questions about your discharge medications or the care you received while you were in the hospital after you are discharged, you can call the unit and asked to speak with the hospitalist on call if the hospitalist that took care of you is not available. Once you are discharged, your primary care physician will handle any further medical issues. Please note that NO REFILLS for any discharge medications will be authorized once you are discharged, as it is imperative that you return to your primary care physician (or establish a relationship with a primary care physician if you do not have one) for your aftercare needs so that they can reassess your need for medications and  monitor your lab values.   Increase activity slowly   Complete by: As directed       Allergies as of 09/28/2023       Reactions   Amiodarone  Hcl Other (See Comments)   Patient reports photosensitivity   Ace Inhibitors Cough        Medication List     STOP taking these medications    losartan  50 MG tablet Commonly known as: COZAAR        TAKE these medications    acetaminophen  325 MG tablet Commonly known as: TYLENOL  Take 2 tablets (650 mg total) by mouth every 6 (six) hours as needed for mild pain (pain score 1-3) or fever (or Fever >/= 101).   atorvastatin  10 MG tablet Commonly known as: LIPITOR TAKE 1 TABLET DAILY   b complex vitamins capsule Take 1 capsule by mouth daily.   Eliquis  5 MG Tabs tablet Generic drug: apixaban  Take 1 tablet (5 mg total) by mouth 2 (two) times daily.   feeding supplement Liqd Take 237 mLs by mouth 3 (three) times daily between meals.   FISH OIL PO Take 1,000 mg by mouth  daily.   hydrALAZINE  50 MG tablet Commonly known as: APRESOLINE  Take 1 tablet (50 mg total) by mouth every 8 (eight) hours.   multivitamin tablet Take 1 tablet by mouth daily.   nitroGLYCERIN  0.4 MG SL tablet Commonly known as: NITROSTAT  Take 1 tablet under your tongue, while sitting.  If no relief of pain may repeat NTG, one tab every 5 minutes up to 3 tablets total over 15 minutes.  If no relief CALL 911. What changed:  when to take this reasons to take this   ofloxacin  0.3 % ophthalmic solution Commonly known as: OCUFLOX  Place 1 drop into both eyes 4 (four) times daily for 3 days.   pantoprazole  40 MG tablet Commonly known as: PROTONIX  Take 1 tablet by mouth once daily What changed:  when to take this reasons to take this   polyethylene glycol 17 g packet Commonly known as: MIRALAX  / GLYCOLAX  Take 17 g by mouth 2 (two) times daily as needed for mild constipation.   QUEtiapine  25 MG tablet Commonly known as: SEROQUEL  Take 1 tablet (25 mg  total) by mouth at bedtime.   tamsulosin  0.4 MG Caps capsule Commonly known as: FLOMAX  Take 1 capsule (0.4 mg total) by mouth in the morning and at bedtime.   traZODone  50 MG tablet Commonly known as: DESYREL  Take 0.5 tablets (25 mg total) by mouth at bedtime as needed for sleep.        Contact information for after-discharge care     Destination     [facility], [place] .   Service: Skilled Nursing Contact information: [address] [place]  [phone]                    Allergies  Allergen Reactions   Amiodarone  Hcl Other (See Comments)    Patient reports photosensitivity   Ace Inhibitors Cough    Consultations: None   Procedures/Studies: MR BRAIN WO CONTRAST Result Date: 09/25/2023 CLINICAL DATA:  Altered mental status beginning about 10 days ago after a fall with trauma to the head. EXAM: MRI HEAD WITHOUT CONTRAST TECHNIQUE: Multiplanar, multiecho pulse sequences of the brain and surrounding structures were obtained without intravenous contrast. COMPARISON:  Head CT 3 days ago. FINDINGS: Brain: Diffusion imaging does not show any acute or subacute infarction or other cause of restricted diffusion. No focal abnormality affects the cerebellum. There is a focus of susceptibility artifact in the right side of the pons which could be due to an old small vessel stroke or a tiny cavernoma. Cerebral hemispheres show generalized age related volume loss without subjective lobar predominance. There are advanced chronic small-vessel ischemic changes affecting the white matter. No cortical or large vessel territory stroke. No mass lesion, acute hemorrhage, structure hydrocephalus or extra-axial collection. Vascular: Major vessels at the base of the brain show flow. Skull and upper cervical spine: Negative Sinuses/Orbits: Clear/normal Other: None IMPRESSION: 1. No acute or reversible finding. Generalized age related volume loss without  subjective lobar predominance. Advanced chronic small-vessel ischemic changes of the cerebral hemispheric white matter. 2. Focus of susceptibility artifact in the right side of the pons which could be due to an old small vessel stroke or a tiny cavernoma. Electronically Signed   By: Oneil Officer M.D.   On: 09/25/2023 13:49   DG Chest 1 View Result Date: 09/22/2023 CLINICAL DATA:  Weakness EXAM: CHEST  1 VIEW COMPARISON:  Chest x-ray 06/03/2016.  CT of the chest 06/12/2014. FINDINGS: Left-sided pacemaker is present.  Patient is status post cardiac surgery. Cardiac silhouette is mildly enlarged. There is central pulmonary vascular congestion. Interstitial opacities are again seen throughout both lungs, not significantly changed. There is no new focal lung consolidation, pleural effusion or pneumothorax. No acute fractures are seen. IMPRESSION: 1. Cardiomegaly with central pulmonary vascular congestion. 2. Interstitial opacities throughout both lungs, not significantly changed. Findings may be related to chronic interstitial lung disease, interstitial edema or infection. Electronically Signed   By: Greig Pique M.D.   On: 09/22/2023 16:11   CT CERVICAL SPINE WO CONTRAST Result Date: 09/22/2023 CLINICAL DATA:  Multiple falls, hit head, anticoagulated EXAM: CT CERVICAL SPINE WITHOUT CONTRAST TECHNIQUE: Multidetector CT imaging of the cervical spine was performed without intravenous contrast. Multiplanar CT image reconstructions were also generated. RADIATION DOSE REDUCTION: This exam was performed according to the departmental dose-optimization program which includes automated exposure control, adjustment of the mA and/or kV according to patient size and/or use of iterative reconstruction technique. COMPARISON:  None Available. FINDINGS: Alignment: There is degenerative anterolisthesis of C2 on C3 and C3 on C4. Otherwise alignment is anatomic. Skull base and vertebrae: No acute fracture. No primary bone lesion or  focal pathologic process. Soft tissues and spinal canal: No prevertebral fluid or swelling. No visible canal hematoma. Disc levels: Diffuse facet hypertrophic changes greatest from C2-3 through C4-5. Bony fusion across the left C4-5 facet. Multilevel spondylosis with disc space narrowing most pronounced from C4-5 through C6-7. Hypertrophic changes at the C1-C2 interface. Upper chest: Airway is patent. Emphysematous changes are seen at the lung apices. Other: Reconstructed images demonstrate no additional findings. IMPRESSION: 1. No acute cervical spine fracture. 2. Multilevel cervical degenerative changes as above. Electronically Signed   By: Ozell Daring M.D.   On: 09/22/2023 15:25   CT HEAD WO CONTRAST ( ) Result Date: 09/22/2023 CLINICAL DATA:  Multiple falls over the last month, hit head, anticoagulated EXAM: CT HEAD WITHOUT CONTRAST TECHNIQUE: Contiguous axial images were obtained from the base of the skull through the vertex without intravenous contrast. RADIATION DOSE REDUCTION: This exam was performed according to the departmental dose-optimization program which includes automated exposure control, adjustment of the mA and/or kV according to patient size and/or use of iterative reconstruction technique. COMPARISON:  None Available. FINDINGS: Brain: Confluent hypodensities are seen throughout the periventricular and subcortical white matter, most compatible with chronic small vessel ischemic changes. No evidence of acute infarct or hemorrhage. Lateral ventricles and remaining midline structures are unremarkable. No acute extra-axial fluid collections. No mass effect. Age-appropriate cerebral cortical atrophy. Vascular: No hyperdense vessel or unexpected calcification. Skull: Normal. Negative for fracture or focal lesion. Sinuses/Orbits: No acute finding. Other: None. IMPRESSION: 1. No acute intracranial process. 2. Chronic small-vessel ischemic changes throughout the white matter and diffuse  age-appropriate cerebral cortical atrophy. Electronically Signed   By: Ozell Daring M.D.   On: 09/22/2023 15:23     Subjective:  No significant events overnight as discussed with staff, he had a good night sleep, he himself denies any complaints, he is eager for discharge Discharge Exam: Vitals:   09/28/23 0357 09/28/23 0833  BP: (!) 161/87 (!) 162/83  Pulse: 70   Resp: 15   Temp: 98 F (36.7 C) 97.7 F (36.5 C)  SpO2:     Vitals:   09/27/23 2020 09/28/23 0013 09/28/23 0357 09/28/23 0833  BP: (!) 153/63 (!) 149/78 (!) 161/87 (!) 162/83  Pulse: 70 70 70   Resp: 15 19 15    Temp: 98.6 F (37 C) 98.2 F (  36.8 C) 98 F (36.7 C) 97.7 F (36.5 C)  TempSrc: Oral Oral Oral Oral  SpO2:      Weight:      Height:        General: Pt is alert, awake, frail, deconditioned, confused Cardiovascular: RRR, S1/S2 +, no rubs, no gallops Respiratory: CTA bilaterally, no wheezing, no rhonchi Abdominal: Soft, NT, ND, bowel sounds + Extremities: no edema, no cyanosis    The results of significant diagnostics from this hospitalization (including imaging, microbiology, ancillary and laboratory) are listed below for reference.     Microbiology: No results found for this or any previous visit (from the past 240 hours).   Labs: BNP (last 3 results) Recent Labs    09/22/23 1713  BNP 135.8*   Basic Metabolic Panel: Recent Labs  Lab 09/23/23 0514 09/24/23 0453 09/25/23 0554 09/26/23 0530 09/27/23 0618  NA 126* 127* 131* 129* 133*  K 3.8 3.4* 4.0 3.6 3.9  CL 92* 93* 99 101 103  CO2 22 24 24 26 23   GLUCOSE 87 108* 97 90 84  BUN 12 12 12 15 15   CREATININE 0.91 0.88 0.94 1.05 0.82  CALCIUM  8.1* 8.2* 8.2* 7.9* 8.2*  MG 1.6* 1.8 1.9 1.9 1.9  PHOS  --  2.9  --   --  2.9   Liver Function Tests: Recent Labs  Lab 09/23/23 0514 09/24/23 0453 09/25/23 0554 09/26/23 0530 09/27/23 0618  AST 44* 43* 36 32 33  ALT 24 26 24 22 18   ALKPHOS 69 71 68 63 64  BILITOT 0.8 0.7 1.0 0.8  1.0  PROT 5.7* 5.7* 5.7* 5.3* 5.4*  ALBUMIN  2.8* 2.7* 2.7* 2.5* 2.5*   No results for input(s): LIPASE, AMYLASE in the last 168 hours. No results for input(s): AMMONIA in the last 168 hours. CBC: Recent Labs  Lab 09/23/23 0514 09/24/23 0453 09/25/23 0554 09/26/23 0530 09/27/23 0618  WBC 5.0 4.1 4.2 3.7* 4.8  NEUTROABS 3.1 1.6* 2.0 1.3* 2.1  HGB 12.9* 13.7 13.4 12.8* 13.7  HCT 36.8* 39.5 39.2 37.6* 39.8  MCV 94.6 94.7 94.2 94.7 94.5  PLT 168 178 177 168 164   Cardiac Enzymes: No results for input(s): CKTOTAL, CKMB, CKMBINDEX, TROPONINI in the last 168 hours. BNP: Invalid input(s): POCBNP CBG: No results for input(s): GLUCAP in the last 168 hours. D-Dimer No results for input(s): DDIMER in the last 72 hours. Hgb A1c No results for input(s): HGBA1C in the last 72 hours. Lipid Profile No results for input(s): CHOL, HDL, LDLCALC, TRIG, CHOLHDL, LDLDIRECT in the last 72 hours. Thyroid  function studies No results for input(s): TSH, T4TOTAL, T3FREE, THYROIDAB in the last 72 hours.  Invalid input(s): FREET3 Anemia work up No results for input(s): VITAMINB12, FOLATE, FERRITIN, TIBC, IRON, RETICCTPCT in the last 72 hours. Urinalysis    Component Value Date/Time   COLORURINE YELLOW 09/22/2023 1504   APPEARANCEUR HAZY (A) 09/22/2023 1504   LABSPEC 1.013 09/22/2023 1504   PHURINE 6.0 09/22/2023 1504   GLUCOSEU NEGATIVE 09/22/2023 1504   GLUCOSEU NEGATIVE 12/17/2022 1412   HGBUR NEGATIVE 09/22/2023 1504   BILIRUBINUR NEGATIVE 09/22/2023 1504   KETONESUR 5 (A) 09/22/2023 1504   PROTEINUR NEGATIVE 09/22/2023 1504   UROBILINOGEN 0.2 12/17/2022 1412   NITRITE NEGATIVE 09/22/2023 1504   LEUKOCYTESUR NEGATIVE 09/22/2023 1504   Sepsis Labs Recent Labs  Lab 09/24/23 0453 09/25/23 0554 09/26/23 0530 09/27/23 0618  WBC 4.1 4.2 3.7* 4.8   Microbiology No results found for this or any previous visit (from the  past 240  hours).   Time coordinating discharge: Over 30 minutes  SIGNED:   Brayton Lye, MD  [facility] 09/28/2023, 11:23 AM Pager   If 7PM-7AM, please contact night-coverage [email or web address]

## 2023-09-28 NOTE — Discharge Instructions (Signed)
 Follow with Primary MD Norleen Lynwood ORN, MD /SNF physician  Get CBC, CMP, checked  by Primary MD next visit.    Activity: As tolerated with Full fall precautions use walker/cane & assistance as needed   Disposition SNF   Diet: Regular diet.   On your next visit with your primary care physician please Get Medicines reviewed and adjusted.   Please request your Prim.MD to go over all Hospital Tests and Procedure/Radiological results at the follow up, please get all Hospital records sent to your Prim MD by signing hospital release before you go home.   If you experience worsening of your admission symptoms, develop shortness of breath, life threatening emergency, suicidal or homicidal thoughts you must seek medical attention immediately by calling 911 or calling your MD immediately  if symptoms less severe.  You Must read complete instructions/literature along with all the possible adverse reactions/side effects for all the Medicines you take and that have been prescribed to you. Take any new Medicines after you have completely understood and accpet all the possible adverse reactions/side effects.   Do not drive, operating heavy machinery, perform activities at heights, swimming or participation in water activities or provide baby sitting services if your were admitted for syncope or siezures until you have seen by Primary MD or a Neurologist and advised to do so again.  Do not drive when taking Pain medications.    Do not take more than prescribed Pain, Sleep and Anxiety Medications  Special Instructions: If you have smoked or chewed Tobacco  in the last 2 yrs please stop smoking, stop any regular Alcohol  and or any Recreational drug use.  Wear Seat belts while driving.   Please note  You were cared for by a hospitalist during your hospital stay. If you have any questions about your discharge medications or the care you received while you were in the hospital after you are  discharged, you can call the unit and asked to speak with the hospitalist on call if the hospitalist that took care of you is not available. Once you are discharged, your primary care physician will handle any further medical issues. Please note that NO REFILLS for any discharge medications will be authorized once you are discharged, as it is imperative that you return to your primary care physician (or establish a relationship with a primary care physician if you do not have one) for your aftercare needs so that they can reassess your need for medications and monitor your lab values.

## 2023-09-28 NOTE — Telephone Encounter (Signed)
 Patient has since had virtual visit with PCP. Advised to call 911 during virtual and patient is currently present at hospital

## 2023-10-16 ENCOUNTER — Telehealth: Payer: Self-pay

## 2023-10-16 NOTE — Transitions of Care (Post Inpatient/ED Visit) (Signed)
   10/16/2023  Name: Jonathan Orozco MRN: [id number] DOB: [birth year]  Today's TOC FU Call Status: Today's TOC FU Call Status:: Unsuccessful Call (1st Attempt) Unsuccessful Call (1st Attempt) Date: 10/16/23  Attempted to reach the patient regarding the most recent Inpatient/ED visit.  Follow Up Plan: Additional outreach attempts will be made to reach the patient to complete the Transitions of Care (Post Inpatient/ED visit) call.   Signature Julian Lemmings, LPN [facility] Nurse Health Advisor Direct Dial [phone]

## 2023-10-19 NOTE — Transitions of Care (Post Inpatient/ED Visit) (Signed)
 10/19/2023  Name: Jonathan Orozco MRN: [id number] DOB: [birth year]  Today's TOC FU Call Status: Today's TOC FU Call Status:: Successful TOC FU Call Completed Unsuccessful Call (1st Attempt) Date: 10/16/23 [facility] FU Call Complete Date: 10/19/23 Patient's Name and Date of Birth confirmed.  Transition Care Management Follow-up Telephone Call Date of Discharge: 10/15/23 Discharge Facility: Other [facility]) Name of Other (Non[facility]) Discharge Facility: [facility] Type of Discharge: Inpatient Admission Primary Inpatient Discharge Diagnosis:: afib How have you been since you were released from the hospital?: Better Any questions or concerns?: No  Items Reviewed: Did you receive and understand the discharge instructions provided?: Yes Medications obtained,verified, and reconciled?: Yes (Medications Reviewed) Any new allergies since your discharge?: No Dietary orders reviewed?: Yes Do you have support at home?: Yes People in Home [RPT]: spouse Name of Support/Comfort Primary Source: caregiver  Medications Reviewed Today: Medications Reviewed Today     Reviewed by Emmitt Pan, LPN (Licensed Practical Nurse) on 10/19/23 at 1240  Med List Status: <None>   Medication Order Taking? Sig Documenting Provider Last Dose Status Informant  acetaminophen  (TYLENOL ) 325 MG tablet [id number] Yes Take 2 tablets (650 mg total) by mouth every 6 (six) hours as needed for mild pain (pain score 1-3) or fever (or Fever >/= 101). Elgergawy, Brayton RAMAN, MD  Active   atorvastatin  (LIPITOR) 10 MG tablet [id number] Yes TAKE 1 TABLET DAILY Norleen Lynwood ORN, MD  Active Spouse/Significant Other, Pharmacy Records  b complex vitamins capsule [id number] Yes Take 1 capsule by mouth daily. [provider]  Active Spouse/Significant Other, Pharmacy Records  ELIQUIS  5 MG TABS tablet [id number] Yes Take 1 tablet (5 mg total) by mouth 2 (two) times daily. Pietro Redell RAMAN, MD  Active Spouse/Significant Other, Pharmacy  Records  feeding supplement (ENSURE PLUS HIGH PROTEIN) LIQD [id number] Yes Take 237 mLs by mouth 3 (three) times daily between meals. Elgergawy, Brayton RAMAN, MD  Active   hydrALAZINE  (APRESOLINE ) 50 MG tablet [id number] Yes Take 1 tablet (50 mg total) by mouth every 8 (eight) hours. Elgergawy, Brayton RAMAN, MD  Active   Multiple Vitamin (MULTIVITAMIN) tablet [id number] Yes Take 1 tablet by mouth daily. [provider]  Active Spouse/Significant Other, Pharmacy Records  nitroGLYCERIN  (NITROSTAT ) 0.4 MG SL tablet [id number] Yes Take 1 tablet under your tongue, while sitting.  If no relief of pain may repeat NTG, one tab every 5 minutes up to 3 tablets total over 15 minutes.  If no relief CALL 911.  Patient taking differently: as needed. Take 1 tablet under your tongue, while sitting.  If no relief of pain may repeat NTG, one tab every 5 minutes up to 3 tablets total over 15 minutes.  If no relief CALL 911.   Dann Candyce RAMAN, MD  Active Spouse/Significant Other, Pharmacy Records  Omega-3 Fatty Acids  (FISH OIL PO) [id number] Yes Take 1,000 mg by mouth daily. [provider]  Active Spouse/Significant Other, Pharmacy Records  pantoprazole  (PROTONIX ) 40 MG tablet [id number] Yes Take 1 tablet by mouth once daily  Patient taking differently: Take 40 mg by mouth daily as needed.   Norleen Lynwood ORN, MD  Active Spouse/Significant Other, Pharmacy Records  polyethylene glycol (MIRALAX  / GLYCOLAX ) 17 g packet [id number] Yes Take 17 g by mouth 2 (two) times daily as needed for mild constipation. Elgergawy, Brayton RAMAN, MD  Active   QUEtiapine  (SEROQUEL ) 25 MG tablet [id number] Yes Take 1 tablet (25 mg total) by mouth at bedtime. Elgergawy, Brayton RAMAN, MD  Active   tamsulosin  (FLOMAX )  0.4 MG CAPS capsule [id number] Yes Take 1 capsule (0.4 mg total) by mouth in the morning and at bedtime. Norleen Lynwood ORN, MD  Active Spouse/Significant Other, Pharmacy Records  traZODone  (DESYREL ) 50 MG tablet [id number] Yes Take 0.5 tablets  (25 mg total) by mouth at bedtime as needed for sleep. Elgergawy, Brayton RAMAN, MD  Active             Home Care and Equipment/Supplies: Were Home Health Services Ordered?: Yes Name of Home Health Agency:: unknown Has Agency set up a time to come to your home?: Yes First Home Health Visit Date: 10/19/23 Any new equipment or medical supplies ordered?: Yes Name of Medical supply agency?: unknown Were you able to get the equipment/medical supplies?: Yes Do you have any questions related to the use of the equipment/supplies?: No  Functional Questionnaire: Do you need assistance with bathing/showering or dressing?: Yes Do you need assistance with meal preparation?: Yes Do you need assistance with eating?: No Do you have difficulty maintaining continence: Yes Do you need assistance with getting out of bed/getting out of a chair/moving?: No Do you have difficulty managing or taking your medications?: Yes  Follow up appointments reviewed: PCP Follow-up appointment confirmed?: Yes Date of PCP follow-up appointment?: 10/27/23 Follow-up Provider: Norleen Do you need transportation to your follow-up appointment?: No Do you understand care options if your condition(s) worsen?: Yes-patient verbalized understanding    SIGNATURE Julian Lemmings, LPN [facility] Nurse Health Advisor Direct Dial [phone]

## 2023-10-21 ENCOUNTER — Ambulatory Visit: Admitting: Podiatry

## 2023-10-27 ENCOUNTER — Encounter: Payer: Self-pay | Admitting: Internal Medicine

## 2023-10-27 ENCOUNTER — Telehealth: Admitting: Internal Medicine

## 2023-10-27 DIAGNOSIS — F411 Generalized anxiety disorder: Secondary | ICD-10-CM | POA: Diagnosis not present

## 2023-10-27 DIAGNOSIS — N3281 Overactive bladder: Secondary | ICD-10-CM | POA: Diagnosis not present

## 2023-10-27 DIAGNOSIS — E871 Hypo-osmolality and hyponatremia: Secondary | ICD-10-CM | POA: Diagnosis not present

## 2023-10-27 DIAGNOSIS — I10 Essential (primary) hypertension: Secondary | ICD-10-CM | POA: Diagnosis not present

## 2023-10-27 DIAGNOSIS — I4891 Unspecified atrial fibrillation: Secondary | ICD-10-CM

## 2023-10-27 MED ORDER — ELIQUIS 5 MG PO TABS: 5.0000 mg | ORAL_TABLET | Freq: Two times a day (BID) | ORAL | 3 refills | Status: AC

## 2023-10-27 MED ORDER — ATORVASTATIN CALCIUM 10 MG PO TABS: 10.0000 mg | ORAL_TABLET | Freq: Every day | ORAL | 3 refills | Status: AC

## 2023-10-27 MED ORDER — PANTOPRAZOLE SODIUM 40 MG PO TBEC: 40.0000 mg | DELAYED_RELEASE_TABLET | Freq: Every day | ORAL | 3 refills | Status: AC

## 2023-10-27 MED ORDER — GEMTESA 75 MG PO TABS: ORAL_TABLET | ORAL | 3 refills | Status: AC

## 2023-10-27 MED ORDER — HYDROXYZINE HCL 10 MG PO TABS: 10.0000 mg | ORAL_TABLET | Freq: Three times a day (TID) | ORAL | 2 refills | Status: AC | PRN

## 2023-10-27 MED ORDER — LOSARTAN POTASSIUM 50 MG PO TABS: 50.0000 mg | ORAL_TABLET | Freq: Every day | ORAL | 3 refills | Status: AC

## 2023-10-27 MED ORDER — LORAZEPAM 0.5 MG PO TABS: ORAL_TABLET | ORAL | 1 refills | Status: AC

## 2023-10-27 MED ORDER — TAMSULOSIN HCL 0.4 MG PO CAPS: 0.4000 mg | ORAL_CAPSULE | Freq: Two times a day (BID) | ORAL | 3 refills | Status: AC

## 2023-10-27 NOTE — Progress Notes (Signed)
 Patient ID: Jonathan Orozco, male   DOB: 04/04/1928, 89 y.o.   MRN: [id number]  Virtual Visit via Video Note  I connected with Jonathan Orozco on 10/27/23 at  2:00 PM EDT by a video enabled telemedicine application and verified that I am speaking with the correct person using two identifiers.  Location of all participants today Patient: at home with wife Provider: at office   I discussed the limitations of evaluation and management by telemedicine and the availability of in person appointments. The patient expressed understanding and agreed to proceed.  CC:  Multiple falls, failure to thrive, dehydration and Hyponatremia with confusion, with hospn aug 19 - 25, then rehab stay at [facility] place, now home for 2 wks.    History of Present Illness: Here to f/u above with wife, after hospn with above requiring IVFs and supportive care, then 2 wk stay at [facility] place.  Currently getting PT at home.  Wife states he is remarkably improved stamina, cognition and balance.  Losartan  stopped at hospn, Bps have been mild elevated at home.  Hydroxyzine  is a miracle med for anxiety it seems, and pt has stopped trazodone , tramadol , seroquel  and hydralazine  from prior to admit.   No further falls.  Has private helped coming total 6 hrs per day, but wife thinks may be able to cut this, seen per [place].  Needs restart losartan .  Wife also asking for start Gemtessa with a new script as she has been taking his med and working very well for her.     Past Medical History:  Diagnosis Date   ABUSE, ALCOHOL, IN REMISSION 04/08/2007   ALLERGIC RHINITIS 09/29/2006   Arthritis    hands (07/02/2016)   Atrial fibrillation (HCC)    BENIGN PROSTATIC HYPERTROPHY 09/29/2006   BPPV (benign paroxysmal positional vertigo) 04/08/2007   CAD (coronary artery disease)    a. LHC 2/17: EF 55-65%, LM 75, pLAD 75, oD1 75, oD2 65, D3 85, oLCx 99, oOM1 75, pRCA 25 >> CABG   Carotid stenosis    a. 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   Diverticulosis    ERECTILE DYSFUNCTION 09/29/2006   GERD (gastroesophageal reflux disease)    GLUCOSE INTOLERANCE 03/19/2010   HYPERLIPIDEMIA 09/29/2006   HYPERTENSION 09/29/2006   Long term (current) use of anticoagulants 04/26/2010   MELANOMA, MALIGNANT, SKIN NOS 09/29/2006   other skin cancers -no further melanoma   OSTEOARTHROSIS NOS, OTHER [facility] SITE 09/29/2006   Other specified forms of hearing loss 08/10/2009   Pneumonia ~ 1969   Presence of permanent cardiac pacemaker 06/02/2016   VENTRICULAR TACHYCARDIA 09/29/2006   Past Surgical History:  Procedure Laterality Date   AV NODE ABLATION N/A 07/02/2016   Procedure: AV Node Ablation;  Surgeon: Fernande Elspeth JAYSON, MD;  Location: [facility] INVASIVE CV LAB;  Service: Cardiovascular;  Laterality: N/A;   CARDIAC CATHETERIZATION N/A 03/12/2015   Procedure: Left Heart Cath and Coronary Angiography;  Surgeon: Victory LELON Sharps, MD;  Location: [facility] INVASIVE CV LAB;  Service: Cardiovascular;  Laterality: N/A;   CARDIOVERSION N/A 12/19/2015   Procedure: CARDIOVERSION;  Surgeon: Oneil JAYSON Parchment, MD;  Location: [facility] ENDOSCOPY;  Service: Cardiovascular;  Laterality: N/A;   CARDIOVERSION N/A 03/24/2016   Procedure: CARDIOVERSION;  Surgeon: Leim VEAR Moose, MD;  Location: [facility] ENDOSCOPY;  Service: Cardiovascular;  Laterality: N/A;   CATARACT EXTRACTION W/ INTRAOCULAR LENS  IMPLANT, BILATERAL Bilateral    CLIPPING OF ATRIAL APPENDAGE N/A 03/14/2015   Procedure: CLIPPING OF ATRIAL APPENDAGE;  Surgeon: Elspeth JAYSON Millers, MD;  Location: [facility] OR;  Service: Open Heart Surgery;  Laterality: N/A;   COLONOSCOPY WITH PROPOFOL  N/A 12/08/2014   Procedure: COLONOSCOPY WITH PROPOFOL ;  Surgeon: Belvie Just, MD;  Location: [facility] ENDOSCOPY;  Service: Endoscopy;  Laterality: N/A;   CORONARY ARTERY BYPASS GRAFT N/A 03/14/2015   Procedure: CORONARY ARTERY BYPASS GRAFTING (CABG)  x 4 (LIMA to LAD, SVG to DIAGONAL 2, SVG SEQUENTIALLY to OM1 and OM2);  Surgeon: Elspeth JAYSON Millers, MD;  Location: [facility] OR;  Service: Open Heart Surgery;  Laterality: N/A;   INCISION AND DRAINAGE N/A 07/03/2016   Procedure: INCISION AND DRAINAGE of CHEST ABSCESS;  Surgeon: Millers Elspeth JAYSON, MD;  Location: [facility] OR;  Service: Thoracic;  Laterality: N/A;   JOINT REPLACEMENT     KNEE ARTHROSCOPY Left    MELANOMA EXCISION     upper back   PACEMAKER IMPLANT N/A 06/02/2016   Procedure: Pacemaker Implant;  Surgeon: Elspeth JAYSON Sage, MD;  Location: [facility] INVASIVE CV LAB;  Service: Cardiovascular;  Laterality: N/A;   SHOULDER OPEN ROTATOR CUFF REPAIR Left    SKIN CANCER EXCISION Right    cheek; real deep   TEE WITHOUT CARDIOVERSION N/A 03/14/2015   Procedure: TRANSESOPHAGEAL ECHOCARDIOGRAM (TEE);  Surgeon: Elspeth JAYSON Millers, MD;  Location: [facility] OR;  Service: Open Heart Surgery;  Laterality: N/A;   TEE WITHOUT CARDIOVERSION N/A 12/19/2015   Procedure: TRANSESOPHAGEAL ECHOCARDIOGRAM (TEE);  Surgeon: Oneil JAYSON Parchment, MD;  Location: [facility] ENDOSCOPY;  Service: Cardiovascular;  Laterality: N/A;   TEE WITHOUT CARDIOVERSION N/A 03/24/2016   Procedure: TRANSESOPHAGEAL ECHOCARDIOGRAM (TEE);  Surgeon: Leim VEAR Moose, MD;  Location: [facility] ENDOSCOPY;  Service: Cardiovascular;  Laterality: N/A;   TONSILLECTOMY     TOTAL KNEE ARTHROPLASTY Right     reports that he quit smoking about 25 years ago. His smoking use included cigarettes. He started smoking about 75 years ago. He has a 50 pack-year smoking history. He has been exposed to tobacco smoke. He has never used smokeless tobacco. He reports that he does not drink alcohol and does not use drugs. family history includes Diabetes in his brother; Esophageal cancer in his father. Allergies  Allergen Reactions   Amiodarone  Hcl Other (See Comments)    Patient reports photosensitivity   Ace Inhibitors Cough   Current Outpatient Medications on File Prior to Visit   Medication Sig Dispense Refill   acetaminophen  (TYLENOL ) 325 MG tablet Take 2 tablets (650 mg total) by mouth every 6 (six) hours as needed for mild pain (pain score 1-3) or fever (or Fever >/= 101).     b complex vitamins capsule Take 1 capsule by mouth daily.     feeding supplement (ENSURE PLUS HIGH PROTEIN) LIQD Take 237 mLs by mouth 3 (three) times daily between meals.     Multiple Vitamin (MULTIVITAMIN) tablet Take 1 tablet by mouth daily.     nitroGLYCERIN  (NITROSTAT ) 0.4 MG SL tablet Take 1 tablet under your tongue, while sitting.  If no relief of pain may repeat NTG, one tab every 5 minutes up to 3 tablets total over 15 minutes.  If no relief CALL 911. (Patient taking differently: as needed. Take 1 tablet under your tongue, while sitting.  If no relief of pain may repeat NTG, one tab every 5 minutes up to 3 tablets total over 15 minutes.  If no relief CALL 911.) 25 tablet 3  Omega-3 Fatty Acids  (FISH OIL PO) Take 1,000 mg by mouth daily.     No current facility-administered medications on file prior to visit.   Observations/Objective: Alert, NAD, appropriate mood and affect, resps normal, cn 2-12 intact, moves all 4s, no visible rash or swelling Lab Results  Component Value Date   WBC 4.8 09/27/2023   HGB 13.7 09/27/2023   HCT 39.8 09/27/2023   PLT 164 09/27/2023   GLUCOSE 84 09/27/2023   CHOL 127 12/17/2022   TRIG 53.0 12/17/2022   HDL 57.60 12/17/2022   LDLDIRECT 159.2 09/29/2006   LDLCALC 59 12/17/2022   ALT 18 09/27/2023   AST 33 09/27/2023   NA 133 (L) 09/27/2023   K 3.9 09/27/2023   CL 103 09/27/2023   CREATININE 0.82 09/27/2023   BUN 15 09/27/2023   CO2 23 09/27/2023   TSH 0.910 09/22/2023   PSA 15.86 (H) 03/04/2022   INR 1.3 (H) 09/22/2023   HGBA1C 5.8 12/17/2022   Assessment and Plan: See notes  Follow Up Instructions: See notes   I discussed the assessment and treatment plan with the patient. The patient was provided an opportunity to ask questions and  all were answered. The patient agreed with the plan and demonstrated an understanding of the instructions.   The patient was advised to call back or seek an in-person evaluation if the symptoms worsen or if the condition fails to improve as anticipated.   Lynwood Rush, MD

## 2023-10-27 NOTE — Patient Instructions (Addendum)
 Ok to restart the losartan  50 mg per day  Please take all new medication as prescribed - the gemtessa for bladder  Please continue all other medications as before, and refills have been done if requested.  Please have the pharmacy call with any other refills you may need.  Please continue your efforts at being more active, low cholesterol diet, and weight control.  You are otherwise up to date with prevention measures today.  Please keep your appointments with your specialists as you may have planned - PT for the excercises  Please make an Appointment to return in 4 months, or sooner if needed

## 2023-10-27 NOTE — Assessment & Plan Note (Signed)
 BP Readings from Last 3 Encounters:  09/28/23 (!) 147/77  09/22/23 120/86  09/18/23 122/70   uncontrolled, pt to restart losartan  50 mg qd

## 2023-10-27 NOTE — Assessment & Plan Note (Signed)
 Pt for Jonathan Orozco 75 mg every day,  to f/u any worsening symptoms or concerns

## 2023-10-27 NOTE — Assessment & Plan Note (Signed)
 Clinically resolved, pt encouraged for continued good hydration and nutrition, and f/u lab next visit

## 2023-10-27 NOTE — Assessment & Plan Note (Signed)
 Much improved, cont atarax  10 mg tid prn

## 2023-10-29 ENCOUNTER — Other Ambulatory Visit (HOSPITAL_COMMUNITY): Payer: Self-pay

## 2023-10-29 ENCOUNTER — Telehealth: Payer: Self-pay

## 2023-10-29 NOTE — Telephone Encounter (Signed)
 Pharmacy Patient Advocate Encounter   Received notification from CoverMyMeds that prior authorization for Pantoprazole  Sodium 40MG  dr tablets is required/requested.   Insurance verification completed.   The patient is insured through [facility] .   Per test claim: Refill too soon. PA is not needed at this time. Medication was filled 10/15/2023. Next eligible fill date is 11/06/2024.

## 2023-11-03 ENCOUNTER — Ambulatory Visit: Admitting: Student in an Organized Health Care Education/Training Program

## 2023-11-10 ENCOUNTER — Telehealth: Payer: Self-pay

## 2023-11-10 ENCOUNTER — Other Ambulatory Visit (HOSPITAL_COMMUNITY): Payer: Self-pay

## 2023-11-10 NOTE — Telephone Encounter (Signed)
 Pharmacy Patient Advocate Encounter   Received notification from Patient Pharmacy that prior authorization for Pantoprazole  40mg  tabs is required/requested.   Insurance verification completed.   The patient is insured through [facility].   Per test claim: PA required; PA submitted to above mentioned insurance via Latent Key/confirmation #/EOC [id number] Status is pending

## 2023-11-10 NOTE — Telephone Encounter (Signed)
 Pharmacy Patient Advocate Encounter  Received notification from [facility] that Prior Authorization for Pantoprazole  40mg  tabs has been APPROVED from 11/10/23 to 11/09/24   PA #/Case ID/Reference #: [id number]

## 2023-11-12 NOTE — Progress Notes (Signed)
 Remote PPM Transmission

## 2023-11-16 ENCOUNTER — Ambulatory Visit (INDEPENDENT_AMBULATORY_CARE_PROVIDER_SITE_OTHER): Payer: Medicare Other

## 2023-11-16 DIAGNOSIS — I4819 Other persistent atrial fibrillation: Secondary | ICD-10-CM

## 2023-11-16 LAB — CUP PACEART REMOTE DEVICE CHECK
Battery Remaining Longevity: 61 mo
Battery Voltage: 2.96 V
Brady Statistic RV Percent Paced: 99.93 %
Date Time Interrogation Session: 20251013015006
Implantable Lead Connection Status: 753985
Implantable Lead Implant Date: 20180430
Implantable Lead Location: 753860
Implantable Lead Model: 5076
Implantable Pulse Generator Implant Date: 20180430
Lead Channel Impedance Value: 437 Ohm
Lead Channel Impedance Value: 570 Ohm
Lead Channel Pacing Threshold Amplitude: 0.625 V
Lead Channel Pacing Threshold Pulse Width: 0.4 ms
Lead Channel Sensing Intrinsic Amplitude: 4.625 mV
Lead Channel Sensing Intrinsic Amplitude: 4.625 mV
Lead Channel Setting Pacing Amplitude: 2.5 V
Lead Channel Setting Pacing Pulse Width: 0.4 ms
Lead Channel Setting Sensing Sensitivity: 1.2 mV
Zone Setting Status: 755011

## 2023-11-18 NOTE — Progress Notes (Signed)
 Remote PPM Transmission

## 2023-12-11 ENCOUNTER — Ambulatory Visit: Payer: Self-pay

## 2023-12-13 ENCOUNTER — Ambulatory Visit: Payer: Self-pay | Admitting: Student in an Organized Health Care Education/Training Program

## 2023-12-16 ENCOUNTER — Telehealth: Payer: Self-pay

## 2023-12-16 NOTE — Telephone Encounter (Signed)
 Ok for verbal

## 2023-12-16 NOTE — Telephone Encounter (Signed)
 Copied from CRM [id number]. Topic: Clinical - Home Health Verbal Orders >> Dec 15, 2023  4:44 PM Charolett L wrote: Caller/Agency: Laura/[facility] Callback Number: [phone] Service Requested: Physical Therapy Frequency: 1 week 4, every other week 4 Any new concerns about the patient? No

## 2023-12-17 ENCOUNTER — Telehealth: Payer: Self-pay

## 2023-12-17 NOTE — Telephone Encounter (Signed)
 Copied from CRM [id number]. Topic: Clinical - Home Health Verbal Orders >> Dec 16, 2023  4:37 PM Vena HERO wrote: Caller/Agency: [facility] Callback Number: [phone] Service Requested: Occupational Therapy Frequency: Once, this week only Any new concerns about the patient? No

## 2023-12-17 NOTE — Telephone Encounter (Signed)
 Called and left voicemail giving verbals.

## 2023-12-17 NOTE — Telephone Encounter (Signed)
 Ok for [employer]

## 2023-12-22 NOTE — Telephone Encounter (Signed)
 Copied from CRM [id number]. Topic: Clinical - Home Health Verbal Orders >> Dec 21, 2023  4:38 PM Dedra NOVAK wrote: Caller/Agency: Teresa, OT from [place] Number: [phone] Service Requested: Occupational Therapy Frequency: 1x/wk for 4 wks Any new concerns about the patient? No

## 2023-12-22 NOTE — Telephone Encounter (Signed)
Ok for verbal for OT

## 2023-12-22 NOTE — Telephone Encounter (Signed)
 Called and left voicemail giving verbals.

## 2024-01-14 ENCOUNTER — Encounter: Admitting: Internal Medicine

## 2024-01-14 ENCOUNTER — Ambulatory Visit: Payer: Self-pay | Admitting: Internal Medicine

## 2024-01-14 ENCOUNTER — Ambulatory Visit: Admitting: Internal Medicine

## 2024-01-14 ENCOUNTER — Encounter: Payer: Self-pay | Admitting: Internal Medicine

## 2024-01-14 VITALS — BP 128/84 | HR 73 | Temp 97.5°F | Ht 70.0 in

## 2024-01-14 DIAGNOSIS — E782 Mixed hyperlipidemia: Secondary | ICD-10-CM

## 2024-01-14 DIAGNOSIS — G8929 Other chronic pain: Secondary | ICD-10-CM

## 2024-01-14 DIAGNOSIS — R296 Repeated falls: Secondary | ICD-10-CM | POA: Insufficient documentation

## 2024-01-14 DIAGNOSIS — H903 Sensorineural hearing loss, bilateral: Secondary | ICD-10-CM

## 2024-01-14 DIAGNOSIS — R197 Diarrhea, unspecified: Secondary | ICD-10-CM | POA: Insufficient documentation

## 2024-01-14 DIAGNOSIS — Z0001 Encounter for general adult medical examination with abnormal findings: Secondary | ICD-10-CM

## 2024-01-14 DIAGNOSIS — R739 Hyperglycemia, unspecified: Secondary | ICD-10-CM

## 2024-01-14 DIAGNOSIS — I10 Essential (primary) hypertension: Secondary | ICD-10-CM

## 2024-01-14 DIAGNOSIS — Z599 Problem related to housing and economic circumstances, unspecified: Secondary | ICD-10-CM | POA: Insufficient documentation

## 2024-01-14 DIAGNOSIS — Z85820 Personal history of malignant melanoma of skin: Secondary | ICD-10-CM | POA: Insufficient documentation

## 2024-01-14 DIAGNOSIS — Z7729 Contact with and (suspected ) exposure to other hazardous substances: Secondary | ICD-10-CM | POA: Insufficient documentation

## 2024-01-14 LAB — BASIC METABOLIC PANEL WITH GFR
BUN: 13 mg/dL (ref 6–23)
CO2: 30 meq/L (ref 19–32)
Calcium: 9.6 mg/dL (ref 8.4–10.5)
Chloride: 98 meq/L (ref 96–112)
Creatinine, Ser: 0.82 mg/dL (ref 0.40–1.50)
GFR: 75.49 mL/min (ref >60.00)
Glucose, Bld: 116 mg/dL — ABNORMAL HIGH (ref 70–99)
Potassium: 4.1 meq/L (ref 3.5–5.1)
Sodium: 134 meq/L — ABNORMAL LOW (ref 135–145)

## 2024-01-14 LAB — CBC WITH DIFFERENTIAL/PLATELET
Basophils Absolute: 0 K/uL (ref 0.0–0.1)
Basophils Relative: 0.4 % (ref 0.0–3.0)
Eosinophils Absolute: 0.1 K/uL (ref 0.0–0.7)
Eosinophils Relative: 1.2 % (ref 0.0–5.0)
HCT: 40.4 % (ref 39.0–52.0)
Hemoglobin: 13.7 g/dL (ref 13.0–17.0)
Lymphocytes Relative: 36.1 % (ref 12.0–46.0)
Lymphs Abs: 2.2 K/uL (ref 0.7–4.0)
MCHC: 33.8 g/dL (ref 30.0–36.0)
MCV: 95.2 fl (ref 78.0–100.0)
Monocytes Absolute: 0.6 K/uL (ref 0.1–1.0)
Monocytes Relative: 9.7 % (ref 3.0–12.0)
Neutro Abs: 3.2 K/uL (ref 1.4–7.7)
Neutrophils Relative %: 52.6 % (ref 43.0–77.0)
Platelets: 218 K/uL (ref 150.0–400.0)
RBC: 4.24 Mil/uL (ref 4.22–5.81)
RDW: 13.2 % (ref 11.5–15.5)
WBC: 6 K/uL (ref 4.0–10.5)

## 2024-01-14 LAB — HEPATIC FUNCTION PANEL
ALT: 14 U/L (ref 0–53)
AST: 19 U/L (ref 0–37)
Albumin: 4 g/dL (ref 3.5–5.2)
Alkaline Phosphatase: 99 U/L (ref 39–117)
Bilirubin, Direct: 0.3 mg/dL (ref 0.0–0.3)
Total Bilirubin: 0.8 mg/dL (ref 0.2–1.2)
Total Protein: 6.9 g/dL (ref 6.0–8.3)

## 2024-01-14 LAB — LIPID PANEL
Cholesterol: 105 mg/dL (ref 0–200)
HDL: 49 mg/dL (ref >39.00)
LDL Cholesterol: 48 mg/dL (ref 0–99)
NonHDL: 56.22
Total CHOL/HDL Ratio: 2
Triglycerides: 42 mg/dL (ref 0.0–149.0)
VLDL: 8.4 mg/dL (ref 0.0–40.0)

## 2024-01-14 LAB — HEMOGLOBIN A1C: Hgb A1c MFr Bld: 5.5 % (ref 4.6–6.5)

## 2024-01-14 LAB — TSH: TSH: 0.78 u[IU]/mL (ref 0.35–5.50)

## 2024-01-14 MED ORDER — HYDROCODONE-ACETAMINOPHEN 5-325 MG PO TABS: 1.0000 | ORAL_TABLET | Freq: Two times a day (BID) | ORAL | 0 refills | Status: AC | PRN

## 2024-01-14 NOTE — Patient Instructions (Signed)
 Your letter will be sent by mail to your home  Please continue all other medications as before, and refills have been done for the hydrocodone   Please have the pharmacy call with any other refills you may need.  Please continue your efforts at being more active, low cholesterol diet, and weight control.  You are otherwise up to date with prevention measures today.  Please keep your appointments with your specialists as you may have planned  Please go to the LAB at the blood drawing area for the tests to be done  You will be contacted by phone if any changes need to be made immediately.  Otherwise, you will receive a letter about your results with an explanation, but please check with MyChart first.  Please make an Appointment to return in 6 months, or sooner if needed

## 2024-01-14 NOTE — Progress Notes (Signed)
 The test results show that your current treatment is OK, as the tests are stable.  Please continue the same plan.  There is no other need for change of treatment or further evaluation based on these results, at this time.  thanks

## 2024-01-14 NOTE — Progress Notes (Unsigned)
 Patient ID: Jonathan Orozco, male   DOB: 03-27-1928, 89 y.o.   MRN: [id number]         Chief Complaint:: wellness exam and chronic pain, bilateral hearing loss, mobility impaired, htn, hld, hyperglycemia       HPI:  Jonathan Orozco is a 89 y.o. male here for wellness exam; for shingrix at pharmacy o/w up to date                        Also Pt continues to have recurring LBP without change in severity, bowel or bladder change, fever, wt loss,  worsening LE pain/numbness/weakness, or falls but spends some days in bed due to pain s/p LS spine fusion.  Pt is markedly mobility impaired though able to get through with a cane, has a walker in the home.  Pt does need a ramp with lift as well as a scooter which are on order at the [facility].  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  Pt denies chest pain, increased sob or doe, wheezing, orthopnea, PND, increased LE swelling, palpitations, dizziness or syncope.   Pt denies polydipsia, polyuria, or new focal neuro s/s.       Wt Readings from Last 3 Encounters:  09/22/23 173 lb 1 oz (78.5 kg)  09/22/23 170 lb (77.1 kg)  09/18/23 175 lb 8 oz (79.6 kg)   BP Readings from Last 3 Encounters:  01/14/24 128/84  09/28/23 (!) 147/77  09/22/23 120/86   Immunization History  Administered Date(s) Administered   Fluad Quad(high Dose 65+) 10/08/2018, 11/10/2019, 10/19/2020, 10/30/2021, 11/07/2022   Fluad Trivalent(High Dose 65+) 11/03/2023   H1N1 03/14/2008   INFLUENZA, HIGH DOSE SEASONAL PF 10/21/2013, 11/20/2015, 11/25/2016, 11/17/2017, 11/07/2022   Influenza Split 11/11/2010, 10/10/2011   Influenza Whole 01/20/2007, 11/02/2007, 03/16/2009, 11/16/2009   Influenza,inj,Quad PF,6+ Mos 11/09/2012   Influenza-Unspecified 11/24/2014   PFIZER Comirnaty(Gray Top)Covid-19 Tri-Sucrose Vaccine 08/21/2020, 10/30/2021    PFIZER(Purple Top)SARS-COV-2 Vaccination 03/17/2019, 04/11/2019, 11/22/2019   Pfizer Covid-19 Vaccine Bivalent Booster 44yrs & up 01/10/2021, 11/07/2022   Pfizer(Comirnaty)Fall Seasonal Vaccine 12 years and older 11/07/2022   Pneumococcal Conjugate-13 05/24/2013   Pneumococcal Polysaccharide-23 03/31/2006   Td 03/31/2006   Tdap 06/19/2016   Zoster Recombinant(Shingrix) 11/03/2023   Health Maintenance Due  Topic Date Due   Zoster Vaccines- Shingrix (2 of 2) 12/29/2023      Past Medical History:  Diagnosis Date   ABUSE, ALCOHOL, IN REMISSION 04/08/2007   ALLERGIC RHINITIS 09/29/2006   Arthritis    hands (07/02/2016)   Atrial fibrillation (HCC)    BENIGN PROSTATIC HYPERTROPHY 09/29/2006   BPPV (benign paroxysmal positional vertigo) 04/08/2007   CAD (coronary artery disease)    a. LHC 2/17: EF 55-65%, LM 75, pLAD 75, oD1 75, oD2 65, D3 85, oLCx 99, oOM1 75, pRCA 25 >> CABG   Carotid stenosis    a. Carotid US  2/17:  Bilateral ICA 1-39% ICA   Chronic lower back pain    COLONIC POLYPS, HX OF 06/23/2007   DISORDERS, ORGANIC INSOMNIA NOS 09/29/2006   Diverticulosis    ERECTILE DYSFUNCTION 09/29/2006   GERD (gastroesophageal reflux disease)    GLUCOSE INTOLERANCE 03/19/2010   HYPERLIPIDEMIA 09/29/2006   HYPERTENSION 09/29/2006   Long term (current) use of anticoagulants 04/26/2010   MELANOMA, MALIGNANT, SKIN NOS 09/29/2006   other skin cancers -  no further melanoma   OSTEOARTHROSIS NOS, OTHER [facility] SITE 09/29/2006   Other specified forms of hearing loss 08/10/2009   Pneumonia ~ 1969   Presence of permanent cardiac pacemaker 06/02/2016   VENTRICULAR TACHYCARDIA 09/29/2006   Past Surgical History:  Procedure Laterality Date   AV NODE ABLATION N/A 07/02/2016   Procedure: AV Node Ablation;  Surgeon: Fernande Elspeth BROCKS, MD;  Location: [facility] INVASIVE CV LAB;  Service: Cardiovascular;  Laterality: N/A;   CARDIAC CATHETERIZATION N/A 03/12/2015   Procedure: Left Heart Cath and Coronary  Angiography;  Surgeon: Victory LELON Sharps, MD;  Location: [facility] INVASIVE CV LAB;  Service: Cardiovascular;  Laterality: N/A;   CARDIOVERSION N/A 12/19/2015   Procedure: CARDIOVERSION;  Surgeon: Oneil BROCKS Parchment, MD;  Location: [facility] ENDOSCOPY;  Service: Cardiovascular;  Laterality: N/A;   CARDIOVERSION N/A 03/24/2016   Procedure: CARDIOVERSION;  Surgeon: Leim VEAR Moose, MD;  Location: [facility] ENDOSCOPY;  Service: Cardiovascular;  Laterality: N/A;   CATARACT EXTRACTION W/ INTRAOCULAR LENS  IMPLANT, BILATERAL Bilateral    CLIPPING OF ATRIAL APPENDAGE N/A 03/14/2015   Procedure: CLIPPING OF ATRIAL APPENDAGE;  Surgeon: Elspeth BROCKS Millers, MD;  Location: [facility] OR;  Service: Open Heart Surgery;  Laterality: N/A;   COLONOSCOPY WITH PROPOFOL  N/A 12/08/2014   Procedure: COLONOSCOPY WITH PROPOFOL ;  Surgeon: Belvie Just, MD;  Location: [facility] ENDOSCOPY;  Service: Endoscopy;  Laterality: N/A;   CORONARY ARTERY BYPASS GRAFT N/A 03/14/2015   Procedure: CORONARY ARTERY BYPASS GRAFTING (CABG) x 4 (LIMA to LAD, SVG to DIAGONAL 2, SVG SEQUENTIALLY to OM1 and OM2);  Surgeon: Elspeth BROCKS Millers, MD;  Location: [facility] OR;  Service: Open Heart Surgery;  Laterality: N/A;   INCISION AND DRAINAGE N/A 07/03/2016   Procedure: INCISION AND DRAINAGE of CHEST ABSCESS;  Surgeon: Millers Elspeth BROCKS, MD;  Location: [facility] OR;  Service: Thoracic;  Laterality: N/A;   JOINT REPLACEMENT     KNEE ARTHROSCOPY Left    MELANOMA EXCISION     upper back   PACEMAKER IMPLANT N/A 06/02/2016   Procedure: Pacemaker Implant;  Surgeon: Elspeth BROCKS Fernande, MD;  Location: [facility] INVASIVE CV LAB;  Service: Cardiovascular;  Laterality: N/A;   SHOULDER OPEN ROTATOR CUFF REPAIR Left    SKIN CANCER EXCISION Right    cheek; real deep   TEE WITHOUT CARDIOVERSION N/A 03/14/2015   Procedure: TRANSESOPHAGEAL ECHOCARDIOGRAM (TEE);  Surgeon: Elspeth BROCKS Millers, MD;  Location: [facility] OR;  Service: Open Heart Surgery;  Laterality: N/A;   TEE WITHOUT CARDIOVERSION N/A 12/19/2015    Procedure: TRANSESOPHAGEAL ECHOCARDIOGRAM (TEE);  Surgeon: Oneil BROCKS Parchment, MD;  Location: [facility] ENDOSCOPY;  Service: Cardiovascular;  Laterality: N/A;   TEE WITHOUT CARDIOVERSION N/A 03/24/2016   Procedure: TRANSESOPHAGEAL ECHOCARDIOGRAM (TEE);  Surgeon: Leim VEAR Moose, MD;  Location: [facility] ENDOSCOPY;  Service: Cardiovascular;  Laterality: N/A;   TONSILLECTOMY     TOTAL KNEE ARTHROPLASTY Right     reports that he quit smoking about 25 years ago. His smoking use included cigarettes. He started smoking about 76 years ago. He has a 50 pack-year smoking history. He has been exposed to tobacco smoke. He has never used smokeless tobacco. He reports that he does not drink alcohol and does not use drugs. family history includes Diabetes in his brother; Esophageal cancer in his father. Allergies[1] Medications Ordered Prior to Encounter[2]      ROS:  All others reviewed and negative.  Objective        PE:  BP 128/84 (BP Location: Right Arm, Patient Position: Sitting, Cuff Size: Normal)  Pulse 73   Temp (!) 97.5 F (36.4 C) (Oral)   Ht 5' 10 (1.778 m)   SpO2 98%   BMI 24.83 kg/m                 Constitutional: Pt appears in chronic pain, unable to stand straight, walks slowly with cane               HENT: Head: NCAT.                Right Ear: External ear normal.                 Left Ear: External ear normal.                Eyes: . Pupils are equal, round, and reactive to light. Conjunctivae and EOM are normal               Nose: without d/c or deformity               Neck: Neck supple. Gross normal ROM               Cardiovascular: Normal rate and regular rhythm.                 Pulmonary/Chest: Effort normal and breath sounds without rales or wheezing.                Abd:  Soft, NT, ND, + BS, no organomegaly               Neurological: Pt is alert. At baseline orientation, motor grossly intact               Skin: Skin is warm. No rashes, no other new lesions, LE edema - none                Psychiatric: Pt behavior is normal without agitation   Micro: none  Cardiac tracings I have personally interpreted today:  none  Pertinent Radiological findings (summarize): none   Lab Results  Component Value Date   WBC 6.0 01/14/2024   HGB 13.7 01/14/2024   HCT 40.4 01/14/2024   PLT 218.0 01/14/2024   GLUCOSE 116 (H) 01/14/2024   CHOL 105 01/14/2024   TRIG 42.0 01/14/2024   HDL 49.00 01/14/2024   LDLDIRECT 159.2 09/29/2006   LDLCALC 48 01/14/2024   ALT 14 01/14/2024   AST 19 01/14/2024   NA 134 (L) 01/14/2024   K 4.1 01/14/2024   CL 98 01/14/2024   CREATININE 0.82 01/14/2024   BUN 13 01/14/2024   CO2 30 01/14/2024   TSH 0.78 01/14/2024   PSA 15.86 (H) 03/04/2022   INR 1.3 (H) 09/22/2023   HGBA1C 5.5 01/14/2024   Assessment/Plan:  Jonathan Orozco is a 89 y.o. White or Caucasian [1] male with  has a past medical history of ABUSE, ALCOHOL, IN REMISSION (04/08/2007), ALLERGIC RHINITIS (09/29/2006), Arthritis, Atrial fibrillation (HCC), BENIGN PROSTATIC HYPERTROPHY (09/29/2006), BPPV (benign paroxysmal positional vertigo) (04/08/2007), CAD (coronary artery disease), Carotid stenosis, Chronic lower back pain, COLONIC POLYPS, HX OF (06/23/2007), DISORDERS, ORGANIC INSOMNIA NOS (09/29/2006), Diverticulosis, ERECTILE DYSFUNCTION (09/29/2006), GERD (gastroesophageal reflux disease), GLUCOSE INTOLERANCE (03/19/2010), HYPERLIPIDEMIA (09/29/2006), HYPERTENSION (09/29/2006), Long term (current) use of anticoagulants (04/26/2010), MELANOMA, MALIGNANT, SKIN NOS (09/29/2006), OSTEOARTHROSIS NOS, OTHER SPEC SITE (09/29/2006), Other specified forms of hearing loss (08/10/2009), Pneumonia (~ 1969), Presence of permanent cardiac pacemaker (06/02/2016), and VENTRICULAR TACHYCARDIA (09/29/2006).  Encounter for well adult exam with abnormal findings Age and  sex appropriate education and counseling updated with regular exercise and diet Referrals for preventative services - none needed Immunizations  addressed - for shingrix today Smoking counseling  - none needed Evidence for depression or other mood disorder - chronic anxiety stable Most recent labs reviewed. I have personally reviewed and have noted: 1) the patient's medical and social history 2) The patient's current medications and supplements 3) The patient's height, weight, and BMI have been recorded in the chart   Encounter for chronic pain management D/w pt and wife, ok for refill hydrocodone  5 325 asd, rov 3 months  Hypertension, uncontrolled BP Readings from Last 3 Encounters:  01/14/24 128/84  09/28/23 (!) 147/77  09/22/23 120/86   Stable, pt to continue medical treatment hydralazine  25 bid, losartan  50 qd   Hyperlipidemia Lab Results  Component Value Date   LDLCALC 48 01/14/2024   Stable, pt to continue current statin lipitor 10 mg qd   Hyperglycemia Lab Results  Component Value Date   HGBA1C 5.5 01/14/2024   Stable, pt to continue current medical treatment  - diet, wt control   Bilateral hearing loss Severe, hearing aids minimally helpful, will have letter today asd to support his [facility] claim  Followup: Return in about 6 months (around 07/14/2024).  Lynwood Rush, MD 01/16/2024 6:41 PM [facility]     [1]  Allergies Allergen Reactions   Amiodarone  Hcl Other (See Comments)    Patient reports photosensitivity   Ace Inhibitors Cough  [2]  Current Outpatient Medications on File Prior to Visit  Medication Sig Dispense Refill   hydrALAZINE  (APRESOLINE ) 25 MG tablet Take 25 mg by mouth 2 (two) times daily.     ketoconazole (NIZORAL) 2 % cream SMARTSIG:1 Application Topical 1 to 2 Times Daily     naproxen (NAPROSYN) 250 MG tablet Take 250 mg by mouth daily as needed.     risperiDONE (RISPERDAL) 0.25 MG tablet Take by mouth.     acetaminophen  (TYLENOL ) 325 MG tablet Take 2 tablets (650 mg total) by mouth every 6 (six) hours as needed  for mild pain (pain score 1-3) or fever (or Fever >/= 101).     atorvastatin  (LIPITOR) 10 MG tablet Take 1 tablet (10 mg total) by mouth daily. 90 tablet 3   b complex vitamins capsule Take 1 capsule by mouth daily.     ELIQUIS  5 MG TABS tablet Take 1 tablet (5 mg total) by mouth 2 (two) times daily. 180 tablet 3   feeding supplement (ENSURE PLUS HIGH PROTEIN) LIQD Take 237 mLs by mouth 3 (three) times daily between meals.     hydrOXYzine  (ATARAX ) 10 MG tablet Take 1 tablet (10 mg total) by mouth 3 (three) times daily as needed. 90 tablet 2   LORazepam  (ATIVAN ) 0.5 MG tablet 1/2 - 1 tab by mouth twice per day as needed 60 tablet 1   losartan  (COZAAR ) 50 MG tablet Take 1 tablet (50 mg total) by mouth daily. 90 tablet 3   Multiple Vitamin (MULTIVITAMIN) tablet Take 1 tablet by mouth daily.     nitroGLYCERIN  (NITROSTAT ) 0.4 MG SL tablet Take 1 tablet under your tongue, while sitting.  If no relief of pain may repeat NTG, one tab every 5 minutes up to 3 tablets total over 15 minutes.  If no relief CALL 911. (Patient taking differently: as needed. Take 1 tablet under your tongue, while sitting.  If no relief of pain may repeat NTG,  one tab every 5 minutes up to 3 tablets total over 15 minutes.  If no relief CALL 911.) 25 tablet 3   Omega-3 Fatty Acids  (FISH OIL PO) Take 1,000 mg by mouth daily.     pantoprazole  (PROTONIX ) 40 MG tablet Take 1 tablet (40 mg total) by mouth daily. 90 tablet 3   tamsulosin  (FLOMAX ) 0.4 MG CAPS capsule Take 1 capsule (0.4 mg total) by mouth in the morning and at bedtime. 180 capsule 3   Vibegron  (GEMTESA ) 75 MG TABS 1 tab by mouth once daily (Patient not taking: Reported on 01/14/2024) 90 tablet 3   No current facility-administered medications on file prior to visit.

## 2024-01-16 ENCOUNTER — Encounter: Payer: Self-pay | Admitting: Internal Medicine

## 2024-01-16 DIAGNOSIS — H9193 Unspecified hearing loss, bilateral: Secondary | ICD-10-CM | POA: Insufficient documentation

## 2024-01-16 NOTE — Assessment & Plan Note (Signed)
 Lab Results  Component Value Date   LDLCALC 48 01/14/2024   Stable, pt to continue current statin lipitor 10 mg qd

## 2024-01-16 NOTE — Assessment & Plan Note (Signed)
 Severe, hearing aids minimally helpful, will have letter today asd to support his VA claim

## 2024-01-16 NOTE — Assessment & Plan Note (Signed)
 BP Readings from Last 3 Encounters:  01/14/24 128/84  09/28/23 (!) 147/77  09/22/23 120/86   Stable, pt to continue medical treatment hydralazine  25 bid, losartan  50 qd

## 2024-01-16 NOTE — Assessment & Plan Note (Signed)
 D/w pt and wife, ok for refill hydrocodone  5 325 asd, rov 3 months

## 2024-01-16 NOTE — Assessment & Plan Note (Signed)
 Age and sex appropriate education and counseling updated with regular exercise and diet Referrals for preventative services - none needed Immunizations addressed - for shingrix today Smoking counseling  - none needed Evidence for depression or other mood disorder - chronic anxiety stable Most recent labs reviewed. I have personally reviewed and have noted: 1) the patient's medical and social history 2) The patient's current medications and supplements 3) The patient's height, weight, and BMI have been recorded in the chart

## 2024-01-16 NOTE — Assessment & Plan Note (Signed)
 Lab Results  Component Value Date   HGBA1C 5.5 01/14/2024   Stable, pt to continue current medical treatment  - diet, wt control

## 2024-01-17 ENCOUNTER — Encounter: Payer: Self-pay | Admitting: Internal Medicine

## 2024-01-18 ENCOUNTER — Telehealth: Payer: Self-pay

## 2024-01-18 ENCOUNTER — Other Ambulatory Visit (HOSPITAL_COMMUNITY): Payer: Self-pay

## 2024-01-18 NOTE — Telephone Encounter (Signed)
 Pharmacy Patient Advocate Encounter   Received notification from Onbase that prior authorization for Norco 5-325 is required/requested.   Insurance verification completed.   The patient is insured through [facility].   Per test claim: PA required; PA submitted to above mentioned insurance via Latent Key/confirmation #/[id number] [facility] Status is pending

## 2024-01-19 ENCOUNTER — Other Ambulatory Visit (HOSPITAL_COMMUNITY): Payer: Self-pay

## 2024-01-20 ENCOUNTER — Encounter (INDEPENDENT_AMBULATORY_CARE_PROVIDER_SITE_OTHER): Payer: Self-pay

## 2024-01-25 ENCOUNTER — Telehealth: Payer: Self-pay

## 2024-01-25 DIAGNOSIS — R058 Other specified cough: Secondary | ICD-10-CM | POA: Insufficient documentation

## 2024-01-25 NOTE — Telephone Encounter (Signed)
 Ok for productive cough    R05.8

## 2024-01-25 NOTE — Telephone Encounter (Signed)
 Copied from CRM [id number]. Topic: Clinical - Prescription Issue >> Jan 25, 2024  2:48 PM Alfonso HERO wrote: Reason for CRM: [facility] called needing Dx code for HYDROcodone -acetaminophen  (NORCO/VICODIN) 5-325 MG tablet.

## 2024-02-12 ENCOUNTER — Telehealth: Payer: Self-pay

## 2024-02-12 NOTE — Telephone Encounter (Signed)
 Ok noted, no new orders   thanks

## 2024-02-12 NOTE — Telephone Encounter (Signed)
 Copied from CRM [id number]. Topic: General - Other >> Feb 12, 2024  3:52 PM Tonda B wrote: Reason for CRM: donn calling saying that pt is being discharged 02/12/2023 please call [phone] ask for laura if you have any questions

## 2024-02-15 ENCOUNTER — Ambulatory Visit: Payer: Medicare Other | Attending: Student in an Organized Health Care Education/Training Program

## 2024-02-15 DIAGNOSIS — I4819 Other persistent atrial fibrillation: Secondary | ICD-10-CM | POA: Diagnosis not present

## 2024-02-16 LAB — CUP PACEART REMOTE DEVICE CHECK
Battery Remaining Longevity: 57 mo
Battery Voltage: 2.96 V
Brady Statistic RV Percent Paced: 99.8 %
Date Time Interrogation Session: 20260112133855
Implantable Lead Connection Status: 753985
Implantable Lead Implant Date: 20180430
Implantable Lead Location: 753860
Implantable Lead Model: 5076
Implantable Pulse Generator Implant Date: 20180430
Lead Channel Impedance Value: 361 Ohm
Lead Channel Impedance Value: 513 Ohm
Lead Channel Pacing Threshold Amplitude: 0.75 V
Lead Channel Pacing Threshold Pulse Width: 0.4 ms
Lead Channel Sensing Intrinsic Amplitude: 2.25 mV
Lead Channel Sensing Intrinsic Amplitude: 2.25 mV
Lead Channel Setting Pacing Amplitude: 2.5 V
Lead Channel Setting Pacing Pulse Width: 0.4 ms
Lead Channel Setting Sensing Sensitivity: 1.2 mV
Zone Setting Status: 755011

## 2024-02-17 ENCOUNTER — Ambulatory Visit: Payer: Self-pay | Admitting: Student in an Organized Health Care Education/Training Program

## 2024-02-19 NOTE — Progress Notes (Signed)
 Remote PPM Transmission

## 2024-04-21 ENCOUNTER — Ambulatory Visit

## 2024-05-12 ENCOUNTER — Ambulatory Visit: Admitting: Nurse Practitioner

## 2024-05-16 ENCOUNTER — Ambulatory Visit

## 2024-08-15 ENCOUNTER — Ambulatory Visit

## 2024-11-14 ENCOUNTER — Ambulatory Visit

## 2025-02-13 ENCOUNTER — Ambulatory Visit

## 2025-05-15 ENCOUNTER — Ambulatory Visit
